# Patient Record
Sex: Female | Born: 1940
Health system: Southern US, Community
[De-identification: ages and names within clinical notes are randomized; demographics above are authoritative.]

## PROBLEM LIST (undated history)

## (undated) DIAGNOSIS — I82401 Acute embolism and thrombosis of unspecified deep veins of right lower extremity: Secondary | ICD-10-CM

## (undated) DIAGNOSIS — I2699 Other pulmonary embolism without acute cor pulmonale: Secondary | ICD-10-CM

## (undated) DIAGNOSIS — D6852 Prothrombin gene mutation: Secondary | ICD-10-CM

## (undated) DIAGNOSIS — Z8719 Personal history of other diseases of the digestive system: Secondary | ICD-10-CM

## (undated) DIAGNOSIS — I10 Essential (primary) hypertension: Secondary | ICD-10-CM

## (undated) DIAGNOSIS — D649 Anemia, unspecified: Secondary | ICD-10-CM

## (undated) DIAGNOSIS — I639 Cerebral infarction, unspecified: Secondary | ICD-10-CM

## (undated) DIAGNOSIS — R0600 Dyspnea, unspecified: Secondary | ICD-10-CM

## (undated) DIAGNOSIS — I739 Peripheral vascular disease, unspecified: Secondary | ICD-10-CM

## (undated) DIAGNOSIS — I509 Heart failure, unspecified: Secondary | ICD-10-CM

## (undated) DIAGNOSIS — K219 Gastro-esophageal reflux disease without esophagitis: Secondary | ICD-10-CM

## (undated) DIAGNOSIS — N189 Chronic kidney disease, unspecified: Secondary | ICD-10-CM

## (undated) DIAGNOSIS — M1990 Unspecified osteoarthritis, unspecified site: Secondary | ICD-10-CM

## (undated) DIAGNOSIS — J189 Pneumonia, unspecified organism: Secondary | ICD-10-CM

## (undated) DIAGNOSIS — D500 Iron deficiency anemia secondary to blood loss (chronic): Secondary | ICD-10-CM

## (undated) DIAGNOSIS — G510 Bell's palsy: Secondary | ICD-10-CM

## (undated) DIAGNOSIS — G2581 Restless legs syndrome: Secondary | ICD-10-CM

## (undated) HISTORY — DX: Iron deficiency anemia secondary to blood loss (chronic): D50.0

## (undated) HISTORY — DX: Prothrombin gene mutation: D68.52

## (undated) HISTORY — PX: COLONOSCOPY: SHX174

## (undated) HISTORY — PX: BACK SURGERY: SHX140

## (undated) HISTORY — PX: SHOULDER SURGERY: SHX246

## (undated) HISTORY — DX: Acute embolism and thrombosis of unspecified deep veins of right lower extremity: I82.401

---
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
## 1999-08-24 ENCOUNTER — Encounter: Payer: Self-pay | Admitting: Family Medicine

## 1999-08-24 ENCOUNTER — Encounter: Admission: RE | Admit: 1999-08-24 | Discharge: 1999-08-24 | Payer: Self-pay | Admitting: Family Medicine

## 1999-10-18 ENCOUNTER — Ambulatory Visit (HOSPITAL_COMMUNITY): Admission: RE | Admit: 1999-10-18 | Discharge: 1999-10-18 | Payer: Self-pay | Admitting: *Deleted

## 1999-10-18 ENCOUNTER — Encounter (INDEPENDENT_AMBULATORY_CARE_PROVIDER_SITE_OTHER): Payer: Self-pay | Admitting: Specialist

## 1999-11-19 ENCOUNTER — Encounter (HOSPITAL_COMMUNITY): Admission: RE | Admit: 1999-11-19 | Discharge: 2000-02-17 | Payer: Self-pay | Admitting: *Deleted

## 1999-12-12 ENCOUNTER — Encounter: Admission: RE | Admit: 1999-12-12 | Discharge: 1999-12-12 | Payer: Self-pay | Admitting: Obstetrics and Gynecology

## 1999-12-12 ENCOUNTER — Encounter: Payer: Self-pay | Admitting: Obstetrics and Gynecology

## 2000-01-08 ENCOUNTER — Encounter: Admission: RE | Admit: 2000-01-08 | Discharge: 2000-04-07 | Payer: Self-pay | Admitting: *Deleted

## 2000-02-25 ENCOUNTER — Encounter (HOSPITAL_COMMUNITY): Admission: RE | Admit: 2000-02-25 | Discharge: 2000-05-25 | Payer: Self-pay | Admitting: *Deleted

## 2001-04-06 ENCOUNTER — Encounter: Admission: RE | Admit: 2001-04-06 | Discharge: 2001-04-06 | Payer: Self-pay | Admitting: Obstetrics and Gynecology

## 2001-04-06 ENCOUNTER — Encounter: Payer: Self-pay | Admitting: Obstetrics and Gynecology

## 2001-07-13 ENCOUNTER — Ambulatory Visit (HOSPITAL_COMMUNITY): Admission: RE | Admit: 2001-07-13 | Discharge: 2001-07-13 | Payer: Self-pay | Admitting: Family Medicine

## 2001-07-13 ENCOUNTER — Encounter: Payer: Self-pay | Admitting: Family Medicine

## 2002-08-08 ENCOUNTER — Encounter: Admission: RE | Admit: 2002-08-08 | Discharge: 2002-08-08 | Payer: Self-pay | Admitting: Orthopedic Surgery

## 2002-08-08 ENCOUNTER — Encounter: Payer: Self-pay | Admitting: Orthopedic Surgery

## 2003-04-29 HISTORY — PX: CARDIAC CATHETERIZATION: SHX172

## 2003-12-20 ENCOUNTER — Ambulatory Visit (HOSPITAL_COMMUNITY): Admission: RE | Admit: 2003-12-20 | Discharge: 2003-12-20 | Payer: Self-pay | Admitting: Hematology & Oncology

## 2004-01-12 ENCOUNTER — Encounter: Admission: RE | Admit: 2004-01-12 | Discharge: 2004-01-12 | Payer: Self-pay | Admitting: Obstetrics and Gynecology

## 2004-01-17 ENCOUNTER — Encounter: Admission: RE | Admit: 2004-01-17 | Discharge: 2004-01-17 | Payer: Self-pay | Admitting: Obstetrics and Gynecology

## 2004-02-02 ENCOUNTER — Ambulatory Visit (HOSPITAL_COMMUNITY): Admission: RE | Admit: 2004-02-02 | Discharge: 2004-02-02 | Payer: Self-pay | Admitting: Neurosurgery

## 2004-02-08 ENCOUNTER — Ambulatory Visit (HOSPITAL_COMMUNITY): Admission: RE | Admit: 2004-02-08 | Discharge: 2004-02-08 | Payer: Self-pay | Admitting: Neurosurgery

## 2004-06-12 ENCOUNTER — Ambulatory Visit: Payer: Self-pay | Admitting: Hematology & Oncology

## 2004-06-17 ENCOUNTER — Inpatient Hospital Stay (HOSPITAL_COMMUNITY): Admission: RE | Admit: 2004-06-17 | Discharge: 2004-06-19 | Payer: Self-pay | Admitting: Neurosurgery

## 2004-12-11 ENCOUNTER — Ambulatory Visit: Payer: Self-pay | Admitting: Hematology & Oncology

## 2005-01-20 ENCOUNTER — Encounter: Admission: RE | Admit: 2005-01-20 | Discharge: 2005-01-20 | Payer: Self-pay | Admitting: Obstetrics and Gynecology

## 2005-01-29 ENCOUNTER — Ambulatory Visit: Payer: Self-pay | Admitting: Hematology & Oncology

## 2005-04-28 DIAGNOSIS — I2699 Other pulmonary embolism without acute cor pulmonale: Secondary | ICD-10-CM

## 2005-04-28 HISTORY — DX: Other pulmonary embolism without acute cor pulmonale: I26.99

## 2005-06-11 ENCOUNTER — Ambulatory Visit: Payer: Self-pay | Admitting: Hematology & Oncology

## 2005-12-10 ENCOUNTER — Ambulatory Visit: Payer: Self-pay | Admitting: Hematology & Oncology

## 2005-12-15 LAB — CBC WITH DIFFERENTIAL/PLATELET
Eosinophils Absolute: 0.2 10*3/uL (ref 0.0–0.5)
HGB: 13.5 g/dL (ref 11.6–15.9)
MCV: 91.5 fL (ref 81.0–101.0)
MONO%: 10.3 % (ref 0.0–13.0)
NEUT#: 2.3 10*3/uL (ref 1.5–6.5)
RBC: 4.28 10*6/uL (ref 3.70–5.32)
RDW: 13 % (ref 11.3–14.5)
WBC: 4.3 10*3/uL (ref 3.9–10.0)
lymph#: 1.3 10*3/uL (ref 0.9–3.3)

## 2005-12-15 LAB — FERRITIN: Ferritin: 56 ng/mL (ref 10–291)

## 2005-12-24 LAB — CBC WITH DIFFERENTIAL/PLATELET
BASO%: 1.4 % (ref 0.0–2.0)
Eosinophils Absolute: 0.3 10*3/uL (ref 0.0–0.5)
HCT: 39.7 % (ref 34.8–46.6)
MCHC: 34.7 g/dL (ref 32.0–36.0)
MONO#: 0.5 10*3/uL (ref 0.1–0.9)
NEUT#: 2.8 10*3/uL (ref 1.5–6.5)
NEUT%: 50.4 % (ref 39.6–76.8)
RBC: 4.31 10*6/uL (ref 3.70–5.32)
WBC: 5.5 10*3/uL (ref 3.9–10.0)
lymph#: 1.8 10*3/uL (ref 0.9–3.3)

## 2005-12-31 LAB — CBC WITH DIFFERENTIAL/PLATELET
Basophils Absolute: 0 10*3/uL (ref 0.0–0.1)
HCT: 32.9 % — ABNORMAL LOW (ref 34.8–46.6)
HGB: 11.5 g/dL — ABNORMAL LOW (ref 11.6–15.9)
MONO#: 0.4 10*3/uL (ref 0.1–0.9)
NEUT%: 55.3 % (ref 39.6–76.8)
WBC: 5.1 10*3/uL (ref 3.9–10.0)
lymph#: 1.6 10*3/uL (ref 0.9–3.3)

## 2006-01-22 ENCOUNTER — Encounter: Admission: RE | Admit: 2006-01-22 | Discharge: 2006-01-22 | Payer: Self-pay | Admitting: Obstetrics and Gynecology

## 2006-06-10 ENCOUNTER — Ambulatory Visit: Payer: Self-pay | Admitting: Hematology & Oncology

## 2006-06-15 LAB — CBC WITH DIFFERENTIAL/PLATELET
Basophils Absolute: 0.1 10*3/uL (ref 0.0–0.1)
Eosinophils Absolute: 0.6 10*3/uL — ABNORMAL HIGH (ref 0.0–0.5)
HCT: 38.8 % (ref 34.8–46.6)
HGB: 13.6 g/dL (ref 11.6–15.9)
LYMPH%: 15.9 % (ref 14.0–48.0)
MCHC: 35 g/dL (ref 32.0–36.0)
MONO#: 0.7 10*3/uL (ref 0.1–0.9)
NEUT#: 4.6 10*3/uL (ref 1.5–6.5)
NEUT%: 64.3 % (ref 39.6–76.8)
Platelets: 151 10*3/uL (ref 145–400)
WBC: 7.1 10*3/uL (ref 3.9–10.0)

## 2006-12-10 ENCOUNTER — Ambulatory Visit: Payer: Self-pay | Admitting: Hematology & Oncology

## 2006-12-14 LAB — CBC WITH DIFFERENTIAL/PLATELET
Basophils Absolute: 0 10*3/uL (ref 0.0–0.1)
EOS%: 3.4 % (ref 0.0–7.0)
Eosinophils Absolute: 0.2 10*3/uL (ref 0.0–0.5)
HCT: 32.1 % — ABNORMAL LOW (ref 34.8–46.6)
HGB: 11.1 g/dL — ABNORMAL LOW (ref 11.6–15.9)
MCH: 31.3 pg (ref 26.0–34.0)
MCV: 90.5 fL (ref 81.0–101.0)
MONO%: 12.7 % (ref 0.0–13.0)
NEUT%: 52.1 % (ref 39.6–76.8)
Platelets: 170 10*3/uL (ref 145–400)

## 2007-02-01 ENCOUNTER — Encounter: Admission: RE | Admit: 2007-02-01 | Discharge: 2007-02-01 | Payer: Self-pay | Admitting: Obstetrics and Gynecology

## 2007-02-08 ENCOUNTER — Encounter: Admission: RE | Admit: 2007-02-08 | Discharge: 2007-02-08 | Payer: Self-pay | Admitting: Obstetrics and Gynecology

## 2007-06-10 ENCOUNTER — Ambulatory Visit: Payer: Self-pay | Admitting: Hematology & Oncology

## 2007-06-14 LAB — CBC WITH DIFFERENTIAL/PLATELET
BASO%: 1 % (ref 0.0–2.0)
EOS%: 6.5 % (ref 0.0–7.0)
HCT: 37.6 % (ref 34.8–46.6)
LYMPH%: 32.4 % (ref 14.0–48.0)
MCH: 29.2 pg (ref 26.0–34.0)
MCHC: 32.5 g/dL (ref 32.0–36.0)
MONO%: 11.2 % (ref 0.0–13.0)
NEUT%: 48.9 % (ref 39.6–76.8)
Platelets: 171 10*3/uL (ref 145–400)

## 2007-06-14 LAB — HEPATIC FUNCTION PANEL
ALT: 12 U/L (ref 0–35)
Indirect Bilirubin: 0.3 mg/dL (ref 0.0–0.9)
Total Protein: 6.6 g/dL (ref 6.0–8.3)

## 2007-06-14 LAB — FERRITIN: Ferritin: 33 ng/mL (ref 10–291)

## 2007-06-14 LAB — AFP TUMOR MARKER: AFP-Tumor Marker: 1.7 ng/mL (ref 0.0–8.0)

## 2007-09-09 ENCOUNTER — Ambulatory Visit: Payer: Self-pay | Admitting: Hematology & Oncology

## 2007-09-16 ENCOUNTER — Encounter: Admission: RE | Admit: 2007-09-16 | Discharge: 2007-09-16 | Payer: Self-pay | Admitting: Orthopedic Surgery

## 2007-12-08 ENCOUNTER — Ambulatory Visit (HOSPITAL_BASED_OUTPATIENT_CLINIC_OR_DEPARTMENT_OTHER): Admission: RE | Admit: 2007-12-08 | Discharge: 2007-12-08 | Payer: Self-pay | Admitting: Emergency Medicine

## 2007-12-09 ENCOUNTER — Ambulatory Visit: Payer: Self-pay | Admitting: Hematology & Oncology

## 2007-12-13 LAB — CBC WITH DIFFERENTIAL (CANCER CENTER ONLY)
Eosinophils Absolute: 0.2 10*3/uL (ref 0.0–0.5)
HCT: 34.8 % (ref 34.8–46.6)
HGB: 11.8 g/dL (ref 11.6–15.9)
LYMPH%: 29.6 % (ref 14.0–48.0)
MCV: 89 fL (ref 81–101)
MONO#: 0.4 10*3/uL (ref 0.1–0.9)
NEUT%: 55.7 % (ref 39.6–80.0)
RBC: 3.92 10*6/uL (ref 3.70–5.32)
WBC: 4.1 10*3/uL (ref 3.9–10.0)

## 2007-12-13 LAB — IRON AND TIBC
Iron: 76 ug/dL (ref 42–145)
UIBC: 175 ug/dL

## 2007-12-13 LAB — FERRITIN: Ferritin: 77 ng/mL (ref 10–291)

## 2007-12-15 LAB — LUPUS ANTICOAGULANT PANEL
DRVVT 1:1 Mix: 40.7 secs (ref 36.1–47.0)
DRVVT: 66.2 secs — ABNORMAL HIGH (ref 36.1–47.0)
PTT Lupus Anticoagulant: 65.3 secs — ABNORMAL HIGH (ref 36.3–48.8)
PTTLA 4:1 Mix: 45.3 secs (ref 36.3–48.8)

## 2007-12-15 LAB — CARDIOLIPIN ANTIBODIES, IGG, IGM, IGA
Anticardiolipin IgA: 9 [APL'U] (ref <13)
Anticardiolipin IgG: <7 [GPL'U] (ref <11)
Anticardiolipin IgM: <7 [MPL'U] (ref <10)

## 2008-03-03 ENCOUNTER — Ambulatory Visit: Payer: Self-pay | Admitting: Hematology & Oncology

## 2008-03-06 LAB — FERRITIN: Ferritin: 45 ng/mL (ref 10–291)

## 2008-05-17 ENCOUNTER — Ambulatory Visit (HOSPITAL_COMMUNITY): Admission: RE | Admit: 2008-05-17 | Discharge: 2008-05-17 | Payer: Self-pay | Admitting: Family Medicine

## 2008-05-24 ENCOUNTER — Encounter: Admission: RE | Admit: 2008-05-24 | Discharge: 2008-05-24 | Payer: Self-pay | Admitting: Family Medicine

## 2008-06-14 ENCOUNTER — Ambulatory Visit: Payer: Self-pay | Admitting: Hematology & Oncology

## 2008-06-15 LAB — PROTIME-INR (CHCC SATELLITE)
INR: 2 (ref 2.0–3.5)
Protime: 24 Seconds — ABNORMAL HIGH (ref 10.6–13.4)

## 2008-06-15 LAB — CBC WITH DIFFERENTIAL (CANCER CENTER ONLY)
Eosinophils Absolute: 0.2 10*3/uL (ref 0.0–0.5)
LYMPH#: 1.5 10*3/uL (ref 0.9–3.3)
LYMPH%: 42.9 % (ref 14.0–48.0)
MCV: 86 fL (ref 81–101)
MONO#: 0.3 10*3/uL (ref 0.1–0.9)
NEUT#: 1.5 10*3/uL (ref 1.5–6.5)
Platelets: 171 10*3/uL (ref 145–400)
RBC: 4.27 10*6/uL (ref 3.70–5.32)
WBC: 3.4 10*3/uL — ABNORMAL LOW (ref 3.9–10.0)

## 2008-06-15 LAB — IRON AND TIBC
%SAT: 56 % — ABNORMAL HIGH (ref 20–55)
Iron: 157 ug/dL — ABNORMAL HIGH (ref 42–145)
UIBC: 121 ug/dL

## 2008-09-05 ENCOUNTER — Encounter: Admission: RE | Admit: 2008-09-05 | Discharge: 2008-09-19 | Payer: Self-pay | Admitting: Orthopedic Surgery

## 2008-11-07 ENCOUNTER — Encounter: Admission: RE | Admit: 2008-11-07 | Discharge: 2008-11-07 | Payer: Self-pay | Admitting: Family Medicine

## 2008-12-06 ENCOUNTER — Ambulatory Visit: Payer: Self-pay | Admitting: Hematology & Oncology

## 2008-12-07 LAB — CBC WITH DIFFERENTIAL (CANCER CENTER ONLY)
Eosinophils Absolute: 0.2 10*3/uL (ref 0.0–0.5)
MCH: 29.8 pg (ref 26.0–34.0)
MONO%: 6.4 % (ref 0.0–13.0)
NEUT#: 2.5 10*3/uL (ref 1.5–6.5)
Platelets: 146 10*3/uL (ref 145–400)
RBC: 4.14 10*6/uL (ref 3.70–5.32)
WBC: 4.4 10*3/uL (ref 3.9–10.0)

## 2008-12-07 LAB — PROTIME-INR (CHCC SATELLITE)
INR: 2.7 (ref 2.0–3.5)
Protime: 32.4 Seconds — ABNORMAL HIGH (ref 10.6–13.4)

## 2008-12-19 ENCOUNTER — Encounter: Admission: RE | Admit: 2008-12-19 | Discharge: 2008-12-19 | Payer: Self-pay | Admitting: Orthopedic Surgery

## 2009-01-12 ENCOUNTER — Ambulatory Visit: Payer: Self-pay | Admitting: Hematology & Oncology

## 2009-01-18 LAB — CBC WITH DIFFERENTIAL (CANCER CENTER ONLY)
Eosinophils Absolute: 0.4 10*3/uL (ref 0.0–0.5)
HCT: 32.1 % — ABNORMAL LOW (ref 34.8–46.6)
LYMPH%: 32.9 % (ref 14.0–48.0)
MCH: 29.3 pg (ref 26.0–34.0)
MCV: 87 fL (ref 81–101)
MONO%: 10.2 % (ref 0.0–13.0)
NEUT%: 49.1 % (ref 39.6–80.0)
Platelets: 247 10*3/uL (ref 145–400)
RBC: 3.67 10*6/uL — ABNORMAL LOW (ref 3.70–5.32)
RDW: 12 % (ref 10.5–14.6)

## 2009-01-18 LAB — PROTIME-INR (CHCC SATELLITE)
INR: 1.4 — ABNORMAL LOW (ref 2.0–3.5)
Protime: 16.8 Seconds — ABNORMAL HIGH (ref 10.6–13.4)

## 2009-05-23 ENCOUNTER — Encounter: Admission: RE | Admit: 2009-05-23 | Discharge: 2009-05-23 | Payer: Self-pay | Admitting: Family Medicine

## 2009-06-06 ENCOUNTER — Ambulatory Visit: Payer: Self-pay | Admitting: Hematology & Oncology

## 2009-06-07 LAB — CBC WITH DIFFERENTIAL (CANCER CENTER ONLY)
BASO#: 0 10*3/uL (ref 0.0–0.2)
BASO%: 0.4 % (ref 0.0–2.0)
Eosinophils Absolute: 0.2 10*3/uL (ref 0.0–0.5)
LYMPH#: 1.3 10*3/uL (ref 0.9–3.3)
MCHC: 33.6 g/dL (ref 32.0–36.0)
MCV: 85 fL (ref 81–101)
MONO%: 7.8 % (ref 0.0–13.0)
WBC: 3.5 10*3/uL — ABNORMAL LOW (ref 3.9–10.0)

## 2009-06-08 LAB — COMPREHENSIVE METABOLIC PANEL
ALT: 14 U/L (ref 0–35)
AST: 23 U/L (ref 0–37)
Alkaline Phosphatase: 108 U/L (ref 39–117)
CO2: 25 mEq/L (ref 19–32)
Glucose, Bld: 79 mg/dL (ref 70–99)
Sodium: 137 mEq/L (ref 135–145)
Total Bilirubin: 0.5 mg/dL (ref 0.3–1.2)

## 2009-06-08 LAB — FERRITIN: Ferritin: 23 ng/mL (ref 10–291)

## 2009-06-08 LAB — VITAMIN D 25 HYDROXY (VIT D DEFICIENCY, FRACTURES): Vit D, 25-Hydroxy: 29 ng/mL — ABNORMAL LOW (ref 30–89)

## 2009-09-18 ENCOUNTER — Ambulatory Visit (HOSPITAL_COMMUNITY): Admission: RE | Admit: 2009-09-18 | Discharge: 2009-09-18 | Payer: Self-pay | Admitting: Neurosurgery

## 2010-01-01 ENCOUNTER — Ambulatory Visit: Payer: Self-pay | Admitting: Hematology & Oncology

## 2010-01-03 LAB — CBC WITH DIFFERENTIAL (CANCER CENTER ONLY)
BASO%: 0.4 % (ref 0.0–2.0)
Eosinophils Absolute: 0.2 10*3/uL (ref 0.0–0.5)
LYMPH#: 1.6 10*3/uL (ref 0.9–3.3)
LYMPH%: 44.4 % (ref 14.0–48.0)
MCHC: 33.6 g/dL (ref 32.0–36.0)
MCV: 89 fL (ref 81–101)
MONO#: 0.4 10*3/uL (ref 0.1–0.9)
MONO%: 10.3 % (ref 0.0–13.0)
NEUT#: 1.4 10*3/uL — ABNORMAL LOW (ref 1.5–6.5)
NEUT%: 39.5 % — ABNORMAL LOW (ref 39.6–80.0)
RBC: 3.89 10*6/uL (ref 3.70–5.32)
WBC: 3.6 10*3/uL — ABNORMAL LOW (ref 3.9–10.0)

## 2010-01-03 LAB — IRON AND TIBC
Iron: 83 ug/dL (ref 42–145)
UIBC: 199 ug/dL

## 2010-01-03 LAB — AFP TUMOR MARKER: AFP-Tumor Marker: <1.3 ng/mL (ref 0.0–8.0)

## 2010-01-03 LAB — FERRITIN: Ferritin: 33 ng/mL (ref 10–291)

## 2010-01-10 ENCOUNTER — Encounter: Admission: RE | Admit: 2010-01-10 | Discharge: 2010-01-10 | Payer: Self-pay | Admitting: Family Medicine

## 2010-05-18 ENCOUNTER — Other Ambulatory Visit: Payer: Self-pay | Admitting: Family Medicine

## 2010-05-18 DIAGNOSIS — Z1239 Encounter for other screening for malignant neoplasm of breast: Secondary | ICD-10-CM

## 2010-05-18 DIAGNOSIS — Z1231 Encounter for screening mammogram for malignant neoplasm of breast: Secondary | ICD-10-CM

## 2010-05-19 ENCOUNTER — Encounter: Payer: Self-pay | Admitting: Obstetrics and Gynecology

## 2010-06-06 ENCOUNTER — Ambulatory Visit
Admission: RE | Admit: 2010-06-06 | Discharge: 2010-06-06 | Disposition: A | Discharge status: Home / Self Care | Payer: MEDICARE | Source: Ambulatory Visit | Attending: Family Medicine | Admitting: Family Medicine

## 2010-06-06 DIAGNOSIS — Z1231 Encounter for screening mammogram for malignant neoplasm of breast: Secondary | ICD-10-CM

## 2010-08-07 ENCOUNTER — Other Ambulatory Visit: Payer: Self-pay | Admitting: Family

## 2010-08-07 ENCOUNTER — Encounter: Payer: MEDICARE | Admitting: Hematology & Oncology

## 2010-08-07 DIAGNOSIS — Z7901 Long term (current) use of anticoagulants: Secondary | ICD-10-CM

## 2010-08-07 DIAGNOSIS — D6859 Other primary thrombophilia: Secondary | ICD-10-CM

## 2010-08-07 LAB — CBC WITH DIFFERENTIAL (CANCER CENTER ONLY)
BASO#: 0 10*3/uL (ref 0.0–0.2)
BASO%: 0.7 % (ref 0.0–2.0)
EOS%: 5.4 % (ref 0.0–7.0)
HCT: 36.9 % (ref 34.8–46.6)
HGB: 12.5 g/dL (ref 11.6–15.9)
LYMPH%: 30.1 % (ref 14.0–48.0)
MCHC: 33.9 g/dL (ref 32.0–36.0)
MCV: 85 fL (ref 81–101)
MONO%: 11.6 % (ref 0.0–13.0)
NEUT%: 52.2 % (ref 39.6–80.0)
RBC: 4.35 10*6/uL (ref 3.70–5.32)
WBC: 4.5 10*3/uL (ref 3.9–10.0)

## 2010-08-07 LAB — FERRITIN: Ferritin: 41 ng/mL (ref 10–291)

## 2010-08-07 LAB — AFP TUMOR MARKER: AFP-Tumor Marker: <1.3 ng/mL (ref 0.0–8.0)

## 2010-08-07 LAB — IRON AND TIBC
%SAT: 49 % (ref 20–55)
Iron: 135 ug/dL (ref 42–145)

## 2010-09-13 NOTE — Op Note (Signed)
[name], [name]               ACCOUNT NO.:  [id number]   MEDICAL RECORD NO.:  [id number]          PATIENT TYPE:  INP   LOCATION:  2899                         FACILITY:  [facility]   PHYSICIAN:  Cristi Loron, M.D.DATE OF BIRTH:  [birth year]   DATE OF PROCEDURE:  06/17/2004  DATE OF DISCHARGE:                                 OPERATIVE REPORT   INDICATIONS FOR PROCEDURE:  The patient is a 71 year old white female who  has suffered from back and right hip pain.  She has failed medical  management and was worked up with a lumbar MRI, as well as a lumbar  myelogram CT which demonstrated that the patient had spinal stenosis at L2-3  and L4-5.  I discussed the various treatment options with the patient,  including surgery.  The patient has weighed the risks, benefits and  alternatives to surgery and would like to proceed with an L3 and L4  laminectomy.   PREOPERATIVE DIAGNOSES:  1.  L3-4 and L4-5 multi-factorial spinal stenosis.  2.  Lumbago.  3.  Lumbar radiculopathy.   POSTOPERATIVE DIAGNOSES:  1.  L3-4 and L4-5 multi-factorial spinal stenosis.  2.  Lumbago.  3.  Lumbar radiculopathy.   PROCEDURE:  L3 and L4 laminectomy with bilateral foraminotomies using micro-  dissection.   SURGEON:  Cristi Loron, M.D.   ASSISTANT:  None.   ANESTHESIA:  General endotracheal.   ESTIMATED BLOOD LOSS:  Was 100 mL.   SPECIMENS:  None.   DRAINS:  None.   COMPLICATIONS:  None.   DESCRIPTION OF PROCEDURE:  The patient was brought to the operating room by  the anesthesia team.  General endotracheal anesthesia was induced.  The  patient was turned to the prone position on a Wilson frame.  The lumbosacral  region was then prepared with Betadine scrub and Betadine solution, and  sterile drapes were applied.  Then I injected the area to be incised with  Marcaine with epinephrine solution.  I used a scalpel to make a linear  midline incision over the L3-4 and L4-5 interspace.  I used  electrocautery  to perform a subperiosteal dissection, exposing the spinous process and  lamina of L3, L4 and L5.  I obtained an intraoperative radiograph to confirm  our location.   We inserted the cerebellar retractors for exposure.  Then used a scalpel to  incise the L2-3, L3-4 and L4-5 interspinous ligaments.  We then used a  Leksell rongeur to remove the interspinous ligament and the spinous process  of L3 and L4.  We then used a high-speed drill to perform a bilateral L3 and  L4 laminotomy.  We brought the operative microscope into the field, and  under its magnification and illumination completed the micro-dissection.  Using micro-dissection to free up the thecal sac and the nerve roots from  the epidural tissue, and then used a Kerrison punch to remove the excess  ligament of flavum from the lateral recesses at L3-4 and L4-5.  I did this  bilaterally and performed a foraminotomy about the bilateral L4 and L5 nerve  roots.  I then palpated  along the ventral surface of the thecal sac and  inspected the inter-vertebral disk at L3-4 and L4-5 bilaterally.  I noted  that the disk was bulging, but there was no herniation, and the thecal sac  and the bilateral L4 and L5 nerve roots were well-decompressed.  We obtained  stringent hemostasis using bipolar electrocautery and copiously irrigated  the wound out with Bacitracin solution, removed the solution, and then  removed the cerebellar retractor.  We reapproximated the patient's  thoracolumbar fascia with interrupted #1 Vicryl suture, the subcutaneous  tissues with interrupted #2-0 Vicryl suture, and the skin with Steri-Strips  and Benzoin.  The wound was then coated with Bacitracin ointment.  A sterile  dressing was applied.  The drapes were removed.   The patient was subsequently returned to a supine position where she was  extubated by the anesthesia team and transported to the post-anesthesia care  unit in stable condition.  All  sponge, instrument and needle counts were  correct at the end of this case.      JDJ/MEDQ  D:  06/17/2004  T:  06/17/2004  Job:  [id number]

## 2010-11-24 ENCOUNTER — Emergency Department (HOSPITAL_COMMUNITY): Payer: Medicare Other

## 2010-11-24 ENCOUNTER — Inpatient Hospital Stay (HOSPITAL_COMMUNITY)
Admission: EM | Admit: 2010-11-24 | Discharge: 2010-11-27 | DRG: 683 | Disposition: A | Discharge status: Home / Self Care | Payer: Medicare Other | Attending: Internal Medicine | Admitting: Internal Medicine

## 2010-11-24 DIAGNOSIS — F3289 Other specified depressive episodes: Secondary | ICD-10-CM | POA: Diagnosis present

## 2010-11-24 DIAGNOSIS — G2581 Restless legs syndrome: Secondary | ICD-10-CM | POA: Diagnosis present

## 2010-11-24 DIAGNOSIS — Z7901 Long term (current) use of anticoagulants: Secondary | ICD-10-CM

## 2010-11-24 DIAGNOSIS — E870 Hyperosmolality and hypernatremia: Secondary | ICD-10-CM | POA: Diagnosis present

## 2010-11-24 DIAGNOSIS — R197 Diarrhea, unspecified: Secondary | ICD-10-CM | POA: Diagnosis present

## 2010-11-24 DIAGNOSIS — N179 Acute kidney failure, unspecified: Principal | ICD-10-CM | POA: Diagnosis present

## 2010-11-24 DIAGNOSIS — E869 Volume depletion, unspecified: Secondary | ICD-10-CM | POA: Diagnosis present

## 2010-11-24 DIAGNOSIS — I959 Hypotension, unspecified: Secondary | ICD-10-CM | POA: Diagnosis present

## 2010-11-24 DIAGNOSIS — I82891 Chronic embolism and thrombosis of other specified veins: Secondary | ICD-10-CM | POA: Diagnosis present

## 2010-11-24 DIAGNOSIS — N200 Calculus of kidney: Secondary | ICD-10-CM | POA: Diagnosis present

## 2010-11-24 DIAGNOSIS — F329 Major depressive disorder, single episode, unspecified: Secondary | ICD-10-CM | POA: Diagnosis present

## 2010-11-24 DIAGNOSIS — D649 Anemia, unspecified: Secondary | ICD-10-CM | POA: Diagnosis present

## 2010-11-24 LAB — CBC
HCT: 33.9 % — ABNORMAL LOW (ref 36.0–46.0)
Hemoglobin: 10.7 g/dL — ABNORMAL LOW (ref 12.0–15.0)
MCH: 30.1 pg (ref 26.0–34.0)
MCHC: 33.9 g/dL (ref 30.0–36.0)
MCHC: 33.5 g/dL (ref 30.0–36.0)
MCV: 88.7 fL (ref 78.0–100.0)
Platelets: 143 10*3/uL — ABNORMAL LOW (ref 150–400)
Platelets: 128 10*3/uL — ABNORMAL LOW (ref 150–400)
RDW: 14.3 % (ref 11.5–15.5)

## 2010-11-24 LAB — URINALYSIS, ROUTINE W REFLEX MICROSCOPIC
Hgb urine dipstick: NEGATIVE
Ketones, ur: 15 mg/dL — AB
Nitrite: NEGATIVE
Urobilinogen, UA: 0.2 mg/dL (ref 0.0–1.0)

## 2010-11-24 LAB — BASIC METABOLIC PANEL
BUN: 78 mg/dL — ABNORMAL HIGH (ref 6–23)
Calcium: 8.8 mg/dL (ref 8.4–10.5)
Calcium: 8.1 mg/dL — ABNORMAL LOW (ref 8.4–10.5)
GFR calc Af Amer: 7 mL/min — ABNORMAL LOW (ref >60)
GFR calc Af Amer: 10 mL/min — ABNORMAL LOW (ref >60)
GFR calc non Af Amer: 6 mL/min — ABNORMAL LOW (ref >60)
GFR calc non Af Amer: 9 mL/min — ABNORMAL LOW (ref >60)
Glucose, Bld: 98 mg/dL (ref 70–99)
Potassium: 5.2 mEq/L — ABNORMAL HIGH (ref 3.5–5.1)
Potassium: 4.7 mEq/L (ref 3.5–5.1)
Sodium: 136 mEq/L (ref 135–145)
Sodium: 136 mEq/L (ref 135–145)

## 2010-11-24 LAB — MRSA PCR SCREENING: MRSA by PCR: NEGATIVE

## 2010-11-24 LAB — DIFFERENTIAL
Basophils Absolute: 0 10*3/uL (ref 0.0–0.1)
Basophils Relative: 0 % (ref 0–1)
Eosinophils Absolute: 0.3 10*3/uL (ref 0.0–0.7)
Eosinophils Absolute: 0.3 10*3/uL (ref 0.0–0.7)
Eosinophils Relative: 5 % (ref 0–5)
Lymphocytes Relative: 29 % (ref 12–46)
Lymphs Abs: 1.5 10*3/uL (ref 0.7–4.0)
Monocytes Absolute: 0.4 10*3/uL (ref 0.1–1.0)
Monocytes Absolute: 0.5 10*3/uL (ref 0.1–1.0)
Monocytes Relative: 8 % (ref 3–12)
Neutro Abs: 2.3 10*3/uL (ref 1.7–7.7)
Neutrophils Relative %: 46 % (ref 43–77)

## 2010-11-24 LAB — PROTEIN / CREATININE RATIO, URINE: Creatinine, Urine: 311.99 mg/dL

## 2010-11-24 LAB — MAGNESIUM: Magnesium: 2.6 mg/dL — ABNORMAL HIGH (ref 1.5–2.5)

## 2010-11-24 LAB — PHOSPHORUS: Phosphorus: 6.7 mg/dL — ABNORMAL HIGH (ref 2.3–4.6)

## 2010-11-25 LAB — BASIC METABOLIC PANEL
CO2: 23 mEq/L (ref 19–32)
Calcium: 7.5 mg/dL — ABNORMAL LOW (ref 8.4–10.5)
Chloride: 108 mEq/L (ref 96–112)
Creatinine, Ser: 3.69 mg/dL — ABNORMAL HIGH (ref 0.50–1.10)
Glucose, Bld: 96 mg/dL (ref 70–99)

## 2010-11-25 LAB — PHOSPHORUS: Phosphorus: 4.9 mg/dL — ABNORMAL HIGH (ref 2.3–4.6)

## 2010-11-25 LAB — CBC
Hemoglobin: 9.9 g/dL — ABNORMAL LOW (ref 12.0–15.0)
MCH: 30 pg (ref 26.0–34.0)
MCV: 89.4 fL (ref 78.0–100.0)
Platelets: 114 10*3/uL — ABNORMAL LOW (ref 150–400)
RBC: 3.3 MIL/uL — ABNORMAL LOW (ref 3.87–5.11)
WBC: 4.2 10*3/uL (ref 4.0–10.5)

## 2010-11-25 LAB — DIFFERENTIAL
Lymphocytes Relative: 41 % (ref 12–46)
Lymphs Abs: 1.7 10*3/uL (ref 0.7–4.0)
Monocytes Relative: 9 % (ref 3–12)
Neutro Abs: 1.9 10*3/uL (ref 1.7–7.7)
Neutrophils Relative %: 45 % (ref 43–77)

## 2010-11-25 LAB — URINE CULTURE
Colony Count: NO GROWTH
Culture  Setup Time: 201207291758

## 2010-11-25 LAB — MAGNESIUM: Magnesium: 2.3 mg/dL (ref 1.5–2.5)

## 2010-11-25 LAB — HEMOGLOBIN A1C: Mean Plasma Glucose: 120 mg/dL — ABNORMAL HIGH (ref <117)

## 2010-11-25 NOTE — H&P (Signed)
[name], [name]               ACCOUNT NO.:  [id number]  MEDICAL RECORD NO.:  [id number]  LOCATION:  [facility]                         FACILITY:  [facility]  PHYSICIAN:  Pleas Koch, MD        DATE OF BIRTH:  September 30, 1940  DATE OF ADMISSION:  11/24/2010 DATE OF DISCHARGE:                             HISTORY & PHYSICAL   PRIMARY CARE PHYSICIAN:  Dr. Holley Bouche.  CHIEF COMPLAINT: 1. Poor passage of urine. 2. Blurred vision and vague dizzy complaints. 3. Recent history of diarrhea in the past 3-4 days.  HISTORY OF PRESENT ILLNESS:  This is a very pleasant 70-year Caucasian female who presents with difficulty passing urine for the past 1-2 days. She states that this is not a new complaint and usually she has to sit toilet for short period of time before she passes urine, was placed what she thinks is hydrochlorothiazide for this by her primary care physician, but then describes over the past 2 days she has passed less than urine despite copious oral intake of fluids in an attempt to do the same.  She does not the patient passed urine at all yesterday.  She has no abdominal pain.  She states that does not any real sensation of fullness, but became concerned enough with this symptom as well as others to come to the hospital.  She also states that she is felt semi-headed on Thursday when she was working in her yard, probably because of heat she felt dizzy and she states that she has had some blurred vision and double vision where she sees a lines of her book and cannot really focus on the lines of the book.  She has had some chills she thinks a couple of weeks ago but no definitive fever.  She has no shortness of breath.  No chest pain.  She does not think she had been contacted.  She just feels woozy not really dizzy.  She admits to having diarrhea for the past 3 days ranges from formed, from semi soft and formed copious to liquidy diarrhea and no specific really foul smell.  She has  not been exposed antibiotics or but she is unclear she has been exposed to any sick people.  She ate at a restaurant last night and then had watery diarrhea.  She had fistula.  PAST MEDICAL HISTORY:  Significant for hemochromatosis, restless legs syndrome, she had a DVT about 3 years ago subsequent to a foot surgery and was on Coumadin for year.  Her hemochromatosis as well as DVT issues followed by Dr. Myna Hidalgo and she was on Coumadin for about a year.  PAST SURGICAL HISTORY:  She had back surgery maybe about 6 years ago.  FAMILY HISTORY:  Her mother had CHF and died age of 50, 3 years ago dad had pancreatic carcinoma likely related to hemochromatosis in that age of 61.  Her sister has significant diabetes mellitus and underwent a BKA.  Her grandfather had a heart attack.  She also carries a history of hypertension as well as mitral valve prolapse.  ALLERGIES:  She is allergic to PENICILLIN, SULFA, and MORPHINE. Penicillin and sulfa makes her sore throat.  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  Her husband name is, Elmer, cell phone number [phone].  She does not have any advanced directives and when questioned about the same she is not clear.  Right now (she is a full code).  PHYSICAL EXAMINATION:  VITAL SIGNS:  Initially temperature 97.5 in the ED, blood pressure was initially 83/41, then went to 98/51, pulse is 54, respirations 20, and O2 sats 98% on room air.  She is not on any pain. GENERAL:  She is a very pleasant Caucasian female, looking younger than stated age. HEENT:  Pupils are dilated.  I am not able to appreciate fundus.  She has no pallor.  No icterus.  I would do appreciate mild venous distention bilaterally. CHEST:  Clinically clear.  No tactile vocal resonance or fremitus.  S1 and S2.  No murmurs, rubs, possible murmur at the left sternal edge.  No PMI displaced. ABDOMEN:  Slightly obese, nontender, nondistended.  Bowel sounds  are heard. EXTREMITIES:  Soft, nonswollen, nontender. NEUROLOGIC:  She is grossly intact.  Her gait was not assessed.  WORKUP IN THE ED: 1. Urinalysis/CT of the head for the dizziness which showed no acute     intracranial abnormality.  A tiny of lacunar infarct in the left     thalamus.  CBC on November 24, 2010 which showed, her CBG showed a     white count 5.3, hemoglobin 11.5, hematocrit 33.9, platelet count     143.  Her BEMT showed, a sodium 136, potassium 5.2, chloride 97,     CO2 of 22, glucose 105, BUN 78, creatinine 6.62, and calcium 8.8.  ASSESSMENT AND PLAN: 1. This is a 70 year old female with possible new onset acute kidney     injury likely multifactorial secondary to possible use of blood     pressure medications in the setting of diarrhea which may be     secondary to volume depletion, although she does not seem that way     clinically.  The other possibility of course is that she has     urinary obstruction as evidenced by the fact that she put out about     300 mL of urine in the ED after being catheterized after 24-hour     period of time.  She definitely oligo and anuric and I do believe     further imaging is warranted.  I will get a Renal ultrasound of the     kidneys to assess for possible obstructive disease and if need be I     will also consult Urology for this.  We will give her copious     volume at 250 mL/ hr.  The patient does not have any history of CHF     and hopefully her urine will pick up.  We will repeat labs in the     morning.  I have done limited workup including a spot urine for     protein creatinine, urine sodium as well as urine osmolality to     determine the urine and determine what type of renal insufficiency     she has and we will review her in the morning. 2. Hypotension, this is likely secondary to volume depletion but this     could be also secondary to possible septic picture, although the     patient is afebrile as such as her vitals  are not hemodynamically     completely stable.  I will  keep her on the Step-Down Unit today and     review her in the morning.  I have had an EKG done today which     shows a heart rate above 54, PR interval of about 0.12, axis of     about 45 good R-wave progression across precordial leads and no     peaked T-waves.  I got this EKG because she had mild hyperkalemia. 3  Hyperkalemia, likely secondary to acute renal insufficiency, we will get Kayexalate onboard 15 g which I thought.emergency department physician's orders stat.  She will continue this.  I will repeat another BMET in about 6 hours and review her kidney function at that time we will make a determination of need for urology to been involved.  1. Anemia, unclear what the etiology of this is, if her hemoglobin     dropped down further although it is only mildly decreased.  We will     get an anemia panel, in addition that she also has a Myoview low     platelet count for which we will keep on SCDs. 2. Questionable diabetes mellitus.  She has a fasting glucose 105 and     we will get HbA1c in the morning.  I have discussed completely the course care with the patient and we will determine on day-to-day basis.  She does I will update family as needed.          ______________________________ Pleas Koch, MD     JS/MEDQ  D:  11/24/2010  T:  11/24/2010  Job:  [id number]  cc:   Holley Bouche, M.D.  Electronically Signed by Pleas Koch MD on 11/25/2010 10:19:56 PM

## 2010-11-26 LAB — BASIC METABOLIC PANEL
BUN: 32 mg/dL — ABNORMAL HIGH (ref 6–23)
CO2: 26 mEq/L (ref 19–32)
Chloride: 109 mEq/L (ref 96–112)
Glucose, Bld: 86 mg/dL (ref 70–99)
Potassium: 4.9 mEq/L (ref 3.5–5.1)
Sodium: 141 mEq/L (ref 135–145)

## 2010-11-26 LAB — COMPREHENSIVE METABOLIC PANEL
AST: 17 U/L (ref 0–37)
Albumin: 2.8 g/dL — ABNORMAL LOW (ref 3.5–5.2)
BUN: 38 mg/dL — ABNORMAL HIGH (ref 6–23)
Creatinine, Ser: 1.44 mg/dL — ABNORMAL HIGH (ref 0.50–1.10)
Total Protein: 5.8 g/dL — ABNORMAL LOW (ref 6.0–8.3)

## 2010-11-26 LAB — CBC
HCT: 30.2 % — ABNORMAL LOW (ref 36.0–46.0)
MCHC: 33.4 g/dL (ref 30.0–36.0)
MCV: 89.6 fL (ref 78.0–100.0)
Platelets: 107 10*3/uL — ABNORMAL LOW (ref 150–400)
RDW: 14.2 % (ref 11.5–15.5)
WBC: 3.3 10*3/uL — ABNORMAL LOW (ref 4.0–10.5)

## 2010-11-26 NOTE — Discharge Summary (Signed)
[name], [name]               ACCOUNT NO.:  [id number]  MEDICAL RECORD NO.:  [id number]  LOCATION:  6705                         FACILITY:  [facility]  PHYSICIAN:  Pleas Koch, MD        DATE OF BIRTH:  [birth year]  DATE OF ADMISSION:  11/24/2010 DATE OF DISCHARGE:                              DISCHARGE SUMMARY   DISCHARGE DIAGNOSES: 1. Likely prerenal azotemia secondary to low blood pressure versus     likely prerenal azotemia secondary to increased sweating. 2. Vague episodes of blurred vision, now resolved. 3. Hypotension, likely contributory to the acute kidney injury and     secondary to volume depletion, antihypertensives currently on hold. 4. Anemia, history of hemochromatosis follows up with Dr. Myna Hidalgo. 5. History of prior deep vein thrombosis, now on Coumadin. 6. A1c of 5.8. 7. Hyperkalemia, cause undefined and likely secondary to prerenal     azotemia.  DISCHARGE MEDICATIONS:  These will be reconciled by discharging physician may include, 1. Amlodipine 5 daily. 2. Alprazolam 0.25 daily. 3. Calcium carbonate 1 tab daily. 4. Citalopram 20 at bedtime. 5. Pantoprazole 40 q.12 h. 6. Gabapentin 75 daily. 7. Ropinirole 4 mg daily. 8. Temazepam 30 at bedtime. 9. Vitamin E 100 b.i.d.  Please note, I have held her metoprolol and her lisinopril pending primary care physician reassessment.  IMAGING DATA:  Renal ultrasound showing small stone lower pole of the left kidney, otherwise normal exam.  CT head showing no acute intracranial abnormality with tiny old lacunar infarct left thalamus.  CONSULTATIONS:  Mark C. Vernie Ammons, M.D. with Urology.  Please see my full dictation [phone] on admission.  HISTORY OF PRESENT ILLNESS:  This is briefly a 70 year old female complaining of difficulty passing urine who describes oliguria, anuria, and passing less urine despite copious intake of fluids.  She also felt lightheaded working in the garden, felt slightly dizzy and  blurred vision, double vision despite being seen recently by her optometrist who gave her the all clear.  No shortness breath or chest pain.  No other issues.  Having diarrhea for couple of days, which has now resolved.  HOSPITAL COURSE: 1. AKI, likely secondary to volume depletion.  Acute kidney injury was     likely multifactorial.  The FENA pointed to this being prerenal.     She is now passing copious amounts of fluids and over the past 24     hours, has put out about 1200 mL.  I will discontinue IV fluids if     not done ready.  Continue good p.o. intake and the patient can be     reviewed by rounding physician, Dr. Jomarie Longs tomorrow.  If the     patient's potassium is normalized and her creatinine goes down to     her baseline which is down to baseline below 1, I feel it would be     reasonable to send her home.  Certainly, her creatinine has gone     from 6.6 on admission to currently 1.44. 2. Hyperkalemia, cause undefined.  Her hyperkalemia is mild and     despite and we have given her 1 one dose of Kayexalate.  On  telemonitor, she is currently off monitors.  However, I will place     her on monitors to make sure there are no other issues.  I will get     an EKG just to make sure there is no hyperkalemia and we will     review her in the morning.  I feel it is reasonable to repeat the     potassium and if her potassium is normalized, we can likely     discharge her home in the morning.  Certainly, this could be     because of transcellular shift, so decrease GFR likely secondary to     the oliguric or anuric ATI that she experienced. 3. Hypotension.  We will hold completely her metoprolol and     lisinopril.  Her blood pressure is acceptable at this time,     currently on Norvasc which will be continued until outpatient     physician sees her. 4. Restless legs syndrome.  She is requesting her usual dose of     ropinirole 4 and is currently on 3.  We will accept that and      reconcile those orders. 5. Depression.  She will continue on her citalopram. 6. Anemia and history of chromatosis.  The patient can follow with Dr.     Myna Hidalgo for the same.  Her hemoglobin has trended slightly down     from 11.5 to 9.9, however, remained stable today at 10.1.  No     further workup is needed at this point.  Her platelets are in the     range of 100-115, but that is normal for her.  The patient was seen today, was doing well, had complete resolution of blurred vision.  No nausea, no vomiting, has been getting up and going to the bathroom without issue.  PHYSICAL EXAMINATION:  CHEST:  Clinically clear. ABDOMEN:  Soft.  S1, S2.  No murmurs. EXTREMITIES:  No lower extremity edema.  Please see discharge dictation.  It was a pleasure taking care of your patient.          ______________________________ Pleas Koch, MD     JS/MEDQ  D:  11/26/2010  T:  11/26/2010  Job:  [id number]  cc:   Holley Bouche, M.D.  Electronically Signed by Pleas Koch MD on 11/26/2010 10:22:46 PM

## 2010-11-27 LAB — RENAL FUNCTION PANEL
Albumin: 2.9 g/dL — ABNORMAL LOW (ref 3.5–5.2)
BUN: 26 mg/dL — ABNORMAL HIGH (ref 6–23)
CO2: 26 mEq/L (ref 19–32)
Chloride: 109 mEq/L (ref 96–112)
Glucose, Bld: 82 mg/dL (ref 70–99)
Potassium: 5.1 mEq/L (ref 3.5–5.1)

## 2010-12-01 NOTE — Consult Note (Signed)
[name], [name]               ACCOUNT NO.:  [id number]  MEDICAL RECORD NO.:  [id number]  LOCATION:                                 FACILITY:  PHYSICIAN:  Monicka Cyran C. Vernie Ammons, M.D.  DATE OF BIRTH:  28-Sep-1940  DATE OF CONSULTATION:  11/25/2010 DATE OF DISCHARGE:                                CONSULTATION   [name] is a 70 year old female who I was asked to see regarding possible urinary retention.  The patient states that a little over 48 hours ago, on Friday, she was out picking potatoes.  It was extremely hot and she said she is a heavy sweater and said she sweated so much that all of her clothing was soaked.  She recalls urinating that evening, but the following morning, on Saturday, she noted that she did not urinate like she normally does in the morning and did not urinate throughout the day.  The following day, on Sunday, she was presented to the emergency room because she felt she was not urinating and presented for further evaluation.  She was not complaining of any suprapubic discomfort or flank pain at that time.  She does report that she had some diarrhea with some abdominal cramping as well.  She then became lightheaded.  She is on a beta-blocker and has no significant past urologic history, specifically she has no history of urinary retention or any form of voiding dysfunction.  She has had UTIs in the past, but those were rare and the last one was over a year ago.  She also has a history of a renal calculus disease but has not had difficulty with that for a number of years.  She presented to the emergency room where a Foley catheter was placed and it was recorded that 200-300 mL of dark amber urine returned.  She was found to have an elevated creatinine of 6.63 and admitted to the hospital.  I was contacted regarding possible urinary retention.  PAST MEDICAL HISTORY:  Positive for: 1. Hypertension. 2. Past history of pulmonary embolism. 3. DVT. 4. Spinal  stenosis. 5. Hemochromatosis. 6. Calculus disease.  PAST SURGICAL HISTORY:  She has had back surgery.  MEDICATIONS:  On admission, propranolol, Prilosec, amlodipine, Lyrica, ropinirole, citalopram, omeprazole, temazepam, alprazolam, calcium, vitamin E, and multivitamins.  ALLERGIES:  PENICILLIN and SULFA.  SOCIAL HISTORY:  She does not smoke or drink.  FAMILY HISTORY:  Negative for GU malignancy or renal disease.  REVIEW OF SYSTEMS:  As noted above, otherwise, her 13-point review of systems is negative.  PHYSICAL EXAMINATION:  VITAL SIGNS:  Her blood pressure is 103/46, pulse 76, respirations 18.  She is afebrile. HEENT:  Atraumatic, normocephalic.  Oropharynx clear. NECK:  Supple with midline trachea. CHEST:  Regular rate and rhythm. ABDOMEN:  Soft and nontender without mass or HSM.  She has no CVAT. SKIN:  Warm and dry. EXTREMITIES:  Without clubbing, cyanosis, or edema. NEUROLOGIC:  She has no gross focal neurologic deficits and specifically no lower extremity weakness or numbness noted.  She is alert, oriented with appropriate mood and affect.  LABORATORY RESULTS ON ADMISSION:  Her urinalysis had a specific gravity of 1.024.  White count  was 5.3, hemoglobin 11.5, hematocrit 33.9. Sodium 136, potassium 5.2, BUN was 78, creatinine 6.63.  Renal ultrasound images were independently reviewed.  The kidneys are free of any mass and specifically, no hydronephrosis noted.  There is a hyperechoic area in the lower pole of the left kidney consistent with a stone with a posterior shadowing.  I reviewed his CT scan done in May 2011, which revealed a punctate right renal stone in the lower pole with no evidence of obstruction and a stone in the lower pole of the left kidney that measured approximately 3 mm in size.  Reviewing her previous CT scans, I note there is no change in the 3-mm stone in the lower pole of her left kidney compared to a CT scan done in 2006.  IMPRESSION: 1.  No evidence whatsoever of urinary retention.  The 200-300 mL in her     bladder is normal and physiologic in a patient with significant     dehydration.  She appears to have a combination of both some renal     but more likely prerenal azotemia.  She is responding to fluid     resuscitation.  Her urine output is picking up.  Her creatinine is     improving and is now down to 3.69.  I do not feel further Foley     catheterization is necessary at this time. 2. Bilateral nephrolithiasis.  These have been stable over the years     and no specific therapy is necessary.  RECOMMENDATIONS: 1. Discontinue Foley catheter at this time. 2. Continue treatment of her azotemia with fluids and monitoring     electrolytes. 3. Please contact me if further urologic assistance is needed,     otherwise, I will sign off.     Yanel Dombrosky C. Vernie Ammons, M.D.     MCO/MEDQ  D:  11/25/2010  T:  11/25/2010  Job:  [id number]  Electronically Signed by Ihor Gully M.D. on 12/01/2010 05:13:05 PM

## 2011-01-08 ENCOUNTER — Encounter (HOSPITAL_BASED_OUTPATIENT_CLINIC_OR_DEPARTMENT_OTHER): Payer: Medicare Other | Admitting: Hematology & Oncology

## 2011-01-08 ENCOUNTER — Other Ambulatory Visit: Payer: Self-pay | Admitting: Hematology & Oncology

## 2011-01-08 LAB — COMPREHENSIVE METABOLIC PANEL
ALT: 14 U/L (ref 0–35)
AST: 24 U/L (ref 0–37)
Alkaline Phosphatase: 97 U/L (ref 39–117)
Calcium: 9.7 mg/dL (ref 8.4–10.5)
Chloride: 100 mEq/L (ref 96–112)
Creatinine, Ser: 1.19 mg/dL — ABNORMAL HIGH (ref 0.50–1.10)
Total Bilirubin: 0.4 mg/dL (ref 0.3–1.2)

## 2011-01-08 LAB — CBC WITH DIFFERENTIAL (CANCER CENTER ONLY)
BASO#: 0 10*3/uL (ref 0.0–0.2)
BASO%: 0.6 % (ref 0.0–2.0)
EOS%: 4.6 % (ref 0.0–7.0)
HCT: 35 % (ref 34.8–46.6)
HGB: 12.3 g/dL (ref 11.6–15.9)
LYMPH%: 31.7 % (ref 14.0–48.0)
MCHC: 35.1 g/dL (ref 32.0–36.0)
MCV: 87 fL (ref 81–101)
MONO#: 0.5 10*3/uL (ref 0.1–0.9)
NEUT%: 52 % (ref 39.6–80.0)
RDW: 13.1 % (ref 11.1–15.7)

## 2011-01-08 LAB — IRON AND TIBC
%SAT: 26 % (ref 20–55)
TIBC: 266 ug/dL (ref 250–470)
UIBC: 197 ug/dL (ref 125–400)

## 2011-01-24 LAB — PROTIME-INR
INR: 3 — ABNORMAL HIGH
Prothrombin Time: 33.3 — ABNORMAL HIGH

## 2011-07-02 ENCOUNTER — Telehealth: Payer: Self-pay | Admitting: Hematology & Oncology

## 2011-07-02 NOTE — Telephone Encounter (Signed)
Pt moved 3-20 to 4-15 she could only come on monday or thursday.

## 2011-07-16 ENCOUNTER — Other Ambulatory Visit: Payer: Medicare Other | Admitting: Lab

## 2011-07-16 ENCOUNTER — Ambulatory Visit: Payer: Medicare Other | Admitting: Hematology & Oncology

## 2011-08-11 ENCOUNTER — Other Ambulatory Visit (HOSPITAL_BASED_OUTPATIENT_CLINIC_OR_DEPARTMENT_OTHER): Payer: Medicare Other | Admitting: Lab

## 2011-08-11 ENCOUNTER — Ambulatory Visit (HOSPITAL_BASED_OUTPATIENT_CLINIC_OR_DEPARTMENT_OTHER): Payer: Medicare Other | Admitting: Hematology & Oncology

## 2011-08-11 DIAGNOSIS — I82409 Acute embolism and thrombosis of unspecified deep veins of unspecified lower extremity: Secondary | ICD-10-CM

## 2011-08-11 DIAGNOSIS — R52 Pain, unspecified: Secondary | ICD-10-CM

## 2011-08-11 DIAGNOSIS — M7989 Other specified soft tissue disorders: Secondary | ICD-10-CM

## 2011-08-11 DIAGNOSIS — Z86718 Personal history of other venous thrombosis and embolism: Secondary | ICD-10-CM

## 2011-08-11 LAB — CBC WITH DIFFERENTIAL (CANCER CENTER ONLY)
BASO#: 0 10*3/uL (ref 0.0–0.2)
Eosinophils Absolute: 0.2 10*3/uL (ref 0.0–0.5)
HGB: 12.8 g/dL (ref 11.6–15.9)
MONO#: 0.4 10*3/uL (ref 0.1–0.9)
NEUT#: 1.5 10*3/uL (ref 1.5–6.5)
Platelets: 168 10*3/uL (ref 145–400)
RBC: 4.36 10*6/uL (ref 3.70–5.32)
WBC: 3.4 10*3/uL — ABNORMAL LOW (ref 3.9–10.0)

## 2011-08-11 NOTE — Progress Notes (Signed)
This office note has been dictated.

## 2011-08-12 NOTE — Progress Notes (Signed)
CC:   Holley Bouche, M.D.  DIAGNOSES: 1. 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 2. History of DVT, prothrombin II gene mutation positive.  CURRENT THERAPY:  Phlebotomy to maintain ferritin less than 100.  INTERVAL HISTORY:  [name] comes in for followup.  We see her every 6 months.  Since we last saw her, she has been doing okay.  She has had issues with her kidneys.  She was admitted, I think, back last July actually with renal failure.  This was from dehydration.  She does have pain issues.  She has had back surgery.  She has had some occasional leg swelling.  She has had no obvious bleeding.  She has had no fever.  When we last saw her in September, her ferritin was only 30 with an iron saturation of 26%.  She has had no obvious change in her medications.  PHYSICAL EXAM:  This is a well-developed, well-nourished white female in no obvious distress.  Vital signs:  96.8, pulse 67, respiratory rate 20, blood pressure 117/71.  Weight is 162.  Head and neck exam shows a normocephalic, atraumatic skull.  There are no ocular or oral lesions. There are no palpable cervical or supraclavicular lymph nodes.  Lungs: Clear to percussion and auscultation bilaterally.  Cardiac: Regular rate and rhythm with a normal S1 and S2.  There are no murmurs, rubs or bruits.  Abdomen:  Soft with good bowel sounds.  There is no palpable abdominal mass.  There is no palpable hepatosplenomegaly. Back:  No tenderness over the spine, ribs, or hips.  She does have a lumbar laminectomy scar that is well healed.  Extremities:  Some trace nonpitting edema in her legs.  Neurologic:  No focal neurological deficits.  LABORATORY STUDIES:  White cell count is 3.4, hemoglobin 12.8, hematocrit 37.6, platelet count 168.  MCV is 86.  IMPRESSION:  [name] is a 71 year old white female with history of hemochromatosis.  Again, she is homozygous for the major C282T mutation. We have not had to  phlebotomize since I have been seeing her.  I have been seeing her now for probably about 8 years.  We will continue to monitor her. We see her every 6 months.  I do not see need for any kind of x-ray tests right now.  Her last mammogram was done back in February 2012.  She says she is going to have another 1 done soon.  I will see her back in 6 months.  We last checked her alpha-fetoprotein back in April 2012.  It was less than 1.3.    ______________________________ Josph Macho, M.D. PRE/MEDQ  D:  08/11/2011  T:  08/12/2011  Job:  [id number]

## 2011-08-15 LAB — COMPREHENSIVE METABOLIC PANEL
Albumin: 4 g/dL (ref 3.5–5.2)
CO2: 31 mEq/L (ref 19–32)
Calcium: 9.8 mg/dL (ref 8.4–10.5)
Chloride: 100 mEq/L (ref 96–112)
Glucose, Bld: 91 mg/dL (ref 70–99)
Potassium: 3.4 mEq/L — ABNORMAL LOW (ref 3.5–5.3)
Sodium: 141 mEq/L (ref 135–145)
Total Protein: 6.9 g/dL (ref 6.0–8.3)

## 2011-08-15 LAB — HEMOCHROMATOSIS DNA-PCR(C282Y,H63D)

## 2011-08-15 LAB — IRON AND TIBC
Iron: 61 ug/dL (ref 42–145)
UIBC: 198 ug/dL (ref 125–400)

## 2011-08-20 ENCOUNTER — Telehealth: Payer: Self-pay | Admitting: *Deleted

## 2011-08-20 NOTE — Telephone Encounter (Signed)
Message copied by Anselm Jungling on Wed Aug 20, 2011  2:39 PM ------      Message from: Arlan Organ R      Created: Wed Aug 13, 2011  2:05 PM       Call - iron is still good!!!  No phlebotomy

## 2011-08-20 NOTE — Telephone Encounter (Signed)
Called patient to let her know that her iron levels were good per dr. Ennever. Left message on personal answering machine. 

## 2011-08-22 ENCOUNTER — Telehealth: Payer: Self-pay | Admitting: *Deleted

## 2011-08-22 NOTE — Telephone Encounter (Addendum)
Message copied by Mirian Capuchin on Fri Aug 22, 2011 12:02 PM ------      Message from: Arlan Organ R      Created: Wed Aug 13, 2011  2:05 PM       Call - iron is still good!!!  No phlebotomy  Called patient and let her know that labs are good. ST

## 2012-01-29 ENCOUNTER — Other Ambulatory Visit (HOSPITAL_BASED_OUTPATIENT_CLINIC_OR_DEPARTMENT_OTHER): Payer: Medicare Other | Admitting: Lab

## 2012-01-29 ENCOUNTER — Ambulatory Visit (HOSPITAL_BASED_OUTPATIENT_CLINIC_OR_DEPARTMENT_OTHER): Payer: Medicare Other | Admitting: Hematology & Oncology

## 2012-01-29 DIAGNOSIS — I82409 Acute embolism and thrombosis of unspecified deep veins of unspecified lower extremity: Secondary | ICD-10-CM

## 2012-01-29 DIAGNOSIS — D6852 Prothrombin gene mutation: Secondary | ICD-10-CM

## 2012-01-29 LAB — CBC WITH DIFFERENTIAL (CANCER CENTER ONLY)
BASO#: 0 10*3/uL (ref 0.0–0.2)
Eosinophils Absolute: 0.1 10*3/uL (ref 0.0–0.5)
HCT: 36.7 % (ref 34.8–46.6)
HGB: 12.5 g/dL (ref 11.6–15.9)
MCH: 29.7 pg (ref 26.0–34.0)
MONO%: 10.5 % (ref 0.0–13.0)
NEUT#: 3.3 10*3/uL (ref 1.5–6.5)
RBC: 4.21 10*6/uL (ref 3.70–5.32)

## 2012-01-29 LAB — IRON AND TIBC
%SAT: 17 % — ABNORMAL LOW (ref 20–55)
Iron: 40 ug/dL — ABNORMAL LOW (ref 42–145)
UIBC: 192 ug/dL (ref 125–400)

## 2012-01-29 NOTE — Progress Notes (Signed)
This office note has been dictated.

## 2012-01-30 NOTE — Progress Notes (Signed)
CC:   [name], M.D.  DIAGNOSES: 1. Hemochromatosis (homozygous for capital C282Y mutation). 2. History of a deep vein thrombosis, prothrombin 2 gene mutation.  CURRENT THERAPY:  Phlebotomy to maintain ferritin less than 100.  INTERIM HISTORY:  [name] comes in for followup.  When we saw her 6 months ago.  She has lost 24 pounds since we last saw her.  She has been trying to lose a little weight.  When we last saw her, her ferritin was only 39.  We have not had to phlebotomize her for quite awhile.  She has been having some problem with cramps in her legs.  She is on hydrochlorothiazide.  She was given potassium but is not taking it.  She still has some back discomfort.  This more so over in the I think left sacroiliac region.  She has not noted any problems with leg swelling.  She has had no nausea or vomiting.  There has been no cough or shortness of breath.  PHYSICAL EXAMINATION:  This is a well-developed, well-nourished white female in no obvious distress.  Vital signs:  Temperature of 97.7, pulse 54, respiratory rate 18, blood pressure 152/65.  Weight is 138.  Head and neck:  Normocephalic, atraumatic skull.  There are no ocular or oral lesions.  There are no palpable cervical or supraclavicular lymph nodes. Lungs:  Clear bilaterally.  Cardiac:  Regular rate and rhythm with a normal S1 and S2.  There are no murmurs, rubs or bruits.  Abdomen:  Soft with good bowel sounds.  There is no palpable abdominal mass.  There is no fluid wave.  No palpable hepatosplenomegaly.  Back:  No tenderness over the spine, ribs, or hips.  Extremities:  No clubbing, cyanosis or edema.  LABORATORY STUDIES:  White cell count is 5.3, hemoglobin 12.5, hematocrit 36.7, platelet count 174.  IMPRESSION:  [name] is a 71 year old female with hemochromatosis. Again, she is homozygous for the C282Y mutation.  I do not think that iron overload will ever be a problem for her.  We will  continue to get her back every 6 months.  We will call her with the results of her iron.   ______________________________ Josph Macho, M.D. PRE/MEDQ  D:  01/29/2012  T:  01/30/2012  Job:  [id number]

## 2012-02-16 ENCOUNTER — Telehealth: Payer: Self-pay | Admitting: Hematology & Oncology

## 2012-02-16 NOTE — Telephone Encounter (Addendum)
Message copied by Cathi Roan on Mon Feb 16, 2012  9:09 AM ------      Message from: [place], [place] N      Created: Wed Feb 04, 2012 12:03 PM                   ----- Message -----         From: Josph Macho, MD         Sent: 02/02/2012   7:22 AM           To: Baldomero Lamy, RN            Amy:            Call her and let her know that the iron is good.  Her ferritin is 54 so she does not need a phlebotomy.            Thanks!!!            Cindee Lame  02-16-12, 9:15   Called and spoke to  [place] on cell phone, informed her of above MD message. Information acknowledged by patient.  Lupita Raider LPN

## 2012-03-23 ENCOUNTER — Other Ambulatory Visit: Payer: Self-pay | Admitting: Family Medicine

## 2012-03-23 DIAGNOSIS — Z1231 Encounter for screening mammogram for malignant neoplasm of breast: Secondary | ICD-10-CM

## 2012-04-06 ENCOUNTER — Other Ambulatory Visit: Payer: Self-pay | Admitting: Family Medicine

## 2012-04-06 DIAGNOSIS — M545 Low back pain: Secondary | ICD-10-CM

## 2012-04-10 ENCOUNTER — Ambulatory Visit
Admission: RE | Admit: 2012-04-10 | Discharge: 2012-04-10 | Disposition: A | Discharge status: Home / Self Care | Payer: Medicare Other | Source: Ambulatory Visit | Attending: Family Medicine | Admitting: Family Medicine

## 2012-04-10 DIAGNOSIS — M545 Low back pain: Secondary | ICD-10-CM

## 2012-04-10 MED ORDER — GADOBENATE DIMEGLUMINE 529 MG/ML IV SOLN
13.0000 mL | Freq: Once | INTRAVENOUS | Status: AC | PRN
  Administered 2012-04-10: 13 mL via INTRAVENOUS

## 2012-04-12 ENCOUNTER — Ambulatory Visit: Payer: Medicare Other

## 2012-04-15 ENCOUNTER — Ambulatory Visit (HOSPITAL_COMMUNITY): Payer: Medicare Other

## 2012-05-04 ENCOUNTER — Ambulatory Visit (HOSPITAL_COMMUNITY): Payer: Medicare Other

## 2012-05-10 ENCOUNTER — Ambulatory Visit (HOSPITAL_COMMUNITY): Payer: Medicare Other

## 2012-05-18 ENCOUNTER — Ambulatory Visit (HOSPITAL_COMMUNITY)
Admission: RE | Admit: 2012-05-18 | Discharge: 2012-05-18 | Disposition: A | Discharge status: Home / Self Care | Payer: Medicare Other | Source: Ambulatory Visit | Attending: Family Medicine | Admitting: Family Medicine

## 2012-05-18 DIAGNOSIS — Z1231 Encounter for screening mammogram for malignant neoplasm of breast: Secondary | ICD-10-CM | POA: Insufficient documentation

## 2012-06-21 ENCOUNTER — Other Ambulatory Visit (HOSPITAL_COMMUNITY)
Admission: RE | Admit: 2012-06-21 | Discharge: 2012-06-21 | Disposition: A | Discharge status: Home / Self Care | Payer: Medicare Other | Source: Ambulatory Visit | Attending: Obstetrics and Gynecology | Admitting: Obstetrics and Gynecology

## 2012-06-21 ENCOUNTER — Other Ambulatory Visit: Payer: Self-pay | Admitting: Obstetrics and Gynecology

## 2012-06-21 DIAGNOSIS — Z124 Encounter for screening for malignant neoplasm of cervix: Secondary | ICD-10-CM | POA: Insufficient documentation

## 2012-06-21 DIAGNOSIS — Z1151 Encounter for screening for human papillomavirus (HPV): Secondary | ICD-10-CM | POA: Insufficient documentation

## 2012-07-29 ENCOUNTER — Ambulatory Visit (HOSPITAL_BASED_OUTPATIENT_CLINIC_OR_DEPARTMENT_OTHER): Payer: Medicare Other | Admitting: Medical

## 2012-07-29 ENCOUNTER — Other Ambulatory Visit (HOSPITAL_BASED_OUTPATIENT_CLINIC_OR_DEPARTMENT_OTHER): Payer: Medicare Other | Admitting: Lab

## 2012-07-29 DIAGNOSIS — Z86718 Personal history of other venous thrombosis and embolism: Secondary | ICD-10-CM

## 2012-07-29 DIAGNOSIS — I82409 Acute embolism and thrombosis of unspecified deep veins of unspecified lower extremity: Secondary | ICD-10-CM

## 2012-07-29 DIAGNOSIS — D6852 Prothrombin gene mutation: Secondary | ICD-10-CM

## 2012-07-29 LAB — IRON AND TIBC
%SAT: 24 % (ref 20–55)
Iron: 70 ug/dL (ref 42–145)
TIBC: 289 ug/dL (ref 250–470)

## 2012-07-29 LAB — CBC WITH DIFFERENTIAL (CANCER CENTER ONLY)
BASO%: 0.9 % (ref 0.0–2.0)
HCT: 37.1 % (ref 34.8–46.6)
LYMPH#: 1.9 10*3/uL (ref 0.9–3.3)
MONO#: 0.4 10*3/uL (ref 0.1–0.9)
Platelets: 189 10*3/uL (ref 145–400)
RDW: 15.2 % (ref 11.1–15.7)
WBC: 4.3 10*3/uL (ref 3.9–10.0)

## 2012-07-29 NOTE — Progress Notes (Signed)
DIAGNOSES: 1. Hemochromatosis (homozygous for capital C282Y mutation). 2. History of a deep vein thrombosis, prothrombin 2 gene mutation.  CURRENT THERAPY:  Phlebotomy to maintain ferritin less than 100.  INTERIM HISTORY: [name] presents today for an office followup visit.  Overall, she, reports, that she's doing relatively well.  She has joined [employer] again.  She, states she's gained some weight.  Back.  When we last saw her 6 months ago, her ferritin was only 54.  We'll like to keep her ferritin below 100.  She still continues to have some chronic issues with her back.  She, reports, that she saw all an orthopedic surgeon.  Last week, who informed her that she has 2 bulging discs, as well as scoliosis and disc degeneration.  She still able to get around quite well.  She, reports, a good appetite.  She denies any nausea, vomiting, diarrhea, constipation, chest pain, shortness breath, or cough she denies any fevers, chills, or night sweats, any lower leg swelling.  She denies any type of abdominal pain.  She denies any obvious, or normal, bleeding.  She denies any headaches, visual changes, or rashes.   Review of Systems: Constitutional:Negative for malaise/fatigue, fever, chills, weight loss, diaphoresis, activity change, appetite change, and unexpected weight change.  HEENT: Negative for double vision, blurred vision, visual loss, ear pain, tinnitus, congestion, rhinorrhea, epistaxis sore throat or sinus disease, oral pain/lesion, tongue soreness Respiratory: Negative for cough, chest tightness, shortness of breath, wheezing and stridor.  Cardiovascular: Negative for chest pain, palpitations, leg swelling, orthopnea, PND, DOE or claudication Gastrointestinal: Negative for nausea, vomiting, abdominal pain, diarrhea, constipation, blood in stool, melena, hematochezia, abdominal distention, anal bleeding, rectal pain, anorexia and hematemesis.  Genitourinary: Negative for dysuria,  frequency, hematuria,  Musculoskeletal: Negative for myalgias, back pain, joint swelling, arthralgias and gait problem.  Skin: Negative for rash, color change, pallor and wound.  Neurological:. Negative for dizziness/light-headedness, tremors, seizures, syncope, facial asymmetry, speech difficulty, weakness, numbness, headaches and paresthesias.  Hematological: Negative for adenopathy. Does not bruise/bleed easily.  Psychiatric/Behavioral:  Negative for depression, no loss of interest in normal activity or change in sleep pattern.   Physical Exam: This is a pleasant, 72 year old, well-developed, well-nourished, white female, in no obvious distress Vitals:`, Temperature 97.8 degrees, pulse 53, respirations 16, blood pressure 140/63, weight 153 pounds HEENT reveals a normocephalic, atraumatic skull, no scleral icterus, no oral lesions  Neck is supple without any cervical or supraclavicular adenopathy.  Lungs are clear to auscultation bilaterally. There are no wheezes, rales or rhonci Cardiac is regular rate and rhythm with a normal S1 and S2. There are no murmurs, rubs, or bruits.  Abdomen is soft with good bowel sounds, there is no palpable mass. There is no palpable hepatosplenomegaly. There is no palpable fluid wave.  Musculoskeletal no tenderness of the spine, ribs, or hips.  Extremities there are no clubbing, cyanosis, or edema.  Skin no petechia, purpura or ecchymosis Neurologic is nonfocal.  Laboratory Data: White count 0.3, hemoglobin 12.4, hematocrit 37.1, platelets 189,000  Current Outpatient Prescriptions on File Prior to Visit  Medication Sig Dispense Refill  . ALPRAZolam (XANAX) 0.25 MG tablet at bedtime as needed.       Marland Kitchen amLODipine (NORVASC) 5 MG tablet every morning.      . Calcium Polycarbophil (FIBERCON PO) Take by mouth 2 (two) times daily.      . calcium-vitamin D (OSCAL WITH D) 250-125 MG-UNIT per tablet Take 1 tablet by mouth 2 (two) times daily.      Marland Kitchen  cholecalciferol  (VITAMIN D) 1000 UNITS tablet Take 1,000 Units by mouth 2 (two) times daily before a meal.      . citalopram (CELEXA) 20 MG tablet daily after supper.       . cyclobenzaprine (FLEXERIL) 10 MG tablet As needed      . fish oil-omega-3 fatty acids 1000 MG capsule Take 2 g by mouth daily.      . hydrochlorothiazide (MICROZIDE) 12.5 MG capsule every morning.      Marland Kitchen omeprazole (PRILOSEC) 20 MG capsule every morning.      . propranolol (INDERAL) 10 MG tablet 2 (two) times daily.      . temazepam (RESTORIL) 30 MG capsule 30 mg at bedtime as needed.       . vitamin E 400 UNIT capsule Take 400 Units by mouth daily.       No current facility-administered medications on file prior to visit.  `  Assessment/Plan: This is a pleasant, 73 year old, white female, with the following issues:  #1.  Hemochromatosis.  Again, she is homozygous for the C282Y mutation.  She does not need a phlebotomy today.  Her ferritin level has remained below 100.  We have not had to worry about any type of iron overload.  We will continue to monitor her ferritin level.  #2.  Followup.  We will follow back up with Ms. Perrow in 6 months, but before then should there be questions or concerns.

## 2012-11-09 ENCOUNTER — Encounter (HOSPITAL_COMMUNITY): Payer: Self-pay | Admitting: Emergency Medicine

## 2012-11-09 ENCOUNTER — Emergency Department (HOSPITAL_COMMUNITY)
Admission: EM | Admit: 2012-11-09 | Discharge: 2012-11-09 | Disposition: A | Discharge status: Home / Self Care | Payer: Medicare Other | Attending: Emergency Medicine | Admitting: Emergency Medicine

## 2012-11-09 ENCOUNTER — Emergency Department (HOSPITAL_COMMUNITY): Payer: Medicare Other

## 2012-11-09 DIAGNOSIS — Z79899 Other long term (current) drug therapy: Secondary | ICD-10-CM | POA: Insufficient documentation

## 2012-11-09 DIAGNOSIS — W19XXXA Unspecified fall, initial encounter: Secondary | ICD-10-CM | POA: Insufficient documentation

## 2012-11-09 DIAGNOSIS — Y939 Activity, unspecified: Secondary | ICD-10-CM | POA: Insufficient documentation

## 2012-11-09 DIAGNOSIS — S99929A Unspecified injury of unspecified foot, initial encounter: Secondary | ICD-10-CM | POA: Insufficient documentation

## 2012-11-09 DIAGNOSIS — I10 Essential (primary) hypertension: Secondary | ICD-10-CM | POA: Insufficient documentation

## 2012-11-09 DIAGNOSIS — Z86718 Personal history of other venous thrombosis and embolism: Secondary | ICD-10-CM | POA: Insufficient documentation

## 2012-11-09 DIAGNOSIS — IMO0002 Reserved for concepts with insufficient information to code with codable children: Secondary | ICD-10-CM | POA: Insufficient documentation

## 2012-11-09 DIAGNOSIS — M67912 Unspecified disorder of synovium and tendon, left shoulder: Secondary | ICD-10-CM

## 2012-11-09 DIAGNOSIS — M719 Bursopathy, unspecified: Secondary | ICD-10-CM | POA: Insufficient documentation

## 2012-11-09 DIAGNOSIS — Y929 Unspecified place or not applicable: Secondary | ICD-10-CM | POA: Insufficient documentation

## 2012-11-09 DIAGNOSIS — Z862 Personal history of diseases of the blood and blood-forming organs and certain disorders involving the immune mechanism: Secondary | ICD-10-CM | POA: Insufficient documentation

## 2012-11-09 DIAGNOSIS — M67919 Unspecified disorder of synovium and tendon, unspecified shoulder: Secondary | ICD-10-CM | POA: Insufficient documentation

## 2012-11-09 DIAGNOSIS — S8990XA Unspecified injury of unspecified lower leg, initial encounter: Secondary | ICD-10-CM | POA: Insufficient documentation

## 2012-11-09 DIAGNOSIS — Z880 Allergy status to penicillin: Secondary | ICD-10-CM | POA: Insufficient documentation

## 2012-11-09 HISTORY — DX: Other pulmonary embolism without acute cor pulmonale: I26.99

## 2012-11-09 HISTORY — DX: Essential (primary) hypertension: I10

## 2012-11-09 HISTORY — DX: Hemochromatosis, unspecified: E83.119

## 2012-11-09 MED ORDER — HYDROCODONE-ACETAMINOPHEN 10-325 MG PO TABS: 1.0000 | ORAL_TABLET | Freq: Four times a day (QID) | ORAL | Status: DC | PRN

## 2012-11-09 MED ORDER — MELOXICAM 7.5 MG PO TABS: 7.5000 mg | ORAL_TABLET | Freq: Every day | ORAL | Status: DC

## 2012-11-09 MED ORDER — METHOCARBAMOL 500 MG PO TABS: 500.0000 mg | ORAL_TABLET | Freq: Four times a day (QID) | ORAL | Status: DC | PRN

## 2012-11-09 NOTE — ED Notes (Signed)
PT. REPORTS LEFT UPPER  BACK AND LEFT SHOULDER PAIN ONSET LAST Wednesday , FELL LAST SATURDAY AND INJURED HER LEFT SHIN , PT. STATED PAIN AT LEFT UPPER BACK AND LEFT SHOULDER WORSE WITH MOVEMENT /PALPATION . RESPIRATIONS UNLABORED ,AMBULATORY . HISTORY OF ARTHRITIS /PE.

## 2012-11-09 NOTE — ED Provider Notes (Signed)
History    CSN: [id number] Arrival date & time 11/09/12  0415  First MD Initiated Contact with Patient 11/09/12 0541     Chief Complaint  Patient presents with  . Back Pain  . Shoulder Pain   (Consider location/radiation/quality/duration/timing/severity/associated sxs/prior Treatment) Patient is a 72 y.o. female presenting with back pain and shoulder pain. The history is provided by the patient.  Back Pain Shoulder Pain  She has been having pain in her left scapular area for the last week. It is worse with movement of the left shoulder and she cannot raise her arm over her shoulder. Pain does radiate somewhat toward the neck. There is no associated weakness or numbness or tingling. She did not have any trauma prior to this but she did fall 3 days ago and injured her left shin. Of note, she has had bilateral rotator cuff surgery. She states that her pain is moderate to severe and she rated at 8/10. She has taken Percocet and Flexeril which have not relieved the pain but they did make her sleepy. In the past, she has taken Vicodin all without getting overly sleepy and it seemed to do a better job of pain control for her. Past Medical History  Diagnosis Date  . Hypertension   . PE (pulmonary embolism)   . Hemochromatosis    Past Surgical History  Procedure Laterality Date  . Back surgery    . Shoulder surgery    . Foot surgery     No family history on file. History  Substance Use Topics  . Smoking status: Never Smoker   . Smokeless tobacco: Not on file  . Alcohol Use: No   OB History   Grav Para Term Preterm Abortions TAB SAB Ect Mult Living                 Review of Systems  Musculoskeletal: Positive for back pain.  All other systems reviewed and are negative.    Allergies  Penicillins and Morphine and related  Home Medications   Current Outpatient Rx  Name  Route  Sig  Dispense  Refill  . amLODipine (NORVASC) 5 MG tablet   Oral   Take 5 mg by mouth every  morning.          . Biotin 5000 MCG TABS   Oral   Take 5,000 mcg by mouth every morning.          . Black Cohosh (REMIFEMIN) 20 MG TABS   Oral   Take 20 mg by mouth 2 (two) times daily.          . Calcium Polycarbophil (FIBERCON PO)   Oral   Take 1 tablet by mouth 2 (two) times daily.          . calcium-vitamin D (OSCAL WITH D) 250-125 MG-UNIT per tablet   Oral   Take 1 tablet by mouth 2 (two) times daily.         . cholecalciferol (VITAMIN D) 1000 UNITS tablet   Oral   Take 1,000 Units by mouth 2 (two) times daily before a meal.         . citalopram (CELEXA) 20 MG tablet   Oral   Take 20 mg by mouth daily after supper.          . cyclobenzaprine (FLEXERIL) 10 MG tablet   Oral   Take 10 mg by mouth 3 (three) times daily as needed. For muscle spasms         .  fish oil-omega-3 fatty acids 1000 MG capsule   Oral   Take 1 g by mouth daily.          . hydrochlorothiazide (MICROZIDE) 12.5 MG capsule      25 mg every morning.          . Multiple Vitamins-Minerals (CENTRUM SILVER PO)   Oral   Take 1 tablet by mouth every morning.          Marland Kitchen omeprazole (PRILOSEC) 20 MG capsule   Oral   Take 20 mg by mouth every morning.          Marland Kitchen OVER THE COUNTER MEDICATION   Oral   Take 1 tablet by mouth daily. Patient states otc medication like Requip         . oxyCODONE-acetaminophen (PERCOCET) 10-325 MG per tablet      1 tablet every 8 (eight) hours as needed.          . pramipexole (MIRAPEX) 0.5 MG tablet   Oral   Take 0.5 mg by mouth every evening.         . propranolol (INDERAL) 10 MG tablet   Oral   Take 10 mg by mouth 2 (two) times daily.          . temazepam (RESTORIL) 30 MG capsule   Oral   Take 30 mg by mouth at bedtime as needed for sleep.          . Turmeric Curcumin 500 MG CAPS   Oral   Take 500 mg by mouth at bedtime as needed.          . vitamin E 400 UNIT capsule   Oral   Take 400 Units by mouth daily.           BP 141/64  Pulse 61  Temp(Src) 99 F (37.2 C) (Oral)  Resp 14  SpO2 97% Physical Exam  Nursing note and vitals reviewed.  72 year old female, resting comfortably and in no acute distress. Vital signs are significant for borderline hypertension with blood pressure 141/64. Oxygen saturation is 97%, which is normal. Head is normocephalic and atraumatic. PERRLA, EOMI. Oropharynx is clear. Neck is nontender and supple without adenopathy or JVD. Back is nontender and there is no CVA tenderness. There is no tenderness over the left scapula and suprascapular area. Lungs are clear without rales, wheezes, or rhonchi. Chest is nontender. Heart has regular rate and rhythm without murmur. Abdomen is soft, flat, nontender without masses or hepatosplenomegaly and peristalsis is normoactive. Extremities have no cyanosis or edema, full range of motion is present. There is marked tenderness in the left anterior deltoid groove. Rotator cuff impingement signs are present. Distal neurovascular exam is intact with strong pulses, prompt capillary refill, normal sensation. Skin is warm and dry without rash. Neurologic: Mental status is normal, cranial nerves are intact, there are no motor or sensory deficits.  ED Course  Procedures (including critical care time) Labs Reviewed - No data to display Dg Shoulder Left  11/09/2012   *RADIOLOGY REPORT*  Clinical Data: Superior left shoulder pain.  LEFT SHOULDER - 2+ VIEW  Comparison: None.  Findings: There is no evidence of fracture or dislocation.  The left humeral head is seated within the glenoid fossa.  The acromioclavicular joint is unremarkable in appearance.  No significant soft tissue abnormalities are seen.  The visualized portions of the left lung are clear.  A small osseous body is noted inferior to the glenoid fossa.  IMPRESSION: No  evidence of fracture or dislocation.   Original Report Authenticated By: Tonia Ghent, M.D.   1. Rotator cuff disorder,  left     MDM  Left rotator cuff syndrome. She is not currently on any NSAIDs so prescription is given for meloxicam. She did not seem to be getting relief with cyclobenzaprine, so she will be given a prescription for methocarbamol to see if it gives her better relief. She was also switched from acetaminophen-oxycodone to hydrocodone-acetaminophen. She is referred to on-call orthopedics for followup.  Dione Booze, MD 11/09/12 [phone]

## 2013-01-24 ENCOUNTER — Telehealth: Payer: Self-pay | Admitting: Hematology & Oncology

## 2013-01-24 NOTE — Telephone Encounter (Signed)
Pt aware moved 10-2 to 11-4 °

## 2013-01-27 ENCOUNTER — Ambulatory Visit: Payer: Medicare Other | Admitting: Hematology & Oncology

## 2013-01-27 ENCOUNTER — Other Ambulatory Visit: Payer: Medicare Other | Admitting: Lab

## 2013-03-01 ENCOUNTER — Other Ambulatory Visit (HOSPITAL_BASED_OUTPATIENT_CLINIC_OR_DEPARTMENT_OTHER): Payer: Medicare Other | Admitting: Lab

## 2013-03-01 ENCOUNTER — Ambulatory Visit (HOSPITAL_BASED_OUTPATIENT_CLINIC_OR_DEPARTMENT_OTHER): Payer: Medicare Other | Admitting: Hematology & Oncology

## 2013-03-01 VITALS — BP 141/67 | HR 55 | Temp 98.2°F | Resp 14 | Ht 65.0 in | Wt 172.0 lb

## 2013-03-01 DIAGNOSIS — D72829 Elevated white blood cell count, unspecified: Secondary | ICD-10-CM

## 2013-03-01 DIAGNOSIS — D72819 Decreased white blood cell count, unspecified: Secondary | ICD-10-CM

## 2013-03-01 LAB — CBC WITH DIFFERENTIAL (CANCER CENTER ONLY)
BASO%: 0.5 % (ref 0.0–2.0)
EOS%: 4.6 % (ref 0.0–7.0)
LYMPH%: 32.7 % (ref 14.0–48.0)
MCHC: 32.5 g/dL (ref 32.0–36.0)
MCV: 86 fL (ref 81–101)
MONO#: 0.4 10*3/uL (ref 0.1–0.9)
MONO%: 11.3 % (ref 0.0–13.0)
Platelets: 174 10*3/uL (ref 145–400)
RDW: 14.4 % (ref 11.1–15.7)
WBC: 3.7 10*3/uL — ABNORMAL LOW (ref 3.9–10.0)

## 2013-03-01 LAB — IRON AND TIBC CHCC: UIBC: 224 ug/dL (ref 120–384)

## 2013-03-01 LAB — FERRITIN CHCC: Ferritin: 22 ng/ml (ref 9–269)

## 2013-03-01 NOTE — Progress Notes (Signed)
This office note has been dictated.

## 2013-03-02 NOTE — Progress Notes (Signed)
CC:   Holley Bouche, M.D. Cristi Loron, M.D.  DIAGNOSES: 1. Hemochromatosis (C282Y homozygous mutation). 2. Prothrombin II gene mutation with past deep venous thrombosis.  __________ phlebotomy to maintain ferritin less than 100.  INTERIM HISTORY:  Ms. Guilliams comes in for a followup.  Unfortunately, her big problem has been her spine.  She has real bad arthritis.  She is having problems with her right leg right now.  She sees Dr. Lovell Sheehan. She had an MRI I think done back a year ago in December.  The patient is having problems with her left leg.  This seemed to improve.  Otherwise, she seems to be doing okay.  Because of the back issues, she has not been able to exercise.  The patient continues on quite a few medications.  Her last ferritin was 35 back in April.  We did not phlebotomize her I think probably for good 3 or 4 years, if not longer.  PHYSICAL EXAMINATION:  General:  This is a well-developed, well- nourished white female, in no obvious distress.  Vital Signs: Temperature of 98.2, pulse 55, respiratory rate 14, blood pressure 141/67.  Weight is 172 pounds.  Head and Neck:  Normocephalic, atraumatic skull.  There are no ocular or oral lesions.  There are no palpable cervical or supraclavicular lymph nodes.  Lungs:  Clear bilaterally.  Cardiac:  Regular rate and rhythm with a normal S1 and S2. There are no murmurs, rubs, or bruits.  Abdomen:  Soft.  She has good bowel sounds.  There is no fluid wave.  There is no palpable abdominal mass.  No palpable hepatosplenomegaly.  Back:  No tenderness over the spine, ribs, or hips.  She has a well-healed laminectomy scar in the lumbar spine.  Extremities:  No clubbing, cyanosis, or edema.  She has good strength in her legs.  She has decent range of motion of her joints.  There are some osteoarthritic changes noted.  Skin:  No rashes, ecchymosis, or petechiae.  LABORATORY STUDIES:  White cell count is 3.7, hemoglobin  12.1, hematocrit 37.2, platelet count 174.  MCV is 86.  IMPRESSION:  Ms. Guinta is a very charming 72 year old white female with hemochromatosis.  From my point of view, this really has not been a problem.  Again, we have not phlebotomized her prior for about 3 or 4 years, if not longer.  I would not think that her ferritin would be too elevated at this point in time.  Hopefully, her back issues can be straightened out.  If she does have surgery, we have to keep in mind that she did have this DVT in the past.  I think this was probably 5 or 6 years ago.  She does have a prothrombin II gene mutation.  We will go ahead and plan to get her back to see [place] in another few months or so.  I do want to monitor the white cell count.  She has had some leukopenia in the past.  One would have to think that this is probably from medications that she is taking.    ______________________________ Josph Macho, M.D. PRE/MEDQ  D:  03/01/2013  T:  03/02/2013  Job:  [id number]

## 2013-03-03 ENCOUNTER — Telehealth: Payer: Self-pay | Admitting: *Deleted

## 2013-03-03 NOTE — Telephone Encounter (Signed)
Message copied by Anselm Jungling on Thu Mar 03, 2013 12:10 PM ------      Message from: Arlan Organ R      Created: Wed Mar 02, 2013  6:34 PM       Call - iron is still nice and low!!  No phlebotomy!!!  Cindee Lame ------

## 2013-03-03 NOTE — Telephone Encounter (Signed)
Called patient to let her know that her iron levels are still nice and low no need for phlebotomy.  Left message on patient personal cell phne

## 2013-05-30 ENCOUNTER — Ambulatory Visit (HOSPITAL_BASED_OUTPATIENT_CLINIC_OR_DEPARTMENT_OTHER): Payer: Medicare Other | Admitting: Hematology & Oncology

## 2013-05-30 ENCOUNTER — Other Ambulatory Visit (HOSPITAL_BASED_OUTPATIENT_CLINIC_OR_DEPARTMENT_OTHER): Payer: Medicare Other | Admitting: Lab

## 2013-05-30 ENCOUNTER — Encounter: Payer: Self-pay | Admitting: Hematology & Oncology

## 2013-05-30 DIAGNOSIS — I82401 Acute embolism and thrombosis of unspecified deep veins of right lower extremity: Secondary | ICD-10-CM

## 2013-05-30 DIAGNOSIS — D6852 Prothrombin gene mutation: Secondary | ICD-10-CM | POA: Insufficient documentation

## 2013-05-30 DIAGNOSIS — I82409 Acute embolism and thrombosis of unspecified deep veins of unspecified lower extremity: Secondary | ICD-10-CM

## 2013-05-30 DIAGNOSIS — D72819 Decreased white blood cell count, unspecified: Secondary | ICD-10-CM

## 2013-05-30 DIAGNOSIS — D6859 Other primary thrombophilia: Secondary | ICD-10-CM

## 2013-05-30 HISTORY — DX: Acute embolism and thrombosis of unspecified deep veins of right lower extremity: I82.401

## 2013-05-30 HISTORY — DX: Prothrombin gene mutation: D68.52

## 2013-05-30 LAB — CBC WITH DIFFERENTIAL (CANCER CENTER ONLY)
BASO#: 0 10*3/uL (ref 0.0–0.2)
BASO%: 0.4 % (ref 0.0–2.0)
EOS%: 6.3 % (ref 0.0–7.0)
Eosinophils Absolute: 0.3 10*3/uL (ref 0.0–0.5)
HCT: 36.9 % (ref 34.8–46.6)
HGB: 12 g/dL (ref 11.6–15.9)
LYMPH#: 1.5 10*3/uL (ref 0.9–3.3)
LYMPH%: 28.9 % (ref 14.0–48.0)
MCH: 27.5 pg (ref 26.0–34.0)
MCHC: 32.5 g/dL (ref 32.0–36.0)
MCV: 84 fL (ref 81–101)
MONO#: 0.7 10*3/uL (ref 0.1–0.9)
MONO%: 13.4 % — ABNORMAL HIGH (ref 0.0–13.0)
NEUT%: 51 % (ref 39.6–80.0)
NEUTROS ABS: 2.7 10*3/uL (ref 1.5–6.5)
PLATELETS: 173 10*3/uL (ref 145–400)
RBC: 4.37 10*6/uL (ref 3.70–5.32)
RDW: 14.5 % (ref 11.1–15.7)
WBC: 5.2 10*3/uL (ref 3.9–10.0)

## 2013-05-30 LAB — CHCC SATELLITE - SMEAR

## 2013-05-30 NOTE — Progress Notes (Signed)
This office note has been dictated.

## 2013-05-31 NOTE — Progress Notes (Signed)
CC:   [name], M.D.  DIAGNOSES: 1. Hemochromatosis (C282Y mutation-homozygous). 2. Prothrombin II gene mutation. 3. Past history of right lower extremity deep vein     thrombosis/pulmonary embolism.  CURRENT THERAPY: 1. Phlebotomy to maintain ferritin less than 100. 2. Aspirin 162 mg p.o. daily.  INTERIM HISTORY:  [name] comes in for followup.  We last saw her back in November.  She was put on 11-pound since we last saw her.  Her back has not been bothering her as much.  She has had no problems with cough or shortness of breath.  She has had some intermittent leg swelling.  Her brother passed away 3 weeks ago. She says she has been doing a lot of standing.  She has had no problems with bowels or bladder.  She has had no fever. There has been no cough.  There has been no bleeding.  When we last saw her, her ferritin was 22 and iron saturation of 19%.  PHYSICAL EXAMINATION:  On her physical exam, this is a well-developed, well-nourished white female in no obvious distress.  Vital signs show temperature of 98.1, pulse 55, respiratory rate 14, blood pressure 152/65, weight is 183 pounds.  Head and neck exam shows a normocephalic, atraumatic skull.  There are no ocular or oral lesions.  There are no palpable cervical or supraclavicular lymph nodes.  Lungs are clear bilaterally.  Cardiac exam, regular rate and rhythm with a normal S1, S2.  There are no murmurs, rubs, or bruits.  Abdomen is soft.  She has good bowel sounds.  There is no fluid wave.  There is no palpable abdominal mass.  There is no palpable hepatosplenomegaly.  Back exam; no tenderness over the spine, ribs, or hips.  Extremities show no clubbing, cyanosis, or edema.  No palpable venous cord is noted in the legs.  She may have some slight pitting edema in the lower legs.  There is some tenderness bilaterally in the pretibial regions.  Skin exam; no rashes, ecchymosis, or petechia.  Neurological:  No focal  neurological deficits.  LABORATORY STUDIES:  White cell count is 5.2, hemoglobin 12, hematocrit 37, platelet count 173.  MCV is 84.  IMPRESSION:  [name] is a very charming 73 year old white female. She has both hemochromatosis and prothrombin II gene mutation.  I feel moving better with respect to her white cell count being back to normal.  I did look at her blood smear.  I do not see anything unusual with the white cells.  There were no nucleated red cells.  There were no immature myeloid cells.  There were no atypical lymphocytes.  I think we probably get her back in 6 months now.  I do not see that we need to do any x-rays or lab work in between visits.  I need to make sure that she does get on aspirin.  I think aspirin would be very helpful for her.    ______________________________ Volanda Napoleon, M.D. PRE/MEDQ  D:  05/30/2013  T:  05/31/2013  Job:  [id number]

## 2013-11-07 ENCOUNTER — Encounter (HOSPITAL_COMMUNITY): Payer: Self-pay | Admitting: Emergency Medicine

## 2013-11-07 ENCOUNTER — Emergency Department (HOSPITAL_COMMUNITY)
Admission: EM | Admit: 2013-11-07 | Discharge: 2013-11-07 | Disposition: A | Discharge status: Home / Self Care | Payer: Medicare Other | Attending: Emergency Medicine | Admitting: Emergency Medicine

## 2013-11-07 DIAGNOSIS — Z885 Allergy status to narcotic agent status: Secondary | ICD-10-CM | POA: Insufficient documentation

## 2013-11-07 DIAGNOSIS — I82401 Acute embolism and thrombosis of unspecified deep veins of right lower extremity: Secondary | ICD-10-CM

## 2013-11-07 DIAGNOSIS — I10 Essential (primary) hypertension: Secondary | ICD-10-CM | POA: Insufficient documentation

## 2013-11-07 DIAGNOSIS — Z86718 Personal history of other venous thrombosis and embolism: Secondary | ICD-10-CM | POA: Insufficient documentation

## 2013-11-07 DIAGNOSIS — Z882 Allergy status to sulfonamides status: Secondary | ICD-10-CM | POA: Insufficient documentation

## 2013-11-07 DIAGNOSIS — M7989 Other specified soft tissue disorders: Secondary | ICD-10-CM | POA: Insufficient documentation

## 2013-11-07 DIAGNOSIS — D6852 Prothrombin gene mutation: Secondary | ICD-10-CM

## 2013-11-07 DIAGNOSIS — Z86711 Personal history of pulmonary embolism: Secondary | ICD-10-CM | POA: Insufficient documentation

## 2013-11-07 DIAGNOSIS — I824Y9 Acute embolism and thrombosis of unspecified deep veins of unspecified proximal lower extremity: Secondary | ICD-10-CM | POA: Insufficient documentation

## 2013-11-07 DIAGNOSIS — M79609 Pain in unspecified limb: Secondary | ICD-10-CM

## 2013-11-07 DIAGNOSIS — Z880 Allergy status to penicillin: Secondary | ICD-10-CM | POA: Insufficient documentation

## 2013-11-07 DIAGNOSIS — Z7901 Long term (current) use of anticoagulants: Secondary | ICD-10-CM | POA: Insufficient documentation

## 2013-11-07 DIAGNOSIS — D6859 Other primary thrombophilia: Secondary | ICD-10-CM | POA: Insufficient documentation

## 2013-11-07 DIAGNOSIS — Z79899 Other long term (current) drug therapy: Secondary | ICD-10-CM | POA: Insufficient documentation

## 2013-11-07 LAB — I-STAT CHEM 8, ED
BUN: 18 mg/dL (ref 6–23)
CALCIUM ION: 1.13 mmol/L (ref 1.13–1.30)
CHLORIDE: 101 meq/L (ref 96–112)
Creatinine, Ser: 1 mg/dL (ref 0.50–1.10)
Glucose, Bld: 87 mg/dL (ref 70–99)
HEMATOCRIT: 37 % (ref 36.0–46.0)
Hemoglobin: 12.6 g/dL (ref 12.0–15.0)
POTASSIUM: 3.5 meq/L — AB (ref 3.7–5.3)
SODIUM: 138 meq/L (ref 137–147)
TCO2: 26 mmol/L (ref 0–100)

## 2013-11-07 MED ORDER — RIVAROXABAN 15 MG PO TABS
15.0000 mg | ORAL_TABLET | Freq: Once | ORAL | Status: AC
  Administered 2013-11-07: 15 mg via ORAL
  Filled 2013-11-07: qty 1

## 2013-11-07 MED ORDER — XARELTO VTE STARTER PACK 15 & 20 MG PO TBPK: 15.0000 mg | ORAL_TABLET | ORAL | Status: DC

## 2013-11-07 NOTE — ED Provider Notes (Signed)
CSN: [id number]     Arrival date & time 11/07/13  1812 History   First MD Initiated Contact with Patient 11/07/13 2109     Chief Complaint  Patient presents with  . DVT     (Consider location/radiation/quality/duration/timing/severity/associated sxs/prior Treatment) HPI 73 year old female with a history of PE and DVT presents from her doctor's office for concern for a possible right lower extremity DVT.  According to the patient, she had a fall from a ladder approximately 5 weeks ago. Shortly afterwards she began developing some right lower extremity swelling which he felt was just secondary to her fall. She's been able to ambulate since that time, though continues to have some mild swelling and pain of the right lower extremity. She presented to her 33 office today, where d-dimer was checked and was found to be positive. She was sent over here for evaluation of DVT. She denies any chest pain, abdominal pain, back pain, nausea, vomiting, shortness of breath.   Past Medical History  Diagnosis Date  . Hypertension   . PE (pulmonary embolism)   . Hemochromatosis   . Prothrombin gene mutation 05/30/2013  . Right leg DVT 05/30/2013   Past Surgical History  Procedure Laterality Date  . Back surgery    . Shoulder surgery    . Foot surgery     History reviewed. No pertinent family history. History  Substance Use Topics  . Smoking status: Never Smoker   . Smokeless tobacco: Never Used     Comment: never used product  . Alcohol Use: No   OB History   Grav Para Term Preterm Abortions TAB SAB Ect Mult Living                 Review of Systems  Constitutional: Negative for fever and chills.  HENT: Negative for sore throat.   Eyes: Negative for pain.  Respiratory: Negative for cough and shortness of breath.   Cardiovascular: Positive for leg swelling. Negative for chest pain.  Gastrointestinal: Negative for nausea, vomiting, abdominal pain and diarrhea.  Genitourinary: Negative  for dysuria.  Musculoskeletal: Negative for back pain.  Skin: Negative for rash.  Neurological: Negative for numbness and headaches.      Allergies  Penicillins; Morphine and related; and Sulfa antibiotics  Home Medications   Prior to Admission medications   Medication Sig Start Date End Date Taking? Authorizing Provider  amLODipine (NORVASC) 5 MG tablet Take 5 mg by mouth every morning.  07/23/11  Yes Historical Provider, MD  Biotin 5000 MCG TABS Take 5,000 mcg by mouth every morning.    Yes Historical Provider, MD  Black Cohosh (REMIFEMIN) 20 MG TABS Take 20 mg by mouth 2 (two) times daily.    Yes Historical Provider, MD  calcium-vitamin D (OSCAL WITH D) 250-125 MG-UNIT per tablet Take 1 tablet by mouth 2 (two) times daily.   Yes Historical Provider, MD  Cholecalciferol (VITAMIN D) 2000 UNITS tablet Take 2,000 Units by mouth 2 (two) times daily.   Yes Historical Provider, MD  citalopram (CELEXA) 20 MG tablet Take 20 mg by mouth daily after supper.  08/05/11  Yes Historical Provider, MD  cyclobenzaprine (FLEXERIL) 10 MG tablet Take 10 mg by mouth 3 (three) times daily as needed for muscle spasms.  12/16/11  Yes Historical Provider, MD  fish oil-omega-3 fatty acids 1000 MG capsule Take 1 g by mouth daily.    Yes Historical Provider, MD  hydrochlorothiazide (HYDRODIURIL) 25 MG tablet Take 25 mg by mouth daily.  Yes Historical Provider, MD  HYDROcodone-acetaminophen (NORCO/VICODIN) 5-325 MG per tablet Take 1 tablet by mouth every 6 (six) hours as needed (pain).  04/08/13  Yes Historical Provider, MD  Multiple Vitamins-Minerals (CENTRUM SILVER PO) Take 1 tablet by mouth every morning.    Yes Historical Provider, MD  omeprazole (PRILOSEC) 20 MG capsule Take 20 mg by mouth every morning.  08/05/11  Yes Historical Provider, MD  polycarbophil (FIBERCON) 625 MG tablet Take 625 mg by mouth 2 (two) times daily.   Yes Historical Provider, MD  pramipexole (MIRAPEX) 0.5 MG tablet Take 0.5 mg by mouth every  evening.   Yes Historical Provider, MD  propranolol (INDERAL) 10 MG tablet Take 10 mg by mouth 2 (two) times daily.  07/23/11  Yes Historical Provider, MD  temazepam (RESTORIL) 30 MG capsule Take 30 mg by mouth at bedtime.  07/24/11  Yes Historical Provider, MD  trimethoprim-polymyxin b (POLYTRIM) ophthalmic solution Place 1 drop into both eyes every 8 (eight) hours.  10/08/13  Yes Historical Provider, MD  Turmeric Curcumin 500 MG CAPS Take 500 mg by mouth at bedtime.    Yes Historical Provider, MD  vitamin E 400 UNIT capsule Take 400 Units by mouth daily.   Yes Historical Provider, MD  XARELTO STARTER PACK 15 & 20 MG TBPK Take 15-20 mg by mouth as directed. Take as directed on package: Start with one 15mg  tablet by mouth twice a day with food. On Day 22, switch to one 20mg  tablet once a day with food. 11/07/13   Freddi Che, MD   BP 173/74  Pulse 75  Temp(Src) 98.2 F (36.8 C) (Oral)  Resp 16  Wt 175 lb 3 oz (79.465 kg)  SpO2 98% Physical Exam  Constitutional: She is oriented to person, place, and time. She appears well-developed and well-nourished. No distress.  HENT:  Head: Normocephalic and atraumatic.  Eyes: Pupils are equal, round, and reactive to light. Right eye exhibits no discharge. Left eye exhibits no discharge.  Neck: Normal range of motion.  Cardiovascular: Normal rate, regular rhythm and normal heart sounds.   Pulmonary/Chest: Effort normal and breath sounds normal.  Abdominal: Soft. She exhibits no distension. There is no tenderness.  Musculoskeletal: Normal range of motion.       Right upper leg: She exhibits swelling. She exhibits no edema.       Right lower leg: She exhibits swelling. She exhibits no edema.  Neurological: She is alert and oriented to person, place, and time.  Skin: Skin is warm. She is not diaphoretic.    ED Course  Procedures (including critical care time) Labs Review Labs Reviewed  I-STAT CHEM 8, ED - Abnormal; Notable for the following:     Potassium 3.5 (*)    All other components within normal limits    Imaging Review No results found.   EKG Interpretation None      MDM   Final diagnoses:  DVT (deep venous thrombosis), right   73 year old female with a history of prior DVTs and PEs presents today with right lower extremity swelling concerning for any DVT. D-dimer was performed at her physician's office and was elevated. Patient was able to obtain a DVT study prior to my evaluation in the vascular lab. Results are as follows:  "Findings consistent with indeterminate age deep vein thrombosis involving the right femoral and popliteal veins."  The patient has no history of any acute bleeding. She has no history of any ischemic or hemorrhagic strokes. The patient's creatinine is as  above. Her GFR is above 30. Degrees findings, the patient is a good candidate for Xarelto treatment for her anticoagulation. We'll give 15 mg here and discharge her with a Xarelto starter pack, with which she will take 15 mg Xarelto twice a day for the first 21 days and then begin taking 20 mg once a day. Patient voices understanding. Strict return precautions were given. Patient discharged to home in stable condition. Patient seen and evaluated by myself and by the attending Dr. Reather Converse.    Freddi Che, MD 11/08/13 [phone]

## 2013-11-07 NOTE — Progress Notes (Signed)
Bilateral lower extremity venous duplex completed.  Right:  DVT noted in the femoral and popliteal veins.  No evidence of superficial thrombosis.  No Baker's cyst.  Left:  No evidence of DVT, superficial thrombosis, or Baker's cyst.

## 2013-11-07 NOTE — ED Notes (Signed)
Present with right leg swelling and pain began in [place] after injuring leg. She went to her Doctor today and was sent here due to elevated D dimer. HX of PE and DVT. CMS intact on right leg-edema noted. Pt denies chest pain and SOB. C/o right leg soreness.

## 2013-11-07 NOTE — Discharge Instructions (Signed)
Anticoagulation, Generic Anticoagulants are medicines used to prevent clots from developing in your veins. These medicine are also known as blood thinners. If blood clots are untreated, they could travel to your lungs. This is called a pulmonary embolus. A blood clot in your lungs can be fatal.  Health care providers often use anticoagulants to prevent clots following surgery. Anticoagulants are also used along with aspirin when the heart is not getting enough blood. Another anticoagulant called warfarin is started 2 to 3 days after a rapid-acting injectable anticoagulant is started. The rapid-acting anticoagulants are usually continued until warfarin has begun to work. Your health care provider will judge this length of time by blood tests known as the prothrombin time (PT) and International Normalization Ratio (INR). This means that your blood is at the necessary and best level to prevent clots. RISKS AND COMPLICATIONS  If you have received recent epidural anesthesia, spinal anesthesia, or a spinal tap while receiving anticoagulants, you are at risk for developing a blood clot in or around the spine. This condition could result in long-term or permanent paralysis.  Because anticoagulants thin your blood, severe bleeding may occur from any tissue or organ. Symptoms of the blood being too thin may include:  Bleeding from the nose or gums that does not stop quickly.  Blood in bowel movements which may appear as bright red, dark, or black tarry stools.  Blood in the urine which may appear as pink, red, or brown urine.  Unusual bruising or bruising easily.  A cut that does not stop bleeding within 10 minutes.  Vomiting blood or continuous nausea for more than 1 day.  Coughing up blood.  Broken blood vessels in your eye (subconjunctival hemorrhage).  Abdominal or back pain with or without flank bruising.  Sudden, severe headache.  Sudden weakness or numbness of the face, arm, or leg,  especially on one side of the body.  Sudden confusion.  Trouble speaking (aphasia) or understanding.  Sudden trouble seeing in one or both eyes.  Sudden trouble walking.  Dizziness.  Loss of balance or coordination.  Vaginal bleeding.  Swelling or pain at an injection site.  Superficial fat tissue death (necrosis) which may cause skin scarring. This is more common in women and may first present as pain in the waist, thighs, or buttocks.  Fever.  Too little anticoagulation continues to allow the risk for blood clots. HOME CARE INSTRUCTIONS   Due to the complications of anticoagulants, it is very important that you take your anticoagulant as directed by your health care provider. Anticoagulants need to be taken exactly as instructed. Be sure you understand all your anticoagulant instructions.  Keep all follow-up appointments with your health care provider as directed. It is very important to keep your appointments. Not keeping appointments could result in a chronic or permanent injury, pain, or disability.  Warfarin. Your health care provider will advise you on the length of treatment (usually 3-6 months, sometimes lifelong).  Take warfarin exactly as directed by your health care provider. It is recommended that you take your warfarin dose at the same time of the day. It is preferred that you take warfarin in the late afternoon. If you have been told to stop taking warfarin, do not resume taking warfarin until directed to do so by your health care provider. Follow your health care provider's instructions if you accidentally take an extra dose or miss a dose of warfarin. It is very important to take warfarin as directed since bleeding or blood  clots could result in chronic or permanent injury, pain, or disability.  Too much and too little warfarin are both dangerous. Too much warfarin increases the risk of bleeding. Too little warfarin continues to allow the risk for blood clots. While  taking warfarin, you will need to have regular blood tests to measure your blood clotting time. These blood tests usually include both the prothrombin time (PT) and International Normalized Ratio (INR) tests. The PT and INR results allow your health care provider to adjust your dose of warfarin. The dose can change for many reasons. It is critically important that you have your PT and INR levels drawn exactly as directed. Your warfarin dose may stay the same or change depending on what the PT and INR results are. Be sure to follow up with your health care provider regarding your PT and INR test results and what your warfarin dosage should be.  Many medicines can interfere with warfarin and affect the PT and INR results. You must tell your health care provider about any and all medicines you take, this includes all vitamins and supplements. Ask your health care provider before taking these. Prescription and over-the-counter medicine consistency is critical to warfarin management. It is important that potential interactions are checked before you start a new medicine. Be especially cautious with aspirin and anti-inflammatory medicines. Ask your health care provider before taking these. Medicines such as antibiotics and acid-reducing medicine can interact with warfarin and can cause an increased warfarin effect. Warfarin can also interfere with the effectiveness of medicines you are taking. Do not take or discontinue any prescribed or over-the-counter medicine except on the advice of your health care provider or pharmacist.  Some vitamins, supplements, and herbal products interfere with the effectiveness of warfarin. Vitamin E may increase the anticoagulant effects of warfarin. Vitamin K may can cause warfarin to be less effective. Do not take or discontinue any vitamin, supplement, or herbal product except on the advice of your health care provider or pharmacist.  Eat what you normally eat and keep the vitamin K  content of your diet consistent. Avoid major changes in your diet, or notify your health care provider before changing your diet. Suddenly getting a lot more vitamin K could cause your blood to clot too quickly. A sudden decrease in vitamin K intake could cause your blood to clot too slowly. These changes in vitamin K intake could lead to dangerous blood clotsor to bleeding. To keep your vitamin K intake consistent, you must be aware of which foods contain moderate or high amounts of vitamin K. Some foods high in vitamin K include spinach, kale, broccoli, cabbage, greens, Brussels sprouts, asparagus, [place], coleslaw, parsley, and green tea. Arrange a visit with a dietitian to answer your questions.  If you have a loss of appetite or get the stomach flu (viral gastroenteritis), talk to your health care provider as soon as possible. A decrease in your normal vitamin K intake can make you more sensitive to your usual dose of warfarin.  Some medical conditions may increase your risk for bleeding while you are taking warfarin. A fever, diarrhea lasting more than a day, worsening heart failure, or worsening liver function are some medical conditions that could affect warfarin. Contact your health care provider if you have any of these medical conditions.  Alcohol can change the body's ability to handle warfarin. It is best to avoid alcoholic drinks or consume only very small amounts while taking warfarin. Notify your health care provider if  you change your alcohol intake. A sudden increase in alcohol use can increase your risk of bleeding. Chronic alcohol use can cause warfarin to be less effective.  Be careful not to cut yourself when using sharp objects or while shaving.  Inform all your health care providers and your dentist that you take an anticoagulant.  Limit physical activities or sports that could result in a fall or cause injury. Avoid contact sports.  Wear medical alert jewelry or carry a  medical alert card. SEEK IMMEDIATE MEDICAL CARE IF:  You cough up blood.  You have dark or black stools or there is bright red blood coming from your rectum.  You vomit blood or have nausea for more than 1 day.  You have blood in the urine or pink colored urine.  You have unusual bruising or have increased bruising.  You have bleeding from the nose or gums that does not stop quickly.  You have a cut that does not stop bleeding within a 2-3 minutes.  You have sudden weakness or numbness of the face, arm, or leg, especially on one side of the body.  You have sudden confusion.  You have trouble speaking (aphasia) or understanding.  You have sudden trouble seeing in one or both eyes.  You have sudden trouble walking.  You have dizziness.  You have a loss of balance or coordination.  You have a sudden, severe headache.  You have a serious fall or head injury, even if you are not bleeding.  You have swelling or pain at an injection site.  You have unexplained tenderness or pain in the abdomen, back, waist, thighs or buttocks.  You have a fever. Any of these symptoms may represent a serious problem that is an emergency. Do not wait to see if the symptoms will go away. Get medical help right away. Call your local emergency services (911 in [place] Do not drive yourself to the hospital. Document Released: 04/14/2005 Document Revised: 04/19/2013 Document Reviewed: 11/17/2007 [facility] Patient Information 2015 [place], [place]. This information is not intended to replace advice given to you by your health care provider. Make sure you discuss any questions you have with your health care provider.  Deep Vein Thrombosis A deep vein thrombosis (DVT) is a blood clot that develops in the deep, larger veins of the leg, arm, or pelvis. These are more dangerous than clots that might form in veins near the surface of the body. A DVT can lead to complications if the clot breaks off and travels in  the bloodstream to the lungs.  A DVT can damage the valves in your leg veins, so that instead of flowing upward, the blood pools in the lower leg. This is called post-thrombotic syndrome, and it can result in pain, swelling, discoloration, and sores on the leg. CAUSES Usually, several things contribute to blood clots forming. Contributing factors include:  The flow of blood slows down.  The inside of the vein is damaged in some way.  You have a condition that makes blood clot more easily. RISK FACTORS Some people are more likely than others to develop blood clots. Risk factors include:   Older age, especially over 73 years of age.  Having a family history of blood clots or if you have already had a blot clot.  Having major or lengthy surgery. This is especially true for surgery on the hip, knee, or belly (abdomen). Hip surgery is particularly high risk.  Breaking a hip or leg.  Sitting or lying  still for a long time. This includes long-distance travel, paralysis, or recovery from an illness or surgery.  Having cancer or cancer treatment.  Having a long, thin tube (catheter) placed inside a vein during a medical procedure.  Being overweight (obese).  Pregnancy and childbirth.  Hormone changes make the blood clot more easily during pregnancy.  The fetus puts pressure on the veins of the pelvis.  There is a risk of injury to veins during delivery or a caesarean. The risk is highest just after childbirth.  Medicines with the female hormone estrogen. This includes birth control pills and hormone replacement therapy.  Smoking.  Other circulation or heart problems.  SIGNS AND SYMPTOMS When a clot forms, it can either partially or totally block the blood flow in that vein. Symptoms of a DVT can include:  Swelling of the leg or arm, especially if one side is much worse.  Warmth and redness of the leg or arm, especially if one side is much worse.  Pain in an arm or leg. If the  clot is in the leg, symptoms may be more noticeable or worse when standing or walking. The symptoms of a DVT that has traveled to the lungs (pulmonary embolism, PE) usually start suddenly and include:  Shortness of breath.  Coughing.  Coughing up blood or blood-tinged phlegm.  Chest pain. The chest pain is often worse with deep breaths.  Rapid heartbeat. Anyone with these symptoms should get emergency medical treatment right away. Call your local emergency services (911 in the [place] if you have these symptoms. DIAGNOSIS If a DVT is suspected, your health care provider will take a full medical history and perform a physical exam. Tests that also may be required include:  Blood tests, including studies of the clotting properties of the blood.  Ultrasonography to see if you have clots in your legs or lungs.  X-rays to show the flow of blood when dye is injected into the veins (venography).  Studies of your lungs if you have any chest symptoms. PREVENTION  Exercise the legs regularly. Take a brisk 30-minute walk every day.  Maintain a weight that is appropriate for your height.  Avoid sitting or lying in bed for long periods of time without moving your legs.  Women, particularly those over the age of 27 years, should consider the risks and benefits of taking estrogen medicines, including birth control pills.  Do not smoke, especially if you take estrogen medicines.  Long-distance travel can increase your risk of DVT. You should exercise your legs by walking or pumping the muscles every hour.  In-hospital prevention:  Many of the risk factors above relate to situations that exist with hospitalization, either for illness, injury, or elective surgery.  Your health care provider will assess you for the need for venous thromboembolism prophylaxis when you are admitted to the hospital. If you are having surgery, your surgeon will assess you the day of or day after  surgery.  Prevention may include medical and nonmedical measures. TREATMENT Once identified, a DVT can be treated. It can also be prevented in some circumstances. Once you have had a DVT, you may be at increased risk for a DVT in the future. The most common treatment for DVT is blood thinning (anticoagulant) medicine, which reduces the blood's tendency to clot. Anticoagulants can stop new blood clots from forming and stop old ones from growing. They cannot dissolve existing clots. Your body does this by itself over time. Anticoagulants can be given by mouth,  by IV access, or by injection. Your health care provider will determine the best program for you. Other medicines or treatments that may be used are:  Heparin or related medicines (low molecular weight heparin) are usually the first treatment for a blood clot. They act quickly. However, they cannot be taken orally.  Heparin can cause a fall in a component of blood that stops bleeding and forms blood clots (platelets). You will be monitored with blood tests to be sure this does not occur.  Warfarin is an anticoagulant that can be swallowed. It takes a few days to start working, so usually heparin or related medicines are used in combination. Once warfarin is working, heparin is usually stopped.  Less commonly, clot dissolving drugs (thrombolytics) are used to dissolve a DVT. They carry a high risk of bleeding, so they are used mainly in severe cases, where your life or a limb is threatened.  Very rarely, a blood clot in the leg needs to be removed surgically.  If you are unable to take anticoagulants, your health care provider may arrange for you to have a filter placed in a main vein in your abdomen. This filter prevents clots from traveling to your lungs. HOME CARE INSTRUCTIONS  Take all medicines prescribed by your health care provider. Only take over-the-counter or prescription medicines for pain, fever, or discomfort as directed by your  health care provider.  Warfarin. Most people will continue taking warfarin after hospital discharge. Your health care provider will advise you on the length of treatment (usually 3-6 months, sometimes lifelong).  Too much and too little warfarin are both dangerous. Too much warfarin increases the risk of bleeding. Too little warfarin continues to allow the risk for blood clots. While taking warfarin, you will need to have regular blood tests to measure your blood clotting time. These blood tests usually include both the prothrombin time (PT) and international normalized ratio (INR) tests. The PT and INR results allow your health care provider to adjust your dose of warfarin. The dose can change for many reasons. It is critically important that you take warfarin exactly as prescribed, and that you have your PT and INR levels drawn exactly as directed.  Many foods, especially foods high in vitamin K, can interfere with warfarin and affect the PT and INR results. Foods high in vitamin K include spinach, kale, broccoli, cabbage, collard and turnip greens, brussel sprouts, peas, cauliflower, seaweed, and parsley as well as beef and pork liver, green tea, and soybean oil. You should eat a consistent amount of foods high in vitamin K. Avoid major changes in your diet, or notify your health care provider before changing your diet. Arrange a visit with a dietitian to answer your questions.  Many medicines can interfere with warfarin and affect the PT and INR results. You must tell your health care provider about any and all medicines you take. This includes all vitamins and supplements. Be especially cautious with aspirin and anti-inflammatory medicines. Ask your health care provider before taking these. Do not take or discontinue any prescribed or over-the-counter medicine except on the advice of your health care provider or pharmacist.  Warfarin can have side effects, primarily excessive bruising or bleeding. You  will need to hold pressure over cuts for longer than usual. Your health care provider or pharmacist will discuss other potential side effects.  Alcohol can change the body's ability to handle warfarin. It is best to avoid alcoholic drinks or consume only very small amounts while taking  warfarin. Notify your health care provider if you change your alcohol intake.  Notify your dentist or other health care providers before procedures.  Activity. Ask your health care provider how soon you can go back to normal activities. It is important to stay active to prevent blood clots. If you are on anticoagulant medicine, avoid contact sports.  Exercise. It is very important to exercise. This is especially important while traveling, sitting, or standing for long periods of time. Exercise your legs by walking or by pumping the muscles frequently. Take frequent walks.  Compression stockings. These are tight elastic stockings that apply pressure to the lower legs. This pressure can help keep the blood in the legs from clotting. You may need to wear compression stockings at home to help prevent a DVT.  Do not smoke. If you smoke, quit. Ask your health care provider for help with quitting smoking.  Learn as much as you can about DVT. Knowing more about the condition should help you keep it from coming back.  Wear a medical alert bracelet or carry a medical alert card. SEEK MEDICAL CARE IF:  You notice a rapid heartbeat.  You feel weaker or more tired than usual.  You feel faint.  You notice increased bruising.  You feel your symptoms are not getting better in the time expected.  You believe you are having side effects of medicine. SEEK IMMEDIATE MEDICAL CARE IF:  You have chest pain.  You have trouble breathing.  You have new or increased swelling or pain in one leg.  You cough up blood.  You notice blood in vomit, in a bowel movement, or in urine. MAKE SURE YOU:  Understand these  instructions.  Will watch your condition.  Will get help right away if you are not doing well or get worse. Document Released: 04/14/2005 Document Revised: 02/02/2013 Document Reviewed: 12/20/2012 [facility] Patient Information 2015 [place], [place]. This information is not intended to replace advice given to you by your health care provider. Make sure you discuss any questions you have with your health care provider.

## 2013-11-07 NOTE — ED Notes (Signed)
Dr. Walton at bedside 

## 2013-11-07 NOTE — Progress Notes (Signed)
Completed xarelto education with patient and her husband. Patient was given the xarelto discharge kit. All questions answered.  Nena Jordan, PharmD, BCPS 11/07/13, 10:28 PM

## 2013-11-09 NOTE — ED Provider Notes (Signed)
Medical screening examination/treatment/procedure(s) were conducted as a shared visit with non-physician practitioner(s) or resident and myself. I personally evaluated the patient during the encounter and agree with the findings and plan unless otherwise indicated.  I have personally reviewed any xrays and/ or EKG's with the provider and I agree with interpretation.  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 Patient elevated dimer and an ultrasound confirmed DVT. Patient currently is not on anticoagulants. Patient denies chest pain shortness of breath. A on exam patient well-appearing, no respiratory difficulty, mild swelling tenderness right lower extremity with normal skin color. Plan to start oral anticoagulation, followup and reasons to return discussed.  DVT right lower extremity   Mariea Clonts, MD 11/09/13 [phone]

## 2013-11-28 ENCOUNTER — Ambulatory Visit (HOSPITAL_BASED_OUTPATIENT_CLINIC_OR_DEPARTMENT_OTHER): Payer: Medicare Other | Admitting: Family

## 2013-11-28 ENCOUNTER — Encounter: Payer: Self-pay | Admitting: Family

## 2013-11-28 ENCOUNTER — Other Ambulatory Visit (HOSPITAL_BASED_OUTPATIENT_CLINIC_OR_DEPARTMENT_OTHER): Payer: Medicare Other | Admitting: Lab

## 2013-11-28 VITALS — BP 148/80 | HR 50 | Temp 98.1°F | Resp 20 | Wt 178.0 lb

## 2013-11-28 DIAGNOSIS — I82409 Acute embolism and thrombosis of unspecified deep veins of unspecified lower extremity: Secondary | ICD-10-CM

## 2013-11-28 DIAGNOSIS — Z7901 Long term (current) use of anticoagulants: Secondary | ICD-10-CM

## 2013-11-28 DIAGNOSIS — I82401 Acute embolism and thrombosis of unspecified deep veins of right lower extremity: Secondary | ICD-10-CM

## 2013-11-28 LAB — CBC WITH DIFFERENTIAL (CANCER CENTER ONLY)
BASO#: 0 10*3/uL (ref 0.0–0.2)
BASO%: 0.9 % (ref 0.0–2.0)
EOS%: 4.9 % (ref 0.0–7.0)
Eosinophils Absolute: 0.2 10*3/uL (ref 0.0–0.5)
HEMATOCRIT: 37.1 % (ref 34.8–46.6)
HEMOGLOBIN: 12.1 g/dL (ref 11.6–15.9)
LYMPH#: 1.6 10*3/uL (ref 0.9–3.3)
LYMPH%: 36.4 % (ref 14.0–48.0)
MCH: 28.5 pg (ref 26.0–34.0)
MCHC: 32.6 g/dL (ref 32.0–36.0)
MCV: 87 fL (ref 81–101)
MONO#: 0.5 10*3/uL (ref 0.1–0.9)
MONO%: 10.4 % (ref 0.0–13.0)
NEUT#: 2 10*3/uL (ref 1.5–6.5)
NEUT%: 47.4 % (ref 39.6–80.0)
Platelets: 204 10*3/uL (ref 145–400)
RBC: 4.25 10*6/uL (ref 3.70–5.32)
RDW: 14.6 % (ref 11.1–15.7)
WBC: 4.3 10*3/uL (ref 3.9–10.0)

## 2013-11-28 LAB — IRON AND TIBC CHCC
%SAT: 17 % — AB (ref 21–57)
IRON: 44 ug/dL (ref 41–142)
TIBC: 260 ug/dL (ref 236–444)
UIBC: 216 ug/dL (ref 120–384)

## 2013-11-28 LAB — CHCC SATELLITE - SMEAR

## 2013-11-28 LAB — FERRITIN CHCC: FERRITIN: 40 ng/mL (ref 9–269)

## 2013-11-28 MED ORDER — RIVAROXABAN 20 MG PO TABS: 20.0000 mg | ORAL_TABLET | Freq: Every day | ORAL | Status: DC

## 2013-11-28 NOTE — Progress Notes (Addendum)
[place]  Telephone:([phone] [phone]  ID: [name] OB: 13-Apr-1941 MR#: [id number]#:[id number] Patient Care Team: Shirline Frees, MD as PCP - General (Family Medicine)  DIAGNOSIS: 1. Hemochromatosis (C282Y mutation-homozygous).  2. Prothrombin II gene mutation.  3. Past history of right lower extremity deep vein  thrombosis/pulmonary embolism.  INTERVAL HISTORY: [name] comes in today for follow-up. We last saw her in February. She has not had to have phlebotomy in several years. She has fallen twice in the last 2 months and when she saw here PCP several weeks ago he sent her to the Ed. She was found to have a DVT in her right femoral and popliteal vein. She has been on Xarelto starter pack for 3 weeks. She has one week left. She She denies fever, chills, rash, cough, headaches, dizziness, SOB, chest pain, palpitations, abdominal pain, constipation, diarrhea, blood in urine or stool. He right leg is mildly swollen but it is not painful to touch. She denies having any swelling, tenderness, numbness or tingling in her other extremities. Her iron saturation today is 17 Ferritin 40. Her appetite is good. She just got back from the beach and has been very active.   CURRENT TREATMENT: 1. Phlebotomy to maintain ferritin less than 100.  2. Aspirin 81 mg p.o. daily.  REVIEW OF SYSTEMS: All other 10 point review of systems is negative except for those issues mentioned above.   PAST MEDICAL HISTORY: Past Medical History  Diagnosis Date  . Hypertension   . PE (pulmonary embolism)   . Hemochromatosis   . Prothrombin gene mutation 05/30/2013  . Right leg DVT 05/30/2013   PAST SURGICAL HISTORY: Past Surgical History  Procedure Laterality Date  . Back surgery    . Shoulder surgery    . Foot surgery      FAMILY HISTORY History reviewed. No pertinent family history.  GYNECOLOGIC HISTORY:  No LMP recorded. Patient is postmenopausal.   SOCIAL HISTORY:   History   Social History  . Marital Status: Married    Spouse Name: N/A    Number of Children: N/A  . Years of Education: N/A   Occupational History  . Not on file.   Social History Main Topics  . Smoking status: Never Smoker   . Smokeless tobacco: Never Used     Comment: never used product  . Alcohol Use: No  . Drug Use: No  . Sexual Activity: Not on file   Other Topics Concern  . Not on file   Social History Narrative  . No narrative on file   ADVANCED DIRECTIVES: <no information>  HEALTH MAINTENANCE: History  Substance Use Topics  . Smoking status: Never Smoker   . Smokeless tobacco: Never Used     Comment: never used product  . Alcohol Use: No   Colonoscopy: PAP: Bone density: Lipid panel:  Allergies  Allergen Reactions  . Penicillins Anaphylaxis  . Morphine And Related Other (See Comments)    twitching  . Sulfa Antibiotics Other (See Comments)    unknown   Current Outpatient Prescriptions  Medication Sig Dispense Refill  . amLODipine (NORVASC) 5 MG tablet Take 5 mg by mouth every morning.       . Biotin 5000 MCG TABS Take 5,000 mcg by mouth every morning.       . Black Cohosh (REMIFEMIN) 20 MG TABS Take 20 mg by mouth 2 (two) times daily.       . calcium-vitamin D (OSCAL WITH D) 250-125 MG-UNIT  per tablet Take 1 tablet by mouth 2 (two) times daily.      . Cholecalciferol (VITAMIN D) 2000 UNITS tablet Take 2,000 Units by mouth 2 (two) times daily.      . citalopram (CELEXA) 20 MG tablet Take 20 mg by mouth daily after supper.       . cyclobenzaprine (FLEXERIL) 10 MG tablet Take 10 mg by mouth 3 (three) times daily as needed for muscle spasms.       . fish oil-omega-3 fatty acids 1000 MG capsule Take 1 g by mouth daily.       . hydrochlorothiazide (HYDRODIURIL) 25 MG tablet Take 25 mg by mouth daily.      Marland Kitchen HYDROcodone-acetaminophen (NORCO/VICODIN) 5-325 MG per tablet Take 1 tablet by mouth every 6 (six) hours as needed (pain).       . Multiple  Vitamins-Minerals (CENTRUM SILVER PO) Take 1 tablet by mouth every morning.       Marland Kitchen omeprazole (PRILOSEC) 20 MG capsule Take 20 mg by mouth every morning.       . polycarbophil (FIBERCON) 625 MG tablet Take 625 mg by mouth 2 (two) times daily.      . pramipexole (MIRAPEX) 0.5 MG tablet Take 0.5 mg by mouth every evening.      . propranolol (INDERAL) 10 MG tablet Take 10 mg by mouth 2 (two) times daily.       . temazepam (RESTORIL) 30 MG capsule Take 30 mg by mouth at bedtime.       Marland Kitchen trimethoprim-polymyxin b (POLYTRIM) ophthalmic solution Place 1 drop into both eyes every 8 (eight) hours.       . Turmeric Curcumin 500 MG CAPS Take 500 mg by mouth at bedtime.       . vitamin E 400 UNIT capsule Take 400 Units by mouth daily.      . rivaroxaban (XARELTO) 20 MG TABS tablet Take 1 tablet (20 mg total) by mouth daily with supper.  30 tablet  12   No current facility-administered medications for this visit.   OBJECTIVE: Filed Vitals:   11/28/13 1251  BP: 148/80  Pulse: 50  Temp: 98.1 F (36.7 C)  Resp: 20   Body mass index is 31.54 kg/(m^2). ECOG FS:0 - Asymptomatic Ocular: Sclerae unicteric, pupils equal, round and reactive to light Ear-nose-throat: Oropharynx clear, dentition fair Lymphatic: No cervical or supraclavicular adenopathy Lungs no rales or rhonchi, good excursion bilaterally Heart regular rate and rhythm, no murmur appreciated Abd soft, nontender, positive bowel sounds MSK no focal spinal tenderness, no joint edema Neuro: non-focal, well-oriented, appropriate affect Breasts: Deferred  LAB RESULTS: CMP     Component Value Date/Time   NA 138 11/07/2013 2143   K 3.5* 11/07/2013 2143   CL 101 11/07/2013 2143   CO2 31 08/11/2011 1524   GLUCOSE 87 11/07/2013 2143   BUN 18 11/07/2013 2143   CREATININE 1.00 11/07/2013 2143   CALCIUM 9.8 08/11/2011 1524   PROT 6.9 08/11/2011 1524   ALBUMIN 4.0 08/11/2011 1524   AST 28 08/11/2011 1524   ALT 15 08/11/2011 1524   ALKPHOS 82 08/11/2011  1524   BILITOT 0.6 08/11/2011 1524   GFRNONAA 48* 11/27/2010 0455   GFRAA 58* 11/27/2010 0455   No results found for this basename: SPEP, UPEP,  kappa and lambda light chains   Lab Results  Component Value Date   WBC 4.3 11/28/2013   NEUTROABS 2.0 11/28/2013   HGB 12.1 11/28/2013   HCT 37.1 11/28/2013  MCV 87 11/28/2013   PLT 204 11/28/2013   No results found for this basename: LABCA2   No components found with this basename: [id number]   No results found for this basename: INR,  in the last 168 hours  STUDIES: No results found.  ASSESSMENT/PLAN: [name] is a very charming 73 year old white female. She has both hemochromatosis and prothrombin II gene mutation. For the DVT in her right leg we will keep her on Xarelto 20mg  daily. We will probably continue this for at least a year.  She will stop the Asprin.  We will get a repeat doppler of her leg in 2 months to see if it is resolving.  We will see her back in 1 month for follow-up and labs.  All questions were answered and she is in agreement with the plan. She knows to call here with any questions or concerns. We can certainly see her sooner if need be.   Eliezer Bottom, NP 11/28/2013 2:57 PM  ADDENDUM:  I saw and examined [name] with Judson Roch. I have known her for many years. She has a history of hemochromatosis which really has not been a problem. There she does have a past history of thromboembolic disease. She had a blood clot in the left leg. I that this was probably 6 or 7 years ago. She does have a prothrombin II mutation.  This recent problems the right leg appeared to have after she fell off a ladder. She now on to the beach. I suspect she developed that during this time. This was about 3 weeks ago.  She was on aspirin 81 mg a day.  She now is on Xarelto. I talked to her about Xarelto and that it was safe. I told her that it could be reversed if she had bleeding. I told her that it would not interfere with her medications.  I  believe at one year of anticoagulation would be appropriate for her. Her last thrombus was about 6 or 7 years ago. This thrombus seemed to be initiated with her fall.  I would repeat a Doppler in about 2 months.  We spent about 40 minutes or so with [name].  Lum Keas

## 2013-11-29 ENCOUNTER — Telehealth: Payer: Self-pay | Admitting: *Deleted

## 2013-11-29 LAB — D-DIMER, QUANTITATIVE: D-Dimer, Quant: 3.74 ug/mL-FEU — ABNORMAL HIGH (ref 0.00–0.48)

## 2013-11-29 NOTE — Telephone Encounter (Addendum)
Message copied by Lenn Sink on Tue Nov 29, 2013  8:57 AM ------      Message from: Burney Gauze R      Created: Mon Nov 28, 2013 11:04 PM       Call - iron is still ok.  No need for phlebotomy.  pete ------Left voicemail informing pt iron is still ok. No need for phlebotomy

## 2014-01-03 ENCOUNTER — Other Ambulatory Visit (HOSPITAL_BASED_OUTPATIENT_CLINIC_OR_DEPARTMENT_OTHER): Payer: Medicare Other | Admitting: Lab

## 2014-01-03 ENCOUNTER — Encounter: Payer: Self-pay | Admitting: Hematology & Oncology

## 2014-01-03 ENCOUNTER — Ambulatory Visit (HOSPITAL_BASED_OUTPATIENT_CLINIC_OR_DEPARTMENT_OTHER): Payer: Medicare Other | Admitting: Hematology & Oncology

## 2014-01-03 VITALS — BP 147/76 | HR 55 | Temp 97.7°F | Resp 14 | Ht 63.0 in | Wt 171.0 lb

## 2014-01-03 DIAGNOSIS — I82401 Acute embolism and thrombosis of unspecified deep veins of right lower extremity: Secondary | ICD-10-CM

## 2014-01-03 DIAGNOSIS — I82409 Acute embolism and thrombosis of unspecified deep veins of unspecified lower extremity: Secondary | ICD-10-CM

## 2014-01-03 LAB — CMP (CANCER CENTER ONLY)
ALBUMIN: 3.3 g/dL (ref 3.3–5.5)
ALT(SGPT): 17 U/L (ref 10–47)
AST: 23 U/L (ref 11–38)
Alkaline Phosphatase: 94 U/L — ABNORMAL HIGH (ref 26–84)
BUN, Bld: 15 mg/dL (ref 7–22)
CO2: 29 meq/L (ref 18–33)
Calcium: 8.9 mg/dL (ref 8.0–10.3)
Chloride: 99 mEq/L (ref 98–108)
Creat: 0.8 mg/dl (ref 0.6–1.2)
GLUCOSE: 94 mg/dL (ref 73–118)
POTASSIUM: 3.1 meq/L — AB (ref 3.3–4.7)
SODIUM: 139 meq/L (ref 128–145)
TOTAL PROTEIN: 7.1 g/dL (ref 6.4–8.1)
Total Bilirubin: 0.9 mg/dl (ref 0.20–1.60)

## 2014-01-03 LAB — CBC WITH DIFFERENTIAL (CANCER CENTER ONLY)
BASO#: 0 10*3/uL (ref 0.0–0.2)
BASO%: 0.5 % (ref 0.0–2.0)
EOS%: 5.2 % (ref 0.0–7.0)
Eosinophils Absolute: 0.2 10*3/uL (ref 0.0–0.5)
HEMATOCRIT: 37.4 % (ref 34.8–46.6)
HGB: 12.4 g/dL (ref 11.6–15.9)
LYMPH#: 1.6 10*3/uL (ref 0.9–3.3)
LYMPH%: 40.5 % (ref 14.0–48.0)
MCH: 28.3 pg (ref 26.0–34.0)
MCHC: 33.2 g/dL (ref 32.0–36.0)
MCV: 85 fL (ref 81–101)
MONO#: 0.5 10*3/uL (ref 0.1–0.9)
MONO%: 11.7 % (ref 0.0–13.0)
NEUT#: 1.6 10*3/uL (ref 1.5–6.5)
NEUT%: 42.1 % (ref 39.6–80.0)
Platelets: 188 10*3/uL (ref 145–400)
RBC: 4.38 10*6/uL (ref 3.70–5.32)
RDW: 14.2 % (ref 11.1–15.7)
WBC: 3.9 10*3/uL (ref 3.9–10.0)

## 2014-01-03 LAB — IRON AND TIBC CHCC
%SAT: 15 % — ABNORMAL LOW (ref 21–57)
Iron: 36 ug/dL — ABNORMAL LOW (ref 41–142)
TIBC: 246 ug/dL (ref 236–444)
UIBC: 210 ug/dL (ref 120–384)

## 2014-01-03 LAB — FERRITIN CHCC: Ferritin: 25 ng/ml (ref 9–269)

## 2014-01-03 MED ORDER — RIVAROXABAN 20 MG PO TABS: 20.0000 mg | ORAL_TABLET | Freq: Every day | ORAL | Status: DC

## 2014-01-04 ENCOUNTER — Telehealth: Payer: Self-pay | Admitting: *Deleted

## 2014-01-04 LAB — D-DIMER, QUANTITATIVE: D-Dimer, Quant: 3.53 ug/mL-FEU — ABNORMAL HIGH (ref 0.00–0.48)

## 2014-01-04 NOTE — Telephone Encounter (Addendum)
Message copied by Lenn Sink on Wed Jan 04, 2014 11:30 AM ------      Message from: Volanda Napoleon      Created: Tue Jan 03, 2014  9:04 PM       Call - iron is a litle low. Need Feraheme1020mg  x 1 dose.  Set up for 1-2 weeks. pete ------Spoke with pt and informed her that iron is a litle low. Need Feraheme1020mg  x 1 dose. Set up for 1-2 weeks. Set up appt with scheduler. Pt informed nurse that her left foot was hurting and the "blood veins" are "sticking out." Pt stated that this is new. Informed pt to go see PCP due to the fact we are closed this week. Pt stated understanding.

## 2014-01-10 NOTE — Progress Notes (Signed)
Hematology and Oncology Follow Up Visit  [name] [id number] 1940/11/23 73 y.o. 01/10/2014   Principle Diagnosis:   DVT of the right leg  Prothrombin II gene mutation  Hemachromatosis (homozygous for C282Y  Mutation)  Past history of right lower extremity DVT/PE  Current Therapy:   Xarelto 20 milligrams by mouth daily    Interim History:  Ms.  [name] is back for followup. She was seen back in early August. She is on Xarelto. She is worried about how much this was costing her . We will have  to try work on this. She's had no problems with the Xarelto. There is no bleeding. Her right leg feels a little better. There's no swelling in the right leg.  She's had no change in bowel or bladder habits. She's had no cough there's been no shortness of breath. She's had no headache. There's been no nausea or vomiting.  As far as the hemachromatosis goes, her ferritin today was 25. She's not had to be phlebotomized for several years.  Medications: Current outpatient prescriptions:amLODipine (NORVASC) 5 MG tablet, Take 5 mg by mouth every morning. , Disp: , Rfl: ;  Biotin 5000 MCG TABS, Take 5,000 mcg by mouth every morning. , Disp: , Rfl: ;  Black Cohosh (REMIFEMIN) 20 MG TABS, Take 20 mg by mouth 2 (two) times daily. , Disp: , Rfl: ;  calcium-vitamin D (OSCAL WITH D) 250-125 MG-UNIT per tablet, Take 1 tablet by mouth 2 (two) times daily., Disp: , Rfl:  Cholecalciferol (VITAMIN D) 2000 UNITS tablet, Take 2,000 Units by mouth 2 (two) times daily., Disp: , Rfl: ;  citalopram (CELEXA) 20 MG tablet, Take 20 mg by mouth daily after supper. , Disp: , Rfl: ;  cyclobenzaprine (FLEXERIL) 10 MG tablet, Take 10 mg by mouth 3 (three) times daily as needed for muscle spasms. , Disp: , Rfl: ;  fish oil-omega-3 fatty acids 1000 MG capsule, Take 1 g by mouth daily. , Disp: , Rfl:  hydrochlorothiazide (HYDRODIURIL) 25 MG tablet, Take 25 mg by mouth daily., Disp: , Rfl: ;  HYDROcodone-acetaminophen  (NORCO/VICODIN) 5-325 MG per tablet, Take 1 tablet by mouth every 6 (six) hours as needed (pain). , Disp: , Rfl: ;  Multiple Vitamins-Minerals (CENTRUM SILVER PO), Take 1 tablet by mouth every morning. , Disp: , Rfl: ;  omeprazole (PRILOSEC) 20 MG capsule, Take 20 mg by mouth every morning. , Disp: , Rfl:  polycarbophil (FIBERCON) 625 MG tablet, Take 625 mg by mouth 2 (two) times daily., Disp: , Rfl: ;  pramipexole (MIRAPEX) 0.5 MG tablet, Take 0.5 mg by mouth every evening., Disp: , Rfl: ;  propranolol (INDERAL) 10 MG tablet, Take 10 mg by mouth 2 (two) times daily. , Disp: , Rfl: ;  rivaroxaban (XARELTO) 20 MG TABS tablet, Take 1 tablet (20 mg total) by mouth daily with supper., Disp: 90 tablet, Rfl: 3 temazepam (RESTORIL) 30 MG capsule, Take 30 mg by mouth at bedtime. , Disp: , Rfl: ;  trimethoprim-polymyxin b (POLYTRIM) ophthalmic solution, Place 1 drop into both eyes every 8 (eight) hours. , Disp: , Rfl: ;  Turmeric Curcumin 500 MG CAPS, Take 500 mg by mouth at bedtime. , Disp: , Rfl: ;  vitamin E 400 UNIT capsule, Take 400 Units by mouth daily., Disp: , Rfl:   Allergies:  Allergies  Allergen Reactions  . Penicillins Anaphylaxis  . Morphine And Related Other (See Comments)    twitching  . Sulfa Antibiotics Other (See Comments)  unknown    Past Medical History, Surgical history, Social history, and Family History were reviewed and updated.  Review of Systems: As above  Physical Exam:  height is 5\' 3"  (1.6 m) and weight is 171 lb (77.565 kg). Her oral temperature is 97.7 F (36.5 C). Her blood pressure is 147/76 and her pulse is 55. Her respiration is 14.   Well-developed and well-nourished white female. Head and neck exam shows no ocular or oral lesions. Are no palpable cervical or supraclavicular lymph nodes. Lungs are clear. Cardiac exam regular in rhythm. Abdomen soft. Has good bowel sounds. There is no fluid wave. There is no palpable liver or spleen tip. Extremities shows no  clubbing, cyanosis or edema. No venous cord is noted in the right leg. She has no swelling in the right leg. She has good range of motion of her joints. Skin exam shows no rashes, ecchymosis or petechia. Neurological exam shows no focal neurological deficits.  Lab Results  Component Value Date   WBC 3.9 01/03/2014   HGB 12.4 01/03/2014   HCT 37.4 01/03/2014   MCV 85 01/03/2014   PLT 188 01/03/2014     Chemistry      Component Value Date/Time   NA 139 01/03/2014 1015   NA 138 11/07/2013 2143   K 3.1* 01/03/2014 1015   K 3.5* 11/07/2013 2143   CL 99 01/03/2014 1015   CL 101 11/07/2013 2143   CO2 29 01/03/2014 1015   CO2 31 08/11/2011 1524   BUN 15 01/03/2014 1015   BUN 18 11/07/2013 2143   CREATININE 0.8 01/03/2014 1015   CREATININE 1.00 11/07/2013 2143      Component Value Date/Time   CALCIUM 8.9 01/03/2014 1015   CALCIUM 9.8 08/11/2011 1524   ALKPHOS 94* 01/03/2014 1015   ALKPHOS 82 08/11/2011 1524   AST 23 01/03/2014 1015   AST 28 08/11/2011 1524   ALT 17 01/03/2014 1015   ALT 15 08/11/2011 1524   BILITOT 0.90 01/03/2014 1015   BILITOT 0.6 08/11/2011 1524         Impression and Plan: [name] is 73 year old white female with a DVT in the right leg. This happened after she fell. I am not sure what her previous DVT was. I don't think we have to admit her to lifelong anticoagulation.  We probably need to repeat a Doppler of her leg in 6 weeks.  Hopefully, one year of anticoagulation will be enough.  The hemachromatosis is no problem for [place]. She does not need to be phlebotomized.   Volanda Napoleon, MD 9/15/20157:03 AM

## 2014-01-12 ENCOUNTER — Ambulatory Visit: Payer: Medicare Other

## 2014-01-12 MED ORDER — SODIUM CHLORIDE 0.9 % IV SOLN
1020.0000 mg | Freq: Once | INTRAVENOUS | Status: DC
  Filled 2014-01-12: qty 34

## 2014-01-12 NOTE — Progress Notes (Signed)
Reviewed labs with Dr. Marin Olp, will not give Feraheme today.  Explained to pt , verbalized understanding.

## 2014-02-09 ENCOUNTER — Other Ambulatory Visit (HOSPITAL_BASED_OUTPATIENT_CLINIC_OR_DEPARTMENT_OTHER): Payer: Medicare Other | Admitting: Lab

## 2014-02-09 ENCOUNTER — Ambulatory Visit (HOSPITAL_BASED_OUTPATIENT_CLINIC_OR_DEPARTMENT_OTHER): Payer: Medicare Other | Admitting: Family

## 2014-02-09 ENCOUNTER — Ambulatory Visit (HOSPITAL_BASED_OUTPATIENT_CLINIC_OR_DEPARTMENT_OTHER)
Admission: RE | Admit: 2014-02-09 | Discharge: 2014-02-09 | Disposition: A | Discharge status: Home / Self Care | Payer: Medicare Other | Source: Ambulatory Visit | Attending: Hematology & Oncology | Admitting: Hematology & Oncology

## 2014-02-09 ENCOUNTER — Encounter: Payer: Self-pay | Admitting: Family

## 2014-02-09 DIAGNOSIS — I82401 Acute embolism and thrombosis of unspecified deep veins of right lower extremity: Secondary | ICD-10-CM

## 2014-02-09 LAB — CBC WITH DIFFERENTIAL (CANCER CENTER ONLY)
BASO#: 0 10*3/uL (ref 0.0–0.2)
BASO%: 0.6 % (ref 0.0–2.0)
EOS%: 4.7 % (ref 0.0–7.0)
Eosinophils Absolute: 0.2 10*3/uL (ref 0.0–0.5)
HEMATOCRIT: 36 % (ref 34.8–46.6)
HGB: 11.8 g/dL (ref 11.6–15.9)
LYMPH#: 1.6 10*3/uL (ref 0.9–3.3)
LYMPH%: 32 % (ref 14.0–48.0)
MCH: 27.8 pg (ref 26.0–34.0)
MCHC: 32.8 g/dL (ref 32.0–36.0)
MCV: 85 fL (ref 81–101)
MONO#: 0.6 10*3/uL (ref 0.1–0.9)
MONO%: 12.2 % (ref 0.0–13.0)
NEUT#: 2.5 10*3/uL (ref 1.5–6.5)
NEUT%: 50.5 % (ref 39.6–80.0)
PLATELETS: 161 10*3/uL (ref 145–400)
RBC: 4.25 10*6/uL (ref 3.70–5.32)
RDW: 14.5 % (ref 11.1–15.7)
WBC: 4.9 10*3/uL (ref 3.9–10.0)

## 2014-02-09 LAB — RETICULOCYTES (CHCC)
ABS Retic: 43.1 10*3/uL (ref 19.0–186.0)
RBC.: 4.31 MIL/uL (ref 3.87–5.11)
RETIC CT PCT: 1 % (ref 0.4–2.3)

## 2014-02-09 LAB — CMP (CANCER CENTER ONLY)
ALT(SGPT): 23 U/L (ref 10–47)
AST: 27 U/L (ref 11–38)
Albumin: 3.5 g/dL (ref 3.3–5.5)
Alkaline Phosphatase: 82 U/L (ref 26–84)
BUN: 17 mg/dL (ref 7–22)
CALCIUM: 9.4 mg/dL (ref 8.0–10.3)
CHLORIDE: 98 meq/L (ref 98–108)
CO2: 29 mEq/L (ref 18–33)
Creat: 0.9 mg/dl (ref 0.6–1.2)
Glucose, Bld: 91 mg/dL (ref 73–118)
Potassium: 3.6 mEq/L (ref 3.3–4.7)
Sodium: 139 mEq/L (ref 128–145)
Total Bilirubin: 0.7 mg/dl (ref 0.20–1.60)
Total Protein: 7.1 g/dL (ref 6.4–8.1)

## 2014-02-09 LAB — IRON AND TIBC CHCC
%SAT: 27 % (ref 21–57)
IRON: 71 ug/dL (ref 41–142)
TIBC: 258 ug/dL (ref 236–444)
UIBC: 187 ug/dL (ref 120–384)

## 2014-02-09 LAB — FERRITIN CHCC: Ferritin: 25 ng/ml (ref 9–269)

## 2014-02-09 LAB — CHCC SATELLITE - SMEAR

## 2014-02-09 NOTE — Progress Notes (Signed)
[facility]  [phone] Fax:(336) [phone]  ID: [name] OB: 1940/08/28 MR#: [id number]#:[id number] Patient Care Team: Shirline Frees, MD as PCP - General (Family Medicine)  DIAGNOSIS: DVT of the right leg  Prothrombin II gene mutation  Hemachromatosis (homozygous for C282Y Mutation)  Past history of right lower extremity DVT/PE  INTERVAL HISTORY: Ms. Schmelzer is here today for a follow-up. She is doing well with her xarelto. She has not had any bleeding. Her doppler study of the right leg today looks improved. Her right leg does hurt sometimes but there has been no swelling. She denies fever, chills, n/v, rash, headaches, dizziness, SOB, chest pain, palpitations, abdominal pain, constipation, diarrhea, blood in urine or stool. She has had no swelling, tendernes, numbness or tingling  Her hemachromatosis has not been a problem. Her last phlebotomy was several years ago. She has an upper respiratory infection with a nonproductive cough so we will hold off on the flu shot for now until she is feeling better.   CURRENT TREATMENT: Xarelto 20 milligrams by mouth daily   REVIEW OF SYSTEMS: All other 10 point review of systems is negative.   PAST MEDICAL HISTORY: Past Medical History  Diagnosis Date  . Hypertension   . PE (pulmonary embolism)   . Hemochromatosis   . Prothrombin gene mutation 05/30/2013  . Right leg DVT 05/30/2013    PAST SURGICAL HISTORY: Past Surgical History  Procedure Laterality Date  . Back surgery    . Shoulder surgery    . Foot surgery      FAMILY HISTORY History reviewed. No pertinent family history.  GYNECOLOGIC HISTORY:  No LMP recorded. Patient is postmenopausal.   SOCIAL HISTORY: History   Social History  . Marital Status: Married    Spouse Name: N/A    Number of Children: N/A  . Years of Education: N/A   Occupational History  . Not on file.   Social History Main Topics  . Smoking status: Never Smoker   .  Smokeless tobacco: Never Used     Comment: never used tobacco  . Alcohol Use: No  . Drug Use: No  . Sexual Activity: Not on file   Other Topics Concern  . Not on file   Social History Narrative  . No narrative on file    ADVANCED DIRECTIVES:  <no information>  HEALTH MAINTENANCE: History  Substance Use Topics  . Smoking status: Never Smoker   . Smokeless tobacco: Never Used     Comment: never used tobacco  . Alcohol Use: No   Colonoscopy: PAP: Bone density: Lipid panel:  Allergies  Allergen Reactions  . Penicillins Anaphylaxis  . Morphine And Related Other (See Comments)    twitching  . Sulfa Antibiotics Other (See Comments)    unknown    Current Outpatient Prescriptions  Medication Sig Dispense Refill  . amLODipine (NORVASC) 5 MG tablet Take 5 mg by mouth every morning.       . Biotin 5000 MCG TABS Take 5,000 mcg by mouth every morning.       . Black Cohosh (REMIFEMIN) 20 MG TABS Take 20 mg by mouth 2 (two) times daily.       . calcium-vitamin D (OSCAL WITH D) 250-125 MG-UNIT per tablet Take 1 tablet by mouth 2 (two) times daily.      . Cholecalciferol (VITAMIN D) 2000 UNITS tablet Take 2,000 Units by mouth 2 (two) times daily.      . citalopram (CELEXA) 20 MG tablet Take  20 mg by mouth daily after supper.       . cyclobenzaprine (FLEXERIL) 10 MG tablet Take 10 mg by mouth 3 (three) times daily as needed for muscle spasms.       . fish oil-omega-3 fatty acids 1000 MG capsule Take 1 g by mouth daily.       . hydrochlorothiazide (HYDRODIURIL) 25 MG tablet Take 25 mg by mouth daily.      Marland Kitchen HYDROcodone-acetaminophen (NORCO/VICODIN) 5-325 MG per tablet Take 1 tablet by mouth every 6 (six) hours as needed (pain).       . Multiple Vitamins-Minerals (CENTRUM SILVER PO) Take 1 tablet by mouth every morning.       Marland Kitchen omeprazole (PRILOSEC) 20 MG capsule Take 20 mg by mouth every morning.       . polycarbophil (FIBERCON) 625 MG tablet Take 625 mg by mouth 2 (two) times daily.       . pramipexole (MIRAPEX) 0.5 MG tablet Take 0.5 mg by mouth every evening.      . propranolol (INDERAL) 10 MG tablet Take 10 mg by mouth 2 (two) times daily.       . rivaroxaban (XARELTO) 20 MG TABS tablet Take 1 tablet (20 mg total) by mouth daily with supper.  90 tablet  3  . temazepam (RESTORIL) 30 MG capsule Take 30 mg by mouth at bedtime.       Marland Kitchen trimethoprim-polymyxin b (POLYTRIM) ophthalmic solution Place 1 drop into both eyes every 8 (eight) hours.       . Turmeric Curcumin 500 MG CAPS Take 500 mg by mouth at bedtime.       . vitamin E 400 UNIT capsule Take 400 Units by mouth daily.       No current facility-administered medications for this visit.    OBJECTIVE: Filed Vitals:   02/09/14 1039  BP: 152/78  Pulse: 56  Temp: 97.8 F (36.6 C)  Resp: 18    Filed Weights   02/09/14 1039  Weight: 176 lb (79.833 kg)   ECOG FS:0 - Asymptomatic Ocular: Sclerae unicteric, pupils equal, round and reactive to light Ear-nose-throat: Oropharynx clear, dentition fair Lymphatic: No cervical or supraclavicular adenopathy Lungs no rales or rhonchi, good excursion bilaterally Heart regular rate and rhythm, no murmur appreciated Abd soft, nontender, positive bowel sounds MSK no focal spinal tenderness, no joint edema Neuro: non-focal, well-oriented, appropriate affect Breasts: Deferred  LAB RESULTS: CMP     Component Value Date/Time   NA 139 02/09/2014 1016   NA 138 11/07/2013 2143   K 3.6 02/09/2014 1016   K 3.5* 11/07/2013 2143   CL 98 02/09/2014 1016   CL 101 11/07/2013 2143   CO2 29 02/09/2014 1016   CO2 31 08/11/2011 1524   GLUCOSE 91 02/09/2014 1016   GLUCOSE 87 11/07/2013 2143   BUN 17 02/09/2014 1016   BUN 18 11/07/2013 2143   CREATININE 0.9 02/09/2014 1016   CREATININE 1.00 11/07/2013 2143   CALCIUM 9.4 02/09/2014 1016   CALCIUM 9.8 08/11/2011 1524   PROT 7.1 02/09/2014 1016   PROT 6.9 08/11/2011 1524   ALBUMIN 4.0 08/11/2011 1524   AST 27 02/09/2014 1016   AST 28  08/11/2011 1524   ALT 23 02/09/2014 1016   ALT 15 08/11/2011 1524   ALKPHOS 82 02/09/2014 1016   ALKPHOS 82 08/11/2011 1524   BILITOT 0.70 02/09/2014 1016   BILITOT 0.6 08/11/2011 1524   GFRNONAA 48* 11/27/2010 0455   GFRAA 58* 11/27/2010 0455  No results found for this basename: SPEP, UPEP,  kappa and lambda light chains   Lab Results  Component Value Date   WBC 4.9 02/09/2014   NEUTROABS 2.5 02/09/2014   HGB 11.8 02/09/2014   HCT 36.0 02/09/2014   MCV 85 02/09/2014   PLT 161 02/09/2014   No results found for this basename: LABCA2   No components found with this basename: [id number]   No results found for this basename: INR,  in the last 168 hours  STUDIES: US Venous Img Lower Unilateral Right  02/09/2014   CLINICAL DATA:  Follow-up right lower extremity DVT.  EXAM: RIGHT LOWER EXTREMITY VENOUS DOPPLER ULTRASOUND  TECHNIQUE: Gray-scale sonography with graded compression, as well as color Doppler and duplex ultrasound, were performed to evaluate the deep venous system from the level of the common femoral vein through the popliteal and proximal calf veins. Spectral Doppler was utilized to evaluate flow at rest and with distal augmentation maneuvers.  COMPARISON:  Right lower extremity venous duplex on 11/07/2013 at [facility] vascular lab. Report was reviewed.  FINDINGS: Right common femoral vein and right saphenofemoral junction are patent with color Doppler flow. The proximal and mid right femoral vein are non compressible without significant flow. The distal right femoral vein demonstrates partial compressibility with a small amount of flow. The right popliteal vein is now compressible with color Doppler flow. The visualized right deep calf veins are patent. Right great saphenous vein is patent.  IMPRESSION: Persistent deep vein thrombus in the right femoral vein. This deep vein thrombosis appears to be chronic. There appears to be improved flow in the right popliteal vein compared to the previous  examination.   Electronically Signed   By: Markus Daft M.D.   On: 02/09/2014 10:17   ASSESSMENT/PLAN: Ms. Batte is 73 year old white female with a DVT in the right leg. This happened after she fell. She is doing much better. Her swelling is gone. There is some tenderness at times.  She will continue taking the Xarelto. Hopefully she will only need one full year of this. The hemachromatosis is no problem for [place]. She does not need to be phlebotomized. Her doppler study today showed improvement.   Her CBC today looked good.  We will see her back in 3 months for labs and follow-up. She knows to call here with any questions or concerns and to go to the ED in the event of an emergency. We can certainly see her sooner if need be.   Eliezer Bottom, NP 02/09/2014 1:04 PM

## 2014-03-24 ENCOUNTER — Encounter (HOSPITAL_COMMUNITY): Payer: Self-pay | Admitting: *Deleted

## 2014-03-24 ENCOUNTER — Emergency Department (HOSPITAL_COMMUNITY): Payer: Medicare Other

## 2014-03-24 ENCOUNTER — Emergency Department (HOSPITAL_COMMUNITY)
Admission: EM | Admit: 2014-03-24 | Discharge: 2014-03-24 | Disposition: A | Discharge status: Home / Self Care | Payer: Medicare Other | Attending: Emergency Medicine | Admitting: Emergency Medicine

## 2014-03-24 DIAGNOSIS — Z862 Personal history of diseases of the blood and blood-forming organs and certain disorders involving the immune mechanism: Secondary | ICD-10-CM | POA: Diagnosis not present

## 2014-03-24 DIAGNOSIS — W010XXA Fall on same level from slipping, tripping and stumbling without subsequent striking against object, initial encounter: Secondary | ICD-10-CM | POA: Insufficient documentation

## 2014-03-24 DIAGNOSIS — Y9389 Activity, other specified: Secondary | ICD-10-CM | POA: Diagnosis not present

## 2014-03-24 DIAGNOSIS — Z043 Encounter for examination and observation following other accident: Secondary | ICD-10-CM | POA: Insufficient documentation

## 2014-03-24 DIAGNOSIS — Z86711 Personal history of pulmonary embolism: Secondary | ICD-10-CM | POA: Diagnosis not present

## 2014-03-24 DIAGNOSIS — Z86718 Personal history of other venous thrombosis and embolism: Secondary | ICD-10-CM | POA: Diagnosis not present

## 2014-03-24 DIAGNOSIS — Z8639 Personal history of other endocrine, nutritional and metabolic disease: Secondary | ICD-10-CM | POA: Diagnosis not present

## 2014-03-24 DIAGNOSIS — W19XXXA Unspecified fall, initial encounter: Secondary | ICD-10-CM

## 2014-03-24 DIAGNOSIS — Z880 Allergy status to penicillin: Secondary | ICD-10-CM | POA: Insufficient documentation

## 2014-03-24 DIAGNOSIS — Z79899 Other long term (current) drug therapy: Secondary | ICD-10-CM | POA: Diagnosis not present

## 2014-03-24 DIAGNOSIS — Y998 Other external cause status: Secondary | ICD-10-CM | POA: Diagnosis not present

## 2014-03-24 DIAGNOSIS — I10 Essential (primary) hypertension: Secondary | ICD-10-CM | POA: Insufficient documentation

## 2014-03-24 DIAGNOSIS — Y9289 Other specified places as the place of occurrence of the external cause: Secondary | ICD-10-CM | POA: Insufficient documentation

## 2014-03-24 NOTE — ED Provider Notes (Signed)
CSN: [id number]     Arrival date & time 03/24/14  1352 History   First MD Initiated Contact with Patient 03/24/14 1546     Chief Complaint  Patient presents with  . Fall     (Consider location/radiation/quality/duration/timing/severity/associated sxs/prior Treatment) HPI Comments: Patient presents to the emergency department with chief complaint of mechanical fall. She states that she tripped over her Christmas tree, fell to the ground from standing. She states that she struck her right cheek on the ground. She denies any LOC. Denies any vision changes or weakness. She denies any headache or other injury. She states that she does want to be "checked out" because she is taking Xeralto.  She states that she feels well now. She is able ambulate without difficulty. There are no aggravating or relieving factors.  The history is provided by the patient. No language interpreter was used.    Past Medical History  Diagnosis Date  . Hypertension   . PE (pulmonary embolism)   . Hemochromatosis   . Prothrombin gene mutation 05/30/2013  . Right leg DVT 05/30/2013   Past Surgical History  Procedure Laterality Date  . Back surgery    . Shoulder surgery    . Foot surgery     History reviewed. No pertinent family history. History  Substance Use Topics  . Smoking status: Never Smoker   . Smokeless tobacco: Never Used     Comment: never used tobacco  . Alcohol Use: No   OB History    No data available     Review of Systems  Constitutional: Negative for fever and chills.  Respiratory: Negative for shortness of breath.   Cardiovascular: Negative for chest pain.  Gastrointestinal: Negative for nausea, vomiting, diarrhea and constipation.  Genitourinary: Negative for dysuria.  All other systems reviewed and are negative.     Allergies  Penicillins; Morphine and related; and Sulfa antibiotics  Home Medications   Prior to Admission medications   Medication Sig Start Date End Date  Taking? Authorizing Provider  amLODipine (NORVASC) 5 MG tablet Take 5 mg by mouth every morning.  07/23/11   Historical Provider, MD  Biotin 5000 MCG TABS Take 5,000 mcg by mouth every morning.     Historical Provider, MD  Black Cohosh (REMIFEMIN) 20 MG TABS Take 20 mg by mouth 2 (two) times daily.     Historical Provider, MD  calcium-vitamin D (OSCAL WITH D) 250-125 MG-UNIT per tablet Take 1 tablet by mouth 2 (two) times daily.    Historical Provider, MD  Cholecalciferol (VITAMIN D) 2000 UNITS tablet Take 2,000 Units by mouth 2 (two) times daily.    Historical Provider, MD  citalopram (CELEXA) 20 MG tablet Take 20 mg by mouth daily after supper.  08/05/11   Historical Provider, MD  cyclobenzaprine (FLEXERIL) 10 MG tablet Take 10 mg by mouth 3 (three) times daily as needed for muscle spasms.  12/16/11   Historical Provider, MD  fish oil-omega-3 fatty acids 1000 MG capsule Take 1 g by mouth daily.     Historical Provider, MD  hydrochlorothiazide (HYDRODIURIL) 25 MG tablet Take 25 mg by mouth daily.    Historical Provider, MD  HYDROcodone-acetaminophen (NORCO/VICODIN) 5-325 MG per tablet Take 1 tablet by mouth every 6 (six) hours as needed (pain).  04/08/13   Historical Provider, MD  Multiple Vitamins-Minerals (CENTRUM SILVER PO) Take 1 tablet by mouth every morning.     Historical Provider, MD  omeprazole (PRILOSEC) 20 MG capsule Take 20 mg by mouth every  morning.  08/05/11   Historical Provider, MD  polycarbophil (FIBERCON) 625 MG tablet Take 625 mg by mouth 2 (two) times daily.    Historical Provider, MD  pramipexole (MIRAPEX) 0.5 MG tablet Take 0.5 mg by mouth every evening.    Historical Provider, MD  propranolol (INDERAL) 10 MG tablet Take 10 mg by mouth 2 (two) times daily.  07/23/11   Historical Provider, MD  rivaroxaban (XARELTO) 20 MG TABS tablet Take 1 tablet (20 mg total) by mouth daily with supper. 01/03/14   Volanda Napoleon, MD  temazepam (RESTORIL) 30 MG capsule Take 30 mg by mouth at bedtime.   07/24/11   Historical Provider, MD  trimethoprim-polymyxin b (POLYTRIM) ophthalmic solution Place 1 drop into both eyes every 8 (eight) hours.  10/08/13   Historical Provider, MD  Turmeric Curcumin 500 MG CAPS Take 500 mg by mouth at bedtime.     Historical Provider, MD  vitamin E 400 UNIT capsule Take 400 Units by mouth daily.    Historical Provider, MD   BP 142/74 mmHg  Pulse 60  Temp(Src) 98 F (36.7 C) (Oral)  Resp 18  Ht 5\' 3"  (1.6 m)  Wt 170 lb (77.111 kg)  BMI 30.12 kg/m2  SpO2 95% Physical Exam  Constitutional: She is oriented to person, place, and time. She appears well-developed and well-nourished.  HENT:  Head: Normocephalic and atraumatic.  Right Ear: External ear normal.  Left Ear: External ear normal.  Nose: Nose normal.  Mouth/Throat: Oropharynx is clear and moist. No oropharyngeal exudate.  No hematoma, no evidence of scalp or skull injury  Eyes: Conjunctivae and EOM are normal. Pupils are equal, round, and reactive to light.  Neck: Normal range of motion. Neck supple.  Cardiovascular: Normal rate and regular rhythm.  Exam reveals no gallop and no friction rub.   No murmur heard. Pulmonary/Chest: Effort normal and breath sounds normal. No respiratory distress. She has no wheezes. She has no rales. She exhibits no tenderness.  Abdominal: Soft. Bowel sounds are normal. She exhibits no distension and no mass. There is no tenderness. There is no rebound and no guarding.  Musculoskeletal: Normal range of motion. She exhibits no edema or tenderness.  No CTLS spine tenderness, range of motion and strength all extremities 5/5  Neurological: She is alert and oriented to person, place, and time.  Skin: Skin is warm and dry.  Psychiatric: She has a normal mood and affect. Her behavior is normal. Judgment and thought content normal.  Nursing note and vitals reviewed.   ED Course  Procedures (including critical care time) Labs Review Labs Reviewed - No data to  display  Imaging Review Ct Head Wo Contrast  03/24/2014   CLINICAL DATA:  Frequent falls, dizziness, RIGHT frontal and cheek contusions, history stroke, hypertension  EXAM: CT HEAD WITHOUT CONTRAST  TECHNIQUE: Contiguous axial images were obtained from the base of the skull through the vertex without intravenous contrast.  COMPARISON:  11/24/2010  FINDINGS: Normal ventricular morphology.  No midline shift or mass effect.  Minimal small vessel chronic ischemic changes of deep cerebral white matter.  Old LEFT fall thalamic lacunar infarct.  In addition, probable small BILATERAL old deep cerebellar lacunar infarcts.  No intracranial hemorrhage, mass lesion, or evidence acute infarction.  No extra-axial fluid collections.  Incomplete posterior arch C1, normal variant.  Chronic sphenoid sinusitis.  No acute osseous findings.  IMPRESSION: Small vessel chronic ischemic changes of deep cerebral white matter.  Old LEFT thalamic and BILATERAL cerebellar lacunar  infarcts.  No acute intracranial abnormalities.   Electronically Signed   By: Lavonia Dana M.D.   On: 03/24/2014 14:45     EKG Interpretation None      MDM   Final diagnoses:  Fall, initial encounter    Patient with mechanical fall. No LOC. She is currently taking Xeralto.  She states that she feels well now.  Seen by and discussed with Dr. Venora Maples, who agrees with the plan.  She is stable and ready for discharge.    Montine Circle, PA-C 03/24/14 [place], MD 03/25/14 [phone]

## 2014-03-24 NOTE — ED Notes (Signed)
Pt in stating she fell at home today, hit her right cheek on the ground, denies LOC, pt takes xarelto, denies headache

## 2014-03-24 NOTE — Discharge Instructions (Signed)
Fall Prevention and Home Safety Falls cause injuries and can affect all age groups. It is possible to use preventive measures to significantly decrease the likelihood of falls. There are many simple measures which can make your home safer and prevent falls. OUTDOORS  Repair cracks and edges of walkways and driveways.  Remove high doorway thresholds.  Trim shrubbery on the main path into your home.  Have good outside lighting.  Clear walkways of tools, rocks, debris, and clutter.  Check that handrails are not broken and are securely fastened. Both sides of steps should have handrails.  Have leaves, snow, and ice cleared regularly.  Use sand or salt on walkways during winter months.  In the garage, clean up grease or oil spills. BATHROOM  Install night lights.  Install grab bars by the toilet and in the tub and shower.  Use non-skid mats or decals in the tub or shower.  Place a plastic non-slip stool in the shower to sit on, if needed.  Keep floors dry and clean up all water on the floor immediately.  Remove soap buildup in the tub or shower on a regular basis.  Secure bath mats with non-slip, double-sided rug tape.  Remove throw rugs and tripping hazards from the floors. BEDROOMS  Install night lights.  Make sure a bedside light is easy to reach.  Do not use oversized bedding.  Keep a telephone by your bedside.  Have a firm chair with side arms to use for getting dressed.  Remove throw rugs and tripping hazards from the floor. KITCHEN  Keep handles on pots and pans turned toward the center of the stove. Use back burners when possible.  Clean up spills quickly and allow time for drying.  Avoid walking on wet floors.  Avoid hot utensils and knives.  Position shelves so they are not too high or low.  Place commonly used objects within easy reach.  If necessary, use a sturdy step stool with a grab bar when reaching.  Keep electrical cables out of the  way.  Do not use floor polish or wax that makes floors slippery. If you must use wax, use non-skid floor wax.  Remove throw rugs and tripping hazards from the floor. STAIRWAYS  Never leave objects on stairs.  Place handrails on both sides of stairways and use them. Fix any loose handrails. Make sure handrails on both sides of the stairways are as long as the stairs.  Check carpeting to make sure it is firmly attached along stairs. Make repairs to worn or loose carpet promptly.  Avoid placing throw rugs at the top or bottom of stairways, or properly secure the rug with carpet tape to prevent slippage. Get rid of throw rugs, if possible.  Have an electrician put in a light switch at the top and bottom of the stairs. OTHER FALL PREVENTION TIPS  Wear low-heel or rubber-soled shoes that are supportive and fit well. Wear closed toe shoes.  When using a stepladder, make sure it is fully opened and both spreaders are firmly locked. Do not climb a closed stepladder.  Add color or contrast paint or tape to grab bars and handrails in your home. Place contrasting color strips on first and last steps.  Learn and use mobility aids as needed. Install an electrical emergency response system.  Turn on lights to avoid dark areas. Replace light bulbs that burn out immediately. Get light switches that glow.  Arrange furniture to create clear pathways. Keep furniture in the same place.  Firmly attach carpet with non-skid or double-sided tape. °· Eliminate uneven floor surfaces. °· Select a carpet pattern that does not visually hide the edge of steps. °· Be aware of all pets. °OTHER HOME SAFETY TIPS °· Set the water temperature for 120° F (48.8° C). °· Keep emergency numbers on or near the telephone. °· Keep smoke detectors on every level of the home and near sleeping areas. °Document Released: 04/04/2002 Document Revised: 10/14/2011 Document Reviewed: 07/04/2011 °[employer]® Patient Information ©2015  [employer]. This information is not intended to replace advice given to you by your health care provider. Make sure you discuss any questions you have with your health care provider. ° ° °Head Injury °You have received a head injury. It does not appear serious at this time. Headaches and vomiting are common following head injury. It should be easy to awaken from sleeping. Sometimes it is necessary for you to stay in the emergency department for a while for observation. Sometimes admission to the hospital may be needed. After injuries such as yours, most problems occur within the first 24 hours, but side effects may occur up to 7-10 days after the injury. It is important for you to carefully monitor your condition and contact your health care provider or seek immediate medical care if there is a change in your condition. °WHAT ARE THE TYPES OF HEAD INJURIES? °Head injuries can be as minor as a bump. Some head injuries can be more severe. More severe head injuries include: °· A jarring injury to the brain (concussion). °· A bruise of the brain (contusion). This mean there is bleeding in the brain that can cause swelling. °· A cracked skull (skull fracture). °· Bleeding in the brain that collects, clots, and forms a bump (hematoma). °WHAT CAUSES A HEAD INJURY? °A serious head injury is most likely to happen to someone who is in a car wreck and is not wearing a seat belt. Other causes of major head injuries include bicycle or motorcycle accidents, sports injuries, and falls. °HOW ARE HEAD INJURIES DIAGNOSED? °A complete history of the event leading to the injury and your current symptoms will be helpful in diagnosing head injuries. Many times, pictures of the brain, such as CT or MRI are needed to see the extent of the injury. Often, an overnight hospital stay is necessary for observation.  °WHEN SHOULD I SEEK IMMEDIATE MEDICAL CARE?  °You should get help right away if: °· You have confusion or drowsiness. °· You  feel sick to your stomach (nauseous) or have continued, forceful vomiting. °· You have dizziness or unsteadiness that is getting worse. °· You have severe, continued headaches not relieved by medicine. Only take over-the-counter or prescription medicines for pain, fever, or discomfort as directed by your health care provider. °· You do not have normal function of the arms or legs or are unable to walk. °· You notice changes in the black spots in the center of the colored part of your eye (pupil). °· You have a clear or bloody fluid coming from your nose or ears. °· You have a loss of vision. °During the next 24 hours after the injury, you must stay with someone who can watch you for the warning signs. This person should contact local emergency services (911 in the [place] if you have seizures, you become unconscious, or you are unable to wake up. °HOW CAN I PREVENT A HEAD INJURY IN THE FUTURE? °The most important factor for preventing major head injuries is avoiding motor   vehicle accidents.  To minimize the potential for damage to your head, it is crucial to wear seat belts while riding in motor vehicles. Wearing helmets while bike riding and playing collision sports (like football) is also helpful. Also, avoiding dangerous activities around the house will further help reduce your risk of head injury.  °WHEN CAN I RETURN TO NORMAL ACTIVITIES AND ATHLETICS? °You should be reevaluated by your health care provider before returning to these activities. If you have any of the following symptoms, you should not return to activities or contact sports until 1 week after the symptoms have stopped: °· Persistent headache. °· Dizziness or vertigo. °· Poor attention and concentration. °· Confusion. °· Memory problems. °· Nausea or vomiting. °· Fatigue or tire easily. °· Irritability. °· Intolerant of bright lights or loud noises. °· Anxiety or depression. °· Disturbed sleep. °MAKE SURE YOU:  °· Understand these  instructions. °· Will watch your condition. °· Will get help right away if you are not doing well or get worse. °Document Released: 04/14/2005 Document Revised: 04/19/2013 Document Reviewed: 12/20/2012 °Exit[employer]® Patient Information ©2015 [employer]. This information is not intended to replace advice given to you by your health care provider. Make sure you discuss any questions you have with your health care provider. ° °

## 2014-04-20 ENCOUNTER — Encounter (HOSPITAL_COMMUNITY): Payer: Self-pay

## 2014-04-20 ENCOUNTER — Emergency Department (HOSPITAL_COMMUNITY)
Admission: EM | Admit: 2014-04-20 | Discharge: 2014-04-20 | Disposition: A | Discharge status: Home / Self Care | Payer: Medicare Other | Attending: Emergency Medicine | Admitting: Emergency Medicine

## 2014-04-20 ENCOUNTER — Emergency Department (HOSPITAL_COMMUNITY): Payer: Medicare Other

## 2014-04-20 DIAGNOSIS — Z86711 Personal history of pulmonary embolism: Secondary | ICD-10-CM | POA: Diagnosis not present

## 2014-04-20 DIAGNOSIS — Z8639 Personal history of other endocrine, nutritional and metabolic disease: Secondary | ICD-10-CM | POA: Diagnosis not present

## 2014-04-20 DIAGNOSIS — Y92000 Kitchen of unspecified non-institutional (private) residence as  the place of occurrence of the external cause: Secondary | ICD-10-CM | POA: Diagnosis not present

## 2014-04-20 DIAGNOSIS — Z880 Allergy status to penicillin: Secondary | ICD-10-CM | POA: Diagnosis not present

## 2014-04-20 DIAGNOSIS — Y998 Other external cause status: Secondary | ICD-10-CM | POA: Insufficient documentation

## 2014-04-20 DIAGNOSIS — Z7901 Long term (current) use of anticoagulants: Secondary | ICD-10-CM | POA: Diagnosis not present

## 2014-04-20 DIAGNOSIS — Z862 Personal history of diseases of the blood and blood-forming organs and certain disorders involving the immune mechanism: Secondary | ICD-10-CM | POA: Diagnosis not present

## 2014-04-20 DIAGNOSIS — Z86718 Personal history of other venous thrombosis and embolism: Secondary | ICD-10-CM | POA: Diagnosis not present

## 2014-04-20 DIAGNOSIS — W01198A Fall on same level from slipping, tripping and stumbling with subsequent striking against other object, initial encounter: Secondary | ICD-10-CM | POA: Diagnosis not present

## 2014-04-20 DIAGNOSIS — I10 Essential (primary) hypertension: Secondary | ICD-10-CM | POA: Insufficient documentation

## 2014-04-20 DIAGNOSIS — S0990XA Unspecified injury of head, initial encounter: Secondary | ICD-10-CM | POA: Diagnosis not present

## 2014-04-20 DIAGNOSIS — Y9389 Activity, other specified: Secondary | ICD-10-CM | POA: Insufficient documentation

## 2014-04-20 DIAGNOSIS — Z79899 Other long term (current) drug therapy: Secondary | ICD-10-CM | POA: Diagnosis not present

## 2014-04-20 DIAGNOSIS — W19XXXA Unspecified fall, initial encounter: Secondary | ICD-10-CM

## 2014-04-20 MED ORDER — ACETAMINOPHEN 500 MG PO TABS
1000.0000 mg | ORAL_TABLET | Freq: Once | ORAL | Status: AC
  Administered 2014-04-20: 1000 mg via ORAL
  Filled 2014-04-20: qty 2

## 2014-04-20 NOTE — ED Notes (Signed)
Pt placed in room changing into gown.  Herbie [place] EMT aware.

## 2014-04-20 NOTE — ED Notes (Signed)
Pt was in kitchen tonight heating up something when she turned she fell hitting onto her on her back, pt hit head, hematoma to back of head, no LOC no other injuries

## 2014-04-20 NOTE — Discharge Instructions (Signed)
Fall Prevention and Home Safety Ms. Kammerer, you were seen today after a fall. Your CT scan is negative for a bleed in your brain. Take Tylenol as needed for pain and follow-up with your primary care physician within one week for continued management. Because you fall so often, you need to discuss stopping Xarelto with your primary care physician. For any worsening come back to the emergency department and medially for repeat evaluation. Thank you. Falls cause injuries and can affect all age groups. It is possible to prevent falls.  HOW TO PREVENT FALLS  Wear shoes with rubber soles that do not have an opening for your toes.  Keep the inside and outside of your house well lit.  Use night lights throughout your home.  Remove clutter from floors.  Clean up floor spills.  Remove throw rugs or fasten them to the floor with carpet tape.  Do not place electrical cords across pathways.  Put grab bars by your tub, shower, and toilet. Do not use towel bars as grab bars.  Put handrails on both sides of the stairway. Fix loose handrails.  Do not climb on stools or stepladders, if possible.  Do not wax your floors.  Repair uneven or unsafe sidewalks, walkways, or stairs.  Keep items you use a lot within reach.  Be aware of pets.  Keep emergency numbers next to the telephone.  Put smoke detectors in your home and near bedrooms. Ask your doctor what other things you can do to prevent falls. Document Released: 02/08/2009 Document Revised: 10/14/2011 Document Reviewed: 07/15/2011 [facility] Patient Information 2015 [place], [place]. This information is not intended to replace advice given to you by your health care provider. Make sure you discuss any questions you have with your health care provider.

## 2014-04-20 NOTE — ED Provider Notes (Signed)
CSN: [id number]     Arrival date & time 04/20/14  0401 History   First MD Initiated Contact with Patient 04/20/14 0414     No chief complaint on file.    (Consider location/radiation/quality/duration/timing/severity/associated sxs/prior Treatment) HPI Ms. Garramone is a 73 year old female with a past medical history of DVT and pulmonary embolism, currently on Xarelto, here after falling. This occurred immediately prior to arrival as she was going downstairs to get a wrap for her shoulder. She tripped over her bar stools and fell backwards hitting the back of her head. She denies any LOC. She has swelling to the back of her head as well as tenderness. She did not take anything for pain prior to arrival. Patient denies injury anywhere else. She denies any bleeding at the time. Patient has no further complaints.   10 Systems reviewed and are negative for acute change except as noted in the HPI.    Past Medical History  Diagnosis Date  . Hypertension   . PE (pulmonary embolism)   . Hemochromatosis   . Prothrombin gene mutation 05/30/2013  . Right leg DVT 05/30/2013   Past Surgical History  Procedure Laterality Date  . Back surgery    . Shoulder surgery    . Foot surgery     No family history on file. History  Substance Use Topics  . Smoking status: Never Smoker   . Smokeless tobacco: Never Used     Comment: never used tobacco  . Alcohol Use: No   OB History    No data available     Review of Systems    Allergies  Penicillins; Morphine and related; and Sulfa antibiotics  Home Medications   Prior to Admission medications   Medication Sig Start Date End Date Taking? Authorizing Provider  amLODipine (NORVASC) 5 MG tablet Take 5 mg by mouth every morning.  07/23/11   Historical Provider, MD  Biotin 5000 MCG TABS Take 5,000 mcg by mouth every morning.     Historical Provider, MD  Black Cohosh (REMIFEMIN) 20 MG TABS Take 20 mg by mouth 2 (two) times daily.     Historical Provider,  MD  calcium-vitamin D (OSCAL WITH D) 250-125 MG-UNIT per tablet Take 1 tablet by mouth 2 (two) times daily.    Historical Provider, MD  Cholecalciferol (VITAMIN D) 2000 UNITS tablet Take 2,000 Units by mouth 2 (two) times daily.    Historical Provider, MD  citalopram (CELEXA) 20 MG tablet Take 20 mg by mouth daily after supper.  08/05/11   Historical Provider, MD  cyclobenzaprine (FLEXERIL) 10 MG tablet Take 10 mg by mouth 3 (three) times daily as needed for muscle spasms.  12/16/11   Historical Provider, MD  fish oil-omega-3 fatty acids 1000 MG capsule Take 1 g by mouth daily.     Historical Provider, MD  hydrochlorothiazide (HYDRODIURIL) 25 MG tablet Take 25 mg by mouth daily.    Historical Provider, MD  HYDROcodone-acetaminophen (NORCO/VICODIN) 5-325 MG per tablet Take 1 tablet by mouth every 6 (six) hours as needed (pain).  04/08/13   Historical Provider, MD  Multiple Vitamins-Minerals (CENTRUM SILVER PO) Take 1 tablet by mouth every morning.     Historical Provider, MD  omeprazole (PRILOSEC) 20 MG capsule Take 20 mg by mouth every morning.  08/05/11   Historical Provider, MD  polycarbophil (FIBERCON) 625 MG tablet Take 625 mg by mouth 2 (two) times daily.    Historical Provider, MD  pramipexole (MIRAPEX) 0.5 MG tablet Take 0.5 mg by  mouth every evening.    Historical Provider, MD  propranolol (INDERAL) 10 MG tablet Take 10 mg by mouth 2 (two) times daily.  07/23/11   Historical Provider, MD  rivaroxaban (XARELTO) 20 MG TABS tablet Take 1 tablet (20 mg total) by mouth daily with supper. 01/03/14   Volanda Napoleon, MD  temazepam (RESTORIL) 30 MG capsule Take 30 mg by mouth at bedtime.  07/24/11   Historical Provider, MD  trimethoprim-polymyxin b (POLYTRIM) ophthalmic solution Place 1 drop into both eyes every 8 (eight) hours.  10/08/13   Historical Provider, MD  Turmeric Curcumin 500 MG CAPS Take 500 mg by mouth at bedtime.     Historical Provider, MD  vitamin E 400 UNIT capsule Take 400 Units by mouth  daily.    Historical Provider, MD   There were no vitals taken for this visit. Physical Exam  Constitutional: She is oriented to person, place, and time. She appears well-developed and well-nourished. No distress.  HENT:  Nose: Nose normal.  Mouth/Throat: Oropharynx is clear and moist. No oropharyngeal exudate.  3 cm circumferential swelling to posterior scalp on the left side. No open laceration, no bleeding.  Eyes: Conjunctivae and EOM are normal. Pupils are equal, round, and reactive to light. No scleral icterus.  Neck: Normal range of motion. Neck supple. No JVD present. No tracheal deviation present. No thyromegaly present.  Cardiovascular: Normal rate, regular rhythm and normal heart sounds.  Exam reveals no gallop and no friction rub.   No murmur heard. Pulmonary/Chest: Effort normal and breath sounds normal. No respiratory distress. She has no wheezes. She exhibits no tenderness.  Abdominal: Soft. Bowel sounds are normal. She exhibits no distension and no mass. There is no tenderness. There is no rebound and no guarding.  Musculoskeletal: Normal range of motion. She exhibits no edema or tenderness.  Lymphadenopathy:    She has no cervical adenopathy.  Neurological: She is alert and oriented to person, place, and time. No cranial nerve deficit. She exhibits normal muscle tone.  Normal strength and sensation 4 extremities, normal cerebellar testing.  Skin: Skin is warm and dry. No rash noted. She is not diaphoretic. No erythema. No pallor.  Nursing note and vitals reviewed.   ED Course  Procedures (including critical care time) Labs Review Labs Reviewed - No data to display  Imaging Review Ct Head Wo Contrast  04/20/2014   CLINICAL DATA:  Status post fall in kitchen. Hit head, with posterior scalp hematoma. No loss of consciousness. Initial encounter.  EXAM: CT HEAD WITHOUT CONTRAST  TECHNIQUE: Contiguous axial images were obtained from the base of the skull through the vertex  without intravenous contrast.  COMPARISON:  CT of the head performed 03/24/2014  FINDINGS: There is no evidence of acute infarction, mass lesion, or intra- or extra-axial hemorrhage on CT.  Scattered periventricular and subcortical white matter change likely reflects small vessel ischemic microangiopathy. Mild cerebellar atrophy is noted. Chronic lacunar infarcts are seen in the cerebellar hemispheres bilaterally.  The brainstem and fourth ventricle are within normal limits. The basal ganglia are unremarkable in appearance. The cerebral hemispheres demonstrate grossly normal gray-white differentiation. No mass effect or midline shift is seen.  There is no evidence of fracture; there appears to be chronic expansion and central erosion of the clivus, with a soft tissue lesion measuring approximately 2.3 cm. This has increased slightly in size since 2012, with suggestion of slight cortical breakthrough. The orbits are within normal limits. The paranasal sinuses and mastoid air cells  are well-aerated. Soft tissue swelling is noted posteriorly at the left vertex.  IMPRESSION: 1. No evidence of traumatic intracranial injury or fracture. 2. Soft tissue swelling noted posteriorly at the left vertex. 3. Scattered small vessel ischemic microangiopathy and mild cerebellar atrophy. Chronic lacunar infarcts within the cerebellar hemispheres bilaterally. 4. Apparent chronic expansion and central erosion of the clivus, with a soft tissue mass measuring 2.3 cm. This has increased slightly in size since 2012, with suggestion of slight cortical breakthrough. MRI would be helpful for further evaluation, when and as deemed clinically appropriate.   Electronically Signed   By: Garald Balding M.D.   On: 04/20/2014 05:47     EKG Interpretation None      MDM   Final diagnoses:  Fall    Patient presents emergency department out of concern for a fall. She is on Xarelto an increased risk for head bleed. Will obtain CT scan of  the head and give Tylenol for pain control.  CT scan of her head is neg for significant injury.  Patient was strongly advised to follow-up with her primary care physician regarding continuing her Xarelto medication. She has had multiple falls over the past couple of weeks, which is dangerous in the setting of this medicine. Patient was educated that she needs to have all of her medications reviewed as soon as possible. Her vital signs remain within her normal limits and she is safe for discharge.    Everlene Balls, MD 04/20/14 [phone]

## 2014-05-11 ENCOUNTER — Encounter: Payer: Self-pay | Admitting: Hematology & Oncology

## 2014-05-11 ENCOUNTER — Ambulatory Visit (HOSPITAL_BASED_OUTPATIENT_CLINIC_OR_DEPARTMENT_OTHER): Payer: PPO | Admitting: Hematology & Oncology

## 2014-05-11 ENCOUNTER — Other Ambulatory Visit (HOSPITAL_BASED_OUTPATIENT_CLINIC_OR_DEPARTMENT_OTHER): Payer: PPO | Admitting: Lab

## 2014-05-11 VITALS — BP 137/74 | HR 56 | Temp 98.2°F | Resp 14 | Ht 63.0 in | Wt 181.0 lb

## 2014-05-11 DIAGNOSIS — I82401 Acute embolism and thrombosis of unspecified deep veins of right lower extremity: Secondary | ICD-10-CM

## 2014-05-11 LAB — COMPREHENSIVE METABOLIC PANEL
ALT: 17 U/L (ref 0–35)
AST: 25 U/L (ref 0–37)
Albumin: 3.7 g/dL (ref 3.5–5.2)
Alkaline Phosphatase: 92 U/L (ref 39–117)
BUN: 24 mg/dL — ABNORMAL HIGH (ref 6–23)
CALCIUM: 9.6 mg/dL (ref 8.4–10.5)
CO2: 31 mEq/L (ref 19–32)
Chloride: 98 mEq/L (ref 96–112)
Creatinine, Ser: 0.96 mg/dL (ref 0.50–1.10)
GLUCOSE: 91 mg/dL (ref 70–99)
Potassium: 3.7 mEq/L (ref 3.5–5.3)
Sodium: 137 mEq/L (ref 135–145)
TOTAL PROTEIN: 6.9 g/dL (ref 6.0–8.3)
Total Bilirubin: 0.5 mg/dL (ref 0.2–1.2)

## 2014-05-11 LAB — CBC WITH DIFFERENTIAL (CANCER CENTER ONLY)
BASO#: 0 10*3/uL (ref 0.0–0.2)
BASO%: 0.6 % (ref 0.0–2.0)
EOS ABS: 0.2 10*3/uL (ref 0.0–0.5)
EOS%: 4.2 % (ref 0.0–7.0)
HEMATOCRIT: 37.4 % (ref 34.8–46.6)
HEMOGLOBIN: 12.2 g/dL (ref 11.6–15.9)
LYMPH#: 1.6 10*3/uL (ref 0.9–3.3)
LYMPH%: 31.3 % (ref 14.0–48.0)
MCH: 27.8 pg (ref 26.0–34.0)
MCHC: 32.6 g/dL (ref 32.0–36.0)
MCV: 85 fL (ref 81–101)
MONO#: 0.6 10*3/uL (ref 0.1–0.9)
MONO%: 11.3 % (ref 0.0–13.0)
NEUT%: 52.6 % (ref 39.6–80.0)
NEUTROS ABS: 2.7 10*3/uL (ref 1.5–6.5)
PLATELETS: 188 10*3/uL (ref 145–400)
RBC: 4.39 10*6/uL (ref 3.70–5.32)
RDW: 14.2 % (ref 11.1–15.7)
WBC: 5.1 10*3/uL (ref 3.9–10.0)

## 2014-05-11 LAB — FERRITIN CHCC: FERRITIN: 24 ng/mL (ref 9–269)

## 2014-05-11 LAB — IRON AND TIBC CHCC
%SAT: 23 % (ref 21–57)
IRON: 65 ug/dL (ref 41–142)
TIBC: 284 ug/dL (ref 236–444)
UIBC: 219 ug/dL (ref 120–384)

## 2014-05-11 LAB — RETICULOCYTES (CHCC)
ABS RETIC: 39.7 10*3/uL (ref 19.0–186.0)
RBC.: 4.41 MIL/uL (ref 3.87–5.11)
Retic Ct Pct: 0.9 % (ref 0.4–2.3)

## 2014-05-12 NOTE — Progress Notes (Signed)
Hematology and Oncology Follow Up Visit  [name] [id number] 06-07-1940 74 y.o. 05/12/2014   Principle Diagnosis:   DVT of the right leg  Prothrombin II gene mutation  Hemachromatosis (homozygous for C282Y  Mutation)  Past history of right lower extremity DVT/PE  Current Therapy:   Xarelto 20 milligrams by mouth daily -  to discontinue and start aspirin 162 mg by mouth daily   Interim History:  [name] is back for followup. She, unfortunately, has been having issues with passing out. She thinks is from medications. She thinks it might be from the Mirapex that she takes. She is on quite a few medications. I reassured her that it was not from the [place].  She's probably passed out a couple times already. She goes to the emergency room. She has scans done.  Given this issue with her passing out, I really think that it would be in her best interest to be off Xarelto. She's been on Xarelto for approximately 6 months. I wanted her on any coagulation for 1 year but I don't think we will be able to do this given this problem with passing out.  I talked her about this. I told her we will get her on aspirin at 162 mg by mouth daily. I told her to take with food.  I told her she really needs to see her family doctor to get this passing out evaluated. She is yet to do this.  The hemachromatosis is not been a problem.. When we last saw her in October, her ferritin was 25. Her last [place] done back in October showed a persistent thrombus in the right femoral vein. This appeared to be improved.    Medications:  Current outpatient prescriptions:  .  amLODipine (NORVASC) 5 MG tablet, Take 5 mg by mouth every morning. , Disp: , Rfl:  .  Biotin 5000 MCG TABS, Take 5,000 mcg by mouth every morning. , Disp: , Rfl:  .  calcium-vitamin D (OSCAL WITH D) 250-125 MG-UNIT per tablet, Take 1 tablet by mouth 2 (two) times daily., Disp: , Rfl:  .  Cholecalciferol (VITAMIN D) 2000 UNITS tablet,  Take 2,000 Units by mouth 2 (two) times daily., Disp: , Rfl:  .  citalopram (CELEXA) 20 MG tablet, Take 20 mg by mouth daily after supper. , Disp: , Rfl:  .  fish oil-omega-3 fatty acids 1000 MG capsule, Take 1 g by mouth daily. , Disp: , Rfl:  .  hydrochlorothiazide (HYDRODIURIL) 25 MG tablet, Take 25 mg by mouth daily., Disp: , Rfl:  .  Multiple Vitamins-Minerals (CENTRUM SILVER PO), Take 1 tablet by mouth every morning. , Disp: , Rfl:  .  omeprazole (PRILOSEC) 20 MG capsule, Take 20 mg by mouth every morning. , Disp: , Rfl:  .  polycarbophil (FIBERCON) 625 MG tablet, Take 625 mg by mouth 2 (two) times daily., Disp: , Rfl:  .  pramipexole (MIRAPEX) 0.5 MG tablet, Take 0.5 mg by mouth every evening., Disp: , Rfl:  .  propranolol (INDERAL) 10 MG tablet, Take 10 mg by mouth 2 (two) times daily. , Disp: , Rfl:  .  temazepam (RESTORIL) 30 MG capsule, Take 30 mg by mouth at bedtime. , Disp: , Rfl:  .  trimethoprim-polymyxin b (POLYTRIM) ophthalmic solution, Place 1 drop into both eyes every 8 (eight) hours. , Disp: , Rfl:  .  Turmeric Curcumin 500 MG CAPS, Take 500 mg by mouth at bedtime. , Disp: , Rfl:  .  vitamin  E 400 UNIT capsule, Take 400 Units by mouth daily., Disp: , Rfl:  .  cyclobenzaprine (FLEXERIL) 10 MG tablet, Take 10 mg by mouth 3 (three) times daily as needed for muscle spasms. , Disp: , Rfl:  .  HYDROcodone-acetaminophen (NORCO/VICODIN) 5-325 MG per tablet, Take 1 tablet by mouth every 6 (six) hours as needed (pain). , Disp: , Rfl:   Allergies:  Allergies  Allergen Reactions  . Penicillins Anaphylaxis  . Morphine And Related Other (See Comments)    twitching  . Sulfa Antibiotics Other (See Comments)    unknown    Past Medical History, Surgical history, Social history, and Family History were reviewed and updated.  Review of Systems: As above  Physical Exam:  height is 5\' 3"  (1.6 m) and weight is 181 lb (82.101 kg). Her oral temperature is 98.2 F (36.8 C). Her blood  pressure is 137/74 and her pulse is 56. Her respiration is 14.   Well-developed and well-nourished white female. Head and neck exam shows no ocular or oral lesions. There are no palpable cervical or supraclavicular lymph nodes. Lungs are clear. Cardiac exam regular rate and rhythm with no murmurs, rubs or bruits.. Abdomen is soft. She has good bowel sounds. There is no fluid wave. There is no palpable liver or spleen tip. Extremities shows no clubbing, cyanosis or edema. No venous cord is noted in the right leg. She has no swelling in the right leg. She has good range of motion of her joints. Skin exam shows no rashes, ecchymosis or petechia. Neurological exam shows no focal neurological deficits.  Lab Results  Component Value Date   WBC 5.1 05/11/2014   HGB 12.2 05/11/2014   HCT 37.4 05/11/2014   MCV 85 05/11/2014   PLT 188 05/11/2014     Chemistry      Component Value Date/Time   NA 137 05/11/2014 1006   NA 139 02/09/2014 1016   K 3.7 05/11/2014 1006   K 3.6 02/09/2014 1016   CL 98 05/11/2014 1006   CL 98 02/09/2014 1016   CO2 31 05/11/2014 1006   CO2 29 02/09/2014 1016   BUN 24* 05/11/2014 1006   BUN 17 02/09/2014 1016   CREATININE 0.96 05/11/2014 1006   CREATININE 0.9 02/09/2014 1016      Component Value Date/Time   CALCIUM 9.6 05/11/2014 1006   CALCIUM 9.4 02/09/2014 1016   ALKPHOS 92 05/11/2014 1006   ALKPHOS 82 02/09/2014 1016   AST 25 05/11/2014 1006   AST 27 02/09/2014 1016   ALT 17 05/11/2014 1006   ALT 23 02/09/2014 1016   BILITOT 0.5 05/11/2014 1006   BILITOT 0.70 02/09/2014 1016         Impression and Plan: [name] is 74 year old white female with a DVT in the right leg. This happened after she fell  Again, we will get her off the Xarelto for now. She has been on Xarelto for 6 months.  I want to repeat a Doppler on her when we see her back.  This was very complicated. I didn't expect all the issues with her passing out. I spent over 30 minutes with  her trying to figure out how we can help with this. I'm not coined to change her other medications. I will let this be done at the discretion of her family doctor.   Her iron studies today showed a ferritin of 24. As such, she does not need to be phlebotomized with the hemachromatosis.  I will see  her back probably in about 6 weeks.   Volanda Napoleon, MD 1/15/20167:45 AM

## 2014-06-22 ENCOUNTER — Other Ambulatory Visit (HOSPITAL_BASED_OUTPATIENT_CLINIC_OR_DEPARTMENT_OTHER): Payer: PPO | Admitting: Lab

## 2014-06-22 ENCOUNTER — Ambulatory Visit (HOSPITAL_BASED_OUTPATIENT_CLINIC_OR_DEPARTMENT_OTHER): Payer: PPO | Admitting: Hematology & Oncology

## 2014-06-22 ENCOUNTER — Ambulatory Visit (HOSPITAL_BASED_OUTPATIENT_CLINIC_OR_DEPARTMENT_OTHER)
Admission: RE | Admit: 2014-06-22 | Discharge: 2014-06-22 | Disposition: A | Discharge status: Home / Self Care | Payer: PPO | Source: Ambulatory Visit | Attending: Hematology & Oncology | Admitting: Hematology & Oncology

## 2014-06-22 DIAGNOSIS — I82401 Acute embolism and thrombosis of unspecified deep veins of right lower extremity: Secondary | ICD-10-CM

## 2014-06-22 DIAGNOSIS — D6852 Prothrombin gene mutation: Secondary | ICD-10-CM

## 2014-06-22 DIAGNOSIS — G4701 Insomnia due to medical condition: Secondary | ICD-10-CM

## 2014-06-22 DIAGNOSIS — I82411 Acute embolism and thrombosis of right femoral vein: Secondary | ICD-10-CM | POA: Insufficient documentation

## 2014-06-22 DIAGNOSIS — Z86718 Personal history of other venous thrombosis and embolism: Secondary | ICD-10-CM | POA: Diagnosis present

## 2014-06-22 LAB — CMP (CANCER CENTER ONLY)
ALBUMIN: 3.9 g/dL (ref 3.3–5.5)
ALK PHOS: 89 U/L — AB (ref 26–84)
ALT(SGPT): 21 U/L (ref 10–47)
AST: 28 U/L (ref 11–38)
BILIRUBIN TOTAL: 0.8 mg/dL (ref 0.20–1.60)
BUN, Bld: 23 mg/dL — ABNORMAL HIGH (ref 7–22)
CO2: 31 mEq/L (ref 18–33)
Calcium: 9.9 mg/dL (ref 8.0–10.3)
Chloride: 96 mEq/L — ABNORMAL LOW (ref 98–108)
Creat: 1 mg/dl (ref 0.6–1.2)
GLUCOSE: 83 mg/dL (ref 73–118)
POTASSIUM: 3.3 meq/L (ref 3.3–4.7)
Sodium: 142 mEq/L (ref 128–145)
Total Protein: 7.6 g/dL (ref 6.4–8.1)

## 2014-06-22 LAB — CBC WITH DIFFERENTIAL (CANCER CENTER ONLY)
BASO#: 0 10*3/uL (ref 0.0–0.2)
BASO%: 0.4 % (ref 0.0–2.0)
EOS%: 5 % (ref 0.0–7.0)
Eosinophils Absolute: 0.2 10*3/uL (ref 0.0–0.5)
HCT: 38.7 % (ref 34.8–46.6)
HGB: 12.6 g/dL (ref 11.6–15.9)
LYMPH#: 1.7 10*3/uL (ref 0.9–3.3)
LYMPH%: 37.7 % (ref 14.0–48.0)
MCH: 27.6 pg (ref 26.0–34.0)
MCHC: 32.6 g/dL (ref 32.0–36.0)
MCV: 85 fL (ref 81–101)
MONO#: 0.5 10*3/uL (ref 0.1–0.9)
MONO%: 10.8 % (ref 0.0–13.0)
NEUT%: 46.1 % (ref 39.6–80.0)
NEUTROS ABS: 2.1 10*3/uL (ref 1.5–6.5)
Platelets: 194 10*3/uL (ref 145–400)
RBC: 4.57 10*6/uL (ref 3.70–5.32)
RDW: 14.6 % (ref 11.1–15.7)
WBC: 4.6 10*3/uL (ref 3.9–10.0)

## 2014-06-22 MED ORDER — RIVAROXABAN 20 MG PO TABS: 20.0000 mg | ORAL_TABLET | Freq: Every day | ORAL | Status: DC

## 2014-06-22 MED ORDER — TEMAZEPAM 15 MG PO CAPS: 30.0000 mg | ORAL_CAPSULE | Freq: Every day | ORAL | Status: DC

## 2014-06-22 NOTE — Progress Notes (Signed)
Hematology and Oncology Follow Up Visit  [name] [id number] [birth year] 74 y.o. 06/22/2014   Principle Diagnosis:   DVT of the right leg  Prothrombin II gene mutation  Hemachromatosis (homozygous for C282Y  Mutation)  Past history of right lower extremity DVT/PE  Current Therapy:   Aspirin 162 mg by mouth daily-to stop and to start Xarelto 20 mg by mouth daily Interim History:  [name] is back for followup. She, is doing better area and she had some dosage adjustment with her medication so she has not fallen asleep as much as. She feels more energized.  We did go ahead and repeat a Doppler of her right leg. She has a chronic thrombus in the femoral vein.  She is on aspirin. I think that with her doing better and not falling and passing out, we should get her back onto Xarelto. I told her to stop the aspirin.  Her hemochromatosis has not been a problem for her. We have not had a phlebotomize her for a couple years at least.  Her appetite has been doing pretty good. She's had no nausea vomiting.  There's been no bleeding. There's been no change in bowel or bladder habits.      Medications:  Current outpatient prescriptions:  .  amLODipine (NORVASC) 5 MG tablet, Take 5 mg by mouth every morning. , Disp: , Rfl:  .  Biotin 5000 MCG TABS, Take 5,000 mcg by mouth every morning. , Disp: , Rfl:  .  calcium-vitamin D (OSCAL WITH D) 250-125 MG-UNIT per tablet, Take 1 tablet by mouth 2 (two) times daily., Disp: , Rfl:  .  Cholecalciferol (VITAMIN D) 2000 UNITS tablet, Take 2,000 Units by mouth 2 (two) times daily., Disp: , Rfl:  .  citalopram (CELEXA) 20 MG tablet, Take 20 mg by mouth daily after supper. , Disp: , Rfl:  .  fish oil-omega-3 fatty acids 1000 MG capsule, Take 1 g by mouth daily. , Disp: , Rfl:  .  hydrochlorothiazide (HYDRODIURIL) 25 MG tablet, Take 25 mg by mouth daily., Disp: , Rfl:  .  Multiple Vitamins-Minerals (CENTRUM SILVER PO), Take 1 tablet by mouth  every morning. , Disp: , Rfl:  .  omeprazole (PRILOSEC) 20 MG capsule, Take 20 mg by mouth every morning. , Disp: , Rfl:  .  polycarbophil (FIBERCON) 625 MG tablet, Take 625 mg by mouth 2 (two) times daily., Disp: , Rfl:  .  pramipexole (MIRAPEX) 0.5 MG tablet, Take 0.5 mg by mouth every evening., Disp: , Rfl:  .  propranolol (INDERAL) 10 MG tablet, Take 10 mg by mouth 2 (two) times daily. , Disp: , Rfl:  .  rivaroxaban (XARELTO) 20 MG TABS tablet, Take 1 tablet (20 mg total) by mouth daily with supper., Disp: 30 tablet, Rfl: 6 .  temazepam (RESTORIL) 15 MG capsule, Take 2 capsules (30 mg total) by mouth at bedtime., Disp: 30 capsule, Rfl: 3 .  trimethoprim-polymyxin b (POLYTRIM) ophthalmic solution, Place 1 drop into both eyes every 8 (eight) hours. , Disp: , Rfl:  .  Turmeric Curcumin 500 MG CAPS, Take 500 mg by mouth at bedtime. , Disp: , Rfl:  .  vitamin E 400 UNIT capsule, Take 400 Units by mouth daily., Disp: , Rfl:   Allergies:  Allergies  Allergen Reactions  . Penicillins Anaphylaxis  . Morphine And Related Other (See Comments)    twitching  . Sulfa Antibiotics Other (See Comments)    unknown    Past Medical History,  Surgical history, Social history, and Family History were reviewed and updated.  Review of Systems: As above  Physical Exam:  vitals were not taken for this visit.  Well-developed and well-nourished white female. Head and neck exam shows no ocular or oral lesions. There are no palpable cervical or supraclavicular lymph nodes. Lungs are clear. Cardiac exam regular rate and rhythm with no murmurs, rubs or bruits.. Abdomen is soft. She has good bowel sounds. There is no fluid wave. There is no palpable liver or spleen tip. Extremities shows no clubbing, cyanosis or edema. No venous cord is noted in the right leg. She has no swelling in the right leg. She has good range of motion of her joints. Skin exam shows no rashes, ecchymosis or petechia. Neurological exam shows no  focal neurological deficits.  Lab Results  Component Value Date   WBC 4.6 06/22/2014   HGB 12.6 06/22/2014   HCT 38.7 06/22/2014   MCV 85 06/22/2014   PLT 194 06/22/2014     Chemistry      Component Value Date/Time   NA 142 06/22/2014 1436   NA 137 05/11/2014 1006   K 3.3 06/22/2014 1436   K 3.7 05/11/2014 1006   CL 96* 06/22/2014 1436   CL 98 05/11/2014 1006   CO2 31 06/22/2014 1436   CO2 31 05/11/2014 1006   BUN 23* 06/22/2014 1436   BUN 24* 05/11/2014 1006   CREATININE 1.0 06/22/2014 1436   CREATININE 0.96 05/11/2014 1006      Component Value Date/Time   CALCIUM 9.9 06/22/2014 1436   CALCIUM 9.6 05/11/2014 1006   ALKPHOS 89* 06/22/2014 1436   ALKPHOS 92 05/11/2014 1006   AST 28 06/22/2014 1436   AST 25 05/11/2014 1006   ALT 21 06/22/2014 1436   ALT 17 05/11/2014 1006   BILITOT 0.80 06/22/2014 1436   BILITOT 0.5 05/11/2014 1006         Impression and Plan: [name] is 74 year old white female with a DVT in the right leg. This happened after she fell  She has the persistent thrombus in the right leg. Since she is doing better with respect to fall asleep, I think we have to get her back onto Xarelto. I think with the thrombus in her thigh, this has a significant risk of propagation to a pulmonary embolism. I will call in the Xarelto for her. She will stop the aspirin.  I'm glad that she is feeling better. I'm glad that there was some adjustment with her medications so she would not fall asleep a lot.  Her iron studies today showed a ferritin of 24. As such, she does not need to be phlebotomized with the hemachromatosis.  I will see her back probably in about 3 weeks.   Volanda Napoleon, MD 2/25/20165:44 PM

## 2014-06-23 ENCOUNTER — Telehealth: Payer: Self-pay | Admitting: *Deleted

## 2014-06-23 LAB — IRON AND TIBC CHCC
%SAT: 28 % (ref 21–57)
IRON: 82 ug/dL (ref 41–142)
TIBC: 293 ug/dL (ref 236–444)
UIBC: 211 ug/dL (ref 120–384)

## 2014-06-23 LAB — FERRITIN CHCC: FERRITIN: 21 ng/mL (ref 9–269)

## 2014-06-23 LAB — D-DIMER, QUANTITATIVE (NOT AT ARMC): D-Dimer, Quant: 2.88 ug/mL-FEU — ABNORMAL HIGH (ref 0.00–0.48)

## 2014-06-23 NOTE — Telephone Encounter (Signed)
No phlebotomy needed per dr. Marin Olp.  Ferritin is ok!

## 2014-06-23 NOTE — Telephone Encounter (Signed)
Dr Marin Olp wanted to call to ensure that patient stopped taking her aspirin. Patient confirmed that she is not taking aspirin now that she is on xarelto.

## 2014-08-16 ENCOUNTER — Ambulatory Visit: Payer: Medicare Other | Admitting: Hematology & Oncology

## 2014-08-16 ENCOUNTER — Other Ambulatory Visit: Payer: Medicare Other | Admitting: Lab

## 2014-08-17 ENCOUNTER — Ambulatory Visit (HOSPITAL_BASED_OUTPATIENT_CLINIC_OR_DEPARTMENT_OTHER): Payer: PPO | Admitting: Hematology & Oncology

## 2014-08-17 ENCOUNTER — Other Ambulatory Visit (HOSPITAL_BASED_OUTPATIENT_CLINIC_OR_DEPARTMENT_OTHER): Payer: PPO

## 2014-08-17 ENCOUNTER — Encounter: Payer: Self-pay | Admitting: Hematology & Oncology

## 2014-08-17 VITALS — BP 154/66 | HR 50 | Temp 97.8°F | Resp 14 | Ht 63.0 in | Wt 172.0 lb

## 2014-08-17 DIAGNOSIS — D6852 Prothrombin gene mutation: Secondary | ICD-10-CM

## 2014-08-17 DIAGNOSIS — G4701 Insomnia due to medical condition: Secondary | ICD-10-CM

## 2014-08-17 DIAGNOSIS — I82401 Acute embolism and thrombosis of unspecified deep veins of right lower extremity: Secondary | ICD-10-CM

## 2014-08-17 LAB — CBC WITH DIFFERENTIAL (CANCER CENTER ONLY)
BASO#: 0 10*3/uL (ref 0.0–0.2)
BASO%: 0.5 % (ref 0.0–2.0)
EOS ABS: 0.2 10*3/uL (ref 0.0–0.5)
EOS%: 3.3 % (ref 0.0–7.0)
HCT: 37.3 % (ref 34.8–46.6)
HEMOGLOBIN: 12.4 g/dL (ref 11.6–15.9)
LYMPH#: 1.5 10*3/uL (ref 0.9–3.3)
LYMPH%: 22.7 % (ref 14.0–48.0)
MCH: 28.1 pg (ref 26.0–34.0)
MCHC: 33.2 g/dL (ref 32.0–36.0)
MCV: 84 fL (ref 81–101)
MONO#: 0.6 10*3/uL (ref 0.1–0.9)
MONO%: 9.4 % (ref 0.0–13.0)
NEUT#: 4.2 10*3/uL (ref 1.5–6.5)
NEUT%: 64.1 % (ref 39.6–80.0)
Platelets: 179 10*3/uL (ref 145–400)
RBC: 4.42 10*6/uL (ref 3.70–5.32)
RDW: 14.5 % (ref 11.1–15.7)
WBC: 6.6 10*3/uL (ref 3.9–10.0)

## 2014-08-17 LAB — COMPREHENSIVE METABOLIC PANEL
ALT: 18 U/L (ref 0–35)
AST: 26 U/L (ref 0–37)
Albumin: 3.8 g/dL (ref 3.5–5.2)
Alkaline Phosphatase: 79 U/L (ref 39–117)
BUN: 24 mg/dL — ABNORMAL HIGH (ref 6–23)
CALCIUM: 9.4 mg/dL (ref 8.4–10.5)
CO2: 29 meq/L (ref 19–32)
Chloride: 96 mEq/L (ref 96–112)
Creatinine, Ser: 0.97 mg/dL (ref 0.50–1.10)
Glucose, Bld: 85 mg/dL (ref 70–99)
Potassium: 3.3 mEq/L — ABNORMAL LOW (ref 3.5–5.3)
SODIUM: 137 meq/L (ref 135–145)
TOTAL PROTEIN: 6.9 g/dL (ref 6.0–8.3)
Total Bilirubin: 0.6 mg/dL (ref 0.2–1.2)

## 2014-08-17 LAB — D-DIMER, QUANTITATIVE: D-Dimer, Quant: 2.9 ug/mL-FEU — ABNORMAL HIGH (ref 0.00–0.48)

## 2014-08-17 NOTE — Progress Notes (Signed)
Hematology and Oncology Follow Up Visit  [name] [id number] [birth year] 74 y.o. 08/17/2014   Principle Diagnosis:   DVT of the right leg  Prothrombin II gene mutation  Hemachromatosis (homozygous for C282Y  Mutation)  Past history of right lower extremity DVT/PE  Current Therapy:   Xarelto 20 mg by mouth daily Interim History:  [name] is back for followup. She, is doing better area and she had some dosage adjustment with her medication so she has not fallen asleep as much as. She feels more energized.  She is signs of problems with her right leg. She has occasional pain. There's been occasional swelling. She's had no redness. There's been no issues with her feet.  She's had no bleeding. She's had no cough. She's had no issues with nausea or vomiting. Her appetite has been good.  Her last iron studies back in February showed her ferritin 21 with an iron saturation 28%.    Medications:  Current outpatient prescriptions:  .  amLODipine (NORVASC) 5 MG tablet, Take 5 mg by mouth every morning. , Disp: , Rfl:  .  Biotin 5000 MCG TABS, Take 5,000 mcg by mouth every morning. , Disp: , Rfl:  .  calcium-vitamin D (OSCAL WITH D) 250-125 MG-UNIT per tablet, Take 1 tablet by mouth 2 (two) times daily., Disp: , Rfl:  .  Cholecalciferol (VITAMIN D) 2000 UNITS tablet, Take 2,000 Units by mouth 2 (two) times daily., Disp: , Rfl:  .  citalopram (CELEXA) 20 MG tablet, Take 20 mg by mouth daily after supper. , Disp: , Rfl:  .  fish oil-omega-3 fatty acids 1000 MG capsule, Take 1 g by mouth daily. , Disp: , Rfl:  .  hydrochlorothiazide (HYDRODIURIL) 25 MG tablet, Take 25 mg by mouth daily., Disp: , Rfl:  .  Multiple Vitamins-Minerals (CENTRUM SILVER PO), Take 1 tablet by mouth every morning. , Disp: , Rfl:  .  omeprazole (PRILOSEC) 20 MG capsule, Take 20 mg by mouth every morning. , Disp: , Rfl:  .  polycarbophil (FIBERCON) 625 MG tablet, Take 625 mg by mouth 2 (two) times daily.,  Disp: , Rfl:  .  pramipexole (MIRAPEX) 0.5 MG tablet, Take 0.5 mg by mouth every evening., Disp: , Rfl:  .  propranolol (INDERAL) 10 MG tablet, Take 10 mg by mouth 2 (two) times daily. , Disp: , Rfl:  .  rivaroxaban (XARELTO) 20 MG TABS tablet, Take 1 tablet (20 mg total) by mouth daily with supper., Disp: 30 tablet, Rfl: 6 .  temazepam (RESTORIL) 15 MG capsule, Take 2 capsules (30 mg total) by mouth at bedtime., Disp: 30 capsule, Rfl: 3 .  trimethoprim-polymyxin b (POLYTRIM) ophthalmic solution, Place 1 drop into both eyes every 8 (eight) hours. , Disp: , Rfl:  .  Turmeric Curcumin 500 MG CAPS, Take 500 mg by mouth at bedtime. , Disp: , Rfl:  .  vitamin E 400 UNIT capsule, Take 400 Units by mouth daily., Disp: , Rfl:   Allergies:  Allergies  Allergen Reactions  . Penicillins Anaphylaxis  . Morphine And Related Other (See Comments)    twitching  . Sulfa Antibiotics Other (See Comments)    unknown    Past Medical History, Surgical history, Social history, and Family History were reviewed and updated.  Review of Systems: As above  Physical Exam:  height is 5\' 3"  (1.6 m) and weight is 172 lb (78.019 kg). Her oral temperature is 97.8 F (36.6 C). Her blood pressure is 154/66 and  her pulse is 50. Her respiration is 14.   Well-developed and well-nourished white female. Head and neck exam shows no ocular or oral lesions. There are no palpable cervical or supraclavicular lymph nodes. Lungs are clear. Cardiac exam regular rate and rhythm with no murmurs, rubs or bruits.. Abdomen is soft. She has good bowel sounds. There is no fluid wave. There is no palpable liver or spleen tip. Extremities shows no clubbing, cyanosis or edema. No venous cord is noted in the right leg. She has no swelling in the right leg. She has good range of motion of her joints. Skin exam shows no rashes, ecchymosis or petechia. Neurological exam shows no focal neurological deficits.  Lab Results  Component Value Date    WBC 6.6 08/17/2014   HGB 12.4 08/17/2014   HCT 37.3 08/17/2014   MCV 84 08/17/2014   PLT 179 08/17/2014     Chemistry      Component Value Date/Time   NA 142 06/22/2014 1436   NA 137 05/11/2014 1006   K 3.3 06/22/2014 1436   K 3.7 05/11/2014 1006   CL 96* 06/22/2014 1436   CL 98 05/11/2014 1006   CO2 31 06/22/2014 1436   CO2 31 05/11/2014 1006   BUN 23* 06/22/2014 1436   BUN 24* 05/11/2014 1006   CREATININE 1.0 06/22/2014 1436   CREATININE 0.96 05/11/2014 1006      Component Value Date/Time   CALCIUM 9.9 06/22/2014 1436   CALCIUM 9.6 05/11/2014 1006   ALKPHOS 89* 06/22/2014 1436   ALKPHOS 92 05/11/2014 1006   AST 28 06/22/2014 1436   AST 25 05/11/2014 1006   ALT 21 06/22/2014 1436   ALT 17 05/11/2014 1006   BILITOT 0.80 06/22/2014 1436   BILITOT 0.5 05/11/2014 1006         Impression and Plan: [name] is 75 year old white female with a DVT in the right leg. This happened after she fell  She has the persistent thrombus in the right leg. I will keep her on Xarelto for right now. I want to repeat a Doppler of her right leg moist see her back in 2 months.  Her hemachromatosis really has not been a problem.   I will see her back probably in about 2 months Volanda Napoleon, MD 4/21/201611:06 AM

## 2014-08-29 ENCOUNTER — Other Ambulatory Visit: Payer: Self-pay | Admitting: Family Medicine

## 2014-08-29 ENCOUNTER — Ambulatory Visit
Admission: RE | Admit: 2014-08-29 | Discharge: 2014-08-29 | Disposition: A | Discharge status: Home / Self Care | Payer: PPO | Source: Ambulatory Visit | Attending: Family Medicine | Admitting: Family Medicine

## 2014-08-29 DIAGNOSIS — M25572 Pain in left ankle and joints of left foot: Secondary | ICD-10-CM

## 2014-10-23 ENCOUNTER — Ambulatory Visit (HOSPITAL_BASED_OUTPATIENT_CLINIC_OR_DEPARTMENT_OTHER)
Admission: RE | Admit: 2014-10-23 | Discharge: 2014-10-23 | Disposition: A | Discharge status: Home / Self Care | Payer: PPO | Source: Ambulatory Visit | Attending: Hematology & Oncology | Admitting: Hematology & Oncology

## 2014-10-23 ENCOUNTER — Ambulatory Visit (HOSPITAL_BASED_OUTPATIENT_CLINIC_OR_DEPARTMENT_OTHER): Payer: PPO | Admitting: Family

## 2014-10-23 ENCOUNTER — Other Ambulatory Visit (HOSPITAL_BASED_OUTPATIENT_CLINIC_OR_DEPARTMENT_OTHER): Payer: PPO

## 2014-10-23 VITALS — BP 164/74 | HR 55 | Temp 97.6°F | Resp 20 | Wt 179.0 lb

## 2014-10-23 DIAGNOSIS — I82511 Chronic embolism and thrombosis of right femoral vein: Secondary | ICD-10-CM | POA: Insufficient documentation

## 2014-10-23 DIAGNOSIS — I82401 Acute embolism and thrombosis of unspecified deep veins of right lower extremity: Secondary | ICD-10-CM

## 2014-10-23 DIAGNOSIS — D6852 Prothrombin gene mutation: Secondary | ICD-10-CM

## 2014-10-23 LAB — CMP (CANCER CENTER ONLY)
ALK PHOS: 103 U/L — AB (ref 26–84)
ALT: 24 U/L (ref 10–47)
AST: 34 U/L (ref 11–38)
Albumin: 3.3 g/dL (ref 3.3–5.5)
BILIRUBIN TOTAL: 0.7 mg/dL (ref 0.20–1.60)
BUN, Bld: 16 mg/dL (ref 7–22)
CO2: 31 mEq/L (ref 18–33)
CREATININE: 0.7 mg/dL (ref 0.6–1.2)
Calcium: 9 mg/dL (ref 8.0–10.3)
Chloride: 96 mEq/L — ABNORMAL LOW (ref 98–108)
Glucose, Bld: 88 mg/dL (ref 73–118)
Potassium: 3.8 mEq/L (ref 3.3–4.7)
SODIUM: 138 meq/L (ref 128–145)
TOTAL PROTEIN: 6.8 g/dL (ref 6.4–8.1)

## 2014-10-23 LAB — CBC WITH DIFFERENTIAL (CANCER CENTER ONLY)
BASO#: 0 10*3/uL (ref 0.0–0.2)
BASO%: 0.7 % (ref 0.0–2.0)
EOS%: 5 % (ref 0.0–7.0)
Eosinophils Absolute: 0.2 10*3/uL (ref 0.0–0.5)
HCT: 35.2 % (ref 34.8–46.6)
HGB: 11.7 g/dL (ref 11.6–15.9)
LYMPH#: 1.5 10*3/uL (ref 0.9–3.3)
LYMPH%: 34.8 % (ref 14.0–48.0)
MCH: 28.8 pg (ref 26.0–34.0)
MCHC: 33.2 g/dL (ref 32.0–36.0)
MCV: 87 fL (ref 81–101)
MONO#: 0.5 10*3/uL (ref 0.1–0.9)
MONO%: 11.6 % (ref 0.0–13.0)
NEUT#: 2 10*3/uL (ref 1.5–6.5)
NEUT%: 47.9 % (ref 39.6–80.0)
Platelets: 163 10*3/uL (ref 145–400)
RBC: 4.06 10*6/uL (ref 3.70–5.32)
RDW: 15 % (ref 11.1–15.7)
WBC: 4.2 10*3/uL (ref 3.9–10.0)

## 2014-10-23 LAB — IRON AND TIBC CHCC
%SAT: 18 % — ABNORMAL LOW (ref 21–57)
Iron: 45 ug/dL (ref 41–142)
TIBC: 251 ug/dL (ref 236–444)
UIBC: 207 ug/dL (ref 120–384)

## 2014-10-23 LAB — FERRITIN CHCC: Ferritin: 20 ng/ml (ref 9–269)

## 2014-10-23 NOTE — Progress Notes (Signed)
Hematology and Oncology Follow Up Visit  [name] [id number] 1940-12-20 74 y.o. 10/23/2014   Principle Diagnosis:  DVT of the right leg Prothrombin II gene mutation Hemachromatosis (homozygous for C282Y Mutation) Past history of right lower extremity DVT/PE  Current Therapy:   Xarelto 20 mg by mouth daily    Interim History:  [name] is here today for a follow-up. She is doing well on Xarelto and has had no episodes of bleeding or bruising. She had an [place] today which showed a persistent chronic DVT in the femoral vein throughout the thigh. She will finish 1 full year of anticoagulation with xarelto in July.  She would like to come off Xarelto in July due to the cost. We will let her finish what she has left and then start 2 baby aspirin daily. We will do another doppler in October.  She has some SOB with exertion at time with the heat outside. No fever, chills, n/v, cough, rash, dizziness, SOB, chest pain, palpitations, abdominal pain, palpitations, abdominal pain, constipation, diarrhea, blood in urine or stool.  She has some occassionally pain and swelling in her right knee. She denies swelling or pain in her right leg. She has not been wearing compression stocking. No numbness or tingling in her extremities. No new aches or pains.  She is eating well and staying hydrated. Her weight is stable.  She just returned from spending a week with her family at the beach. They had a great time.   Medications:    Medication List       This list is accurate as of: 10/23/14  8:37 PM.  Always use your most recent med list.               amLODipine 5 MG tablet  Commonly known as:  NORVASC  Take 5 mg by mouth every morning.     Biotin 5000 MCG Tabs  Take 5,000 mcg by mouth every morning.     calcium-vitamin D 250-125 MG-UNIT per tablet  Commonly known as:  OSCAL WITH D  Take 1 tablet by mouth 2 (two) times daily.     CENTRUM SILVER PO  Take 1 tablet by mouth every morning.       citalopram 20 MG tablet  Commonly known as:  CELEXA  Take 20 mg by mouth daily after supper.     fish oil-omega-3 fatty acids 1000 MG capsule  Take 1 g by mouth daily.     hydrochlorothiazide 25 MG tablet  Commonly known as:  HYDRODIURIL  Take 25 mg by mouth daily.     omeprazole 20 MG capsule  Commonly known as:  PRILOSEC  Take 20 mg by mouth every morning.     polycarbophil 625 MG tablet  Commonly known as:  FIBERCON  Take 625 mg by mouth 2 (two) times daily.     pramipexole 0.5 MG tablet  Commonly known as:  MIRAPEX  Take 0.5 mg by mouth every evening.     propranolol 10 MG tablet  Commonly known as:  INDERAL  Take 10 mg by mouth 2 (two) times daily.     rivaroxaban 20 MG Tabs tablet  Commonly known as:  XARELTO  Take 1 tablet (20 mg total) by mouth daily with supper.     temazepam 15 MG capsule  Commonly known as:  RESTORIL  Take 2 capsules (30 mg total) by mouth at bedtime.     trimethoprim-polymyxin b ophthalmic solution  Commonly known as:  POLYTRIM  Place 1 drop into both eyes every 8 (eight) hours.     Turmeric Curcumin 500 MG Caps  Take 500 mg by mouth at bedtime.     Vitamin D 2000 UNITS tablet  Take 2,000 Units by mouth 2 (two) times daily.     vitamin E 400 UNIT capsule  Take 400 Units by mouth daily.        Allergies:  Allergies  Allergen Reactions  . Penicillins Anaphylaxis  . Morphine And Related Other (See Comments)    twitching  . Sulfa Antibiotics Other (See Comments)    unknown    Past Medical History, Surgical history, Social history, and Family History were reviewed and updated.  Review of Systems: All other 10 point review of systems is negative.   Physical Exam:  weight is 179 lb (81.194 kg). Her oral temperature is 97.6 F (36.4 C). Her blood pressure is 164/74 and her pulse is 55. Her respiration is 20.   Wt Readings from Last 3 Encounters:  10/23/14 179 lb (81.194 kg)  08/17/14 172 lb (78.019 kg)  05/11/14 181  lb (82.101 kg)    Ocular: Sclerae unicteric, pupils equal, round and reactive to light Ear-nose-throat: Oropharynx clear, dentition fair Lymphatic: No cervical or supraclavicular adenopathy Lungs no rales or rhonchi, good excursion bilaterally Heart regular rate and rhythm, no murmur appreciated Abd soft, nontender, positive bowel sounds MSK no focal spinal tenderness, no joint edema Neuro: non-focal, well-oriented, appropriate affect Breasts: Deferred  Lab Results  Component Value Date   WBC 4.2 10/23/2014   HGB 11.7 10/23/2014   HCT 35.2 10/23/2014   MCV 87 10/23/2014   PLT 163 10/23/2014   Lab Results  Component Value Date   FERRITIN 20 10/23/2014   IRON 45 10/23/2014   TIBC 251 10/23/2014   UIBC 207 10/23/2014   IRONPCTSAT 18* 10/23/2014   Lab Results  Component Value Date   RETICCTPCT 0.9 05/11/2014   RBC 4.06 10/23/2014   RETICCTABS 39.7 05/11/2014   No results found for: KPAFRELGTCHN, LAMBDASER, KAPLAMBRATIO No results found for: IGGSERUM, IGA, IGMSERUM No results found for: Odetta Pink, SPEI   Chemistry      Component Value Date/Time   NA 138 10/23/2014 1014   NA 137 08/17/2014 1007   K 3.8 10/23/2014 1014   K 3.3* 08/17/2014 1007   CL 96* 10/23/2014 1014   CL 96 08/17/2014 1007   CO2 31 10/23/2014 1014   CO2 29 08/17/2014 1007   BUN 16 10/23/2014 1014   BUN 24* 08/17/2014 1007   CREATININE 0.7 10/23/2014 1014   CREATININE 0.97 08/17/2014 1007      Component Value Date/Time   CALCIUM 9.0 10/23/2014 1014   CALCIUM 9.4 08/17/2014 1007   ALKPHOS 103* 10/23/2014 1014   ALKPHOS 79 08/17/2014 1007   AST 34 10/23/2014 1014   AST 26 08/17/2014 1007   ALT 24 10/23/2014 1014   ALT 18 08/17/2014 1007   BILITOT 0.70 10/23/2014 1014   BILITOT 0.6 08/17/2014 1007     Impression and Plan: [name] is 74 year old white female with a DVT in the right leg.  An [place] today showed a persistent chronic  thrombus.  She will finish Xarelto the second week of July and then start taking 2 baby aspirin daily. If she does develop another thrombus then we will have to look at putting her on life long anticoagulation. Her hemochromatosis has not been an issue. She has not been phlebotomized  in close to 10 years.  We will plan to see her back in 4 months for labs and follow-up.  She knows to contact us with any questions or concerns. We can certainly see her sooner if need be.  Eliezer Bottom, NP 6/27/20168:37 PM

## 2014-11-23 ENCOUNTER — Other Ambulatory Visit (HOSPITAL_COMMUNITY): Payer: Self-pay | Admitting: Respiratory Therapy

## 2014-11-23 DIAGNOSIS — R0602 Shortness of breath: Secondary | ICD-10-CM

## 2014-11-30 ENCOUNTER — Ambulatory Visit (HOSPITAL_COMMUNITY)
Admission: RE | Admit: 2014-11-30 | Discharge: 2014-11-30 | Disposition: A | Discharge status: Home / Self Care | Payer: PPO | Source: Ambulatory Visit | Attending: Family Medicine | Admitting: Family Medicine

## 2014-11-30 DIAGNOSIS — R0602 Shortness of breath: Secondary | ICD-10-CM | POA: Diagnosis present

## 2014-11-30 LAB — PULMONARY FUNCTION TEST
DL/VA % pred: 105 %
DL/VA: 4.79 ml/min/mmHg/L
DLCO UNC: 18.42 ml/min/mmHg
DLCO unc % pred: 85 %
FEF 25-75 POST: 1.3 L/s
FEF 25-75 Pre: 1.16 L/sec
FEF2575-%CHANGE-POST: 11 %
FEF2575-%Pred-Post: 83 %
FEF2575-%Pred-Pre: 74 %
FEV1-%Change-Post: 1 %
FEV1-%Pred-Post: 75 %
FEV1-%Pred-Pre: 74 %
FEV1-Post: 1.47 L
FEV1-Pre: 1.45 L
FEV1FVC-%Change-Post: 0 %
FEV1FVC-%PRED-PRE: 100 %
FEV6-%Change-Post: 2 %
FEV6-%PRED-PRE: 78 %
FEV6-%Pred-Post: 79 %
FEV6-PRE: 1.92 L
FEV6-Post: 1.96 L
FEV6FVC-%Pred-Post: 105 %
FEV6FVC-%Pred-Pre: 105 %
FVC-%CHANGE-POST: 2 %
FVC-%PRED-POST: 75 %
FVC-%PRED-PRE: 74 %
FVC-Post: 1.96 L
FVC-Pre: 1.92 L
POST FEV1/FVC RATIO: 75 %
POST FEV6/FVC RATIO: 100 %
PRE FEV6/FVC RATIO: 100 %
Pre FEV1/FVC ratio: 76 %
RV % PRED: 115 %
RV: 2.52 L
TLC % PRED: 94 %
TLC: 4.48 L

## 2014-11-30 MED ORDER — ALBUTEROL SULFATE (2.5 MG/3ML) 0.083% IN NEBU
2.5000 mg | INHALATION_SOLUTION | Freq: Once | RESPIRATORY_TRACT | Status: AC
  Administered 2014-11-30: 2.5 mg via RESPIRATORY_TRACT

## 2015-02-22 ENCOUNTER — Other Ambulatory Visit: Payer: PPO

## 2015-02-22 ENCOUNTER — Ambulatory Visit (HOSPITAL_BASED_OUTPATIENT_CLINIC_OR_DEPARTMENT_OTHER): Payer: PPO

## 2015-02-22 ENCOUNTER — Ambulatory Visit: Payer: PPO | Admitting: Hematology & Oncology

## 2015-03-12 ENCOUNTER — Ambulatory Visit (HOSPITAL_BASED_OUTPATIENT_CLINIC_OR_DEPARTMENT_OTHER): Payer: PPO | Admitting: Hematology & Oncology

## 2015-03-12 ENCOUNTER — Ambulatory Visit (HOSPITAL_BASED_OUTPATIENT_CLINIC_OR_DEPARTMENT_OTHER)
Admission: RE | Admit: 2015-03-12 | Discharge: 2015-03-12 | Disposition: A | Discharge status: Home / Self Care | Payer: PPO | Source: Ambulatory Visit | Attending: Family | Admitting: Family

## 2015-03-12 ENCOUNTER — Other Ambulatory Visit (HOSPITAL_BASED_OUTPATIENT_CLINIC_OR_DEPARTMENT_OTHER): Payer: PPO

## 2015-03-12 ENCOUNTER — Encounter: Payer: Self-pay | Admitting: Hematology & Oncology

## 2015-03-12 VITALS — BP 168/85 | HR 59 | Temp 97.9°F | Resp 18 | Ht 63.0 in | Wt 178.0 lb

## 2015-03-12 DIAGNOSIS — I82441 Acute embolism and thrombosis of right tibial vein: Secondary | ICD-10-CM

## 2015-03-12 DIAGNOSIS — D6852 Prothrombin gene mutation: Secondary | ICD-10-CM

## 2015-03-12 DIAGNOSIS — I82401 Acute embolism and thrombosis of unspecified deep veins of right lower extremity: Secondary | ICD-10-CM

## 2015-03-12 DIAGNOSIS — I82511 Chronic embolism and thrombosis of right femoral vein: Secondary | ICD-10-CM | POA: Diagnosis not present

## 2015-03-12 DIAGNOSIS — J200 Acute bronchitis due to Mycoplasma pneumoniae: Secondary | ICD-10-CM

## 2015-03-12 DIAGNOSIS — I82501 Chronic embolism and thrombosis of unspecified deep veins of right lower extremity: Secondary | ICD-10-CM

## 2015-03-12 LAB — CBC WITH DIFFERENTIAL (CANCER CENTER ONLY)
BASO#: 0 10*3/uL (ref 0.0–0.2)
BASO%: 0.3 % (ref 0.0–2.0)
EOS%: 4.4 % (ref 0.0–7.0)
Eosinophils Absolute: 0.3 10*3/uL (ref 0.0–0.5)
HEMATOCRIT: 38.7 % (ref 34.8–46.6)
HEMOGLOBIN: 12.5 g/dL (ref 11.6–15.9)
LYMPH#: 1.4 10*3/uL (ref 0.9–3.3)
LYMPH%: 21.1 % (ref 14.0–48.0)
MCH: 27.7 pg (ref 26.0–34.0)
MCHC: 32.3 g/dL (ref 32.0–36.0)
MCV: 86 fL (ref 81–101)
MONO#: 0.6 10*3/uL (ref 0.1–0.9)
MONO%: 8.5 % (ref 0.0–13.0)
NEUT%: 65.7 % (ref 39.6–80.0)
NEUTROS ABS: 4.5 10*3/uL (ref 1.5–6.5)
Platelets: 195 10*3/uL (ref 145–400)
RBC: 4.51 10*6/uL (ref 3.70–5.32)
RDW: 14 % (ref 11.1–15.7)
WBC: 6.8 10*3/uL (ref 3.9–10.0)

## 2015-03-12 LAB — COMPREHENSIVE METABOLIC PANEL (CC13)
ALT: 17 U/L (ref 0–55)
AST: 26 U/L (ref 5–34)
Albumin: 3.4 g/dL — ABNORMAL LOW (ref 3.5–5.0)
Alkaline Phosphatase: 103 U/L (ref 40–150)
Anion Gap: 9 mEq/L (ref 3–11)
BILIRUBIN TOTAL: 0.51 mg/dL (ref 0.20–1.20)
BUN: 15.3 mg/dL (ref 7.0–26.0)
CHLORIDE: 98 meq/L (ref 98–109)
CO2: 32 meq/L — AB (ref 22–29)
CREATININE: 0.9 mg/dL (ref 0.6–1.1)
Calcium: 9.5 mg/dL (ref 8.4–10.4)
EGFR: 60 mL/min/{1.73_m2} — ABNORMAL LOW (ref >90)
GLUCOSE: 88 mg/dL (ref 70–140)
Potassium: 3.5 mEq/L (ref 3.5–5.1)
Sodium: 139 mEq/L (ref 136–145)
TOTAL PROTEIN: 7 g/dL (ref 6.4–8.3)

## 2015-03-12 LAB — IRON AND TIBC CHCC
%SAT: 22 % (ref 21–57)
Iron: 56 ug/dL (ref 41–142)
TIBC: 258 ug/dL (ref 236–444)
UIBC: 201 ug/dL (ref 120–384)

## 2015-03-12 LAB — FERRITIN CHCC: Ferritin: 34 ng/ml (ref 9–269)

## 2015-03-12 MED ORDER — AZITHROMYCIN 250 MG PO TABS: ORAL_TABLET | ORAL | Status: DC

## 2015-03-12 NOTE — Progress Notes (Signed)
Hematology and Oncology Follow Up Visit  MELODY CAMERON [id number] February 10, 1941 74 y.o. 03/12/2015   Principle Diagnosis:   DVT of the right leg  Prothrombin II gene mutation  Hemachromatosis (homozygous for C282Y  Mutation)  Past history of right lower extremity DVT/PE  Current Therapy:   ASA 162 mg po q day  Interim History:  Ms.  Felder is back for followup. She is doing okay. She now is off Xarelto. We got her off Xarelto in July. She's doing well with the baby aspirin.  We did go ahead and do a Doppler of her right leg. This showed a stable chronic thrombus in the right femoral vein. No significant recanalization was noted.  She's not noted any problems with swelling of the right leg. She's had no pain in the right leg.  She's had no chest wall pain. She's not had any episodes of falling. She does have some issues with vertigo. I think she sees the ENT doctor this week.  Does not been a change in bowel or bladder habits. She's had no cough. There's been no fever. She's had no headache. She's had no visual problems.  Overall, her performance status is ECOG 1.   Medications:  Current outpatient prescriptions:  .  amLODipine (NORVASC) 5 MG tablet, Take 5 mg by mouth every morning. , Disp: , Rfl:  .  Biotin 5000 MCG TABS, Take 5,000 mcg by mouth every morning. , Disp: , Rfl:  .  calcium-vitamin D (OSCAL WITH D) 250-125 MG-UNIT per tablet, Take 1 tablet by mouth 2 (two) times daily., Disp: , Rfl:  .  Cholecalciferol (VITAMIN D) 2000 UNITS tablet, Take 2,000 Units by mouth 2 (two) times daily., Disp: , Rfl:  .  citalopram (CELEXA) 20 MG tablet, Take 20 mg by mouth daily after supper. , Disp: , Rfl:  .  fish oil-omega-3 fatty acids 1000 MG capsule, Take 1 g by mouth daily. , Disp: , Rfl:  .  hydrochlorothiazide (HYDRODIURIL) 25 MG tablet, Take 25 mg by mouth daily., Disp: , Rfl:  .  Multiple Vitamins-Minerals (CENTRUM SILVER PO), Take 1 tablet by mouth every morning. , Disp: ,  Rfl:  .  omeprazole (PRILOSEC) 20 MG capsule, Take 20 mg by mouth every morning. , Disp: , Rfl:  .  polycarbophil (FIBERCON) 625 MG tablet, Take 625 mg by mouth 2 (two) times daily., Disp: , Rfl:  .  pramipexole (MIRAPEX) 0.5 MG tablet, Take 0.5 mg by mouth every evening., Disp: , Rfl:  .  propranolol (INDERAL) 10 MG tablet, Take 10 mg by mouth 2 (two) times daily. , Disp: , Rfl:  .  temazepam (RESTORIL) 15 MG capsule, Take 2 capsules (30 mg total) by mouth at bedtime., Disp: 30 capsule, Rfl: 3 .  trimethoprim-polymyxin b (POLYTRIM) ophthalmic solution, Place 1 drop into both eyes every 8 (eight) hours. , Disp: , Rfl:  .  Turmeric Curcumin 500 MG CAPS, Take 500 mg by mouth at bedtime. , Disp: , Rfl:  .  vitamin E 400 UNIT capsule, Take 400 Units by mouth daily., Disp: , Rfl:  .  azithromycin (ZITHROMAX) 250 MG tablet, Take as directed.  Take 2 pills today then 1 pill a day for 4 days, Disp: 6 each, Rfl: 0  Allergies:  Allergies  Allergen Reactions  . Penicillins Anaphylaxis  . Morphine And Related Other (See Comments)    twitching  . Sulfa Antibiotics Other (See Comments)    unknown    Past Medical History, Surgical  history, Social history, and Family History were reviewed and updated.  Review of Systems: As above  Physical Exam:  height is 5\' 3"  (1.6 m) and weight is 178 lb (80.74 kg). Her temperature is 97.9 F (36.6 C). Her blood pressure is 168/85 and her pulse is 59. Her respiration is 18.   Well-developed and well-nourished white female. Head and neck exam shows no ocular or oral lesions. There are no palpable cervical or supraclavicular lymph nodes. Lungs are clear. Cardiac exam regular rate and rhythm with no murmurs, rubs or bruits.. Abdomen is soft. She has good bowel sounds. There is no fluid wave. There is no palpable liver or spleen tip. Extremities shows no clubbing, cyanosis or edema. No venous cord is noted in the right leg. She has no swelling in the right leg. She has  good range of motion of her joints. Skin exam shows no rashes, ecchymosis or petechia. Neurological exam shows no focal neurological deficits.  Lab Results  Component Value Date   WBC 6.8 03/12/2015   HGB 12.5 03/12/2015   HCT 38.7 03/12/2015   MCV 86 03/12/2015   PLT 195 03/12/2015     Chemistry      Component Value Date/Time   NA 139 03/12/2015 0942   NA 138 10/23/2014 1014   NA 137 08/17/2014 1007   K 3.5 03/12/2015 0942   K 3.8 10/23/2014 1014   K 3.3* 08/17/2014 1007   CL 96* 10/23/2014 1014   CL 96 08/17/2014 1007   CO2 32* 03/12/2015 0942   CO2 31 10/23/2014 1014   CO2 29 08/17/2014 1007   BUN 15.3 03/12/2015 0942   BUN 16 10/23/2014 1014   BUN 24* 08/17/2014 1007   CREATININE 0.9 03/12/2015 0942   CREATININE 0.7 10/23/2014 1014   CREATININE 0.97 08/17/2014 1007      Component Value Date/Time   CALCIUM 9.5 03/12/2015 0942   CALCIUM 9.0 10/23/2014 1014   CALCIUM 9.4 08/17/2014 1007   ALKPHOS 103 03/12/2015 0942   ALKPHOS 103* 10/23/2014 1014   ALKPHOS 79 08/17/2014 1007   AST 26 03/12/2015 0942   AST 34 10/23/2014 1014   AST 26 08/17/2014 1007   ALT 17 03/12/2015 0942   ALT 24 10/23/2014 1014   ALT 18 08/17/2014 1007   BILITOT 0.51 03/12/2015 0942   BILITOT 0.70 10/23/2014 1014   BILITOT 0.6 08/17/2014 1007         Impression and Plan: Ms. Ziegler is 74 year old white female with a DVT in the right leg. This happened after she fell  She has the persistent thrombus in the right leg. I don't think she will ever resolve this chronic thrombus.  As long as there is no changes with the thrombus and she develops an acute thrombus or worse, and pulmonary embolus, IV we can just keep her on aspirin.  For now, I think that we can probably get her back to see [place] in 6 months. I told her she was Lovina Reach sooner if she has any problems.  Volanda Napoleon, MD 11/14/20165:31 PM

## 2015-04-24 ENCOUNTER — Other Ambulatory Visit: Payer: Self-pay

## 2015-05-01 DIAGNOSIS — M79671 Pain in right foot: Secondary | ICD-10-CM | POA: Diagnosis not present

## 2015-05-01 DIAGNOSIS — R42 Dizziness and giddiness: Secondary | ICD-10-CM | POA: Diagnosis not present

## 2015-05-01 DIAGNOSIS — Z1239 Encounter for other screening for malignant neoplasm of breast: Secondary | ICD-10-CM | POA: Diagnosis not present

## 2015-05-02 ENCOUNTER — Other Ambulatory Visit: Payer: Self-pay | Admitting: Family Medicine

## 2015-05-02 DIAGNOSIS — Z1231 Encounter for screening mammogram for malignant neoplasm of breast: Secondary | ICD-10-CM

## 2015-05-02 DIAGNOSIS — R42 Dizziness and giddiness: Secondary | ICD-10-CM

## 2015-05-11 ENCOUNTER — Ambulatory Visit: Payer: PPO

## 2015-05-15 ENCOUNTER — Ambulatory Visit
Admission: RE | Admit: 2015-05-15 | Discharge: 2015-05-15 | Disposition: A | Discharge status: Home / Self Care | Payer: PPO | Source: Ambulatory Visit | Attending: Family Medicine | Admitting: Family Medicine

## 2015-05-15 DIAGNOSIS — Z1231 Encounter for screening mammogram for malignant neoplasm of breast: Secondary | ICD-10-CM | POA: Diagnosis not present

## 2015-05-15 DIAGNOSIS — R42 Dizziness and giddiness: Secondary | ICD-10-CM | POA: Diagnosis not present

## 2015-05-15 DIAGNOSIS — R55 Syncope and collapse: Secondary | ICD-10-CM | POA: Diagnosis not present

## 2015-05-15 MED ORDER — GADOBENATE DIMEGLUMINE 529 MG/ML IV SOLN
17.0000 mL | Freq: Once | INTRAVENOUS | Status: AC | PRN
  Administered 2015-05-15: 17 mL via INTRAVENOUS

## 2015-05-17 DIAGNOSIS — M79671 Pain in right foot: Secondary | ICD-10-CM | POA: Diagnosis not present

## 2015-05-17 DIAGNOSIS — M19071 Primary osteoarthritis, right ankle and foot: Secondary | ICD-10-CM | POA: Diagnosis not present

## 2015-06-06 DIAGNOSIS — M19012 Primary osteoarthritis, left shoulder: Secondary | ICD-10-CM | POA: Diagnosis not present

## 2015-07-09 DIAGNOSIS — M7542 Impingement syndrome of left shoulder: Secondary | ICD-10-CM | POA: Diagnosis not present

## 2015-07-09 DIAGNOSIS — M19012 Primary osteoarthritis, left shoulder: Secondary | ICD-10-CM | POA: Diagnosis not present

## 2015-07-11 DIAGNOSIS — J069 Acute upper respiratory infection, unspecified: Secondary | ICD-10-CM | POA: Diagnosis not present

## 2015-07-11 DIAGNOSIS — K219 Gastro-esophageal reflux disease without esophagitis: Secondary | ICD-10-CM | POA: Diagnosis not present

## 2015-07-11 DIAGNOSIS — I10 Essential (primary) hypertension: Secondary | ICD-10-CM | POA: Diagnosis not present

## 2015-07-18 ENCOUNTER — Emergency Department (HOSPITAL_COMMUNITY): Payer: PPO

## 2015-07-18 ENCOUNTER — Emergency Department (HOSPITAL_COMMUNITY)
Admission: EM | Admit: 2015-07-18 | Discharge: 2015-07-18 | Disposition: A | Discharge status: Home / Self Care | Payer: PPO | Attending: Emergency Medicine | Admitting: Emergency Medicine

## 2015-07-18 ENCOUNTER — Encounter (HOSPITAL_COMMUNITY): Payer: Self-pay | Admitting: Family Medicine

## 2015-07-18 DIAGNOSIS — Z86711 Personal history of pulmonary embolism: Secondary | ICD-10-CM | POA: Insufficient documentation

## 2015-07-18 DIAGNOSIS — Z79899 Other long term (current) drug therapy: Secondary | ICD-10-CM | POA: Diagnosis not present

## 2015-07-18 DIAGNOSIS — I10 Essential (primary) hypertension: Secondary | ICD-10-CM | POA: Insufficient documentation

## 2015-07-18 DIAGNOSIS — M546 Pain in thoracic spine: Secondary | ICD-10-CM | POA: Insufficient documentation

## 2015-07-18 DIAGNOSIS — Z880 Allergy status to penicillin: Secondary | ICD-10-CM | POA: Insufficient documentation

## 2015-07-18 DIAGNOSIS — Z862 Personal history of diseases of the blood and blood-forming organs and certain disorders involving the immune mechanism: Secondary | ICD-10-CM | POA: Diagnosis not present

## 2015-07-18 DIAGNOSIS — R9431 Abnormal electrocardiogram [ECG] [EKG]: Secondary | ICD-10-CM | POA: Diagnosis not present

## 2015-07-18 DIAGNOSIS — Z8639 Personal history of other endocrine, nutritional and metabolic disease: Secondary | ICD-10-CM | POA: Insufficient documentation

## 2015-07-18 DIAGNOSIS — Z86718 Personal history of other venous thrombosis and embolism: Secondary | ICD-10-CM | POA: Insufficient documentation

## 2015-07-18 DIAGNOSIS — R067 Sneezing: Secondary | ICD-10-CM | POA: Diagnosis not present

## 2015-07-18 DIAGNOSIS — M25512 Pain in left shoulder: Secondary | ICD-10-CM | POA: Diagnosis not present

## 2015-07-18 LAB — I-STAT TROPONIN, ED: Troponin i, poc: 0.01 ng/mL (ref 0.00–0.08)

## 2015-07-18 LAB — BASIC METABOLIC PANEL
Anion gap: 15 (ref 5–15)
BUN: 18 mg/dL (ref 6–20)
CHLORIDE: 96 mmol/L — AB (ref 101–111)
CO2: 27 mmol/L (ref 22–32)
CREATININE: 1 mg/dL (ref 0.44–1.00)
Calcium: 9.3 mg/dL (ref 8.9–10.3)
GFR, EST NON AFRICAN AMERICAN: 54 mL/min — AB (ref >60)
Glucose, Bld: 106 mg/dL — ABNORMAL HIGH (ref 65–99)
POTASSIUM: 2.7 mmol/L — AB (ref 3.5–5.1)
SODIUM: 138 mmol/L (ref 135–145)

## 2015-07-18 LAB — CBC
HEMATOCRIT: 35.9 % — AB (ref 36.0–46.0)
Hemoglobin: 11.9 g/dL — ABNORMAL LOW (ref 12.0–15.0)
MCH: 27.9 pg (ref 26.0–34.0)
MCHC: 33.1 g/dL (ref 30.0–36.0)
MCV: 84.3 fL (ref 78.0–100.0)
PLATELETS: 188 10*3/uL (ref 150–400)
RBC: 4.26 MIL/uL (ref 3.87–5.11)
RDW: 14.9 % (ref 11.5–15.5)
WBC: 7.5 10*3/uL (ref 4.0–10.5)

## 2015-07-18 MED ORDER — POTASSIUM CHLORIDE CRYS ER 20 MEQ PO TBCR
40.0000 meq | EXTENDED_RELEASE_TABLET | Freq: Once | ORAL | Status: AC
  Administered 2015-07-18: 40 meq via ORAL
  Filled 2015-07-18: qty 2

## 2015-07-18 MED ORDER — HYDROCODONE-ACETAMINOPHEN 5-325 MG PO TABS: 1.0000 | ORAL_TABLET | Freq: Four times a day (QID) | ORAL | Status: DC | PRN

## 2015-07-18 MED ORDER — POTASSIUM CHLORIDE 10 MEQ/100ML IV SOLN
10.0000 meq | Freq: Once | INTRAVENOUS | Status: AC
  Administered 2015-07-18: 10 meq via INTRAVENOUS
  Filled 2015-07-18: qty 100

## 2015-07-18 MED ORDER — POTASSIUM CHLORIDE ER 10 MEQ PO TBCR: 20.0000 meq | EXTENDED_RELEASE_TABLET | Freq: Every day | ORAL | Status: DC

## 2015-07-18 MED ORDER — CYCLOBENZAPRINE HCL 10 MG PO TABS: 10.0000 mg | ORAL_TABLET | Freq: Two times a day (BID) | ORAL | Status: DC | PRN

## 2015-07-18 NOTE — ED Provider Notes (Signed)
CSN: [id number]     Arrival date & time 07/18/15  0741 History   First MD Initiated Contact with Patient 07/18/15 [phone]     Chief Complaint  Patient presents with  . Shoulder Pain  . Back Pain     (Consider location/radiation/quality/duration/timing/severity/associated sxs/prior Treatment) HPI Comments: Patient presents to the emergency department with chief complaint of left upper back pain. She states pain started last night. The pain is worsened with palpation and movement. She states the pain is also worsened when she takes deep breath or when she coughs or sneezes. She denies any recent fevers, chills, body aches, chest pain, or shortness of breath. Denies any abdominal pain. Denies nausea, vomiting, or diarrhea. Of note, patient has history of PE and DVT, she was taking Xarelto until last July, now she takes 2 aspirin daily for chronic right lower extremity DVT. She is monitored by Dr. Marin Olp from heme/onc.  The history is provided by the patient. No language interpreter was used.    Past Medical History  Diagnosis Date  . Hypertension   . PE (pulmonary embolism)   . Hemochromatosis   . Prothrombin gene mutation ([place]) 05/30/2013  . Right leg DVT ([place]) 05/30/2013   Past Surgical History  Procedure Laterality Date  . Back surgery    . Shoulder surgery    . Foot surgery     History reviewed. No pertinent family history. Social History  Substance Use Topics  . Smoking status: Never Smoker   . Smokeless tobacco: Never Used     Comment: never used tobacco  . Alcohol Use: No   OB History    No data available     Review of Systems  Constitutional: Negative for fever and chills.  Respiratory: Negative for shortness of breath.   Cardiovascular: Negative for chest pain.  Gastrointestinal: Negative for nausea, vomiting, diarrhea and constipation.  Genitourinary: Negative for dysuria.  Musculoskeletal: Positive for myalgias and back pain.  All other systems reviewed and are  negative.     Allergies  Penicillins; Morphine and related; and Sulfa antibiotics  Home Medications   Prior to Admission medications   Medication Sig Start Date End Date Taking? Authorizing Provider  amLODipine (NORVASC) 5 MG tablet Take 5 mg by mouth every morning.  07/23/11   Historical Provider, MD  azithromycin (ZITHROMAX) 250 MG tablet Take as directed.  Take 2 pills today then 1 pill a day for 4 days 03/12/15   Volanda Napoleon, MD  Biotin 5000 MCG TABS Take 5,000 mcg by mouth every morning.     Historical Provider, MD  calcium-vitamin D (OSCAL WITH D) 250-125 MG-UNIT per tablet Take 1 tablet by mouth 2 (two) times daily.    Historical Provider, MD  Cholecalciferol (VITAMIN D) 2000 UNITS tablet Take 2,000 Units by mouth 2 (two) times daily.    Historical Provider, MD  citalopram (CELEXA) 20 MG tablet Take 20 mg by mouth daily after supper.  08/05/11   Historical Provider, MD  fish oil-omega-3 fatty acids 1000 MG capsule Take 1 g by mouth daily.     Historical Provider, MD  hydrochlorothiazide (HYDRODIURIL) 25 MG tablet Take 25 mg by mouth daily.    Historical Provider, MD  Multiple Vitamins-Minerals (CENTRUM SILVER PO) Take 1 tablet by mouth every morning.     Historical Provider, MD  omeprazole (PRILOSEC) 20 MG capsule Take 20 mg by mouth every morning.  08/05/11   Historical Provider, MD  polycarbophil (FIBERCON) 625 MG tablet Take 625 mg  by mouth 2 (two) times daily.    Historical Provider, MD  pramipexole (MIRAPEX) 0.5 MG tablet Take 0.5 mg by mouth every evening.    Historical Provider, MD  propranolol (INDERAL) 10 MG tablet Take 10 mg by mouth 2 (two) times daily.  07/23/11   Historical Provider, MD  temazepam (RESTORIL) 15 MG capsule Take 2 capsules (30 mg total) by mouth at bedtime. 06/22/14   Volanda Napoleon, MD  trimethoprim-polymyxin b (POLYTRIM) ophthalmic solution Place 1 drop into both eyes every 8 (eight) hours.  10/08/13   Historical Provider, MD  Turmeric Curcumin 500 MG CAPS  Take 500 mg by mouth at bedtime.     Historical Provider, MD  vitamin E 400 UNIT capsule Take 400 Units by mouth daily.    Historical Provider, MD   BP 174/91 mmHg  Pulse 80  Temp(Src) 98.3 F (36.8 C) (Oral)  Resp 18  SpO2 96% Physical Exam  Constitutional: She is oriented to person, place, and time. She appears well-developed and well-nourished.  HENT:  Head: Normocephalic and atraumatic.  Eyes: Conjunctivae and EOM are normal. Pupils are equal, round, and reactive to light.  Neck: Normal range of motion. Neck supple.  Cardiovascular: Normal rate and regular rhythm.  Exam reveals no gallop and no friction rub.   No murmur heard. Pulmonary/Chest: Effort normal and breath sounds normal. No respiratory distress. She has no wheezes. She has no rales. She exhibits no tenderness.  CTAB  Abdominal: Soft. Bowel sounds are normal. She exhibits no distension and no mass. There is no tenderness. There is no rebound and no guarding.  No focal abdominal tenderness, no RLQ tenderness or pain at McBurney's point, no RUQ tenderness or Murphy's sign, no left-sided abdominal tenderness, no fluid wave, or signs of peritonitis   Musculoskeletal: Normal range of motion. She exhibits no edema or tenderness.  Tenderness to palpation of left rhomboids and trapezius ROM and strength 5/5 No lower extremity edema or tenderness  Neurological: She is alert and oriented to person, place, and time.  Skin: Skin is warm and dry.  No evidence of rash or cellulitis  Psychiatric: She has a normal mood and affect. Her behavior is normal. Judgment and thought content normal.  Nursing note and vitals reviewed.   ED Course  Procedures (including critical care time) Labs Review Labs Reviewed  BASIC METABOLIC PANEL - Abnormal; Notable for the following:    Potassium 2.7 (*)    Chloride 96 (*)    Glucose, Bld 106 (*)    GFR calc non Af Amer 54 (*)    All other components within normal limits  CBC - Abnormal;  Notable for the following:    Hemoglobin 11.9 (*)    HCT 35.9 (*)    All other components within normal limits  I-STAT TROPOININ, ED    Imaging Review Dg Chest 2 View  07/18/2015  CLINICAL DATA:  Left shoulder pain, upper back pain starting last night, sneezing EXAM: CHEST  2 VIEW COMPARISON:  06/13/2004 and 01/10/2010 FINDINGS: Cardiomediastinal silhouette stable. Moderate size hiatal hernia. No infiltrate or pulmonary edema. Osteopenia is noted thoracic spine. Degenerative changes mid and lower thoracic spine. Atherosclerotic calcifications of thoracic aorta. IMPRESSION: No active disease.  Moderate size hiatal hernia. Electronically Signed   By: Lahoma Crocker M.D.   On: 07/18/2015 08:24   I have personally reviewed and evaluated these images and lab results as part of my medical decision-making.   EKG Interpretation   Date/Time:  Wednesday  July 18 2015 07:56:58 EDT Ventricular Rate:  76 PR Interval:  162 QRS Duration: 86 QT Interval:  400 QTC Calculation: 450 R Axis:   -21 Text Interpretation:  Normal sinus rhythm Possible Anterior infarct , age  undetermined Abnormal ECG Inferior Q waves noted Confirmed by Alvino Chapel   MD, NATHAN [phone]) on 07/18/2015 8:42:42 AM      MDM   Final diagnoses:  Left shoulder pain   Patient with left upper back pain. Patient is tender to palpation over her rhomboids. No new injuries. Suspect myalgia. No evidence of rash or shingles or cellulitis. Patient has a history of PE and DVT, but denies any chest pain or shortness breath. Vital signs are stable. She is not tachycardic nor hypoxic. I doubt recurrence of PE.  EKG negative for ischemia, chest x-ray negative for pneumonia, no leukocytosis, mild anemia at 11.9, potassium is low at 2.7, will replace this with oral and IV. Patient denies any diarrhea. She will need to have this rechecked by her primary care doctor.  Patient seen by and discussed with Dr. Alvino Chapel, who recommends giving some  potassium for home for the next couple of days.  Agrees that pain is musculoskeletal.  No further workup today.  Recommend PCP follow-up.  Return here for CP, SOB, fever, or worsening symptoms.    Montine Circle, PA-C 07/18/15 [place], MD 07/18/15 [phone]

## 2015-07-18 NOTE — Discharge Instructions (Signed)
Muscle Pain, Adult  Muscle pain (myalgia) may be caused by many things, including:  · Overuse or muscle strain, especially if you are not in shape. This is the most common cause of muscle pain.  · Injury.  · Bruises.  · Viruses, such as the flu.  · Infectious diseases.  · Fibromyalgia, which is a chronic condition that causes muscle tenderness, fatigue, and headache.  · Autoimmune diseases, including lupus.  · Certain drugs, including ACE inhibitors and statins.  Muscle pain may be mild or severe. In most cases, the pain lasts only a short time and goes away without treatment. To diagnose the cause of your muscle pain, your health care provider will take your medical history. This means he or she will ask you when your muscle pain began and what has been happening. If you have not had muscle pain for very long, your health care provider may want to wait before doing much testing. If your muscle pain has lasted a long time, your health care provider may want to run tests right away. If your health care provider thinks your muscle pain may be caused by illness, you may need to have additional tests to rule out certain conditions.   Treatment for muscle pain depends on the cause. Home care is often enough to relieve muscle pain. Your health care provider may also prescribe anti-inflammatory medicine.  HOME CARE INSTRUCTIONS  Watch your condition for any changes. The following actions may help to lessen any discomfort you are feeling:  · Only take over-the-counter or prescription medicines as directed by your health care provider.  · Apply ice to the sore muscle:    Put ice in a plastic bag.    Place a towel between your skin and the bag.    Leave the ice on for 15-20 minutes, 3-4 times a day.  · You may alternate applying hot and cold packs to the muscle as directed by your health care provider.  · If overuse is causing your muscle pain, slow down your activities until the pain goes away.    Remember that it is normal  to feel some muscle pain after starting a workout program. Muscles that have not been used often will be sore at first.    Do regular, gentle exercises if you are not usually active.    Warm up before exercising to lower your risk of muscle pain.  · Do not continue working out if the pain is very bad. Bad pain could mean you have injured a muscle.  SEEK MEDICAL CARE IF:  · Your muscle pain gets worse, and medicines do not help.  · You have muscle pain that lasts longer than 3 days.  · You have a rash or fever along with muscle pain.  · You have muscle pain after a tick bite.  · You have muscle pain while working out, even though you are in good physical condition.  · You have redness, soreness, or swelling along with muscle pain.  · You have muscle pain after starting a new medicine or changing the dose of a medicine.  SEEK IMMEDIATE MEDICAL CARE IF:  · You have trouble breathing.  · You have trouble swallowing.  · You have muscle pain along with a stiff neck, fever, and vomiting.  · You have severe muscle weakness or cannot move part of your body.  MAKE SURE YOU:   · Understand these instructions.  · Will watch your condition.  · Will get   help right away if you are not doing well or get worse.     This information is not intended to replace advice given to you by your health care provider. Make sure you discuss any questions you have with your health care provider.     Document Released: 03/06/2006 Document Revised: 05/05/2014 Document Reviewed: 02/08/2013  Elsevier Interactive Patient Education ©2016 [employer]

## 2015-07-18 NOTE — ED Notes (Signed)
Pt here for severe left shoulder pain, upper back pain. sts hurts when she takes a deep breath and sneezing. sts she can barely move. Hx of PE. Denies SOB. sts cough.

## 2015-07-18 NOTE — ED Notes (Signed)
Provider at the bedside.  

## 2015-07-20 ENCOUNTER — Emergency Department (HOSPITAL_COMMUNITY): Payer: PPO

## 2015-07-20 ENCOUNTER — Encounter (HOSPITAL_COMMUNITY): Payer: Self-pay | Admitting: Emergency Medicine

## 2015-07-20 ENCOUNTER — Emergency Department (HOSPITAL_COMMUNITY)
Admission: EM | Admit: 2015-07-20 | Discharge: 2015-07-20 | Disposition: A | Discharge status: Home / Self Care | Payer: PPO | Attending: Emergency Medicine | Admitting: Emergency Medicine

## 2015-07-20 DIAGNOSIS — I10 Essential (primary) hypertension: Secondary | ICD-10-CM | POA: Insufficient documentation

## 2015-07-20 DIAGNOSIS — Z86718 Personal history of other venous thrombosis and embolism: Secondary | ICD-10-CM | POA: Diagnosis not present

## 2015-07-20 DIAGNOSIS — Z880 Allergy status to penicillin: Secondary | ICD-10-CM | POA: Diagnosis not present

## 2015-07-20 DIAGNOSIS — Z79899 Other long term (current) drug therapy: Secondary | ICD-10-CM | POA: Insufficient documentation

## 2015-07-20 DIAGNOSIS — R079 Chest pain, unspecified: Secondary | ICD-10-CM | POA: Diagnosis not present

## 2015-07-20 DIAGNOSIS — Z86711 Personal history of pulmonary embolism: Secondary | ICD-10-CM | POA: Diagnosis not present

## 2015-07-20 DIAGNOSIS — Z862 Personal history of diseases of the blood and blood-forming organs and certain disorders involving the immune mechanism: Secondary | ICD-10-CM | POA: Insufficient documentation

## 2015-07-20 DIAGNOSIS — M546 Pain in thoracic spine: Secondary | ICD-10-CM | POA: Diagnosis not present

## 2015-07-20 LAB — I-STAT CHEM 8, ED
BUN: 15 mg/dL (ref 6–20)
CALCIUM ION: 1.05 mmol/L — AB (ref 1.13–1.30)
CHLORIDE: 94 mmol/L — AB (ref 101–111)
Creatinine, Ser: 0.9 mg/dL (ref 0.44–1.00)
Glucose, Bld: 88 mg/dL (ref 65–99)
HCT: 37 % (ref 36.0–46.0)
HEMOGLOBIN: 12.6 g/dL (ref 12.0–15.0)
POTASSIUM: 2.9 mmol/L — AB (ref 3.5–5.1)
SODIUM: 138 mmol/L (ref 135–145)
TCO2: 30 mmol/L (ref 0–100)

## 2015-07-20 LAB — D-DIMER, QUANTITATIVE (NOT AT ARMC): D DIMER QUANT: 9.7 ug{FEU}/mL — AB (ref 0.00–0.50)

## 2015-07-20 MED ORDER — METHOCARBAMOL 500 MG PO TABS: 500.0000 mg | ORAL_TABLET | Freq: Two times a day (BID) | ORAL | Status: DC

## 2015-07-20 MED ORDER — DIAZEPAM 5 MG/ML IJ SOLN
2.5000 mg | Freq: Once | INTRAMUSCULAR | Status: AC
  Administered 2015-07-20: 2.5 mg via INTRAVENOUS
  Filled 2015-07-20: qty 2

## 2015-07-20 MED ORDER — POTASSIUM CHLORIDE CRYS ER 20 MEQ PO TBCR
40.0000 meq | EXTENDED_RELEASE_TABLET | Freq: Once | ORAL | Status: AC
  Administered 2015-07-20: 40 meq via ORAL
  Filled 2015-07-20: qty 2

## 2015-07-20 MED ORDER — IOPAMIDOL (ISOVUE-370) INJECTION 76%
100.0000 mL | Freq: Once | INTRAVENOUS | Status: AC | PRN
  Administered 2015-07-20: 100 mL via INTRAVENOUS

## 2015-07-20 MED ORDER — IOPAMIDOL (ISOVUE-370) INJECTION 76%
INTRAVENOUS | Status: AC
  Filled 2015-07-20: qty 100

## 2015-07-20 MED ORDER — KETOROLAC TROMETHAMINE 30 MG/ML IJ SOLN
15.0000 mg | Freq: Once | INTRAMUSCULAR | Status: AC
  Administered 2015-07-20: 15 mg via INTRAVENOUS
  Filled 2015-07-20: qty 1

## 2015-07-20 NOTE — ED Provider Notes (Signed)
CSN: [id number]     Arrival date & time 07/20/15  [id number] History   First MD Initiated Contact with Patient 07/20/15 [phone]     Chief Complaint  Patient presents with  . Back Pain     (Consider location/radiation/quality/duration/timing/severity/associated sxs/prior Treatment) HPI   Patient is a 75 year old female with past medical history of hypertension, PE, DVT and hemochromatosis who presents to the ED with complaint of left upper back pain, onset 5 days. Patient reports having intermittent sharp/stabbing pain near her left shoulder blade. She notes the pain is worse with movement, breathing, coughing or moving her upper arms. Denies any recent fall, trauma, injury. Denies fever, chills, body 8, headache, lightheadedness, dizziness, chest pain, shortness of breath, abdominal pain, nausea, vomiting, numbness, tingling, weakness. Patient notes she was seen in the ED on 3/22 for similar symptoms, negative chest x-ray and discharged home with Vicodin and Flexeril. Patient reports having mild intermittent relief with medications at home but notes her pain significantly worse and this morning. Pt reports she was taking Xarelto until last July but now takes 2 ASA daily for chronic right lower extremity DVT. She sees Dr. Kenton Kingfisher from heme/onc.  Past Medical History  Diagnosis Date  . Hypertension   . PE (pulmonary embolism)   . Hemochromatosis   . Prothrombin gene mutation ([place]) 05/30/2013  . Right leg DVT ([place]) 05/30/2013   Past Surgical History  Procedure Laterality Date  . Back surgery    . Shoulder surgery    . Foot surgery     No family history on file. Social History  Substance Use Topics  . Smoking status: Never Smoker   . Smokeless tobacco: Never Used     Comment: never used tobacco  . Alcohol Use: No   OB History    No data available     Review of Systems  Musculoskeletal: Positive for myalgias and back pain.  All other systems reviewed and are negative.     Allergies   Penicillins; Morphine and related; and Sulfa antibiotics  Home Medications   Prior to Admission medications   Medication Sig Start Date End Date Taking? Authorizing Provider  amLODipine (NORVASC) 5 MG tablet Take 5 mg by mouth every morning.  07/23/11  Yes Historical Provider, MD  Biotin 5000 MCG TABS Take 5,000 mcg by mouth every morning.    Yes Historical Provider, MD  Black Cohosh (REMIFEMIN) 20 MG TABS Take 1 tablet by mouth 2 (two) times daily.   Yes Historical Provider, MD  calcium-vitamin D (OSCAL WITH D) 250-125 MG-UNIT per tablet Take 1 tablet by mouth 2 (two) times daily.   Yes Historical Provider, MD  Cholecalciferol (VITAMIN D) 2000 UNITS tablet Take 2,000 Units by mouth 2 (two) times daily.   Yes Historical Provider, MD  citalopram (CELEXA) 20 MG tablet Take 20 mg by mouth daily after supper.  08/05/11  Yes Historical Provider, MD  colchicine 0.6 MG tablet Take 1 tablet by mouth as needed. Gout flare up. 06/24/15  Yes Historical Provider, MD  cyclobenzaprine (FLEXERIL) 10 MG tablet Take 1 tablet (10 mg total) by mouth 2 (two) times daily as needed for muscle spasms. 07/18/15  Yes Montine Circle, PA-C  fish oil-omega-3 fatty acids 1000 MG capsule Take 1 g by mouth daily.    Yes Historical Provider, MD  hydrochlorothiazide (HYDRODIURIL) 25 MG tablet Take 25 mg by mouth daily.   Yes Historical Provider, MD  HYDROcodone-acetaminophen (NORCO/VICODIN) 5-325 MG tablet Take 1 tablet by mouth every 6 (six)  hours as needed. 07/18/15  Yes Montine Circle, PA-C  Multiple Vitamin (MULTIVITAMIN WITH MINERALS) TABS tablet Take 1 tablet by mouth daily.   Yes Historical Provider, MD  omeprazole (PRILOSEC) 20 MG capsule Take 20 mg by mouth every morning.  08/05/11  Yes Historical Provider, MD  polycarbophil (FIBERCON) 625 MG tablet Take 625 mg by mouth 2 (two) times daily.   Yes Historical Provider, MD  potassium chloride (K-DUR) 10 MEQ tablet Take 2 tablets (20 mEq total) by mouth daily. 07/18/15  Yes  Montine Circle, PA-C  pramipexole (MIRAPEX) 0.5 MG tablet Take 0.5 mg by mouth every evening.   Yes Historical Provider, MD  propranolol (INDERAL) 10 MG tablet Take 10 mg by mouth 2 (two) times daily.  07/23/11  Yes Historical Provider, MD  ranitidine (ZANTAC) 150 MG capsule Take 1 capsule by mouth daily. 07/11/15  Yes Historical Provider, MD  temazepam (RESTORIL) 15 MG capsule Take 2 capsules (30 mg total) by mouth at bedtime. Patient taking differently: Take 15 mg by mouth at bedtime.  06/22/14  Yes Volanda Napoleon, MD  trimethoprim-polymyxin b (POLYTRIM) ophthalmic solution Place 1 drop into both eyes every 8 (eight) hours.  10/08/13  Yes Historical Provider, MD  Turmeric Curcumin 500 MG CAPS Take 500 mg by mouth at bedtime.    Yes Historical Provider, MD  vitamin E 400 UNIT capsule Take 400 Units by mouth daily.   Yes Historical Provider, MD  azithromycin (ZITHROMAX) 250 MG tablet Take as directed.  Take 2 pills today then 1 pill a day for 4 days Patient not taking: Reported on 07/20/2015 03/12/15   Volanda Napoleon, MD  methocarbamol (ROBAXIN) 500 MG tablet Take 1 tablet (500 mg total) by mouth 2 (two) times daily. 07/20/15   Chesley Noon Cory Kitt, PA-C   BP 137/62 mmHg  Pulse 75  Temp(Src) 98.9 F (37.2 C) (Oral)  Resp 21  Ht 5\' 2"  (1.575 m)  Wt 82.555 kg  BMI 33.28 kg/m2  SpO2 97% Physical Exam  Constitutional: She is oriented to person, place, and time. She appears well-developed and well-nourished.  HENT:  Head: Normocephalic and atraumatic.  Eyes: Conjunctivae and EOM are normal. Right eye exhibits no discharge. Left eye exhibits no discharge. No scleral icterus.  Neck: Normal range of motion. Neck supple.  Cardiovascular: Normal rate, regular rhythm, normal heart sounds and intact distal pulses.   Pulmonary/Chest: Effort normal and breath sounds normal. No respiratory distress. She has no wheezes. She has no rales. She exhibits no tenderness.  Abdominal: Soft. Bowel sounds are  normal. She exhibits no distension and no mass. There is no tenderness. There is no rebound and no guarding.  Musculoskeletal: Normal range of motion. She exhibits tenderness. She exhibits no edema.  No C/T/L midline tenderness. FROM of back and neck. FROM of BUE extremities, dec strength due to pain. Equal grip strength bilaterally. Mild TTP over left rhomboids. Sensation intact. 2+ radial pulses.   Neurological: She is alert and oriented to person, place, and time.  Skin: Skin is warm and dry. No rash noted.  Nursing note and vitals reviewed.   ED Course  Procedures (including critical care time) Labs Review Labs Reviewed  D-DIMER, QUANTITATIVE (NOT AT [facility]) - Abnormal; Notable for the following:    D-Dimer, Quant 9.70 (*)    All other components within normal limits  I-STAT CHEM 8, ED - Abnormal; Notable for the following:    Potassium 2.9 (*)    Chloride 94 (*)  Calcium, Ion 1.05 (*)    All other components within normal limits    Imaging Review Ct Angio Chest Pe W/cm &/or Wo Cm  07/20/2015  CLINICAL DATA:  History of deep venous thrombosis. Left-sided chest pain with inspiration today. Initial encounter. EXAM: CT ANGIOGRAPHY CHEST WITH CONTRAST TECHNIQUE: Multidetector CT imaging of the chest was performed using the standard protocol during bolus administration of intravenous contrast. Multiplanar CT image reconstructions and MIPs were obtained to evaluate the vascular anatomy. CONTRAST:  100 cc Isovue 370. COMPARISON:  PA and lateral chest earlier today. CT chest 01/10/2010. FINDINGS: No pulmonary embolus is identified. Very small left pleural effusion is seen. No right pleural effusion or pericardial effusion. There is cardiomegaly. Moderate hiatal hernia is identified. There is some calcific aortic and coronary atherosclerosis. No axillary, hilar or mediastinal lymphadenopathy. The lungs demonstrate only mild dependent atelectasis. Incidentally imaged upper abdomen is unremarkable.  Lower thoracic and upper lumbar spondylosis is noted. Imaged bones are otherwise unremarkable. Review of the MIP images confirms the above findings. IMPRESSION: Negative for pulmonary embolus. Very small left pleural effusion. Cardiomegaly without edema. Scattered aortic and coronary atherosclerosis. Hiatal hernia. Electronically Signed   By: Inge Rise M.D.   On: 07/20/2015 11:42   I have personally reviewed and evaluated these images and lab results as part of my medical decision-making.   EKG Interpretation None      MDM   Final diagnoses:  Left-sided thoracic back pain    Patient presents with left upper back pain, pain worse with movement. Denies fever, shortness of breath, chest pain. History of DVT/PE, patient reports she is currently on aspirin and stopped taking her Xarelto last July. Patient was seen in the ED on 07/18/15 for same pain, negative chest x-ray, EKG negative for ischemia, low potassium at 2.7, patient was discharged home with hydrocodone and Flexeril. VSS. Exam revealed mild tenderness over left rhomboids, bilateral upper extremities neurovascular intact, no midline spine tenderness. D-dimer 9.7. Patient appears to have elevated d-dimer at baseline (~2) however her d-dimer today significantly higher. Will plan to order CT chest MG oh to evaluate for PE.  On reevaluation pt reports her pain is controlled as long as she is laying still in bed. Discussed lab results and plan for CT chest to evaluate for PE. CT chest negative for PE. I suspect patient's pain is likely musculoskeletal in etiology. Plan to discharge patient home with Robaxin, advised patient to discontinue using her Flexeril and to use her prescription of hydrocodone as prescribed as needed for pain. Advised patient to follow up with her PCP and discussed symptomatic treatment.  Evaluation does not show pathology requring ongoing emergent intervention or admission. Pt is hemodynamically stable and mentating  appropriately. Discussed findings/results and plan with patient/guardian, who agrees with plan. All questions answered. Return precautions discussed and outpatient follow up given.    Chesley Noon [place], [place] 07/20/15 1622  Merrily Pew, MD 07/23/15 [phone]

## 2015-07-20 NOTE — ED Notes (Signed)
Pt transported to CT ?

## 2015-07-20 NOTE — Discharge Instructions (Signed)
Take your medications as prescribed. I recommend discontinuing taking Flexeril while you are taking Robaxin. He may continue taking Norco as prescribed as needed for pain relief. I also recommend applying heat to affected area for 15-20 minutes 3-4 times daily. Refrain from doing any heavy lifting, repetitive movements or straining your upper arm/back for the next few days. Follow-up with your primary care provider at your scheduled appointment in 3 days. Have your potassium rechecked by her primary care provider at your appointment. Return to the emergency department if symptoms worsen or new onset of fever, difficulty breathing, chest pain, numbness, tingling, weakness.

## 2015-07-20 NOTE — ED Notes (Signed)
Elmyra Ricks, PA-C, at the bedside.

## 2015-07-20 NOTE — ED Notes (Signed)
Pt. reports persistent left upper back pain onset Monday this week , seen here 2 days ago prescribed with Vicodin , Flexeril and Kcl for muscle pain / spasms with temporary relief , pain worse this morning and increases with movement and changing positions . Respirations unlabored , denies cough or fever .

## 2015-07-24 DIAGNOSIS — G250 Essential tremor: Secondary | ICD-10-CM | POA: Diagnosis not present

## 2015-07-24 DIAGNOSIS — M546 Pain in thoracic spine: Secondary | ICD-10-CM | POA: Diagnosis not present

## 2015-09-11 ENCOUNTER — Other Ambulatory Visit: Payer: PPO

## 2015-09-11 ENCOUNTER — Ambulatory Visit: Payer: PPO | Admitting: Family

## 2015-09-14 DIAGNOSIS — H04123 Dry eye syndrome of bilateral lacrimal glands: Secondary | ICD-10-CM | POA: Diagnosis not present

## 2015-10-29 DIAGNOSIS — R42 Dizziness and giddiness: Secondary | ICD-10-CM | POA: Diagnosis not present

## 2015-10-29 DIAGNOSIS — E7800 Pure hypercholesterolemia, unspecified: Secondary | ICD-10-CM | POA: Diagnosis not present

## 2015-10-29 DIAGNOSIS — N951 Menopausal and female climacteric states: Secondary | ICD-10-CM | POA: Diagnosis not present

## 2015-10-29 DIAGNOSIS — G250 Essential tremor: Secondary | ICD-10-CM | POA: Diagnosis not present

## 2015-10-29 DIAGNOSIS — G259 Extrapyramidal and movement disorder, unspecified: Secondary | ICD-10-CM | POA: Diagnosis not present

## 2015-10-29 DIAGNOSIS — M1A0720 Idiopathic chronic gout, left ankle and foot, without tophus (tophi): Secondary | ICD-10-CM | POA: Diagnosis not present

## 2015-10-29 DIAGNOSIS — I10 Essential (primary) hypertension: Secondary | ICD-10-CM | POA: Diagnosis not present

## 2015-11-26 DIAGNOSIS — E7800 Pure hypercholesterolemia, unspecified: Secondary | ICD-10-CM | POA: Diagnosis not present

## 2015-12-18 ENCOUNTER — Ambulatory Visit
Admission: RE | Admit: 2015-12-18 | Discharge: 2015-12-18 | Disposition: A | Discharge status: Home / Self Care | Payer: PPO | Source: Ambulatory Visit | Attending: Family Medicine | Admitting: Family Medicine

## 2015-12-18 ENCOUNTER — Other Ambulatory Visit: Payer: Self-pay | Admitting: Family Medicine

## 2015-12-18 DIAGNOSIS — M1611 Unilateral primary osteoarthritis, right hip: Secondary | ICD-10-CM | POA: Diagnosis not present

## 2015-12-18 DIAGNOSIS — M25551 Pain in right hip: Secondary | ICD-10-CM

## 2015-12-18 DIAGNOSIS — R399 Unspecified symptoms and signs involving the genitourinary system: Secondary | ICD-10-CM | POA: Diagnosis not present

## 2015-12-18 DIAGNOSIS — M545 Low back pain: Secondary | ICD-10-CM | POA: Diagnosis not present

## 2015-12-21 DIAGNOSIS — R21 Rash and other nonspecific skin eruption: Secondary | ICD-10-CM | POA: Diagnosis not present

## 2016-01-22 ENCOUNTER — Other Ambulatory Visit (HOSPITAL_BASED_OUTPATIENT_CLINIC_OR_DEPARTMENT_OTHER): Payer: PPO

## 2016-01-22 ENCOUNTER — Ambulatory Visit (HOSPITAL_BASED_OUTPATIENT_CLINIC_OR_DEPARTMENT_OTHER): Payer: PPO | Admitting: Family

## 2016-01-22 ENCOUNTER — Encounter: Payer: Self-pay | Admitting: Family

## 2016-01-22 ENCOUNTER — Ambulatory Visit (HOSPITAL_BASED_OUTPATIENT_CLINIC_OR_DEPARTMENT_OTHER)
Admission: RE | Admit: 2016-01-22 | Discharge: 2016-01-22 | Disposition: A | Discharge status: Home / Self Care | Payer: PPO | Source: Ambulatory Visit | Attending: Family | Admitting: Family

## 2016-01-22 VITALS — BP 155/66 | HR 54 | Temp 97.9°F | Resp 16 | Wt 180.0 lb

## 2016-01-22 DIAGNOSIS — I82441 Acute embolism and thrombosis of right tibial vein: Secondary | ICD-10-CM | POA: Diagnosis not present

## 2016-01-22 DIAGNOSIS — D6852 Prothrombin gene mutation: Secondary | ICD-10-CM

## 2016-01-22 DIAGNOSIS — I82511 Chronic embolism and thrombosis of right femoral vein: Secondary | ICD-10-CM

## 2016-01-22 DIAGNOSIS — H04129 Dry eye syndrome of unspecified lacrimal gland: Secondary | ICD-10-CM | POA: Diagnosis not present

## 2016-01-22 DIAGNOSIS — J200 Acute bronchitis due to Mycoplasma pneumoniae: Secondary | ICD-10-CM

## 2016-01-22 DIAGNOSIS — R2241 Localized swelling, mass and lump, right lower limb: Secondary | ICD-10-CM | POA: Diagnosis not present

## 2016-01-22 LAB — COMPREHENSIVE METABOLIC PANEL
ALBUMIN: 3.4 g/dL — AB (ref 3.5–5.0)
ALK PHOS: 113 U/L (ref 40–150)
ALT: 15 U/L (ref 0–55)
ANION GAP: 12 meq/L — AB (ref 3–11)
AST: 24 U/L (ref 5–34)
BUN: 19.6 mg/dL (ref 7.0–26.0)
CALCIUM: 9.7 mg/dL (ref 8.4–10.4)
CHLORIDE: 99 meq/L (ref 98–109)
CO2: 30 mEq/L — ABNORMAL HIGH (ref 22–29)
Creatinine: 1 mg/dL (ref 0.6–1.1)
EGFR: 52 mL/min/{1.73_m2} — AB (ref >90)
Glucose: 91 mg/dl (ref 70–140)
POTASSIUM: 3.5 meq/L (ref 3.5–5.1)
Sodium: 141 mEq/L (ref 136–145)
Total Bilirubin: 0.39 mg/dL (ref 0.20–1.20)
Total Protein: 7.2 g/dL (ref 6.4–8.3)

## 2016-01-22 LAB — IRON AND TIBC
%SAT: 13 % — ABNORMAL LOW (ref 21–57)
Iron: 35 ug/dL — ABNORMAL LOW (ref 41–142)
TIBC: 256 ug/dL (ref 236–444)
UIBC: 222 ug/dL (ref 120–384)

## 2016-01-22 LAB — CBC WITH DIFFERENTIAL (CANCER CENTER ONLY)
BASO#: 0 10*3/uL (ref 0.0–0.2)
BASO%: 0.6 % (ref 0.0–2.0)
EOS ABS: 0.3 10*3/uL (ref 0.0–0.5)
EOS%: 6.5 % (ref 0.0–7.0)
HEMATOCRIT: 35.9 % (ref 34.8–46.6)
HEMOGLOBIN: 11.8 g/dL (ref 11.6–15.9)
LYMPH#: 1.6 10*3/uL (ref 0.9–3.3)
LYMPH%: 32.5 % (ref 14.0–48.0)
MCH: 27.9 pg (ref 26.0–34.0)
MCHC: 32.9 g/dL (ref 32.0–36.0)
MCV: 85 fL (ref 81–101)
MONO#: 0.5 10*3/uL (ref 0.1–0.9)
MONO%: 10.5 % (ref 0.0–13.0)
NEUT%: 49.9 % (ref 39.6–80.0)
NEUTROS ABS: 2.5 10*3/uL (ref 1.5–6.5)
Platelets: 205 10*3/uL (ref 145–400)
RBC: 4.23 10*6/uL (ref 3.70–5.32)
RDW: 15.4 % (ref 11.1–15.7)
WBC: 5 10*3/uL (ref 3.9–10.0)

## 2016-01-22 LAB — FERRITIN: Ferritin: 20 ng/ml (ref 9–269)

## 2016-01-22 NOTE — Progress Notes (Signed)
Hematology and Oncology Follow Up Visit  SAE EUTSLER [id number] 1941/02/25 75 y.o. 01/22/2016   Principle Diagnosis:  DVT of the right leg Prothrombin II gene mutation Hemachromatosis (homozygous for C282Y Mutation) Past history of right lower extremity DVT/PE  Current Therapy:   Aspirin 162 mg PO daily    Interim History:  Ms. Wareing is here today for a follow-up. She is doing well but has noticed a "lump" on the inner portion of her right leg right above the ankle. [place] today showed No acute DVT but chronic DVT of the right femoral vein with partial recanalization. She is taking her 2 baby aspirin daily as prescribed.  Her ferritin has been stable and is 20 with iron saturation of 13% at this time.  No fever, chills, n/v, cough, rash, dizziness, chest pain, palpitations, abdominal pain, palpitations, abdominal pain or changes with bowel or bladder habits.  She has occasional SOB with exertion.  No swelling, tenderness, numbness or tingling in her extremities. No new aches or pains.  She has maintained a good appetite and is staying well hydrated. Her weight is stable.   Medications:    Medication List       Accurate as of 01/22/16  1:13 PM. Always use your most recent med list.          amLODipine 5 MG tablet Commonly known as:  NORVASC Take 5 mg by mouth every morning.   Biotin 5000 MCG Tabs Take 5,000 mcg by mouth every morning.   calcium-vitamin D 250-125 MG-UNIT tablet Commonly known as:  OSCAL WITH D Take 1 tablet by mouth 2 (two) times daily.   citalopram 20 MG tablet Commonly known as:  CELEXA Take 20 mg by mouth daily after supper.   colchicine 0.6 MG tablet Take 1 tablet by mouth as needed. Gout flare up.   cyclobenzaprine 10 MG tablet Commonly known as:  FLEXERIL Take 1 tablet (10 mg total) by mouth 2 (two) times daily as needed for muscle spasms.   fish oil-omega-3 fatty acids 1000 MG capsule Take 1 g by mouth daily.   hydrochlorothiazide 25 MG  tablet Commonly known as:  HYDRODIURIL Take 25 mg by mouth daily.   HYDROcodone-acetaminophen 5-325 MG tablet Commonly known as:  NORCO/VICODIN Take 1 tablet by mouth every 6 (six) hours as needed.   methocarbamol 500 MG tablet Commonly known as:  ROBAXIN Take 1 tablet (500 mg total) by mouth 2 (two) times daily.   multivitamin with minerals Tabs tablet Take 1 tablet by mouth daily.   omeprazole 20 MG capsule Commonly known as:  PRILOSEC Take 20 mg by mouth every morning.   pramipexole 0.5 MG tablet Commonly known as:  MIRAPEX Take 0.5 mg by mouth every evening.   propranolol 10 MG tablet Commonly known as:  INDERAL Take 10 mg by mouth 2 (two) times daily.   temazepam 15 MG capsule Commonly known as:  RESTORIL Take 2 capsules (30 mg total) by mouth at bedtime.   trimethoprim-polymyxin b ophthalmic solution Commonly known as:  POLYTRIM Place 1 drop into both eyes every 8 (eight) hours.   Turmeric Curcumin 500 MG Caps Take 500 mg by mouth at bedtime.   Vitamin D 2000 units tablet Take 2,000 Units by mouth 2 (two) times daily.   vitamin E 400 UNIT capsule Take 400 Units by mouth daily.       Allergies:  Allergies  Allergen Reactions  . Penicillins Anaphylaxis  . Morphine And Related Other (See Comments)  twitching  . Sulfa Antibiotics Other (See Comments)    unknown    Past Medical History, Surgical history, Social history, and Family History were reviewed and updated.  Review of Systems: All other 10 point review of systems is negative.   Physical Exam:  weight is 180 lb (81.6 kg). Her oral temperature is 97.9 F (36.6 C). Her blood pressure is 155/66 (abnormal) and her pulse is 54 (abnormal). Her respiration is 16.   Wt Readings from Last 3 Encounters:  01/22/16 180 lb (81.6 kg)  07/20/15 182 lb (82.6 kg)  03/12/15 178 lb (80.7 kg)    Ocular: Sclerae unicteric, pupils equal, round and reactive to light Ear-nose-throat: Oropharynx clear,  dentition fair Lymphatic: No cervical supraclavicular or axillary adenopathy Lungs no rales or rhonchi, good excursion bilaterally Heart regular rate and rhythm, no murmur appreciated Abd soft, nontender, positive bowel sounds, no liver or spleen tip palpated on exam MSK no focal spinal tenderness, no joint edema Neuro: non-focal, well-oriented, appropriate affect Breasts: Deferred  Lab Results  Component Value Date   WBC 5.0 01/22/2016   HGB 11.8 01/22/2016   HCT 35.9 01/22/2016   MCV 85 01/22/2016   PLT 205 01/22/2016   Lab Results  Component Value Date   FERRITIN 34 03/12/2015   IRON 56 03/12/2015   TIBC 258 03/12/2015   UIBC 201 03/12/2015   IRONPCTSAT 22 03/12/2015   Lab Results  Component Value Date   RETICCTPCT 0.9 05/11/2014   RBC 4.23 01/22/2016   RETICCTABS 39.7 05/11/2014   No results found for: KPAFRELGTCHN, LAMBDASER, KAPLAMBRATIO No results found for: IGGSERUM, IGA, IGMSERUM No results found for: Odetta Pink, SPEI   Chemistry      Component Value Date/Time   NA 138 07/20/2015 0754   NA 139 03/12/2015 0942   K 2.9 (L) 07/20/2015 0754   K 3.5 03/12/2015 0942   CL 94 (L) 07/20/2015 0754   CL 96 (L) 10/23/2014 1014   CO2 27 07/18/2015 0754   CO2 32 (H) 03/12/2015 0942   BUN 15 07/20/2015 0754   BUN 15.3 03/12/2015 0942   CREATININE 0.90 07/20/2015 0754   CREATININE 0.9 03/12/2015 0942      Component Value Date/Time   CALCIUM 9.3 07/18/2015 0754   CALCIUM 9.5 03/12/2015 0942   ALKPHOS 103 03/12/2015 0942   AST 26 03/12/2015 0942   ALT 17 03/12/2015 0942   BILITOT 0.51 03/12/2015 0942     Impression and Plan: Ms. Copeman is 75 yo white female with a chronic DVT of the right leg. She has c/o a "lump" on the inner portion of her right leg above the ankle. [place] today showed some soft tissue swelling around the ankle. There is no redness or edema visualized. Tthe area is not painful. No acute DVT but  chronic DVT of the right femoral vein with partial recanalization.  She will continue on 2 baby aspirin daily. Her hemochromatosis has not been an issue. She has not been phlebotomized in almost 10 years. Her ferritin is stable at 20 with an iron saturation of 13%.  We will plan to see her back in 6 months for labs and follow-up.  She knows to contact our office with any questions or concerns. We can certainly see her sooner if need be.  Eliezer Bottom, NP 9/26/20171:13 PM

## 2016-01-25 ENCOUNTER — Other Ambulatory Visit: Payer: PPO

## 2016-01-25 ENCOUNTER — Ambulatory Visit: Payer: PPO | Admitting: Family

## 2016-04-10 DIAGNOSIS — R229 Localized swelling, mass and lump, unspecified: Secondary | ICD-10-CM | POA: Diagnosis not present

## 2016-05-07 DIAGNOSIS — M545 Low back pain: Secondary | ICD-10-CM | POA: Diagnosis not present

## 2016-05-19 ENCOUNTER — Ambulatory Visit
Admission: RE | Admit: 2016-05-19 | Discharge: 2016-05-19 | Disposition: A | Discharge status: Home / Self Care | Payer: PPO | Source: Ambulatory Visit | Attending: Family Medicine | Admitting: Family Medicine

## 2016-05-19 ENCOUNTER — Other Ambulatory Visit: Payer: Self-pay | Admitting: Family Medicine

## 2016-05-19 DIAGNOSIS — R600 Localized edema: Secondary | ICD-10-CM | POA: Diagnosis not present

## 2016-05-19 DIAGNOSIS — G259 Extrapyramidal and movement disorder, unspecified: Secondary | ICD-10-CM | POA: Diagnosis not present

## 2016-05-19 DIAGNOSIS — N183 Chronic kidney disease, stage 3 (moderate): Secondary | ICD-10-CM | POA: Diagnosis not present

## 2016-05-19 DIAGNOSIS — R2241 Localized swelling, mass and lump, right lower limb: Secondary | ICD-10-CM

## 2016-05-19 DIAGNOSIS — J200 Acute bronchitis due to Mycoplasma pneumoniae: Secondary | ICD-10-CM | POA: Diagnosis not present

## 2016-05-19 DIAGNOSIS — J40 Bronchitis, not specified as acute or chronic: Secondary | ICD-10-CM

## 2016-05-19 DIAGNOSIS — G250 Essential tremor: Secondary | ICD-10-CM | POA: Diagnosis not present

## 2016-05-19 DIAGNOSIS — R05 Cough: Secondary | ICD-10-CM | POA: Diagnosis not present

## 2016-05-19 DIAGNOSIS — N951 Menopausal and female climacteric states: Secondary | ICD-10-CM | POA: Diagnosis not present

## 2016-05-19 DIAGNOSIS — K219 Gastro-esophageal reflux disease without esophagitis: Secondary | ICD-10-CM | POA: Diagnosis not present

## 2016-05-19 DIAGNOSIS — M8580 Other specified disorders of bone density and structure, unspecified site: Secondary | ICD-10-CM | POA: Diagnosis not present

## 2016-05-19 DIAGNOSIS — I10 Essential (primary) hypertension: Secondary | ICD-10-CM | POA: Diagnosis not present

## 2016-05-19 DIAGNOSIS — E7800 Pure hypercholesterolemia, unspecified: Secondary | ICD-10-CM | POA: Diagnosis not present

## 2016-05-19 DIAGNOSIS — M5431 Sciatica, right side: Secondary | ICD-10-CM | POA: Diagnosis not present

## 2016-06-16 DIAGNOSIS — M79604 Pain in right leg: Secondary | ICD-10-CM | POA: Diagnosis not present

## 2016-06-16 DIAGNOSIS — R05 Cough: Secondary | ICD-10-CM | POA: Diagnosis not present

## 2016-07-01 DIAGNOSIS — I10 Essential (primary) hypertension: Secondary | ICD-10-CM | POA: Diagnosis not present

## 2016-07-01 DIAGNOSIS — M5416 Radiculopathy, lumbar region: Secondary | ICD-10-CM | POA: Diagnosis not present

## 2016-07-01 DIAGNOSIS — M545 Low back pain: Secondary | ICD-10-CM | POA: Diagnosis not present

## 2016-07-01 DIAGNOSIS — Z6833 Body mass index (BMI) 33.0-33.9, adult: Secondary | ICD-10-CM | POA: Diagnosis not present

## 2016-07-14 DIAGNOSIS — M4316 Spondylolisthesis, lumbar region: Secondary | ICD-10-CM | POA: Diagnosis not present

## 2016-07-17 DIAGNOSIS — M5416 Radiculopathy, lumbar region: Secondary | ICD-10-CM | POA: Diagnosis not present

## 2016-07-17 DIAGNOSIS — M545 Low back pain: Secondary | ICD-10-CM | POA: Diagnosis not present

## 2016-07-18 DIAGNOSIS — M5431 Sciatica, right side: Secondary | ICD-10-CM | POA: Diagnosis not present

## 2016-07-18 DIAGNOSIS — K625 Hemorrhage of anus and rectum: Secondary | ICD-10-CM | POA: Diagnosis not present

## 2016-07-18 DIAGNOSIS — K648 Other hemorrhoids: Secondary | ICD-10-CM | POA: Diagnosis not present

## 2016-07-24 ENCOUNTER — Ambulatory Visit: Payer: PPO | Admitting: Family

## 2016-07-24 ENCOUNTER — Other Ambulatory Visit: Payer: PPO

## 2016-07-28 ENCOUNTER — Ambulatory Visit (HOSPITAL_BASED_OUTPATIENT_CLINIC_OR_DEPARTMENT_OTHER): Payer: PPO | Admitting: Hematology & Oncology

## 2016-07-28 ENCOUNTER — Other Ambulatory Visit (HOSPITAL_BASED_OUTPATIENT_CLINIC_OR_DEPARTMENT_OTHER): Payer: PPO

## 2016-07-28 VITALS — BP 155/69 | HR 57 | Temp 98.0°F | Resp 20 | Wt 185.4 lb

## 2016-07-28 DIAGNOSIS — I82501 Chronic embolism and thrombosis of unspecified deep veins of right lower extremity: Secondary | ICD-10-CM

## 2016-07-28 DIAGNOSIS — D6852 Prothrombin gene mutation: Secondary | ICD-10-CM

## 2016-07-28 DIAGNOSIS — Z7982 Long term (current) use of aspirin: Secondary | ICD-10-CM | POA: Diagnosis not present

## 2016-07-28 DIAGNOSIS — I82441 Acute embolism and thrombosis of right tibial vein: Secondary | ICD-10-CM

## 2016-07-28 LAB — CBC WITH DIFFERENTIAL (CANCER CENTER ONLY)
BASO#: 0 10*3/uL (ref 0.0–0.2)
BASO%: 0.6 % (ref 0.0–2.0)
EOS ABS: 0.4 10*3/uL (ref 0.0–0.5)
EOS%: 8 % — ABNORMAL HIGH (ref 0.0–7.0)
HEMATOCRIT: 34.4 % — AB (ref 34.8–46.6)
HEMOGLOBIN: 10.9 g/dL — AB (ref 11.6–15.9)
LYMPH#: 1.6 10*3/uL (ref 0.9–3.3)
LYMPH%: 33.1 % (ref 14.0–48.0)
MCH: 27 pg (ref 26.0–34.0)
MCHC: 31.7 g/dL — ABNORMAL LOW (ref 32.0–36.0)
MCV: 85 fL (ref 81–101)
MONO#: 0.6 10*3/uL (ref 0.1–0.9)
MONO%: 12.7 % (ref 0.0–13.0)
NEUT%: 45.6 % (ref 39.6–80.0)
NEUTROS ABS: 2.2 10*3/uL (ref 1.5–6.5)
Platelets: 177 10*3/uL (ref 145–400)
RBC: 4.03 10*6/uL (ref 3.70–5.32)
RDW: 15.1 % (ref 11.1–15.7)
WBC: 4.9 10*3/uL (ref 3.9–10.0)

## 2016-07-28 LAB — COMPREHENSIVE METABOLIC PANEL
ALBUMIN: 3.6 g/dL (ref 3.5–5.0)
ALK PHOS: 84 U/L (ref 40–150)
ALT: 17 U/L (ref 0–55)
ANION GAP: 8 meq/L (ref 3–11)
AST: 24 U/L (ref 5–34)
BILIRUBIN TOTAL: 0.41 mg/dL (ref 0.20–1.20)
BUN: 22.6 mg/dL (ref 7.0–26.0)
CO2: 33 mEq/L — ABNORMAL HIGH (ref 22–29)
Calcium: 9.5 mg/dL (ref 8.4–10.4)
Chloride: 101 mEq/L (ref 98–109)
Creatinine: 1 mg/dL (ref 0.6–1.1)
EGFR: 57 mL/min/{1.73_m2} — ABNORMAL LOW (ref >90)
GLUCOSE: 91 mg/dL (ref 70–140)
Potassium: 3.6 mEq/L (ref 3.5–5.1)
Sodium: 142 mEq/L (ref 136–145)
TOTAL PROTEIN: 7.1 g/dL (ref 6.4–8.3)

## 2016-07-28 LAB — IRON AND TIBC
%SAT: 11 % — ABNORMAL LOW (ref 21–57)
IRON: 30 ug/dL — AB (ref 41–142)
TIBC: 274 ug/dL (ref 236–444)
UIBC: 244 ug/dL (ref 120–384)

## 2016-07-28 LAB — FERRITIN: Ferritin: 19 ng/ml (ref 9–269)

## 2016-07-28 NOTE — Progress Notes (Signed)
Hematology and Oncology Follow Up Visit  [name] [id number] [birth year] 76 y.o. 07/28/2016   Principle Diagnosis:  DVT of the right leg Prothrombin II gene mutation Hemachromatosis (homozygous for C282Y Mutation) Past history of right lower extremity DVT/PE  Current Therapy:   Aspirin 162 mg PO daily    Interim History:  [name] is here today for a follow-up. She is not feeling as well as. She is having a lot of back pain with pain radiating down her right leg. She has had back surgery about 15 years ago. She apparently an MRI done a couple weeks ago. She does not know the results.  She has had no problems with bowels or bladder.  she has had no issues with iron overload. We last saw her, her ferritin was 20 with iron saturation of only 13%.  Her last Doppler of the right leg back in September showed a chronic thrombus in the femoral vein.  She has had some issues with reflux. She has had a lot of wheezing. She's had a lot of heartburn. She does not see a gastroenterologist. She by needs to see a gastroenterologist. I will see about making her a referral.   She has had no fever. She was able to enjoy Easter. She really did not eat all that much because of the reflux and dyspepsia. She is taking Prilosec.   Overall, her performance status is ECOG 1.   Medications:  Allergies as of 07/28/2016      Reactions   Penicillins Anaphylaxis   Morphine And Related Other (See Comments)   twitching   Sulfa Antibiotics Other (See Comments)   unknown      Medication List       Accurate as of 07/28/16 12:20 PM. Always use your most recent med list.          amLODipine 5 MG tablet Commonly known as:  NORVASC Take 5 mg by mouth every morning.   Biotin 5000 MCG Tabs Take 5,000 mcg by mouth every morning.   calcium-vitamin D 250-125 MG-UNIT tablet Commonly known as:  OSCAL WITH D Take 1 tablet by mouth 2 (two) times daily.   citalopram 20 MG tablet Commonly known as:   CELEXA Take 20 mg by mouth daily after supper.   colchicine 0.6 MG tablet Take 1 tablet by mouth as needed. Gout flare up.   cyclobenzaprine 10 MG tablet Commonly known as:  FLEXERIL Take 1 tablet (10 mg total) by mouth 2 (two) times daily as needed for muscle spasms.   fish oil-omega-3 fatty acids 1000 MG capsule Take 1 g by mouth daily.   hydrochlorothiazide 25 MG tablet Commonly known as:  HYDRODIURIL Take 25 mg by mouth daily.   HYDROcodone-acetaminophen 5-325 MG tablet Commonly known as:  NORCO/VICODIN Take 1 tablet by mouth every 6 (six) hours as needed.   methocarbamol 500 MG tablet Commonly known as:  ROBAXIN Take 1 tablet (500 mg total) by mouth 2 (two) times daily.   multivitamin with minerals Tabs tablet Take 1 tablet by mouth daily.   omeprazole 20 MG capsule Commonly known as:  PRILOSEC Take 20 mg by mouth every morning.   oxyCODONE-acetaminophen 5-325 MG tablet Commonly known as:  PERCOCET/ROXICET   pramipexole 0.5 MG tablet Commonly known as:  MIRAPEX Take 0.5 mg by mouth every evening.   propranolol 10 MG tablet Commonly known as:  INDERAL Take 10 mg by mouth 2 (two) times daily.   temazepam 15 MG capsule Commonly known  as:  RESTORIL Take 2 capsules (30 mg total) by mouth at bedtime.   trimethoprim-polymyxin b ophthalmic solution Commonly known as:  POLYTRIM Place 1 drop into both eyes every 8 (eight) hours.   Turmeric Curcumin 500 MG Caps Take 500 mg by mouth at bedtime.   Vitamin D 2000 units tablet Take 2,000 Units by mouth 2 (two) times daily.   vitamin E 400 UNIT capsule Take 400 Units by mouth daily.       Allergies:  Allergies  Allergen Reactions  . Penicillins Anaphylaxis  . Morphine And Related Other (See Comments)    twitching  . Sulfa Antibiotics Other (See Comments)    unknown    Past Medical History, Surgical history, Social history, and Family History were reviewed and updated.  Review of Systems: All other 10  point review of systems is negative.   Physical Exam:  weight is 185 lb 6.4 oz (84.1 kg). Her oral temperature is 98 F (36.7 C). Her blood pressure is 155/69 (abnormal) and her pulse is 57 (abnormal). Her respiration is 20 and oxygen saturation is 100%.   Wt Readings from Last 3 Encounters:  07/28/16 185 lb 6.4 oz (84.1 kg)  01/22/16 180 lb (81.6 kg)  07/20/15 182 lb (82.6 kg)    Well-developed and well-nourished white female. Head and neck exam shows no ocular or oral lesions. There are no palpable cervical or supraclavicular lymph nodes. Lungs are clear. Cardiac exam regular rate and rhythm with no murmurs, rubs or bruits.. Abdomen is soft. She has good bowel sounds. There is no fluid wave. There is no palpable liver or spleen tip. Extremities shows no clubbing, cyanosis or edema. No venous cord is noted in the right leg. She has no swelling in the right leg. She has good range of motion of her joints. Skin exam shows no rashes, ecchymosis or petechia. Neurological exam shows no focal neurological deficits.: Deferred  Lab Results  Component Value Date   WBC 4.9 07/28/2016   HGB 10.9 (L) 07/28/2016   HCT 34.4 (L) 07/28/2016   MCV 85 07/28/2016   PLT 177 07/28/2016   Lab Results  Component Value Date   FERRITIN 20 01/22/2016   IRON 35 (L) 01/22/2016   TIBC 256 01/22/2016   UIBC 222 01/22/2016   IRONPCTSAT 13 (L) 01/22/2016   Lab Results  Component Value Date   RETICCTPCT 0.9 05/11/2014   RBC 4.03 07/28/2016   RETICCTABS 39.7 05/11/2014   No results found for: KPAFRELGTCHN, LAMBDASER, KAPLAMBRATIO No results found for: IGGSERUM, IGA, IGMSERUM No results found for: Odetta Pink, SPEI   Chemistry      Component Value Date/Time   NA 141 01/22/2016 1034   K 3.5 01/22/2016 1034   CL 94 (L) 07/20/2015 0754   CL 96 (L) 10/23/2014 1014   CO2 30 (H) 01/22/2016 1034   BUN 19.6 01/22/2016 1034   CREATININE 1.0 01/22/2016 1034        Component Value Date/Time   CALCIUM 9.7 01/22/2016 1034   ALKPHOS 113 01/22/2016 1034   AST 24 01/22/2016 1034   ALT 15 01/22/2016 1034   BILITOT 0.39 01/22/2016 1034     Impression and Plan: [name] is 76 yo white female with a chronic DVT of the right leg. She is on aspirin. Both the aspirin might be causing some of the dyspepsia. However, I think needs to be on aspirin. If not, then we'll have to get her on low-dose  Xarelto or ELIQUIS.   It sounds like she does have a herniated disc causing the pain in the right leg. She sees Dr. Earle Gell of neurosurgery.  I will plan to see her back in another couple months. I think we have to follow her closely with all these issues going on right now.  Volanda Napoleon, MD 4/2/201812:20 PM

## 2016-07-29 ENCOUNTER — Telehealth: Payer: Self-pay

## 2016-07-29 NOTE — Telephone Encounter (Addendum)
-----   Message from Eliezer Bottom, NP sent at 07/29/2016  2:03 PM EDT ----- Regarding: Iron/Hemochromatosis Iron studies stable. No phlebotomy needed. Thank you!  Sarah  Above message left on personalized VM with instructions to contact office for questions. dph

## 2016-08-18 DIAGNOSIS — M5416 Radiculopathy, lumbar region: Secondary | ICD-10-CM | POA: Diagnosis not present

## 2016-08-27 ENCOUNTER — Other Ambulatory Visit: Payer: Self-pay | Admitting: Neurosurgery

## 2016-08-27 DIAGNOSIS — M5416 Radiculopathy, lumbar region: Secondary | ICD-10-CM

## 2016-09-04 ENCOUNTER — Ambulatory Visit
Admission: RE | Admit: 2016-09-04 | Discharge: 2016-09-04 | Disposition: A | Discharge status: Home / Self Care | Payer: PPO | Source: Ambulatory Visit | Attending: Neurosurgery | Admitting: Neurosurgery

## 2016-09-04 DIAGNOSIS — M48061 Spinal stenosis, lumbar region without neurogenic claudication: Secondary | ICD-10-CM | POA: Diagnosis not present

## 2016-09-04 DIAGNOSIS — M5416 Radiculopathy, lumbar region: Secondary | ICD-10-CM

## 2016-09-04 MED ORDER — IOPAMIDOL (ISOVUE-M 200) INJECTION 41%
15.0000 mL | Freq: Once | INTRAMUSCULAR | Status: AC
  Administered 2016-09-04: 15 mL via INTRATHECAL

## 2016-09-04 MED ORDER — DIAZEPAM 5 MG PO TABS
5.0000 mg | ORAL_TABLET | Freq: Once | ORAL | Status: AC
  Administered 2016-09-04: 5 mg via ORAL

## 2016-09-04 NOTE — Discharge Instructions (Signed)
Myelogram Discharge Instructions  1. Go home and rest quietly for the next 24 hours.  It is important to lie flat for the next 24 hours.  Get up only to go to the restroom.  You may lie in the bed or on a couch on your back, your stomach, your left side or your right side.  You may have one pillow under your head.  You may have pillows between your knees while you are on your side or under your knees while you are on your back.  2. DO NOT drive today.  Recline the seat as far back as it will go, while still wearing your seat belt, on the way home.  3. You may get up to go to the bathroom as needed.  You may sit up for 10 minutes to eat.  You may resume your normal diet and medications unless otherwise indicated.  Drink lots of extra fluids today and tomorrow.  4. The incidence of headache, nausea, or vomiting is about 5% (one in 20 patients).  If you develop a headache, lie flat and drink plenty of fluids until the headache goes away.  Caffeinated beverages may be helpful.  If you develop severe nausea and vomiting or a headache that does not go away with flat bed rest, call [phone].  5. You may resume normal activities after your 24 hours of bed rest is over; however, do not exert yourself strongly or do any heavy lifting tomorrow. If when you get up you have a headache when standing, go back to bed and force fluids for another 24 hours.  6. Call your physician for a follow-up appointment.  The results of your myelogram will be sent directly to your physician by the following day.  7. If you have any questions or if complications develop after you arrive home, please call [phone].  Discharge instructions have been explained to the patient.  The patient, or the person responsible for the patient, fully understands these instructions.       May resume Celexa on Sep 05, 2016, after 9:30 am.

## 2016-09-15 DIAGNOSIS — H1859 Other hereditary corneal dystrophies: Secondary | ICD-10-CM | POA: Diagnosis not present

## 2016-09-15 DIAGNOSIS — Z961 Presence of intraocular lens: Secondary | ICD-10-CM | POA: Diagnosis not present

## 2016-09-15 DIAGNOSIS — Z135 Encounter for screening for eye and ear disorders: Secondary | ICD-10-CM | POA: Diagnosis not present

## 2016-09-15 DIAGNOSIS — H04123 Dry eye syndrome of bilateral lacrimal glands: Secondary | ICD-10-CM | POA: Diagnosis not present

## 2016-09-15 DIAGNOSIS — H5203 Hypermetropia, bilateral: Secondary | ICD-10-CM | POA: Diagnosis not present

## 2016-09-19 DIAGNOSIS — I10 Essential (primary) hypertension: Secondary | ICD-10-CM | POA: Diagnosis not present

## 2016-09-19 DIAGNOSIS — Z6832 Body mass index (BMI) 32.0-32.9, adult: Secondary | ICD-10-CM | POA: Diagnosis not present

## 2016-09-23 ENCOUNTER — Other Ambulatory Visit: Payer: Self-pay | Admitting: Family Medicine

## 2016-09-23 DIAGNOSIS — Z1231 Encounter for screening mammogram for malignant neoplasm of breast: Secondary | ICD-10-CM

## 2016-09-26 ENCOUNTER — Other Ambulatory Visit: Payer: Self-pay | Admitting: Neurosurgery

## 2016-10-06 ENCOUNTER — Ambulatory Visit
Admission: RE | Admit: 2016-10-06 | Discharge: 2016-10-06 | Disposition: A | Discharge status: Home / Self Care | Payer: PPO | Source: Ambulatory Visit | Attending: Family Medicine | Admitting: Family Medicine

## 2016-10-06 DIAGNOSIS — Z1231 Encounter for screening mammogram for malignant neoplasm of breast: Secondary | ICD-10-CM

## 2016-10-27 ENCOUNTER — Ambulatory Visit: Payer: PPO | Admitting: Hematology & Oncology

## 2016-10-27 ENCOUNTER — Other Ambulatory Visit: Payer: PPO

## 2016-10-27 DIAGNOSIS — M19041 Primary osteoarthritis, right hand: Secondary | ICD-10-CM | POA: Diagnosis not present

## 2016-10-27 DIAGNOSIS — M48062 Spinal stenosis, lumbar region with neurogenic claudication: Secondary | ICD-10-CM | POA: Diagnosis not present

## 2016-10-27 DIAGNOSIS — K210 Gastro-esophageal reflux disease with esophagitis: Secondary | ICD-10-CM | POA: Diagnosis not present

## 2016-10-31 ENCOUNTER — Other Ambulatory Visit (HOSPITAL_COMMUNITY): Payer: Self-pay | Admitting: *Deleted

## 2016-10-31 NOTE — Pre-Procedure Instructions (Signed)
[name]  10/31/2016    Your procedure is scheduled on Wednesday, November 12, 2016 at 12:30 PM.   Report to [facility] Entrance "A" Admitting Office at 10:30 AM.   Call this number if you have problems the morning of surgery: [phone]   Questions prior to day of surgery, please call [phone] between 8 & 4 PM.   Remember:  Do not eat food or drink liquids after midnight Tuesday, 11/11/16.  Take these medicines the morning of surgery with A SIP OF WATER: Amlodipine (Norvasc), Propanolol (Inderal), Oxycodone - if needed, eye drops.  Stop all Vitamins, Aspirin, Herbal Medications and Fish Oil 7 days prior to surgery. Do not use NSAIDS (Ibuprofen, Aleve, etc.) 7 days prior to surgery.   Do not wear jewelry, make-up or nail polish.  Do not wear lotions, powders, perfumes or deodorant.  Do not shave 48 hours prior to surgery.    Do not bring valuables to the hospital.  [facility] is not responsible for any belongings or valuables.  Contacts, dentures or bridgework may not be worn into surgery.  Leave your suitcase in the car.  After surgery it may be brought to your room.  For patients admitted to the hospital, discharge time will be determined by your treatment team.    [facility] - Preparing for Surgery  Before surgery, you can play an important role.  Because skin is not sterile, your skin needs to be as free of germs as possible.  You can reduce the number of germs on you skin by washing with CHG (chlorahexidine gluconate) soap before surgery.  CHG is an antiseptic cleaner which kills germs and bonds with the skin to continue killing germs even after washing.  Please DO NOT use if you have an allergy to CHG or antibacterial soaps.  If your skin becomes reddened/irritated stop using the CHG and inform your nurse when you arrive at Short Stay.  Do not shave (including legs and underarms) for at least 48 hours prior to the first CHG shower.  You may shave your  face.  Please follow these instructions carefully:   1.  Shower with CHG Soap the night before surgery and the                    morning of Surgery.  2.  If you choose to wash your hair, wash your hair first as usual with your       normal shampoo.  3.  After you shampoo, rinse your hair and body thoroughly to remove the shampoo.  4.  Use CHG as you would any other liquid soap.  You can apply chg directly       to the skin and wash gently with scrungie or a clean washcloth.  5.  Apply the CHG Soap to your body ONLY FROM THE NECK DOWN.        Do not use on open wounds or open sores.  Avoid contact with your eyes, ears, mouth and genitals (private parts).  Wash genitals (private parts) with your normal soap.  6.  Wash thoroughly, paying special attention to the area where your surgery        will be performed.  7.  Thoroughly rinse your body with warm water from the neck down.  8.  DO NOT shower/wash with your normal soap after using and rinsing off       the CHG Soap.  9.  Pat yourself  dry with a clean towel.            10.  Wear clean pajamas.            11.  Place clean sheets on your bed the night of your first shower and do not        sleep with pets.  Day of Surgery  Do not apply any lotions/deodorants the morning of surgery.  Please wear clean clothes to the hospital.    Please read over the fact sheets that you were given.

## 2016-11-03 ENCOUNTER — Encounter (HOSPITAL_COMMUNITY)
Admission: RE | Admit: 2016-11-03 | Discharge: 2016-11-03 | Disposition: A | Discharge status: Home / Self Care | Payer: PPO | Source: Ambulatory Visit | Attending: Neurosurgery | Admitting: Neurosurgery

## 2016-11-03 ENCOUNTER — Encounter (HOSPITAL_COMMUNITY): Payer: Self-pay

## 2016-11-03 DIAGNOSIS — Z01812 Encounter for preprocedural laboratory examination: Secondary | ICD-10-CM | POA: Diagnosis not present

## 2016-11-03 DIAGNOSIS — Z79899 Other long term (current) drug therapy: Secondary | ICD-10-CM | POA: Insufficient documentation

## 2016-11-03 DIAGNOSIS — K219 Gastro-esophageal reflux disease without esophagitis: Secondary | ICD-10-CM | POA: Insufficient documentation

## 2016-11-03 DIAGNOSIS — Z86711 Personal history of pulmonary embolism: Secondary | ICD-10-CM | POA: Insufficient documentation

## 2016-11-03 DIAGNOSIS — Z7982 Long term (current) use of aspirin: Secondary | ICD-10-CM | POA: Diagnosis not present

## 2016-11-03 DIAGNOSIS — Z01810 Encounter for preprocedural cardiovascular examination: Secondary | ICD-10-CM | POA: Diagnosis not present

## 2016-11-03 DIAGNOSIS — Z8673 Personal history of transient ischemic attack (TIA), and cerebral infarction without residual deficits: Secondary | ICD-10-CM | POA: Insufficient documentation

## 2016-11-03 DIAGNOSIS — I10 Essential (primary) hypertension: Secondary | ICD-10-CM | POA: Diagnosis not present

## 2016-11-03 DIAGNOSIS — Z862 Personal history of diseases of the blood and blood-forming organs and certain disorders involving the immune mechanism: Secondary | ICD-10-CM | POA: Insufficient documentation

## 2016-11-03 DIAGNOSIS — M4316 Spondylolisthesis, lumbar region: Secondary | ICD-10-CM | POA: Diagnosis not present

## 2016-11-03 DIAGNOSIS — D649 Anemia, unspecified: Secondary | ICD-10-CM | POA: Insufficient documentation

## 2016-11-03 HISTORY — DX: Personal history of other diseases of the digestive system: Z87.19

## 2016-11-03 HISTORY — DX: Unspecified osteoarthritis, unspecified site: M19.90

## 2016-11-03 HISTORY — DX: Cerebral infarction, unspecified: I63.9

## 2016-11-03 HISTORY — DX: Restless legs syndrome: G25.81

## 2016-11-03 HISTORY — DX: Pneumonia, unspecified organism: J18.9

## 2016-11-03 HISTORY — DX: Gastro-esophageal reflux disease without esophagitis: K21.9

## 2016-11-03 HISTORY — DX: Bell's palsy: G51.0

## 2016-11-03 HISTORY — DX: Anemia, unspecified: D64.9

## 2016-11-03 LAB — BASIC METABOLIC PANEL
ANION GAP: 9 (ref 5–15)
BUN: 20 mg/dL (ref 6–20)
CHLORIDE: 99 mmol/L — AB (ref 101–111)
CO2: 31 mmol/L (ref 22–32)
Calcium: 10.4 mg/dL — ABNORMAL HIGH (ref 8.9–10.3)
Creatinine, Ser: 1 mg/dL (ref 0.44–1.00)
GFR calc non Af Amer: 53 mL/min — ABNORMAL LOW (ref >60)
GLUCOSE: 100 mg/dL — AB (ref 65–99)
Potassium: 3.8 mmol/L (ref 3.5–5.1)
Sodium: 139 mmol/L (ref 135–145)

## 2016-11-03 LAB — CBC
HCT: 35.9 % — ABNORMAL LOW (ref 36.0–46.0)
Hemoglobin: 11.2 g/dL — ABNORMAL LOW (ref 12.0–15.0)
MCH: 26 pg (ref 26.0–34.0)
MCHC: 31.2 g/dL (ref 30.0–36.0)
MCV: 83.3 fL (ref 78.0–100.0)
PLATELETS: 212 10*3/uL (ref 150–400)
RBC: 4.31 MIL/uL (ref 3.87–5.11)
RDW: 16 % — AB (ref 11.5–15.5)
WBC: 4.4 10*3/uL (ref 4.0–10.5)

## 2016-11-03 LAB — TYPE AND SCREEN
ABO/RH(D): O POS
Antibody Screen: NEGATIVE

## 2016-11-03 LAB — SURGICAL PCR SCREEN
MRSA, PCR: NEGATIVE
Staphylococcus aureus: NEGATIVE

## 2016-11-03 LAB — ABO/RH: ABO/RH(D): O POS

## 2016-11-03 NOTE — Progress Notes (Signed)
Pt denies cardiac history, chest pain or sob. Hx of DVT and PE "years ago" per pt. Pt has hx of haemochromatosis, but has not had to have any blood drawn off for a few years. Pt was instructed to stop Aspirin on 11/05/16. Pt states her blood pressure is usually in the 130's/70's range. Today on arrival pt's blood pressure was 176/78. Had it rechecked after visit and 184/105. Had pt sit in lobby and relax, blood pressure was checked about 20 min later and it was 177/78. Instructed pt to check blood pressure at home and if it continues to be up, to see her PCP. She voiced understanding.

## 2016-11-04 NOTE — Progress Notes (Signed)
Anesthesia Chart Review:  Pt is a 76 year old female scheduled for L4-5, L5-S1 PLIF, interbody prosthesis, posterior lateral arthrodesis, posterior segmental instrumentation on 11/12/2016 with Newman Pies, M.D.  - PCP is Shirline Frees, MD - Hematologist is Burney Gauze, MD who is aware of upcoming surgery  PMH includes: HTN, hemochromatosis, chronic DVT of RLE, PE (has prothrombin II gene mutation), stroke (finding on MRI), anemia, GERD. Never smoker. BMI 36.  Medications include: Amlodipine, ASA 81, HCTZ, Protonix, propranolol  BP (!) 177/78   Pulse (!) 56   Temp 36.5 C (Oral)   Resp 18   Ht 5' 2.25" (1.581 m)   Wt 182 lb 12.2 oz (82.9 kg)   SpO2 100%   BMI 33.16 kg/m    BP initially 176/78. On recheck, it was 184/105 and 177/78. Pt reports taking her medication as prescribed.  Pt reports usual BP ~130's/70's. Pt instructed by PAT RN to monitor BP at home and if it remains high, to see PCP prior to surgery. I left voicemail for [facility] in Dr. Arnoldo Morale' office about elevated BP.   Preoperative labs reviewed.   CXR 05/19/16: No acute abnormality.  Moderate-sized hiatal hernia.  EKG 11/03/16: Sinus bradycardia (55 bpm). Septal infarct, age undetermined  If BP acceptable DOS, I anticipate pt can proceed as scheduled.   Willeen Cass, FNP-BC [facility]/Anesthesiology Phone: [phone] 11/04/2016 3:38 PM

## 2016-11-12 ENCOUNTER — Inpatient Hospital Stay (HOSPITAL_COMMUNITY)
Admission: RE | Admit: 2016-11-12 | Discharge: 2016-11-18 | DRG: 460 | Disposition: A | Discharge status: Rehabilitation Facility | Payer: PPO | Source: Ambulatory Visit | Attending: Neurosurgery | Admitting: Neurosurgery

## 2016-11-12 ENCOUNTER — Inpatient Hospital Stay (HOSPITAL_COMMUNITY): Payer: PPO

## 2016-11-12 ENCOUNTER — Inpatient Hospital Stay (HOSPITAL_COMMUNITY): Payer: PPO | Admitting: Emergency Medicine

## 2016-11-12 ENCOUNTER — Encounter (HOSPITAL_COMMUNITY): Payer: Self-pay | Admitting: Certified Registered"

## 2016-11-12 ENCOUNTER — Encounter (HOSPITAL_COMMUNITY)
Admission: RE | Disposition: A | Discharge status: Rehabilitation Facility | Payer: Self-pay | Source: Ambulatory Visit | Attending: Neurosurgery

## 2016-11-12 DIAGNOSIS — M549 Dorsalgia, unspecified: Secondary | ICD-10-CM | POA: Diagnosis present

## 2016-11-12 DIAGNOSIS — M792 Neuralgia and neuritis, unspecified: Secondary | ICD-10-CM | POA: Diagnosis not present

## 2016-11-12 DIAGNOSIS — E876 Hypokalemia: Secondary | ICD-10-CM | POA: Diagnosis not present

## 2016-11-12 DIAGNOSIS — Z882 Allergy status to sulfonamides status: Secondary | ICD-10-CM

## 2016-11-12 DIAGNOSIS — M4726 Other spondylosis with radiculopathy, lumbar region: Secondary | ICD-10-CM | POA: Diagnosis not present

## 2016-11-12 DIAGNOSIS — G2581 Restless legs syndrome: Secondary | ICD-10-CM | POA: Diagnosis present

## 2016-11-12 DIAGNOSIS — Z86711 Personal history of pulmonary embolism: Secondary | ICD-10-CM | POA: Diagnosis not present

## 2016-11-12 DIAGNOSIS — M21379 Foot drop, unspecified foot: Secondary | ICD-10-CM | POA: Diagnosis not present

## 2016-11-12 DIAGNOSIS — G250 Essential tremor: Secondary | ICD-10-CM | POA: Diagnosis not present

## 2016-11-12 DIAGNOSIS — N390 Urinary tract infection, site not specified: Secondary | ICD-10-CM | POA: Diagnosis not present

## 2016-11-12 DIAGNOSIS — Z7982 Long term (current) use of aspirin: Secondary | ICD-10-CM | POA: Diagnosis not present

## 2016-11-12 DIAGNOSIS — I10 Essential (primary) hypertension: Secondary | ICD-10-CM | POA: Diagnosis present

## 2016-11-12 DIAGNOSIS — M4185 Other forms of scoliosis, thoracolumbar region: Secondary | ICD-10-CM | POA: Diagnosis not present

## 2016-11-12 DIAGNOSIS — M4807 Spinal stenosis, lumbosacral region: Principal | ICD-10-CM | POA: Diagnosis present

## 2016-11-12 DIAGNOSIS — M47816 Spondylosis without myelopathy or radiculopathy, lumbar region: Secondary | ICD-10-CM | POA: Diagnosis not present

## 2016-11-12 DIAGNOSIS — M4326 Fusion of spine, lumbar region: Secondary | ICD-10-CM | POA: Diagnosis not present

## 2016-11-12 DIAGNOSIS — Z885 Allergy status to narcotic agent status: Secondary | ICD-10-CM | POA: Diagnosis not present

## 2016-11-12 DIAGNOSIS — M21372 Foot drop, left foot: Secondary | ICD-10-CM | POA: Diagnosis not present

## 2016-11-12 DIAGNOSIS — E46 Unspecified protein-calorie malnutrition: Secondary | ICD-10-CM | POA: Diagnosis not present

## 2016-11-12 DIAGNOSIS — M4316 Spondylolisthesis, lumbar region: Secondary | ICD-10-CM | POA: Diagnosis not present

## 2016-11-12 DIAGNOSIS — R339 Retention of urine, unspecified: Secondary | ICD-10-CM | POA: Diagnosis not present

## 2016-11-12 DIAGNOSIS — B9620 Unspecified Escherichia coli [E. coli] as the cause of diseases classified elsewhere: Secondary | ICD-10-CM | POA: Diagnosis not present

## 2016-11-12 DIAGNOSIS — R200 Anesthesia of skin: Secondary | ICD-10-CM | POA: Diagnosis not present

## 2016-11-12 DIAGNOSIS — G629 Polyneuropathy, unspecified: Secondary | ICD-10-CM | POA: Diagnosis not present

## 2016-11-12 DIAGNOSIS — K5900 Constipation, unspecified: Secondary | ICD-10-CM | POA: Diagnosis present

## 2016-11-12 DIAGNOSIS — T84296A Other mechanical complication of internal fixation device of vertebrae, initial encounter: Secondary | ICD-10-CM | POA: Diagnosis not present

## 2016-11-12 DIAGNOSIS — T8131XA Disruption of external operation (surgical) wound, not elsewhere classified, initial encounter: Secondary | ICD-10-CM | POA: Diagnosis not present

## 2016-11-12 DIAGNOSIS — M48062 Spinal stenosis, lumbar region with neurogenic claudication: Secondary | ICD-10-CM | POA: Diagnosis not present

## 2016-11-12 DIAGNOSIS — I82401 Acute embolism and thrombosis of unspecified deep veins of right lower extremity: Secondary | ICD-10-CM | POA: Diagnosis not present

## 2016-11-12 DIAGNOSIS — Z880 Allergy status to penicillin: Secondary | ICD-10-CM

## 2016-11-12 DIAGNOSIS — Z6833 Body mass index (BMI) 33.0-33.9, adult: Secondary | ICD-10-CM | POA: Diagnosis not present

## 2016-11-12 DIAGNOSIS — K219 Gastro-esophageal reflux disease without esophagitis: Secondary | ICD-10-CM | POA: Diagnosis present

## 2016-11-12 DIAGNOSIS — Z419 Encounter for procedure for purposes other than remedying health state, unspecified: Secondary | ICD-10-CM

## 2016-11-12 DIAGNOSIS — Z8673 Personal history of transient ischemic attack (TIA), and cerebral infarction without residual deficits: Secondary | ICD-10-CM

## 2016-11-12 DIAGNOSIS — Z86718 Personal history of other venous thrombosis and embolism: Secondary | ICD-10-CM | POA: Diagnosis not present

## 2016-11-12 DIAGNOSIS — M5416 Radiculopathy, lumbar region: Secondary | ICD-10-CM

## 2016-11-12 DIAGNOSIS — D6852 Prothrombin gene mutation: Secondary | ICD-10-CM | POA: Diagnosis not present

## 2016-11-12 DIAGNOSIS — T887XXA Unspecified adverse effect of drug or medicament, initial encounter: Secondary | ICD-10-CM | POA: Diagnosis not present

## 2016-11-12 DIAGNOSIS — Z79899 Other long term (current) drug therapy: Secondary | ICD-10-CM | POA: Diagnosis not present

## 2016-11-12 DIAGNOSIS — R338 Other retention of urine: Secondary | ICD-10-CM | POA: Diagnosis not present

## 2016-11-12 DIAGNOSIS — R8290 Unspecified abnormal findings in urine: Secondary | ICD-10-CM | POA: Diagnosis not present

## 2016-11-12 DIAGNOSIS — R8299 Other abnormal findings in urine: Secondary | ICD-10-CM | POA: Diagnosis not present

## 2016-11-12 DIAGNOSIS — I82511 Chronic embolism and thrombosis of right femoral vein: Secondary | ICD-10-CM | POA: Diagnosis not present

## 2016-11-12 DIAGNOSIS — R609 Edema, unspecified: Secondary | ICD-10-CM | POA: Diagnosis not present

## 2016-11-12 DIAGNOSIS — N9989 Other postprocedural complications and disorders of genitourinary system: Secondary | ICD-10-CM | POA: Diagnosis not present

## 2016-11-12 DIAGNOSIS — D62 Acute posthemorrhagic anemia: Secondary | ICD-10-CM | POA: Diagnosis not present

## 2016-11-12 DIAGNOSIS — Z4789 Encounter for other orthopedic aftercare: Secondary | ICD-10-CM | POA: Diagnosis not present

## 2016-11-12 SURGERY — POSTERIOR LUMBAR FUSION 2 LEVEL
Anesthesia: General | Site: Back

## 2016-11-12 MED ORDER — POLYVINYL ALCOHOL 1.4 % OP SOLN
1.0000 [drp] | OPHTHALMIC | Status: DC | PRN
  Filled 2016-11-12: qty 15

## 2016-11-12 MED ORDER — HEMOSTATIC AGENTS (NO CHARGE) OPTIME
TOPICAL | Status: DC | PRN
  Administered 2016-11-12: 1 via TOPICAL

## 2016-11-12 MED ORDER — MORPHINE SULFATE (PF) 4 MG/ML IV SOLN
4.0000 mg | INTRAVENOUS | Status: DC | PRN
  Administered 2016-11-12: 2 mg via INTRAVENOUS
  Administered 2016-11-12 – 2016-11-15 (×2): 4 mg via INTRAVENOUS
  Filled 2016-11-12 (×2): qty 1

## 2016-11-12 MED ORDER — PROPOFOL 10 MG/ML IV BOLUS
INTRAVENOUS | Status: DC | PRN
  Administered 2016-11-12: 120 mg via INTRAVENOUS
  Administered 2016-11-12: 50 mg via INTRAVENOUS

## 2016-11-12 MED ORDER — PROPRANOLOL HCL 10 MG PO TABS
10.0000 mg | ORAL_TABLET | Freq: Two times a day (BID) | ORAL | Status: DC
  Administered 2016-11-12 – 2016-11-18 (×12): 10 mg via ORAL
  Filled 2016-11-12 (×13): qty 1

## 2016-11-12 MED ORDER — AMLODIPINE BESYLATE 5 MG PO TABS
5.0000 mg | ORAL_TABLET | Freq: Every day | ORAL | Status: DC
  Administered 2016-11-14 – 2016-11-18 (×5): 5 mg via ORAL
  Filled 2016-11-12 (×6): qty 1

## 2016-11-12 MED ORDER — OXYCODONE HCL 5 MG PO TABS
5.0000 mg | ORAL_TABLET | ORAL | Status: DC | PRN
  Administered 2016-11-12 – 2016-11-13 (×2): 10 mg via ORAL
  Administered 2016-11-13 – 2016-11-14 (×6): 5 mg via ORAL
  Administered 2016-11-14 (×2): 10 mg via ORAL
  Administered 2016-11-14: 5 mg via ORAL
  Administered 2016-11-15: 10 mg via ORAL
  Administered 2016-11-15 (×2): 5 mg via ORAL
  Administered 2016-11-15 – 2016-11-17 (×10): 10 mg via ORAL
  Filled 2016-11-12 (×5): qty 2
  Filled 2016-11-12 (×3): qty 1
  Filled 2016-11-12 (×3): qty 2
  Filled 2016-11-12: qty 1
  Filled 2016-11-12: qty 2
  Filled 2016-11-12 (×4): qty 1
  Filled 2016-11-12: qty 2
  Filled 2016-11-12: qty 1
  Filled 2016-11-12 (×4): qty 2

## 2016-11-12 MED ORDER — MENTHOL 3 MG MT LOZG: 1.0000 | LOZENGE | OROMUCOSAL | Status: DC | PRN

## 2016-11-12 MED ORDER — SUGAMMADEX SODIUM 200 MG/2ML IV SOLN
INTRAVENOUS | Status: AC
  Filled 2016-11-12: qty 2

## 2016-11-12 MED ORDER — 0.9 % SODIUM CHLORIDE (POUR BTL) OPTIME
TOPICAL | Status: DC | PRN
  Administered 2016-11-12: 1000 mL

## 2016-11-12 MED ORDER — PHENYLEPHRINE HCL 10 MG/ML IJ SOLN
INTRAVENOUS | Status: DC | PRN
  Administered 2016-11-12: 10 ug/min via INTRAVENOUS

## 2016-11-12 MED ORDER — LIDOCAINE HCL (CARDIAC) 20 MG/ML IV SOLN
INTRAVENOUS | Status: AC
  Filled 2016-11-12: qty 5

## 2016-11-12 MED ORDER — ROCURONIUM BROMIDE 50 MG/5ML IV SOLN
INTRAVENOUS | Status: AC
  Filled 2016-11-12: qty 1

## 2016-11-12 MED ORDER — OXYCODONE-ACETAMINOPHEN 5-325 MG PO TABS
ORAL_TABLET | ORAL | Status: AC
  Filled 2016-11-12: qty 2

## 2016-11-12 MED ORDER — FENTANYL CITRATE (PF) 250 MCG/5ML IJ SOLN
INTRAMUSCULAR | Status: AC
  Filled 2016-11-12: qty 5

## 2016-11-12 MED ORDER — THROMBIN 20000 UNITS EX SOLR
CUTANEOUS | Status: AC
  Filled 2016-11-12: qty 20000

## 2016-11-12 MED ORDER — VANCOMYCIN HCL 1000 MG IV SOLR
INTRAVENOUS | Status: DC | PRN
  Administered 2016-11-12: 1000 mg via TOPICAL

## 2016-11-12 MED ORDER — PROPOFOL 10 MG/ML IV BOLUS
INTRAVENOUS | Status: AC
  Filled 2016-11-12: qty 20

## 2016-11-12 MED ORDER — ACETAMINOPHEN 325 MG PO TABS
650.0000 mg | ORAL_TABLET | ORAL | Status: DC | PRN
  Administered 2016-11-18: 650 mg via ORAL
  Filled 2016-11-12: qty 2

## 2016-11-12 MED ORDER — THROMBIN 5000 UNITS EX SOLR
OROMUCOSAL | Status: DC | PRN
  Administered 2016-11-12: 5 mL via TOPICAL

## 2016-11-12 MED ORDER — BACITRACIN ZINC 500 UNIT/GM EX OINT
TOPICAL_OINTMENT | CUTANEOUS | Status: AC
  Filled 2016-11-12: qty 28.35

## 2016-11-12 MED ORDER — CHLORHEXIDINE GLUCONATE CLOTH 2 % EX PADS: 6.0000 | MEDICATED_PAD | Freq: Once | CUTANEOUS | Status: DC

## 2016-11-12 MED ORDER — ACETAMINOPHEN 650 MG RE SUPP: 650.0000 mg | RECTAL | Status: DC | PRN

## 2016-11-12 MED ORDER — TEMAZEPAM 15 MG PO CAPS: 15.0000 mg | ORAL_CAPSULE | Freq: Every evening | ORAL | Status: DC | PRN

## 2016-11-12 MED ORDER — PRAMIPEXOLE DIHYDROCHLORIDE 0.25 MG PO TABS
0.5000 mg | ORAL_TABLET | Freq: Every evening | ORAL | Status: DC
  Administered 2016-11-12 – 2016-11-17 (×6): 0.5 mg via ORAL
  Filled 2016-11-12 (×6): qty 2

## 2016-11-12 MED ORDER — MORPHINE SULFATE (PF) 4 MG/ML IV SOLN
INTRAVENOUS | Status: AC
  Administered 2016-11-12: 2 mg via INTRAVENOUS
  Filled 2016-11-12: qty 1

## 2016-11-12 MED ORDER — HYDROMORPHONE HCL 1 MG/ML IJ SOLN
INTRAMUSCULAR | Status: AC
  Filled 2016-11-12: qty 0.5

## 2016-11-12 MED ORDER — THROMBIN 20000 UNITS EX SOLR
CUTANEOUS | Status: DC | PRN
  Administered 2016-11-12: 20 mL via TOPICAL

## 2016-11-12 MED ORDER — HYDROMORPHONE HCL 1 MG/ML IJ SOLN
INTRAMUSCULAR | Status: AC
  Filled 2016-11-12: qty 1

## 2016-11-12 MED ORDER — DOCUSATE SODIUM 100 MG PO CAPS
100.0000 mg | ORAL_CAPSULE | Freq: Two times a day (BID) | ORAL | Status: DC
  Administered 2016-11-12 – 2016-11-18 (×12): 100 mg via ORAL
  Filled 2016-11-12 (×12): qty 1

## 2016-11-12 MED ORDER — ADULT MULTIVITAMIN W/MINERALS CH
1.0000 | ORAL_TABLET | Freq: Every day | ORAL | Status: DC
  Administered 2016-11-13 – 2016-11-18 (×6): 1 via ORAL
  Filled 2016-11-12 (×6): qty 1

## 2016-11-12 MED ORDER — SODIUM CHLORIDE 0.9% FLUSH: 3.0000 mL | INTRAVENOUS | Status: DC | PRN

## 2016-11-12 MED ORDER — CITALOPRAM HYDROBROMIDE 10 MG PO TABS
20.0000 mg | ORAL_TABLET | Freq: Every day | ORAL | Status: DC
  Administered 2016-11-12 – 2016-11-17 (×6): 20 mg via ORAL
  Filled 2016-11-12 (×6): qty 2

## 2016-11-12 MED ORDER — ONDANSETRON HCL 4 MG/2ML IJ SOLN
INTRAMUSCULAR | Status: DC | PRN
  Administered 2016-11-12: 4 mg via INTRAVENOUS

## 2016-11-12 MED ORDER — ONDANSETRON HCL 4 MG/2ML IJ SOLN
INTRAMUSCULAR | Status: AC
  Filled 2016-11-12: qty 2

## 2016-11-12 MED ORDER — CHLORHEXIDINE GLUCONATE CLOTH 2 % EX PADS
6.0000 | MEDICATED_PAD | Freq: Once | CUTANEOUS | Status: DC
Start: 2016-11-12 — End: 2016-11-12

## 2016-11-12 MED ORDER — LIDOCAINE 2% (20 MG/ML) 5 ML SYRINGE
INTRAMUSCULAR | Status: DC | PRN
  Administered 2016-11-12: 40 mg via INTRAVENOUS

## 2016-11-12 MED ORDER — CYCLOBENZAPRINE HCL 10 MG PO TABS
10.0000 mg | ORAL_TABLET | Freq: Three times a day (TID) | ORAL | Status: DC | PRN
  Administered 2016-11-12 – 2016-11-17 (×9): 10 mg via ORAL
  Filled 2016-11-12 (×10): qty 1

## 2016-11-12 MED ORDER — DEXAMETHASONE SODIUM PHOSPHATE 10 MG/ML IJ SOLN
INTRAMUSCULAR | Status: AC
  Filled 2016-11-12: qty 1

## 2016-11-12 MED ORDER — PROMETHAZINE HCL 25 MG/ML IJ SOLN
6.2500 mg | INTRAMUSCULAR | Status: DC | PRN
Start: 2016-11-12 — End: 2016-11-12

## 2016-11-12 MED ORDER — BACITRACIN ZINC 500 UNIT/GM EX OINT
TOPICAL_OINTMENT | CUTANEOUS | Status: DC | PRN
  Administered 2016-11-12: 1 via TOPICAL

## 2016-11-12 MED ORDER — DEXAMETHASONE SODIUM PHOSPHATE 10 MG/ML IJ SOLN
INTRAMUSCULAR | Status: DC | PRN
  Administered 2016-11-12: 10 mg via INTRAVENOUS

## 2016-11-12 MED ORDER — SODIUM CHLORIDE 0.9 % IV SOLN
250.0000 mL | INTRAVENOUS | Status: DC
  Administered 2016-11-13: 14:00:00 via INTRAVENOUS

## 2016-11-12 MED ORDER — SODIUM CHLORIDE 0.9% FLUSH
3.0000 mL | Freq: Two times a day (BID) | INTRAVENOUS | Status: DC
  Administered 2016-11-12 – 2016-11-17 (×6): 3 mL via INTRAVENOUS

## 2016-11-12 MED ORDER — SUGAMMADEX SODIUM 200 MG/2ML IV SOLN
INTRAVENOUS | Status: DC | PRN
  Administered 2016-11-12: 160 mg via INTRAVENOUS

## 2016-11-12 MED ORDER — LACTATED RINGERS IV SOLN
INTRAVENOUS | Status: DC
  Administered 2016-11-12 (×3): via INTRAVENOUS

## 2016-11-12 MED ORDER — ONDANSETRON HCL 4 MG PO TABS: 4.0000 mg | ORAL_TABLET | Freq: Four times a day (QID) | ORAL | Status: DC | PRN

## 2016-11-12 MED ORDER — THROMBIN 5000 UNITS EX SOLR
CUTANEOUS | Status: AC
  Filled 2016-11-12: qty 5000

## 2016-11-12 MED ORDER — PANTOPRAZOLE SODIUM 40 MG PO TBEC
40.0000 mg | DELAYED_RELEASE_TABLET | Freq: Every day | ORAL | Status: DC
  Administered 2016-11-14 – 2016-11-17 (×4): 40 mg via ORAL
  Filled 2016-11-12 (×4): qty 1

## 2016-11-12 MED ORDER — BACITRACIN 50000 UNITS IM SOLR
INTRAMUSCULAR | Status: DC | PRN
  Administered 2016-11-12: 500 mL

## 2016-11-12 MED ORDER — GLYCOPYRROLATE 0.2 MG/ML IV SOSY
PREFILLED_SYRINGE | INTRAVENOUS | Status: DC | PRN
  Administered 2016-11-12 (×2): .1 mg via INTRAVENOUS

## 2016-11-12 MED ORDER — ONDANSETRON HCL 4 MG/2ML IJ SOLN: 4.0000 mg | Freq: Four times a day (QID) | INTRAMUSCULAR | Status: DC | PRN

## 2016-11-12 MED ORDER — FENTANYL CITRATE (PF) 100 MCG/2ML IJ SOLN
INTRAMUSCULAR | Status: DC | PRN
Start: 2016-11-12 — End: 2016-11-12
  Administered 2016-11-12 (×2): 25 ug via INTRAVENOUS
  Administered 2016-11-12: 50 ug via INTRAVENOUS
  Administered 2016-11-12: 25 ug via INTRAVENOUS
  Administered 2016-11-12: 50 ug via INTRAVENOUS
  Administered 2016-11-12 (×3): 25 ug via INTRAVENOUS

## 2016-11-12 MED ORDER — BUPIVACAINE-EPINEPHRINE (PF) 0.5% -1:200000 IJ SOLN
INTRAMUSCULAR | Status: AC
  Filled 2016-11-12: qty 30

## 2016-11-12 MED ORDER — PHENOL 1.4 % MT LIQD: 1.0000 | OROMUCOSAL | Status: DC | PRN

## 2016-11-12 MED ORDER — VANCOMYCIN HCL 1000 MG IV SOLR
INTRAVENOUS | Status: AC
  Filled 2016-11-12: qty 1000

## 2016-11-12 MED ORDER — OXYCODONE HCL 5 MG PO TABS
ORAL_TABLET | ORAL | Status: AC
  Filled 2016-11-12: qty 2

## 2016-11-12 MED ORDER — BISACODYL 10 MG RE SUPP: 10.0000 mg | Freq: Every day | RECTAL | Status: DC | PRN

## 2016-11-12 MED ORDER — COLCHICINE 0.6 MG PO TABS
0.6000 mg | ORAL_TABLET | Freq: Every day | ORAL | Status: DC
  Administered 2016-11-13 – 2016-11-18 (×6): 0.6 mg via ORAL
  Filled 2016-11-12 (×6): qty 1

## 2016-11-12 MED ORDER — HYDROMORPHONE HCL 1 MG/ML IJ SOLN
0.2500 mg | INTRAMUSCULAR | Status: DC | PRN
  Administered 2016-11-12 (×4): 0.5 mg via INTRAVENOUS

## 2016-11-12 MED ORDER — HYDROCHLOROTHIAZIDE 25 MG PO TABS
25.0000 mg | ORAL_TABLET | Freq: Every day | ORAL | Status: DC
  Administered 2016-11-14 – 2016-11-18 (×5): 25 mg via ORAL
  Filled 2016-11-12 (×6): qty 1

## 2016-11-12 MED ORDER — VANCOMYCIN HCL IN DEXTROSE 1-5 GM/200ML-% IV SOLN
1000.0000 mg | INTRAVENOUS | Status: AC
  Administered 2016-11-12: 1000 mg via INTRAVENOUS
  Filled 2016-11-12: qty 200

## 2016-11-12 MED ORDER — FENTANYL CITRATE (PF) 100 MCG/2ML IJ SOLN: 25.0000 ug | INTRAMUSCULAR | Status: DC | PRN

## 2016-11-12 MED ORDER — ROCURONIUM BROMIDE 10 MG/ML (PF) SYRINGE
PREFILLED_SYRINGE | INTRAVENOUS | Status: DC | PRN
  Administered 2016-11-12 (×3): 10 mg via INTRAVENOUS
  Administered 2016-11-12: 40 mg via INTRAVENOUS
  Administered 2016-11-12 (×2): 10 mg via INTRAVENOUS

## 2016-11-12 MED ORDER — VANCOMYCIN HCL IN DEXTROSE 1-5 GM/200ML-% IV SOLN
1000.0000 mg | Freq: Once | INTRAVENOUS | Status: AC
  Administered 2016-11-13: 1000 mg via INTRAVENOUS
  Filled 2016-11-12: qty 200

## 2016-11-12 MED ORDER — ONDANSETRON HCL 4 MG/2ML IJ SOLN: 4.0000 mg | Freq: Once | INTRAMUSCULAR | Status: DC | PRN

## 2016-11-12 MED ORDER — MIDAZOLAM HCL 2 MG/2ML IJ SOLN
INTRAMUSCULAR | Status: AC
  Filled 2016-11-12: qty 2

## 2016-11-12 MED ORDER — CYCLOSPORINE 0.05 % OP EMUL
1.0000 [drp] | Freq: Two times a day (BID) | OPHTHALMIC | Status: DC
  Administered 2016-11-12 – 2016-11-18 (×12): 1 [drp] via OPHTHALMIC
  Filled 2016-11-12 (×12): qty 1

## 2016-11-12 SURGICAL SUPPLY — 65 items
BAG DECANTER FOR FLEXI CONT (MISCELLANEOUS) ×2 IMPLANT
BENZOIN TINCTURE PRP APPL 2/3 (GAUZE/BANDAGES/DRESSINGS) ×2 IMPLANT
BLADE CLIPPER SURG (BLADE) IMPLANT
BUR MATCHSTICK NEURO 3.0 LAGG (BURR) ×2 IMPLANT
BUR PRECISION FLUTE 6.0 (BURR) ×2 IMPLANT
CANISTER SUCT 3000ML PPV (MISCELLANEOUS) ×2 IMPLANT
CAP REVERE LOCKING (Cap) ×12 IMPLANT
CARTRIDGE OIL MAESTRO DRILL (MISCELLANEOUS) ×1 IMPLANT
CONT SPEC 4OZ CLIKSEAL STRL BL (MISCELLANEOUS) ×2 IMPLANT
COVER BACK TABLE 60X90IN (DRAPES) ×4 IMPLANT
DIFFUSER DRILL AIR PNEUMATIC (MISCELLANEOUS) ×2 IMPLANT
DRAPE C-ARM 42X72 X-RAY (DRAPES) ×4 IMPLANT
DRAPE HALF SHEET 40X57 (DRAPES) ×2 IMPLANT
DRAPE LAPAROTOMY 100X72X124 (DRAPES) ×2 IMPLANT
DRAPE POUCH INSTRU U-SHP 10X18 (DRAPES) ×2 IMPLANT
DRAPE SURG 17X23 STRL (DRAPES) ×8 IMPLANT
ELECT BLADE 4.0 EZ CLEAN MEGAD (MISCELLANEOUS) ×4
ELECT REM PT RETURN 9FT ADLT (ELECTROSURGICAL) ×2
ELECTRODE BLDE 4.0 EZ CLN MEGD (MISCELLANEOUS) ×2 IMPLANT
ELECTRODE REM PT RTRN 9FT ADLT (ELECTROSURGICAL) ×1 IMPLANT
GAUZE SPONGE 4X4 12PLY STRL (GAUZE/BANDAGES/DRESSINGS) ×2 IMPLANT
GAUZE SPONGE 4X4 16PLY XRAY LF (GAUZE/BANDAGES/DRESSINGS) ×2 IMPLANT
GLOVE BIO SURGEON STRL SZ8 (GLOVE) ×6 IMPLANT
GLOVE BIO SURGEON STRL SZ8.5 (GLOVE) ×6 IMPLANT
GLOVE EXAM NITRILE LRG STRL (GLOVE) IMPLANT
GLOVE EXAM NITRILE XL STR (GLOVE) IMPLANT
GLOVE EXAM NITRILE XS STR PU (GLOVE) IMPLANT
GOWN STRL REUS W/ TWL LRG LVL3 (GOWN DISPOSABLE) ×1 IMPLANT
GOWN STRL REUS W/ TWL XL LVL3 (GOWN DISPOSABLE) ×3 IMPLANT
GOWN STRL REUS W/TWL 2XL LVL3 (GOWN DISPOSABLE) IMPLANT
GOWN STRL REUS W/TWL LRG LVL3 (GOWN DISPOSABLE) ×1
GOWN STRL REUS W/TWL XL LVL3 (GOWN DISPOSABLE) ×3
KIT BASIN OR (CUSTOM PROCEDURE TRAY) ×2 IMPLANT
KIT ROOM TURNOVER OR (KITS) ×2 IMPLANT
NEEDLE HYPO 21X1.5 SAFETY (NEEDLE) IMPLANT
NEEDLE HYPO 22GX1.5 SAFETY (NEEDLE) ×2 IMPLANT
NS IRRIG 1000ML POUR BTL (IV SOLUTION) ×2 IMPLANT
OIL CARTRIDGE MAESTRO DRILL (MISCELLANEOUS) ×2
PACK LAMINECTOMY NEURO (CUSTOM PROCEDURE TRAY) ×2 IMPLANT
PAD ARMBOARD 7.5X6 YLW CONV (MISCELLANEOUS) ×6 IMPLANT
PATTIES SURGICAL .25X.25 (GAUZE/BANDAGES/DRESSINGS) ×2 IMPLANT
PATTIES SURGICAL .5 X.5 (GAUZE/BANDAGES/DRESSINGS) ×2 IMPLANT
PATTIES SURGICAL .5 X1 (DISPOSABLE) IMPLANT
PATTIES SURGICAL .5X1.5 (GAUZE/BANDAGES/DRESSINGS) ×2 IMPLANT
PENCIL BUTTON HOLSTER BLD 10FT (ELECTRODE) ×2 IMPLANT
ROD REVERE 6.35 CURVED 55MM (Rod) ×4 IMPLANT
SCREW REVERE 6.35 6.5MMX45 (Screw) ×6 IMPLANT
SCREW REVERE 6.35 6.5X40MM (Screw) ×2 IMPLANT
SCREW REVERE 6.5X50MM (Screw) ×4 IMPLANT
SEALANT ADHERUS EXTEND TIP (MISCELLANEOUS) ×2 IMPLANT
SPONGE LAP 4X18 X RAY DECT (DISPOSABLE) IMPLANT
SPONGE NEURO XRAY DETECT 1X3 (DISPOSABLE) IMPLANT
SPONGE SURGIFOAM ABS GEL 100 (HEMOSTASIS) ×2 IMPLANT
STRIP BIOACTIVE 20CC 25X100X8 (Miscellaneous) ×2 IMPLANT
STRIP CLOSURE SKIN 1/2X4 (GAUZE/BANDAGES/DRESSINGS) ×2 IMPLANT
SUT ETHILON 2 0 PSLX (SUTURE) ×4 IMPLANT
SUT PROLENE 6 0 BV (SUTURE) ×12 IMPLANT
SUT VIC AB 1 CT1 18XBRD ANBCTR (SUTURE) ×3 IMPLANT
SUT VIC AB 1 CT1 8-18 (SUTURE) ×3
SUT VIC AB 2-0 CP2 18 (SUTURE) ×4 IMPLANT
TAPE CLOTH SURG 4X10 WHT LF (GAUZE/BANDAGES/DRESSINGS) ×2 IMPLANT
TOWEL GREEN STERILE (TOWEL DISPOSABLE) ×2 IMPLANT
TOWEL GREEN STERILE FF (TOWEL DISPOSABLE) ×2 IMPLANT
TRAY FOLEY W/METER SILVER 16FR (SET/KITS/TRAYS/PACK) ×2 IMPLANT
WATER STERILE IRR 1000ML POUR (IV SOLUTION) ×2 IMPLANT

## 2016-11-12 NOTE — H&P (Signed)
Subjective: The patient is a 76 year old white female who has complained of back and leg pain consistent with neurogenic claudication. She has failed medical management and was worked up with a lumbar myelo CT. This demonstrated the patient had a spondylolisthesis and spinal stenosis most prominent at L4-5 and L5-S1. I discussed the various treatment options with the patient. She has decided to proceed with surgery.   Past Medical History:  Diagnosis Date  . Anemia   . Arthritis   . Bell's palsy   . GERD (gastroesophageal reflux disease)   . Hemochromatosis   . History of hiatal hernia   . Hypertension   . PE (pulmonary embolism)   . Pneumonia    "walking" pneumonia  . Prothrombin gene mutation ([place]) 05/30/2013  . Restless legs   . Right leg DVT ([place]) 05/30/2013  . Stroke [facility])    found on a MRI, she's not aware otherwise    Past Surgical History:  Procedure Laterality Date  . BACK SURGERY    . COLONOSCOPY    . SHOULDER SURGERY Bilateral     Allergies  Allergen Reactions  . Penicillins Anaphylaxis    Pt was 38 or 76 years old  Has patient had a PCN reaction causing immediate rash, facial/tongue/throat swelling, SOB or lightheadedness with hypotension: Yes Has patient had a PCN reaction causing severe rash involving mucus membranes or skin necrosis: Unknown Has patient had a PCN reaction that required hospitalization: No Has patient had a PCN reaction occurring within the last 10 years: No If all of the above answers are "NO", then may proceed with Cephalosporin use.   . Sulfa Antibiotics Other (See Comments)    unknown  . Morphine And Related Other (See Comments)    Twitching in large doses     Social History  Substance Use Topics  . Smoking status: Never Smoker  . Smokeless tobacco: Never Used     Comment: never used tobacco  . Alcohol use No    Family History  Problem Relation Age of Onset  . Congestive Heart Failure Mother   . Pancreatic cancer Father    Prior to  Admission medications   Medication Sig Start Date End Date Taking? Authorizing Provider  amLODipine (NORVASC) 5 MG tablet Take 5 mg by mouth daily.  07/23/11  Yes [provider]  aspirin EC 81 MG tablet Take 162 mg by mouth daily.   Yes [provider]  Biotin 5000 MCG TABS Take 5,000 mcg by mouth daily.    Yes [provider]  Black Cohosh (REMIFEMIN PO) Take 1 tablet by mouth 2 (two) times daily.   Yes [provider]  Calcium Carbonate-Vitamin D (CALCIUM 600+D PO) Take 1 tablet by mouth 2 (two) times daily.   Yes [provider]  Cholecalciferol (VITAMIN D) 2000 UNITS tablet Take 2,000 Units by mouth 2 (two) times daily.   Yes [provider]  citalopram (CELEXA) 20 MG tablet Take 20 mg by mouth at bedtime.  08/05/11  Yes [provider]  colchicine 0.6 MG tablet Take 1 tablet by mouth daily as needed. Gout flare up. 06/24/15  Yes [provider]  cyclobenzaprine (FLEXERIL) 10 MG tablet Take 1 tablet (10 mg total) by mouth 2 (two) times daily as needed for muscle spasms. 07/18/15  Yes Montine Circle, PA-C  fish oil-omega-3 fatty acids 1000 MG capsule Take 1 g by mouth daily.    Yes [provider]  hydrochlorothiazide (HYDRODIURIL) 25 MG tablet Take 25 mg  by mouth daily.   Yes [provider]  Hypromellose (ARTIFICIAL TEARS OP) Place 1 drop into both eyes 3 (three) times daily as needed (dry eyes).   Yes [provider]  Multiple Vitamin (MULTIVITAMIN WITH MINERALS) TABS tablet Take 1 tablet by mouth daily.   Yes [provider]  oxyCODONE-acetaminophen (PERCOCET/ROXICET) 5-325 MG tablet Take 1 tablet by mouth 3 (three) times daily as needed for moderate pain.  07/04/16  Yes [provider]  pantoprazole (PROTONIX) 40 MG tablet Take 40 mg by mouth daily before supper.   Yes [provider]  pramipexole (MIRAPEX) 0.5 MG tablet Take 0.5 mg by mouth every evening.   Yes [provider]  propranolol (INDERAL) 10 MG tablet Take 10 mg by mouth 2 (two) times daily.  07/23/11  Yes [provider]  RESTASIS 0.05 % ophthalmic emulsion Place 1 drop into both eyes 2 (two) times daily. 09/16/16  Yes [provider]  temazepam (RESTORIL) 15 MG capsule Take 2 capsules (30 mg total) by mouth at bedtime. Patient taking differently: Take 15 mg by mouth at bedtime.  06/22/14  Yes Volanda Napoleon, MD  Turmeric Curcumin 500 MG CAPS Take 500 mg by mouth at bedtime.    Yes [provider]  vitamin E 400 UNIT capsule Take 400 Units by mouth daily.   Yes [provider]  HYDROcodone-acetaminophen (NORCO/VICODIN) 5-325 MG tablet Take 1 tablet by mouth every 6 (six) hours as needed. Patient not taking: Reported on 08/27/2016 07/18/15   Montine Circle, PA-C  methocarbamol (ROBAXIN) 500 MG tablet Take 1 tablet (500 mg total) by mouth 2 (two) times daily. Patient not taking: Reported on 10/30/2016 07/20/15   Nona Dell, PA-C     Review of Systems  Positive ROS: As above  All other systems have been reviewed and were otherwise negative with the exception of those mentioned in the HPI and as above.  Objective: Vital signs in last 24 hours: Temp:  [98 F (36.7 C)] 98 F (36.7 C) (07/18 1059) Pulse Rate:  [55] 55 (07/18 1059) Resp:  [18] 18 (07/18 1059) BP: (165)/(62) 165/62 (07/18 1059) SpO2:  [98 %] 98 % (07/18 1059) Weight:  [82.6 kg (182 lb)] 82.6 kg (182 lb) (07/18 1059)  General Appearance: Alert Head: Normocephalic, without obvious abnormality, atraumatic Eyes: PERRL, conjunctiva/corneas clear, EOM's intact,    Ears: Normal  Throat: Normal  Neck: Supple, Back: unremarkable Lungs: Clear to auscultation bilaterally, respirations unlabored Heart: Regular rate and rhythm, no murmur, rub or gallop Abdomen: Soft, non-tender Extremities: Extremities normal, atraumatic, no cyanosis or edema Skin: unremarkable  NEUROLOGIC:    Mental status: alert and oriented,Motor Exam - grossly normal Sensory Exam - grossly normal Reflexes:  Coordination - grossly normal Gait - grossly normal Balance - grossly normal Cranial Nerves: I: smell Not tested  II: visual acuity  OS: Normal  OD: Normal   II: visual fields Full to confrontation  II: pupils Equal, round, reactive to light  III,VII: ptosis None  III,IV,VI: extraocular muscles  Full ROM  V: mastication Normal  V: facial light touch sensation  Normal  V,VII: corneal reflex  Present  VII: facial muscle function - upper  Normal  VII: facial muscle function - lower Normal  VIII: hearing Not tested  IX: soft palate elevation  Normal  IX,X: gag reflex Present  XI: trapezius strength  5/5  XI: sternocleidomastoid strength 5/5  XI: neck flexion strength  5/5  XII: tongue strength  Normal    Data Review Lab Results  Component Value Date   WBC 4.4 11/03/2016   HGB 11.2 (L) 11/03/2016   HCT 35.9 (L) 11/03/2016   MCV 83.3 11/03/2016   PLT 212 11/03/2016   Lab Results  Component Value Date   NA 139 11/03/2016   K 3.8 11/03/2016   CL 99 (L) 11/03/2016   CO2 31 11/03/2016   BUN 20 11/03/2016   CREATININE 1.00 11/03/2016   GLUCOSE 100 (H) 11/03/2016   Lab Results  Component Value Date   INR 1.4 (L) 01/18/2009   PROTIME 16.8 (H) 01/18/2009    Assessment/Plan: L4-5 and L5-S1 spondylolisthesis, spinal stenosis, lumbago, lumbar radiculopathy, neurogenic claudication: I have discussed the situation with the patient and reviewed her imaging studies with her. We have discussed the various treatment options including surgery. I have described the surgical treatment option of an L4-5 and L5-S1 decompression, instrumentation, and fusion. I have shown her surgical models. We have discussed the risks, benefits, alternatives, expected postoperative course, and likelihood of achieving her goals for surgery. I have answered all the patient's questions. She has decided to  proceed with surgery.   Maleta Pacha D 11/12/2016 12:08 PM

## 2016-11-12 NOTE — Anesthesia Procedure Notes (Signed)
Procedure Name: Intubation Date/Time: 11/12/2016 12:44 PM Performed by: Sampson Si E Pre-anesthesia Checklist: Patient identified, Emergency Drugs available, Suction available and Patient being monitored Patient Re-evaluated:Patient Re-evaluated prior to induction Oxygen Delivery Method: Circle System Utilized Preoxygenation: Pre-oxygenation with 100% oxygen Induction Type: IV induction Ventilation: Mask ventilation without difficulty Laryngoscope Size: Mac and 3 Grade View: Grade I Tube type: Oral Number of attempts: 1 Airway Equipment and Method: Stylet and Oral airway Placement Confirmation: ETT inserted through vocal cords under direct vision,  positive ETCO2 and breath sounds checked- equal and bilateral Secured at: 20 cm Tube secured with: Tape Dental Injury: Teeth and Oropharynx as per pre-operative assessment

## 2016-11-12 NOTE — Progress Notes (Signed)
Pharmacy Antibiotic Note  [name] is a 76 y.o. female admitted on 11/12/2016 with back and leg pain.  Pharmacy has been consulted for vancomycin dosing for post surgical prophylaxis.  No surgical drain present, will order one time dose post-op.  Plan: Vancomycin 1g IV x1 dose tonight Pharmacy to sign off  Weight: 182 lb (82.6 kg)  Temp (24hrs), Avg:97.9 F (36.6 C), Min:97.5 F (36.4 C), Max:98.1 F (36.7 C)  No results for input(s): WBC, CREATININE, LATICACIDVEN, VANCOTROUGH, VANCOPEAK, VANCORANDOM, GENTTROUGH, GENTPEAK, GENTRANDOM, TOBRATROUGH, TOBRAPEAK, TOBRARND, AMIKACINPEAK, AMIKACINTROU, AMIKACIN in the last 168 hours.  Estimated Creatinine Clearance: 48 mL/min (by C-G formula based on SCr of 1 mg/dL).    Allergies  Allergen Reactions  . Penicillins Anaphylaxis    Pt was 58 or 76 years old  Has patient had a PCN reaction causing immediate rash, facial/tongue/throat swelling, SOB or lightheadedness with hypotension: Yes Has patient had a PCN reaction causing severe rash involving mucus membranes or skin necrosis: Unknown Has patient had a PCN reaction that required hospitalization: No Has patient had a PCN reaction occurring within the last 10 years: No If all of the above answers are "NO", then may proceed with Cephalosporin use.   . Sulfa Antibiotics Other (See Comments)    unknown  . Morphine And Related Other (See Comments)    Twitching in large doses     Thank you for allowing pharmacy to be a part of this patient's care.  Erin Hearing PharmD., BCPS Clinical Pharmacist Pager [phone] 11/12/2016 9:54 PM

## 2016-11-12 NOTE — Anesthesia Postprocedure Evaluation (Signed)
Anesthesia Post Note  Patient: [name]  Procedure(s) Performed: Procedure(s) (LRB): POSTERIOR LUMBAR INTERBODY FUSION, INTERBODY PROSTHESIS, POSTERIOR LATERAL ARTHRODESIS,POSTERIOR SEGMENTAL INSTRUMENTATION LUMBAR FOUR  LUMBAR FIVE , LUMBAR FIVE - SACRAL ONE (N/A)     Patient location during evaluation: PACU Anesthesia Type: General Level of consciousness: awake Pain management: pain level controlled Vital Signs Assessment: post-procedure vital signs reviewed and stable Respiratory status: spontaneous breathing Cardiovascular status: stable Anesthetic complications: no    Last Vitals:  Vitals:   11/12/16 1915 11/12/16 1930  BP: 140/74 (!) 141/71  Pulse: 76 81  Resp: 11 15  Temp:      Last Pain:  Vitals:   11/12/16 1930  TempSrc:   PainSc: 4                  Haru Anspaugh

## 2016-11-12 NOTE — Progress Notes (Signed)
Subjective:  The patient is alert and pleasant. She denies headaches. Her back is appropriately sore.  Objective: Vital signs in last 24 hours: Temp:  [98 F (36.7 C)] 98 F (36.7 C) (07/18 1059) Pulse Rate:  [55] 55 (07/18 1059) Resp:  [18] 18 (07/18 1059) BP: (165)/(62) 165/62 (07/18 1059) SpO2:  [98 %] 98 % (07/18 1059) Weight:  [82.6 kg (182 lb)] 82.6 kg (182 lb) (07/18 1059)  Intake/Output from previous day: No intake/output data recorded. Intake/Output this shift: Total I/O In: 2000 [I.V.:2000] Out: 850 [Urine:775; Blood:75]  Physical exam the patient is alert and oriented. Her strength is grossly normal on a bowel gastrocnemius and dorsiflexors.  Lab Results: No results for input(s): WBC, HGB, HCT, PLT in the last 72 hours. BMET No results for input(s): NA, K, CL, CO2, GLUCOSE, BUN, CREATININE, CALCIUM in the last 72 hours.  Studies/Results: Dg Lumbar Spine 2-3 Views  Result Date: 11/12/2016 CLINICAL DATA:  L4-S1 PLIF EXAM: LUMBAR SPINE - 2-3 VIEW; DG C-ARM 61-120 MIN COMPARISON:  Radiography from earlier today FINDINGS: AP and lateral intraprocedural fluoroscopic images show placement of L4, L5, and S1 screws. There has been laminectomy at the operative levels. Chronic L4-5 anterolisthesis. No unexpected finding. IMPRESSION: Fluoroscopy for L4-S1 posterior fusion. Electronically Signed   By: Monte Fantasia M.D.   On: 11/12/2016 16:36   Dg Lumbar Spine 1 View  Result Date: 11/12/2016 CLINICAL DATA:  Elective surgery.  L4-5 and L5-S1 PLIF. EXAM: LUMBAR SPINE - 1 VIEW COMPARISON:  09/04/2016 lumbar myelogram FINDINGS: Spinal numbering as on preoperative myelogram. Retractors present at the level of the L4 and L5 spinous processes. A probe is present at the L5-S1 interspinous space. Degenerative changes and listhesis as described previously. IMPRESSION: Retractors present at the L4 and L5 spinous processes. Probe projects closest to the L5-S1 interspinous space. Electronically  Signed   By: Monte Fantasia M.D.   On: 11/12/2016 14:44   Dg C-arm 1-60 Min  Result Date: 11/12/2016 CLINICAL DATA:  L4-S1 PLIF EXAM: LUMBAR SPINE - 2-3 VIEW; DG C-ARM 61-120 MIN COMPARISON:  Radiography from earlier today FINDINGS: AP and lateral intraprocedural fluoroscopic images show placement of L4, L5, and S1 screws. There has been laminectomy at the operative levels. Chronic L4-5 anterolisthesis. No unexpected finding. IMPRESSION: Fluoroscopy for L4-S1 posterior fusion. Electronically Signed   By: Monte Fantasia M.D.   On: 11/12/2016 16:36    Assessment/Plan: The patient is doing well. We will keep the head of bed less than 20 and begin to mobilize her tomorrow.  LOS: 0 days     Kiely Cousar D 11/12/2016, 6:15 PM

## 2016-11-12 NOTE — Anesthesia Postprocedure Evaluation (Signed)
Anesthesia Post Note  Patient: [name]  Procedure(s) Performed: Procedure(s) (LRB): POSTERIOR LUMBAR INTERBODY FUSION, INTERBODY PROSTHESIS, POSTERIOR LATERAL ARTHRODESIS,POSTERIOR SEGMENTAL INSTRUMENTATION LUMBAR FOUR  LUMBAR FIVE , LUMBAR FIVE - SACRAL ONE (N/A)     Patient location during evaluation: PACU Anesthesia Type: General Level of consciousness: awake, awake and alert and oriented Pain management: pain level controlled Vital Signs Assessment: post-procedure vital signs reviewed and stable Respiratory status: spontaneous breathing, nonlabored ventilation and respiratory function stable Cardiovascular status: blood pressure returned to baseline Anesthetic complications: no    Last Vitals:  Vitals:   11/12/16 1915 11/12/16 1930  BP: 140/74 (!) 141/71  Pulse: 76 81  Resp: 11 15  Temp:      Last Pain:  Vitals:   11/12/16 1930  TempSrc:   PainSc: 4                  Trampas Stettner COKER

## 2016-11-12 NOTE — Anesthesia Preprocedure Evaluation (Signed)
Anesthesia Evaluation  Patient identified by MRN, date of birth, ID band Patient awake    Reviewed: Allergy & Precautions, NPO status , Patient's Chart, lab work & pertinent test results  Airway Mallampati: II  TM Distance: >3 FB Neck ROM: Full    Dental no notable dental hx.    Pulmonary PE   Pulmonary exam normal breath sounds clear to auscultation       Cardiovascular hypertension, + DVT  Normal cardiovascular exam Rhythm:Regular Rate:Normal     Neuro/Psych negative neurological ROS  negative psych ROS   GI/Hepatic Neg liver ROS, GERD  ,  Endo/Other  negative endocrine ROS  Renal/GU negative Renal ROS  negative genitourinary   Musculoskeletal negative musculoskeletal ROS (+)   Abdominal   Peds negative pediatric ROS (+)  Hematology  (+) anemia ,   Anesthesia Other Findings   Reproductive/Obstetrics negative OB ROS                             Anesthesia Physical Anesthesia Plan  ASA: III  Anesthesia Plan: General   Post-op Pain Management:    Induction: Intravenous  PONV Risk Score and Plan: 1 and Ondansetron and Dexamethasone  Airway Management Planned: Oral ETT  Additional Equipment:   Intra-op Plan:   Post-operative Plan: Extubation in OR  Informed Consent: I have reviewed the patients History and Physical, chart, labs and discussed the procedure including the risks, benefits and alternatives for the proposed anesthesia with the patient or authorized representative who has indicated his/her understanding and acceptance.   Dental advisory given  Plan Discussed with: CRNA and Surgeon  Anesthesia Plan Comments:         Anesthesia Quick Evaluation

## 2016-11-12 NOTE — Transfer of Care (Signed)
Immediate Anesthesia Transfer of Care Note  Patient: [name] Section  Procedure(s) Performed: Procedure(s): POSTERIOR LUMBAR INTERBODY FUSION, INTERBODY PROSTHESIS, POSTERIOR LATERAL ARTHRODESIS,POSTERIOR SEGMENTAL INSTRUMENTATION LUMBAR FOUR  LUMBAR FIVE , LUMBAR FIVE - SACRAL ONE (N/A)  Patient Location: PACU  Anesthesia Type:General  Level of Consciousness: drowsy and patient cooperative  Airway & Oxygen Therapy: Patient Spontanous Breathing and Patient connected to nasal cannula oxygen  Post-op Assessment: Report given to RN and Patient moving all extremities X 4  Post vital signs: Reviewed and stable  Last Vitals:  Vitals:   11/12/16 1059  BP: (!) 165/62  Pulse: (!) 55  Resp: 18  Temp: 36.7 C    Last Pain:  Vitals:   11/12/16 1059  TempSrc: Oral         Complications: No apparent anesthesia complications

## 2016-11-12 NOTE — Op Note (Signed)
Brief history: The patient is a 76 year old white female who's had prior back surgeries. She has developed recurrent intractable right leg pain. She failed medical management and was worked up with a lumbar myelo CT. This demonstrated the patient had a thoracic lumbar scoliosis, lumbar and lumbosacral spondylolisthesis and spinal stenosis. I discussed the situation with the patient and her husband. We discussed the various treatment options. She has weighed the risks, benefits, and alternatives to surgery and decided to proceed with an L4-5 and L5-S1 decompression, instrumentation, and fusion.  Preoperative diagnosis: L4-5 and L5-S1 spondylolisthesis, facet arthropathy, recurrent spinal stenosis compressing the bilateral L4, L5 and S1 nerve roots; lumbago; lumbar radiculopathy; neurogenic claudication; thoracolumbar scoliosis  Postoperative diagnosis: The same  Procedure: Redo L4-5 and L5-S1 laminectomy/foraminotomies to decompress the bilateral L4, L5 and S1 nerve roots(the work required to do this was in addition to the work required to do the posterior lumbarfusion because of the patient's recurrent spinal stenosis, facet arthropathy. Etc. requiring a wide decompression of the nerve roots.); posterior segmental instrumentation from L4 to S1 with globus titanium pedicle screws and rods; posterior lateral arthrodesis at L4-5 and L5-S1 with local morselized autograft bone and Kinnex bone graft extender.  Surgeon: Dr. Earle Gell  Asst.: Dr. Kary Kos  Anesthesia: Gen. endotracheal  Estimated blood loss: 500 mL  Drains: None  Complications: Durotomy  Description of procedure: The patient was brought to the operating room by the anesthesia team. General endotracheal anesthesia was induced. The patient was turned to the prone position on the Wilson frame. The patient's lumbosacral region was then prepared with Betadine scrub and Betadine solution. Sterile drapes were applied.  I then injected the  area to be incised with Marcaine with epinephrine solution. I then used the scalpel to make a linear midline incision over the L4-5 and L5-S1 interspace, incising through the old surgical incision. I then used electrocautery to perform a bilateral subperiosteal dissection exposing the remainder of the lamina at L4, L5 and the upper sacrum. We then obtained intraoperative radiograph to confirm our location. We then inserted the Verstrac retractor to provide exposure.  I began the decompression by using the high speed drill to extend the patient's prior laminectomy laterally until we encountered some relatively non-scarred dura. We then used the Kerrison punches to widen the laminotomy and removed the epidural scar tissue and residual ligamentum flavum at L4-5 and L5-S1. We used the Kerrison punches to remove the medial facets at L4-5 and L5-S1 facets were quite arthritic. We performed wide foraminotomies about the bilateral L4, L5 and S1 nerve roots completing the decompression. We decreased a durotomy at near the takeoff of the right L5 nerve root. We closed this with interrupted 6-0 Prolene sutures. We had a second durotomy at the right posterior thecal sac at L4. The dura was extremely thin and we could not get the sutures to reapproximate this durotomy very well.  We now turned attention to the instrumentation. Under fluoroscopic guidance we cannulated the bilateral L4, L5 and S1 pedicles with the bone probe. We then removed the bone probe. We then tapped the pedicle with a 5.5 millimeter tap. We then removed the tap. We probed inside the tapped pedicle with a ball probe to rule out cortical breaches. We then inserted a 6.5 x 40, 45 and 50 millimeter pedicle screw into the L4, L5 and S1 pedicles bilaterally under fluoroscopic guidance. We then palpated along the medial aspect of the pedicles to rule out cortical breaches. There were none. The  nerve roots were not injured. We then connected the unilateral  pedicle screws with a lordotic rod. We then tightened the caps appropriately. This completed the instrumentation from L4-S1 bilaterally.  We now turned our attention to the posterior lateral arthrodesis at L4-5 and L5-S1 bilaterally. We used the high-speed drill to decorticate the remainder of the facets, pars, transverse process at L4-5 and L5-S1 bilaterally. We then applied a combination of local morselized autograft bone and Kinnex bone graft extender over these decorticated posterior lateral structures. This completed the posterior lateral arthrodesis.  We then obtained hemostasis using bipolar electrocautery. We irrigated the wound out with bacitracin solution. We inspected the thecal sac and nerve roots and noted they were well decompressed. We placed Adheris over the durotomy sites. We then removed the retractor. We placed vancomycin powder in the wound. We reapproximated patient's thoracolumbar fascia with interrupted #1 Vicryl suture. We reapproximated patient's subcutaneous tissue with interrupted 2-0 Vicryl suture. The reapproximated patient's skin with a running 2-0 nylon suture. The wound was then coated with bacitracin ointment. A sterile dressing was applied. The drapes were removed. The patient was subsequently returned to the supine position where they were extubated by the anesthesia team. He was then transported to the post anesthesia care unit in stable condition. All sponge instrument and needle counts were reportedly correct at the end of this case.

## 2016-11-13 ENCOUNTER — Inpatient Hospital Stay (HOSPITAL_COMMUNITY): Payer: PPO

## 2016-11-13 ENCOUNTER — Encounter (HOSPITAL_COMMUNITY): Payer: Self-pay | Admitting: Certified Registered"

## 2016-11-13 ENCOUNTER — Inpatient Hospital Stay (HOSPITAL_COMMUNITY): Payer: PPO | Admitting: Certified Registered"

## 2016-11-13 ENCOUNTER — Encounter (HOSPITAL_COMMUNITY)
Admission: RE | Disposition: A | Discharge status: Rehabilitation Facility | Payer: Self-pay | Source: Ambulatory Visit | Attending: Neurosurgery

## 2016-11-13 LAB — CBC
HEMATOCRIT: 28.2 % — AB (ref 36.0–46.0)
HEMOGLOBIN: 8.9 g/dL — AB (ref 12.0–15.0)
MCH: 26.1 pg (ref 26.0–34.0)
MCHC: 31.6 g/dL (ref 30.0–36.0)
MCV: 82.7 fL (ref 78.0–100.0)
Platelets: 163 10*3/uL (ref 150–400)
RBC: 3.41 MIL/uL — ABNORMAL LOW (ref 3.87–5.11)
RDW: 15.9 % — ABNORMAL HIGH (ref 11.5–15.5)
WBC: 8 10*3/uL (ref 4.0–10.5)

## 2016-11-13 LAB — BASIC METABOLIC PANEL
ANION GAP: 6 (ref 5–15)
BUN: 16 mg/dL (ref 6–20)
CHLORIDE: 96 mmol/L — AB (ref 101–111)
CO2: 33 mmol/L — AB (ref 22–32)
Calcium: 8.5 mg/dL — ABNORMAL LOW (ref 8.9–10.3)
Creatinine, Ser: 0.92 mg/dL (ref 0.44–1.00)
GFR calc non Af Amer: 59 mL/min — ABNORMAL LOW (ref >60)
GLUCOSE: 127 mg/dL — AB (ref 65–99)
Potassium: 3.4 mmol/L — ABNORMAL LOW (ref 3.5–5.1)
Sodium: 135 mmol/L (ref 135–145)

## 2016-11-13 SURGERY — POSTERIOR LUMBAR FUSION 1 LEVEL
Anesthesia: General | Site: Back

## 2016-11-13 MED ORDER — LIDOCAINE HCL (CARDIAC) 20 MG/ML IV SOLN
INTRAVENOUS | Status: AC
  Filled 2016-11-13: qty 5

## 2016-11-13 MED ORDER — DEXAMETHASONE SODIUM PHOSPHATE 10 MG/ML IJ SOLN
INTRAMUSCULAR | Status: DC | PRN
  Administered 2016-11-13: 5 mg via INTRAVENOUS

## 2016-11-13 MED ORDER — VANCOMYCIN HCL IN DEXTROSE 1-5 GM/200ML-% IV SOLN
INTRAVENOUS | Status: AC
  Filled 2016-11-13: qty 200

## 2016-11-13 MED ORDER — DEXAMETHASONE SODIUM PHOSPHATE 4 MG/ML IJ SOLN
4.0000 mg | Freq: Four times a day (QID) | INTRAMUSCULAR | Status: AC
  Administered 2016-11-13 – 2016-11-14 (×6): 4 mg via INTRAVENOUS
  Filled 2016-11-13 (×6): qty 1

## 2016-11-13 MED ORDER — THROMBIN 20000 UNITS EX SOLR
CUTANEOUS | Status: AC
  Filled 2016-11-13: qty 20000

## 2016-11-13 MED ORDER — SODIUM CHLORIDE 0.9 % IJ SOLN
INTRAMUSCULAR | Status: AC
  Filled 2016-11-13: qty 10

## 2016-11-13 MED ORDER — HYDROMORPHONE HCL 1 MG/ML IJ SOLN: 0.2500 mg | INTRAMUSCULAR | Status: DC | PRN

## 2016-11-13 MED ORDER — VANCOMYCIN HCL 1000 MG IV SOLR
INTRAVENOUS | Status: DC | PRN
  Administered 2016-11-13: 1000 mg via INTRAVENOUS

## 2016-11-13 MED ORDER — DEXAMETHASONE SODIUM PHOSPHATE 10 MG/ML IJ SOLN
INTRAMUSCULAR | Status: AC
  Filled 2016-11-13: qty 1

## 2016-11-13 MED ORDER — SUFENTANIL CITRATE 50 MCG/ML IV SOLN
INTRAVENOUS | Status: AC
  Filled 2016-11-13: qty 1

## 2016-11-13 MED ORDER — BUPIVACAINE HCL (PF) 0.5 % IJ SOLN
INTRAMUSCULAR | Status: AC
  Filled 2016-11-13: qty 30

## 2016-11-13 MED ORDER — MIDAZOLAM HCL 2 MG/2ML IJ SOLN
INTRAMUSCULAR | Status: AC
  Filled 2016-11-13: qty 2

## 2016-11-13 MED ORDER — PROPOFOL 10 MG/ML IV BOLUS
INTRAVENOUS | Status: DC | PRN
  Administered 2016-11-13: 125 mg via INTRAVENOUS

## 2016-11-13 MED ORDER — VANCOMYCIN HCL 1000 MG IV SOLR
INTRAVENOUS | Status: AC
  Filled 2016-11-13: qty 1000

## 2016-11-13 MED ORDER — ROCURONIUM BROMIDE 50 MG/5ML IV SOLN
INTRAVENOUS | Status: AC
  Filled 2016-11-13: qty 1

## 2016-11-13 MED ORDER — MIDAZOLAM HCL 2 MG/2ML IJ SOLN: 0.5000 mg | Freq: Once | INTRAMUSCULAR | Status: DC | PRN

## 2016-11-13 MED ORDER — ONDANSETRON HCL 4 MG/2ML IJ SOLN
INTRAMUSCULAR | Status: AC
  Filled 2016-11-13: qty 2

## 2016-11-13 MED ORDER — PROPOFOL 10 MG/ML IV BOLUS
INTRAVENOUS | Status: AC
  Filled 2016-11-13: qty 20

## 2016-11-13 MED ORDER — BACITRACIN ZINC 500 UNIT/GM EX OINT
TOPICAL_OINTMENT | CUTANEOUS | Status: DC | PRN
  Administered 2016-11-13: 1 via TOPICAL

## 2016-11-13 MED ORDER — SUGAMMADEX SODIUM 200 MG/2ML IV SOLN
INTRAVENOUS | Status: DC | PRN
  Administered 2016-11-13: 50 mg via INTRAVENOUS

## 2016-11-13 MED ORDER — THROMBIN 20000 UNITS EX SOLR
CUTANEOUS | Status: DC | PRN
  Administered 2016-11-13: 20 mL via TOPICAL

## 2016-11-13 MED ORDER — MEPERIDINE HCL 25 MG/ML IJ SOLN: 6.2500 mg | INTRAMUSCULAR | Status: DC | PRN

## 2016-11-13 MED ORDER — SUFENTANIL CITRATE 50 MCG/ML IV SOLN
INTRAVENOUS | Status: DC | PRN
  Administered 2016-11-13: 10 ug via INTRAVENOUS
  Administered 2016-11-13: 5 ug via INTRAVENOUS

## 2016-11-13 MED ORDER — VANCOMYCIN HCL 1000 MG IV SOLR
INTRAVENOUS | Status: DC | PRN
  Administered 2016-11-13: 1000 mg via TOPICAL

## 2016-11-13 MED ORDER — THROMBIN 5000 UNITS EX SOLR
OROMUCOSAL | Status: DC | PRN
  Administered 2016-11-13: 5 mL via TOPICAL

## 2016-11-13 MED ORDER — LIDOCAINE 2% (20 MG/ML) 5 ML SYRINGE
INTRAMUSCULAR | Status: DC | PRN
  Administered 2016-11-13: 30 mg via INTRAVENOUS

## 2016-11-13 MED ORDER — PROMETHAZINE HCL 25 MG/ML IJ SOLN: 6.2500 mg | INTRAMUSCULAR | Status: DC | PRN

## 2016-11-13 MED ORDER — ROCURONIUM BROMIDE 10 MG/ML (PF) SYRINGE
PREFILLED_SYRINGE | INTRAVENOUS | Status: DC | PRN
  Administered 2016-11-13: 50 mg via INTRAVENOUS

## 2016-11-13 MED ORDER — MIDAZOLAM HCL 5 MG/5ML IJ SOLN
INTRAMUSCULAR | Status: DC | PRN
  Administered 2016-11-13: 1 mg via INTRAVENOUS

## 2016-11-13 MED ORDER — BACITRACIN ZINC 500 UNIT/GM EX OINT
TOPICAL_OINTMENT | CUTANEOUS | Status: AC
  Filled 2016-11-13: qty 28.35

## 2016-11-13 MED ORDER — ONDANSETRON HCL 4 MG/2ML IJ SOLN
INTRAMUSCULAR | Status: DC | PRN
  Administered 2016-11-13: 4 mg via INTRAVENOUS

## 2016-11-13 MED ORDER — THROMBIN 5000 UNITS EX SOLR
CUTANEOUS | Status: AC
  Filled 2016-11-13: qty 5000

## 2016-11-13 MED ORDER — BACITRACIN 50000 UNITS IM SOLR
INTRAMUSCULAR | Status: DC | PRN
  Administered 2016-11-13: 500 mL

## 2016-11-13 MED FILL — Sodium Chloride IV Soln 0.9%: INTRAVENOUS | Qty: 1000 | Status: AC

## 2016-11-13 MED FILL — Heparin Sodium (Porcine) Inj 1000 Unit/ML: INTRAMUSCULAR | Qty: 30 | Status: AC

## 2016-11-13 SURGICAL SUPPLY — 58 items
BAG DECANTER FOR FLEXI CONT (MISCELLANEOUS) ×2 IMPLANT
BENZOIN TINCTURE PRP APPL 2/3 (GAUZE/BANDAGES/DRESSINGS) ×2 IMPLANT
BLADE CLIPPER SURG (BLADE) IMPLANT
BUR MATCHSTICK NEURO 3.0 LAGG (BURR) ×4 IMPLANT
BUR PRECISION FLUTE 6.0 (BURR) ×2 IMPLANT
CANISTER SUCT 3000ML PPV (MISCELLANEOUS) ×2 IMPLANT
CAP REVERE LOCKING (Cap) ×6 IMPLANT
CARTRIDGE OIL MAESTRO DRILL (MISCELLANEOUS) ×1 IMPLANT
CONT SPEC 4OZ CLIKSEAL STRL BL (MISCELLANEOUS) ×2 IMPLANT
COVER BACK TABLE 60X90IN (DRAPES) ×4 IMPLANT
DIFFUSER DRILL AIR PNEUMATIC (MISCELLANEOUS) ×2 IMPLANT
DRAPE C-ARM 42X72 X-RAY (DRAPES) ×4 IMPLANT
DRAPE HALF SHEET 40X57 (DRAPES) ×2 IMPLANT
DRAPE LAPAROTOMY 100X72X124 (DRAPES) ×2 IMPLANT
DRAPE POUCH INSTRU U-SHP 10X18 (DRAPES) ×2 IMPLANT
DRAPE SURG 17X23 STRL (DRAPES) ×8 IMPLANT
ELECT BLADE 4.0 EZ CLEAN MEGAD (MISCELLANEOUS) ×2
ELECT REM PT RETURN 9FT ADLT (ELECTROSURGICAL) ×2
ELECTRODE BLDE 4.0 EZ CLN MEGD (MISCELLANEOUS) ×1 IMPLANT
ELECTRODE REM PT RTRN 9FT ADLT (ELECTROSURGICAL) ×1 IMPLANT
EVACUATOR 1/8 PVC DRAIN (DRAIN) IMPLANT
GAUZE SPONGE 4X4 12PLY STRL (GAUZE/BANDAGES/DRESSINGS) ×2 IMPLANT
GAUZE SPONGE 4X4 16PLY XRAY LF (GAUZE/BANDAGES/DRESSINGS) ×2 IMPLANT
GLOVE BIO SURGEON STRL SZ 6.5 (GLOVE) ×2 IMPLANT
GLOVE BIO SURGEON STRL SZ8 (GLOVE) ×4 IMPLANT
GLOVE BIO SURGEON STRL SZ8.5 (GLOVE) ×4 IMPLANT
GLOVE BIOGEL PI IND STRL 6.5 (GLOVE) ×1 IMPLANT
GLOVE BIOGEL PI INDICATOR 6.5 (GLOVE) ×1
GLOVE EXAM NITRILE LRG STRL (GLOVE) IMPLANT
GLOVE EXAM NITRILE XL STR (GLOVE) IMPLANT
GLOVE EXAM NITRILE XS STR PU (GLOVE) IMPLANT
GOWN STRL REUS W/ TWL LRG LVL3 (GOWN DISPOSABLE) IMPLANT
GOWN STRL REUS W/ TWL XL LVL3 (GOWN DISPOSABLE) ×2 IMPLANT
GOWN STRL REUS W/TWL 2XL LVL3 (GOWN DISPOSABLE) IMPLANT
GOWN STRL REUS W/TWL LRG LVL3 (GOWN DISPOSABLE)
GOWN STRL REUS W/TWL XL LVL3 (GOWN DISPOSABLE) ×2
KIT BASIN OR (CUSTOM PROCEDURE TRAY) ×2 IMPLANT
KIT ROOM TURNOVER OR (KITS) ×2 IMPLANT
NEEDLE HYPO 21X1.5 SAFETY (NEEDLE) IMPLANT
NEEDLE HYPO 22GX1.5 SAFETY (NEEDLE) ×2 IMPLANT
NS IRRIG 1000ML POUR BTL (IV SOLUTION) ×2 IMPLANT
OIL CARTRIDGE MAESTRO DRILL (MISCELLANEOUS) ×2
PACK LAMINECTOMY NEURO (CUSTOM PROCEDURE TRAY) ×2 IMPLANT
PAD ARMBOARD 7.5X6 YLW CONV (MISCELLANEOUS) ×6 IMPLANT
PATTIES SURGICAL .5 X1 (DISPOSABLE) IMPLANT
SPONGE LAP 4X18 X RAY DECT (DISPOSABLE) IMPLANT
SPONGE NEURO XRAY DETECT 1X3 (DISPOSABLE) IMPLANT
SPONGE SURGIFOAM ABS GEL 100 (HEMOSTASIS) ×2 IMPLANT
STRIP CLOSURE SKIN 1/2X4 (GAUZE/BANDAGES/DRESSINGS) ×2 IMPLANT
SUT ETHILON 2 0 PSLX (SUTURE) ×4 IMPLANT
SUT VIC AB 1 CT1 18XBRD ANBCTR (SUTURE) ×4 IMPLANT
SUT VIC AB 1 CT1 8-18 (SUTURE) ×4
SUT VIC AB 2-0 CP2 18 (SUTURE) ×4 IMPLANT
TAPE CLOTH SURG 4X10 WHT LF (GAUZE/BANDAGES/DRESSINGS) ×2 IMPLANT
TOWEL GREEN STERILE (TOWEL DISPOSABLE) ×2 IMPLANT
TOWEL GREEN STERILE FF (TOWEL DISPOSABLE) ×2 IMPLANT
TRAY FOLEY W/METER SILVER 16FR (SET/KITS/TRAYS/PACK) ×2 IMPLANT
WATER STERILE IRR 1000ML POUR (IV SOLUTION) ×2 IMPLANT

## 2016-11-13 NOTE — Anesthesia Procedure Notes (Signed)
Procedure Name: Intubation Date/Time: 11/13/2016 2:36 PM Performed by: Melina Copa, Kiwana Deblasi R Pre-anesthesia Checklist: Patient identified, Emergency Drugs available, Suction available and Patient being monitored Patient Re-evaluated:Patient Re-evaluated prior to induction Oxygen Delivery Method: Circle System Utilized Preoxygenation: Pre-oxygenation with 100% oxygen Induction Type: IV induction Ventilation: Mask ventilation without difficulty Laryngoscope Size: Mac and 3 Grade View: Grade I Tube type: Oral Tube size: 7.5 mm Number of attempts: 1 Airway Equipment and Method: Stylet and Oral airway Placement Confirmation: ETT inserted through vocal cords under direct vision,  positive ETCO2 and breath sounds checked- equal and bilateral Secured at: 20 cm Tube secured with: Tape Dental Injury: Teeth and Oropharynx as per pre-operative assessment

## 2016-11-13 NOTE — Progress Notes (Signed)
Patient returned back from surgery at this time. VSS. Safety precautions and orders reviewed. Patient voided. No other complaints voice at this time.    Ave Filter, RN

## 2016-11-13 NOTE — Anesthesia Preprocedure Evaluation (Addendum)
Anesthesia Evaluation  Patient identified by MRN, date of birth, ID band Patient awake    Reviewed: Allergy & Precautions, NPO status , Patient's Chart, lab work & pertinent test results  History of Anesthesia Complications Negative for: history of anesthetic complications  Airway Mallampati: II  TM Distance: >3 FB Neck ROM: Full    Dental  (+) Dental Advisory Given   Pulmonary PE (3 years ago)   breath sounds clear to auscultation       Cardiovascular hypertension, Pt. on medications and Pt. on home beta blockers (-) angina+ DVT   Rhythm:Regular Rate:Normal     Neuro/Psych  Neuromuscular disease (Bell's palsy) negative psych ROS   GI/Hepatic Neg liver ROS, GERD  Medicated and Controlled,  Endo/Other  Morbid obesity  Renal/GU negative Renal ROS     Musculoskeletal  (+) Arthritis , Osteoarthritis,    Abdominal (+) + obese,   Peds  Hematology  (+) Blood dyscrasia (Hb 8.9), anemia , Hemochromatosis Prothrombin mutation   Anesthesia Other Findings   Reproductive/Obstetrics                            Anesthesia Physical Anesthesia Plan  ASA: III  Anesthesia Plan: General   Post-op Pain Management:    Induction: Intravenous  PONV Risk Score and Plan: 3 and Ondansetron, Dexamethasone and Midazolam  Airway Management Planned: Oral ETT  Additional Equipment:   Intra-op Plan:   Post-operative Plan: Extubation in OR  Informed Consent: I have reviewed the patients History and Physical, chart, labs and discussed the procedure including the risks, benefits and alternatives for the proposed anesthesia with the patient or authorized representative who has indicated his/her understanding and acceptance.   Dental advisory given  Plan Discussed with: CRNA and Surgeon  Anesthesia Plan Comments: (Plan routine monitors, GETA)        Anesthesia Quick Evaluation

## 2016-11-13 NOTE — Progress Notes (Addendum)
Patient continue to c/o "leg going sleep and numbness" since last night. Pt denied this issue prior to surgery except for pain. Pt able to feel RN touch her legs but "feel like its going numb and tingling with movement.". No redness or pain voice at thsi time on lower leg. Pt repositioned at this time.   Ave Filter, RN

## 2016-11-13 NOTE — Transfer of Care (Signed)
Immediate Anesthesia Transfer of Care Note  Patient: [name]  Procedure(s) Performed: Procedure(s): Revision of Lumbar Instruments (N/A)  Patient Location: PACU  Anesthesia Type:General  Level of Consciousness: awake, oriented and patient cooperative  Airway & Oxygen Therapy: Patient Spontanous Breathing and Patient connected to nasal cannula oxygen  Post-op Assessment: Report given to RN, Post -op Vital signs reviewed and stable and Patient moving all extremities  Post vital signs: Reviewed and stable  Last Vitals:  Vitals:   11/13/16 0540 11/13/16 0905  BP: (!) 128/56 (!) 106/49  Pulse: 75 84  Resp: 18   Temp: 36.9 C 37.2 C    Last Pain:  Vitals:   11/13/16 1012  TempSrc:   PainSc: Asleep      Patients Stated Pain Goal: 4 (16/60/60 0459)  Complications: No apparent anesthesia complications

## 2016-11-13 NOTE — Progress Notes (Signed)
Foley D/C at 0630 clear yellow output. C/O of throbbing headache 5/10. Left leg pain and left foot numbness.  Oxy Administered.

## 2016-11-13 NOTE — Progress Notes (Signed)
Pt returned from PACU at this time.  She is alert and oriented.  Dressing is CDI. HOB 20 degrees.  Bed alarm set and call bell within reach.  Family at bedside.

## 2016-11-13 NOTE — Progress Notes (Signed)
PT Cancellation Note  Patient Details Name: [name] MRN: [id number] DOB: 1941-02-16   Cancelled Treatment:    Reason Eval/Treat Not Completed: Patient not medically ready;Patient at procedure or test/unavailable.  Pt has returned to OR for redo lumbar surgery for ?loose screw fixation.  Will see later as able or if appropriate wait until 7/20. 11/13/2016  Donnella Sham, [place] [phone]  (pager)   [name] 11/13/2016, 1:10 PM

## 2016-11-13 NOTE — Progress Notes (Signed)
Spoke to [name] in [place] r/t pt's stat CT.   Ave Filter, RN

## 2016-11-13 NOTE — Progress Notes (Signed)
Patient ID: [name], female   DOB: [birth year], 76 y.o.   MRN: [id number] Subjective:  The patient is alert and pleasant. She complains of numbness in her left leg and foot. She has a mild headache. Her husband is at the bedside.  Objective: Vital signs in last 24 hours: Temp:  [97.5 F (36.4 C)-99 F (37.2 C)] 99 F (37.2 C) (07/19 0905) Pulse Rate:  [55-84] 84 (07/19 0905) Resp:  [11-21] 18 (07/19 0540) BP: (106-169)/(49-96) 106/49 (07/19 0905) SpO2:  [91 %-100 %] 91 % (07/19 0905) Weight:  [82.6 kg (182 lb)-90.7 kg (200 lb)] 90.7 kg (200 lb) (07/19 0540)  Intake/Output from previous day: 07/18 0701 - 07/19 0700 In: 2310 [P.O.:60; I.V.:2250] Out: 1400 [Urine:1325; Blood:75] Intake/Output this shift: No intake/output data recorded.  Physical exam the patient is alert and pleasant. Her dressing is clean and dry. Her strength is normal except her left EHL is weak at approximately 2/5.  Lab Results:  Recent Labs  11/13/16 0525  WBC 8.0  HGB 8.9*  HCT 28.2*  PLT 163   BMET  Recent Labs  11/13/16 0525  NA 135  K 3.4*  CL 96*  CO2 33*  GLUCOSE 127*  BUN 16  CREATININE 0.92  CALCIUM 8.5*    Studies/Results: Dg Lumbar Spine 2-3 Views  Result Date: 11/12/2016 CLINICAL DATA:  L4-S1 PLIF EXAM: LUMBAR SPINE - 2-3 VIEW; DG C-ARM 61-120 MIN COMPARISON:  Radiography from earlier today FINDINGS: AP and lateral intraprocedural fluoroscopic images show placement of L4, L5, and S1 screws. There has been laminectomy at the operative levels. Chronic L4-5 anterolisthesis. No unexpected finding. IMPRESSION: Fluoroscopy for L4-S1 posterior fusion. Electronically Signed   By: Monte Fantasia M.D.   On: 11/12/2016 16:36   Dg Lumbar Spine 1 View  Result Date: 11/12/2016 CLINICAL DATA:  Elective surgery.  L4-5 and L5-S1 PLIF. EXAM: LUMBAR SPINE - 1 VIEW COMPARISON:  09/04/2016 lumbar myelogram FINDINGS: Spinal numbering as on preoperative myelogram. Retractors present at the level  of the L4 and L5 spinous processes. A probe is present at the L5-S1 interspinous space. Degenerative changes and listhesis as described previously. IMPRESSION: Retractors present at the L4 and L5 spinous processes. Probe projects closest to the L5-S1 interspinous space. Electronically Signed   By: Monte Fantasia M.D.   On: 11/12/2016 14:44   Dg C-arm 1-60 Min  Result Date: 11/12/2016 CLINICAL DATA:  L4-S1 PLIF EXAM: LUMBAR SPINE - 2-3 VIEW; DG C-ARM 61-120 MIN COMPARISON:  Radiography from earlier today FINDINGS: AP and lateral intraprocedural fluoroscopic images show placement of L4, L5, and S1 screws. There has been laminectomy at the operative levels. Chronic L4-5 anterolisthesis. No unexpected finding. IMPRESSION: Fluoroscopy for L4-S1 posterior fusion. Electronically Signed   By: Monte Fantasia M.D.   On: 11/12/2016 16:36    Assessment/Plan: Postop day #1: The patient appears to have a left L5 radiculopathy. Upon further review of her fluoroscopic images I wonder if the left L5 pedicle screw is at a caudally placed and affecting the L5 nerve root. I'll send her for a CT to better evaluate this. I'll start Decadron.  LOS: 1 day     Larene Ascencio D 11/13/2016, 10:00 AM

## 2016-11-13 NOTE — Op Note (Signed)
Brief history: The patient is a 76 year old white female on whom I performed an L4-5 and L5-S1 redo decompression, instrumentation, and fusion yesterday. She has had left leg numbness and left eccentric hallicus lumbar weakness. I worked her up further with a lumbar CT which demonstrated the L5 pedicle screw appeared caudally displaced. I discussed the situation with the patient and her husband and recommend a revision of the instrumentation. The patient weighed the risks, benefits, and alternatives to surgery and decided to proceed with the operation.  Preoperative diagnosis: Malplaced left L5 pedicle screw  Postop diagnosis: The same  Procedure: Revision of lumbar instrumentation  Surgeon: Dr. Earle Gell  Assistant: None  Anesthesia: Gen. endotracheal  Estimated blood loss: Minimal  Specimens: None  Drains: None  Complications: None  Description of procedure: The patient was brought to the operating room by the anesthesia team. General endotracheal anesthesia was induced. The patient was turned to the prone position on the Wilson frame. Her lumbosacral region was then prepared with Betadine scrub and Betadine solution. Sterile drapes were applied. I then removed the patient's nylon skin sutures. I cut the subcutaneous and fascial sutures with the Mayo scissors. We inserted the Versatrac retractor for exposure. I remove the caps from the left pedicle screws and then remove the rods. I then removed the left L5 and S1 pedicle screw. I palpated down the screw hole and could not feel any clear cortical breaches. I then used a bone probes to relate the L5 and S1 pedicles under fluoroscopic guidance. I removed the bone probe and probed inside the tract and did not feel any cortical breaches. I then used a 5.5 mm tap to tap the left L5 and S1 pedicle. I room with the tap and again probed inside with a ball probe to rule out cortical breaches. I did not feel any. I then reinserted the patient's left  L5 and S1 pedicle screws now the new screw hole. We got good bony purchase. I then connected the left L4, L5 and S1 pedicle screw with a lordotic rod. We secured this in place with the caps. We then irrigated the wound out with bacitracin solution. We then removed the retractors. We then reapproximated patient's lumbosacral fascia with interrupted #1 Vicryl suture. We reapproximated the subcutaneous tissue with interrupted 2-0 Vicryl suture. We reapproximated the skin with a running 2-0 nylon suture. The wound was then coated with bacitracin ointment. A sterile dressing was applied. The drapes were removed. The patient was then returned to the supine position. By report all sponge, enhancement, and needle counts were correct at the end this case.

## 2016-11-13 NOTE — Progress Notes (Signed)
Subjective:  The patient is alert and pleasant. Her husband is at the bedside.  Objective: Vital signs in last 24 hours: Temp:  [97.5 F (36.4 C)-99 F (37.2 C)] 99 F (37.2 C) (07/19 0905) Pulse Rate:  [73-84] 84 (07/19 0905) Resp:  [11-21] 18 (07/19 0540) BP: (106-169)/(49-96) 106/49 (07/19 0905) SpO2:  [91 %-100 %] 91 % (07/19 0905) Weight:  [90.7 kg (200 lb)] 90.7 kg (200 lb) (07/19 0540)  Intake/Output from previous day: 07/18 0701 - 07/19 0700 In: 2310 [P.O.:60; I.V.:2250] Out: 1400 [Urine:1325; Blood:75] Intake/Output this shift: Total I/O In: 240 [P.O.:240] Out: -   Physical exam the patient is alert and pleasant. She has left foot drop.  Lab Results:  Recent Labs  11/13/16 0525  WBC 8.0  HGB 8.9*  HCT 28.2*  PLT 163   BMET  Recent Labs  11/13/16 0525  NA 135  K 3.4*  CL 96*  CO2 33*  GLUCOSE 127*  BUN 16  CREATININE 0.92  CALCIUM 8.5*    Studies/Results: Dg Lumbar Spine 2-3 Views  Result Date: 11/12/2016 CLINICAL DATA:  L4-S1 PLIF EXAM: LUMBAR SPINE - 2-3 VIEW; DG C-ARM 61-120 MIN COMPARISON:  Radiography from earlier today FINDINGS: AP and lateral intraprocedural fluoroscopic images show placement of L4, L5, and S1 screws. There has been laminectomy at the operative levels. Chronic L4-5 anterolisthesis. No unexpected finding. IMPRESSION: Fluoroscopy for L4-S1 posterior fusion. Electronically Signed   By: Monte Fantasia M.D.   On: 11/12/2016 16:36   Ct Lumbar Spine Wo Contrast  Result Date: 11/13/2016 CLINICAL DATA:  Lumbar radiculopathy.  Lumbar fusion 11/12/2016 EXAM: CT LUMBAR SPINE WITHOUT CONTRAST TECHNIQUE: Multidetector CT imaging of the lumbar spine was performed without intravenous contrast administration. Multiplanar CT image reconstructions were also generated. COMPARISON:  CT myelogram lumbar spine 09/04/2016 FINDINGS: Segmentation: Normal Alignment: Moderate levoscoliosis of the lumbar spine. Interval pedicle screw and posterior rod  fusion L4-5 and L5-S1. Mild retrolisthesis T12-L1, L1-2, L2-3, L3-4. 7 mm anterolisthesis L4-5 unchanged. Normal alignment at L5-S1. , Vertebrae: Negative for fracture or mass. Paraspinal and other soft tissues: Nonobstructing 7 mm left renal calculus. Negative for mass or fluid collection. Atherosclerotic disease in the aorta. Disc levels: T12-L1: Advanced disc degeneration. Negative for stenosis. Mild facet degeneration L1-2: Mild retrolisthesis. Disc and facet degeneration. Marked right foraminal encroachment due to bony overgrowth. No change from the prior myelogram. Central canal adequately patent L2-3: Disc degeneration and spurring. Severe facet hypertrophy on the right. Mild right foraminal narrowing without interval change L3-4: Mild retrolisthesis with disc degeneration. Interval laminectomy. Resection of the facet joint on the right. Fluid collection with gas bubble in this region of the right facet measures 20 x 30 mm . Gas also extends lateral to the facet joint on the right. Mild foraminal narrowing bilaterally. L4-5: Interval pedicle screw fusion. Wide laminectomy. Gas in the right epidural space laterally. No significant stenosis. Hardware in satisfactory position L5-S1: Interval pedicle screw fusion. Hardware in satisfactory position. Moderate facet degeneration without significant stenosis. IMPRESSION: Severe right foraminal encroachment L1-2 due to bony overgrowth, unchanged from the prior study. Mild right foraminal narrowing L2-3 Interval laminectomy L3-4. Fluid collection with gas in the region of the right facet joint which is been resected. This most likely is postop fluid or hematoma given recent surgery yesterday. Mild foraminal narrowing bilaterally Interval pedicle screw fusion at L4-5 and L5-S1 with laminectomy. No significant residual canal stenosis at these levels. Electronically Signed   By: Franchot Gallo M.D.   On:  11/13/2016 12:04   Dg Lumbar Spine 1 View  Result Date:  11/12/2016 CLINICAL DATA:  Elective surgery.  L4-5 and L5-S1 PLIF. EXAM: LUMBAR SPINE - 1 VIEW COMPARISON:  09/04/2016 lumbar myelogram FINDINGS: Spinal numbering as on preoperative myelogram. Retractors present at the level of the L4 and L5 spinous processes. A probe is present at the L5-S1 interspinous space. Degenerative changes and listhesis as described previously. IMPRESSION: Retractors present at the L4 and L5 spinous processes. Probe projects closest to the L5-S1 interspinous space. Electronically Signed   By: Monte Fantasia M.D.   On: 11/12/2016 14:44   Dg C-arm 1-60 Min  Result Date: 11/12/2016 CLINICAL DATA:  L4-S1 PLIF EXAM: LUMBAR SPINE - 2-3 VIEW; DG C-ARM 61-120 MIN COMPARISON:  Radiography from earlier today FINDINGS: AP and lateral intraprocedural fluoroscopic images show placement of L4, L5, and S1 screws. There has been laminectomy at the operative levels. Chronic L4-5 anterolisthesis. No unexpected finding. IMPRESSION: Fluoroscopy for L4-S1 posterior fusion. Electronically Signed   By: Monte Fantasia M.D.   On: 11/12/2016 16:36    Assessment/Plan: Malplaced pedicle screw, left L5 radiculopathy: I have discussed the situation with the patient and her husband. We have discussed the various treatment options. I recommend that we revise her lumbar instrumentation, i.e. redirect the left L5 pedicle screw. I have described the surgery to them. We have discussed the risks, benefits, alternatives, expected postoperative course, and likelihood of achieving our goals with surgery. I have answered all their questions. She wants to proceed with surgery.   LOS: 1 day     Deshana Rominger D 11/13/2016, 1:54 PM

## 2016-11-13 NOTE — Anesthesia Postprocedure Evaluation (Signed)
Anesthesia Post Note  Patient: [name]  Procedure(s) Performed: Procedure(s) (LRB): Revision of Lumbar Instruments (N/A)     Patient location during evaluation: PACU Anesthesia Type: General Level of consciousness: sedated, patient cooperative and oriented Pain management: pain level controlled Vital Signs Assessment: post-procedure vital signs reviewed and stable Respiratory status: spontaneous breathing, nonlabored ventilation, respiratory function stable and patient connected to nasal cannula oxygen Cardiovascular status: blood pressure returned to baseline and stable Postop Assessment: no signs of nausea or vomiting Anesthetic complications: no    Last Vitals:  Vitals:   11/13/16 1715 11/13/16 1755  BP:  (!) 157/71  Pulse:  72  Resp:  18  Temp: 37.1 C 36.9 C    Last Pain:  Vitals:   11/13/16 1755  TempSrc: Oral  PainSc:                  Demani Mcbrien,E. Lailyn Appelbaum

## 2016-11-14 NOTE — Anesthesia Postprocedure Evaluation (Signed)
Anesthesia Post Note  Patient: [name]  Procedure(s) Performed: Procedure(s) (LRB): POSTERIOR LUMBAR INTERBODY FUSION, INTERBODY PROSTHESIS, POSTERIOR LATERAL ARTHRODESIS,POSTERIOR SEGMENTAL INSTRUMENTATION LUMBAR FOUR  LUMBAR FIVE , LUMBAR FIVE - SACRAL ONE (N/A)     Patient location during evaluation: PACU Anesthesia Type: General Level of consciousness: awake, awake and alert and oriented Pain management: pain level controlled Vital Signs Assessment: post-procedure vital signs reviewed and stable Respiratory status: spontaneous breathing, nonlabored ventilation and respiratory function stable Cardiovascular status: blood pressure returned to baseline Anesthetic complications: no    Last Vitals:  Vitals:   11/14/16 0030 11/14/16 0400  BP: (!) 141/56 (!) 124/54  Pulse: 68 66  Resp: 18 18  Temp: 36.7 C 36.6 C    Last Pain:  Vitals:   11/14/16 0400  TempSrc: Oral  PainSc:                  [name]

## 2016-11-14 NOTE — Progress Notes (Signed)
Orthopedic Tech Progress Note Patient Details:  [name] 08-Sep-1940 [id number]  Patient ID: [name], female   DOB: 1940/08/24, 76 y.o.   MRN: [id number]   [name] 11/14/2016, 9:05 AMCalled [employer] for Lumbar brace and left foot orthosis.

## 2016-11-14 NOTE — Progress Notes (Signed)
Patient ID: [name], female   DOB: 1940/12/13, 76 y.o.   MRN: [id number] Subjective:  The patient is alert and pleasant. She denies headaches. She looks well. She still has left leg numbness. Her husband is at the bedside.  Objective: Vital signs in last 24 hours: Temp:  [97.9 F (36.6 C)-99 F (37.2 C)] 97.9 F (36.6 C) (07/20 0400) Pulse Rate:  [66-89] 66 (07/20 0400) Resp:  [16-20] 18 (07/20 0400) BP: (106-157)/(49-104) 124/54 (07/20 0400) SpO2:  [91 %-100 %] 98 % (07/20 0400)  Intake/Output from previous day: 07/19 0701 - 07/20 0700 In: 1160 [P.O.:360; I.V.:800] Out: 75 [Blood:75] Intake/Output this shift: No intake/output data recorded.  Physical exam the patient is alert and pleasant. Her strength is normal except in her left EHL/dorsiflexors which is 0/5.  Lab Results:  Recent Labs  11/13/16 0525  WBC 8.0  HGB 8.9*  HCT 28.2*  PLT 163   BMET  Recent Labs  11/13/16 0525  NA 135  K 3.4*  CL 96*  CO2 33*  GLUCOSE 127*  BUN 16  CREATININE 0.92  CALCIUM 8.5*    Studies/Results: Dg Lumbar Spine 2-3 Views  Result Date: 11/13/2016 CLINICAL DATA:  Revision of L5  screw EXAM: LUMBAR SPINE - 2-3 VIEW; DG C-ARM 61-120 MIN COMPARISON:  CT lumbar 11/13/2016 FINDINGS: AP and lateral C-arm images demonstrate pedicle screws at L4, L5 and S1. IMPRESSION: Bilateral Pedicle screw fusion L4, L5 and S1. Electronically Signed   By: Franchot Gallo M.D.   On: 11/13/2016 16:14   Dg Lumbar Spine 2-3 Views  Result Date: 11/12/2016 CLINICAL DATA:  L4-S1 PLIF EXAM: LUMBAR SPINE - 2-3 VIEW; DG C-ARM 61-120 MIN COMPARISON:  Radiography from earlier today FINDINGS: AP and lateral intraprocedural fluoroscopic images show placement of L4, L5, and S1 screws. There has been laminectomy at the operative levels. Chronic L4-5 anterolisthesis. No unexpected finding. IMPRESSION: Fluoroscopy for L4-S1 posterior fusion. Electronically Signed   By: Monte Fantasia M.D.   On: 11/12/2016 16:36    Ct Lumbar Spine Wo Contrast  Result Date: 11/13/2016 CLINICAL DATA:  Lumbar radiculopathy.  Lumbar fusion 11/12/2016 EXAM: CT LUMBAR SPINE WITHOUT CONTRAST TECHNIQUE: Multidetector CT imaging of the lumbar spine was performed without intravenous contrast administration. Multiplanar CT image reconstructions were also generated. COMPARISON:  CT myelogram lumbar spine 09/04/2016 FINDINGS: Segmentation: Normal Alignment: Moderate levoscoliosis of the lumbar spine. Interval pedicle screw and posterior rod fusion L4-5 and L5-S1. Mild retrolisthesis T12-L1, L1-2, L2-3, L3-4. 7 mm anterolisthesis L4-5 unchanged. Normal alignment at L5-S1. , Vertebrae: Negative for fracture or mass. Paraspinal and other soft tissues: Nonobstructing 7 mm left renal calculus. Negative for mass or fluid collection. Atherosclerotic disease in the aorta. Disc levels: T12-L1: Advanced disc degeneration. Negative for stenosis. Mild facet degeneration L1-2: Mild retrolisthesis. Disc and facet degeneration. Marked right foraminal encroachment due to bony overgrowth. No change from the prior myelogram. Central canal adequately patent L2-3: Disc degeneration and spurring. Severe facet hypertrophy on the right. Mild right foraminal narrowing without interval change L3-4: Mild retrolisthesis with disc degeneration. Interval laminectomy. Resection of the facet joint on the right. Fluid collection with gas bubble in this region of the right facet measures 20 x 30 mm . Gas also extends lateral to the facet joint on the right. Mild foraminal narrowing bilaterally. L4-5: Interval pedicle screw fusion. Wide laminectomy. Gas in the right epidural space laterally. No significant stenosis. Hardware in satisfactory position L5-S1: Interval pedicle screw fusion. Hardware in satisfactory position. Moderate facet  degeneration without significant stenosis. IMPRESSION: Severe right foraminal encroachment L1-2 due to bony overgrowth, unchanged from the prior  study. Mild right foraminal narrowing L2-3 Interval laminectomy L3-4. Fluid collection with gas in the region of the right facet joint which is been resected. This most likely is postop fluid or hematoma given recent surgery yesterday. Mild foraminal narrowing bilaterally Interval pedicle screw fusion at L4-5 and L5-S1 with laminectomy. No significant residual canal stenosis at these levels. Electronically Signed   By: Franchot Gallo M.D.   On: 11/13/2016 12:04   Dg Lumbar Spine 1 View  Result Date: 11/12/2016 CLINICAL DATA:  Elective surgery.  L4-5 and L5-S1 PLIF. EXAM: LUMBAR SPINE - 1 VIEW COMPARISON:  09/04/2016 lumbar myelogram FINDINGS: Spinal numbering as on preoperative myelogram. Retractors present at the level of the L4 and L5 spinous processes. A probe is present at the L5-S1 interspinous space. Degenerative changes and listhesis as described previously. IMPRESSION: Retractors present at the L4 and L5 spinous processes. Probe projects closest to the L5-S1 interspinous space. Electronically Signed   By: Monte Fantasia M.D.   On: 11/12/2016 14:44   Dg C-arm 1-60 Min  Result Date: 11/13/2016 CLINICAL DATA:  Revision of L5  screw EXAM: LUMBAR SPINE - 2-3 VIEW; DG C-ARM 61-120 MIN COMPARISON:  CT lumbar 11/13/2016 FINDINGS: AP and lateral C-arm images demonstrate pedicle screws at L4, L5 and S1. IMPRESSION: Bilateral Pedicle screw fusion L4, L5 and S1. Electronically Signed   By: Franchot Gallo M.D.   On: 11/13/2016 16:14   Dg C-arm 1-60 Min  Result Date: 11/12/2016 CLINICAL DATA:  L4-S1 PLIF EXAM: LUMBAR SPINE - 2-3 VIEW; DG C-ARM 61-120 MIN COMPARISON:  Radiography from earlier today FINDINGS: AP and lateral intraprocedural fluoroscopic images show placement of L4, L5, and S1 screws. There has been laminectomy at the operative levels. Chronic L4-5 anterolisthesis. No unexpected finding. IMPRESSION: Fluoroscopy for L4-S1 posterior fusion. Electronically Signed   By: Monte Fantasia M.D.   On:  11/12/2016 16:36    Assessment/Plan: Postop day #2: I'll ask physical therapy to mobilize the patient. We discussed rehabilitation but the patient was not interested.  LOS: 2 days     Deryck Hippler D 11/14/2016, 7:38 AM

## 2016-11-14 NOTE — Evaluation (Signed)
Physical Therapy Evaluation Patient Details Name: [name] MRN: [id number] DOB: 12-26-1940 Today's Date: 11/14/2016   History of Present Illness  Pt is a 76 y/o female s/p PLIF L4-S1 on 7/18 with revision of lumbar instrumentation on 7/19. PMH including but not limited to HTN and Bell's palsy.  Clinical Impression  Pt presented supine in bed with HOB elevated, awake and willing to participate in therapy session. Prior to admission, pt reported that she was independent with all functional mobility. Pt's spouse present throughout session. Pt currently requires mod A for bed mobility and mod A x2 for transfers. Pt with focal and functional L LE weakness and was unable to advance foot forwards or move L LE at all during transfer. Pt also with some impaired cognition (see below). Pt would greatly benefit from further intensive therapy services in CIR prior to returning home with excellent family support. PT will continue to follow acutely for mobility progression. Will plan to use L AFO next session.    Follow Up Recommendations CIR    Equipment Recommendations  None recommended by PT    Recommendations for Other Services Rehab consult     Precautions / Restrictions Precautions Precautions: Back;Fall Precaution Booklet Issued: No Precaution Comments: PT reviewed 3/3 back precautions with pt and pt's spouse. Required Braces or Orthoses: Spinal Brace;Other Brace/Splint Spinal Brace: Lumbar corset;Applied in sitting position Other Brace/Splint: L AFO present in room Restrictions Weight Bearing Restrictions: No      Mobility  Bed Mobility Overal bed mobility: Needs Assistance Bed Mobility: Rolling;Sidelying to Sit Rolling: Min assist Sidelying to sit: Mod assist       General bed mobility comments: increased time, use of bed rails, cueing for log roll technique, assist at bilateral LEs and trunk elevation to achieve sitting EOB  Transfers Overall transfer level: Needs  assistance Equipment used: Rolling walker (2 wheeled) Transfers: Sit to/from [facility] Sit to Stand: Mod assist Stand pivot transfers: Mod assist;+2 physical assistance;+2 safety/equipment       General transfer comment: increased time, cueing for technique and hand placement, mod A to power into standing and for stability, mod A x2 for safety with pivotal movements. Pt performed sit<>stand x1 from bed and x1 from [facility]. pt unable to advance L LE or move L LE at all with pivot.  Ambulation/Gait                Stairs            Wheelchair Mobility    Modified Rankin (Stroke Patients Only)       Balance Overall balance assessment: Needs assistance Sitting-balance support: Feet supported Sitting balance-Leahy Scale: Fair Sitting balance - Comments: pt able to sit EOB with supervision for safety   Standing balance support: During functional activity;Bilateral upper extremity supported Standing balance-Leahy Scale: Poor Standing balance comment: pt reliant on bilateral UEs on RW and mod A                             Pertinent Vitals/Pain Pain Assessment: 0-10 Pain Score: 3  Pain Location: back, L LE Pain Descriptors / Indicators: Sore;Shooting;Guarding Pain Intervention(s): Monitored during session;Repositioned    Home Living Family/patient expects to be discharged to:: Private residence Living Arrangements: Spouse/significant other Available Help at Discharge: Family Type of Home: House Home Access: Stairs to enter   Technical brewer of Steps: 2 Home Layout: One level Home Equipment: Environmental consultant - 2 wheels;Cane - single  point;Shower seat      Prior Function Level of Independence: Independent               Hand Dominance        Extremity/Trunk Assessment   Upper Extremity Assessment Upper Extremity Assessment: Overall WFL for tasks assessed    Lower Extremity Assessment Lower Extremity Assessment: LLE  deficits/detail LLE Deficits / Details: pt with decreased strength throughout; MMT revealed 2/5 for hip flexion, knee extension, knee flexion and ankle DF. Sensation is diminished to light touch throughout    Cervical / Trunk Assessment Cervical / Trunk Assessment: Other exceptions Cervical / Trunk Exceptions: pt is s/p lumbar sx  Communication   Communication: No difficulties  Cognition Arousal/Alertness: Awake/alert Behavior During Therapy: WFL for tasks assessed/performed Overall Cognitive Status: Impaired/Different from baseline Area of Impairment: Memory;Following commands;Safety/judgement;Problem solving                     Memory: Decreased recall of precautions;Decreased short-term memory Following Commands: Follows one step commands consistently Safety/Judgement: Decreased awareness of safety;Decreased awareness of deficits   Problem Solving: Difficulty sequencing;Requires verbal cues        General Comments      Exercises     Assessment/Plan    PT Assessment Patient needs continued PT services  PT Problem List Decreased strength;Decreased activity tolerance;Decreased balance;Decreased mobility;Decreased coordination;Decreased cognition;Decreased knowledge of use of DME;Decreased safety awareness;Decreased knowledge of precautions;Pain       PT Treatment Interventions DME instruction;Gait training;Stair training;Therapeutic activities;Functional mobility training;Therapeutic exercise;Balance training;Neuromuscular re-education;Cognitive remediation;Patient/family education    PT Goals (Current goals can be found in the Care Plan section)  Acute Rehab PT Goals Patient Stated Goal: return home PT Goal Formulation: With patient/family Time For Goal Achievement: 11/28/16 Potential to Achieve Goals: Good    Frequency Min 5X/week   Barriers to discharge        Co-evaluation               AM-PAC PT "6 Clicks" Daily Activity  Outcome Measure  Difficulty turning over in bed (including adjusting bedclothes, sheets and blankets)?: Total Difficulty moving from lying on back to sitting on the side of the bed? : Total Difficulty sitting down on and standing up from a chair with arms (e.g., wheelchair, bedside commode, etc,.)?: Total Help needed moving to and from a bed to chair (including a wheelchair)?: A Lot Help needed walking in hospital room?: A Lot Help needed climbing 3-5 steps with a railing? : A Lot 6 Click Score: 9    End of Session Equipment Utilized During Treatment: Gait belt;Back brace Activity Tolerance: Patient tolerated treatment well Patient left: in chair;with call bell/phone within reach;with chair alarm set;with family/visitor present Nurse Communication: Mobility status PT Visit Diagnosis: Unsteadiness on feet (R26.81);Other abnormalities of gait and mobility (R26.89);Pain Pain - Right/Left: Left Pain - part of body: Leg (back)    Time: 8676-1950 PT Time Calculation (min) (ACUTE ONLY): 37 min   Charges:   PT Evaluation $PT Eval Moderate Complexity: 1 Procedure PT Treatments $Therapeutic Activity: 8-22 mins   PT G Codes:        [place], PT, DPT [place] 11/14/2016, 5:39 PM

## 2016-11-14 NOTE — Progress Notes (Signed)
PT Cancellation Note  Patient Details Name: [name] MRN: [id number] DOB: [birth year]   Cancelled Treatment:    Reason Eval/Treat Not Completed: Patient not medically ready. Pt currently with strict bedrest orders in place. PT will await updated activity orders and f/u for PT evaluation as available.   [place] 11/14/2016, 8:31 AM

## 2016-11-14 NOTE — Care Management Note (Signed)
Case Management Note  Patient Details  Name: [name] MRN: [id number] Date of Birth: 04-Jun-1940  Subjective/Objective:      CM following for progression and d/c planning.               Action/Plan: 11/14/2016 Noted pt to OR on 11/12/16 and return to OR on 11/13/2016. Await PT/OT eval and recommendations. Will assist with DME and HH as needed and ordered.   Expected Discharge Date:                  Expected Discharge Plan:  [place]  In-House Referral:  NA  Discharge planning Services  CM Consult  Post Acute Care Choice:  Durable Medical Equipment, Home Health Choice offered to:     DME Arranged:    DME Agency:     HH Arranged:    HH Agency:     Status of Service:  In process, will continue to follow  If discussed at Long Length of Stay Meetings, dates discussed:    Additional Comments:  Adron Bene, RN 11/14/2016, 1:14 PM

## 2016-11-15 NOTE — Progress Notes (Signed)
Physical Therapy Treatment Patient Details Name: [name] MRN: [id number] DOB: 1940/10/05 Today's Date: 11/15/2016    History of Present Illness Pt is a 76 y/o female s/p PLIF L4-S1 on 7/18 with revision of lumbar instrumentation on 7/19. PMH including but not limited to HTN and Bell's palsy.    PT Comments    Most of initial part of session was spent with donning AFO to pt's left LE. Increased time and difficulty for this PTA to don due to brace being too wide for pt's narrow foot. Once donned pt performed partial stand x1 with her ankle rolling into lateral strut of brace. This is unsafe due to risk of breakdown of skin and pt with decreased sensation so she would not be aware of breakdown occurring. Removed brace for session for these reasons. RN in room and has paged the ortho tech to make them aware of these issues as it appears the delivered the brace to pt's room. Remainder of session addressed transfers with pt needing overall less assistance today than yesterday. Acute PT to continue during pt's hospital stay.    Follow Up Recommendations  CIR     Equipment Recommendations  None recommended by PT    Recommendations for Other Services Rehab consult     Precautions / Restrictions Precautions Precautions: Back;Fall Precaution Comments: Pt able to recall 3/3 back precautions without cues.  Required Braces or Orthoses: Spinal Brace;Other Brace/Splint Spinal Brace: Lumbar corset;Applied in sitting position Other Brace/Splint: L AFO present in room. attempted to use, does not fit pt and she supinates in stance into lateral strut of brace. Restrictions Weight Bearing Restrictions: No    Mobility  Bed Mobility Overal bed mobility: Needs Assistance   Rolling: Min assist Sidelying to sit: Mod assist       General bed mobility comments: bed flat with no rails used: increased time needed with cues on sequencing and technique. most assistance needed with trunk transiton  into seated position at edge of bed.   Transfers   Equipment used: Conservation officer, nature (2 wheeled) Transfers: Sit to/from [employer] to Stand: Min assist;From elevated surface Stand pivot transfers: Min assist       General transfer comment: bed elevated for 1st stand to RW with cues on hand placement. min assist with RW for stand pivot transfer bed to bsc with cues on sequencing/technique. min assist to stand x2 from bsc for pericare (supervision for static standing with RW support). min assist for 180 degree turn from bsc to sit in chair with RW. cues on sequencing with RW.                                    Cognition Arousal/Alertness: Awake/alert Behavior During Therapy: WFL for tasks assessed/performed Overall Cognitive Status: Within Functional Limits for tasks assessed             Pertinent Vitals/Pain Pain Assessment: 0-10 Pain Score: 6  Pain Location: back, L LE Pain Descriptors / Indicators: Aching;Discomfort;Sore;Shooting Pain Intervention(s): Limited activity within patient's tolerance;Monitored during session;Premedicated before session;Repositioned     PT Goals (current goals can now be found in the care plan section) Acute Rehab PT Goals Patient Stated Goal: return home PT Goal Formulation: With patient/family Time For Goal Achievement: 11/28/16 Potential to Achieve Goals: Good Progress towards PT goals: Progressing toward goals    Frequency    Min 5X/week  PT Plan Current plan remains appropriate    AM-PAC PT "6 Clicks" Daily Activity  Outcome Measure  Difficulty turning over in bed (including adjusting bedclothes, sheets and blankets)?: A Little Difficulty moving from lying on back to sitting on the side of the bed? : A Lot Difficulty sitting down on and standing up from a chair with arms (e.g., wheelchair, bedside commode, etc,.)?: A Little Help needed moving to and from a bed to chair (including a wheelchair)?: A  Little Help needed walking in hospital room?: A Lot Help needed climbing 3-5 steps with a railing? : A Lot 6 Click Score: 15    End of Session Equipment Utilized During Treatment: Gait belt;Back brace Activity Tolerance: Patient tolerated treatment well Patient left: in chair;with call bell/phone within reach;with chair alarm set Nurse Communication: Mobility status;Other (comment) (improper fit of AFO delivered to pt's room) PT Visit Diagnosis: Unsteadiness on feet (R26.81);Other abnormalities of gait and mobility (R26.89);Repeated falls (R29.6);Muscle weakness (generalized) (M62.81)     Time: 6283-1517 PT Time Calculation (min) (ACUTE ONLY): 31 min  Charges:  $Therapeutic Activity: 23-37 mins           Willow Ora, PTA, [facility] [address] [place], [place] [address] [phone] 11/15/16, 12:12 PM  Willow Ora 11/15/2016, 12:08 PM

## 2016-11-15 NOTE — Progress Notes (Signed)
Paged [employer] for afo brace. Per PT, afo brace is not fitted well

## 2016-11-15 NOTE — Progress Notes (Signed)
Patient had been voiding frequently throughout shift, output <200 each time. Bladder scan showed >1077mL. I&O drained 1315mL yellow urine. Patient educated. Patient has been up to chair x3 today for meals. Bathed with moderate assist. Unable to ambulate more than 5 feet d/t left foot drop. [employer] came by to see patient's AFO. Will continue to monitor.

## 2016-11-15 NOTE — Progress Notes (Signed)
Pt seen and examined.  No issues overnight. No HA. Pain manageable Still has left leg numbness  EXAM: Temp:  [98.1 F (36.7 C)-98.4 F (36.9 C)] 98.2 F (36.8 C) (07/21 0424) Pulse Rate:  [65-81] 65 (07/21 0424) Resp:  [18] 18 (07/21 0424) BP: (117-142)/(51-90) 136/55 (07/21 0424) SpO2:  [90 %-97 %] 90 % (07/21 0424) Intake/Output      07/20 0701 - 07/21 0700 07/21 0701 - 07/22 0700   P.O. 480    Total Intake(mL/kg) 480 (5.3)    Urine (mL/kg/hr) 1100 (0.5) 450 (2.1)   Total Output 1100 450   Net -620 -450         Awake and alert Follows commands throughout Full strength with exception of EHL/DF 0/5  Plan Stable Discussed CIR at rec of PT. Pt more open today to CIR Will consult CIR again

## 2016-11-16 DIAGNOSIS — M21372 Foot drop, left foot: Secondary | ICD-10-CM

## 2016-11-16 DIAGNOSIS — M4316 Spondylolisthesis, lumbar region: Secondary | ICD-10-CM

## 2016-11-16 DIAGNOSIS — N9989 Other postprocedural complications and disorders of genitourinary system: Secondary | ICD-10-CM

## 2016-11-16 DIAGNOSIS — R338 Other retention of urine: Secondary | ICD-10-CM

## 2016-11-16 MED ORDER — SENNA 8.6 MG PO TABS
2.0000 | ORAL_TABLET | Freq: Every day | ORAL | Status: DC
  Administered 2016-11-17 – 2016-11-18 (×2): 17.2 mg via ORAL
  Filled 2016-11-16 (×2): qty 2

## 2016-11-16 MED ORDER — WHITE PETROLATUM GEL
Status: AC
  Administered 2016-11-16: 21:00:00
  Filled 2016-11-16: qty 1

## 2016-11-16 NOTE — Consult Note (Signed)
Physical Medicine and Rehabilitation Consult Reason for Consult:Left leg numbness Referring Phsyician: Taralyn Ferraiolo is an 76 y.o. female.   HPI: Patient's main complaints of back and leg pain, she initially underwent medical management but had persistent pain. Was evaluated by neurosurgery as an outpatient and had a lumbar CT myelogram, on 09/04/2016 demonstrating lumbar retrolisthesis, 2 mm at L2-3, L3-4, 9 mm of anterolisthesis L4 on 5. Bilateral lateral recess narrowing. L1 to lateral recess stenosis L4-5 and L5-S1. Compared to 2011, progressive right-sided L1-L2 foraminal encroachment, possible L4-5 foraminal stenosis causing potential neural compression. Admitted for decompression and fusion on 11/12/2016. Underwent L4-S1 PLIF on 11/12/2016, required revision, L5, pedicle screw on 11/13/2016. Physical therapy evaluation on 11/14/2016, left lower extremity weakness, difficulty advancing left foot forward, mod assist bed mobility and mod assist times 2 for transfers. CIR recommended Postop urinary retention with  1350 mL cath Review of Systems - Negative except Left foot numbness, left foot and ankle weakness, urine retention Denies chest pain, shortness breath, nausea, vomiting, diarrhea, constipation, skin rash, visual changes, sweats and chills, generalized weakness, swallowing problems Past Medical History:  Diagnosis Date  . Anemia   . Arthritis   . Bell's palsy   . GERD (gastroesophageal reflux disease)   . Hemochromatosis   . History of hiatal hernia   . Hypertension   . PE (pulmonary embolism)   . Pneumonia    "walking" pneumonia  . Prothrombin gene mutation ([place]) 05/30/2013  . Restless legs   . Right leg DVT ([place]) 05/30/2013  . Stroke [facility]    found on a MRI, she's not aware otherwise   Past Surgical History:  Procedure Laterality Date  . BACK SURGERY    . COLONOSCOPY    . SHOULDER SURGERY Bilateral    Family History  Problem Relation Age of Onset  . Congestive  Heart Failure Mother   . Pancreatic cancer Father    Social History:  reports that she has never smoked. She has never used smokeless tobacco. She reports that she does not drink alcohol or use drugs. Allergies:  Allergies  Allergen Reactions  . Penicillins Anaphylaxis    Pt was 40 or 76 years old  Has patient had a PCN reaction causing immediate rash, facial/tongue/throat swelling, SOB or lightheadedness with hypotension: Yes Has patient had a PCN reaction causing severe rash involving mucus membranes or skin necrosis: Unknown Has patient had a PCN reaction that required hospitalization: No Has patient had a PCN reaction occurring within the last 10 years: No If all of the above answers are "NO", then may proceed with Cephalosporin use.   . Sulfa Antibiotics Other (See Comments)    unknown  . Morphine And Related Other (See Comments)    Twitching in large doses    Medications Prior to Admission  Medication Sig Dispense Refill  . amLODipine (NORVASC) 5 MG tablet Take 5 mg by mouth daily.     Marland Kitchen aspirin EC 81 MG tablet Take 162 mg by mouth daily.    . Biotin 5000 MCG TABS Take 5,000 mcg by mouth daily.     Renard Hamper Cohosh (REMIFEMIN PO) Take 1 tablet by mouth 2 (two) times daily.    . Calcium Carbonate-Vitamin D (CALCIUM 600+D PO) Take 1 tablet by mouth 2 (two) times daily.    . Cholecalciferol (VITAMIN D) 2000 UNITS tablet Take 2,000 Units by mouth 2 (two) times daily.    . citalopram (CELEXA) 20 MG tablet Take 20 mg by mouth at  bedtime.     . fish oil-omega-3 fatty acids 1000 MG capsule Take 1 g by mouth daily.     . hydrochlorothiazide (HYDRODIURIL) 25 MG tablet Take 25 mg by mouth daily.    . Hypromellose (ARTIFICIAL TEARS OP) Place 1 drop into both eyes 3 (three) times daily as needed (dry eyes).    . Multiple Vitamin (MULTIVITAMIN WITH MINERALS) TABS tablet Take 1 tablet by mouth daily.    Marland Kitchen oxyCODONE-acetaminophen (PERCOCET/ROXICET) 5-325 MG tablet Take 1 tablet by mouth 3 (three)  times daily as needed for moderate pain.     . pantoprazole (PROTONIX) 40 MG tablet Take 40 mg by mouth daily before supper.    . pramipexole (MIRAPEX) 0.5 MG tablet Take 0.5 mg by mouth every evening.    . propranolol (INDERAL) 10 MG tablet Take 10 mg by mouth 2 (two) times daily.     . RESTASIS 0.05 % ophthalmic emulsion Place 1 drop into both eyes 2 (two) times daily.    . temazepam (RESTORIL) 15 MG capsule Take 2 capsules (30 mg total) by mouth at bedtime. (Patient taking differently: Take 15 mg by mouth at bedtime. ) 30 capsule 3  . Turmeric Curcumin 500 MG CAPS Take 500 mg by mouth at bedtime.     . vitamin E 400 UNIT capsule Take 400 Units by mouth daily.    . colchicine 0.6 MG tablet Take 1 tablet by mouth daily as needed. Gout flare up.    . cyclobenzaprine (FLEXERIL) 10 MG tablet Take 1 tablet (10 mg total) by mouth 2 (two) times daily as needed for muscle spasms. 12 tablet 0  . HYDROcodone-acetaminophen (NORCO/VICODIN) 5-325 MG tablet Take 1 tablet by mouth every 6 (six) hours as needed. (Patient not taking: Reported on 08/27/2016) 10 tablet 0  . methocarbamol (ROBAXIN) 500 MG tablet Take 1 tablet (500 mg total) by mouth 2 (two) times daily. (Patient not taking: Reported on 10/30/2016) 20 tablet 0    Home: [place] expects to be discharged to:: Private residence Living Arrangements: Spouse/significant other Available Help at Discharge: Family Type of Home: House Home Access: Stairs to enter Technical brewer of Steps: 2 Home Layout: One level Bathroom Shower/Tub: Public librarian, Walk-in shower Home Equipment: Environmental consultant - 2 wheels, [employer] - single point, Careers adviser History:   Functional Status:  Mobility:          ADL:    Cognition: Cognition Overall Cognitive Status: Within Functional Limits for tasks assessed Orientation Level: Oriented X4 Cognition Arousal/Alertness: Awake/alert Behavior During Therapy: WFL for tasks  assessed/performed Overall Cognitive Status: Within Functional Limits for tasks assessed Area of Impairment: Memory, Following commands, Safety/judgement, Problem solving Memory: Decreased recall of precautions, Decreased short-term memory Following Commands: Follows one step commands consistently Safety/Judgement: Decreased awareness of safety, Decreased awareness of deficits Problem Solving: Difficulty sequencing, Requires verbal cues  Blood pressure (!) 130/57, pulse 81, temperature 98.3 F (36.8 C), temperature source Oral, resp. rate 18, height 5' 2.5" (1.588 m), weight 90.7 kg (200 lb), SpO2 98 %. Physical Exam   General: No acute distress Mood and affect are appropriate Heart: Regular rate and rhythm no rubs murmurs or extra sounds Lungs: Clear to auscultation, breathing unlabored, no rales or wheezes Abdomen: Positive bowel sounds, soft nontender to palpation, nondistended Extremities: No clubbing, cyanosis, or edema Skin: No evidence of breakdown, no evidence of rash Neurologic: Cranial nerves II through XII intact, motor strength is 5/5 in bilateral deltoid, bicep, tricep, grip, R hip  flexor, knee extensors, ankle dorsiflexor and plantar flexor 4/5 left hip flexor, knee extensor, 0 ankle dorsiflexes and plantar flexion, toe flexion, extension Sensory exam normal sensation to light touch and proprioception in bilateral upper and right lower extremities Absent light touch sensation. Left L5 and left S1, reduced proprioception left great toe Cerebellar exam normal finger to nose to finger as well as heel to shin in bilateral upper and lower extremities Musculoskeletal: Full range of motion in all 4 extremities. No joint swelling No results found for this or any previous visit (from the past 24 hour(s)). No results found.  Assessment/Plan: Diagnosis: Lumbar spondylolisthesis status post fusion with left L5 radiculopathy causing foot drop, postoperative urinary retention 1. Does  the need for close, 24 hr/day medical supervision in concert with the patient's rehab needs make it unreasonable for this patient to be served in a less intensive setting? Yes 2. Co-Morbidities requiring supervision/potential complications: Hypertension, urinary retention requiring catheterization, wound care 3. Due to bladder management, bowel management, safety, skin/wound care, disease management, medication administration, pain management and patient education, does the patient require 24 hr/day rehab nursing? Yes 4. Does the patient require coordinated care of a physician, rehab nurse, PT (1-2 hrs/day, 5 days/week) and OT (1-2 hrs/day, 5 days/week) to address physical and functional deficits in the context of the above medical diagnosis(es)? Yes Addressing deficits in the following areas: balance, endurance, locomotion, strength, transferring, bowel/bladder control, bathing, dressing, feeding, grooming, toileting and psychosocial support 5. Can the patient actively participate in an intensive therapy program of at least 3 hrs of therapy per day at least 5 days per week? Yes 6. The potential for patient to make measurable gains while on inpatient rehab is excellent 7. Anticipated functional outcomes upon discharge from inpatients are Mod I PT, Mod I OT, NASLP 8. Estimated rehab length of stay to reach the above functional goals is: 10-14d 9. Does the patient have adequate social supports to accommodate these discharge functional goals? Yes 10. Anticipated D/C setting: Home 11. Anticipated post D/C treatments: [place] therapy 12. Overall Rehab/Functional Prognosis: excellent  RECOMMENDATIONS: This patient's condition is appropriate for continued rehabilitative care in the following setting: CIR Patient has agreed to participate in recommended program. Yes Note that insurance prior authorization may be required for reimbursement for recommended care.  Comment:   Charlett Blake 11/16/2016

## 2016-11-16 NOTE — Progress Notes (Addendum)
Pt seen and examined.  I&O required yesterday due to abnormal bladder scan. No abd pain. [employer] adjusting AFO. Unable to ambulate without d/t left foot drop No concerns with exception of wanting to get up but unable to due to foot drop  EXAM: Temp:  [98.2 F (36.8 C)-98.7 F (37.1 C)] 98.2 F (36.8 C) (07/22 0505) Pulse Rate:  [69-84] 69 (07/22 0505) Resp:  [16-20] 18 (07/22 0505) BP: (106-146)/(43-83) 125/43 (07/22 0505) SpO2:  [93 %-98 %] 96 % (07/22 0505) Intake/Output      07/21 0701 - 07/22 0700 07/22 0701 - 07/23 0700   Urine (mL/kg/hr) 2525 (1.2)    Total Output 2525     Net -2525          Urine Occurrence 2 x     Awake and alert Follows commands throughout Full strength with exception of EHL/DF 0/5  Plan Stable Continue current care Waiting on new AFO from [employer] CIR consult tomorrow hopefully

## 2016-11-16 NOTE — Progress Notes (Signed)
Physical Therapy Treatment Patient Details Name: [name] MRN: [id number] DOB: 1941-02-09 Today's Date: 11/16/2016    History of Present Illness Pt is a 76 y/o female s/p PLIF L4-S1 on 7/18 with revision of lumbar instrumentation on 7/19. PMH including but not limited to HTN and Bell's palsy.    PT Comments    Pt presents with improved cognition and pain, still with LLE motor weakness and awaiting new/better fitting AFO with ankle stability from [employer].  Continues to require min-mod assist and has not yet attempted ambulation due to ankle instability. Hopeful for CIR admission asap, motivated to improve.  PT continues to follow acutely.    Follow Up Recommendations  CIR     Equipment Recommendations  None recommended by PT    Recommendations for Other Services Rehab consult     Precautions / Restrictions Precautions Precautions: Back;Fall Precaution Booklet Issued: No Precaution Comments: Pt able to recall 3/3 back precautions without cues.  Required Braces or Orthoses: Spinal Brace;Other Brace/Splint Spinal Brace: Lumbar corset;Applied in sitting position Other Brace/Splint: left AFO, ill-fitting, awaiting new brace from [employer]; foot drop and ankle rolls into eversion    Mobility  Bed Mobility Overal bed mobility: Needs Assistance Bed Mobility: Rolling;Sidelying to Sit Rolling: Min assist Sidelying to sit: Mod assist       General bed mobility comments: assist needed for rolling using bed pad, and to initiate trunk elevation  Transfers Overall transfer level: Needs assistance Equipment used: Rolling walker (2 wheeled) Transfers: Sit to/from [facility] Sit to Stand: Min assist Stand pivot transfers: Min assist       General transfer comment: standing improved from previous encounter; only attempted pivot to chair due to foot drop/ankle instability; uses RW well with occasional need to block left knee  Ambulation/Gait              General Gait Details: awaiting left AFO for foot drop/lateral ankle stability   Stairs            Wheelchair Mobility    Modified Rankin (Stroke Patients Only)       Balance Overall balance assessment: Needs assistance Sitting-balance support: Feet supported;No upper extremity supported Sitting balance-Leahy Scale: Good     Standing balance support: During functional activity;Bilateral upper extremity supported Standing balance-Leahy Scale: Poor Standing balance comment: depends on device due to LE weakness                            Cognition Arousal/Alertness: Awake/alert Behavior During Therapy: WFL for tasks assessed/performed Overall Cognitive Status: Within Functional Limits for tasks assessed                                 General Comments: no issues with cogntion evident this session      Exercises      General Comments General comments (skin integrity, edema, etc.): donned brace; taught assisted dorsiflexion to promote motor recovery, using sheet 5-10 time hourly to every other hour as able      Pertinent Vitals/Pain Pain Assessment: 0-10 Pain Score: 3  Pain Location: back, L LE Pain Descriptors / Indicators: Aching;Discomfort;Sore;Shooting Pain Intervention(s): Limited activity within patient's tolerance;Premedicated before session;Repositioned    Home Living                      Prior Function  PT Goals (current goals can now be found in the care plan section) Acute Rehab PT Goals Patient Stated Goal: return home PT Goal Formulation: With patient/family Progress towards PT goals: Progressing toward goals (limited due to AFO issue)    Frequency    Min 5X/week      PT Plan Current plan remains appropriate    Co-evaluation              AM-PAC PT "6 Clicks" Daily Activity  Outcome Measure  Difficulty turning over in bed (including adjusting bedclothes, sheets and blankets)?: A  Little Difficulty moving from lying on back to sitting on the side of the bed? : A Little Difficulty sitting down on and standing up from a chair with arms (e.g., wheelchair, bedside commode, etc,.)?: A Little Help needed moving to and from a bed to chair (including a wheelchair)?: A Little Help needed walking in hospital room?: A Lot Help needed climbing 3-5 steps with a railing? : Total 6 Click Score: 15    End of Session Equipment Utilized During Treatment: Gait belt;Back brace Activity Tolerance: Patient tolerated treatment well Patient left: in bed;with call bell/phone within reach;with family/visitor present Nurse Communication: Mobility status PT Visit Diagnosis: Other symptoms and signs involving the nervous system (R29.898);Other abnormalities of gait and mobility (R26.89) Pain - Right/Left: Left Pain - part of body: Leg     Time: 6568-1275 PT Time Calculation (min) (ACUTE ONLY): 29 min  Charges:  $Therapeutic Exercise: 8-22 mins $Therapeutic Activity: 8-22 mins                    G Codes:       Kearney Hard, PT, DPT, MS Board Certified Geriatric Specialist   Kearney Hard [facility] 11/16/2016, 4:16 PM

## 2016-11-17 MED ORDER — GABAPENTIN 600 MG PO TABS
300.0000 mg | ORAL_TABLET | Freq: Three times a day (TID) | ORAL | Status: DC
  Administered 2016-11-17 – 2016-11-18 (×4): 300 mg via ORAL
  Filled 2016-11-17 (×4): qty 1

## 2016-11-17 NOTE — Progress Notes (Signed)
Physical Therapy Treatment Patient Details Name: [name] MRN: [id number] DOB: 02/08/1941 Today's Date: 11/17/2016    History of Present Illness Pt is a 76 y/o female s/p PLIF L4-S1 on 7/18 with revision of lumbar instrumentation on 7/19. PMH including but not limited to HTN and Bell's palsy.    PT Comments    Met with patient for mobility progression and assessment of AFO with orthotist. Patient tolerated OOB sit <> stand transfer and side steps with assist. Remains unable to perform forward progression of ambulation due to significant instability affects distal LLE. Worked in conjunction with orthotist during session for fitting of orthotic device. Will attempt different brace tomorrow. Current POC remains appropriate.    Follow Up Recommendations  CIR     Equipment Recommendations  None recommended by PT    Recommendations for Other Services Rehab consult     Precautions / Restrictions Precautions Precautions: Back;Fall Precaution Booklet Issued: No Precaution Comments: Pt able to recall 3/3 back precautions without cues.  Required Braces or Orthoses: Spinal Brace;Other Brace/Splint Spinal Brace: Lumbar corset;Applied in sitting position Other Brace/Splint: left AFO, ill-fitting, awaiting new brace from [employer]; foot drop and ankle rolls into eversion Restrictions Weight Bearing Restrictions: No    Mobility  Bed Mobility Overal bed mobility: Needs Assistance Bed Mobility: Rolling;Sidelying to Sit;Sit to Sidelying Rolling: Min assist Sidelying to sit: Mod assist     Sit to sidelying: Mod assist General bed mobility comments: assist to elevate trunk to upright and for positioning of LEs in bed  Transfers Overall transfer level: Needs assistance Equipment used: Rolling walker (2 wheeled) Transfers: Sit to/from [facility] Sit to Stand: Min assist Stand pivot transfers: Min assist       General transfer comment: Vcs for hand placement, min  assist to standing, reports discomfort and pain in LLE (foot)  Ambulation/Gait Ambulation/Gait assistance: Mod assist Ambulation Distance (Feet): 4 Feet Assistive device: Rolling walker (2 wheeled) Gait Pattern/deviations: Step-to pattern     General Gait Details: awaiting left AFO;  for foot drop/lateral ankle stability. Attempted side steps to [facility] tolerate but limited significantly with LLE   Stairs            Wheelchair Mobility    Modified Rankin (Stroke Patients Only)       Balance Overall balance assessment: Needs assistance Sitting-balance support: Feet supported;No upper extremity supported Sitting balance-Leahy Scale: Good     Standing balance support: During functional activity;Bilateral upper extremity supported Standing balance-Leahy Scale: Poor                              Cognition Arousal/Alertness: Awake/alert Behavior During Therapy: WFL for tasks assessed/performed Overall Cognitive Status: Within Functional Limits for tasks assessed                                 General Comments: no issues with cogntion evident this session      Exercises      General Comments        Pertinent Vitals/Pain Pain Assessment: 0-10 Pain Score: 6  Pain Location: back, L LE (foot) Pain Descriptors / Indicators: Aching;Discomfort;Sore;Shooting Pain Intervention(s): Monitored during session    Home Living                      Prior Function  PT Goals (current goals can now be found in the care plan section) Acute Rehab PT Goals Patient Stated Goal: return home PT Goal Formulation: With patient/family Time For Goal Achievement: 11/28/16 Potential to Achieve Goals: Good Progress towards PT goals: Progressing toward goals    Frequency    Min 5X/week      PT Plan Current plan remains appropriate    Co-evaluation              AM-PAC PT "6 Clicks" Daily Activity  Outcome Measure  Difficulty  turning over in bed (including adjusting bedclothes, sheets and blankets)?: A Little Difficulty moving from lying on back to sitting on the side of the bed? : A Little Difficulty sitting down on and standing up from a chair with arms (e.g., wheelchair, bedside commode, etc,.)?: A Little Help needed moving to and from a bed to chair (including a wheelchair)?: A Little Help needed walking in hospital room?: A Lot Help needed climbing 3-5 steps with a railing? : Total 6 Click Score: 15    End of Session Equipment Utilized During Treatment: Gait belt;Back brace Activity Tolerance: Patient tolerated treatment well Patient left: in bed;with call bell/phone within reach;with family/visitor present Nurse Communication: Mobility status PT Visit Diagnosis: Other symptoms and signs involving the nervous system (R29.898);Other abnormalities of gait and mobility (R26.89) Pain - Right/Left: Left Pain - part of body: Leg     Time: 6147-0929 PT Time Calculation (min) (ACUTE ONLY): 25 min  Charges:  $Therapeutic Activity: 23-37 mins                    G Codes:       Alben Deeds, PT DPT  Board Certified Neurologic Specialist [phone]    Duncan Dull 11/17/2016, 4:43 PM

## 2016-11-17 NOTE — NC FL2 (Signed)
[place] LEVEL OF CARE SCREENING TOOL     IDENTIFICATION  Patient Name: [name] Birthdate: 04-19-1941 Sex: female Admission Date (Current Location): 11/12/2016  [facility] and [place] Number:  [employer] and Address:  [facility], [place] [address] [place], [place] [address]      Provider Number: [id number]  Attending Physician Name and Address:  Newman Pies, MD  Relative Name and Phone Number:       Current Level of Care: Hospital Recommended Level of Care: [place] Prior Approval Number:    Date Approved/Denied:   PASRR Number: [id number]  Discharge Plan: SNF    Current Diagnoses: Patient Active Problem List   Diagnosis Date Noted  . Spondylolisthesis of lumbar region 11/12/2016  . Hereditary hemochromatosis ([place]) 07/28/2016  . Prothrombin gene mutation ([place]) 05/30/2013  . Right leg DVT ([place]) 05/30/2013  . Hemochromatosis 07/29/2012    Orientation RESPIRATION BLADDER Height & Weight     Self, Time, Situation, Place  Normal External catheter Weight: 200 lb (90.7 kg) Height:  5' 2.5" (158.8 cm)  BEHAVIORAL SYMPTOMS/MOOD NEUROLOGICAL BOWEL NUTRITION STATUS      Continent    AMBULATORY STATUS COMMUNICATION OF NEEDS Skin   Limited Assist Verbally Surgical wounds                       Personal Care Assistance Level of Assistance  Bathing, Dressing Bathing Assistance: Limited assistance   Dressing Assistance: Limited assistance     Functional Limitations Info             SPECIAL CARE FACTORS FREQUENCY  PT (By licensed PT), OT (By licensed OT)     PT Frequency: 5x/wk OT Frequency: 5x/wk            Contractures      Additional Factors Info  Code Status, Allergies, Psychotropic Code Status Info: full Allergies Info: Penicillins, Sulfa Antibiotics, Morphine And Related Psychotropic Info: Celexa 20mg          Current Medications (11/17/2016):  This is the current  hospital active medication list Current Facility-Administered Medications  Medication Dose Route Frequency Provider Last Rate Last Dose  . 0.9 %  sodium chloride infusion  250 mL Intravenous Continuous Newman Pies, MD      . acetaminophen (TYLENOL) tablet 650 mg  650 mg Oral Q4H PRN Newman Pies, MD       Or  . acetaminophen (TYLENOL) suppository 650 mg  650 mg Rectal Q4H PRN Newman Pies, MD      . amLODipine (NORVASC) tablet 5 mg  5 mg Oral Daily Newman Pies, MD   5 mg at 11/17/16 0916  . bisacodyl (DULCOLAX) suppository 10 mg  10 mg Rectal Daily PRN Newman Pies, MD      . citalopram (CELEXA) tablet 20 mg  20 mg Oral QHS Newman Pies, MD   20 mg at 11/16/16 2112  . colchicine tablet 0.6 mg  0.6 mg Oral Daily Newman Pies, MD   0.6 mg at 11/17/16 0915  . cyclobenzaprine (FLEXERIL) tablet 10 mg  10 mg Oral TID PRN Newman Pies, MD   10 mg at 11/17/16 0916  . cycloSPORINE (RESTASIS) 0.05 % ophthalmic emulsion 1 drop  1 drop Both Eyes BID Newman Pies, MD   1 drop at 11/17/16 0917  . docusate sodium (COLACE) capsule 100 mg  100 mg Oral BID Newman Pies, MD   100 mg at 11/17/16 0915  .  gabapentin (NEURONTIN) tablet 300 mg  300 mg Oral TID Newman Pies, MD   300 mg at 11/17/16 1538  . hydrochlorothiazide (HYDRODIURIL) tablet 25 mg  25 mg Oral Daily Newman Pies, MD   25 mg at 11/17/16 0916  . lactated ringers infusion   Intravenous Continuous Roberts Gaudy, MD 50 mL/hr at 11/12/16 1231    . menthol-cetylpyridinium (CEPACOL) lozenge 3 mg  1 lozenge Oral PRN Newman Pies, MD       Or  . phenol [facility]) mouth spray 1 spray  1 spray Mouth/Throat PRN Newman Pies, MD      . morphine 4 MG/ML injection 4 mg  4 mg Intravenous Q2H PRN Newman Pies, MD   4 mg at 11/15/16 0313  . multivitamin with minerals tablet 1 tablet  1 tablet Oral Daily Newman Pies, MD   1 tablet at 11/17/16 [phone]  . ondansetron (ZOFRAN) tablet 4 mg  4 mg Oral Q6H  PRN Newman Pies, MD       Or  . ondansetron [facility]) injection 4 mg  4 mg Intravenous Q6H PRN Newman Pies, MD      . oxyCODONE (Oxy IR/ROXICODONE) immediate release tablet 5-10 mg  5-10 mg Oral Q3H PRN Newman Pies, MD   10 mg at 11/17/16 1538  . pantoprazole (PROTONIX) EC tablet 40 mg  40 mg Oral QAC supper Newman Pies, MD   40 mg at 11/16/16 1634  . polyvinyl alcohol (LIQUIFILM TEARS) 1.4 % ophthalmic solution 1 drop  1 drop Both Eyes PRN Newman Pies, MD      . pramipexole (MIRAPEX) tablet 0.5 mg  0.5 mg Oral QPM Newman Pies, MD   0.5 mg at 11/16/16 1733  . propranolol (INDERAL) tablet 10 mg  10 mg Oral BID Newman Pies, MD   10 mg at 11/17/16 0915  . senna (SENOKOT) tablet 17.2 mg  2 tablet Oral Daily Newman Pies, MD   17.2 mg at 11/17/16 0916  . sodium chloride flush (NS) 0.9 % injection 3 mL  3 mL Intravenous Q12H Newman Pies, MD   3 mL at 11/17/16 0917  . sodium chloride flush (NS) 0.9 % injection 3 mL  3 mL Intravenous PRN Newman Pies, MD      . temazepam (RESTORIL) capsule 15 mg  15 mg Oral QHS PRN Newman Pies, MD         Discharge Medications: Please see discharge summary for a list of discharge medications.  Relevant Imaging Results:  Relevant Lab Results:   Additional Information SS#: [id number]  Geralynn Ochs, LCSW

## 2016-11-17 NOTE — Progress Notes (Signed)
CM provided patient and her daughter a list for SNF as backup. The patient and her daughter requested to be faxed out in the [facility] area. CSW updated. CM following.

## 2016-11-17 NOTE — Care Management Important Message (Signed)
Important Message  Patient Details  Name: [name] MRN: [id number] Date of Birth: 11-05-1940   Medicare Important Message Given:  Yes    Lebert Lovern 11/17/2016, 1:08 PM

## 2016-11-17 NOTE — Progress Notes (Signed)
Rehab admissions - I met with patient and her daughter at the bedside.  They would like inpatient rehab admission.  I have called and faxed information to [facility] requesting acute inpatient rehab admission.  I will follow up in am.  Call me for questions.  #[phone]

## 2016-11-17 NOTE — Significant Event (Signed)
@   01:35 Pt void with an output of 110 ml. Bladder scan done and showed 815 ml; in and out cath done and emptied 900 cc.  @05 :30 Bladder scan done again and showed 631 ml; pt said she has decrease sensation in her bladder to void; Per order of Dr. Vertell Limber, MD on call  to place a foley cath. Foley catheter inserted with 2 nurses at bedside and  sterile technique maintained. 700 cc immediately  emptied to foley bag. Pt tolerated well.

## 2016-11-17 NOTE — Progress Notes (Signed)
Orthopedic Tech Progress Note Patient Details:  [name] 03-25-1941 [id number]  Patient ID: [name], female   DOB: 1941/02/27, 76 y.o.   MRN: [id number]   [name] 11/17/2016, 12:33 PMCalled [employer] for left PRAFO brace.

## 2016-11-17 NOTE — Discharge Summary (Signed)
Physician Discharge Summary  Patient ID: [name] MRN: [id number] DOB/AGE: 76-04-1940 76 y.o.  Admit date: 11/12/2016 Discharge date: 11/17/2016  Admission Diagnoses:L4-5 and L5-S1 disc degeneration, spondylolisthesis, lumbago, lumbar radiculopathy, neurogenic claudication  Discharge Diagnoses: The same Active Problems:   Spondylolisthesis of lumbar region   Discharged Condition: fair  Hospital Course: I performed an L4-5 and L5-S1 decompression, instrumentation, and fusion on the patient on 11/12/2016. Postoperatively the patient complained of numbness and weakness in the left foot consistent with a left L5 radiculopathy. She was worked up with a lumbar CT which demonstrated that the left L5 pedicle screw appeared to be compressing the L5 nerve root. I discussed this with the patient and recommended we revise resuscitation. The patient agreed. I perform the surgery on 11/13/2016.  Postoperatively the patient continues to have weakness in her left foot, i.e. a left foot drop. We had PT, OT and rehabilitation see the patient. Arrangements were made for her to go into rehabilitation. We started her on Neurontin for her left leg numbness. The patient also developed some urinary retention which was treated with a Foley catheter.  Consults: PT, OT, rehabilitation Significant Diagnostic Studies: Lumbar CT Treatments: L4-5 and L5-S1 decompression, instrumentation, fusion; revision of lumbar hardware Discharge Exam: Blood pressure 132/67, pulse 81, temperature 98.6 F (37 C), temperature source Oral, resp. rate 20, height 5' 2.5" (1.588 m), weight 90.7 kg (200 lb), SpO2 96 %. The patient is alert and pleasant. She is in no apparent distress. She continues to have a left foot drop. Her lumbar incision is healing well without any drainage.  Disposition: Rehabilitation when a bed is available.  Discharge Instructions    Call MD for:  difficulty breathing, headache or visual disturbances     Complete by:  As directed    Call MD for:  extreme fatigue    Complete by:  As directed    Call MD for:  hives    Complete by:  As directed    Call MD for:  persistant dizziness or light-headedness    Complete by:  As directed    Call MD for:  persistant nausea and vomiting    Complete by:  As directed    Call MD for:  redness, tenderness, or signs of infection (pain, swelling, redness, odor or green/yellow discharge around incision site)    Complete by:  As directed    Call MD for:  severe uncontrolled pain    Complete by:  As directed    Call MD for:  temperature >100.4    Complete by:  As directed    Diet - low sodium heart healthy    Complete by:  As directed    Discharge instructions    Complete by:  As directed    Call [phone] for a followup appointment. Take a stool softener while you are using pain medications.   Driving Restrictions    Complete by:  As directed    Do not drive for 2 weeks.   Increase activity slowly    Complete by:  As directed    Lifting restrictions    Complete by:  As directed    Do not lift more than 5 pounds. No excessive bending or twisting.   May shower / Bathe    Complete by:  As directed    He may shower after the pain she is removed 3 days after surgery. Leave the incision alone.   Remove dressing in 48 hours    Complete by:  As directed    Your stitches are under the scan and will dissolve by themselves. The Steri-Strips will fall off after you take a few showers. Do not rub back or pick at the wound, Leave the wound alone.        SignedOphelia Charter 11/17/2016, 1:15 PM

## 2016-11-18 ENCOUNTER — Inpatient Hospital Stay (HOSPITAL_COMMUNITY)
Admission: RE | Admit: 2016-11-18 | Discharge: 2016-12-02 | DRG: 560 | Disposition: A | Discharge status: Home Health | Payer: PPO | Source: Intra-hospital | Attending: Physical Medicine & Rehabilitation | Admitting: Physical Medicine & Rehabilitation

## 2016-11-18 DIAGNOSIS — G2581 Restless legs syndrome: Secondary | ICD-10-CM

## 2016-11-18 DIAGNOSIS — K219 Gastro-esophageal reflux disease without esophagitis: Secondary | ICD-10-CM | POA: Diagnosis present

## 2016-11-18 DIAGNOSIS — Z880 Allergy status to penicillin: Secondary | ICD-10-CM | POA: Diagnosis not present

## 2016-11-18 DIAGNOSIS — D6852 Prothrombin gene mutation: Secondary | ICD-10-CM | POA: Diagnosis present

## 2016-11-18 DIAGNOSIS — K5900 Constipation, unspecified: Secondary | ICD-10-CM | POA: Diagnosis not present

## 2016-11-18 DIAGNOSIS — R339 Retention of urine, unspecified: Secondary | ICD-10-CM | POA: Diagnosis not present

## 2016-11-18 DIAGNOSIS — T8131XA Disruption of external operation (surgical) wound, not elsewhere classified, initial encounter: Secondary | ICD-10-CM

## 2016-11-18 DIAGNOSIS — Z8673 Personal history of transient ischemic attack (TIA), and cerebral infarction without residual deficits: Secondary | ICD-10-CM | POA: Diagnosis not present

## 2016-11-18 DIAGNOSIS — T887XXA Unspecified adverse effect of drug or medicament, initial encounter: Secondary | ICD-10-CM | POA: Diagnosis not present

## 2016-11-18 DIAGNOSIS — Z6833 Body mass index (BMI) 33.0-33.9, adult: Secondary | ICD-10-CM

## 2016-11-18 DIAGNOSIS — E876 Hypokalemia: Secondary | ICD-10-CM

## 2016-11-18 DIAGNOSIS — Z882 Allergy status to sulfonamides status: Secondary | ICD-10-CM

## 2016-11-18 DIAGNOSIS — G629 Polyneuropathy, unspecified: Secondary | ICD-10-CM | POA: Diagnosis not present

## 2016-11-18 DIAGNOSIS — Z4789 Encounter for other orthopedic aftercare: Secondary | ICD-10-CM | POA: Diagnosis not present

## 2016-11-18 DIAGNOSIS — Z86718 Personal history of other venous thrombosis and embolism: Secondary | ICD-10-CM | POA: Diagnosis not present

## 2016-11-18 DIAGNOSIS — M21372 Foot drop, left foot: Secondary | ICD-10-CM

## 2016-11-18 DIAGNOSIS — Z86711 Personal history of pulmonary embolism: Secondary | ICD-10-CM | POA: Diagnosis not present

## 2016-11-18 DIAGNOSIS — N390 Urinary tract infection, site not specified: Secondary | ICD-10-CM | POA: Insufficient documentation

## 2016-11-18 DIAGNOSIS — M21379 Foot drop, unspecified foot: Secondary | ICD-10-CM | POA: Diagnosis present

## 2016-11-18 DIAGNOSIS — D62 Acute posthemorrhagic anemia: Secondary | ICD-10-CM | POA: Diagnosis not present

## 2016-11-18 DIAGNOSIS — G4701 Insomnia due to medical condition: Secondary | ICD-10-CM

## 2016-11-18 DIAGNOSIS — I10 Essential (primary) hypertension: Secondary | ICD-10-CM

## 2016-11-18 DIAGNOSIS — M792 Neuralgia and neuritis, unspecified: Secondary | ICD-10-CM | POA: Diagnosis not present

## 2016-11-18 DIAGNOSIS — Z885 Allergy status to narcotic agent status: Secondary | ICD-10-CM

## 2016-11-18 DIAGNOSIS — E46 Unspecified protein-calorie malnutrition: Secondary | ICD-10-CM | POA: Diagnosis not present

## 2016-11-18 DIAGNOSIS — I82511 Chronic embolism and thrombosis of right femoral vein: Secondary | ICD-10-CM | POA: Diagnosis not present

## 2016-11-18 DIAGNOSIS — M48062 Spinal stenosis, lumbar region with neurogenic claudication: Secondary | ICD-10-CM | POA: Diagnosis present

## 2016-11-18 DIAGNOSIS — G250 Essential tremor: Secondary | ICD-10-CM | POA: Diagnosis present

## 2016-11-18 DIAGNOSIS — E8809 Other disorders of plasma-protein metabolism, not elsewhere classified: Secondary | ICD-10-CM

## 2016-11-18 DIAGNOSIS — I82401 Acute embolism and thrombosis of unspecified deep veins of right lower extremity: Secondary | ICD-10-CM

## 2016-11-18 DIAGNOSIS — R8299 Other abnormal findings in urine: Secondary | ICD-10-CM | POA: Diagnosis not present

## 2016-11-18 DIAGNOSIS — B9620 Unspecified Escherichia coli [E. coli] as the cause of diseases classified elsewhere: Secondary | ICD-10-CM | POA: Diagnosis not present

## 2016-11-18 DIAGNOSIS — R8290 Unspecified abnormal findings in urine: Secondary | ICD-10-CM | POA: Diagnosis not present

## 2016-11-18 DIAGNOSIS — R609 Edema, unspecified: Secondary | ICD-10-CM | POA: Diagnosis not present

## 2016-11-18 MED ORDER — ALUM & MAG HYDROXIDE-SIMETH 200-200-20 MG/5ML PO SUSP: 30.0000 mL | ORAL | Status: DC | PRN

## 2016-11-18 MED ORDER — POLYETHYLENE GLYCOL 3350 17 G PO PACK
17.0000 g | PACK | Freq: Every day | ORAL | Status: DC | PRN
  Filled 2016-11-18: qty 1

## 2016-11-18 MED ORDER — ADULT MULTIVITAMIN W/MINERALS CH
1.0000 | ORAL_TABLET | Freq: Every day | ORAL | Status: DC
  Administered 2016-11-19 – 2016-12-02 (×14): 1 via ORAL
  Filled 2016-11-18 (×14): qty 1

## 2016-11-18 MED ORDER — OXYCODONE HCL 5 MG PO TABS
5.0000 mg | ORAL_TABLET | ORAL | Status: DC | PRN
  Administered 2016-11-19: 10 mg via ORAL
  Administered 2016-11-20: 5 mg via ORAL
  Administered 2016-11-20 – 2016-11-22 (×3): 10 mg via ORAL
  Administered 2016-11-23: 5 mg via ORAL
  Administered 2016-11-23 – 2016-11-24 (×3): 10 mg via ORAL
  Filled 2016-11-18 (×2): qty 2
  Filled 2016-11-18: qty 1
  Filled 2016-11-18: qty 2
  Filled 2016-11-18: qty 1
  Filled 2016-11-18 (×5): qty 2
  Filled 2016-11-18: qty 1

## 2016-11-18 MED ORDER — MAGIC MOUTHWASH W/LIDOCAINE
10.0000 mL | Freq: Four times a day (QID) | ORAL | Status: DC
  Administered 2016-11-18 – 2016-11-28 (×32): 10 mL via ORAL
  Filled 2016-11-18 (×53): qty 10

## 2016-11-18 MED ORDER — HYDROCHLOROTHIAZIDE 25 MG PO TABS
25.0000 mg | ORAL_TABLET | Freq: Every day | ORAL | Status: DC
  Administered 2016-11-19 – 2016-12-02 (×13): 25 mg via ORAL
  Filled 2016-11-18 (×13): qty 1

## 2016-11-18 MED ORDER — PROPRANOLOL HCL 10 MG PO TABS
10.0000 mg | ORAL_TABLET | Freq: Two times a day (BID) | ORAL | Status: DC
  Administered 2016-11-18 – 2016-11-28 (×21): 10 mg via ORAL
  Filled 2016-11-18 (×22): qty 1

## 2016-11-18 MED ORDER — GABAPENTIN 600 MG PO TABS
300.0000 mg | ORAL_TABLET | Freq: Three times a day (TID) | ORAL | Status: DC
Start: 2016-11-18 — End: 2016-11-20
  Administered 2016-11-18 – 2016-11-19 (×3): 300 mg via ORAL
  Filled 2016-11-18 (×4): qty 1

## 2016-11-18 MED ORDER — TRAZODONE HCL 50 MG PO TABS
25.0000 mg | ORAL_TABLET | Freq: Every evening | ORAL | Status: DC | PRN
  Administered 2016-11-18 (×2): 50 mg via ORAL
  Filled 2016-11-18: qty 1

## 2016-11-18 MED ORDER — GUAIFENESIN-DM 100-10 MG/5ML PO SYRP: 5.0000 mL | ORAL_SOLUTION | Freq: Four times a day (QID) | ORAL | Status: DC | PRN

## 2016-11-18 MED ORDER — AMLODIPINE BESYLATE 5 MG PO TABS
5.0000 mg | ORAL_TABLET | Freq: Every day | ORAL | Status: DC
  Administered 2016-11-19 – 2016-12-02 (×14): 5 mg via ORAL
  Filled 2016-11-18 (×14): qty 1

## 2016-11-18 MED ORDER — ACETAMINOPHEN 325 MG PO TABS
325.0000 mg | ORAL_TABLET | ORAL | Status: DC | PRN
  Administered 2016-11-18 – 2016-11-20 (×5): 650 mg via ORAL
  Administered 2016-11-20: 325 mg via ORAL
  Filled 2016-11-18 (×6): qty 2

## 2016-11-18 MED ORDER — CITALOPRAM HYDROBROMIDE 20 MG PO TABS
20.0000 mg | ORAL_TABLET | Freq: Every day | ORAL | Status: DC
  Administered 2016-11-18 – 2016-12-01 (×14): 20 mg via ORAL
  Filled 2016-11-18 (×14): qty 1

## 2016-11-18 MED ORDER — OXYCODONE HCL 5 MG PO TABS: 5.0000 mg | ORAL_TABLET | Freq: Four times a day (QID) | ORAL | Status: DC | PRN

## 2016-11-18 MED ORDER — BISACODYL 10 MG RE SUPP: 10.0000 mg | Freq: Every day | RECTAL | Status: DC | PRN

## 2016-11-18 MED ORDER — PRAMIPEXOLE DIHYDROCHLORIDE 0.25 MG PO TABS
0.5000 mg | ORAL_TABLET | Freq: Every evening | ORAL | Status: DC
  Administered 2016-11-18 – 2016-12-01 (×14): 0.5 mg via ORAL
  Filled 2016-11-18 (×15): qty 2

## 2016-11-18 MED ORDER — POLYVINYL ALCOHOL 1.4 % OP SOLN
1.0000 [drp] | OPHTHALMIC | Status: DC | PRN
  Filled 2016-11-18: qty 15

## 2016-11-18 MED ORDER — PROCHLORPERAZINE MALEATE 5 MG PO TABS: 5.0000 mg | ORAL_TABLET | Freq: Four times a day (QID) | ORAL | Status: DC | PRN

## 2016-11-18 MED ORDER — DIPHENHYDRAMINE HCL 12.5 MG/5ML PO ELIX: 12.5000 mg | ORAL_SOLUTION | Freq: Four times a day (QID) | ORAL | Status: DC | PRN

## 2016-11-18 MED ORDER — COLCHICINE 0.6 MG PO TABS
0.6000 mg | ORAL_TABLET | Freq: Every day | ORAL | Status: DC
  Administered 2016-11-19 – 2016-12-02 (×13): 0.6 mg via ORAL
  Filled 2016-11-18 (×14): qty 1

## 2016-11-18 MED ORDER — TRAMADOL HCL 50 MG PO TABS
50.0000 mg | ORAL_TABLET | Freq: Four times a day (QID) | ORAL | Status: DC | PRN
  Administered 2016-11-19 – 2016-12-01 (×4): 50 mg via ORAL
  Filled 2016-11-18 (×4): qty 1

## 2016-11-18 MED ORDER — PANTOPRAZOLE SODIUM 40 MG PO TBEC
40.0000 mg | DELAYED_RELEASE_TABLET | Freq: Every day | ORAL | Status: DC
  Administered 2016-11-18 – 2016-12-01 (×14): 40 mg via ORAL
  Filled 2016-11-18 (×14): qty 1

## 2016-11-18 MED ORDER — FLEET ENEMA 7-19 GM/118ML RE ENEM: 1.0000 | ENEMA | Freq: Once | RECTAL | Status: DC | PRN

## 2016-11-18 MED ORDER — PROCHLORPERAZINE EDISYLATE 5 MG/ML IJ SOLN: 5.0000 mg | Freq: Four times a day (QID) | INTRAMUSCULAR | Status: DC | PRN

## 2016-11-18 MED ORDER — PROCHLORPERAZINE 25 MG RE SUPP
12.5000 mg | Freq: Four times a day (QID) | RECTAL | Status: DC | PRN
  Filled 2016-11-18: qty 1

## 2016-11-18 MED ORDER — TEMAZEPAM 15 MG PO CAPS: 15.0000 mg | ORAL_CAPSULE | Freq: Every evening | ORAL | Status: DC | PRN

## 2016-11-18 MED ORDER — SENNOSIDES-DOCUSATE SODIUM 8.6-50 MG PO TABS
2.0000 | ORAL_TABLET | Freq: Two times a day (BID) | ORAL | Status: DC
  Administered 2016-11-18 – 2016-12-02 (×16): 2 via ORAL
  Filled 2016-11-18 (×29): qty 2

## 2016-11-18 MED ORDER — CYCLOSPORINE 0.05 % OP EMUL
1.0000 [drp] | Freq: Two times a day (BID) | OPHTHALMIC | Status: DC
  Administered 2016-11-18 – 2016-12-02 (×28): 1 [drp] via OPHTHALMIC
  Filled 2016-11-18 (×28): qty 1

## 2016-11-18 NOTE — Progress Notes (Signed)
Rehab admissions - I have approval from [facility] to admit to acute inpatient rehab today.  Bed available and will admit to inpatient rehab today.  Call me for questions.  #[phone]

## 2016-11-18 NOTE — Care Management Note (Signed)
Case Management Note  Patient Details  Name: KELIE GAINEY MRN: [id number] Date of Birth: 20-Mar-1941  Subjective/Objective:                    Action/Plan: Pt discharging to CIR today. No further needs per CM.   Expected Discharge Date:  11/17/16               Expected Discharge Plan:  [place]  In-House Referral:  NA  Discharge planning Services  CM Consult  Post Acute Care Choice:  Durable Medical Equipment, Home Health Choice offered to:     DME Arranged:    DME Agency:     HH Arranged:    [place] Agency:     Status of Service:  Completed, signed off  If discussed at [employer], dates discussed:    Additional Comments:  Pollie Friar, RN 11/18/2016, 1:21 PM

## 2016-11-18 NOTE — Progress Notes (Signed)
Patient ID: [name], female   DOB: [birth year], 76 y.o.   MRN: [id number] Admit to unit from 5 m, oriented to unit, rehab schedule and routine. Reviewed orders, medications and therapy schedule. Reviewed back precautions, brace wearing and bladder management plan. States an understanding of information reviewed. Margarito Liner

## 2016-11-18 NOTE — H&P (Signed)
Physical Medicine and Rehabilitation Admission H&P    CC: Lumbar spondylolisthesis s/p decompression with hardware failure/foot drop and urinary retention.    HPI:  [name] is a 76 y.o. female with history of HTN, PE/DVT, multiple back surgeries who developed recurrent pain radiating to RLE due to L4/5 and L5-S1 spondylolisthesis with recurrent stenosis L4, L5 and S1 nerve roots causing neurogenic claudication. She elected to undergo redo L4/5 and L5-S1 decompressive laminectomy with nerve root decompression on 11/12/16 by Dr. Arnoldo Morale. Post op with LLE numbness with EHL weakness due to displacement of L5 pedicle screw. She was taken back to OR on 7/19 for revision of lumbar instrumentation and AFO ordered for Left foot drop.  Neuropathy improved with addition of neurontin. She has had issues with urinary retention with volumes from 600 cc- 1000 cc and foley replaced yesterday. Therapy ongoing and patient with significant deficits in mobility and CIR recommended by rehab team.    Review of Systems  HENT: Positive for hearing loss. Negative for tinnitus.   Eyes: Negative for blurred vision and double vision.  Respiratory: Negative for cough and shortness of breath.   Cardiovascular: Negative for chest pain and palpitations.  Gastrointestinal: Positive for constipation, heartburn and nausea.  Genitourinary: Negative for dysuria and urgency.  Musculoskeletal: Negative for myalgias.  Skin: Negative for rash.  Neurological: Positive for dizziness (on and off since last year), sensory change (LLE heaviness), focal weakness and weakness. Negative for headaches.  Psychiatric/Behavioral: Positive for depression. The patient has insomnia.       Past Medical History:  Diagnosis Date  . Anemia   . Arthritis   . Bell's palsy   . GERD (gastroesophageal reflux disease)   . Hemochromatosis   . History of hiatal hernia   . Hypertension   . PE (pulmonary embolism)   . Pneumonia    "walking" pneumonia  . Prothrombin gene mutation ([place]) 05/30/2013  . Restless legs   . Right leg DVT ([place]) 05/30/2013  . Stroke [facility])    found on a MRI, she's not aware otherwise    Past Surgical History:  Procedure Laterality Date  . BACK SURGERY    . COLONOSCOPY    . SHOULDER SURGERY Bilateral     Family History  Problem Relation Age of Onset  . Congestive Heart Failure Mother   . Pancreatic cancer Father     Social History:    Allergies  Allergen Reactions  . Penicillins Anaphylaxis    Pt was 62 or 76 years old  Has patient had a PCN reaction causing immediate rash, facial/tongue/throat swelling, SOB or lightheadedness with hypotension: Yes Has patient had a PCN reaction causing severe rash involving mucus membranes or skin necrosis: Unknown Has patient had a PCN reaction that required hospitalization: No Has patient had a PCN reaction occurring within the last 10 years: No If all of the above answers are "NO", then may proceed with Cephalosporin use.   . Sulfa Antibiotics Other (See Comments)    unknown  . Morphine And Related Other (See Comments)    Twitching in large doses     Medications Prior to Admission  Medication Sig Dispense Refill  . amLODipine (NORVASC) 5 MG tablet Take 5 mg by mouth daily.     Marland Kitchen aspirin EC 81 MG tablet Take 162 mg by mouth daily.    . Biotin 5000 MCG TABS Take 5,000 mcg by mouth daily.     Marland Kitchen Black Cohosh (REMIFEMIN PO) Take 1 tablet  by mouth 2 (two) times daily.    . Calcium Carbonate-Vitamin D (CALCIUM 600+D PO) Take 1 tablet by mouth 2 (two) times daily.    . Cholecalciferol (VITAMIN D) 2000 UNITS tablet Take 2,000 Units by mouth 2 (two) times daily.    . citalopram (CELEXA) 20 MG tablet Take 20 mg by mouth at bedtime.     . fish oil-omega-3 fatty acids 1000 MG capsule Take 1 g by mouth daily.     . hydrochlorothiazide (HYDRODIURIL) 25 MG tablet Take 25 mg by mouth daily.    . Hypromellose (ARTIFICIAL TEARS OP) Place 1 drop into both  eyes 3 (three) times daily as needed (dry eyes).    . Multiple Vitamin (MULTIVITAMIN WITH MINERALS) TABS tablet Take 1 tablet by mouth daily.    Marland Kitchen oxyCODONE-acetaminophen (PERCOCET/ROXICET) 5-325 MG tablet Take 1 tablet by mouth 3 (three) times daily as needed for moderate pain.     . pantoprazole (PROTONIX) 40 MG tablet Take 40 mg by mouth daily before supper.    . pramipexole (MIRAPEX) 0.5 MG tablet Take 0.5 mg by mouth every evening.    . propranolol (INDERAL) 10 MG tablet Take 10 mg by mouth 2 (two) times daily.     . RESTASIS 0.05 % ophthalmic emulsion Place 1 drop into both eyes 2 (two) times daily.    . temazepam (RESTORIL) 15 MG capsule Take 2 capsules (30 mg total) by mouth at bedtime. (Patient taking differently: Take 15 mg by mouth at bedtime. ) 30 capsule 3  . Turmeric Curcumin 500 MG CAPS Take 500 mg by mouth at bedtime.     . vitamin E 400 UNIT capsule Take 400 Units by mouth daily.    . colchicine 0.6 MG tablet Take 1 tablet by mouth daily as needed. Gout flare up.    . cyclobenzaprine (FLEXERIL) 10 MG tablet Take 1 tablet (10 mg total) by mouth 2 (two) times daily as needed for muscle spasms. 12 tablet 0  . HYDROcodone-acetaminophen (NORCO/VICODIN) 5-325 MG tablet Take 1 tablet by mouth every 6 (six) hours as needed. (Patient not taking: Reported on 08/27/2016) 10 tablet 0  . methocarbamol (ROBAXIN) 500 MG tablet Take 1 tablet (500 mg total) by mouth 2 (two) times daily. (Patient not taking: Reported on 10/30/2016) 20 tablet 0    Home: [place] expects to be discharged to:: Private residence Living Arrangements: Spouse/significant other Available Help at Discharge: Family Type of Home: House Home Access: Stairs to enter Technical brewer of Steps: 2 Home Layout: One level Bathroom Shower/Tub: Tub/shower unit, Walk-in shower Home Equipment: Environmental consultant - 2 wheels, [employer] - single point, Industrial/product designer History: Prior Function Level of Independence:  Independent  Functional Status:  Mobility: Bed Mobility Overal bed mobility: Needs Assistance Bed Mobility: Rolling, Sidelying to Sit, Sit to Sidelying Rolling: Min assist Sidelying to sit: Mod assist Sit to sidelying: Mod assist General bed mobility comments: assist to elevate trunk to upright and for positioning of LEs in bed Transfers Overall transfer level: Needs assistance Equipment used: Rolling walker (2 wheeled) Transfers: Sit to/from Stand, Stand Pivot Transfers Sit to Stand: Min assist Stand pivot transfers: Min assist General transfer comment: Vcs for hand placement, min assist to standing, reports discomfort and pain in LLE (foot) Ambulation/Gait Ambulation/Gait assistance: Mod assist Ambulation Distance (Feet): 4 Feet Assistive device: Rolling walker (2 wheeled) Gait Pattern/deviations: Step-to pattern General Gait Details: awaiting left AFO;  for foot drop/lateral ankle stability. Attempted side steps to [facility]  tolerate but limited significantly with LLE    ADL:    Cognition: Cognition Overall Cognitive Status: Within Functional Limits for tasks assessed Orientation Level: Oriented X4 Cognition Arousal/Alertness: Awake/alert Behavior During Therapy: WFL for tasks assessed/performed Overall Cognitive Status: Within Functional Limits for tasks assessed Area of Impairment: Memory, Following commands, Safety/judgement, Problem solving Memory: Decreased recall of precautions, Decreased short-term memory Following Commands: Follows one step commands consistently Safety/Judgement: Decreased awareness of safety, Decreased awareness of deficits Problem Solving: Difficulty sequencing, Requires verbal cues General Comments: no issues with cogntion evident this session   Blood pressure 132/82, pulse 92, temperature 99.1 F (37.3 C), temperature source Oral, resp. rate 20, height 5' 2.5" (1.588 m), weight 90.7 kg (200 lb), SpO2 98 %. Physical Exam  Nursing note and  vitals reviewed. Constitutional: She is oriented to person, place, and time. She appears well-developed and well-nourished.  HENT:  Head: Normocephalic and atraumatic.  Eyes: Pupils are equal, round, and reactive to light. Conjunctivae are normal.  Neck: Normal range of motion. Neck supple.  Cardiovascular: Normal rate and regular rhythm.   Respiratory: Effort normal and breath sounds normal. No stridor. No respiratory distress. She has no wheezes.  GI: Soft. Bowel sounds are normal. She exhibits no distension. There is no tenderness.  Musculoskeletal: She exhibits no edema or tenderness.  Neurological: She is alert and oriented to person, place, and time. No cranial nerve deficit.  Speech clear. LLE weakness with foot drop. UE motor 5/5. RLE: 4- to 4/5 prox to distal. LLE: HE/HAD 1-2/5, KE 3/5, ADF tr, APF 2/5. Decreased LT along left leg from thigh to foot, more pronounces along lateral leg.   Skin: Skin is warm and dry.  Dry dressing on back incision  Psychiatric: She has a normal mood and affect. Her behavior is normal. Judgment and thought content normal.    No results found for this or any previous visit (from the past 48 hour(s)). No results found.     Medical Problem List and Plan: 1.  Left lower extremity weakness and functional deficits secondary to lumbar stenosis/L4-S1 radiculopathy s/p lumbar decompression and fusion (with re-do)  -admit to inpatient rehab  -left carbon AFO delivered by [employer]  -PRAFO to support LLE while in bed 2.  H/o PE/DVT Prophylaxis/Anticoagulation: Mechanical: Sequential compression devices, below knee Bilateral lower extremities Check dopplers in am.  3. Pain Management:  Continue oxycodone with flexeril prn.   4. Mood: LCSW to follow for evaluation and support.  5. Neuropsych: This patient is capable of making decisions on  her own behalf. 6. Skin/Wound Care: Monitor wound for healing.  7. Fluids/Electrolytes/Nutrition: Monitor I/O. Check lytes  in am.  8. HTN: Monitor BP bid. On Norvasc, Inderal and HCTZ 9.  Hemochromatosis:  Stable. Has not had to be phlebotomized in 6 years.  10. Restless leg syndrome: On Mirapex 11. Urinary retention: Remove foley in am and start bladder training. Check UA/UCS.  12. Constipation: Increase senna to bid.   Post Admission Physician Evaluation: 1. Functional deficits secondary  to lumbar stenosis, left lumbar-sacral radiculopsthy. 2. Patient is admitted to receive collaborative, interdisciplinary care between the physiatrist, rehab nursing staff, and therapy team. 3. Patient's level of medical complexity and substantial therapy needs in context of that medical necessity cannot be provided at a lesser intensity of care such as a SNF. 4. Patient has experienced substantial functional loss from his/her baseline which was documented above under the "Functional History" and "Functional Status" headings.  Judging by the patient's diagnosis,  physical exam, and functional history, the patient has potential for functional progress which will result in measurable gains while on inpatient rehab.  These gains will be of substantial and practical use upon discharge  in facilitating mobility and self-care at the household level. 5. Physiatrist will provide 24 hour management of medical needs as well as oversight of the therapy plan/treatment and provide guidance as appropriate regarding the interaction of the two. 6. The Preadmission Screening has been reviewed and patient status is unchanged unless otherwise stated above. 7. 24 hour rehab nursing will assist with bladder management, bowel management, safety, skin/wound care, disease management, medication administration, pain management and patient education  and help integrate therapy concepts, techniques,education, etc. 8. PT will assess and treat for/with: Lower extremity strength, range of motion, stamina, balance, functional mobility, safety, adaptive techniques and  equipment, NMR, orthotic use, family education, pain mgt.   Goals are: mod I. 9. OT will assess and treat for/with: ADL's, functional mobility, safety, upper extremity strength, adaptive techniques and equipment, NMR, pain mgt, family ed.   Goals are: mod I. Therapy may proceed with showering this patient. 10. SLP will assess and treat for/with: n/a.  Goals are: n/a. 11. Case Management and Social Worker will assess and treat for psychological issues and discharge planning. 12. Team conference will be held weekly to assess progress toward goals and to determine barriers to discharge. 13. Patient will receive at least 3 hours of therapy per day at least 5 days per week. 14. ELOS: 11-14 days       15. Prognosis:  excellent     Meredith Staggers, MD, [place] [facility] 11/18/2016  Bary Leriche, Hershal Coria 11/18/2016

## 2016-11-18 NOTE — Progress Notes (Signed)
Patient ID: [name], female   DOB: [birth year], 76 y.o.   MRN: [id number] Subjective:  The patient is alert and pleasant. She looks and feels better. She says her left leg and foot numbness is better since starting the Neurontin. Her daughter is at the bedside.  Objective: Vital signs in last 24 hours: Temp:  [97.9 F (36.6 C)-99.1 F (37.3 C)] 99.1 F (37.3 C) (07/24 0949) Pulse Rate:  [69-92] 92 (07/24 0949) Resp:  [20] 20 (07/24 0949) BP: (119-158)/(54-82) 132/82 (07/24 0949) SpO2:  [93 %-98 %] 98 % (07/24 0949)  Intake/Output from previous day: 07/23 0701 - 07/24 0700 In: 1320 [P.O.:1320] Out: 2275 [Urine:2275] Intake/Output this shift: Total I/O In: 360 [P.O.:360] Out: -   Physical exam the patient is alert and pleasant. Her dressing is clean and dry. The patient continues to have left foot drop without change.  Lab Results: No results for input(s): WBC, HGB, HCT, PLT in the last 72 hours. BMET No results for input(s): NA, K, CL, CO2, GLUCOSE, BUN, CREATININE, CALCIUM in the last 72 hours.  Studies/Results: No results found.  Assessment/Plan: Postop day #6: We are awaiting rehabilitation placement. I have answered all their questions.  LOS: 6 days     [name] D 11/18/2016, 9:51 AM

## 2016-11-18 NOTE — IPOC Note (Signed)
Overall Plan of Care [facility]) Patient Details Name: [name] MRN: [id number] DOB: 13-Jun-1940  Admitting Diagnosis: lumbar fusion  Hospital Problems: Active Problems:   Lumbar stenosis with neurogenic claudication   History of DVT (deep vein thrombosis)   Benign essential HTN   RLS (restless legs syndrome)   Urinary retention   Hypokalemia   Hypoalbuminemia due to protein-calorie malnutrition (HCC)   Acute blood loss anemia     Functional Problem List: Nursing Bladder, Bowel, Pain, Endurance, Medication Management, Motor, Edema, Safety  PT Balance, Pain, Endurance, Motor, Sensory  OT Balance, Motor, Pain, Safety, Endurance  SLP    TR         Basic ADL's: OT Grooming, Bathing, Dressing, Toileting     Advanced  ADL's: OT Simple Meal Preparation     Transfers: PT Bed Mobility, Bed to Chair, Car, Furniture, Floor  OT Toilet, Metallurgist: PT Ambulation, Emergency planning/management officer, Stairs     Additional Impairments: OT    SLP        TR      Anticipated Outcomes Item Anticipated Outcome  Self Feeding independent  Swallowing      Basic self-care  supervision  Toileting  supervision   Bathroom Transfers supervision  Bowel/Bladder  manage bladder with min assist and bowel with mod I assist  Transfers  Supervision  Locomotion  contact/touching assist   Communication     Cognition     Pain  Pain at or below level 4  Safety/Judgment  maintain safety with cues/reminders of precautions   Therapy Plan: PT Intensity: Minimum of 1-2 x/day ,45 to 90 minutes PT Frequency: 5 out of 7 days PT Duration Estimated Length of Stay: 10-14 OT Intensity: Minimum of 1-2 x/day, 45 to 90 minutes OT Frequency: 5 out of 7 days OT Duration/Estimated Length of Stay: 12-14 days         Team Interventions: Nursing Interventions Patient/Family Education, Disease Management/Prevention, Skin Care/Wound Management, Discharge Planning, Bladder Management, Bowel  Management, Pain Management, Medication Management  PT interventions Ambulation/gait training, Community reintegration, Training and development officer, Discharge planning, Disease management/prevention, DME/adaptive equipment instruction, Functional electrical stimulation, Functional mobility training, Neuromuscular re-education, Pain management, Patient/family education, Psychosocial support, Skin care/wound management, Splinting/orthotics, Therapeutic Activities, Therapeutic Exercise, UE/LE Strength taining/ROM, UE/LE Coordination activities, Wheelchair propulsion/positioning  OT Interventions Training and development officer, Academic librarian, Discharge planning, DME/adaptive equipment instruction, Neuromuscular re-education, Functional mobility training, Pain management, Patient/family education, Splinting/orthotics, Self Care/advanced ADL retraining, Therapeutic Activities, UE/LE Strength taining/ROM, UE/LE Coordination activities, Therapeutic Exercise, Wheelchair propulsion/positioning  SLP Interventions    TR Interventions    SW/CM Interventions Discharge Planning, Psychosocial Support, Patient/Family Education    Team Discharge Planning: Destination: PT-Home ,OT- Home , SLP-  Projected Follow-up: PT-Outpatient PT, OT-  Home health OT, 24 hour supervision/assistance, SLP-  Projected Equipment Needs: PT-To be determined, OT- To be determined, SLP-  Equipment Details: PT- , OT-  Patient/family involved in discharge planning: PT- Patient,  OT-Patient, SLP-   MD ELOS: 10-14 days. Medical Rehab Prognosis:  Good Assessment:  [name] is a 76 y.o. female with history of HTN, PE/DVT, multiple back surgeries who developed recurrent pain radiating to RLE due to L4/5 and L5-S1 spondylolisthesis with recurrent stenosis L4, L5 and S1 nerve roots causing neurogenic claudication. She elected to undergo redo L4/5 and L5-S1 decompressive laminectomy with nerve root decompression on 11/12/16 by Dr.  Arnoldo Morale. Post op with LLE numbness with EHL weakness due to displacement of L5 pedicle screw. She was taken  back to OR on 7/19 for revision of lumbar instrumentation and AFO ordered for Left foot drop. Neuropathy improved with addition of neurontin. She has had issues with urinary retention with volumes from 600 cc- 1000 cc and foley replaced. Pt with resulting functional deficits with mobility, endurance. Will set goals supervision with PT/OT.   See Team Conference Notes for weekly updates to the plan of care

## 2016-11-18 NOTE — Progress Notes (Signed)
Retta Diones, RN Rehab Admission Coordinator Signed Physical Medicine and Rehabilitation  PMR Pre-admission Date of Service: 11/18/2016 12:47 PM  Related encounter: Admission (Current) from 11/12/2016 in [place]       '[]' Hide copied text PMR Admission Coordinator Pre-Admission Assessment  Patient: [name] is an 76 y.o., female MRN: [id number] DOB: 13-Jun-1940 Height: 5' 2.5" (158.8 cm) Weight: 90.7 kg (200 lb)                                                                                                                                                  Insurance Information HMO:     PPO:       PCP:       IPA:       80/20:       OTHER:  Group # [id number] PRIMARY: [employer]      Policy#: [id number]      Subscriber:  [name] CM Name: Janey Genta      Phone#: [phone]     Fax#:  Has EPIC access Pre-Cert#: [id number]      Employer:  Retired Benefits:  Phone #: [phone]     Name:  Randol Kern. Date: 04/28/14     Deduct:  $0      Out of Pocket Max: $3400 (met $550.99)      Life Max: N/A CIR: $250 days 1-6      SNF:  $40 days 1-20; $160 days 21-100 Outpatient:  Medical necessity     Co-Pay: $40/visit Home Health: With authorization      Co-Pay: $25/visit DME: 80%     Co-Pay: 20% Providers: in network  Emergency [place]    Name Relation Home Work [place] Spouse [phone]  [phone]   Davis,Sandy Daughter  [phone]      Current Medical History  Patient Admitting Diagnosis:  Lumbar spondylolisthesis status post fusion with left L5 radiculopathy causing foot drop, postoperative urinary retention  History of Present Illness: A 76 y.o.femalewith history of HTN, PE/DVT, multiple back surgeries who developed recurrent pain radiating to RLE due to L4/5 and L5-S1 spondylolisthesis with recurrent stenosis L4, L5 and S1 nerve roots causing neurogenic  claudication. She elected to undergo redo L4/5 and L5-S1 decompressive laminectomy with nerve root decompression on 11/12/16 by Dr. Arnoldo Morale. Post op with LLE numbness with EHL weakness due to displacement of L5 pedicle screw. She was taken back to OR on 7/19 for revision of lumbar instrumentation and AFO ordered for Left foot drop. Therapy ongoing and patient with significant deficits in mobility and CIR recommended by rehab team.    Past Medical History      Past Medical History:  Diagnosis Date  . Anemia   . Arthritis   . Bell's palsy   .  GERD (gastroesophageal reflux disease)   . Hemochromatosis   . History of hiatal hernia   . Hypertension   . PE (pulmonary embolism)   . Pneumonia    "walking" pneumonia  . Prothrombin gene mutation ([place]) 05/30/2013  . Restless legs   . Right leg DVT ([place]) 05/30/2013  . Stroke [facility])    found on a MRI, she's not aware otherwise    Family History  family history includes Congestive Heart Failure in her mother; Pancreatic cancer in her father.  Prior Rehab/Hospitalizations: No previous rehab.  Has the patient had major surgery during 100 days prior to admission? No  Current Medications   Current Facility-Administered Medications:  .  0.9 %  sodium chloride infusion, 250 mL, Intravenous, Continuous, Newman Pies, MD .  acetaminophen (TYLENOL) tablet 650 mg, 650 mg, Oral, Q4H PRN, 650 mg at 11/18/16 0910 **OR** acetaminophen (TYLENOL) suppository 650 mg, 650 mg, Rectal, Q4H PRN, Newman Pies, MD .  amLODipine (NORVASC) tablet 5 mg, 5 mg, Oral, Daily, Newman Pies, MD, 5 mg at 11/18/16 0910 .  bisacodyl (DULCOLAX) suppository 10 mg, 10 mg, Rectal, Daily PRN, Newman Pies, MD .  citalopram (CELEXA) tablet 20 mg, 20 mg, Oral, QHS, Newman Pies, MD, 20 mg at 11/17/16 2128 .  colchicine tablet 0.6 mg, 0.6 mg, Oral, Daily, Newman Pies, MD, 0.6 mg at 11/18/16 0910 .  cyclobenzaprine (FLEXERIL) tablet 10 mg, 10 mg,  Oral, TID PRN, Newman Pies, MD, 10 mg at 11/17/16 0916 .  cycloSPORINE (RESTASIS) 0.05 % ophthalmic emulsion 1 drop, 1 drop, Both Eyes, BID, Newman Pies, MD, 1 drop at 11/18/16 0910 .  docusate sodium (COLACE) capsule 100 mg, 100 mg, Oral, BID, Newman Pies, MD, 100 mg at 11/18/16 0910 .  gabapentin (NEURONTIN) tablet 300 mg, 300 mg, Oral, TID, Newman Pies, MD, 300 mg at 11/18/16 0910 .  hydrochlorothiazide (HYDRODIURIL) tablet 25 mg, 25 mg, Oral, Daily, Newman Pies, MD, 25 mg at 11/18/16 0910 .  lactated ringers infusion, , Intravenous, Continuous, Roberts Gaudy, MD, Last Rate: 50 mL/hr at 11/12/16 1231 .  menthol-cetylpyridinium (CEPACOL) lozenge 3 mg, 1 lozenge, Oral, PRN **OR** phenol (CHLORASEPTIC) mouth spray 1 spray, 1 spray, Mouth/Throat, PRN, Newman Pies, MD .  morphine 4 MG/ML injection 4 mg, 4 mg, Intravenous, Q2H PRN, Newman Pies, MD, 4 mg at 11/15/16 0313 .  multivitamin with minerals tablet 1 tablet, 1 tablet, Oral, Daily, Newman Pies, MD, 1 tablet at 11/18/16 0910 .  ondansetron (ZOFRAN) tablet 4 mg, 4 mg, Oral, Q6H PRN **OR** ondansetron (ZOFRAN) injection 4 mg, 4 mg, Intravenous, Q6H PRN, Newman Pies, MD .  oxyCODONE (Oxy IR/ROXICODONE) immediate release tablet 5-10 mg, 5-10 mg, Oral, Q3H PRN, Newman Pies, MD, 10 mg at 11/17/16 2001 .  pantoprazole (PROTONIX) EC tablet 40 mg, 40 mg, Oral, QAC supper, Newman Pies, MD, 40 mg at 11/17/16 1755 .  polyvinyl alcohol (LIQUIFILM TEARS) 1.4 % ophthalmic solution 1 drop, 1 drop, Both Eyes, PRN, Newman Pies, MD .  pramipexole (MIRAPEX) tablet 0.5 mg, 0.5 mg, Oral, QPM, Newman Pies, MD, 0.5 mg at 11/17/16 1755 .  propranolol (INDERAL) tablet 10 mg, 10 mg, Oral, BID, Newman Pies, MD, 10 mg at 11/18/16 0910 .  senna (SENOKOT) tablet 17.2 mg, 2 tablet, Oral, Daily, Newman Pies, MD, 17.2 mg at 11/18/16 0910 .  sodium chloride flush (NS) 0.9 % injection 3 mL, 3 mL, Intravenous,  Q12H, Newman Pies, MD, 3 mL at 11/17/16 0917 .  sodium chloride flush (NS) 0.9 % injection  3 mL, 3 mL, Intravenous, PRN, Newman Pies, MD .  temazepam (RESTORIL) capsule 15 mg, 15 mg, Oral, QHS PRN, Newman Pies, MD  Patients Current Diet: Diet regular Room service appropriate? Yes; Fluid consistency: Thin Diet - low sodium heart healthy  Precautions / Restrictions Precautions Precautions: Back, Fall Precaution Booklet Issued: No Precaution Comments: Pt able to recall 3/3 back precautions without cues.  Spinal Brace: Lumbar corset, Applied in sitting position Other Brace/Splint: left AFO, ill-fitting, awaiting new brace from [employer]; foot drop and ankle rolls into eversion Restrictions Weight Bearing Restrictions: No   Has the patient had 2 or more falls or a fall with injury in the past year?No  Prior Activity Level Limited Community (1-2x/wk): Went out 3 X a week, was driving.  Home Assistive Devices / Equipment Home Assistive Devices/Equipment: Eyeglasses, Radio producer (specify quad or straight), Walker (specify type) Home Equipment: Walker - 2 wheels, Cane - single point, Shower seat  Prior Device Use: Indicate devices/aids used by the patient prior to current illness, exacerbation or injury? Walker and [employer]  Prior Functional Level Prior Function Level of Independence: Independent  Self Care: Did the patient need help bathing, dressing, using the toilet or eating?  Independent  Indoor Mobility: Did the patient need assistance with walking from room to room (with or without device)? Independent  Stairs: Did the patient need assistance with internal or external stairs (with or without device)? Independent  Functional Cognition: Did the patient need help planning regular tasks such as shopping or remembering to take medications? Independent  Current Functional Level Cognition  Overall Cognitive Status: Within Functional Limits for tasks  assessed Orientation Level: Oriented X4 Following Commands: Follows one step commands consistently Safety/Judgement: Decreased awareness of safety, Decreased awareness of deficits General Comments: no issues with cogntion evident this session    Extremity Assessment (includes Sensation/Coordination)  Upper Extremity Assessment: Overall WFL for tasks assessed  Lower Extremity Assessment: LLE deficits/detail LLE Deficits / Details: pt with decreased strength throughout; MMT revealed 2/5 for hip flexion, knee extension, knee flexion and ankle DF. Sensation is diminished to light touch throughout    ADLs       Mobility  Overal bed mobility: Needs Assistance Bed Mobility: Rolling, Sidelying to Sit, Sit to Sidelying Rolling: Min assist Sidelying to sit: Mod assist Sit to sidelying: Mod assist General bed mobility comments: assist to elevate trunk to upright and for positioning of LEs in bed    Transfers  Overall transfer level: Needs assistance Equipment used: Rolling walker (2 wheeled) Transfers: Sit to/from Stand, Stand Pivot Transfers Sit to Stand: Min assist Stand pivot transfers: Min assist General transfer comment: Vcs for hand placement, min assist to standing, reports discomfort and pain in LLE (foot)    Ambulation / Gait / Stairs / Wheelchair Mobility  Ambulation/Gait Ambulation/Gait assistance: Mod assist Ambulation Distance (Feet): 4 Feet Assistive device: Rolling walker (2 wheeled) Gait Pattern/deviations: Step-to pattern General Gait Details: awaiting left AFO;  for foot drop/lateral ankle stability. Attempted side steps to [facility] tolerate but limited significantly with LLE    Posture / Balance Dynamic Sitting Balance Sitting balance - Comments: pt able to sit EOB with supervision for safety Balance Overall balance assessment: Needs assistance Sitting-balance support: Feet supported, No upper extremity supported Sitting balance-Leahy Scale: Good Sitting  balance - Comments: pt able to sit EOB with supervision for safety Standing balance support: During functional activity, Bilateral upper extremity supported Standing balance-Leahy Scale: Poor Standing balance comment: depends on device due to LE weakness  Special needs/care consideration BiPAP/CPAP No CPM No Continuous Drip IV No Dialysis No        Life Vest No Oxygen No Special Bed no Trach Size no Wound Vac (area) No     Skin Post op lumbar back incision. Lumbar corset in place.                             Bowel mgmt: Last BM 11/18/16 Bladder mgmt: Urinary catheter Diabetic mgmt No    Previous Home Environment Living Arrangements: Spouse/significant other Available Help at Discharge: Family Type of Home: House Home Layout: One level Home Access: Stairs to enter Technical brewer of Steps: 2 Bathroom Shower/Tub: Public librarian, Gaffer [place]: No  Discharge Living Setting Plans for Discharge Living Setting: Patient's home, House, Lives with (comment) (Lives with husband.) Type of Home at Discharge: House Discharge Home Layout: One level Discharge Home Access: Stairs to enter Entrance Stairs-Number of Steps: 3 steps at the back and 2 steps at the front entry. Does the patient have any problems obtaining your medications?: No  Social/Family/Support Systems Patient Roles: Spouse, Parent, Other (Comment) (Has a husband, 2 daughters, 2 sisters.) Contact Information: Antara Brecheisen - husband - [phone] Anticipated Caregiver: Husband, daughters, sisters Anticipated Caregiver's Contact Information: [name] - daughter close by - [phone] Ability/Limitations of Caregiver: Husband works as an Conservation officer, nature, but can take time off to assist.  One Dtr close by and another 5 miles away. Caregiver Availability: 24/7 Discharge Plan Discussed with Primary Caregiver: Yes Is Caregiver In Agreement with Plan?: Yes Does Caregiver/Family have Issues  with Lodging/Transportation while Pt is in Rehab?: No  Goals/Additional Needs Patient/Family Goal for Rehab: PT/OT mod I goals Expected length of stay: 10-14 days Cultural Considerations: None Dietary Needs: Regular diet, thin liquids Equipment Needs: TBD Pt/Family Agrees to Admission and willing to participate: Yes Program Orientation Provided & Reviewed with Pt/Caregiver Including Roles  & Responsibilities: Yes  Decrease burden of Care through IP rehab admission: N/A  Possible need for SNF placement upon discharge: Not anticipated  Patient Condition: This patient's medical and functional status has changed since the consult dated: 11/16/16 in which the Rehabilitation Physician determined and documented that the patient's condition is appropriate for intensive rehabilitative care in an inpatient rehabilitation facility. See "History of Present Illness" (above) for medical update. Functional changes are:  Currently requiring min assist for transfers and mod assist to ambulate 4 feet RW. Patient's medical and functional status update has been discussed with the Rehabilitation physician and patient remains appropriate for inpatient rehabilitation. Will admit to inpatient rehab today.  Preadmission Screen Completed By:  Retta Diones, 11/18/2016 12:58 PM ______________________________________________________________________   Discussed status with Dr. Naaman Plummer on 11/18/16 at 1258 and received telephone approval for admission today.  Admission Coordinator:  Retta Diones, time 1258/Date 11/18/16       Cosigned by: Meredith Staggers, MD at 11/18/2016 1:29 PM  Revision History

## 2016-11-18 NOTE — Progress Notes (Signed)
Physical Therapy Treatment Patient Details Name: [name] MRN: [id number] DOB: 02-11-1941 Today's Date: 11/18/2016    History of Present Illness Pt is a 76 y/o female s/p PLIF L4-S1 on 7/18 with revision of lumbar instrumentation on 7/19. PMH including but not limited to HTN and Bell's palsy.    PT Comments    Met with patient and vendor for fitting of AFO. Patient fitted with medial shaft AFO for drop foot. Brace appears to show good alignment for patient and not evidence of skin contact from shaft. Patient abe to take series of step/reverse steps with brace with improvements noted in LLE performance. Only issue remaining is proper footwear for patient given current edema bilaterally. Patient's daughter present at bedside and states that she will be purchasing new shoes for patient this afternoon. At this time, feel patient will be appropriate for d/c to rehab for progression of rehabilitation. Will continue to see to facilitate discharge plan.   Follow Up Recommendations  CIR     Equipment Recommendations  None recommended by PT    Recommendations for Other Services Rehab consult     Precautions / Restrictions Precautions Precautions: Back;Fall Precaution Booklet Issued: No Precaution Comments: Pt able to recall 3/3 back precautions without cues.  Required Braces or Orthoses: Spinal Brace;Other Brace/Splint Spinal Brace: Lumbar corset;Applied in sitting position Other Brace/Splint: left AFO Restrictions Weight Bearing Restrictions: No    Mobility  Bed Mobility               General bed mobility comments: received in chair  Transfers Overall transfer level: Needs assistance Equipment used: Rolling walker (2 wheeled) Transfers: Sit to/from Stand Sit to Stand: Min assist;Min guard         General transfer comment: VCs for hand placement  Ambulation/Gait Ambulation/Gait assistance: Min assist Ambulation Distance (Feet): 3 Feet Assistive device: Rolling  walker (2 wheeled) Gait Pattern/deviations: Step-to pattern     General Gait Details: performed series of fwd and reverse steps with RW and assist to establish fit of AFO   Stairs            Wheelchair Mobility    Modified Rankin (Stroke Patients Only)       Balance Overall balance assessment: Needs assistance Sitting-balance support: Feet supported;No upper extremity supported Sitting balance-Leahy Scale: Good     Standing balance support: During functional activity;Bilateral upper extremity supported Standing balance-Leahy Scale: Poor                              Cognition Arousal/Alertness: Awake/alert Behavior During Therapy: WFL for tasks assessed/performed Overall Cognitive Status: Within Functional Limits for tasks assessed                                 General Comments: no issues with cogntion evident this session      Exercises      General Comments General comments (skin integrity, edema, etc.): educated on elevation of LEs for edema control      Pertinent Vitals/Pain Pain Assessment: 0-10 Pain Score: 3  Pain Location: back, L LE (foot) Pain Descriptors / Indicators: Aching;Discomfort;Sore;Shooting Pain Intervention(s): Monitored during session    Home Living                      Prior Function            PT  Goals (current goals can now be found in the care plan section) Acute Rehab PT Goals Patient Stated Goal: return home PT Goal Formulation: With patient/family Time For Goal Achievement: 11/28/16 Potential to Achieve Goals: Good Progress towards PT goals: Progressing toward goals    Frequency    Min 5X/week      PT Plan Current plan remains appropriate    Co-evaluation              AM-PAC PT "6 Clicks" Daily Activity  Outcome Measure  Difficulty turning over in bed (including adjusting bedclothes, sheets and blankets)?: A Little Difficulty moving from lying on back to sitting on  the side of the bed? : A Little Difficulty sitting down on and standing up from a chair with arms (e.g., wheelchair, bedside commode, etc,.)?: A Little Help needed moving to and from a bed to chair (including a wheelchair)?: A Little Help needed walking in hospital room?: A Lot Help needed climbing 3-5 steps with a railing? : Total 6 Click Score: 15    End of Session Equipment Utilized During Treatment: Gait belt;Back brace Activity Tolerance: Patient tolerated treatment well Patient left: in chair;with call bell/phone within reach;with chair alarm set;with family/visitor present Nurse Communication: Mobility status PT Visit Diagnosis: Other symptoms and signs involving the nervous system (R29.898);Other abnormalities of gait and mobility (R26.89) Pain - Right/Left: Left Pain - part of body: Leg     Time: 2230-0979 PT Time Calculation (min) (ACUTE ONLY): 27 min  Charges:  $Therapeutic Activity: 23-37 mins                    G Codes:       Alben Deeds, PT DPT  Board Certified Neurologic Specialist [phone]    Duncan Dull 11/18/2016, 3:17 PM

## 2016-11-18 NOTE — Progress Notes (Signed)
Kirsteins, Luanna Salk, MD Physician Signed Physical Medicine and Rehabilitation  Consult Note Date of Service: 11/16/2016 11:13 AM  Related encounter: Admission (Current) from 11/12/2016 in [place] All Collapse All   [] Hide copied text [] Hover for attribution information  Physical Medicine and Rehabilitation Consult Reason for Consult:Left leg numbness Referring Phsyician: [name] is an 76 y.o. female.   HPI: Patient's main complaints of back and leg pain, she initially underwent medical management but had persistent pain. Was evaluated by neurosurgery as an outpatient and had a lumbar CT myelogram, on 09/04/2016 demonstrating lumbar retrolisthesis, 2 mm at L2-3, L3-4, 9 mm of anterolisthesis L4 on 5. Bilateral lateral recess narrowing. L1 to lateral recess stenosis L4-5 and L5-S1. Compared to 2011, progressive right-sided L1-L2 foraminal encroachment, possible L4-5 foraminal stenosis causing potential neural compression. Admitted for decompression and fusion on 11/12/2016. Underwent L4-S1 PLIF on 11/12/2016, required revision, L5, pedicle screw on 11/13/2016. Physical therapy evaluation on 11/14/2016, left lower extremity weakness, difficulty advancing left foot forward, mod assist bed mobility and mod assist times 2 for transfers. CIR recommended Postop urinary retention with  1350 mL cath Review of Systems - Negative except Left foot numbness, left foot and ankle weakness, urine retention Denies chest pain, shortness breath, nausea, vomiting, diarrhea, constipation, skin rash, visual changes, sweats and chills, generalized weakness, swallowing problems     Past Medical History:  Diagnosis Date  . Anemia   . Arthritis   . Bell's palsy   . GERD (gastroesophageal reflux disease)   . Hemochromatosis   . History of hiatal hernia   . Hypertension   . PE (pulmonary embolism)   . Pneumonia    "walking" pneumonia  .  Prothrombin gene mutation ([place]) 05/30/2013  . Restless legs   . Right leg DVT ([place]) 05/30/2013  . Stroke [facility])    found on a MRI, she's not aware otherwise        Past Surgical History:  Procedure Laterality Date  . BACK SURGERY    . COLONOSCOPY    . SHOULDER SURGERY Bilateral         Family History  Problem Relation Age of Onset  . Congestive Heart Failure Mother   . Pancreatic cancer Father    Social History:  reports that she has never smoked. She has never used smokeless tobacco. She reports that she does not drink alcohol or use drugs. Allergies:       Allergies  Allergen Reactions  . Penicillins Anaphylaxis    Pt was 60 or 76 years old  Has patient had a PCN reaction causing immediate rash, facial/tongue/throat swelling, SOB or lightheadedness with hypotension: Yes Has patient had a PCN reaction causing severe rash involving mucus membranes or skin necrosis: Unknown Has patient had a PCN reaction that required hospitalization: No Has patient had a PCN reaction occurring within the last 10 years: No If all of the above answers are "NO", then may proceed with Cephalosporin use.   . Sulfa Antibiotics Other (See Comments)    unknown  . Morphine And Related Other (See Comments)    Twitching in large doses          Medications Prior to Admission  Medication Sig Dispense Refill  . amLODipine (NORVASC) 5 MG tablet Take 5 mg by mouth daily.     Marland Kitchen aspirin EC 81 MG tablet Take 162 mg by mouth daily.    . Biotin 5000 MCG TABS  Take 5,000 mcg by mouth daily.     Renard Hamper Cohosh (REMIFEMIN PO) Take 1 tablet by mouth 2 (two) times daily.    . Calcium Carbonate-Vitamin D (CALCIUM 600+D PO) Take 1 tablet by mouth 2 (two) times daily.    . Cholecalciferol (VITAMIN D) 2000 UNITS tablet Take 2,000 Units by mouth 2 (two) times daily.    . citalopram (CELEXA) 20 MG tablet Take 20 mg by mouth at bedtime.     . fish oil-omega-3 fatty acids 1000 MG capsule Take  1 g by mouth daily.     . hydrochlorothiazide (HYDRODIURIL) 25 MG tablet Take 25 mg by mouth daily.    . Hypromellose (ARTIFICIAL TEARS OP) Place 1 drop into both eyes 3 (three) times daily as needed (dry eyes).    . Multiple Vitamin (MULTIVITAMIN WITH MINERALS) TABS tablet Take 1 tablet by mouth daily.    Marland Kitchen oxyCODONE-acetaminophen (PERCOCET/ROXICET) 5-325 MG tablet Take 1 tablet by mouth 3 (three) times daily as needed for moderate pain.     . pantoprazole (PROTONIX) 40 MG tablet Take 40 mg by mouth daily before supper.    . pramipexole (MIRAPEX) 0.5 MG tablet Take 0.5 mg by mouth every evening.    . propranolol (INDERAL) 10 MG tablet Take 10 mg by mouth 2 (two) times daily.     . RESTASIS 0.05 % ophthalmic emulsion Place 1 drop into both eyes 2 (two) times daily.    . temazepam (RESTORIL) 15 MG capsule Take 2 capsules (30 mg total) by mouth at bedtime. (Patient taking differently: Take 15 mg by mouth at bedtime. ) 30 capsule 3  . Turmeric Curcumin 500 MG CAPS Take 500 mg by mouth at bedtime.     . vitamin E 400 UNIT capsule Take 400 Units by mouth daily.    . colchicine 0.6 MG tablet Take 1 tablet by mouth daily as needed. Gout flare up.    . cyclobenzaprine (FLEXERIL) 10 MG tablet Take 1 tablet (10 mg total) by mouth 2 (two) times daily as needed for muscle spasms. 12 tablet 0  . HYDROcodone-acetaminophen (NORCO/VICODIN) 5-325 MG tablet Take 1 tablet by mouth every 6 (six) hours as needed. (Patient not taking: Reported on 08/27/2016) 10 tablet 0  . methocarbamol (ROBAXIN) 500 MG tablet Take 1 tablet (500 mg total) by mouth 2 (two) times daily. (Patient not taking: Reported on 10/30/2016) 20 tablet 0    Home: [place] expects to be discharged to:: Private residence Living Arrangements: Spouse/significant other Available Help at Discharge: Family Type of Home: House Home Access: Stairs to enter Technical brewer of Steps: 2 Home Layout: One  level Bathroom Shower/Tub: Public librarian, Walk-in shower Home Equipment: Environmental consultant - 2 wheels, [employer] - single point, Careers adviser History: Functional Status:  Mobility:  ADL:  Cognition: Cognition Overall Cognitive Status: Within Functional Limits for tasks assessed Orientation Level: Oriented X4 Cognition Arousal/Alertness: Awake/alert Behavior During Therapy: WFL for tasks assessed/performed Overall Cognitive Status: Within Functional Limits for tasks assessed Area of Impairment: Memory, Following commands, Safety/judgement, Problem solving Memory: Decreased recall of precautions, Decreased short-term memory Following Commands: Follows one step commands consistently Safety/Judgement: Decreased awareness of safety, Decreased awareness of deficits Problem Solving: Difficulty sequencing, Requires verbal cues  Blood pressure (!) 130/57, pulse 81, temperature 98.3 F (36.8 C), temperature source Oral, resp. rate 18, height 5' 2.5" (1.588 m), weight 90.7 kg (200 lb), SpO2 98 %. Physical Exam   General: No acute distress Mood and affect are  appropriate Heart: Regular rate and rhythm no rubs murmurs or extra sounds Lungs: Clear to auscultation, breathing unlabored, no rales or wheezes Abdomen: Positive bowel sounds, soft nontender to palpation, nondistended Extremities: No clubbing, cyanosis, or edema Skin: No evidence of breakdown, no evidence of rash Neurologic: Cranial nerves II through XII intact, motor strength is 5/5 in bilateral deltoid, bicep, tricep, grip, R hip flexor, knee extensors, ankle dorsiflexor and plantar flexor 4/5 left hip flexor, knee extensor, 0 ankle dorsiflexes and plantar flexion, toe flexion, extension Sensory exam normal sensation to light touch and proprioception in bilateral upper and right lower extremities Absent light touch sensation. Left L5 and left S1, reduced proprioception left great toe Cerebellar exam normal finger to nose to finger  as well as heel to shin in bilateral upper and lower extremities Musculoskeletal: Full range of motion in all 4 extremities. No joint swelling Lab Results Last 24 Hours  No results found for this or any previous visit (from the past 24 hour(s)).   Imaging Results (Last 48 hours)  No results found.    Assessment/Plan: Diagnosis: Lumbar spondylolisthesis status post fusion with left L5 radiculopathy causing foot drop, postoperative urinary retention 1. Does the need for close, 24 hr/day medical supervision in concert with the patient's rehab needs make it unreasonable for this patient to be served in a less intensive setting? Yes 2. Co-Morbidities requiring supervision/potential complications: Hypertension, urinary retention requiring catheterization, wound care 3. Due to bladder management, bowel management, safety, skin/wound care, disease management, medication administration, pain management and patient education, does the patient require 24 hr/day rehab nursing? Yes 4. Does the patient require coordinated care of a physician, rehab nurse, PT (1-2 hrs/day, 5 days/week) and OT (1-2 hrs/day, 5 days/week) to address physical and functional deficits in the context of the above medical diagnosis(es)? Yes Addressing deficits in the following areas: balance, endurance, locomotion, strength, transferring, bowel/bladder control, bathing, dressing, feeding, grooming, toileting and psychosocial support 5. Can the patient actively participate in an intensive therapy program of at least 3 hrs of therapy per day at least 5 days per week? Yes 6. The potential for patient to make measurable gains while on inpatient rehab is excellent 7. Anticipated functional outcomes upon discharge from inpatients are Mod I PT, Mod I OT, NASLP 8. Estimated rehab length of stay to reach the above functional goals is: 10-14d 9. Does the patient have adequate social supports to accommodate these discharge functional goals?  Yes 10. Anticipated D/C setting: Home 11. Anticipated post D/C treatments: [place] therapy 12. Overall Rehab/Functional Prognosis: excellent  RECOMMENDATIONS: This patient's condition is appropriate for continued rehabilitative care in the following setting: CIR Patient has agreed to participate in recommended program. Yes Note that insurance prior authorization may be required for reimbursement for recommended care.  Comment:   Charlett Blake 11/16/2016     Routing History

## 2016-11-18 NOTE — Consult Note (Signed)
[facility] CM Primary Care Navigator  11/18/2016  WM FRUCHTER Apr 05, 1941 [id number]  Met with patient and daughter at the bedside to identify possible discharge needs. Patient reports having worsening pain to back radiating down to right lower extremity that led to this admission/ surgery.  Patient endorses Dr. Shirline Frees with [facility] as the primary care provider.    Patient shared using [facility] in [place] as well as in [place] to obtain medications without any problem.   Patient manages her own medications at home using "pill box" system filled weekly.   Patient was driving prior to admission/ surgery, however, husband Alease Medina), daughter or other family members can provide transportation to her doctors' appointments after discharge.  Husband will be her primary caregiver at home. Her daughter and her sisters will be able to provide assistance with care if needed. Per patient, she has "good family support" at home.   Anticipated discharge plan is to rehab facility (in process). Per daughter, decision will still  be made whether CIR [facility]) or skilled nursing facility.  Patient and daughter voiced understanding to call primary care provider's office for a post discharge follow-up appointment within a week or sooner if needs arise. Patient letter (with PCP's contact number) was provided as a reminder.  Both patient and daughter denies any health management needs or concerns at this time. [facility] care management information provided for future needs that she may have.   For additional questions please contact:  Edwena Felty A. Maxemiliano Riel, BSN, RN-BC [facility] PRIMARY CARE Navigator Cell: [phone]

## 2016-11-18 NOTE — PMR Pre-admission (Signed)
PMR Admission Coordinator Pre-Admission Assessment  Patient: [name] is an 76 y.o., female MRN: [id number] DOB: 08-03-1940 Height: 5' 2.5" (158.8 cm) Weight: 90.7 kg (200 lb)              Insurance Information HMO:     PPO:       PCP:       IPA:       80/20:       OTHER:  Group # [id number] PRIMARY: [employer]      Policy#: [id number]      Subscriber:  [name] CM Name: Janey Genta      Phone#: [phone]     Fax#:  Has EPIC access Pre-Cert#: [id number]      Employer:  Retired Benefits:  Phone #: [phone]     Name:  Randol Kern. Date: 04/28/14     Deduct:  $0      Out of Pocket Max: $3400 (met $550.99)      Life Max: N/A CIR: $250 days 1-6      SNF:  $40 days 1-20; $160 days 21-100 Outpatient:  Medical necessity     Co-Pay: $40/visit Home Health: With authorization      Co-Pay: $25/visit DME: 80%     Co-Pay: 20% Providers: in network  Emergency Emily    Name Relation Home [place] Spouse [phone]  [phone]   Davis,Sandy Daughter  [phone]      Current Medical History  Patient Admitting Diagnosis:   Lumbar spondylolisthesis status post fusion with left L5 radiculopathy causing foot drop, postoperative urinary retention  History of Present Illness:  A 76 y.o. female with history of HTN, PE/DVT, multiple back surgeries who developed recurrent pain radiating to RLE due to L4/5 and L5-S1 spondylolisthesis with recurrent stenosis L4, L5 and S1 nerve roots causing neurogenic claudication. She elected to undergo redo L4/5 and L5-S1 decompressive laminectomy with nerve root decompression on 11/12/16 by Dr. Arnoldo Morale. Post op with LLE numbness with EHL weakness due to displacement of L5 pedicle screw. She was taken back to OR on 7/19 for revision of lumbar instrumentation and AFO ordered for Left foot drop. Therapy ongoing and patient with significant deficits in mobility and CIR recommended by rehab team.    Past  Medical History  Past Medical History:  Diagnosis Date  . Anemia   . Arthritis   . Bell's palsy   . GERD (gastroesophageal reflux disease)   . Hemochromatosis   . History of hiatal hernia   . Hypertension   . PE (pulmonary embolism)   . Pneumonia    "walking" pneumonia  . Prothrombin gene mutation ([place]) 05/30/2013  . Restless legs   . Right leg DVT ([place]) 05/30/2013  . Stroke [facility])    found on a MRI, she's not aware otherwise    Family History  family history includes Congestive Heart Failure in her mother; Pancreatic cancer in her father.  Prior Rehab/Hospitalizations: No previous rehab.  Has the patient had major surgery during 100 days prior to admission? No  Current Medications   Current Facility-Administered Medications:  .  0.9 %  sodium chloride infusion, 250 mL, Intravenous, Continuous, Newman Pies, MD .  acetaminophen (TYLENOL) tablet 650 mg, 650 mg, Oral, Q4H PRN, 650 mg at 11/18/16 0910 **OR** acetaminophen (TYLENOL) suppository 650 mg, 650 mg, Rectal, Q4H PRN, Newman Pies, MD .  amLODipine (NORVASC) tablet 5 mg, 5 mg, Oral, Daily, Newman Pies, MD, 5 mg at 11/18/16  6256 .  bisacodyl (DULCOLAX) suppository 10 mg, 10 mg, Rectal, Daily PRN, Newman Pies, MD .  citalopram (CELEXA) tablet 20 mg, 20 mg, Oral, QHS, Newman Pies, MD, 20 mg at 11/17/16 2128 .  colchicine tablet 0.6 mg, 0.6 mg, Oral, Daily, Newman Pies, MD, 0.6 mg at 11/18/16 0910 .  cyclobenzaprine (FLEXERIL) tablet 10 mg, 10 mg, Oral, TID PRN, Newman Pies, MD, 10 mg at 11/17/16 0916 .  cycloSPORINE (RESTASIS) 0.05 % ophthalmic emulsion 1 drop, 1 drop, Both Eyes, BID, Newman Pies, MD, 1 drop at 11/18/16 0910 .  docusate sodium (COLACE) capsule 100 mg, 100 mg, Oral, BID, Newman Pies, MD, 100 mg at 11/18/16 0910 .  gabapentin (NEURONTIN) tablet 300 mg, 300 mg, Oral, TID, Newman Pies, MD, 300 mg at 11/18/16 0910 .  hydrochlorothiazide (HYDRODIURIL) tablet 25 mg, 25 mg,  Oral, Daily, Newman Pies, MD, 25 mg at 11/18/16 0910 .  lactated ringers infusion, , Intravenous, Continuous, Roberts Gaudy, MD, Last Rate: 50 mL/hr at 11/12/16 1231 .  menthol-cetylpyridinium (CEPACOL) lozenge 3 mg, 1 lozenge, Oral, PRN **OR** phenol (CHLORASEPTIC) mouth spray 1 spray, 1 spray, Mouth/Throat, PRN, Newman Pies, MD .  morphine 4 MG/ML injection 4 mg, 4 mg, Intravenous, Q2H PRN, Newman Pies, MD, 4 mg at 11/15/16 0313 .  multivitamin with minerals tablet 1 tablet, 1 tablet, Oral, Daily, Newman Pies, MD, 1 tablet at 11/18/16 0910 .  ondansetron (ZOFRAN) tablet 4 mg, 4 mg, Oral, Q6H PRN **OR** ondansetron (ZOFRAN) injection 4 mg, 4 mg, Intravenous, Q6H PRN, Newman Pies, MD .  oxyCODONE (Oxy IR/ROXICODONE) immediate release tablet 5-10 mg, 5-10 mg, Oral, Q3H PRN, Newman Pies, MD, 10 mg at 11/17/16 2001 .  pantoprazole (PROTONIX) EC tablet 40 mg, 40 mg, Oral, QAC supper, Newman Pies, MD, 40 mg at 11/17/16 1755 .  polyvinyl alcohol (LIQUIFILM TEARS) 1.4 % ophthalmic solution 1 drop, 1 drop, Both Eyes, PRN, Newman Pies, MD .  pramipexole (MIRAPEX) tablet 0.5 mg, 0.5 mg, Oral, QPM, Newman Pies, MD, 0.5 mg at 11/17/16 1755 .  propranolol (INDERAL) tablet 10 mg, 10 mg, Oral, BID, Newman Pies, MD, 10 mg at 11/18/16 0910 .  senna (SENOKOT) tablet 17.2 mg, 2 tablet, Oral, Daily, Newman Pies, MD, 17.2 mg at 11/18/16 0910 .  sodium chloride flush (NS) 0.9 % injection 3 mL, 3 mL, Intravenous, Q12H, Newman Pies, MD, 3 mL at 11/17/16 0917 .  sodium chloride flush (NS) 0.9 % injection 3 mL, 3 mL, Intravenous, PRN, Newman Pies, MD .  temazepam (RESTORIL) capsule 15 mg, 15 mg, Oral, QHS PRN, Newman Pies, MD  Patients Current Diet: Diet regular Room service appropriate? Yes; Fluid consistency: Thin Diet - low sodium heart healthy  Precautions / Restrictions Precautions Precautions: Back, Fall Precaution Booklet Issued: No Precaution  Comments: Pt able to recall 3/3 back precautions without cues.  Spinal Brace: Lumbar corset, Applied in sitting position Other Brace/Splint: left AFO, ill-fitting, awaiting new brace from [employer]; foot drop and ankle rolls into eversion Restrictions Weight Bearing Restrictions: No   Has the patient had 2 or more falls or a fall with injury in the past year?No  Prior Activity Level Limited Community (1-2x/wk): Went out 3 X a week, was driving.  Home Assistive Devices / Equipment Home Assistive Devices/Equipment: Eyeglasses, Radio producer (specify quad or straight), Walker (specify type) Home Equipment: Walker - 2 wheels, Cane - single point, Shower seat  Prior Device Use: Indicate devices/aids used by the patient prior to current illness, exacerbation or injury? Walker and [employer]  Prior Functional Level Prior Function Level of Independence: Independent  Self Care: Did the patient need help bathing, dressing, using the toilet or eating?  Independent  Indoor Mobility: Did the patient need assistance with walking from room to room (with or without device)? Independent  Stairs: Did the patient need assistance with internal or external stairs (with or without device)? Independent  Functional Cognition: Did the patient need help planning regular tasks such as shopping or remembering to take medications? Independent  Current Functional Level Cognition  Overall Cognitive Status: Within Functional Limits for tasks assessed Orientation Level: Oriented X4 Following Commands: Follows one step commands consistently Safety/Judgement: Decreased awareness of safety, Decreased awareness of deficits General Comments: no issues with cogntion evident this session    Extremity Assessment (includes Sensation/Coordination)  Upper Extremity Assessment: Overall WFL for tasks assessed  Lower Extremity Assessment: LLE deficits/detail LLE Deficits / Details: pt with decreased strength throughout; MMT revealed 2/5  for hip flexion, knee extension, knee flexion and ankle DF. Sensation is diminished to light touch throughout    ADLs       Mobility  Overal bed mobility: Needs Assistance Bed Mobility: Rolling, Sidelying to Sit, Sit to Sidelying Rolling: Min assist Sidelying to sit: Mod assist Sit to sidelying: Mod assist General bed mobility comments: assist to elevate trunk to upright and for positioning of LEs in bed    Transfers  Overall transfer level: Needs assistance Equipment used: Rolling walker (2 wheeled) Transfers: Sit to/from Stand, Stand Pivot Transfers Sit to Stand: Min assist Stand pivot transfers: Min assist General transfer comment: Vcs for hand placement, min assist to standing, reports discomfort and pain in LLE (foot)    Ambulation / Gait / Stairs / Wheelchair Mobility  Ambulation/Gait Ambulation/Gait assistance: Mod assist Ambulation Distance (Feet): 4 Feet Assistive device: Rolling walker (2 wheeled) Gait Pattern/deviations: Step-to pattern General Gait Details: awaiting left AFO;  for foot drop/lateral ankle stability. Attempted side steps to [facility] tolerate but limited significantly with LLE    Posture / Balance Dynamic Sitting Balance Sitting balance - Comments: pt able to sit EOB with supervision for safety Balance Overall balance assessment: Needs assistance Sitting-balance support: Feet supported, No upper extremity supported Sitting balance-Leahy Scale: Good Sitting balance - Comments: pt able to sit EOB with supervision for safety Standing balance support: During functional activity, Bilateral upper extremity supported Standing balance-Leahy Scale: Poor Standing balance comment: depends on device due to LE weakness    Special needs/care consideration BiPAP/CPAP No CPM No Continuous Drip IV No Dialysis No        Life Vest No Oxygen No Special Bed no Trach Size no Wound Vac (area) No     Skin Post op lumbar back incision. Lumbar corset in place.                              Bowel mgmt: Last BM 11/18/16 Bladder mgmt: Urinary catheter Diabetic mgmt No    Previous Home Environment Living Arrangements: Spouse/significant other Available Help at Discharge: Family Type of Home: House Home Layout: One level Home Access: Stairs to enter Technical brewer of Steps: 2 Bathroom Shower/Tub: Public librarian, Gaffer [place]: No  Discharge Living Setting Plans for Discharge Living Setting: Patient's home, House, Lives with (comment) (Lives with husband.) Type of Home at Discharge: House Discharge Home Layout: One level Discharge Home Access: Stairs to enter Entrance Stairs-Number of Steps: 3 steps at the back and 2 steps  at the front entry. Does the patient have any problems obtaining your medications?: No  Social/Family/Support Systems Patient Roles: Spouse, Parent, Other (Comment) (Has a husband, 2 daughters, 2 sisters.) Contact Information: Anaiz Qazi - husband - [phone] Anticipated Caregiver: Husband, daughters, sisters Anticipated Caregiver's Contact Information: [name] - daughter close by - [phone] Ability/Limitations of Caregiver: Husband works as an Conservation officer, nature, but can take time off to assist.  One Dtr close by and another 5 miles away. Caregiver Availability: 24/7 Discharge Plan Discussed with Primary Caregiver: Yes Is Caregiver In Agreement with Plan?: Yes Does Caregiver/Family have Issues with Lodging/Transportation while Pt is in Rehab?: No  Goals/Additional Needs Patient/Family Goal for Rehab: PT/OT mod I goals Expected length of stay: 10-14 days Cultural Considerations: None Dietary Needs: Regular diet, thin liquids Equipment Needs: TBD Pt/Family Agrees to Admission and willing to participate: Yes Program Orientation Provided & Reviewed with Pt/Caregiver Including Roles  & Responsibilities: Yes  Decrease burden of Care through IP rehab admission: N/A  Possible need for SNF placement  upon discharge: Not anticipated  Patient Condition: This patient's medical and functional status has changed since the consult dated: 11/16/16 in which the Rehabilitation Physician determined and documented that the patient's condition is appropriate for intensive rehabilitative care in an inpatient rehabilitation facility. See "History of Present Illness" (above) for medical update. Functional changes are:  Currently requiring min assist for transfers and mod assist to ambulate 4 feet RW. Patient's medical and functional status update has been discussed with the Rehabilitation physician and patient remains appropriate for inpatient rehabilitation. Will admit to inpatient rehab today.  Preadmission Screen Completed By:  Retta Diones, 11/18/2016 12:58 PM ______________________________________________________________________   Discussed status with Dr. Naaman Plummer on 11/18/16 at 1258 and received telephone approval for admission today.  Admission Coordinator:  Retta Diones, time 1258/Date 11/18/16

## 2016-11-18 NOTE — H&P (Signed)
Physical Medicine and Rehabilitation Admission H&P    CC: Lumbar spondylolisthesis s/p decompression with hardware failure/foot drop and urinary retention.    HPI:  [name] is a 76 y.o. female with history of HTN, PE/DVT, multiple back surgeries who developed recurrent pain radiating to RLE due to L4/5 and L5-S1 spondylolisthesis with recurrent stenosis L4, L5 and S1 nerve roots causing neurogenic claudication. She elected to undergo redo L4/5 and L5-S1 decompressive laminectomy with nerve root decompression on 11/12/16 by Dr. Arnoldo Morale. Post op with LLE numbness with EHL weakness due to displacement of L5 pedicle screw. She was taken back to OR on 7/19 for revision of lumbar instrumentation and AFO ordered for Left foot drop.  Neuropathy improved with addition of neurontin. She has had issues with urinary retention with volumes from 600 cc- 1000 cc and foley replaced yesterday. Therapy ongoing and patient with significant deficits in mobility and CIR recommended by rehab team.    Review of Systems  HENT: Positive for hearing loss. Negative for tinnitus.   Eyes: Negative for blurred vision and double vision.  Respiratory: Negative for cough and shortness of breath.   Cardiovascular: Negative for chest pain and palpitations.  Gastrointestinal: Positive for constipation, heartburn and nausea.  Genitourinary: Negative for dysuria and urgency.  Musculoskeletal: Negative for myalgias.  Skin: Negative for rash.  Neurological: Positive for dizziness (on and off since last year), sensory change (LLE heaviness), focal weakness and weakness. Negative for headaches.  Psychiatric/Behavioral: Positive for depression. The patient has insomnia.           Past Medical History:  Diagnosis Date  . Anemia   . Arthritis   . Bell's palsy   . GERD (gastroesophageal reflux disease)   . Hemochromatosis   . History of hiatal hernia   . Hypertension   . PE (pulmonary embolism)   .  Pneumonia    "walking" pneumonia  . Prothrombin gene mutation ([place]) 05/30/2013  . Restless legs   . Right leg DVT ([place]) 05/30/2013  . Stroke [facility])    found on a MRI, she's not aware otherwise         Past Surgical History:  Procedure Laterality Date  . BACK SURGERY    . COLONOSCOPY    . SHOULDER SURGERY Bilateral          Family History  Problem Relation Age of Onset  . Congestive Heart Failure Mother   . Pancreatic cancer Father     Social History:         Allergies  Allergen Reactions  . Penicillins Anaphylaxis    Pt was 33 or 76 years old  Has patient had a PCN reaction causing immediate rash, facial/tongue/throat swelling, SOB or lightheadedness with hypotension: Yes Has patient had a PCN reaction causing severe rash involving mucus membranes or skin necrosis: Unknown Has patient had a PCN reaction that required hospitalization: No Has patient had a PCN reaction occurring within the last 10 years: No If all of the above answers are "NO", then may proceed with Cephalosporin use.   . Sulfa Antibiotics Other (See Comments)    unknown  . Morphine And Related Other (See Comments)    Twitching in large doses           Medications Prior to Admission  Medication Sig Dispense Refill  . amLODipine (NORVASC) 5 MG tablet Take 5 mg by mouth daily.     Marland Kitchen aspirin EC 81 MG tablet Take 162 mg by mouth daily.    Marland Kitchen  Biotin 5000 MCG TABS Take 5,000 mcg by mouth daily.     Renard Hamper Cohosh (REMIFEMIN PO) Take 1 tablet by mouth 2 (two) times daily.    . Calcium Carbonate-Vitamin D (CALCIUM 600+D PO) Take 1 tablet by mouth 2 (two) times daily.    . Cholecalciferol (VITAMIN D) 2000 UNITS tablet Take 2,000 Units by mouth 2 (two) times daily.    . citalopram (CELEXA) 20 MG tablet Take 20 mg by mouth at bedtime.     . fish oil-omega-3 fatty acids 1000 MG capsule Take 1 g by mouth daily.     . hydrochlorothiazide (HYDRODIURIL) 25 MG tablet Take 25 mg  by mouth daily.    . Hypromellose (ARTIFICIAL TEARS OP) Place 1 drop into both eyes 3 (three) times daily as needed (dry eyes).    . Multiple Vitamin (MULTIVITAMIN WITH MINERALS) TABS tablet Take 1 tablet by mouth daily.    Marland Kitchen oxyCODONE-acetaminophen (PERCOCET/ROXICET) 5-325 MG tablet Take 1 tablet by mouth 3 (three) times daily as needed for moderate pain.     . pantoprazole (PROTONIX) 40 MG tablet Take 40 mg by mouth daily before supper.    . pramipexole (MIRAPEX) 0.5 MG tablet Take 0.5 mg by mouth every evening.    . propranolol (INDERAL) 10 MG tablet Take 10 mg by mouth 2 (two) times daily.     . RESTASIS 0.05 % ophthalmic emulsion Place 1 drop into both eyes 2 (two) times daily.    . temazepam (RESTORIL) 15 MG capsule Take 2 capsules (30 mg total) by mouth at bedtime. (Patient taking differently: Take 15 mg by mouth at bedtime. ) 30 capsule 3  . Turmeric Curcumin 500 MG CAPS Take 500 mg by mouth at bedtime.     . vitamin E 400 UNIT capsule Take 400 Units by mouth daily.    . colchicine 0.6 MG tablet Take 1 tablet by mouth daily as needed. Gout flare up.    . cyclobenzaprine (FLEXERIL) 10 MG tablet Take 1 tablet (10 mg total) by mouth 2 (two) times daily as needed for muscle spasms. 12 tablet 0  . HYDROcodone-acetaminophen (NORCO/VICODIN) 5-325 MG tablet Take 1 tablet by mouth every 6 (six) hours as needed. (Patient not taking: Reported on 08/27/2016) 10 tablet 0  . methocarbamol (ROBAXIN) 500 MG tablet Take 1 tablet (500 mg total) by mouth 2 (two) times daily. (Patient not taking: Reported on 10/30/2016) 20 tablet 0    Home: [place] expects to be discharged to:: Private residence Living Arrangements: Spouse/significant other Available Help at Discharge: Family Type of Home: House Home Access: Stairs to enter Technical brewer of Steps: 2 Home Layout: One level Bathroom Shower/Tub: Tub/shower unit, Walk-in shower Home Equipment: Environmental consultant - 2  wheels, [employer] - single point, Industrial/product designer History: Prior Function Level of Independence: Independent  Functional Status:  Mobility: Bed Mobility Overal bed mobility: Needs Assistance Bed Mobility: Rolling, Sidelying to Sit, Sit to Sidelying Rolling: Min assist Sidelying to sit: Mod assist Sit to sidelying: Mod assist General bed mobility comments: assist to elevate trunk to upright and for positioning of LEs in bed Transfers Overall transfer level: Needs assistance Equipment used: Rolling walker (2 wheeled) Transfers: Sit to/from Stand, Stand Pivot Transfers Sit to Stand: Min assist Stand pivot transfers: Min assist General transfer comment: Vcs for hand placement, min assist to standing, reports discomfort and pain in LLE (foot) Ambulation/Gait Ambulation/Gait assistance: Mod assist Ambulation Distance (Feet): 4 Feet Assistive device: Rolling walker (2  wheeled) Gait Pattern/deviations: Step-to pattern General Gait Details: awaiting left AFO;  for foot drop/lateral ankle stability. Attempted side steps to [facility] tolerate but limited significantly with LLE  ADL:  Cognition: Cognition Overall Cognitive Status: Within Functional Limits for tasks assessed Orientation Level: Oriented X4 Cognition Arousal/Alertness: Awake/alert Behavior During Therapy: WFL for tasks assessed/performed Overall Cognitive Status: Within Functional Limits for tasks assessed Area of Impairment: Memory, Following commands, Safety/judgement, Problem solving Memory: Decreased recall of precautions, Decreased short-term memory Following Commands: Follows one step commands consistently Safety/Judgement: Decreased awareness of safety, Decreased awareness of deficits Problem Solving: Difficulty sequencing, Requires verbal cues General Comments: no issues with cogntion evident this session   Blood pressure 132/82, pulse 92, temperature 99.1 F (37.3 C), temperature source Oral, resp. rate  20, height 5' 2.5" (1.588 m), weight 90.7 kg (200 lb), SpO2 98 %. Physical Exam  Nursing note and vitals reviewed. Constitutional: She is oriented to person, place, and time. She appears well-developed and well-nourished.  HENT:  Head: Normocephalic and atraumatic.  Eyes: Pupils are equal, round, and reactive to light. Conjunctivae are normal.  Neck: Normal range of motion. Neck supple.  Cardiovascular: Normal rate and regular rhythm.   Respiratory: Effort normal and breath sounds normal. No stridor. No respiratory distress. She has no wheezes.  GI: Soft. Bowel sounds are normal. She exhibits no distension. There is no tenderness.  Musculoskeletal: She exhibits no edema or tenderness.  Neurological: She is alert and oriented to person, place, and time. No cranial nerve deficit.  Speech clear. LLE weakness with foot drop. UE motor 5/5. RLE: 4- to 4/5 prox to distal. LLE: HE/HAD 1-2/5, KE 3/5, ADF tr, APF 2/5. Decreased LT along left leg from thigh to foot, more pronounces along lateral leg.   Skin: Skin is warm and dry.  Dry dressing on back incision  Psychiatric: She has a normal mood and affect. Her behavior is normal. Judgment and thought content normal.    Lab Results Last 48 Hours  No results found for this or any previous visit (from the past 48 hour(s)).   Imaging Results (Last 48 hours)  No results found.       Medical Problem List and Plan: 1.  Left lower extremity weakness and functional deficits secondary to lumbar stenosis/L4-S1 radiculopathy s/p lumbar decompression and fusion (with re-do)             -admit to inpatient rehab             -left carbon AFO delivered by [employer]             -PRAFO to support LLE while in bed 2.  H/o PE/DVT Prophylaxis/Anticoagulation: Mechanical: Sequential compression devices, below knee Bilateral lower extremities Check dopplers in am.  3. Pain Management:  Continue oxycodone with flexeril prn.   4. Mood: LCSW to follow for  evaluation and support.  5. Neuropsych: This patient is capable of making decisions on  her own behalf. 6. Skin/Wound Care: Monitor wound for healing.  7. Fluids/Electrolytes/Nutrition: Monitor I/O. Check lytes in am.  8. HTN: Monitor BP bid. On Norvasc, Inderal and HCTZ 9.  Hemochromatosis:  Stable. Has not had to be phlebotomized in 6 years.  10. Restless leg syndrome: On Mirapex 11. Urinary retention: Remove foley in am and start bladder training. Check UA/UCS.  12. Constipation: Increase senna to bid.   Post Admission Physician Evaluation: 1. Functional deficits secondary  to lumbar stenosis, left lumbar-sacral radiculopsthy. 2. Patient is admitted to receive collaborative, interdisciplinary  care between the physiatrist, rehab nursing staff, and therapy team. 3. Patient's level of medical complexity and substantial therapy needs in context of that medical necessity cannot be provided at a lesser intensity of care such as a SNF. 4. Patient has experienced substantial functional loss from his/her baseline which was documented above under the "Functional History" and "Functional Status" headings.  Judging by the patient's diagnosis, physical exam, and functional history, the patient has potential for functional progress which will result in measurable gains while on inpatient rehab.  These gains will be of substantial and practical use upon discharge  in facilitating mobility and self-care at the household level. 5. Physiatrist will provide 24 hour management of medical needs as well as oversight of the therapy plan/treatment and provide guidance as appropriate regarding the interaction of the two. 6. The Preadmission Screening has been reviewed and patient status is unchanged unless otherwise stated above. 7. 24 hour rehab nursing will assist with bladder management, bowel management, safety, skin/wound care, disease management, medication administration, pain management and patient education   and help integrate therapy concepts, techniques,education, etc. 8. PT will assess and treat for/with: Lower extremity strength, range of motion, stamina, balance, functional mobility, safety, adaptive techniques and equipment, NMR, orthotic use, family education, pain mgt.   Goals are: mod I. 9. OT will assess and treat for/with: ADL's, functional mobility, safety, upper extremity strength, adaptive techniques and equipment, NMR, pain mgt, family ed.   Goals are: mod I. Therapy may proceed with showering this patient. 10. SLP will assess and treat for/with: n/a.  Goals are: n/a. 11. Case Management and Social Worker will assess and treat for psychological issues and discharge planning. 12. Team conference will be held weekly to assess progress toward goals and to determine barriers to discharge. 13. Patient will receive at least 3 hours of therapy per day at least 5 days per week. 14. ELOS: 11-14 days       15. Prognosis:  excellent     Meredith Staggers, MD, [place] [facility] 11/18/2016  Bary Leriche, Hershal Coria 11/18/2016

## 2016-11-18 NOTE — Progress Notes (Signed)
CSW alerted by Rehab Admission Coordinator that patient will be admitting to CIR today. CSW no longer needed to facilitate discharge planning.  CSW signing off.  Laveda Abbe, [place] Clinical Social Worker [phone]

## 2016-11-19 ENCOUNTER — Inpatient Hospital Stay (HOSPITAL_COMMUNITY): Payer: PPO

## 2016-11-19 ENCOUNTER — Inpatient Hospital Stay (HOSPITAL_COMMUNITY): Payer: PPO | Admitting: Occupational Therapy

## 2016-11-19 ENCOUNTER — Inpatient Hospital Stay (HOSPITAL_COMMUNITY): Payer: PPO | Admitting: Physical Therapy

## 2016-11-19 DIAGNOSIS — E8809 Other disorders of plasma-protein metabolism, not elsewhere classified: Secondary | ICD-10-CM

## 2016-11-19 DIAGNOSIS — E876 Hypokalemia: Secondary | ICD-10-CM

## 2016-11-19 DIAGNOSIS — D62 Acute posthemorrhagic anemia: Secondary | ICD-10-CM

## 2016-11-19 DIAGNOSIS — Z86718 Personal history of other venous thrombosis and embolism: Secondary | ICD-10-CM

## 2016-11-19 DIAGNOSIS — I10 Essential (primary) hypertension: Secondary | ICD-10-CM

## 2016-11-19 DIAGNOSIS — G2581 Restless legs syndrome: Secondary | ICD-10-CM

## 2016-11-19 DIAGNOSIS — R609 Edema, unspecified: Secondary | ICD-10-CM

## 2016-11-19 DIAGNOSIS — R339 Retention of urine, unspecified: Secondary | ICD-10-CM

## 2016-11-19 DIAGNOSIS — E46 Unspecified protein-calorie malnutrition: Secondary | ICD-10-CM

## 2016-11-19 LAB — CBC WITH DIFFERENTIAL/PLATELET
BASOS PCT: 0 %
Basophils Absolute: 0 10*3/uL (ref 0.0–0.1)
Eosinophils Absolute: 0.4 10*3/uL (ref 0.0–0.7)
Eosinophils Relative: 8 %
HEMATOCRIT: 28.7 % — AB (ref 36.0–46.0)
Hemoglobin: 9.1 g/dL — ABNORMAL LOW (ref 12.0–15.0)
Lymphocytes Relative: 28 %
Lymphs Abs: 1.3 10*3/uL (ref 0.7–4.0)
MCH: 26.1 pg (ref 26.0–34.0)
MCHC: 31.7 g/dL (ref 30.0–36.0)
MCV: 82.2 fL (ref 78.0–100.0)
MONO ABS: 0.4 10*3/uL (ref 0.1–1.0)
MONOS PCT: 8 %
NEUTROS ABS: 2.6 10*3/uL (ref 1.7–7.7)
Neutrophils Relative %: 56 %
Platelets: 152 10*3/uL (ref 150–400)
RBC: 3.49 MIL/uL — ABNORMAL LOW (ref 3.87–5.11)
RDW: 15.9 % — AB (ref 11.5–15.5)
WBC: 4.6 10*3/uL (ref 4.0–10.5)

## 2016-11-19 LAB — COMPREHENSIVE METABOLIC PANEL
ALBUMIN: 2.4 g/dL — AB (ref 3.5–5.0)
ALT: 24 U/L (ref 14–54)
ANION GAP: 7 (ref 5–15)
AST: 23 U/L (ref 15–41)
Alkaline Phosphatase: 71 U/L (ref 38–126)
BILIRUBIN TOTAL: 0.4 mg/dL (ref 0.3–1.2)
BUN: 14 mg/dL (ref 6–20)
CO2: 34 mmol/L — ABNORMAL HIGH (ref 22–32)
Calcium: 8.8 mg/dL — ABNORMAL LOW (ref 8.9–10.3)
Chloride: 97 mmol/L — ABNORMAL LOW (ref 101–111)
Creatinine, Ser: 0.85 mg/dL (ref 0.44–1.00)
GLUCOSE: 98 mg/dL (ref 65–99)
POTASSIUM: 3.2 mmol/L — AB (ref 3.5–5.1)
Sodium: 138 mmol/L (ref 135–145)
TOTAL PROTEIN: 5.4 g/dL — AB (ref 6.5–8.1)

## 2016-11-19 MED ORDER — POTASSIUM CHLORIDE CRYS ER 20 MEQ PO TBCR
30.0000 meq | EXTENDED_RELEASE_TABLET | Freq: Two times a day (BID) | ORAL | Status: AC
  Administered 2016-11-19 – 2016-11-20 (×3): 30 meq via ORAL
  Filled 2016-11-19 (×3): qty 1

## 2016-11-19 MED ORDER — POTASSIUM CHLORIDE CRYS ER 20 MEQ PO TBCR: 20.0000 meq | EXTENDED_RELEASE_TABLET | Freq: Two times a day (BID) | ORAL | Status: DC

## 2016-11-19 MED ORDER — ENOXAPARIN SODIUM 40 MG/0.4ML ~~LOC~~ SOLN
40.0000 mg | SUBCUTANEOUS | Status: DC
  Administered 2016-11-19 – 2016-12-01 (×13): 40 mg via SUBCUTANEOUS
  Filled 2016-11-19 (×15): qty 0.4

## 2016-11-19 MED ORDER — POTASSIUM CHLORIDE CRYS ER 20 MEQ PO TBCR
20.0000 meq | EXTENDED_RELEASE_TABLET | Freq: Every day | ORAL | Status: DC
  Administered 2016-11-21 – 2016-12-02 (×12): 20 meq via ORAL
  Filled 2016-11-19 (×12): qty 1

## 2016-11-19 MED ORDER — PRO-STAT SUGAR FREE PO LIQD
30.0000 mL | Freq: Two times a day (BID) | ORAL | Status: DC
  Administered 2016-11-19 – 2016-11-28 (×10): 30 mL via ORAL
  Filled 2016-11-19 (×26): qty 30

## 2016-11-19 NOTE — Evaluation (Signed)
Occupational Therapy Assessment and Plan  Patient Details  Name: [name] MRN: [id number] Date of Birth: 1940/05/13  OT Diagnosis: acute pain and muscle weakness (generalized) Rehab Potential: Rehab Potential (ACUTE ONLY): Good ELOS: 12-14 days   Today's Date: 11/19/2016 OT Individual Time: 0800-0902 OT Individual Time Calculation (min): 62 min     Problem List:  Patient Active Problem List   Diagnosis Date Noted  . History of DVT (deep vein thrombosis)   . Benign essential HTN   . RLS (restless legs syndrome)   . Urinary retention   . Hypokalemia   . Hypoalbuminemia due to protein-calorie malnutrition ([place])   . Acute blood loss anemia   . Lumbar stenosis with neurogenic claudication 11/18/2016  . Spondylolisthesis of lumbar region 11/12/2016  . Hereditary hemochromatosis ([place]) 07/28/2016  . Prothrombin gene mutation ([place]) 05/30/2013  . Right leg DVT ([place]) 05/30/2013  . Hemochromatosis 07/29/2012    Past Medical History:  Past Medical History:  Diagnosis Date  . Anemia   . Arthritis   . Bell's palsy   . GERD (gastroesophageal reflux disease)   . Hemochromatosis   . History of hiatal hernia   . Hypertension   . PE (pulmonary embolism)   . Pneumonia    "walking" pneumonia  . Prothrombin gene mutation ([place]) 05/30/2013  . Restless legs   . Right leg DVT ([place]) 05/30/2013  . Stroke [facility])    found on a MRI, she's not aware otherwise   Past Surgical History:  Past Surgical History:  Procedure Laterality Date  . BACK SURGERY    . COLONOSCOPY    . SHOULDER SURGERY Bilateral     Assessment & Plan Clinical Impression: Patient is a 76 y.o. year old female with recent admission to the hospital.   She elected to undergo redo L4/5 and L5-S1 decompressive laminectomy with nerve root decompression on 11/12/16 by Dr. Arnoldo Morale. Post op with LLE numbness with EHL weakness due to displacement of L5 pedicle screw.  Patient transferred to CIR on 11/18/2016 .    Patient currently  requires max with basic self-care skills secondary to muscle weakness, impaired timing and sequencing, unbalanced muscle activation and decreased coordination and decreased standing balance and decreased balance strategies.  Prior to hospitalization, patient could complete ADLs with modified independent .  Patient will benefit from skilled intervention to decrease level of assist with basic self-care skills and increase independence with basic self-care skills prior to discharge home with care partner.  Anticipate patient will require 24 hour supervision and follow up home health.  OT - End of Session Activity Tolerance: Tolerates 30+ min activity with multiple rests Endurance Deficit: Yes OT Assessment Rehab Potential (ACUTE ONLY): Good OT Patient demonstrates impairments in the following area(s): Balance;Motor;Pain;Safety;Endurance OT Basic ADL's Functional Problem(s): Grooming;Bathing;Dressing;Toileting OT Advanced ADL's Functional Problem(s): Simple Meal Preparation OT Transfers Functional Problem(s): Toilet;Tub/Shower OT Plan OT Intensity: Minimum of 1-2 x/day, 45 to 90 minutes OT Frequency: 5 out of 7 days OT Duration/Estimated Length of Stay: 12-14 days OT Treatment/Interventions: Medical illustrator training;Community reintegration;Discharge planning;DME/adaptive equipment instruction;Neuromuscular re-education;Functional mobility training;Pain management;Patient/family education;Splinting/orthotics;Self Care/advanced ADL retraining;Therapeutic Activities;UE/LE Strength taining/ROM;UE/LE Coordination activities;Therapeutic Exercise;Wheelchair propulsion/positioning OT Self Feeding Anticipated Outcome(s): independent OT Basic Self-Care Anticipated Outcome(s): supervision OT Toileting Anticipated Outcome(s): supervision OT Bathroom Transfers Anticipated Outcome(s): supervision OT Recommendation Patient destination: Home Follow Up Recommendations: Home health OT;24 hour  supervision/assistance Equipment Recommended: To be determined   Skilled Therapeutic Intervention Pt began working on self care retraining sit to stand at the EOB, adhering  to her back precautions.  Pt unable to state any of her back precautions this session.  Supervision for UB bathing with mod assist for donning TLSO in sitting.  She needs mod assist for sit to stand from the EOB.  Max assist for threading underpants, pants, and gripper socks.  Decreased ability to advance the LLE during transfers secondary to foot drop.  Pt left in bedside recliner at end of session with call button and phone in reach.    OT Evaluation Precautions/Restrictions  Precautions Precautions: Back;Fall Precaution Comments: No twisting/bending, no lifting over 5 lbs, no driving for 2 weeks  Required Braces or Orthoses: Spinal Brace Spinal Brace: Lumbar corset;Applied in sitting position Other Brace/Splint: Left AFO Restrictions Weight Bearing Restrictions: No  Pain Pain Assessment Pain Assessment: 0-10 Pain Score: 0-No pain Pain Type: Acute pain Pain Location: Leg Pain Orientation: Left Pain Descriptors / Indicators: Aching Pain Frequency: Constant Pain Onset: On-going Patients Stated Pain Goal: 0 Pain Intervention(s): Medication (See eMAR) Multiple Pain Sites: No Home Living/Prior Functioning Home Living Available Help at Discharge: Family, Available 24 hours/day Type of Home: House Home Access: Stairs to enter [employer] of Steps: 2 in front, 3 in back  Home Layout: One level Bathroom Shower/Tub: Multimedia programmer: Handicapped height Bathroom Accessibility: Yes Additional Comments: pt has toilet riser and rollator at home  Lives With: Spouse IADL History Homemaking Responsibilities: Yes Meal Prep Responsibility: Primary Current License: Yes Mode of Transportation: Musician Occupation: Retired Prior Function Level of Independence: Independent with basic ADLs  Able to  Take Stairs?: Yes Driving: Yes Vocation: Retired Biomedical scientist: Stays w/ 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 2 days/week (64 and 69 years old, makes meals for them)  ADL  See Function section of chart for details Vision Baseline Vision/History: Wears glasses Wears Glasses: At all times Patient Visual Report: No change from baseline Vision Assessment?: No apparent visual deficits Perception  Perception: Within Functional Limits Praxis Praxis: Intact Cognition Overall Cognitive Status: Within Functional Limits for tasks assessed Arousal/Alertness: Awake/alert Orientation Level: Person;Place;Situation Person: Oriented Place: Oriented Situation: Oriented Year: 2018 Month: July Day of Week: Correct Memory: Appears intact Immediate Memory Recall: Sock;Blue;Bed Memory Recall: Bed;Blue;Sock Memory Recall Sock: Without Cue Memory Recall Blue: Without Cue Memory Recall Bed: Without Cue Attention: Focused;Sustained Focused Attention: Appears intact Sustained Attention: Appears intact Awareness: Appears intact Problem Solving: Appears intact Safety/Judgment: Appears intact Comments: Aware of post-op precautions and her need to have someone with her to get up at this point in time Sensation Sensation Light Touch: Appears Intact Stereognosis: Appears Intact Hot/Cold: Appears Intact Proprioception: Appears Intact Additional Comments: sensation intact in BUEs Coordination Gross Motor Movements are Fluid and Coordinated: Yes Fine Motor Movements are Fluid and Coordinated: Yes Motor  Motor Motor - Skilled Clinical Observations: left foot drop, decreased strength in LEs as well.  Mobility  Transfers Transfers: Sit to Stand;Stand to Sit Sit to Stand: 3: Mod assist;From bed;With upper extremity assist Stand to Sit: 3: Mod assist;With armrests;To chair/3-in-1;With upper extremity assist  Trunk/Postural Assessment  Cervical Assessment Cervical Assessment: Within Functional Limits Thoracic  Assessment Thoracic Assessment: Within Functional Limits Lumbar Assessment Lumbar Assessment: Exceptions to [facility] (lumbar corsett secondary to sx) Postural Control Postural Control: Within Functional Limits  Balance Balance Balance Assessed: Yes Dynamic Sitting Balance Dynamic Sitting - Balance Support: No upper extremity supported Dynamic Sitting - Level of Assistance: 5: Stand by assistance Static Standing Balance Static Standing - Balance Support: Bilateral upper extremity supported;During functional activity Static Standing - Level of Assistance:  4: Min assist Dynamic Standing Balance Dynamic Standing - Balance Support: Bilateral upper extremity supported;During functional activity Dynamic Standing - Level of Assistance: 3: Mod assist Extremity/Trunk Assessment RUE Assessment RUE Assessment: Exceptions to [facility] (Pt with history of rotator cuff tear.  AROM shoulder flexion 0-100 degrees, all other joints AROM WFLs with grip strength 4/5) LUE Assessment LUE Assessment: Within Functional Limits   See Function Navigator for Current Functional Status.   Refer to Care Plan for Long Term Goals  Recommendations for other services: None    Discharge Criteria: Patient will be discharged from OT if patient refuses treatment 3 consecutive times without medical reason, if treatment goals not met, if there is a change in medical status, if patient makes no progress towards goals or if patient is discharged from hospital.  The above assessment, treatment plan, treatment alternatives and goals were discussed and mutually agreed upon: by patient  Ebb Carelock OTR/L 11/19/2016, 4:40 PM

## 2016-11-19 NOTE — Progress Notes (Signed)
Patient information reviewed and entered into eRehab system by Javier Gell, RN, CRRN, PPS Coordinator.  Information including medical coding and functional independence measure will be reviewed and updated through discharge.     Per nursing patient was given "Data Collection Information Summary for Patients in Inpatient Rehabilitation Facilities with attached "Privacy Act Statement-Health Care Records" upon admission.  

## 2016-11-19 NOTE — Evaluation (Signed)
Physical Therapy Assessment and Plan  Patient Details  Name: [name] MRN: [id number] Date of Birth: 1941/03/03  PT Diagnosis: Abnormality of gait, Difficulty walking, Muscle weakness and Pain in joint Rehab Potential: Good ELOS: 10-14   Today's Date: 11/19/2016 PT Individual Time: 6004-5997 PT Individual Time Calculation (min): 75 min    Problem List:  Patient Active Problem List   Diagnosis Date Noted  . History of DVT (deep vein thrombosis)   . Benign essential HTN   . RLS (restless legs syndrome)   . Urinary retention   . Hypokalemia   . Hypoalbuminemia due to protein-calorie malnutrition ([place])   . Acute blood loss anemia   . Lumbar stenosis with neurogenic claudication 11/18/2016  . Spondylolisthesis of lumbar region 11/12/2016  . Hereditary hemochromatosis ([place]) 07/28/2016  . Prothrombin gene mutation ([place]) 05/30/2013  . Right leg DVT ([place]) 05/30/2013  . Hemochromatosis 07/29/2012    Past Medical History:  Past Medical History:  Diagnosis Date  . Anemia   . Arthritis   . Bell's palsy   . GERD (gastroesophageal reflux disease)   . Hemochromatosis   . History of hiatal hernia   . Hypertension   . PE (pulmonary embolism)   . Pneumonia    "walking" pneumonia  . Prothrombin gene mutation ([place]) 05/30/2013  . Restless legs   . Right leg DVT ([place]) 05/30/2013  . Stroke [facility])    found on a MRI, she's not aware otherwise   Past Surgical History:  Past Surgical History:  Procedure Laterality Date  . BACK SURGERY    . COLONOSCOPY    . SHOULDER SURGERY Bilateral     Assessment & Plan Clinical Impression: Patient is a 76 y.o. female with history of HTN, PE/DVT, multiple back surgeries who developed recurrent pain radiating to RLE due to L4/5 and L5-S1 spondylolisthesis with recurrent stenosis L4, L5 and S1 nerve roots causing neurogenic claudication. She elected to undergo redo L4/5 and L5-S1 decompressive laminectomy with nerve root decompression on 11/12/16 by Dr.  Arnoldo Morale. Post op with LLE numbness with EHL weakness due to displacement of L5 pedicle screw. She was taken back to OR on 7/19 for revision of lumbar instrumentation and AFO ordered for Left foot drop. Neuropathy improved with addition of neurontin. She has had issues with urinary retention with volumes from 600 cc- 1000 cc and foley replaced yesterday. Therapy ongoing and patient with significant deficits in mobility and CIR recommended by rehab team.  Patient transferred to CIR on 11/18/2016 .   Patient currently requires max with mobility secondary to muscle weakness, decreased cardiorespiratoy endurance, LLE muscle paralysis in DF musculature and decreased sitting balance and decreased standing balance.  Prior to hospitalization, patient was independent  with mobility and lived with Spouse in a House home.  Home access is 2 in front, 3 in back Stairs to enter.  Patient will benefit from skilled PT intervention to maximize safe functional mobility, minimize fall risk and decrease caregiver burden for planned discharge home with intermittent assist.  Anticipate patient will benefit from follow up OP at discharge.  PT - End of Session Activity Tolerance: Tolerates 30+ min activity with multiple rests Endurance Deficit: Yes Endurance Deficit Description: Decreased tolerance to activity  PT Assessment Rehab Potential (ACUTE/IP ONLY): Good PT Barriers to Discharge: Inaccessible home environment;Home environment access/layout PT Barriers to Discharge Comments: stairs to enter  PT Patient demonstrates impairments in the following area(s): Balance;Pain;Endurance;Motor;Sensory PT Transfers Functional Problem(s): Bed Mobility;Bed to Chair;Car;Furniture;Floor PT Locomotion Functional Problem(s):  Ambulation;Wheelchair Mobility;Stairs PT Plan PT Intensity: Minimum of 1-2 x/day ,45 to 90 minutes PT Frequency: 5 out of 7 days PT Duration Estimated Length of Stay: 10-14 PT Treatment/Interventions:  Ambulation/gait training;Community reintegration;Balance/vestibular training;Discharge planning;Disease management/prevention;DME/adaptive equipment instruction;Functional electrical stimulation;Functional mobility training;Neuromuscular re-education;Pain management;Patient/family education;Psychosocial support;Skin care/wound management;Splinting/orthotics;Therapeutic Activities;Therapeutic Exercise;UE/LE Strength taining/ROM;UE/LE Coordination activities;Wheelchair propulsion/positioning PT Transfers Anticipated Outcome(s): Supervision PT Locomotion Anticipated Outcome(s): contact/touching assist  PT Recommendation Follow Up Recommendations: Outpatient PT Patient destination: Home Equipment Recommended: To be determined  Skilled Therapeutic Intervention  Pt in recliner upon arrival and agreeable to therapy, reports pain as detailed below. Pt participated in functional mobility as Min-Mod A for transfers and Max A for gait secondary to L foot drop, fatigue, and LLE balance deficits. Pt requires verbal cues for technique and sequencing of mobility and demonstrates decreased endurance (requires multiple rest breaks). Ended session in recliner, call bell within reach and all needs met.   PT instructed patient in PT Evaluation and initiated treatment intervention; see below for results. PT educated patient in [place], rehab potential, rehab goals, and discharge recommendations.   PT Evaluation Precautions/Restrictions Precautions Precautions: Back;Fall Precaution Comments: No twisting/bending, no lifting over 5 lbs, no driving for 2 weeks  Required Braces or Orthoses: Spinal Brace Spinal Brace: Lumbar corset;Applied in sitting position Other Brace/Splint: Left AFO Restrictions Weight Bearing Restrictions: No General   Vital SignsTherapy Vitals Pulse Rate: 75 BP: 116/66 Patient Position (if appropriate): Sitting Oxygen Therapy SpO2: 98 % O2 Device: Not Delivered Pain Pain Assessment Pain  Assessment: 0-10 Pain Score: 3  Pain Type: Surgical pain (Pain related to back surgery) Pain Location: Leg Pain Orientation: Left Pain Descriptors / Indicators: Aching Pain Frequency: Constant Pain Onset: On-going Patients Stated Pain Goal: 0 Pain Intervention(s): Medication (See eMAR) Multiple Pain Sites: No Home Living/Prior Functioning Home Living Available Help at Discharge: Family;Available 24 hours/day Type of Home: House Home Access: Stairs to enter [employer] of Steps: 2 in front, 3 in back  Home Layout: One level Bathroom Shower/Tub: Tub/shower unit;Walk-in shower Bathroom Toilet: Handicapped height Bathroom Accessibility: Yes  Lives With: Spouse Prior Function Level of Independence: Independent with homemaking with ambulation;Independent with basic ADLs  Able to Take Stairs?: Yes Driving: Yes Vocation: Retired Biomedical scientist: Stays w/ 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 2 days/week (71 and 53 years old, makes meals for them)  Vision/Perception  Perception Perception: Within Functional Limits Praxis Praxis: Intact  Cognition Overall Cognitive Status: Within Functional Limits for tasks assessed Arousal/Alertness: Awake/alert Orientation Level: Oriented X4 Attention: Focused Focused Attention: Appears intact Memory: Appears intact Awareness: Appears intact Problem Solving: Appears intact Safety/Judgment: Appears intact Comments: Aware of post-op precautions and her need to have someone with her to get up at this point in time Sensation Sensation Light Touch: Impaired by gross assessment (LLE - decreased sensation ankle and foot grossly) Proprioception: Appears Intact Motor  Motor Motor: Other (comment) (LLE foot drop )  Mobility Bed Mobility Bed Mobility: Rolling Right;Rolling Left;Sit to Supine;Supine to Sit Rolling Right: 4: Min guard Rolling Left: 4: Min guard Supine to Sit: 4: Min assist Supine to Sit Details: Verbal cues for technique;Verbal cues  for sequencing;Verbal cues for precautions/safety Sit to Supine: 4: Min guard Transfers Transfers: Yes Sit to Stand: 4: Min assist Sit to Stand Details: Verbal cues for technique;Verbal cues for sequencing Stand to Sit: 4: Min assist Stand to Sit Details (indicate cue type and reason): Tactile cues for placement Stand Pivot Transfers: 3: Mod assist Stand Pivot Transfer Details: Verbal cues for technique;Verbal cues for  precautions/safety;Verbal cues for sequencing Locomotion  Ambulation Ambulation: Yes Ambulation/Gait Assistance: 3: Mod assist Ambulation Distance (Feet): 5 Feet Assistive device: Rolling walker Ambulation/Gait Assistance Details: Visual cues for safe use of DME/AE;Visual cues/gestures for precautions/safety;Verbal cues for sequencing;Verbal cues for technique;Verbal cues for gait pattern Ambulation/Gait Assistance Details: Verbal cues for LLE foot placement and RW management  Gait Gait: Yes Gait Pattern: Impaired Gait Pattern: Step-to pattern;Decreased step length - left;Trunk flexed;Wide base of support;Poor foot clearance - left Gait velocity: Decreased  Stairs / Additional Locomotion Stairs: Yes Stairs Assistance: 2: Max Industrial/product designer Assistance Details: Verbal cues for technique Stairs Assistance Details (indicate cue type and reason): Max A to correct LOB and verbal cues for technique, LOB related to single leg stance on L  Stair Management Technique: Two rails Number of Stairs: 4 Height of Stairs: 6 Wheelchair Mobility Wheelchair Mobility: Yes Wheelchair Assistance: 4: Advertising account executive Details: Verbal cues for Marketing executive: Both upper extremities Wheelchair Parts Management: Needs assistance Distance: 50'  Trunk/Postural Assessment  Cervical Assessment Cervical Assessment: Within Functional Limits Thoracic Assessment Thoracic Assessment: Within Functional Limits Lumbar Assessment Lumbar Assessment: Exceptions to [facility]  (Lumbar post-op precautions limiting movement) Postural Control Postural Control: Within Functional Limits  Balance Balance Balance Assessed: Yes Static Sitting Balance Static Sitting - Balance Support: No upper extremity supported Static Sitting - Level of Assistance: 5: Stand by assistance Dynamic Sitting Balance Dynamic Sitting - Balance Support: No upper extremity supported Dynamic Sitting - Level of Assistance: 5: Stand by assistance Dynamic Sitting Balance - Compensations: WFL within post-op precautions for bending/twisting to reach w/ UE  Static Standing Balance Static Standing - Balance Support: Bilateral upper extremity supported Static Standing - Level of Assistance: 4: Min assist Static Standing - Comment/# of Minutes: Min A to maintain static standing balance on RW Dynamic Standing Balance Dynamic Standing - Balance Support: Bilateral upper extremity supported Dynamic Standing - Level of Assistance: 3: Mod assist Dynamic Standing - Comments: Mod A to maintain dynamic standing balance during gait Extremity Assessment  RUE Assessment RUE Assessment: Within Functional Limits LUE Assessment LUE Assessment: Within Functional Limits RLE Assessment RLE Assessment: Exceptions to [facility] (4/5 globally) LLE Assessment LLE Assessment: Exceptions to [facility] except 1/5 DF/EV/INV, 3/5 HS and quad strength)   See Function Navigator for Current Functional Status.   Refer to Care Plan for Long Term Goals  Recommendations for other services: None   Discharge Criteria: Patient will be discharged from PT if patient refuses treatment 3 consecutive times without medical reason, if treatment goals not met, if there is a change in medical status, if patient makes no progress towards goals or if patient is discharged from hospital.  The above assessment, treatment plan, treatment alternatives and goals were discussed and mutually agreed upon: by patient  Jozalyn Baglio K Arnette 11/19/2016, 4:40 PM

## 2016-11-19 NOTE — Progress Notes (Signed)
[place] PHYSICAL MEDICINE & REHABILITATION     PROGRESS NOTE  Subjective/Complaints:  Pt seen laying in bed this AM.  She slept well overnight.  She is ready to being her day of therapies.   ROS: Denies CP, SOB, N/V/D.  Objective: Vital Signs: Blood pressure (!) 140/50, pulse 62, temperature 98.9 F (37.2 C), temperature source Oral, resp. rate 16, height 5\' 2"  (1.575 m), weight 72.8 kg (160 lb 8.4 oz), SpO2 99 %. No results found.  Recent Labs  11/19/16 0540  WBC 4.6  HGB 9.1*  HCT 28.7*  PLT 152    Recent Labs  11/19/16 0540  NA 138  K 3.2*  CL 97*  GLUCOSE 98  BUN 14  CREATININE 0.85  CALCIUM 8.8*   CBG (last 3)  No results for input(s): GLUCAP in the last 72 hours.  Wt Readings from Last 3 Encounters:  11/19/16 72.8 kg (160 lb 8.4 oz)  11/13/16 90.7 kg (200 lb)  11/03/16 82.9 kg (182 lb 12.2 oz)    Physical Exam:  BP (!) 140/50 (BP Location: Left Arm)   Pulse 62   Temp 98.9 F (37.2 C) (Oral)   Resp 16   Ht 5\' 2"  (1.575 m)   Wt 72.8 kg (160 lb 8.4 oz)   SpO2 99%   BMI 29.36 kg/m  Constitutional: She appears well-developedand well-nourished.  HENT: Normocephalicand atraumatic.  Eyes: EOMI. No discharge. Cardiovascular: Normal rateand regular rhythm. No JVD. Respiratory: Effort normaland breath sounds normal. GI: Soft. Bowel sounds are normal.   Musculoskeletal: She exhibits no edemaor tenderness.  Neurological: She is alertand oriented  Speech clear.  Motor: B/l UE 5/5.  RLE: 4-/5 HF, 4/5 KE, 5/5 ADF/PF LLE: HF 4/5, KE 4+/5, ADF/PF 0/5.  Skin: Skin is warmand dry. Dry dressing on back incision Psychiatric: She has a normal mood and affect. Her behavior is normal. Judgmentand thought contentnormal.   Assessment/Plan: 1. Functional deficits secondary to lumbar stenosis/L4-S1 radiculopathy s/p lumbar decompression and fusion  which require 3+ hours per day of interdisciplinary therapy in a comprehensive inpatient rehab  setting. Physiatrist is providing close team supervision and 24 hour management of active medical problems listed below. Physiatrist and rehab team continue to assess barriers to discharge/monitor patient progress toward functional and medical goals.  Function:  Bathing Bathing position      Bathing parts      Bathing assist        Upper Body Dressing/Undressing Upper body dressing                    Upper body assist        Lower Body Dressing/Undressing Lower body dressing                                  Lower body assist        Toileting Toileting          Toileting assist     Transfers Chair/bed transfer             Locomotion Ambulation           Wheelchair          Cognition Comprehension Comprehension assist level: Follows complex conversation/direction with no assist  Expression Expression assist level: Expresses complex ideas: With no assist  Social Interaction Social Interaction assist level: Interacts appropriately with others - No medications needed.  Problem Solving Problem solving assist  level: Solves complex problems: Recognizes & self-corrects  Memory      Medical Problem List and Plan: 1. Left lower extremity weakness and functional deficitssecondary to lumbar stenosis/L4-S1 radiculopathy s/p lumbar decompression and fusion (with re-do)  Begin CIR  Left AFO/PRAFO  Notes reviewed, CT images reviewed 2. H/o PE/DVT Prophylaxis/Anticoagulation: Mechanical: Sequential compression devices, below knee Bilateral lower extremities  Vascular study pending  Will consider chemical prophylaxis as well 3. Pain Management: Continue oxycodone with flexeril prn.  4. Mood: LCSW to follow for evaluation and support.  5. Neuropsych: This patient iscapable of making decisions on herown behalf. 6. Skin/Wound Care: Monitor wound for healing.  7. Fluids/Electrolytes/Nutrition: Monitor I/O.  8. HTN: Monitor BP bid. On  Norvasc, Inderal and HCTZ  Monitor with increased mobility 9. Hemochromatosis: Stable. Has not had to be phlebotomized in 6 years.  10. Restless leg syndrome: On Mirapex 11. Urinary retention:   D/ced foley  Bladder training.   UA/UCS pending.  12. Constipation: Increased senna to bid. 13. Hypokalemia  K+ 3.2 on 7/25  Will supplement 14. Hypoalbuminemia  Supplement initiated 7/25 15. ABLA   Hb 9.1 on 7/25  Cont to monitor  LOS (Days) 1 A FACE TO FACE EVALUATION WAS PERFORMED  Halyn Flaugher Lorie Phenix 11/19/2016 8:18 AM

## 2016-11-19 NOTE — Progress Notes (Signed)
*  PRELIMINARY RESULTS* Vascular Ultrasound Bilateral lower extremity venous duplex has been completed.  Preliminary findings: Chronic deep vein thrombosis in the right femoral vein, no acute deep vein thrombosis bilaterally.  Negative for bakers cysts bilaterally.  Everrett Coombe 11/19/2016, 3:14 PM

## 2016-11-19 NOTE — Progress Notes (Signed)
Orthopedic Tech Progress Note Patient Details:  [name] Sep 14, 1940 [id number]  Patient ID: [name], female   DOB: 11/10/1940, 76 y.o.   MRN: [id number]   Hildred Priest 11/19/2016, 10:08 AM Called in bio-tech brace order; spoke with Bella Kennedy

## 2016-11-19 NOTE — Progress Notes (Signed)
Occupational Therapy Session Note  Patient Details  Name: [name] MRN: [id number] Date of Birth: 1940-12-28  Today's Date: 11/19/2016 OT Individual Time: 1300-1400 OT Individual Time Calculation (min): 60 min    Short Term Goals: Week 1:  OT Short Term Goal 1 (Week 1): Pt will peform LB bathing sit to stand with supervision. OT Short Term Goal 2 (Week 1): Pt will complete LB dressing sit to stand with min assist using AE PRN. OT Short Term Goal 3 (Week 1): Pt will complete toilet transfers with min assist and min instructional cueing for sequencing walker usage.   OT Short Term Goal 4 (Week 1): Pt will complete walk-in shower transfers with min assist using RW and shower seat.  Skilled Therapeutic Interventions/Progress Updates:  Pt educated on AE use in order to adhere to back precautions during session.  She was able to return demonstrate use of a reacher, and sock aide with mod demonstrational cueing and min assist for donning and doffing socks.  Also educated on use of a shoe funnel and a reacher for donning the right shoe.  She needed max assist for donning the left shoe and AFO.  Completed toilet transfers with mod assist and max demonstrational cueing to walk into the bathroom and transfer to the toilet.  Pt with difficulty sequencing stepping and walker during transfer.  Once finished pt was positioned in bedside recliner with call button and phone in reach.  Pt's daughter also present for session.     Therapy Documentation Precautions:  Precautions Precautions: Back, Fall Precaution Comments: No twisting/bending, no lifting over 5 lbs, no driving for 2 weeks  Required Braces or Orthoses: Spinal Brace Spinal Brace: Lumbar corset, Applied in sitting position Other Brace/Splint: Left AFO Restrictions Weight Bearing Restrictions: No  Pain: Pain Assessment Pain Assessment: 0-10 Pain Score: 7  Faces Pain Scale: Hurts a little bit Pain Type: Acute pain Pain Location:  Leg Pain Orientation: Left Pain Descriptors / Indicators: Aching Pain Frequency: Constant Pain Onset: On-going Patients Stated Pain Goal: 0 Pain Intervention(s): Medication (See eMAR) Multiple Pain Sites: No ADL: See Function Navigator for Current Functional Status.   Therapy/Group: Individual Therapy  Analuisa Tudor OTR/L 11/19/2016, 5:28 PM

## 2016-11-20 ENCOUNTER — Inpatient Hospital Stay (HOSPITAL_COMMUNITY): Payer: PPO | Admitting: Occupational Therapy

## 2016-11-20 ENCOUNTER — Inpatient Hospital Stay (HOSPITAL_COMMUNITY): Payer: PPO | Admitting: Physical Therapy

## 2016-11-20 DIAGNOSIS — M792 Neuralgia and neuritis, unspecified: Secondary | ICD-10-CM

## 2016-11-20 DIAGNOSIS — I82511 Chronic embolism and thrombosis of right femoral vein: Secondary | ICD-10-CM

## 2016-11-20 LAB — URINALYSIS, COMPLETE (UACMP) WITH MICROSCOPIC
BILIRUBIN URINE: NEGATIVE
GLUCOSE, UA: NEGATIVE mg/dL
Hgb urine dipstick: NEGATIVE
KETONES UR: NEGATIVE mg/dL
Nitrite: NEGATIVE
PH: 8 (ref 5.0–8.0)
PROTEIN: NEGATIVE mg/dL
SQUAMOUS EPITHELIAL / LPF: NONE SEEN
Specific Gravity, Urine: 1.008 (ref 1.005–1.030)

## 2016-11-20 MED ORDER — GABAPENTIN 100 MG PO CAPS
100.0000 mg | ORAL_CAPSULE | Freq: Three times a day (TID) | ORAL | Status: DC
  Administered 2016-11-20 – 2016-11-22 (×3): 100 mg via ORAL
  Filled 2016-11-20 (×6): qty 1

## 2016-11-20 MED ORDER — LIDOCAINE 5 % EX PTCH
1.0000 | MEDICATED_PATCH | CUTANEOUS | Status: DC
  Administered 2016-11-20 – 2016-12-01 (×4): 1 via TRANSDERMAL
  Filled 2016-11-20 (×11): qty 1

## 2016-11-20 MED ORDER — GABAPENTIN 100 MG PO CAPS: 100.0000 mg | ORAL_CAPSULE | Freq: Three times a day (TID) | ORAL | Status: DC

## 2016-11-20 NOTE — Progress Notes (Signed)
Reported pain in left leg is killing her, worse than ever before, even after pain medication. Repositioned for comfort, elevated extremity, etc without success. Pt given inderal as ordered for RLS and reports that when the pain starts like this she just has to move around. Algis Liming PAC notified of discomfort unrelieved with medications and non med interventions. Margarito Liner

## 2016-11-20 NOTE — Progress Notes (Signed)
[facility] PHYSICAL MEDICINE & REHABILITATION     PROGRESS NOTE  Subjective/Complaints:  Pt seen laying in bed this AM.  She states she slept well overnight, but is having increased tremors in her UE.  ROS: +Intention tremors. Denies CP, SOB, N/V/D.  Objective: Vital Signs: Blood pressure 128/60, pulse 70, temperature 98.3 F (36.8 C), temperature source Oral, resp. rate 16, height 5\' 2"  (1.575 m), weight 97 kg (213 lb 13.5 oz), SpO2 98 %. No results found.  Recent Labs  11/19/16 0540  WBC 4.6  HGB 9.1*  HCT 28.7*  PLT 152    Recent Labs  11/19/16 0540  NA 138  K 3.2*  CL 97*  GLUCOSE 98  BUN 14  CREATININE 0.85  CALCIUM 8.8*   CBG (last 3)  No results for input(s): GLUCAP in the last 72 hours.  Wt Readings from Last 3 Encounters:  11/19/16 97 kg (213 lb 13.5 oz)  11/13/16 90.7 kg (200 lb)  11/03/16 82.9 kg (182 lb 12.2 oz)    Physical Exam:  BP 128/60 (BP Location: Right Arm)   Pulse 70   Temp 98.3 F (36.8 C) (Oral)   Resp 16   Ht 5\' 2"  (1.575 m)   Wt 97 kg (213 lb 13.5 oz)   SpO2 98%   BMI 39.11 kg/m  Constitutional: She appears well-developedand well-nourished.  HENT: Normocephalicand atraumatic.  Eyes: EOMI. No discharge. Cardiovascular: RRR. No JVD. Respiratory: Effort normal and breath sounds normal. GI: Soft. Bowel sounds are normal.   Musculoskeletal: She exhibits no edemaor tenderness.  Neurological: She is alertand oriented  Speech clear.  Motor: B/l UE 5/5.  RLE: 4/5 HF, 4+/5 KE, 5/5 ADF/PF LLE: HF 4/5, KE 4+/5, ADF/PF 0/5.  Skin: Skin is warmand dry. Dry dressing on back incision, not examined today Psychiatric: She has a normal mood and affect. Her behavior is normal. Judgmentand thought contentnormal.   Assessment/Plan: 1. Functional deficits secondary to lumbar stenosis/L4-S1 radiculopathy s/p lumbar decompression and fusion  which require 3+ hours per day of interdisciplinary therapy in a comprehensive inpatient rehab  setting. Physiatrist is providing close team supervision and 24 hour management of active medical problems listed below. Physiatrist and rehab team continue to assess barriers to discharge/monitor patient progress toward functional and medical goals.  Function:  Bathing Bathing position   Position: Sitting EOB  Bathing parts Body parts bathed by patient: Right arm, Left arm, Chest, Abdomen, Right upper leg, Left upper leg Body parts bathed by helper: Right lower leg, Left lower leg, Front perineal area, Buttocks, Back  Bathing assist        Upper Body Dressing/Undressing Upper body dressing   What is the patient wearing?: Pull over shirt/dress, Orthosis     Pull over shirt/dress - Perfomed by patient: Thread/unthread right sleeve, Thread/unthread left sleeve, Put head through opening, Pull shirt over trunk       Orthosis activity level: Performed by helper  Upper body assist        Lower Body Dressing/Undressing Lower body dressing   What is the patient wearing?: Underwear, Pants, Non-skid slipper socks, Shoes Underwear - Performed by patient: Pull underwear up/down Underwear - Performed by helper: Thread/unthread right underwear leg, Thread/unthread left underwear leg Pants- Performed by patient: Pull pants up/down Pants- Performed by helper: Thread/unthread right pants leg, Thread/unthread left pants leg   Non-skid slipper socks- Performed by helper: Don/doff right sock, Don/doff left sock       Shoes - Performed by helper: Don/doff right  shoe, Don/doff left shoe, Fasten right, Fasten left          Lower body assist        Toileting Toileting          Toileting assist     Transfers Chair/bed transfer   Chair/bed transfer method: Stand pivot Chair/bed transfer assist level: Moderate assist (Pt 50 - 74%/lift or lower) Chair/bed transfer assistive device: Armrests, Medical sales representative     Max distance: 5 Assist level: Moderate assist (Pt 50  - 74%)   Wheelchair   Type: Manual Max wheelchair distance: 50 Assist Level: Touching or steadying assistance (Pt > 75%)  Cognition Comprehension Comprehension assist level: Follows basic conversation/direction with no assist  Expression Expression assist level: Expresses basic needs/ideas: With no assist  Social Interaction Social Interaction assist level: Interacts appropriately with others - No medications needed.  Problem Solving Problem solving assist level: Solves basic 90% of the time/requires cueing < 10% of the time  Memory Memory assist level: Complete Independence: No helper    Medical Problem List and Plan: 1. Left lower extremity weakness and functional deficitssecondary to lumbar stenosis/L4-S1 radiculopathy s/p lumbar decompression and fusion (with re-do)  Cont CIR  Left AFO/PRAFO 2. H/o PE/DVT Prophylaxis/Anticoagulation: Mechanical: Sequential compression devices, below knee Bilateral lower extremities  Vascular study with chronic DVT in femoral vein  Per Surgery, hold on Lovenox until tomorrow 3. Pain Management: Continue oxycodone with flexeril prn.   Gabapentin d/ced due to potential significant increase in tremors  Lidoderm patch ordered for left foot 4. Mood: LCSW to follow for evaluation and support.  5. Neuropsych: This patient iscapable of making decisions on herown behalf. 6. Skin/Wound Care: Monitor wound for healing.  7. Fluids/Electrolytes/Nutrition: Monitor I/O.  8. HTN: Monitor BP bid. On Norvasc, Inderal and HCTZ  Controlled 7/26  Monitor with increased mobility 9. Hemochromatosis: Stable. Has not had to be phlebotomized in 6 years.  10. Restless leg syndrome: On Mirapex 11. Urinary retention:   D/ced foley  Bladder training.   UA/UCS pending.  12. Constipation: Increased senna to bid. 13. Hypokalemia  K+ 3.2 on 7/25  Supplemented  Cont to monitor 14. Hypoalbuminemia  Supplement initiated 7/25 15. ABLA   Hb 9.1 on 7/25  Cont to  monitor  LOS (Days) 2 A FACE TO FACE EVALUATION WAS PERFORMED  Ankit Lorie Phenix 11/20/2016 8:52 AM

## 2016-11-20 NOTE — Progress Notes (Signed)
Patient reporting sharp/unbearable pain from shin to ankle. . Tremors are gone off gabapentin 300 mg tid. Recommended trying lower dose of 100 mg tid for neuropathy and monitoring for SE.

## 2016-11-20 NOTE — Progress Notes (Signed)
Occupational Therapy Session Note  Patient Details  Name: [name] MRN: [id number] Date of Birth: Aug 27, 1940  Today's Date: 11/20/2016 OT Individual Time: 4076-8088 OT Individual Time Calculation (min): 70 min    Short Term Goals: Week 1:  OT Short Term Goal 1 (Week 1): Pt will peform LB bathing sit to stand with supervision. OT Short Term Goal 2 (Week 1): Pt will complete LB dressing sit to stand with min assist using AE PRN. OT Short Term Goal 3 (Week 1): Pt will complete toilet transfers with min assist and min instructional cueing for sequencing walker usage.   OT Short Term Goal 4 (Week 1): Pt will complete walk-in shower transfers with min assist using RW and shower seat.  Skilled Therapeutic Interventions/Progress Updates:    Pt transferred from the bed to the wheelchair with min assist using the RW with left AFO and back brace in place.  She was able to roll herself to the ADL apartment in the wheelchair.  Practiced walk-in shower transfers with use of the RW and shower seat.  Min assist to complete posterior transfer into the shower stepping over the edge and then anterior to step forward out of the shower.  Discussed the need for 3:1 in the shower to allow for UE use to assist with sit to stand vs shower seat without handles.  Also practice bed transfer to regular bed as well with mod assist for transition from sitting to supine but only needed min assist for transition from supine to sitting.  Returned to room at end of session with toilet transfer and toileting completed with min assist.  Finished session with transition back to bed with min assist.  Pt with call button and phone in reach at end of session.   Therapy Documentation Precautions:  Precautions Precautions: Back, Fall Precaution Comments: No twisting/bending, no lifting over 5 lbs, no driving for 2 weeks  Required Braces or Orthoses: Spinal Brace Spinal Brace: Lumbar corset, Applied in sitting position Other  Brace/Splint: Left AFO Restrictions Weight Bearing Restrictions: No  Pain: Pain Assessment Pain Assessment: 0-10 Pain Score: 7  Pain Location: Foot Pain Orientation: Left Pain Descriptors / Indicators: Aching;Sore Patients Stated Pain Goal: 6 Pain Intervention(s): Medication (See eMAR) ADL: See Function Navigator for Current Functional Status.   Therapy/Group: Individual Therapy  Cathleen Yagi OTR/L 11/20/2016, 4:00 PM

## 2016-11-20 NOTE — Progress Notes (Signed)
Occupational Therapy Session Note  Patient Details  Name: [name] MRN: [id number] Date of Birth: 1940/04/29  Today's Date: 11/20/2016 OT Individual Time: 1006-1101 OT Individual Time Calculation (min): 55 min    Short Term Goals: Week 1:  OT Short Term Goal 1 (Week 1): Pt will peform LB bathing sit to stand with supervision. OT Short Term Goal 2 (Week 1): Pt will complete LB dressing sit to stand with min assist using AE PRN. OT Short Term Goal 3 (Week 1): Pt will complete toilet transfers with min assist and min instructional cueing for sequencing walker usage.   OT Short Term Goal 4 (Week 1): Pt will complete walk-in shower transfers with min assist using RW and shower seat.  Skilled Therapeutic Interventions/Progress Updates:    Pt completed bathing and dressing sit to stand at the sink this session, per her choice.  She did not want to try shower today secondary to increased pain in the left hip and left foot when sitting in the recliner.  Once she transferred to the wheelchair and began bathing and dressing at the sink she her pain was reduced.  Integrated AE for doffing socks, washing and drying feet, and donning new clothing.  Mod instructional cueing with min assist for use of the sock aide and reacher for threading underpants, pants, and donning gripper socks.  Min assist for sit to stand throughout session.  Pt with one LOB to the left when attempting to pull pants over hips, secondary to LLE buckling.  Mod assist to regain balance and finish task.  Pt left up in wheelchair at end of session with call button and phone in reach.    Therapy Documentation Precautions:  Precautions Precautions: Back, Fall Precaution Comments: No twisting/bending, no lifting over 5 lbs, no driving for 2 weeks  Required Braces or Orthoses: Spinal Brace Spinal Brace: Lumbar corset, Applied in sitting position Other Brace/Splint: Left AFO Restrictions Weight Bearing Restrictions: No  Pain: Pain  Assessment Pain Assessment: Faces Pain Score: 5  Faces Pain Scale: Hurts even more Pain Type: Surgical pain Pain Location: Leg Pain Orientation: Left Pain Descriptors / Indicators: Discomfort Pain Onset: On-going Pain Intervention(s): Repositioned;Emotional support ADL: See Function Navigator for Current Functional Status.   Therapy/Group: Individual Therapy  Mechille Varghese OTR/L 11/20/2016, 12:24 PM

## 2016-11-20 NOTE — Progress Notes (Signed)
Physical Therapy Session Note  Patient Details  Name: [name] MRN: [id number] Date of Birth: 03/09/1941  Today's Date: 11/20/2016 PT Individual Time: 3976-7341 PT Individual Time Calculation (min): 40 min   Short Term Goals: Week 1:  PT Short Term Goal 1 (Week 1): Pt will transfer bed<>chair via stand pivot transfer w/ Min A  PT Short Term Goal 2 (Week 1): Pt will ambulate 25 ft w/ Min A w/ RW PT Short Term Goal 3 (Week 1): Pt will maintain dynamic standing balance w/ supervision and UE support for 30 seconds PT Short Term Goal 4 (Week 1): Pt will propel w/c 150 ft w/ supervision using bilateral UEs PT Short Term Goal 5 (Week 1): Pt will negotiate 4 steps w/ Min A w/ bilateral UE support  Skilled Therapeutic Interventions/Progress Updates:  Pt received in w/c reporting significant pain in LLE & back but agreeable to tx. Therapist donned pt's shoes & L AFO total assist for time management and also provided assistance with correct placement of LSO. Pt propelled w/c room>gym with BUE & supervision with more than reasonable amount of time with task focusing on cardiopulmonary endurance training. Pt completes stand pivot w/c<>nu-step with RW & min assist; therapist provides cuing to reach back for stable surface before transferring stand>sit. Pt utilized nu-step on level 3 x 10 minutes with 2 rest breaks 2/2 fatigue & SOB. Educated pt on pursed lip breathing & SpO2 WNL. Pt reported 6-7/10 RPE & activity focused on endurance training & BLE strengthening. Pt returned to room & ambulated 10 ft into bathroom with RW & min assist. Pt demonstrates decreased weight shifting L<>R, impaired coordination LLE, and heavy reliance on BUE. Therapist and RN provided total assist for clothing management & pt left on toilet with RN present.  Therapy Documentation Precautions:  Precautions Precautions: Back, Fall Precaution Comments: No twisting/bending, no lifting over 5 lbs, no driving for 2 weeks  Required  Braces or Orthoses: Spinal Brace Spinal Brace: Lumbar corset, Applied in sitting position Other Brace/Splint: Left AFO Restrictions Weight Bearing Restrictions: No   Pain: "almost a 10" out of 10 in lower back & LLE - premedicated per RN.  See Function Navigator for Current Functional Status.   Therapy/Group: Individual Therapy  [name] 11/20/2016, 3:17 PM

## 2016-11-20 NOTE — Progress Notes (Signed)
Physical Therapy Session Note  Patient Details  Name: [name] MRN: [id number] Date of Birth: 06-Apr-1941  Today's Date: 11/20/2016 PT Individual Time: 0800-0900 PT Individual Time Calculation (min): 60 min   Short Term Goals: Week 1:  PT Short Term Goal 1 (Week 1): Pt will transfer bed<>chair via stand pivot transfer w/ Min A  PT Short Term Goal 2 (Week 1): Pt will ambulate 25 ft w/ Min A w/ RW PT Short Term Goal 3 (Week 1): Pt will maintain dynamic standing balance w/ supervision and UE support for 30 seconds PT Short Term Goal 4 (Week 1): Pt will propel w/c 150 ft w/ supervision using bilateral UEs PT Short Term Goal 5 (Week 1): Pt will negotiate 4 steps w/ Min A w/ bilateral UE support  Skilled Therapeutic Interventions/Progress Updates:   Pt supine in bed upon arrival and agreeable to therapy. 7/10 LLE pain this morning as detailed below, RN present to provide medication. Pt just received breakfast, transitioned to EOB w/ supervision and bedrail support. Pt maintain dynamic sitting balance at EOB while eating breakfast w/ supervision.   Ambulated to/from toilet w/ Min A to balance and RW management. BM as detailed in flowsheet. Pt maintained static sitting balance Mod I at toilet. Assisted pt in donning new shirt and donning/doffing L AFO and shoes.   Pt ambulated 35 ft w/ Min A and w/c follow, seated rest secondary to fatigue.   Ended session in recliner, call bell within reach and all needs met.   Therapy Documentation Precautions:  Precautions Precautions: Back, Fall Precaution Comments: No twisting/bending, no lifting over 5 lbs, no driving for 2 weeks  Required Braces or Orthoses: Spinal Brace Spinal Brace: Lumbar corset, Applied in sitting position Other Brace/Splint: Left AFO Restrictions Weight Bearing Restrictions: No Vital Signs: Therapy Vitals Temp: 98.3 F (36.8 C) Temp Source: Oral Pulse Rate: 70 Resp: 16 BP: 128/60 Patient Position (if appropriate):  Lying Oxygen Therapy SpO2: 98 % O2 Device: Not Delivered Pain: Pain Assessment Pain Assessment: 0-10 Pain Score: 7  Pain Location: Leg Pain Orientation: Left Pain Radiating Towards: buttocks Pain Descriptors / Indicators: Aching Pain Onset: On-going Pain Intervention(s): Medication (See eMAR)  See Function Navigator for Current Functional Status.   Therapy/Group: Individual Therapy  Alecxis Baltzell K Arnette 11/20/2016, 8:06 AM

## 2016-11-21 ENCOUNTER — Inpatient Hospital Stay (HOSPITAL_COMMUNITY): Payer: PPO

## 2016-11-21 ENCOUNTER — Inpatient Hospital Stay (HOSPITAL_COMMUNITY): Payer: PPO | Admitting: Occupational Therapy

## 2016-11-21 DIAGNOSIS — T887XXA Unspecified adverse effect of drug or medicament, initial encounter: Secondary | ICD-10-CM

## 2016-11-21 DIAGNOSIS — R8290 Unspecified abnormal findings in urine: Secondary | ICD-10-CM | POA: Insufficient documentation

## 2016-11-21 DIAGNOSIS — R8299 Other abnormal findings in urine: Secondary | ICD-10-CM

## 2016-11-21 MED ORDER — TEMAZEPAM 7.5 MG PO CAPS
7.5000 mg | ORAL_CAPSULE | Freq: Every evening | ORAL | Status: DC | PRN
  Administered 2016-11-23 – 2016-12-01 (×5): 7.5 mg via ORAL
  Filled 2016-11-21 (×7): qty 1

## 2016-11-21 MED ORDER — BETHANECHOL CHLORIDE 10 MG PO TABS
5.0000 mg | ORAL_TABLET | Freq: Three times a day (TID) | ORAL | Status: DC
  Administered 2016-11-21 – 2016-11-23 (×7): 5 mg via ORAL
  Filled 2016-11-21 (×7): qty 1

## 2016-11-21 MED ORDER — PREGABALIN 50 MG PO CAPS
50.0000 mg | ORAL_CAPSULE | Freq: Three times a day (TID) | ORAL | Status: DC
  Administered 2016-11-21 – 2016-11-23 (×7): 50 mg via ORAL
  Filled 2016-11-21 (×7): qty 1

## 2016-11-21 NOTE — Patient Care Conference (Signed)
Inpatient RehabilitationTeam Conference and Plan of Care Update Date: 11/19/2016   Time: 7:43 AM    Patient Name: [name]      Medical Record Number: [id number]  Date of Birth: 1940-11-28 Sex: Female         Room/Bed: 4M12C/4M12C-01 Payor Info: Payor: [employer] AD[employer]ANTAGE / Plan: [employer] Must / Product Type: *No Product type* /    Admitting Diagnosis: lumbar fusion  Admit Date/Time:  11/18/2016  3:35 PM Admission Comments: No comment available   Primary Diagnosis:  <principal problem not specified> Principal Problem: <principal problem not specified>  Patient Active Problem List   Diagnosis Date Noted  . Neuropathic pain   . History of DVT (deep vein thrombosis)   . Benign essential HTN   . RLS (restless legs syndrome)   . Urinary retention   . Hypokalemia   . Hypoalbuminemia due to protein-calorie malnutrition ([place])   . Acute blood loss anemia   . Lumbar stenosis with neurogenic claudication 11/18/2016  . Spondylolisthesis of lumbar region 11/12/2016  . Hereditary hemochromatosis ([place]) 07/28/2016  . Prothrombin gene mutation ([place]) 05/30/2013  . Right leg DVT ([place]) 05/30/2013  . Hemochromatosis 07/29/2012    Expected Discharge Date: Expected Discharge Date: 12/02/16  Team Members Present: Physician leading conference: Dr. Delice Lesch Social Worker Present: Lennart Pall, LCSW Nurse Present: Other (comment) Patsy Lager., RN) PT Present: Amy Arnette, Gwenith Spitz, PT OT Present: Clyda Greener, OT SLP Present: Charolett Bumpers, SLP PPS Coordinator present : Daiva Nakayama, RN, CRRN     Current Status/Progress Goal Weekly Team Focus  Medical    Left lower extremity weakness and functional deficits secondary to lumbar stenosis/L4-S1 radiculopathy s/p lumbar decompression and fusion (with re-do)  Improve mobility, BP, urinary retention, hypokalemia, ABLA  See above   Bowel/Bladder   patient has follow catheter will discontinue at 6am, continent of stool          Swallow/Nutrition/ Hydration             ADL's   supervision for UB selfcare, mod assist for LB bathing, max assist for LB dressing.  Mod assist for functional transfers with the RW.  supervision overall  selfcare retraining, balance, neuromuscular re-education, pt/family education, DME/AE education   Mobility   min/mod A transfers, max A gait 12ft   supervision  transfers, gait, mobility w/ post-op precautions    Communication             Safety/Cognition/ Behavioral Observations     No reported fall ori njuries while on Rehab department        Pain   Pain present rate pain on scale 0/10 as 7-8 to ack and left leg  pain score toleable 3-4       Skin   skin intact excep for surgical area back          Rehab Goals Patient on target to meet rehab goals: Yes Rehab Goals Revised: none - pt's first conference *See Care Plan and progress notes for long and short-term goals.     Barriers to Discharge  Current Status/Progress Possible Resolutions Date Resolved   Physician    Medical stability;Wound Care;Other (comments)  left foot drop  See above  Therapies, otpmize HTN meds, AFO/PRAFO, foley dced, follow labs      Nursing  Neurogenic Bowel & Bladder  left LE numbness and foot drop, unable to void on own after surgery = foley and now toileting schedule? I+O cath to empty bladder  PT  Inaccessible home environment;Home environment access/layout  stairs to enter               OT                  SLP                SW                Discharge Planning/Teaching Needs:  Pt to return to her home and husband and dtr and other family members to assist.  Family education to occur closer to pt's d/c.   Team Discussion:  Pt just being evaluated.  Foley catheter has been removed and MD is checking labs.  Pt with supervision level goals with OT and PT except for don/doff of TLSO (min to mod A).  Pt is currently max A with lower body. Pt needs education on AE and reinforcement of  back precautions.  Left leg is a barrier for pt.    Revisions to Treatment Plan:  none    Continued Need for Acute Rehabilitation Level of Care: The patient requires daily medical management by a physician with specialized training in physical medicine and rehabilitation for the following conditions: Daily direction of a multidisciplinary physical rehabilitation program to ensure safe treatment while eliciting the highest outcome that is of practical value to the patient.: Yes Daily medical management of patient stability for increased activity during participation in an intensive rehabilitation regime.: Yes Daily analysis of laboratory values and/or radiology reports with any subsequent need for medication adjustment of medical intervention for : Post surgical problems;Neurological problems;Blood pressure problems;Urological problems;Other  Sabino Denning, Silvestre Mesi 11/21/2016, 7:43 AM

## 2016-11-21 NOTE — Progress Notes (Signed)
Physical Therapy Session Note  Patient Details  Name: [name] MRN: [id number] Date of Birth: Sep 17, 1940  Today's Date: 11/21/2016 PT Individual Time: 0920-1030, 1415-1530 PT Individual Time Calculation (min): 70 min , 75 min  Short Term Goals: Week 1:  PT Short Term Goal 1 (Week 1): Pt will transfer bed<>chair via stand pivot transfer w/ Min A  PT Short Term Goal 2 (Week 1): Pt will ambulate 25 ft w/ Min A w/ RW PT Short Term Goal 3 (Week 1): Pt will maintain dynamic standing balance w/ supervision and UE support for 30 seconds PT Short Term Goal 4 (Week 1): Pt will propel w/c 150 ft w/ supervision using bilateral UEs PT Short Term Goal 5 (Week 1): Pt will negotiate 4 steps w/ Min A w/ bilateral UE support  Skilled Therapeutic Interventions/Progress Updates:   Session 1:  Pt supine in bed upon PT arrival, agreeable to therapy tx. Pt transferred from supine to sitting with min assist to maintain spinal precautions. Sitting EOB pt donned back brace with min assist to get straps. Pt performed sit to stand with min assist and ambulated x 15 ft in her room in order to get clothes out of closet to get dressed. Pt worked on dynamic standing balance with single UE support on RW to reach for clothes. Pt sitting EOB doffed dirty shirt and donned bra and clean shirt with supervision. Pt used reacher to loop pants around LEs with min assist. Pt performed sit to stand with min assist in order to pull up shorts over hips. Pt ambulated a 57 ft towards the gym with min assist and using RW, verbal cues for L LE foot placement to aim for front L wheel of RW in order to prevent scissoring. Pt standing with RW performed 1 x 10 hip abduction with R LE but unable to perform with L LE. Pt transferred from standing to sidelying on mat with min assist and verbal cues to maintain precautions. Pt performed 1 x 10 hip abduction with knee bent AAROM. Pt transferred sidelying to supine with verbal cues to maintain  precautions and perform log roll. In supine pt performed 1 x 10 hip abduction with L LE using maxi slide and AAROM, 2 x 10 with maxi slide and AROM to work on hip abductor strengthening as pt presents with trendelenberg gait during ambulation. Pt performed 2 x 10 hip abduction in sidelying with R LE. Pt performed 2 x 10 SAQ with bolster in supine. Pt transferred from supine>sidelying>sitting with min assist to maintain precautions. Pt ambulated x 30 ft using RW and min assist, verbal cues for L foot placement and to increase R LE step length. Pt left sitting in w/c in room with call bell in reach.  Pt reports radiating pain in R LE when sitting EOB, 3/10. Change of position relieved pain.   Session 2: Pt sitting in w/c upon PT arrival, agreeable to therapy tx, denies pain. Pt propelled w/c x 100 ft to the gym using B LEs for strengthening and endurance. Worked on dynamic standing balance, standing on foam to match cards with single UE support, performed again without UE support needing increased assist to maintain balance. Pt standing on foam eyes open x 10 sec and x7 sec without UE support. Standing on foam eyes closed x 4 sec without UE support, increased posterior lean. Pt reported needing to go to bathroom. Pt ambulated from w/c into bathroom x 10 ft using RW. Pt requiring min assist for  dynamic balance to doff and don shorts. Pt ambulated back to w/c with min assist using RW. Pt  Worked on LE strengthening and endurance sitting using kinetron x 3 min. Pt used kinetron in standing to work on LE strengthening, balance and equal weightbearing x 45 sec, x 50 sec and x35 sec. Pt ambulated x 30 ft using RW and min assist. Pt transferred to bed and from sitting to supine with verbal cues for log roll/precautions. Pt left supine in bed with call bell in reach.     Therapy Documentation Precautions:  Precautions Precautions: Back, Fall Precaution Comments: No twisting/bending, no lifting over 5 lbs, no  driving for 2 weeks  Required Braces or Orthoses: Spinal Brace Spinal Brace: Lumbar corset, Applied in sitting position Other Brace/Splint: Left AFO Restrictions Weight Bearing Restrictions: No   See Function Navigator for Current Functional Status.   Therapy/Group: Individual Therapy  [name], PT, DPT 11/21/2016, 7:55 AM

## 2016-11-21 NOTE — Progress Notes (Signed)
Social Work Patient ID: [name], female   DOB: May 28, 1940, 76 y.o.   MRN: [id number]   CSW met with pt 11-20-16 to update her on team conference discussion and complete assessment.  Pt is targeted for d/c on 12-02-16.  She felt that was a long time away, but understood that she may need that much time.  CSW explained that date can be changed based on her progress.  She expressed understanding.  No family present at the time of CSW visit.  Will reach out later.

## 2016-11-21 NOTE — Progress Notes (Signed)
Social Work Assessment and Plan  Patient Details  Name: [name] MRN: [id number] Date of Birth: 1940-05-02  Today's Date: 11/20/2016  Problem List:  Patient Active Problem List   Diagnosis Date Noted  . Neuropathic pain   . History of DVT (deep vein thrombosis)   . Benign essential HTN   . RLS (restless legs syndrome)   . Urinary retention   . Hypokalemia   . Hypoalbuminemia due to protein-calorie malnutrition ([place])   . Acute blood loss anemia   . Lumbar stenosis with neurogenic claudication 11/18/2016  . Spondylolisthesis of lumbar region 11/12/2016  . Hereditary hemochromatosis ([place]) 07/28/2016  . Prothrombin gene mutation ([place]) 05/30/2013  . Right leg DVT ([place]) 05/30/2013  . Hemochromatosis 07/29/2012   Past Medical History:  Past Medical History:  Diagnosis Date  . Anemia   . Arthritis   . Bell's palsy   . GERD (gastroesophageal reflux disease)   . Hemochromatosis   . History of hiatal hernia   . Hypertension   . PE (pulmonary embolism)   . Pneumonia    "walking" pneumonia  . Prothrombin gene mutation ([place]) 05/30/2013  . Restless legs   . Right leg DVT ([place]) 05/30/2013  . Stroke [facility])    found on a MRI, she's not aware otherwise   Past Surgical History:  Past Surgical History:  Procedure Laterality Date  . BACK SURGERY    . COLONOSCOPY    . SHOULDER SURGERY Bilateral    Social History:  reports that she has never smoked. She has never used smokeless tobacco. She reports that she does not drink alcohol or use drugs.  Family / Support Systems Marital Status: Married Patient Roles: Spouse, Parent, Other (Comment) (sister) Spouse/Significant Other: [name] - husband - [phone] Children: [name] - dtr - [phone] Anticipated Caregiver: Husband, daughters, sisters Ability/Limitations of Caregiver: Husband works as an Conservation officer, nature, but can take time off to assist.  One Dtr close by and another 5 miles away.  Sister nearby too. Caregiver  Availability: 24/7 Family Dynamics: supportive family  Social History Preferred language: English Religion: Baptist Read: Yes Write: Yes Employment Status: Retired Public relations account executive Issues: none reporteed Guardian/Conservator: N/A - MD has determined that pt is capable of making her own decisions.   Abuse/Neglect Physical Abuse: Denies Verbal Abuse: Denies Sexual Abuse: Denies Exploitation of patient/patient's resources: Denies Self-Neglect: Denies  Emotional Status Pt's affect, behavior and adjustment status: Pt was in good spirits during assessment visit.  She feels she had to have surgery due to the pain. Recent Psychosocial Issues: none reported Psychiatric History: none reported Substance Abuse History: none reported  Patient / Family Perceptions, Expectations & Goals Pt/Family understanding of illness & functional limitations: Pt reports a good understanding of her condition. Premorbid pt/family roles/activities: Pt enjoys spending time with her 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 and likes to read. Anticipated changes in roles/activities/participation: Pt hopes to resume activities as she is able. Pt/family expectations/goals: Pt wants to get back on her feet and have less pain.  Community Resources Express Scripts: None Premorbid Home Care/DME Agencies: None (Pt has a rolling walker, rollator, and elevated commode seat.) Transportation available at discharge: family Resource referrals recommended: Neuropsychology  Discharge Planning Living Arrangements: Spouse/significant other Support Systems: Spouse/significant other, Children, Other relatives, Social worker community, Friends/neighbors Type of Residence: Private residence Insurance Resources: Multimedia programmer (specify) ([employer] advantage) Museum/gallery curator Resources: Fish farm manager, Family Support Financial Screen Referred: No Money Management: Patient, Spouse Does the patient have any problems obtaining your  medications?:  No Home Management: Pt and husband share this. Patient/Family Preliminary Plans: Pt plans to return to her home with family to provide supervision. Social Work Anticipated Follow Up Needs: HH/OP Expected length of stay: 2 weeks  Clinical Impression CSW met with pt to introduce self and role of CSW, as well as to complete assessment.  Pt is pleased that she is on CIR, but was not expecting to need to stay 2 weeks.  She just hopes to have less pain and to regain her independence.  Pt's husband, dtrs, and sister can assist pt at home.  CSW looks forward to meeting pt's family.  CSW will continue to follow pt and assist as needed.  [name], Silvestre Mesi 11/21/2016, 8:17 AM

## 2016-11-21 NOTE — Progress Notes (Signed)
Occupational Therapy Session Note  Patient Details  Name: [name] MRN: [id number] Date of Birth: 24-Aug-1940  Today's Date: 11/21/2016 OT Individual Time: 1102-1205 OT Individual Time Calculation (min): 63 min    Short Term Goals: Week 1:  OT Short Term Goal 1 (Week 1): Pt will peform LB bathing sit to stand with supervision. OT Short Term Goal 2 (Week 1): Pt will complete LB dressing sit to stand with min assist using AE PRN. OT Short Term Goal 3 (Week 1): Pt will complete toilet transfers with min assist and min instructional cueing for sequencing walker usage.   OT Short Term Goal 4 (Week 1): Pt will complete walk-in shower transfers with min assist using RW and shower seat.  Skilled Therapeutic Interventions/Progress Updates:    Pt completed bathing and dressing during session.  Min assist for transfer into the bathroom to the shower bench with use of the RW.  Once sitting therapist assisted with removal of shoes, left AFO, and socks.  Pt was able to remove all UB clothing and soft back brace with supervision.  She was able to complete UB bathing with supervision and LB bathing sit to stand with min assist using the LH sponge to wash below her knees.  She was able to complete UB dressing sitting on the shower seat with supervision, including back corset.  Reacher utilized for donning underpants and pants.  Therapist assisted with donning socks, shoes, and AFO secondary to decreased time.  Completed session with transfer to the toilet with mod assist and back to the wheelchair.  Pt still with decreased strength in the LEs with left being more impaired and "numb" as pt states.  Pt left in wheelchair with call button and phone in reach.    Therapy Documentation Precautions:  Precautions Precautions: Back, Fall Precaution Booklet Issued: No Precaution Comments: No twisting/bending, no lifting over 5 lbs, no driving for 2 weeks  Required Braces or Orthoses: Spinal Brace Spinal Brace:  Lumbar corset, Applied in sitting position Other Brace/Splint: Left AFO Restrictions Weight Bearing Restrictions: No  Pain: Pain Assessment Pain Assessment: Faces Faces Pain Scale: Hurts little more Pain Type: Surgical pain Pain Location: Back Pain Orientation: Lower Pain Intervention(s): Repositioned ADL: See Function Navigator for Current Functional Status.   Therapy/Group: Individual Therapy  Kashus Karlen OTR/L 11/21/2016, 12:59 PM

## 2016-11-21 NOTE — Progress Notes (Signed)
[facility] Individual Statement of Services  Patient Name:  LIAH MORR  Date:  11/21/2016  Welcome to the [facility].  Our goal is to provide you with an individualized program based on your diagnosis and situation, designed to meet your specific needs.  With this comprehensive rehabilitation program, you will be expected to participate in at least 3 hours of rehabilitation therapies Monday-Friday, with modified therapy programming on the weekends.  Your rehabilitation program will include the following services:  Physical Therapy (PT), Occupational Therapy (OT), 24 hour per day rehabilitation nursing, Case Management (Social Worker), Rehabilitation Medicine, Nutrition Services and Pharmacy Services  Weekly team conferences will be held on Wednesdays to discuss your progress.  Your Social Worker will talk with you frequently to get your input and to update you on team discussions.  Team conferences with you and your family in attendance may also be held.  Expected length of stay: 2 weeks Overall anticipated outcome: Supervision  Depending on your progress and recovery, your program may change. Your Social Worker will coordinate services and will keep you informed of any changes. Your Social Worker's name and contact numbers are listed  below.  The following services may also be recommended but are not provided by the [facility] will be made to provide these services after discharge if needed.  Arrangements include referral to agencies that provide these services.  Your insurance has been verified to be:  [employer] Your primary doctor is:  Dr. Shirline Frees  Pertinent information will be shared with your doctor and your insurance company.  Social Worker:  Alfonse Alpers, LCSW  [phone] or [phone]  Information discussed with and copy given to patient by: Trey Sailors, 11/21/2016, 7:58 AM

## 2016-11-21 NOTE — Progress Notes (Addendum)
[facility] PHYSICAL MEDICINE & REHABILITATION     PROGRESS NOTE  Subjective/Complaints:  Pt seen laying in bed this AM.  She slept well overnight.  Her tremors stopped after d/cing gabapentin, but she had significant increase in pain.  Low dose gabapentin resumed, but she states her tremors returned.  She also requests temazepam.   ROS: +Intention tremors. Denies CP, SOB, N/V/D.  Objective: Vital Signs: Blood pressure (!) 142/60, pulse 68, temperature 98.4 F (36.9 C), temperature source Oral, resp. rate 18, height 5\' 2"  (1.575 m), weight 97 kg (213 lb 13.5 oz), SpO2 96 %. No results found.  Recent Labs  11/19/16 0540  WBC 4.6  HGB 9.1*  HCT 28.7*  PLT 152    Recent Labs  11/19/16 0540  NA 138  K 3.2*  CL 97*  GLUCOSE 98  BUN 14  CREATININE 0.85  CALCIUM 8.8*   CBG (last 3)  No results for input(s): GLUCAP in the last 72 hours.  Wt Readings from Last 3 Encounters:  11/19/16 97 kg (213 lb 13.5 oz)  11/13/16 90.7 kg (200 lb)  11/03/16 82.9 kg (182 lb 12.2 oz)    Physical Exam:  BP (!) 142/60   Pulse 68   Temp 98.4 F (36.9 C) (Oral)   Resp 18   Ht 5\' 2"  (1.575 m)   Wt 97 kg (213 lb 13.5 oz)   SpO2 96%   BMI 39.11 kg/m  Constitutional: She appears well-developedand well-nourished.  HENT: Normocephalicand atraumatic.  Eyes: EOMI. No discharge. Cardiovascular: RRR. No JVD. Respiratory: Effort normal and breath sounds normal. GI: Soft. Bowel sounds are normal.   Musculoskeletal: She exhibits no edemaor tenderness.  Neurological: She is alertand oriented  Speech clear.  Motor: B/l UE 5/5.  RLE: 4/5 HF, 4+/5 KE, 5/5 ADF/PF LLE: HF 4/5, KE 4+/5, ADF/PF 1+/5.  +Intention tremors. Skin: Skin is warmand dry. Dry dressing on back incision, not examined today Psychiatric: She has a normal mood and affect. Her behavior is normal. Judgmentand thought contentnormal.   Assessment/Plan: 1. Functional deficits secondary to lumbar stenosis/L4-S1 radiculopathy  s/p lumbar decompression and fusion  which require 3+ hours per day of interdisciplinary therapy in a comprehensive inpatient rehab setting. Physiatrist is providing close team supervision and 24 hour management of active medical problems listed below. Physiatrist and rehab team continue to assess barriers to discharge/monitor patient progress toward functional and medical goals.  Function:  Bathing Bathing position   Position: Wheelchair/chair at sink  Bathing parts Body parts bathed by patient: Right arm, Left arm, Chest, Abdomen, Right upper leg, Left upper leg Body parts bathed by helper: Right lower leg, Left lower leg, Front perineal area, Buttocks, Back  Bathing assist        Upper Body Dressing/Undressing Upper body dressing   What is the patient wearing?: Pull over shirt/dress, Orthosis     Pull over shirt/dress - Perfomed by patient: Thread/unthread right sleeve, Thread/unthread left sleeve, Put head through opening, Pull shirt over trunk       Orthosis activity level: Performed by helper  Upper body assist        Lower Body Dressing/Undressing Lower body dressing   What is the patient wearing?: Underwear, Pants, Non-skid slipper socks, Shoes Underwear - Performed by patient: Thread/unthread right underwear leg, Thread/unthread left underwear leg Underwear - Performed by helper: Pull underwear up/down Pants- Performed by patient: Thread/unthread right pants leg, Thread/unthread left pants leg Pants- Performed by helper: Pull pants up/down Non-skid slipper socks- Performed by  patient: Don/doff right sock, Don/doff left sock Non-skid slipper socks- Performed by helper: Don/doff right sock, Don/doff left sock       Shoes - Performed by helper: Don/doff right shoe, Don/doff left shoe, Fasten right, Fasten left          Lower body assist        Toileting Toileting     Toileting steps completed by helper: Adjust clothing prior to toileting, Performs perineal  hygiene, Adjust clothing after toileting Toileting Assistive Devices: Grab bar or rail  Toileting assist Assist level: Touching or steadying assistance (Pt.75%)   Transfers Chair/bed transfer   Chair/bed transfer method: Stand pivot Chair/bed transfer assist level: Touching or steadying assistance (Pt > 75%) Chair/bed transfer assistive device: Medical sales representative     Max distance: 10 ft  Assist level: Touching or steadying assistance (Pt > 75%)   Wheelchair   Type: Manual Max wheelchair distance: 100 ft Assist Level: Supervision or verbal cues  Cognition Comprehension Comprehension assist level: Follows basic conversation/direction with no assist  Expression Expression assist level: Expresses basic needs/ideas: With no assist  Social Interaction Social Interaction assist level: Interacts appropriately with others with medication or extra time (anti-anxiety, antidepressant).  Problem Solving Problem solving assist level: Solves basic 90% of the time/requires cueing < 10% of the time  Memory Memory assist level: More than reasonable amount of time    Medical Problem List and Plan: 1. Left lower extremity weakness and functional deficitssecondary to lumbar stenosis/L4-S1 radiculopathy s/p lumbar decompression and fusion (with re-do)  Cont CIR  Left AFO/PRAFO 2. H/o PE/DVT Prophylaxis/Anticoagulation: Mechanical: Sequential compression devices, below knee Bilateral lower extremities  Vascular study with chronic DVT in femoral vein  Lovenox  3. Pain Management: Continue oxycodone with flexeril prn.   Gabapentin d/ced due to potential significant increase in tremors, restarted low dose, pt still not able to tolerate  Lyrica 50 TID started 7/27  Lidoderm patch ordered for left foot 4. Mood: LCSW to follow for evaluation and support.  5. Neuropsych: This patient iscapable of making decisions on herown behalf. 6. Skin/Wound Care: Monitor wound for healing.  7.  Fluids/Electrolytes/Nutrition: Monitor I/O.  8. HTN: Monitor BP bid. On Norvasc, Inderal and HCTZ  Relatively controlled 7/27  Monitor with increased mobility 9. Hemochromatosis: Stable. Has not had to be phlebotomized in 6 years.  10. Restless leg syndrome: On Mirapex 11. Urinary retention:   D/ced foley  Bladder training.   Bethanechol started 7/27  UA equivocal, UCS remains pending.  12. Constipation: Increased senna to bid. 13. Hypokalemia  K+ 3.2 on 7/25  Labs ordered for Monday  Supplemented  Cont to monitor 14. Hypoalbuminemia  Supplement initiated 7/25 15. ABLA   Hb 9.1 on 7/25  Labs ordered for Monday  Cont to monitor 16. Sleep disturbance  Home Temazepam restarted 7/27  LOS (Days) 3 A FACE TO FACE EVALUATION WAS PERFORMED  Ankit Lorie Phenix 11/21/2016 8:27 AM

## 2016-11-22 ENCOUNTER — Inpatient Hospital Stay (HOSPITAL_COMMUNITY): Payer: PPO | Admitting: Occupational Therapy

## 2016-11-22 ENCOUNTER — Inpatient Hospital Stay (HOSPITAL_COMMUNITY): Payer: PPO | Admitting: *Deleted

## 2016-11-22 DIAGNOSIS — N390 Urinary tract infection, site not specified: Secondary | ICD-10-CM | POA: Insufficient documentation

## 2016-11-22 DIAGNOSIS — B9620 Unspecified Escherichia coli [E. coli] as the cause of diseases classified elsewhere: Secondary | ICD-10-CM | POA: Insufficient documentation

## 2016-11-22 LAB — URINE CULTURE

## 2016-11-22 MED ORDER — NITROFURANTOIN MONOHYD MACRO 100 MG PO CAPS
100.0000 mg | ORAL_CAPSULE | Freq: Two times a day (BID) | ORAL | Status: AC
  Administered 2016-11-22 – 2016-11-28 (×14): 100 mg via ORAL
  Filled 2016-11-22 (×15): qty 1

## 2016-11-22 MED ORDER — NORTRIPTYLINE HCL 25 MG PO CAPS
25.0000 mg | ORAL_CAPSULE | Freq: Every day | ORAL | Status: DC
  Administered 2016-11-22 – 2016-11-23 (×2): 25 mg via ORAL
  Filled 2016-11-22 (×2): qty 1

## 2016-11-22 NOTE — Progress Notes (Signed)
Occupational Therapy Session Note  Patient Details  Name: [name] MRN: [id number] Date of Birth: May 26, 1940  Today's Date: 11/22/2016 OT Individual Time: 1001-1101 OT Individual Time Calculation (min): 60 min    Short Term Goals: Week 1:  OT Short Term Goal 1 (Week 1): Pt will peform LB bathing sit to stand with supervision. OT Short Term Goal 2 (Week 1): Pt will complete LB dressing sit to stand with min assist using AE PRN. OT Short Term Goal 3 (Week 1): Pt will complete toilet transfers with min assist and min instructional cueing for sequencing walker usage.   OT Short Term Goal 4 (Week 1): Pt will complete walk-in shower transfers with min assist using RW and shower seat.  Skilled Therapeutic Interventions/Progress Updates:    Pt completed transfer from wheelchair to shower with min assist using the RW and AFO on the left foot.  She undressed sitting on the shower seat with supervision for UB and mod assist for LB secondary to left AFO, shoe, and socks.  Bathing sit to stand with min assist, integrating LH sponge for washing lower legs and feet, secondary to back precautions.  Completed dressing sit to stand from edge of tub bench as well.  Supervision for donning UB clothing and orthosis with min assist using reacher to donn underpants and pants.  Max assist for donning socks left AFO, and left shoe secondary to decreased time.  She was able to step into the right shoe with already being tied, while sitting.  Pt transferred back to the EOB and then transitioned to supine, both with min assist.  Call button and phone in reach.  Spouse also present in room as well.    Therapy Documentation Precautions:  Precautions Precautions: Back, Fall Precaution Booklet Issued: No Precaution Comments: No twisting/bending, no lifting over 5 lbs, no driving for 2 weeks  Required Braces or Orthoses: Spinal Brace Spinal Brace: Lumbar corset, Applied in sitting position Other Brace/Splint: Left  AFO Restrictions Weight Bearing Restrictions: No  Pain: Pain Assessment Pain Assessment: Faces Faces Pain Scale: Hurts a little bit Pain Type: Surgical pain Pain Location: Back Pain Orientation: Lower Pain Onset: With Activity Pain Intervention(s): Repositioned;Emotional support ADL: See Function Navigator for Current Functional Status.   Therapy/Group: Individual Therapy  Valentine Kuechle OTR/L 11/22/2016, 12:27 PM

## 2016-11-22 NOTE — Progress Notes (Signed)
Physical Therapy Session Note  Patient Details  Name: [name] MRN: [id number] Date of Birth: Sep 14, 1940  Today's Date: 11/22/2016 PT Individual Time: 0920-0950 and 1300-1415 PT Individual Time Calculation (min): 30 min and 29min    Short Term Goals: Week 1:  PT Short Term Goal 1 (Week 1): Pt will transfer bed<>chair via stand pivot transfer w/ Min A  PT Short Term Goal 2 (Week 1): Pt will ambulate 25 ft w/ Min A w/ RW PT Short Term Goal 3 (Week 1): Pt will maintain dynamic standing balance w/ supervision and UE support for 30 seconds PT Short Term Goal 4 (Week 1): Pt will propel w/c 150 ft w/ supervision using bilateral UEs PT Short Term Goal 5 (Week 1): Pt will negotiate 4 steps w/ Min A w/ bilateral UE support Week 2:     Skilled Therapeutic Interventions/Progress Updates:  First tx focused on functional mobiltiy training, safety, and gait with RW. Pt in bed, ready to go, but planning to get up before donning brace or shoes/AFO. Reviewed precautions and safety.   Bed mobility cues for technique and safety.  Brace donned EOB with Min A to find handles, discussed use.  Pt sat EOB with distant S x6 min for functional tasks.  Sti<>stands with Mod A for proper technique to avoid bending.  Gait in hall x80' with min A overall and multi-modal cues for LLE ext in stance and stance width. Pt with minimal awareness of knees slowly melting in to flexion.  Pt left up in [facility] with next therapy.  Second tx focused on therex for functional strengthening for fall risk reduction and carry-over in gait. PTtup in bed with spouse present. Pt educated on importance of OOB mobiltiy during the day, including posture and alignment, proper seating, etc. Beed mobility with S cues for safety and sequence.  Pt donned brace with S only EOB. Sit<>stands x10 throughout with cues for technique, back precautions, and stance width.  Gait 2x75' with RW and min A with tactile and verbal cues for L stance ext and  motor control.  Pt issued HEP with handout for [place] fall risk reduction with demo and cues for technique (did not perform heel/toe raises due to AFO, but 2x10 squats with manual facilitation for technique/form)  Stair training x8 with up to Mod A to steady and cues for sequence and LLE ext in stance.  Pt feeling good and left up in [facility] with spouse present. Extra basic back support added to WC.      Therapy Documentation Precautions:  Precautions Precautions: Back, Fall Precaution Booklet Issued: No Precaution Comments: No twisting/bending, no lifting over 5 lbs, no driving for 2 weeks  Required Braces or Orthoses: Spinal Brace Spinal Brace: Lumbar corset, Applied in sitting position Other Brace/Splint: Left AFO Restrictions Weight Bearing Restrictions: No   Pain: Pain Assessment Pain Score: 4    See Function Navigator for Current Functional Status.   Therapy/Group: Individual Therapy  Lakitha Gordy, Corinna Lines, PT, DPT  11/22/2016, 9:51 AM

## 2016-11-22 NOTE — Progress Notes (Signed)
[place] PHYSICAL MEDICINE & REHABILITATION     PROGRESS NOTE  Subjective/Complaints:  Pt seen laying in bed this AM.  She did not sleep well because of pain and jerking overnight.   ROS: +Spasms and LLE pain. Denies CP, SOB, N/V/D.  Objective: Vital Signs: Blood pressure (!) 119/51, pulse 64, temperature 98 F (36.7 C), temperature source Oral, resp. rate 18, height 5\' 2"  (1.575 m), weight 83.5 kg (184 lb), SpO2 98 %. No results found. No results for input(s): WBC, HGB, HCT, PLT in the last 72 hours. No results for input(s): NA, K, CL, GLUCOSE, BUN, CREATININE, CALCIUM in the last 72 hours.  Invalid input(s): CO CBG (last 3)  No results for input(s): GLUCAP in the last 72 hours.  Wt Readings from Last 3 Encounters:  11/21/16 83.5 kg (184 lb)  11/13/16 90.7 kg (200 lb)  11/03/16 82.9 kg (182 lb 12.2 oz)    Physical Exam:  BP (!) 119/51 (BP Location: Left Arm)   Pulse 64   Temp 98 F (36.7 C) (Oral)   Resp 18   Ht 5\' 2"  (1.575 m)   Wt 83.5 kg (184 lb)   SpO2 98%   BMI 33.65 kg/m  Constitutional: She appears well-developedand well-nourished.  HENT: Normocephalicand atraumatic.  Eyes: EOMI. No discharge. Cardiovascular: RRR. No JVD. Respiratory: Effort normal and breath sounds normal. GI: Soft. Bowel sounds are normal.   Musculoskeletal: She exhibits no edemaor tenderness.  Neurological: She is alertand oriented  Speech clear.  Motor: B/l UE 5/5.  RLE: 4/5 HF, 4+/5 KE, 5/5 ADF/PF LLE: HF 4/5, KE 4+/5, ADF/PF 1+/5 (stable).  +Intention tremors. Skin: Skin is warmand dry. Dry dressing on back incision, not examined today Psychiatric: She has a normal mood and affect. Her behavior is normal. Judgmentand thought contentnormal.   Assessment/Plan: 1. Functional deficits secondary to lumbar stenosis/L4-S1 radiculopathy s/p lumbar decompression and fusion  which require 3+ hours per day of interdisciplinary therapy in a comprehensive inpatient rehab  setting. Physiatrist is providing close team supervision and 24 hour management of active medical problems listed below. Physiatrist and rehab team continue to assess barriers to discharge/monitor patient progress toward functional and medical goals.  Function:  Bathing Bathing position   Position: Wheelchair/chair at sink  Bathing parts Body parts bathed by patient: Right arm, Left arm, Chest, Abdomen, Right upper leg, Left upper leg, Right lower leg, Left lower leg, Buttocks, Front perineal area Body parts bathed by helper: Back  Bathing assist Assist Level: Touching or steadying assistance(Pt > 75%)      Upper Body Dressing/Undressing Upper body dressing   What is the patient wearing?: Pull over shirt/dress, Orthosis     Pull over shirt/dress - Perfomed by patient: Thread/unthread right sleeve, Thread/unthread left sleeve, Put head through opening, Pull shirt over trunk       Orthosis activity level: Performed by patient  Upper body assist Assist Level: Touching or steadying assistance(Pt > 75%)      Lower Body Dressing/Undressing Lower body dressing   What is the patient wearing?: Underwear, Pants, Shoes, AFO, Socks Underwear - Performed by patient: Thread/unthread right underwear leg, Thread/unthread left underwear leg, Pull underwear up/down Underwear - Performed by helper: Pull underwear up/down Pants- Performed by patient: Thread/unthread right pants leg, Thread/unthread left pants leg, Pull pants up/down Pants- Performed by helper: Pull pants up/down Non-skid slipper socks- Performed by patient: Don/doff right sock, Don/doff left sock Non-skid slipper socks- Performed by helper: Don/doff right sock, Don/doff left sock  Socks - Performed by helper: Don/doff right sock, Don/doff left sock   Shoes - Performed by helper: Don/doff right shoe, Don/doff left shoe, Fasten right, Fasten left   AFO - Performed by helper: Don/doff right AFO, Don/doff left AFO      Lower  body assist Assist for lower body dressing: Touching or steadying assistance (Pt > 75%)      Toileting Toileting   Toileting steps completed by patient: Adjust clothing prior to toileting, Performs perineal hygiene, Adjust clothing after toileting Toileting steps completed by helper: Adjust clothing prior to toileting, Performs perineal hygiene, Adjust clothing after toileting Toileting Assistive Devices: Grab bar or rail  Toileting assist Assist level: Touching or steadying assistance (Pt.75%)   Transfers Chair/bed transfer   Chair/bed transfer method: Stand pivot Chair/bed transfer assist level: Touching or steadying assistance (Pt > 75%) Chair/bed transfer assistive device: Medical sales representative     Max distance: 55 ft Assist level: Touching or steadying assistance (Pt > 75%)   Wheelchair   Type: Manual Max wheelchair distance: 100 ft Assist Level: Supervision or verbal cues  Cognition Comprehension Comprehension assist level: Follows basic conversation/direction with no assist  Expression Expression assist level: Expresses basic needs/ideas: With no assist  Social Interaction Social Interaction assist level: Interacts appropriately with others with medication or extra time (anti-anxiety, antidepressant).  Problem Solving Problem solving assist level: Solves basic 90% of the time/requires cueing < 10% of the time  Memory Memory assist level: Recognizes or recalls 75 - 89% of the time/requires cueing 10 - 24% of the time    Medical Problem List and Plan: 1. Left lower extremity weakness and functional deficitssecondary to lumbar stenosis/L4-S1 radiculopathy s/p lumbar decompression and fusion (with re-do)  Cont CIR  Left AFO/PRAFO 2. H/o PE/DVT Prophylaxis/Anticoagulation: Mechanical: Sequential compression devices, below knee Bilateral lower extremities  Vascular study with chronic DVT in femoral vein  Lovenox  3. Pain Management: Continue oxycodone with  flexeril prn.   Gabapentin d/ced due to potential significant increase in tremors, restarted low dose, pt still not able to tolerate  Lyrica 50 TID started 7/27 with ?same side effects, will d/c  Lidodermpatch ordered for left foot  Will order Pamelor 4. Mood: LCSW to follow for evaluation and support.  5. Neuropsych: This patient iscapable of making decisions on herown behalf. 6. Skin/Wound Care: Monitor wound for healing.  7. Fluids/Electrolytes/Nutrition: Monitor I/O.  8. HTN: Monitor BP bid. On Norvasc, Inderal and HCTZ  Relatively controlled 7/28  Monitor with increased mobility 9. Hemochromatosis: Stable. Has not had to be phlebotomized in 6 years.  10. Restless leg syndrome: On Mirapex 11. Urinary retention:   D/ced foley  Bladder training.   Bethanechol started 7/27 12. Constipation: Increased senna to bid. 13. Hypokalemia  K+ 3.2 on 7/25  Labs ordered for Monday  Supplemented  Cont to monitor 14. Hypoalbuminemia  Supplement initiated 7/25 15. ABLA   Hb 9.1 on 7/25  Labs ordered for Monday  Cont to monitor 16. Sleep disturbance  Home Temazepam restarted 7/27 17. Acute lower UTI  Macrobid 7/28-7/8/2  LOS (Days) 4 A FACE TO FACE EVALUATION WAS PERFORMED  Ankit Lorie Phenix 11/22/2016 8:47 AM

## 2016-11-22 NOTE — Progress Notes (Signed)
Occupational Therapy Session Note  Patient Details  Name: [name] MRN: [id number] Date of Birth: 1940-06-25  Today's Date: 11/22/2016 OT Individual Time: 0626-9485 OT Individual Time Calculation (min): 31 min    Short Term Goals: Week 1:  OT Short Term Goal 1 (Week 1): Pt will peform LB bathing sit to stand with supervision. OT Short Term Goal 2 (Week 1): Pt will complete LB dressing sit to stand with min assist using AE PRN. OT Short Term Goal 3 (Week 1): Pt will complete toilet transfers with min assist and min instructional cueing for sequencing walker usage.   OT Short Term Goal 4 (Week 1): Pt will complete walk-in shower transfers with min assist using RW and shower seat.  Skilled Therapeutic Interventions/Progress Updates:    Pt completed simulated walk-in shower transfers with use of the RW and 3:1 during session.  Min assist needed for stepping over the simulated shower edge both forward and backwards.  Educated husband on technique and need for 3:1 in the shower and removal of all throw rugs throughout the bathroom and other rooms.  Pt transferred from the wheelchair to the EOB at end of session and transitioned to supine with min assist as well.  Spouse present in room with pt in bed, call button in reach.    Therapy Documentation Precautions:  Precautions Precautions: Back, Fall Precaution Booklet Issued: No Precaution Comments: No twisting/bending, no lifting over 5 lbs, no driving for 2 weeks  Required Braces or Orthoses: Spinal Brace Spinal Brace: Lumbar corset, Applied in sitting position Other Brace/Splint: Left AFO Restrictions Weight Bearing Restrictions: No  Pain: Pain Assessment Pain Assessment: Faces Faces Pain Scale: Hurts a little bit Pain Type: Surgical pain Pain Location: Back Pain Orientation: Lower Pain Descriptors / Indicators: Discomfort Pain Onset: With Activity Pain Intervention(s): Medication (See eMAR);Repositioned ADL: See Function  Navigator for Current Functional Status.   Therapy/Group: Individual Therapy  Braddock Servellon OTR/L 11/22/2016, 4:26 PM

## 2016-11-23 MED ORDER — BETHANECHOL CHLORIDE 10 MG PO TABS
10.0000 mg | ORAL_TABLET | Freq: Three times a day (TID) | ORAL | Status: DC
  Administered 2016-11-23 (×2): 10 mg via ORAL
  Filled 2016-11-23 (×2): qty 1

## 2016-11-23 NOTE — Plan of Care (Signed)
Problem: SCI BLADDER ELIMINATION Goal: RH STG MANAGE BLADDER WITH ASSISTANCE STG Manage Bladder With min  Assistance   Outcome: Not Progressing Inability to urinate, requiring straight caths for no void

## 2016-11-23 NOTE — Progress Notes (Signed)
[name] PHYSICAL MEDICINE & REHABILITATION     PROGRESS NOTE  Subjective/Complaints:  Pt states she did not sleep well because she thought something was wrong with her.  She appears to feel better this AM, but notes persistent jerking.   ROS: +Spasms and LLE pain. Denies CP, SOB, N/V/D.  Objective: Vital Signs: Blood pressure (!) 146/73, pulse 71, temperature 97.7 F (36.5 C), temperature source Oral, resp. rate 18, height 5\' 2"  (1.575 m), weight 84.2 kg (185 lb 11.2 oz), SpO2 98 %. No results found. No results for input(s): WBC, HGB, HCT, PLT in the last 72 hours. No results for input(s): NA, K, CL, GLUCOSE, BUN, CREATININE, CALCIUM in the last 72 hours.  Invalid input(s): CO CBG (last 3)  No results for input(s): GLUCAP in the last 72 hours.  Wt Readings from Last 3 Encounters:  11/22/16 84.2 kg (185 lb 11.2 oz)  11/13/16 90.7 kg (200 lb)  11/03/16 82.9 kg (182 lb 12.2 oz)    Physical Exam:  BP (!) 146/73 (BP Location: Right Arm)   Pulse 71   Temp 97.7 F (36.5 C) (Oral)   Resp 18   Ht 5\' 2"  (1.575 m)   Wt 84.2 kg (185 lb 11.2 oz)   SpO2 98%   BMI 33.96 kg/m  Constitutional: She appears well-developedand well-nourished.  HENT: Normocephalicand atraumatic.  Eyes: EOMI. No discharge. Cardiovascular: RRR. No JVD. Respiratory: Effort normal and breath sounds normal. GI: Soft. Bowel sounds are normal.   Musculoskeletal: She exhibits no edemaor tenderness.  Neurological: She is alertand oriented  Speech clear.  Motor: B/l UE 5/5.  RLE: 4/5 HF, 4+/5 KE, 5/5 ADF/PF LLE: HF 4/5, KE 4+/5, ADF/PF 1+/5 (stable).  Myoclonic jerks.  Skin: Skin is warmand dry. Dry dressing on back incision, not examined today Psychiatric: She has a normal mood and affect. Her behavior is normal. Judgmentand thought contentnormal.   Assessment/Plan: 1. Functional deficits secondary to lumbar stenosis/L4-S1 radiculopathy s/p lumbar decompression and fusion  which require 3+ hours per  day of interdisciplinary therapy in a comprehensive inpatient rehab setting. Physiatrist is providing close team supervision and 24 hour management of active medical problems listed below. Physiatrist and rehab team continue to assess barriers to discharge/monitor patient progress toward functional and medical goals.  Function:  Bathing Bathing position   Position: Shower  Bathing parts Body parts bathed by patient: Right arm, Left arm, Chest, Abdomen, Right upper leg, Left upper leg, Right lower leg, Left lower leg, Buttocks, Front perineal area Body parts bathed by helper: Back  Bathing assist Assist Level: Touching or steadying assistance(Pt > 75%)      Upper Body Dressing/Undressing Upper body dressing   What is the patient wearing?: Pull over shirt/dress, Orthosis, Bra Bra - Perfomed by patient: Thread/unthread right bra strap, Thread/unthread left bra strap, Hook/unhook bra (pull down sports bra)   Pull over shirt/dress - Perfomed by patient: Thread/unthread right sleeve, Thread/unthread left sleeve, Put head through opening, Pull shirt over trunk       Orthosis activity level: Performed by patient  Upper body assist Assist Level: Supervision or verbal cues      Lower Body Dressing/Undressing Lower body dressing   What is the patient wearing?: Underwear, Pants, Shoes, AFO, Socks Underwear - Performed by patient: Thread/unthread right underwear leg, Thread/unthread left underwear leg, Pull underwear up/down Underwear - Performed by helper: Pull underwear up/down Pants- Performed by patient: Thread/unthread right pants leg, Thread/unthread left pants leg, Pull pants up/down Pants- Performed by helper:  Pull pants up/down Non-skid slipper socks- Performed by patient: Don/doff right sock, Don/doff left sock Non-skid slipper socks- Performed by helper: Don/doff right sock, Don/doff left sock   Socks - Performed by helper: Don/doff right sock, Don/doff left sock Shoes - Performed  by patient: Don/doff right shoe Shoes - Performed by helper: Don/doff left shoe   AFO - Performed by helper: Don/doff left AFO      Lower body assist Assist for lower body dressing: Touching or steadying assistance (Pt > 75%)      Toileting Toileting   Toileting steps completed by patient: Adjust clothing prior to toileting, Performs perineal hygiene, Adjust clothing after toileting Toileting steps completed by helper: Adjust clothing prior to toileting, Performs perineal hygiene, Adjust clothing after toileting Toileting Assistive Devices: Grab bar or rail  Toileting assist Assist level: Touching or steadying assistance (Pt.75%)   Transfers Chair/bed transfer   Chair/bed transfer method: Stand pivot Chair/bed transfer assist level: Touching or steadying assistance (Pt > 75%) Chair/bed transfer assistive device: Medical sales representative     Max distance: 80 Assist level: Touching or steadying assistance (Pt > 75%)   Wheelchair   Type: Manual Max wheelchair distance: 100 ft Assist Level: Supervision or verbal cues  Cognition Comprehension Comprehension assist level: (P) Follows basic conversation/direction with no assist  Expression Expression assist level: (P) Expresses basic needs/ideas: With no assist  Social Interaction Social Interaction assist level: (P) Interacts appropriately with others with medication or extra time (anti-anxiety, antidepressant).  Problem Solving Problem solving assist level: (P) Solves basic 90% of the time/requires cueing < 10% of the time  Memory Memory assist level: (P) Recognizes or recalls 75 - 89% of the time/requires cueing 10 - 24% of the time    Medical Problem List and Plan: 1. Left lower extremity weakness and functional deficitssecondary to lumbar stenosis/L4-S1 radiculopathy s/p lumbar decompression and fusion (with re-do)  Cont CIR  Left AFO/PRAFO 2. H/o PE/DVT Prophylaxis/Anticoagulation: Mechanical: Sequential  compression devices, below knee Bilateral lower extremities  Vascular study with chronic DVT in femoral vein  Lovenox  3. Pain Management: Continue oxycodone with flexeril prn.   Gabapentin d/ced due to potential significant increase in tremors, restarted low dose, pt still not able to tolerate, d/ced  Lyrica 50 TID started 7/27 with ?same side effects, d/ced  Lidodermpatch ordered for left foot  Pamelor started 7/28 4. Mood: LCSW to follow for evaluation and support.  5. Neuropsych: This patient iscapable of making decisions on herown behalf. 6. Skin/Wound Care: Monitor wound for healing.  7. Fluids/Electrolytes/Nutrition: Monitor I/O.  8. HTN: Monitor BP bid. On Norvasc, Inderal and HCTZ  Relatively controlled 7/29  Monitor with increased mobility 9. Hemochromatosis: Stable. Has not had to be phlebotomized in 6 years.  10. Restless leg syndrome: On Mirapex 11. Urinary retention:   D/ced foley  Bladder training.   Bethanechol started 7/27, increased on 7/29  Cont to monitor 12. Constipation: Increased senna to bid. 13. Hypokalemia  K+ 3.2 on 7/25  Labs ordered for Monday  Supplemented  Cont to monitor 14. Hypoalbuminemia  Supplement initiated 7/25 15. ABLA   Hb 9.1 on 7/25  Labs ordered for Monday  Cont to monitor 16. Sleep disturbance  Home Temazepam restarted 7/27 17. Acute lower UTI  Macrobid 7/28-7/8/2  LOS (Days) 5 A FACE TO FACE EVALUATION WAS PERFORMED  Ankit Lorie Phenix 11/23/2016 7:50 AM

## 2016-11-24 ENCOUNTER — Inpatient Hospital Stay (HOSPITAL_COMMUNITY): Payer: PPO

## 2016-11-24 ENCOUNTER — Inpatient Hospital Stay (HOSPITAL_COMMUNITY): Payer: PPO | Admitting: Occupational Therapy

## 2016-11-24 ENCOUNTER — Encounter (HOSPITAL_COMMUNITY): Payer: Self-pay

## 2016-11-24 LAB — BASIC METABOLIC PANEL
Anion gap: 9 (ref 5–15)
BUN: 14 mg/dL (ref 6–20)
CHLORIDE: 97 mmol/L — AB (ref 101–111)
CO2: 29 mmol/L (ref 22–32)
Calcium: 9 mg/dL (ref 8.9–10.3)
Creatinine, Ser: 1 mg/dL (ref 0.44–1.00)
GFR calc Af Amer: >60 mL/min (ref >60)
GFR calc non Af Amer: 53 mL/min — ABNORMAL LOW (ref >60)
GLUCOSE: 105 mg/dL — AB (ref 65–99)
Potassium: 3.5 mmol/L (ref 3.5–5.1)
Sodium: 135 mmol/L (ref 135–145)

## 2016-11-24 LAB — CBC WITH DIFFERENTIAL/PLATELET
Basophils Absolute: 0 10*3/uL (ref 0.0–0.1)
Basophils Relative: 1 %
EOS PCT: 9 %
Eosinophils Absolute: 0.6 10*3/uL (ref 0.0–0.7)
HCT: 29.4 % — ABNORMAL LOW (ref 36.0–46.0)
HEMOGLOBIN: 9.3 g/dL — AB (ref 12.0–15.0)
LYMPHS ABS: 1.5 10*3/uL (ref 0.7–4.0)
LYMPHS PCT: 23 %
MCH: 26.1 pg (ref 26.0–34.0)
MCHC: 31.6 g/dL (ref 30.0–36.0)
MCV: 82.6 fL (ref 78.0–100.0)
Monocytes Absolute: 0.6 10*3/uL (ref 0.1–1.0)
Monocytes Relative: 9 %
Neutro Abs: 4 10*3/uL (ref 1.7–7.7)
Neutrophils Relative %: 58 %
PLATELETS: 305 10*3/uL (ref 150–400)
RBC: 3.56 MIL/uL — AB (ref 3.87–5.11)
RDW: 15.6 % — ABNORMAL HIGH (ref 11.5–15.5)
WBC: 6.8 10*3/uL (ref 4.0–10.5)

## 2016-11-24 MED ORDER — PROCHLORPERAZINE MALEATE 5 MG PO TABS: 5.0000 mg | ORAL_TABLET | Freq: Four times a day (QID) | ORAL | Status: DC | PRN

## 2016-11-24 MED ORDER — GUAIFENESIN-DM 100-10 MG/5ML PO SYRP: 10.0000 mL | ORAL_SOLUTION | Freq: Four times a day (QID) | ORAL | Status: DC | PRN

## 2016-11-24 MED ORDER — ACETAMINOPHEN 325 MG PO TABS
650.0000 mg | ORAL_TABLET | ORAL | Status: DC | PRN
  Administered 2016-11-26: 650 mg via ORAL
  Filled 2016-11-24: qty 2

## 2016-11-24 MED ORDER — OXYCODONE HCL 5 MG PO TABS: 10.0000 mg | ORAL_TABLET | ORAL | Status: DC | PRN

## 2016-11-24 MED ORDER — DIPHENHYDRAMINE HCL 12.5 MG/5ML PO ELIX
12.5000 mg | ORAL_SOLUTION | Freq: Four times a day (QID) | ORAL | Status: DC | PRN
  Filled 2016-11-24: qty 10

## 2016-11-24 MED ORDER — BETHANECHOL CHLORIDE 25 MG PO TABS
25.0000 mg | ORAL_TABLET | Freq: Three times a day (TID) | ORAL | Status: DC
  Administered 2016-11-24 (×2): 25 mg via ORAL
  Filled 2016-11-24 (×2): qty 1

## 2016-11-24 MED ORDER — NORTRIPTYLINE HCL 25 MG PO CAPS
25.0000 mg | ORAL_CAPSULE | Freq: Two times a day (BID) | ORAL | Status: DC
  Administered 2016-11-24 – 2016-11-29 (×12): 25 mg via ORAL
  Filled 2016-11-24 (×13): qty 1

## 2016-11-24 MED ORDER — PROCHLORPERAZINE EDISYLATE 5 MG/ML IJ SOLN: 5.0000 mg | Freq: Four times a day (QID) | INTRAMUSCULAR | Status: DC | PRN

## 2016-11-24 MED ORDER — PROCHLORPERAZINE 25 MG RE SUPP: 12.5000 mg | Freq: Four times a day (QID) | RECTAL | Status: DC | PRN

## 2016-11-24 MED ORDER — OXYCODONE HCL 5 MG PO TABS
10.0000 mg | ORAL_TABLET | ORAL | Status: DC | PRN
  Administered 2016-11-24 – 2016-11-30 (×13): 10 mg via ORAL
  Filled 2016-11-24 (×13): qty 2

## 2016-11-24 MED ORDER — OXYCODONE HCL 5 MG PO TABS
5.0000 mg | ORAL_TABLET | ORAL | Status: DC | PRN
  Administered 2016-11-24 – 2016-11-26 (×5): 5 mg via ORAL
  Filled 2016-11-24 (×5): qty 1

## 2016-11-24 NOTE — Progress Notes (Signed)
[facility]     PROGRESS NOTE  Subjective/Complaints:  Pt seen sitting up in bed this AM.  She slept well overnight.  She notes persistent pain in her LLE.   ROS: +Spasms and LLE pain. Denies CP, SOB, N/V/D.  Objective: Vital Signs: Blood pressure 126/65, pulse 66, temperature 97.8 F (36.6 C), temperature source Oral, resp. rate 18, height 5\' 2"  (1.575 m), weight 84.2 kg (185 lb 11.2 oz), SpO2 96 %. No results found. No results for input(s): WBC, HGB, HCT, PLT in the last 72 hours. No results for input(s): NA, K, CL, GLUCOSE, BUN, CREATININE, CALCIUM in the last 72 hours.  Invalid input(s): CO CBG (last 3)  No results for input(s): GLUCAP in the last 72 hours.  Wt Readings from Last 3 Encounters:  11/22/16 84.2 kg (185 lb 11.2 oz)  11/13/16 90.7 kg (200 lb)  11/03/16 82.9 kg (182 lb 12.2 oz)    Physical Exam:  BP 126/65 (BP Location: Right Arm)   Pulse 66   Temp 97.8 F (36.6 C) (Oral)   Resp 18   Ht 5\' 2"  (1.575 m)   Wt 84.2 kg (185 lb 11.2 oz)   SpO2 96%   BMI 33.96 kg/m  Constitutional: She appears well-developedand well-nourished.  HENT: Normocephalicand atraumatic.  Eyes: EOMI. No discharge. Cardiovascular: RRR. No JVD. Respiratory: Effort normal and breath sounds normal. GI: Soft. Bowel sounds are normal.   Musculoskeletal: She exhibits no edemaor tenderness.  Neurological: She is alertand oriented  Speech clear.  Motor: B/l UE 5/5.  RLE: 4/5 HF, 4+/5 KE, 5/5 ADF/PF LLE: HF 4/5, KE 4+/5, ADF/PF 1+/5 (unchanged).  Myoclonic jerks.  Skin: Skin is warmand dry. Incision c/d/i Psychiatric: She has a normal mood and affect. Her behavior is normal. Judgmentand thought contentnormal.   Assessment/Plan: 1. Functional deficits secondary to lumbar stenosis/L4-S1 radiculopathy s/p lumbar decompression and fusion  which require 3+ hours per day of interdisciplinary therapy in a comprehensive inpatient rehab setting. Physiatrist  is providing close team supervision and 24 hour management of active medical problems listed below. Physiatrist and rehab team continue to assess barriers to discharge/monitor patient progress toward functional and medical goals.  Function:  Bathing Bathing position   Position: Shower  Bathing parts Body parts bathed by patient: Right arm, Left arm, Chest, Abdomen, Right upper leg, Left upper leg, Right lower leg, Left lower leg, Buttocks, Front perineal area Body parts bathed by helper: Back  Bathing assist Assist Level: Touching or steadying assistance(Pt > 75%)      Upper Body Dressing/Undressing Upper body dressing   What is the patient wearing?: Pull over shirt/dress, Orthosis, Bra Bra - Perfomed by patient: Thread/unthread right bra strap, Thread/unthread left bra strap, Hook/unhook bra (pull down sports bra)   Pull over shirt/dress - Perfomed by patient: Thread/unthread right sleeve, Thread/unthread left sleeve, Put head through opening, Pull shirt over trunk       Orthosis activity level: Performed by patient  Upper body assist Assist Level: Supervision or verbal cues      Lower Body Dressing/Undressing Lower body dressing   What is the patient wearing?: Underwear, Pants, Shoes, AFO, Socks Underwear - Performed by patient: Thread/unthread right underwear leg, Thread/unthread left underwear leg, Pull underwear up/down Underwear - Performed by helper: Pull underwear up/down Pants- Performed by patient: Thread/unthread right pants leg, Thread/unthread left pants leg, Pull pants up/down Pants- Performed by helper: Pull pants up/down Non-skid slipper socks- Performed by patient: Don/doff right sock, Don/doff left  sock Non-skid slipper socks- Performed by helper: Don/doff right sock, Don/doff left sock   Socks - Performed by helper: Don/doff right sock, Don/doff left sock Shoes - Performed by patient: Don/doff right shoe Shoes - Performed by helper: Don/doff left shoe   AFO  - Performed by helper: Don/doff left AFO      Lower body assist Assist for lower body dressing: Touching or steadying assistance (Pt > 75%)      Toileting Toileting   Toileting steps completed by patient: Adjust clothing prior to toileting, Performs perineal hygiene, Adjust clothing after toileting Toileting steps completed by helper: Adjust clothing prior to toileting, Performs perineal hygiene, Adjust clothing after toileting Toileting Assistive Devices: Grab bar or rail  Toileting assist Assist level: Touching or steadying assistance (Pt.75%)   Transfers Chair/bed transfer   Chair/bed transfer method: Stand pivot Chair/bed transfer assist level: Touching or steadying assistance (Pt > 75%) Chair/bed transfer assistive device: Medical sales representative     Max distance: 80 Assist level: Touching or steadying assistance (Pt > 75%)   Wheelchair   Type: Manual Max wheelchair distance: 100 ft Assist Level: Supervision or verbal cues  Cognition Comprehension Comprehension assist level: Follows basic conversation/direction with no assist  Expression Expression assist level: Expresses basic needs/ideas: With no assist  Social Interaction Social Interaction assist level: Interacts appropriately with others with medication or extra time (anti-anxiety, antidepressant).  Problem Solving Problem solving assist level: Solves basic 90% of the time/requires cueing < 10% of the time  Memory Memory assist level: Recognizes or recalls 75 - 89% of the time/requires cueing 10 - 24% of the time    Medical Problem List and Plan: 1. Left lower extremity weakness and functional deficitssecondary to lumbar stenosis/L4-S1 radiculopathy s/p lumbar decompression and fusion (with re-do)  Cont CIR  Left AFO/PRAFO 2. H/o PE/DVT Prophylaxis/Anticoagulation: Mechanical: Sequential compression devices, below knee Bilateral lower extremities  Vascular study with chronic DVT in femoral  vein  Lovenox  3. Pain Management: Continue oxycodone with flexeril prn.   Gabapentin d/ced due to potential significant increase in tremors, restarted low dose, pt still not able to tolerate, d/ced  Lyrica 50 TID started 7/27 with ?same side effects, d/ced  Lidodermpatch ordered for left foot  Pamelor started 7/28, increased 7/30 to BID 4. Mood: LCSW to follow for evaluation and support.  5. Neuropsych: This patient iscapable of making decisions on herown behalf. 6. Skin/Wound Care: Monitor wound for healing.  7. Fluids/Electrolytes/Nutrition: Monitor I/O.  8. HTN: Monitor BP bid. On Norvasc, Inderal and HCTZ  Relatively controlled 7/30  Monitor with increased mobility 9. Hemochromatosis: Stable. Has not had to be phlebotomized in 6 years.  10. Restless leg syndrome: On Mirapex 11. Urinary retention:   D/ced foley  Bladder training.   Bethanechol started 7/27, increased on 7/29, increased on 7/30  Cont to monitor 12. Constipation: Increased senna to bid. 13. Hypokalemia  K+ 3.2 on 7/25  Labs pending  Supplemented  Cont to monitor 14. Hypoalbuminemia  Supplement initiated 7/25 15. ABLA   Hb 9.1 on 7/25  Labs pending   Cont to monitor 16. Sleep disturbance  Home Temazepam restarted 7/27 17. Acute lower UTI  Macrobid 7/28-7/8/2  LOS (Days) 6 A FACE TO FACE EVALUATION WAS PERFORMED  Bodhi Moradi Lorie Phenix 11/24/2016 8:41 AM

## 2016-11-24 NOTE — Plan of Care (Signed)
Problem: SCI BOWEL ELIMINATION Goal: RH STG SCI MANAGE BOWEL WITH MEDICATION WITH ASSISTANCE STG SCI Manage bowel with medication with mod I assistance.   Outcome: Progressing Stated she had bowel movement today  Problem: SCI BLADDER ELIMINATION Goal: RH STG MANAGE BLADDER WITH ASSISTANCE STG Manage Bladder With min  Assistance   Outcome: Not Progressing Straight catheterized, patient did not feel the urge to urinate, bladder scanned for 680 ml. In and out cath. For 650 ml  Problem: RH SAFETY Goal: RH STG ADHERE TO SAFETY PRECAUTIONS W/ASSISTANCE/DEVICE STG Adhere to Safety Precautions With cues/supervision Assistance/Device.   Outcome: Progressing No safety issues noted  Problem: RH PAIN MANAGEMENT Goal: RH STG PAIN MANAGED AT OR BELOW PT'S PAIN GOAL Managed at or below 4  Outcome: Progressing Pain well managed with one Oxycodone 5mg 

## 2016-11-24 NOTE — Progress Notes (Addendum)
Occupational Therapy Session Note  Patient Details  Name: [name] MRN: [id number] Date of Birth: 03/18/1941  Today's Date: 11/24/2016 OT Individual Time: 9371-6967 OT Individual Time Calculation (min): 79 min    Short Term Goals: Week 1:  OT Short Term Goal 1 (Week 1): Pt will peform LB bathing sit to stand with supervision. OT Short Term Goal 2 (Week 1): Pt will complete LB dressing sit to stand with min assist using AE PRN. OT Short Term Goal 3 (Week 1): Pt will complete toilet transfers with min assist and min instructional cueing for sequencing walker usage.   OT Short Term Goal 4 (Week 1): Pt will complete walk-in shower transfers with min assist using RW and shower seat.  Skilled Therapeutic Interventions/Progress Updates:    Pt received sitting EOB, curling her hair, agreeable to OT tx session. Setup assist provided for Pt to don spinal brace prior to start of session. Focus of sesssion on ADL/self-care retraining, AE training and functional mobility transfers. Pt requires Mod verbal cues throughout session for adhering to back precautions, though is able to recall/verbalize 3/3 precautions. Pt completes sit<>stand at RW and ambulates approx 5' to w/c with MinA throughout. Pt completes stand pivot transfer w/c<>tub bench using RW and grab bars with minA. Completes doffing of clothing, shoes/AFO, and spinal brace while seated on tub bench with assist for doffing footwear, using reacher to doff pants/underwear. Pt Completed bathing from sit<>stand level in shower with MinA during standing and verbal cues throughout for safety. Completed drying off and UB dressing/donning of spinal brace while seated on tub bench prior to transfer to w/c for LB dressing. Pt utilized reacher for donning underwear/pants, with minA to steady while standing at RW to advance over hips. MaxA for using sock aide to don socks, and total assist for donning shoes/AFO due to time management. Ended session with Pt  seated in w/c, call bell and needs within reach.    Therapy Documentation Precautions:  Precautions Precautions: Back, Fall Precaution Booklet Issued: No Precaution Comments: No twisting/bending, no lifting over 5 lbs, no driving for 2 weeks  Required Braces or Orthoses: Spinal Brace Spinal Brace: Lumbar corset, Applied in sitting position Other Brace/Splint: Left AFO Restrictions Weight Bearing Restrictions: No  Pain: Pain Assessment Pain Assessment: Faces Pain Score: 8  Faces Pain Scale: No hurt Pain Type: Acute pain Pain Location: Back Pain Intervention(s): Medication (See eMAR)  See Function Navigator for Current Functional Status.   Therapy/Group: Individual Therapy  Raymondo Band 11/24/2016, 3:56 PM

## 2016-11-24 NOTE — Progress Notes (Signed)
Physical Therapy Session Note  Patient Details  Name: [name] MRN: [id number] Date of Birth: 12-24-1940  Today's Date: 11/24/2016 PT Individual Time: 1100-1200, 3419-3790 PT Individual Time Calculation (min): 60 min , 60 min  Short Term Goals: Week 1:  PT Short Term Goal 1 (Week 1): Pt will transfer bed<>chair via stand pivot transfer w/ Min A  PT Short Term Goal 2 (Week 1): Pt will ambulate 25 ft w/ Min A w/ RW PT Short Term Goal 3 (Week 1): Pt will maintain dynamic standing balance w/ supervision and UE support for 30 seconds PT Short Term Goal 4 (Week 1): Pt will propel w/c 150 ft w/ supervision using bilateral UEs PT Short Term Goal 5 (Week 1): Pt will negotiate 4 steps w/ Min A w/ bilateral UE support  Skilled Therapeutic Interventions/Progress Updates:    Session 1: Pt sitting in w/c upon PT arrival, agreeable to therapy tx. Pt propelled w/c x 150 ft using B UEs to the gym. Pt ambulated 2 x 70 ft with min assist, verbal cues to increase R step length and for step width with L LE. Pt used kinetron x 10 minutes working on standing balance and equal weightbearing through LEs without UE support.  Seated in w/c pt performed 2 x 10 hip abduction, assist to help abduct L LE. Pt performed 2 x 10 LAQ on each leg with 2lb weight for LE strengthening. Performed 1 x 5 sit to stands for strengthening with ball between knees to prevent adduction, verbal cues to maintain precautions. Pt transferred from w/c to recliner using RW and min assist. Pt left in recliner with call bell in reach.   Session 2: Pt supine in bed upon PT arrival, agreeable to therapy tx. Pt transferred from supine to sitting EOB with min assist. Pt ambulated x 85 ft with min assist using RW part way to the gym, limited by decreased endurance and weakness. As pt fatigues during gait, pt demonstrates increased trendelenberg and decreased R step length. Pt transferred from w/c to mat, stand step using RW and min assist. Pt  reclined in supine on mat performed 3 x 15 hip abduction with maxi slide to work on hip strengthening, pt performed 2 x 10 heel slides, and hip IR with leg straight 2 x 10.  Pt performed bridges 1 x 10 for LE strengthening. Pt transferred from mat to nustep via ambulation x 5 ft using RW and min assist. Pt used nustep x 8 minutes on workload 3 to work on endurance and LE strengthening. Pt c/o of being very fatigue this session, falling asleep at times during the session. Pt missed 15 min secondary to fatigue. Pt transferred from w/c to bed with min assist using RW. Pt left supine in bed with call bell in reach.   Therapy Documentation Precautions:  Precautions Precautions: Back, Fall Precaution Booklet Issued: No Precaution Comments: No twisting/bending, no lifting over 5 lbs, no driving for 2 weeks  Required Braces or Orthoses: Spinal Brace Spinal Brace: Lumbar corset, Applied in sitting position Other Brace/Splint: Left AFO Restrictions Weight Bearing Restrictions: No  General: Time missed this session: 15 min Secondary to pt fatigue   See Function Navigator for Current Functional Status.   Therapy/Group: Individual Therapy  [name], PT, DPT 11/24/2016, 11:35 AM

## 2016-11-25 ENCOUNTER — Inpatient Hospital Stay (HOSPITAL_COMMUNITY): Payer: PPO | Admitting: Occupational Therapy

## 2016-11-25 ENCOUNTER — Inpatient Hospital Stay (HOSPITAL_COMMUNITY): Payer: PPO

## 2016-11-25 LAB — CBC
HEMATOCRIT: 26.9 % — AB (ref 36.0–46.0)
Hemoglobin: 8.4 g/dL — ABNORMAL LOW (ref 12.0–15.0)
MCH: 25.7 pg — ABNORMAL LOW (ref 26.0–34.0)
MCHC: 31.2 g/dL (ref 30.0–36.0)
MCV: 82.3 fL (ref 78.0–100.0)
Platelets: 267 10*3/uL (ref 150–400)
RBC: 3.27 MIL/uL — ABNORMAL LOW (ref 3.87–5.11)
RDW: 15.6 % — AB (ref 11.5–15.5)
WBC: 5.5 10*3/uL (ref 4.0–10.5)

## 2016-11-25 LAB — BASIC METABOLIC PANEL
Anion gap: 8 (ref 5–15)
BUN: 12 mg/dL (ref 6–20)
CHLORIDE: 96 mmol/L — AB (ref 101–111)
CO2: 31 mmol/L (ref 22–32)
Calcium: 8.8 mg/dL — ABNORMAL LOW (ref 8.9–10.3)
Creatinine, Ser: 0.93 mg/dL (ref 0.44–1.00)
GFR calc Af Amer: >60 mL/min (ref >60)
GFR calc non Af Amer: 58 mL/min — ABNORMAL LOW (ref >60)
Glucose, Bld: 90 mg/dL (ref 65–99)
POTASSIUM: 3.6 mmol/L (ref 3.5–5.1)
SODIUM: 135 mmol/L (ref 135–145)

## 2016-11-25 MED ORDER — BETHANECHOL CHLORIDE 25 MG PO TABS
50.0000 mg | ORAL_TABLET | Freq: Three times a day (TID) | ORAL | Status: DC
  Administered 2016-11-25 – 2016-11-29 (×14): 50 mg via ORAL
  Administered 2016-11-30: 25 mg via ORAL
  Filled 2016-11-25 (×17): qty 2

## 2016-11-25 NOTE — Progress Notes (Signed)
Physical Therapy Weekly Progress Note  Patient Details  Name: [name] MRN: [id number] Date of Birth: November 09, 1940  Beginning of progress report period: November 19, 2016 End of progress report period: November 25, 2016  Today's Date: 11/25/2016 PT Individual Time: 1045-1200 PT Individual Time Calculation (min): 75 min   Patient has met 5 of 5 short term goals.  Pt is improving with LE strength, balance and functional mobility but remains limited by decreased endurance, dynamic balance without UE support and L LE weakness (specifically in hip abductors and hamstrings) affecting her ability to ambulate longer distances. Pt also has difficulty remembering and maintaining precautions during all functional activities.   Patient continues to demonstrate the following deficits muscle weakness, decreased cardiorespiratoy endurance, impaired timing and sequencing and decreased coordination and decreased standing balance, decreased balance strategies and difficulty maintaining precautions and therefore will continue to benefit from skilled PT intervention to increase functional independence with mobility.  Patient progressing toward long term goals..  Continue plan of care.  PT Short Term Goals Week 1:  PT Short Term Goal 1 (Week 1): Pt will transfer bed<>chair via stand pivot transfer w/ Min A  PT Short Term Goal 1 - Progress (Week 1): Met PT Short Term Goal 2 (Week 1): Pt will ambulate 25 ft w/ Min A w/ RW PT Short Term Goal 2 - Progress (Week 1): Met PT Short Term Goal 3 (Week 1): Pt will maintain dynamic standing balance w/ supervision and UE support for 30 seconds PT Short Term Goal 3 - Progress (Week 1): Met PT Short Term Goal 4 (Week 1): Pt will propel w/c 150 ft w/ supervision using bilateral UEs PT Short Term Goal 4 - Progress (Week 1): Met PT Short Term Goal 5 (Week 1): Pt will negotiate 4 steps w/ Min A w/ bilateral UE support PT Short Term Goal 5 - Progress (Week 1): Met Week 2:  PT Short  Term Goal 1 (Week 2): STG=LTG secondary to ELOS  Skilled Therapeutic Interventions/Progress Updates:    Pt propelled w/c 150 ft to the gym with supervision using B UEs. Pt transferred from sit to stand with min assist, and ascended/descended 4 steps using B handrails and min assist. Pt transferred from w/c to mat with RW and min assist. Pt transferred from sitting EOB to supine reclined on wedge with verbal cues to maintain precautions with log roll. Pt performed 2 x 10 hip abduction with maxislide using L LE, bridges using B LEs 2 x 10 and L hip abduction with knee bent against manual resistance 2 x 10 with 5 sec hold. Pt transferred from supine to sitting EOB with verbal cues and min assist to log roll. Pt ambulated x 75 ft using RW and min assist, with fatigue increased trendelenberg and decreased extension on L LE is evident. Pt standing on foam working on dynamic balance without UE support in order to match cards 2 x 3 minutes. Pt ambulated x 30 ft part way back to room with min assist using RW, limited by fatigue and weakness. Pt reported needing to use bathroom. Pt ambulated from w/c to bathroom x 10 ft using RW and min assist, standing without UE support in order to doff/don shorts. Pt ambulated from bathroom to bed with min assist using RW and transferred from sitting to supine with verbal cues to maintain precautions. Pt left supine in bed with needs in reach.   Therapy Documentation Precautions:  Precautions Precautions: Back, Fall Precaution Booklet Issued: No Precaution  Comments: No twisting/bending, no lifting over 5 lbs, no driving for 2 weeks  Required Braces or Orthoses: Spinal Brace Spinal Brace: Lumbar corset, Applied in sitting position Other Brace/Splint: Left AFO Restrictions Weight Bearing Restrictions: No    See Function Navigator for Current Functional Status.  Therapy/Group: Individual Therapy  [name], PT, DPT 11/25/2016, 11:23 AM

## 2016-11-25 NOTE — Progress Notes (Signed)
Occupational Therapy Session Note  Patient Details  Name: [name] MRN: [id number] Date of Birth: July 08, 1940  Today's Date: 11/25/2016 OT Individual Time: 5329-9242 OT Individual Time Calculation (min): 89 min    Short Term Goals: Week 1:  OT Short Term Goal 1 (Week 1): Pt will peform LB bathing sit to stand with supervision. OT Short Term Goal 2 (Week 1): Pt will complete LB dressing sit to stand with min assist using AE PRN. OT Short Term Goal 3 (Week 1): Pt will complete toilet transfers with min assist and min instructional cueing for sequencing walker usage.   OT Short Term Goal 4 (Week 1): Pt will complete walk-in shower transfers with min assist using RW and shower seat.  Skilled Therapeutic Interventions/Progress Updates:    Pt completed supine to sit EOB with min assist and mod instructional cueing to maintain back precautions.  Once sitting she was able to doff her gripper socks with use of the reacher and donn regular socks with use of the sockaide, both with supervision.  She then donned her right shoe with use of the reacher, with it tied, but needed max assist for the left shoe and AFO.  Once completed she transferred to the wheelchair where she was taken down to the therapy gym for session.  Had her work on sit to stand transitions from therapy mat while completing reaching activity with alternating UEs.  She continues to need mod instructional cueing to remember to push up from surfaces during transition as well as for reaching back to surface when attempting to sit down.  Min assist to completed several of these transitions.  Finished session by educating her on use of the RW in the kitchen for transporting items around using safety and energy conservation strategies.  She was able to use the walker and counter tops to successfully place items back in the appropriate locations.  Transitioned back to room at end of session with pt in wheelchair.  Transferred back to bed with min  assist for conclusion of session.  Call button and phone in reach.    Therapy Documentation Precautions:  Precautions Precautions: Back, Fall Precaution Booklet Issued: No Precaution Comments: No twisting/bending, no lifting over 5 lbs, no driving for 2 weeks  Required Braces or Orthoses: Spinal Brace Spinal Brace: Lumbar corset, Applied in sitting position Other Brace/Splint: Left AFO Restrictions Weight Bearing Restrictions: No  Pain: Pain Assessment Pain Assessment: Faces Faces Pain Scale: Hurts little more Pain Type: Acute pain Pain Location: Leg Pain Orientation: Left Pain Descriptors / Indicators: Aching;Discomfort Pain Onset: With Activity Pain Intervention(s): Medication (See eMAR);Repositioned ADL: See Function Navigator for Current Functional Status.   Therapy/Group: Individual Therapy  Lennyx Verdell OTR/L 11/25/2016, 4:12 PM

## 2016-11-25 NOTE — Progress Notes (Signed)
[facility]     PROGRESS NOTE  Subjective/Complaints:  Pt seen sitting up in bed eating breakfast this AM.  She slept well overnight.  She is upset her left foot is "not working".  She notes improvement in jerks and pain.    ROS: Denies CP, SOB, N/V/D.  Objective: Vital Signs: Blood pressure 133/62, pulse 69, temperature 98.6 F (37 C), temperature source Oral, resp. rate 18, height 5\' 2"  (1.575 m), weight 84.2 kg (185 lb 11.2 oz), SpO2 95 %. No results found.  Recent Labs  11/24/16 1058 11/25/16 0437  WBC 6.8 5.5  HGB 9.3* 8.4*  HCT 29.4* 26.9*  PLT 305 267    Recent Labs  11/24/16 1058 11/25/16 0437  NA 135 135  K 3.5 3.6  CL 97* 96*  GLUCOSE 105* 90  BUN 14 12  CREATININE 1.00 0.93  CALCIUM 9.0 8.8*   CBG (last 3)  No results for input(s): GLUCAP in the last 72 hours.  Wt Readings from Last 3 Encounters:  11/22/16 84.2 kg (185 lb 11.2 oz)  11/13/16 90.7 kg (200 lb)  11/03/16 82.9 kg (182 lb 12.2 oz)    Physical Exam:  BP 133/62 (BP Location: Right Arm)   Pulse 69   Temp 98.6 F (37 C) (Oral)   Resp 18   Ht 5\' 2"  (1.575 m)   Wt 84.2 kg (185 lb 11.2 oz)   SpO2 95%   BMI 33.96 kg/m  Constitutional: She appears well-developedand well-nourished.  HENT: Normocephalicand atraumatic.  Eyes: EOMI. No discharge. Cardiovascular: RRR. No JVD. Respiratory: Effort normal and breath sounds normal. GI: Soft. Bowel sounds are normal.   Musculoskeletal: She exhibits no edemaor tenderness.  Neurological: She is alertand oriented  Speech clear.  Motor: B/l UE 5/5.  RLE: 4/5 HF, 4+/5 KE, 5/5 ADF/PF LLE: HF 4/5, KE 4+/5, ADF/PF 1+/5 (stable).  Skin: Skin is warmand dry. Incision c/d/i Psychiatric: She has a normal mood and affect. Her behavior is normal. Judgmentand thought contentnormal.   Assessment/Plan: 1. Functional deficits secondary to lumbar stenosis/L4-S1 radiculopathy s/p lumbar decompression and fusion  which  require 3+ hours per day of interdisciplinary therapy in a comprehensive inpatient rehab setting. Physiatrist is providing close team supervision and 24 hour management of active medical problems listed below. Physiatrist and rehab team continue to assess barriers to discharge/monitor patient progress toward functional and medical goals.  Function:  Bathing Bathing position   Position: Shower  Bathing parts Body parts bathed by patient: Right arm, Left arm, Chest, Abdomen, Right upper leg, Left upper leg, Right lower leg, Left lower leg, Front perineal area Body parts bathed by helper: Back, Buttocks  Bathing assist Assist Level: Touching or steadying assistance(Pt > 75%)      Upper Body Dressing/Undressing Upper body dressing   What is the patient wearing?: Pull over shirt/dress, Orthosis, Bra Bra - Perfomed by patient: Thread/unthread right bra strap, Thread/unthread left bra strap, Hook/unhook bra (pull down sports bra)   Pull over shirt/dress - Perfomed by patient: Thread/unthread right sleeve, Thread/unthread left sleeve, Put head through opening, Pull shirt over trunk       Orthosis activity level: Performed by patient  Upper body assist Assist Level: Touching or steadying assistance(Pt > 75%)      Lower Body Dressing/Undressing Lower body dressing   What is the patient wearing?: Underwear, Pants, Shoes, AFO, Socks Underwear - Performed by patient: Thread/unthread right underwear leg, Thread/unthread left underwear leg, Pull underwear up/down Underwear -  Performed by helper: Pull underwear up/down Pants- Performed by patient: Thread/unthread right pants leg, Thread/unthread left pants leg, Pull pants up/down Pants- Performed by helper: Pull pants up/down Non-skid slipper socks- Performed by patient: Don/doff right sock, Don/doff left sock Non-skid slipper socks- Performed by helper: Don/doff right sock, Don/doff left sock   Socks - Performed by helper: Don/doff right sock,  Don/doff left sock Shoes - Performed by patient: Don/doff right shoe Shoes - Performed by helper: Don/doff right shoe, Don/doff left shoe, Fasten right, Fasten left   AFO - Performed by helper: Don/doff left AFO      Lower body assist Assist for lower body dressing: Touching or steadying assistance (Pt > 75%) (using reacher)      Toileting Toileting   Toileting steps completed by patient: (P) Adjust clothing prior to toileting, Performs perineal hygiene, Adjust clothing after toileting Toileting steps completed by helper: (P) Adjust clothing prior to toileting, Performs perineal hygiene, Adjust clothing after toileting Toileting Assistive Devices: (P) Grab bar or rail  Toileting assist Assist level: (P) Touching or steadying assistance (Pt.75%)   Transfers Chair/bed transfer   Chair/bed transfer method: Stand pivot Chair/bed transfer assist level: Touching or steadying assistance (Pt > 75%) Chair/bed transfer assistive device: Medical sales representative     Max distance: 70 ft Assist level: Touching or steadying assistance (Pt > 75%)   Wheelchair   Type: Manual Max wheelchair distance: 150 ft Assist Level: Supervision or verbal cues  Cognition Comprehension Comprehension assist level: Follows basic conversation/direction with no assist  Expression Expression assist level: Expresses basic needs/ideas: With no assist  Social Interaction Social Interaction assist level: Interacts appropriately with others with medication or extra time (anti-anxiety, antidepressant).  Problem Solving Problem solving assist level: Solves basic 90% of the time/requires cueing < 10% of the time  Memory Memory assist level: Recognizes or recalls 75 - 89% of the time/requires cueing 10 - 24% of the time    Medical Problem List and Plan: 1. Left lower extremity weakness and functional deficitssecondary to lumbar stenosis/L4-S1 radiculopathy s/p lumbar decompression and fusion (with  re-do)  Cont CIR  Left AFO/PRAFO 2. H/o PE/DVT Prophylaxis/Anticoagulation: Mechanical: Sequential compression devices, below knee Bilateral lower extremities  Vascular study with chronic DVT in femoral vein  Lovenox  3. Pain Management: Continue oxycodone with flexeril prn.   Gabapentin d/ced due to potential significant increase in tremors, restarted low dose, pt still not able to tolerate, d/ced  Lyrica 50 TID started 7/27 with ?same side effects, d/ced  Lidodermpatch ordered for left foot  Pamelor started 7/28, increased 7/30 to BID 4. Mood: LCSW to follow for evaluation and support.  5. Neuropsych: This patient iscapable of making decisions on herown behalf. 6. Skin/Wound Care: Monitor wound for healing.  7. Fluids/Electrolytes/Nutrition: Monitor I/O.  8. HTN: Monitor BP bid. On Norvasc, Inderal and HCTZ  Labile, but overall controlled 7/31  Monitor with increased mobility 9. Hemochromatosis: Stable. Has not had to be phlebotomized in 6 years.  10. Restless leg syndrome: On Mirapex 11. Urinary retention:   D/ced foley  Bladder training.   Bethanechol started 7/27, increased on 7/29, increased on 7/30, increased on 7/31  Cont to monitor 12. Constipation: Increased senna to bid. 13. Hypokalemia  K+ 3.6 on 7/31  Supplemented  Cont to monitor 14. Hypoalbuminemia  Supplement initiated 7/25 15. ABLA   Hb 8.4 on 7/31  Cont to monitor 16. Sleep disturbance  Home Temazepam restarted 7/27 17. Acute lower UTI  Macrobid 7/28-7/8/2  LOS (Days) 7 A FACE TO FACE EVALUATION WAS PERFORMED  Altagracia Rone Lorie Phenix 11/25/2016 8:24 AM

## 2016-11-25 NOTE — Plan of Care (Signed)
Problem: SCI BLADDER ELIMINATION Goal: RH STG MANAGE BLADDER WITH ASSISTANCE STG Manage Bladder With min  Assistance   Outcome: Not Progressing Continue I/o cath Q8 hrs ,medication Urecholine started, Strict I/O

## 2016-11-25 NOTE — Progress Notes (Signed)
Occupational Therapy Session Note  Patient Details  Name: [name] MRN: [id number] Date of Birth: 08/04/1940  Today's Date: 11/25/2016 OT Individual Time: 0901-1002 OT Individual Time Calculation (min): 61 min    Short Term Goals: Week 1:  OT Short Term Goal 1 (Week 1): Pt will peform LB bathing sit to stand with supervision. OT Short Term Goal 2 (Week 1): Pt will complete LB dressing sit to stand with min assist using AE PRN. OT Short Term Goal 3 (Week 1): Pt will complete toilet transfers with min assist and min instructional cueing for sequencing walker usage.   OT Short Term Goal 4 (Week 1): Pt will complete walk-in shower transfers with min assist using RW and shower seat.  Skilled Therapeutic Interventions/Progress Updates:    Pt worked on bathing, dressing, and grooming sitting at the sink during session.  Mod instructional cueing needed to follow back precautions to keep from reaching down to the floor to pick up her makeup bag and to try and take her pajamas off.  Once cued, pt was able to utilize there reacher to remove her gripper socks, and other clothing with supervision.  Min assist for sit to stand to wash peri area and for pulling new clothing over hips.  Min instructional cueing to begin donning under shorts and pants over the LLE first.  Pt needed cueing to not try and stand unsupported to donn back orthosis but instead donn it in sitting and then stand to tighten it one hand at a time if needed.  Pt with noted redness at her incision site, nursing made aware.  Pt left sitting in wheelchair at end of session with call button and phone in reach.   Therapy Documentation Precautions:  Precautions Precautions: Back, Fall Precaution Booklet Issued: No Precaution Comments: No twisting/bending, no lifting over 5 lbs, no driving for 2 weeks  Required Braces or Orthoses: Spinal Brace Spinal Brace: Lumbar corset, Applied in sitting position Other Brace/Splint: Left  AFO Restrictions Weight Bearing Restrictions: No  Pain: Pain Assessment Pain Assessment: 0-10 Pain Score: 3  Pain Type: Acute pain Pain Location: Back Pain Orientation: Left Pain Radiating Towards: down left leg Pain Descriptors / Indicators: Sharp;Shooting Pain Frequency: Intermittent Pain Onset: On-going Pain Intervention(s): Medication (See eMAR);Repositioned ADL: See Function Navigator for Current Functional Status.   Therapy/Group: Individual Therapy  Innocence Schlotzhauer OTR/L 11/25/2016, 12:14 PM

## 2016-11-26 ENCOUNTER — Inpatient Hospital Stay (HOSPITAL_COMMUNITY): Payer: PPO | Admitting: Occupational Therapy

## 2016-11-26 ENCOUNTER — Inpatient Hospital Stay (HOSPITAL_COMMUNITY): Payer: PPO

## 2016-11-26 MED ORDER — METHOCARBAMOL 500 MG PO TABS
500.0000 mg | ORAL_TABLET | Freq: Four times a day (QID) | ORAL | Status: DC | PRN
  Administered 2016-11-26 – 2016-12-01 (×3): 500 mg via ORAL
  Filled 2016-11-26 (×3): qty 1

## 2016-11-26 NOTE — Progress Notes (Signed)
Occupational Therapy Session Note  Patient Details  Name: [name] MRN: [id number] Date of Birth: 06-Jun-1940  Today's Date: 11/26/2016 OT Individual Time: 0998-3382 OT Individual Time Calculation (min): 57 min    Short Term Goals: Week 1:  OT Short Term Goal 1 (Week 1): Pt will peform LB bathing sit to stand with supervision. OT Short Term Goal 2 (Week 1): Pt will complete LB dressing sit to stand with min assist using AE PRN. OT Short Term Goal 3 (Week 1): Pt will complete toilet transfers with min assist and min instructional cueing for sequencing walker usage.   OT Short Term Goal 4 (Week 1): Pt will complete walk-in shower transfers with min assist using RW and shower seat.  Skilled Therapeutic Interventions/Progress Updates:    Pt completed transfer into the walk-in shower with min guard assist using the RW.  Once on the seat she was able to remove all clothing, with use of AE PRN and then complete shower sit to stand with supervision.  LH sponge used for washing LEs, with mod instructional cueing for pt to recall that she is not supposed to bend down.  After drying off in the shower she donned all UB clothing and back brace with supervision.  She then ambulated out to the wheelchair for LB dressing.  Used the reacher and sock aide appropriately with increased time.  She was able to cross the RLE over the left knee and donn her sock without use of the reacher or sockaide.  Min guard assist for sit to stand for pulling underpants and pants over hips.  Pt finished session sitting in wheelchair working on brushing and preparing her hair.    Therapy Documentation Precautions:  Precautions Precautions: Back, Fall Precaution Booklet Issued: No Precaution Comments: No twisting/bending, no lifting over 5 lbs, no driving for 2 weeks  Required Braces or Orthoses: Spinal Brace Spinal Brace: Lumbar corset, Applied in sitting position Other Brace/Splint: Left AFO Restrictions Weight Bearing  Restrictions: No   Pain: Pain Assessment Pain Assessment: No/denies pain ADL: See Function Navigator for Current Functional Status.   Therapy/Group: Individual Therapy  Eleanora Guinyard OTR/L 11/26/2016, 10:00 AM

## 2016-11-26 NOTE — Progress Notes (Signed)
Occupational Therapy Weekly Progress Note  Patient Details  Name: [name] MRN: [id number] Date of Birth: 1941/03/02  Beginning of progress report period: November 19, 2016 End of progress report period: November 26, 2016  Today's Date: 11/26/2016 OT Individual Time: 5909-3112 OT Individual Time Calculation (min): 57 min    Patient has met 3 of 4 short term goals.  Pt is making steady progress with OT to current min assist to min guard assist for selfcare tasks.  She continues to need mod instructional cueing to state and follow her current back precautions and to remember to push up from the surface she is sitting on with sit to stand as well as to reach back for arm rests when sitting.  Max assist is needed to donn left shoe with AFO secondary to decreased ankle control and foot drop.  Feel there may have to be some adjustments made to AFO and/or shoe to make this easier for pt and family to use at home.  Overall pt is making steady progress with OT and is on target for supervision level goals with expectations of discharge 8/7.   Patient continues to demonstrate the following deficits: muscle weakness, impaired timing and sequencing and unbalanced muscle activation, decreased safety awareness and decreased memory and decreased standing balance, decreased balance strategies and difficulty maintaining precautions and therefore will continue to benefit from skilled OT intervention to enhance overall performance with BADL.  Patient progressing toward long term goals..  Continue plan of care.  OT Short Term Goals Week 2:  OT Short Term Goal 1 (Week 2): Continue with current LTGs set at supervision overall.    Skilled Therapeutic Interventions/Progress Updates:   Pt completed supine to sit EOB with supervision.  She was able to donn her back brace with supervision as well and then worked on donning her shoes.  She was able to donn the right shoe with min assist but needed max assist for the left and  AFO.  Pt next transferred over to the wheelchair with min assist, which was then positioned in front of the sink.  Worked on sit to stand and standing while rolling and curling her hair.  She was able to stand with min guard assist for intervals of 4-5 mins before needing to sit secondary to fatigue and LE weakness.  Once finished, pt was left sitting in the wheelchair with call button and phone in reach.    Therapy Documentation Precautions:  Precautions Precautions: Back, Fall Precaution Booklet Issued: No Precaution Comments: No twisting/bending, no lifting over 5 lbs, no driving for 2 weeks  Required Braces or Orthoses: Spinal Brace Spinal Brace: Lumbar corset, Applied in sitting position Other Brace/Splint: Left AFO Restrictions Weight Bearing Restrictions: No  Pain: Pain Assessment Pain Assessment: No/denies pain ADL: See Function Navigator for Current Functional Status.   Therapy/Group: Individual Therapy  Levern Pitter OTR/L 11/26/2016, 2:14 PM

## 2016-11-26 NOTE — Progress Notes (Addendum)
[facility]     PROGRESS NOTE  Subjective/Complaints:  Pt seen laying in bed this AM.  She slept well overnight.  She notes left buttock pain this AM, improved with medication.    ROS: Denies CP, SOB, N/V/D.  Objective: Vital Signs: Blood pressure 135/70, pulse 79, temperature 98.2 F (36.8 C), temperature source Oral, resp. rate 16, height 5\' 2"  (1.575 m), weight 84 kg (185 lb 3 oz), SpO2 100 %. No results found.  Recent Labs  11/24/16 1058 11/25/16 0437  WBC 6.8 5.5  HGB 9.3* 8.4*  HCT 29.4* 26.9*  PLT 305 267    Recent Labs  11/24/16 1058 11/25/16 0437  NA 135 135  K 3.5 3.6  CL 97* 96*  GLUCOSE 105* 90  BUN 14 12  CREATININE 1.00 0.93  CALCIUM 9.0 8.8*   CBG (last 3)  No results for input(s): GLUCAP in the last 72 hours.  Wt Readings from Last 3 Encounters:  11/26/16 84 kg (185 lb 3 oz)  11/13/16 90.7 kg (200 lb)  11/03/16 82.9 kg (182 lb 12.2 oz)    Physical Exam:  BP 135/70 (BP Location: Right Arm)   Pulse 79   Temp 98.2 F (36.8 C) (Oral)   Resp 16   Ht 5\' 2"  (1.575 m)   Wt 84 kg (185 lb 3 oz)   SpO2 100%   BMI 33.87 kg/m  Constitutional: She appears well-developedand well-nourished.  HENT: Normocephalicand atraumatic.  Eyes: EOMI. No discharge. Cardiovascular: RRR. No JVD. Respiratory: Effort normal and breath sounds normal. GI: Soft. Bowel sounds are normal.   Musculoskeletal: She exhibits no edemaor tenderness.  Neurological: She is alertand oriented  Speech clear.  Motor: B/l UE 5/5.  RLE: 4/5 HF, 4+/5 KE, 5/5 ADF/PF LLE: HF 4/5, KE 4+/5, ADF/PF 1+/5 (unchanged).  Skin: Skin is warmand dry. Incision c/d/i Psychiatric: She has a normal mood and affect. Her behavior is normal. Judgmentand thought contentnormal.   Assessment/Plan: 1. Functional deficits secondary to lumbar stenosis/L4-S1 radiculopathy s/p lumbar decompression and fusion  which require 3+ hours per day of interdisciplinary therapy  in a comprehensive inpatient rehab setting. Physiatrist is providing close team supervision and 24 hour management of active medical problems listed below. Physiatrist and rehab team continue to assess barriers to discharge/monitor patient progress toward functional and medical goals.  Function:  Bathing Bathing position   Position: Wheelchair/chair at sink  Bathing parts Body parts bathed by patient: Right arm, Left arm, Chest, Abdomen, Right upper leg, Left upper leg, Right lower leg, Left lower leg, Front perineal area, Buttocks Body parts bathed by helper: Back  Bathing assist Assist Level: Touching or steadying assistance(Pt > 75%)      Upper Body Dressing/Undressing Upper body dressing   What is the patient wearing?: Pull over shirt/dress, Orthosis, Bra Bra - Perfomed by patient: Thread/unthread right bra strap, Thread/unthread left bra strap, Hook/unhook bra (pull down sports bra)   Pull over shirt/dress - Perfomed by patient: Thread/unthread right sleeve, Thread/unthread left sleeve, Put head through opening, Pull shirt over trunk       Orthosis activity level: Performed by patient  Upper body assist Assist Level: Supervision or verbal cues      Lower Body Dressing/Undressing Lower body dressing   What is the patient wearing?: Underwear, Pants, Shoes, AFO, Socks Underwear - Performed by patient: Thread/unthread right underwear leg, Thread/unthread left underwear leg, Pull underwear up/down Underwear - Performed by helper: Pull underwear up/down Pants- Performed by patient:  Thread/unthread right pants leg, Thread/unthread left pants leg, Pull pants up/down Pants- Performed by helper: Pull pants up/down Non-skid slipper socks- Performed by patient: Don/doff right sock, Don/doff left sock Non-skid slipper socks- Performed by helper: Don/doff right sock, Don/doff left sock   Socks - Performed by helper: Don/doff right sock, Don/doff left sock Shoes - Performed by patient:  Don/doff right shoe Shoes - Performed by helper: Don/doff right shoe, Don/doff left shoe, Fasten right, Fasten left   AFO - Performed by helper: Don/doff left AFO      Lower body assist Assist for lower body dressing: Touching or steadying assistance (Pt > 75%) (using reacher)      Toileting Toileting   Toileting steps completed by patient: Adjust clothing prior to toileting, Performs perineal hygiene, Adjust clothing after toileting Toileting steps completed by helper: Adjust clothing prior to toileting, Performs perineal hygiene, Adjust clothing after toileting Toileting Assistive Devices: Grab bar or rail  Toileting assist Assist level: Touching or steadying assistance (Pt.75%)   Transfers Chair/bed transfer   Chair/bed transfer method: Stand pivot Chair/bed transfer assist level: Touching or steadying assistance (Pt > 75%) Chair/bed transfer assistive device: Medical sales representative     Max distance: 50 ft Assist level: Touching or steadying assistance (Pt > 75%)   Wheelchair   Type: Manual Max wheelchair distance: 150 ft Assist Level: Supervision or verbal cues  Cognition Comprehension Comprehension assist level: Follows basic conversation/direction with no assist, Follows complex conversation/direction with no assist  Expression Expression assist level: Expresses complex 90% of the time/cues < 10% of the time  Social Interaction Social Interaction assist level: Interacts appropriately with others with medication or extra time (anti-anxiety, antidepressant).  Problem Solving Problem solving assist level: Solves basic 75 - 89% of the time/requires cueing 10 - 24% of the time  Memory Memory assist level: Recognizes or recalls 50 - 74% of the time/requires cueing 25 - 49% of the time    Medical Problem List and Plan: 1. Left lower extremity weakness and functional deficitssecondary to lumbar stenosis/L4-S1 radiculopathy s/p lumbar decompression and fusion (with  re-do)  Cont CIR  Left AFO/PRAFO 2. H/o PE/DVT Prophylaxis/Anticoagulation: Mechanical: Sequential compression devices, below knee Bilateral lower extremities  Vascular study with chronic DVT in femoral vein  Lovenox  3. Pain Management: Continue oxycodone with flexeril prn.   Gabapentin d/ced due to potential significant increase in tremors, restarted low dose, pt still not able to tolerate, d/ced  Lyrica 50 TID started 7/27 with ?same side effects, d/ced  Lidodermpatch ordered for left foot  Pamelor started 7/28, increased 7/30 to BID  Improved 4. Mood: LCSW to follow for evaluation and support.  5. Neuropsych: This patient iscapable of making decisions on herown behalf. 6. Skin/Wound Care: Monitor wound for healing.  7. Fluids/Electrolytes/Nutrition: Monitor I/O.  8. HTN: Monitor BP bid. On Norvasc, Inderal and HCTZ  Controlled 8/1  Monitor with increased mobility 9. Hemochromatosis: Stable. Has not had to be phlebotomized in 6 years.  10. Restless leg syndrome: On Mirapex 11. Urinary retention:   D/ced foley  Bladder training.   Bethanechol started 7/27, increased on 7/29, increased on 7/30, increased on 7/31  Cont to monitor 12. Constipation: Increased senna to bid. 13. Hypokalemia  K+ 3.6 on 7/31  Supplemented  Cont to monitor 14. Hypoalbuminemia  Supplement initiated 7/25 15. ABLA   Hb 8.4 on 7/31  Cont to monitor 16. Sleep disturbance  Home Temazepam restarted 7/27 17. Acute lower UTI  Macrobid  7/28-7/8/2  LOS (Days) 8 A FACE TO FACE EVALUATION WAS PERFORMED  [name] Lorie Phenix 11/26/2016 8:31 AM

## 2016-11-26 NOTE — Progress Notes (Signed)
Physical Therapy Session Note  Patient Details  Name: [name] MRN: [id number] Date of Birth: Sep 19, 1940  Today's Date: 11/26/2016 PT Individual Time: 1000-1111 PT Individual Time Calculation (min): 71 min   Short Term Goals: Week 2:  PT Short Term Goal 1 (Week 2): STG=LTG secondary to ELOS  Skilled Therapeutic Interventions/Progress Updates:    Pt sitting in w/c upon PT arrival, agreeable to therapy tx. PT propelled w/c 150 ft to the gym using B LEs with supervision. Pt performed 1 x 5 sit to stands with supervision and verbal cues for technique to maintain precautions. Pt standing using RW for UE support performed 1 x 10 hip flexion with each LE requiring min assist to maintain balance. Pt standing with RW worked on dynamic standing balance to perform toe taps on 6 inch step to tap specific number, pt required min assist to maintain balance, x 2 trials. Pt ambulated x 70 ft with RW and min assist, verbal cues for step length and trunk extension Pt ambulated around cones working on obstacle navigation using RW and min assist, difficulty with foot placement and coordination turning at times. Pt transferred from w/c to mat via ambulation with RW and min assist. Pt transferred from sitting edge of mat to supine reclined on edge with supervision and verbal cues to maintain precautions. Pt performed 2 x 10 hip abduction AROM with maxi slide. 2 x 10 hip abduction with knee bent against manual resistance, 6 sec holds. Pt performed 3 x 8 reps D1 extension PNF pattern contract relax with manual resistance.   Therapy Documentation Precautions:  Precautions Precautions: Back, Fall Precaution Booklet Issued: No Precaution Comments: No twisting/bending, no lifting over 5 lbs, no driving for 2 weeks  Required Braces or Orthoses: Spinal Brace Spinal Brace: Lumbar corset, Applied in sitting position Other Brace/Splint: Left AFO Restrictions Weight Bearing Restrictions: No  Pain: Pain  Assessment Pain Assessment: No/denies pain this session   See Function Navigator for Current Functional Status.   Therapy/Group: Individual Therapy  [name], PT, DPT 11/26/2016, 10:16 AM

## 2016-11-27 ENCOUNTER — Inpatient Hospital Stay (HOSPITAL_COMMUNITY): Payer: PPO | Admitting: Occupational Therapy

## 2016-11-27 ENCOUNTER — Inpatient Hospital Stay (HOSPITAL_COMMUNITY): Payer: PPO | Admitting: Physical Therapy

## 2016-11-27 NOTE — Progress Notes (Signed)
[facility]     PROGRESS NOTE  Subjective/Complaints:  Pt seen laying in bed this AM.  She slept well overnight until she was woken up this AM.  She denies jerking and notes improvement in pain.   ROS: Denies CP, SOB, N/V/D.  Objective: Vital Signs: Blood pressure (!) 138/52, pulse 72, temperature 98.1 F (36.7 C), temperature source Oral, resp. rate 16, height 5\' 2"  (1.575 m), weight 84 kg (185 lb 3 oz), SpO2 92 %. No results found.  Recent Labs  11/24/16 1058 11/25/16 0437  WBC 6.8 5.5  HGB 9.3* 8.4*  HCT 29.4* 26.9*  PLT 305 267    Recent Labs  11/24/16 1058 11/25/16 0437  NA 135 135  K 3.5 3.6  CL 97* 96*  GLUCOSE 105* 90  BUN 14 12  CREATININE 1.00 0.93  CALCIUM 9.0 8.8*   CBG (last 3)  No results for input(s): GLUCAP in the last 72 hours.  Wt Readings from Last 3 Encounters:  11/26/16 84 kg (185 lb 3 oz)  11/13/16 90.7 kg (200 lb)  11/03/16 82.9 kg (182 lb 12.2 oz)    Physical Exam:  BP (!) 138/52 (BP Location: Left Arm)   Pulse 72   Temp 98.1 F (36.7 C) (Oral)   Resp 16   Ht 5\' 2"  (1.575 m)   Wt 84 kg (185 lb 3 oz)   SpO2 92%   BMI 33.87 kg/m  Constitutional: She appears well-developedand well-nourished.  HENT: Normocephalicand atraumatic.  Eyes: EOMI. No discharge. Cardiovascular: RRR. No JVD. Respiratory: Effort normal and breath sounds normal. GI: Soft. Bowel sounds are normal.   Musculoskeletal: She exhibits no edemaor tenderness.  Neurological: She is alertand oriented  Speech clear.  Motor: B/l UE 5/5.  RLE: 4/5 HF, 4+/5 KE, 5/5 ADF/PF LLE: HF 4/5, KE 4+/5, ADF/PF 1+/5 (stable).  Skin: Skin is warmand dry. Incision c/d/i Psychiatric: She has a normal mood and affect. Her behavior is normal. Judgmentand thought contentnormal.   Assessment/Plan: 1. Functional deficits secondary to lumbar stenosis/L4-S1 radiculopathy s/p lumbar decompression and fusion  which require 3+ hours per day of  interdisciplinary therapy in a comprehensive inpatient rehab setting. Physiatrist is providing close team supervision and 24 hour management of active medical problems listed below. Physiatrist and rehab team continue to assess barriers to discharge/monitor patient progress toward functional and medical goals.  Function:  Bathing Bathing position   Position: Shower  Bathing parts Body parts bathed by patient: Right arm, Left arm, Chest, Abdomen, Right upper leg, Left upper leg, Right lower leg, Left lower leg, Front perineal area, Buttocks Body parts bathed by helper: Back  Bathing assist Assist Level: Touching or steadying assistance(Pt > 75%)      Upper Body Dressing/Undressing Upper body dressing   What is the patient wearing?: Pull over shirt/dress, Orthosis, Bra Bra - Perfomed by patient: Thread/unthread right bra strap, Thread/unthread left bra strap, Hook/unhook bra (pull down sports bra)   Pull over shirt/dress - Perfomed by patient: Thread/unthread right sleeve, Thread/unthread left sleeve, Put head through opening, Pull shirt over trunk       Orthosis activity level: Performed by patient  Upper body assist Assist Level: Supervision or verbal cues      Lower Body Dressing/Undressing Lower body dressing   What is the patient wearing?: Underwear, Pants, Shoes, AFO, Socks Underwear - Performed by patient: Thread/unthread right underwear leg, Thread/unthread left underwear leg, Pull underwear up/down Underwear - Performed by helper: Pull underwear up/down  Pants- Performed by patient: Thread/unthread right pants leg, Thread/unthread left pants leg, Pull pants up/down Pants- Performed by helper: Pull pants up/down Non-skid slipper socks- Performed by patient: Don/doff right sock, Don/doff left sock Non-skid slipper socks- Performed by helper: Don/doff right sock, Don/doff left sock Socks - Performed by patient: Don/doff right sock, Don/doff left sock Socks - Performed by  helper: Don/doff right sock, Don/doff left sock Shoes - Performed by patient: Don/doff right shoe, Fasten right Shoes - Performed by helper: Don/doff left shoe, Fasten left   AFO - Performed by helper: Don/doff left AFO      Lower body assist Assist for lower body dressing: Touching or steadying assistance (Pt > 75%) (using reacher)      Toileting Toileting   Toileting steps completed by patient: Adjust clothing prior to toileting, Performs perineal hygiene, Adjust clothing after toileting Toileting steps completed by helper: Adjust clothing prior to toileting, Performs perineal hygiene, Adjust clothing after toileting Toileting Assistive Devices: Grab bar or rail  Toileting assist Assist level: Touching or steadying assistance (Pt.75%)   Transfers Chair/bed transfer   Chair/bed transfer method: Stand pivot Chair/bed transfer assist level: Touching or steadying assistance (Pt > 75%) Chair/bed transfer assistive device: Medical sales representative     Max distance: 70 ft Assist level: Touching or steadying assistance (Pt > 75%)   Wheelchair   Type: Manual Max wheelchair distance: 150 ft Assist Level: Supervision or verbal cues  Cognition Comprehension Comprehension assist level: Follows basic conversation/direction with no assist, Follows complex conversation/direction with no assist  Expression Expression assist level: Expresses basic needs/ideas: With no assist  Social Interaction Social Interaction assist level: Interacts appropriately with others with medication or extra time (anti-anxiety, antidepressant).  Problem Solving Problem solving assist level: Solves basic 75 - 89% of the time/requires cueing 10 - 24% of the time  Memory Memory assist level: Recognizes or recalls 50 - 74% of the time/requires cueing 25 - 49% of the time    Medical Problem List and Plan: 1. Left lower extremity weakness and functional deficitssecondary to lumbar stenosis/L4-S1  radiculopathy s/p lumbar decompression and fusion (with re-do)  Cont CIR  Left AFO/PRAFO 2. H/o PE/DVT Prophylaxis/Anticoagulation: Mechanical: Sequential compression devices, below knee Bilateral lower extremities  Vascular study with chronic DVT in femoral vein  Lovenox  3. Pain Management: Continue oxycodone with flexeril prn.   Gabapentin d/ced due to potential significant increase in tremors, restarted low dose, pt still not able to tolerate, d/ced  Lyrica 50 TID started 7/27 with ?same side effects, d/ced  Lidodermpatch ordered for left foot  Pamelor started 7/28, increased 7/30 to BID  Improving 4. Mood: LCSW to follow for evaluation and support.  5. Neuropsych: This patient iscapable of making decisions on herown behalf. 6. Skin/Wound Care: Monitor wound for healing.  7. Fluids/Electrolytes/Nutrition: Monitor I/O.  8. HTN: Monitor BP bid. On Norvasc, Inderal and HCTZ  Controlled 8/2  Monitor with increased mobility 9. Hemochromatosis: Stable. Has not had to be phlebotomized in 6 years.  10. Restless leg syndrome: On Mirapex 11. Urinary retention:   D/ced foley  Bladder training.   Bethanechol started 7/27, increased on 7/29, increased on 7/30, increased on 7/31  ?Improving  Cont to monitor 12. Constipation: Increased senna to bid. 13. Hypokalemia  K+ 3.6 on 7/31  Labs ordered for tomorrow  Supplemented  Cont to monitor 14. Hypoalbuminemia  Supplement initiated 7/25 15. ABLA   Hb 8.4 on 7/31  Labs ordered for tomorrow  Cont to monitor 16. Sleep disturbance  Home Temazepam restarted 7/27 17. Acute lower UTI  Macrobid 7/28-7/8/2  LOS (Days) 9 A FACE TO FACE EVALUATION WAS PERFORMED  Duard Spiewak Lorie Phenix 11/27/2016 9:05 AM

## 2016-11-27 NOTE — Progress Notes (Signed)
This RN discussed with pt the need to perform I&O cath at home. Pt. stated has concerns about having to perform I&O cath at home. Pt. Reports that her daughter told her she will not help and that husband will also not be able to help. Pt stated that she will be unable to perform cath herself due to poor vision, lack of manual dexterity and significant lower back issues.

## 2016-11-27 NOTE — Progress Notes (Signed)
Occupational Therapy Session Note  Patient Details  Name: [name] MRN: [id number] Date of Birth: [birth year]  Today's Date: 11/27/2016 OT Individual Time: 1334-1430 OT Individual Time Calculation (min): 56 min    Short Term Goals: Week 2:  OT Short Term Goal 1 (Week 2): Continue with current LTGs set at supervision overall.    Skilled Therapeutic Interventions/Progress Updates:    Pt completed supine to sit EOB with supervision and increased time, following back precautions.  She then transferred to the wheelchair with min guard assist stand pivot with use of the RW.  Next she rolled the wheelchair down to the ADL apartment where she practiced transfers from the wheelchair to the bed and to the couch.  Min assist for transfer from sitting to supine in the bed with supervision to sit back up.  Next had her work on hanging up clothes using the RW to transport them from EOB to simulated closet.  Also had her place them in the upper drawers as to avoid bending, after taking them off of the hangers.  She continues to need min to mod instructional cueing to follow back precautions.  Returned to room via wheelchair at end of session with pt transferring back to bed with min assist.  Call button and phone in reach.    Therapy Documentation Precautions:  Precautions Precautions: Back, Fall Precaution Booklet Issued: No Precaution Comments: No twisting/bending, no lifting over 5 lbs, no driving for 2 weeks  Required Braces or Orthoses: Spinal Brace Spinal Brace: Lumbar corset, Applied in sitting position Other Brace/Splint: Left AFO Restrictions Weight Bearing Restrictions: No  Pain: Pain Assessment Pain Assessment: Faces Faces Pain Scale: Hurts a little bit Pain Type: Surgical pain Pain Location: Hip Pain Orientation: Right Pain Descriptors / Indicators: Discomfort Pain Onset: Gradual Pain Intervention(s): Repositioned ADL: See Function Navigator for Current Functional  Status.   Therapy/Group: Individual Therapy  Rubi Tooley OTR/L 11/27/2016, 4:22 PM

## 2016-11-27 NOTE — Progress Notes (Signed)
Occupational Therapy Session Note  Patient Details  Name: [name] MRN: [id number] Date of Birth: January 24, 1941  Today's Date: 11/27/2016 OT Individual Time: 1025-8527 OT Individual Time Calculation (min): 56 min    Short Term Goals: Week 2:  OT Short Term Goal 1 (Week 2): Continue with current LTGs set at supervision overall.    Skilled Therapeutic Interventions/Progress Updates:    Pt completed transfer into the walk-in shower with min guard assist using the RW.  Once on the seat she was able to remove all clothing, with use of AE PRN and then complete shower sit to stand with min guard assist as well.  LH sponge used for washing LEs, and reacher used to dry them.  After drying off in the shower she donned all UB clothing and back brace with setup.  She then ambulated out to the wheelchair for LB dressing.  Used the reacher and sock aide appropriately with increased time.  She was able to cross the RLE over the left knee and donn her sock without use of the reacher or sockaide.  Min guard assist for sit to stand for pulling underpants and pants over hips.  Max assist for donning left shoe and AFO.  Pt finished session with transfer to the toilet with pt stating she would pull call button when she was ready to get up.  NT notified as well.    Therapy Documentation Precautions:  Precautions Precautions: Back, Fall Precaution Booklet Issued: No Precaution Comments: No twisting/bending, no lifting over 5 lbs, no driving for 2 weeks  Required Braces or Orthoses: Spinal Brace Spinal Brace: Lumbar corset, Applied in sitting position Other Brace/Splint: Left AFO Restrictions Weight Bearing Restrictions: No  Pain: Pain Assessment Pain Assessment: No/denies pain ADL: See Function Navigator for Current Functional Status.   Therapy/Group: Individual Therapy  Allanna Bresee OTR/L 11/27/2016, 12:58 PM

## 2016-11-27 NOTE — Progress Notes (Signed)
Physical Therapy Session Note  Patient Details  Name: [name] MRN: [id number] Date of Birth: 04-13-1941  Today's Date: 11/27/2016 PT Individual Time: 0917-1030 PT Individual Time Calculation (min): 73 min   Short Term Goals: Week 1:  PT Short Term Goal 1 (Week 1): Pt will transfer bed<>chair via stand pivot transfer w/ Min A  PT Short Term Goal 1 - Progress (Week 1): Met PT Short Term Goal 2 (Week 1): Pt will ambulate 25 ft w/ Min A w/ RW PT Short Term Goal 2 - Progress (Week 1): Met PT Short Term Goal 3 (Week 1): Pt will maintain dynamic standing balance w/ supervision and UE support for 30 seconds PT Short Term Goal 3 - Progress (Week 1): Met PT Short Term Goal 4 (Week 1): Pt will propel w/c 150 ft w/ supervision using bilateral UEs PT Short Term Goal 4 - Progress (Week 1): Met PT Short Term Goal 5 (Week 1): Pt will negotiate 4 steps w/ Min A w/ bilateral UE support PT Short Term Goal 5 - Progress (Week 1): Met Week 2:  PT Short Term Goal 1 (Week 2): STG=LTG secondary to ELOS  Skilled Therapeutic Interventions/Progress Updates:   Pt in w/c upon arrival and agreeable to therapy, no c/o pain.   Pt ambulated to/from toilet w/ RW, Min guard. Pt performed all toilet hygiene and pericare w/ supervision.   Assisted pt in donning LAFO secondary to surgical precautions. Spoke w/ rep from [employer] on phone, he had checked L AFO in person yesterday (8/1) and said it is fitting correctly. Pt and husband have trouble donning/doffing AFO independently, will continue to educate pt and husband prior to discharge and monitor L AFO fit during sessions.   LE strengthening exercises w/ supervision in stance, R/L single leg stance w/ RW UE support, verbal/tactile cues to decrease hip drop during L single leg stance. Pt able to correct initially, hip abductors fatigue quickly w/ repetition.   Gait training w/ RW and rollator - tactile and verbal cues to decrease R hip drop during L stance phase,  increased muscular fatigue w/ this gait pattern, resolves w/ seated rest breaks. 63' at a time w/ Min guard. Will continue to assess rollator vs. RW. Per pt's report, her house is mostly carpeted and the rollator is easier to use for her. She is safe to use either AD while ambulating w/ therapy.   Ended session in w/c, call bell within reach and all needs met.   Therapy Documentation Precautions:  Precautions Precautions: Back, Fall Precaution Booklet Issued: No Precaution Comments: No twisting/bending, no lifting over 5 lbs, no driving for 2 weeks  Required Braces or Orthoses: Spinal Brace Spinal Brace: Lumbar corset, Applied in sitting position Other Brace/Splint: Left AFO Restrictions Weight Bearing Restrictions: No  See Function Navigator for Current Functional Status.   Therapy/Group: Individual Therapy  Machell Wirthlin K Arnette 11/27/2016, 9:53 AM

## 2016-11-28 ENCOUNTER — Inpatient Hospital Stay (HOSPITAL_COMMUNITY): Payer: PPO | Admitting: Occupational Therapy

## 2016-11-28 ENCOUNTER — Inpatient Hospital Stay (HOSPITAL_COMMUNITY): Payer: PPO | Admitting: Physical Therapy

## 2016-11-28 LAB — CBC WITH DIFFERENTIAL/PLATELET
BASOS ABS: 0 10*3/uL (ref 0.0–0.1)
Basophils Relative: 1 %
EOS PCT: 9 %
Eosinophils Absolute: 0.6 10*3/uL (ref 0.0–0.7)
HCT: 26.7 % — ABNORMAL LOW (ref 36.0–46.0)
Hemoglobin: 8.4 g/dL — ABNORMAL LOW (ref 12.0–15.0)
LYMPHS ABS: 1.6 10*3/uL (ref 0.7–4.0)
LYMPHS PCT: 25 %
MCH: 25.8 pg — AB (ref 26.0–34.0)
MCHC: 31.5 g/dL (ref 30.0–36.0)
MCV: 82.2 fL (ref 78.0–100.0)
MONO ABS: 0.5 10*3/uL (ref 0.1–1.0)
Monocytes Relative: 7 %
Neutro Abs: 3.8 10*3/uL (ref 1.7–7.7)
Neutrophils Relative %: 58 %
PLATELETS: 294 10*3/uL (ref 150–400)
RBC: 3.25 MIL/uL — ABNORMAL LOW (ref 3.87–5.11)
RDW: 15.4 % (ref 11.5–15.5)
WBC: 6.5 10*3/uL (ref 4.0–10.5)

## 2016-11-28 LAB — BASIC METABOLIC PANEL
Anion gap: 7 (ref 5–15)
BUN: 13 mg/dL (ref 6–20)
CO2: 29 mmol/L (ref 22–32)
Calcium: 9.1 mg/dL (ref 8.9–10.3)
Chloride: 100 mmol/L — ABNORMAL LOW (ref 101–111)
Creatinine, Ser: 0.95 mg/dL (ref 0.44–1.00)
GFR calc Af Amer: >60 mL/min (ref >60)
GFR, EST NON AFRICAN AMERICAN: 57 mL/min — AB (ref >60)
GLUCOSE: 98 mg/dL (ref 65–99)
POTASSIUM: 3.5 mmol/L (ref 3.5–5.1)
Sodium: 136 mmol/L (ref 135–145)

## 2016-11-28 MED ORDER — DOXYCYCLINE HYCLATE 100 MG PO TABS
100.0000 mg | ORAL_TABLET | Freq: Two times a day (BID) | ORAL | Status: DC
  Administered 2016-11-28 – 2016-12-02 (×9): 100 mg via ORAL
  Filled 2016-11-28 (×9): qty 1

## 2016-11-28 NOTE — Progress Notes (Signed)
Occupational Therapy Session Note  Patient Details  Name: [name] MRN: [id number] Date of Birth: 1941-02-19  Today's Date: 11/28/2016 OT Individual Time: 1300-1415 OT Individual Time Calculation (min): 75 min    Short Term Goals: Week 2:  OT Short Term Goal 1 (Week 2): Continue with current LTGs set at supervision overall.    Skilled Therapeutic Interventions/Progress Updates:    Pt ambulated to the therapy gym with min guard assist using the RW.  Once in the gym, had her work on sit to stand transitions, standing balance, and functional mobility.  Had pt work in standing to [employer] and then used the walker with reacher to pick them up from the floor.  Min instructional cueing to position walker closer in order to retrieve items from the floor with the reacher without breaking her back precautions by bending.  Performed several intervals with min guard assist and min instructional cueing for hand placement.  Ambulated back to the room where pt transitioned to the EOB and worked on removing her shoes and socks.  She was able to cross both LEs over the opposite knee this session for doffing socks.  Before transitioning to supine had her work on donning sock on the left foot and then working on donning her AFO and shoe by keeping the LLE crossed.  She was able to complete with increased time and min assist.  Finished session with pt transferring to supine with supervision.  Call button and phone in reach.    Therapy Documentation Precautions:  Precautions Precautions: Back, Fall Precaution Booklet Issued: No Precaution Comments: No twisting/bending, no lifting over 5 lbs, no driving for 2 weeks  Required Braces or Orthoses: Spinal Brace Spinal Brace: Lumbar corset, Applied in sitting position Other Brace/Splint: Left AFO Restrictions Weight Bearing Restrictions: No  Pain: Pain Assessment Pain Assessment: 0-10 Pain Score: 6  Pain Type: Surgical pain Pain Location: Back Pain  Orientation: Left Pain Radiating Towards: hip Pain Descriptors / Indicators: Discomfort Patients Stated Pain Goal: 3 Pain Intervention(s): Medication (See eMAR) ADL: See Function Navigator for Current Functional Status.   Therapy/Group: Individual Therapy  Yandiel Bergum OTR/L 11/28/2016, 3:47 PM

## 2016-11-28 NOTE — Progress Notes (Signed)
Physical Therapy Session Note  Patient Details  Name: ROCHEL PRIVETT MRN: [id number] Date of Birth: 1940/07/19  Today's Date: 11/28/2016 PT Individual Time: 0900-1000 PT Individual Time Calculation (min): 60 min   Short Term Goals: Week 2:  PT Short Term Goal 1 (Week 2): STG=LTG secondary to ELOS  Skilled Therapeutic Interventions/Progress Updates:   Pt received sitting in WC and agreeable to PT  WC mobility x 141f with supervision assist with BUR and BLE propulsion technique.   Gait training with supervision assist and Rollator [id number] provided intermittent verbal and tactile cues for improved L gluteal activation to improve trendelenburg gait pattern.   Stand pivot transfer training with supervision assist x 5 throughout treatment with supervision assist and min cues for safety and use of wheel locks on rollator.   PT instructed pt in seated BLE therex:  LAQ x12 BLE.  HS curls x 12 BLE  Hip abduction AROM on the LLE and manual resistance on RLE 2 x 12 BLE Reciprocal marches 2x 12 Hip extension with level 2 tband x 12 BLE  Throughout seated thererx, PT provided supervision assist and min-mod cues for decreased compensatory movements through the trunk as well as improved ROM and decreased speed to improve Neuromotor control  Patient returned too room and [facility] call bell in reach and all needs met.        Therapy Documentation Precautions:  Precautions Precautions: Back, Fall Precaution Booklet Issued: No Precaution Comments: No twisting/bending, no lifting over 5 lbs, no driving for 2 weeks  Required Braces or Orthoses: Spinal Brace Spinal Brace: Lumbar corset, Applied in sitting position Other Brace/Splint: Left AFO Restrictions Weight Bearing Restrictions: No Vital Signs: Therapy Vitals Temp: 97.6 F (36.4 C) Temp Source: Oral Pulse Rate: 68 Resp: 18 BP: (!) 129/57 Patient Position (if appropriate): Lying Oxygen  Therapy SpO2: 98 % O2 Device: Not Delivered Pain: 0/10   See Function Navigator for Current Functional Status.   Therapy/Group: Individual Therapy  AustiLorie Phenix2018, 10:03 AM

## 2016-11-28 NOTE — Progress Notes (Signed)
Social Work Patient ID: [name], female   DOB: 09/26/1940, 76 y.o.   MRN: [id number]   CSW met with pt and with her husband to update them on team conference discussion and discuss targeted d/c date of 12-02-16.  Pt's husband plans to come for training over the weekend.  Pt is agreeable to have HH at home for nursing care and f/u therapies.  CSW will arrange this.  Pt had a nurse from the health department in the past, but CSW is not sure this is a service anymore.  CSW to arrange DME and HH.  Pt/husband appreciative.  

## 2016-11-28 NOTE — Progress Notes (Signed)
Patient ID: [name], female   DOB: 08/18/1940, 76 y.o.   MRN: [id number] Subjective:  The patient is alert and pleasant. She looks better.  Objective: Vital signs in last 24 hours: Temp:  [97.6 F (36.4 C)-98.6 F (37 C)] 97.6 F (36.4 C) (08/03 0606) Pulse Rate:  [68-86] 68 (08/03 0606) Resp:  [18] 18 (08/03 0606) BP: (123-132)/(54-57) 129/57 (08/03 0606) SpO2:  [97 %-98 %] 98 % (08/03 0606)  Intake/Output from previous day: 08/02 0701 - 08/03 0700 In: 480 [P.O.:480] Out: 2900 [Urine:2900] Intake/Output this shift: Total I/O In: 180 [P.O.:180] Out: -   Physical exam the patient is alert and oriented. Her strength is grossly normal in her bilateral lower extremities except she has a left foot drop.  The patient's wound has a superficial dehiscence, some wound edge erythema and a small amount of discharge.  Lab Results:  Recent Labs  11/28/16 0526  WBC 6.5  HGB 8.4*  HCT 26.7*  PLT 294   BMET  Recent Labs  11/28/16 0526  NA 136  K 3.5  CL 100*  CO2 29  GLUCOSE 98  BUN 13  CREATININE 0.95  CALCIUM 9.1    Studies/Results: No results found.  Assessment/Plan: Wound dehiscence: The patient is just over 3 weeks status post her surgery. I'll start her on empiric Keflex to hopefully prevent a secondary infection. Will continue daily dressing changes.  LOS: 10 days     Maeven Mcdougall D 11/28/2016, 10:47 AM

## 2016-11-28 NOTE — Progress Notes (Signed)
Occupational Therapy Session Note  Patient Details  Name: [name] MRN: [id number] Date of Birth: 1940/09/03  Today's Date: 11/28/2016 OT Individual Time: 0815-0900 OT Individual Time Calculation (min): 45 min     Skilled Therapeutic Interventions/Progress Updates:    Pt worked on bathing and dressing sit to stand at the sink.  AE utilized appropriately for LB bathing and dressing to wash below her knees and to donn clothing over her feet.  She continues to need min instructional cueing for back precautions with min guard assist for sit to stand and standing when pulling items over hips.  She was able to complete grooming tasks of brushing her hair, applying makeup, and brushing her teeth.  Pt left sitting in wheelchair in preparation for next session.   Therapy Documentation Precautions:  Precautions Precautions: Back, Fall Precaution Booklet Issued: No Precaution Comments: No twisting/bending, no lifting over 5 lbs, no driving for 2 weeks  Required Braces or Orthoses: Spinal Brace Spinal Brace: Lumbar corset, Applied in sitting position Other Brace/Splint: Left AFO Restrictions Weight Bearing Restrictions: No  Pain: Pain Assessment Pain Assessment: 0-10 Pain Score: 3  Pain Location: Hip Pain Orientation: Left Pain Descriptors / Indicators: Aching Patients Stated Pain Goal: 3 Pain Intervention(s): Medication (See eMAR) ADL: See Function Navigator for Current Functional Status.   Therapy/Group: Individual Therapy  Elena Cothern OTR/L 11/28/2016, 12:19 PM

## 2016-11-28 NOTE — Progress Notes (Addendum)
Social Work Patient ID: [name], female   DOB: 27-Jun-1940, 76 y.o.   MRN: [id number]  Met with pt to schedule family education with husband on Sunday @ 9:00 this way husband will not need to take off from work. Discussed wheelchair and bedside commode with pt and will make referral to [facility]. Work toward discharge Tuesday. Amy-PT aware of education and will let Emily-PT know who will be here Sunday.

## 2016-11-28 NOTE — Patient Care Conference (Signed)
Inpatient RehabilitationTeam Conference and Plan of Care Update Date: 11/26/2016   Time: 2:15 PM    Patient Name: [name]      Medical Record Number: [id number]  Date of Birth: 1940-07-07 Sex: Female         Room/Bed: 4M12C/4M12C-01 Payor Info: Payor: [employer]AGE / Plan: Tennis Must / Product Type: *No Product type* /    Admitting Diagnosis: lumbar fusion  Admit Date/Time:  11/18/2016  3:35 PM Admission Comments: No comment available   Primary Diagnosis:  <principal problem not specified> Principal Problem: <principal problem not specified>  Patient Active Problem List   Diagnosis Date Noted  . Acute lower UTI   . Abnormal urinalysis   . Medication side effect   . Neuropathic pain   . History of DVT (deep vein thrombosis)   . Benign essential HTN   . RLS (restless legs syndrome)   . Urinary retention   . Hypokalemia   . Hypoalbuminemia due to protein-calorie malnutrition ([place])   . Acute blood loss anemia   . Lumbar stenosis with neurogenic claudication 11/18/2016  . Spondylolisthesis of lumbar region 11/12/2016  . Hereditary hemochromatosis ([place]) 07/28/2016  . Prothrombin gene mutation ([place]) 05/30/2013  . Right leg DVT ([place]) 05/30/2013  . Hemochromatosis 07/29/2012    Expected Discharge Date: Expected Discharge Date: 12/02/16  Team Members Present: Social Worker Present: Alfonse Alpers, LCSW Nurse Present: Dorthula Nettles, RN PT Present: Michaelene Song, PT OT Present: Clyda Greener, OT SLP Present: Charolett Bumpers, SLP PPS Coordinator present : Daiva Nakayama, RN, CRRN     Current Status/Progress Goal Weekly Team Focus  Medical   Left lower extremity weakness and functional deficits secondary to lumbar stenosis/L4-S1 radiculopathy s/p lumbar decompression and fusion (with re-do)  Improve mobility, urinary retention, myoclonic jerks, neuroapthic pain  See above   Bowel/Bladder   unable to void, I/O cath q6 hrs , Urecholine 50 mg BID, cont. bowel,  LBM 11/26/16  Remain continent of bowel, ability to void with medication return or successfully perform I&O self catherization  Remain continent of bowel, min assist with bladder   Swallow/Nutrition/ Hydration             ADL's   supervision UB selfcare, min assist for LB selfcare sit to stand with use of AE PRN.  Min assist for toilet and shower transfers.  Decreased memory of back precautions  supervision overall  selfcare retraining, balance retraining, neuromuscular re-education, pt/family education DME/AE education   Mobility   supervision to min assist for bed mobility, min assist for gait up to 70 ft with RW, min assist for 4 steps using B handrails, supervision for w/c propulsion   supervision for gait and stairs, Mod I for bed mobility  transfers, gait, LE strengthening, maintaining precautions during all mobility   Communication             Safety/Cognition/ Behavioral Observations  NO REPORTED FALLS OR INJURIES , UP WITH ASSIST X1         Pain   Lidocaine patch daily, 10mg  Oxy IR q 4hr prn, 5mg  Oxy IR q 4hr prn, 50mg  Tramadol q 6hr prn  pain score tolerable 3-4   assess pain q 4hr and prn   Skin   incision to lower back with sutures, CDI  skin to remain free from infection and breakdown while on rehab  assess skin q shift and prn    Rehab Goals Patient on target to meet rehab goals: Yes Rehab Goals Revised: none *  See Care Plan and progress notes for long and short-term goals.     Barriers to Discharge  Current Status/Progress Possible Resolutions Date Resolved   Physician    Medical stability;Wound Care;Other (comments)  left foot drop  See above  Therapies, AFO/PRAFO, I/O cath, urinary retention meds, neuropathic pain meds, d/ced Gabapentin/Lyrica 2/2 ?side effects      Nursing  Neurogenic Bowel & Bladder  Patient may likely have to perform I&O self-cathing at discharge            PT                    OT                  SLP                SW                 Discharge Planning/Teaching Needs:  Pt to return to her home and husband and dtr and other family members to assist.  Family to come over the weekend for education.   Team Discussion:  Pt is doing pretty well as neuropathic pain and jerking has improved.  Pt is still having trouble with urinary retention and has required in and out caths.  Bedtime medications have been resumed.  Pt is continent of bowel.  Pt is supervision with UB self care and LB is min A.  Pt has trouble with carryover and remembering back precautions.  Pt is progressing toward supervision level goals with OT.  PT stated pt is min A with gait 75'.  Pt has trouble with bed mobility and remembering back precautions.  AFO is very difficult to get on.  Therapists would like for company to come assess it.  Revisions to Treatment Plan:  none    Continued Need for Acute Rehabilitation Level of Care: The patient requires daily medical management by a physician with specialized training in physical medicine and rehabilitation for the following conditions: Daily direction of a multidisciplinary physical rehabilitation program to ensure safe treatment while eliciting the highest outcome that is of practical value to the patient.: Yes Daily medical management of patient stability for increased activity during participation in an intensive rehabilitation regime.: Yes Daily analysis of laboratory values and/or radiology reports with any subsequent need for medication adjustment of medical intervention for : Post surgical problems;Neurological problems;Urological problems;Other  Sueko Dimichele, Silvestre Mesi 11/28/2016, 11:55 AM

## 2016-11-28 NOTE — Progress Notes (Signed)
Patient ID: [name], female   DOB: 1941/01/29, 76 y.o.   MRN: [id number] The patient is allergic to penicillins. I'll start doxycycline instead.

## 2016-11-28 NOTE — Progress Notes (Signed)
[facility]     PROGRESS NOTE  Subjective/Complaints:  LLE pain overnite , OT notes lumbar incision open  ROS: Denies CP, SOB, N/V/D.  Objective: Vital Signs: Blood pressure (!) 129/57, pulse 68, temperature 97.6 F (36.4 C), temperature source Oral, resp. rate 18, height 5\' 2"  (1.575 m), weight 84 kg (185 lb 3 oz), SpO2 98 %. No results found.  Recent Labs  11/28/16 0526  WBC 6.5  HGB 8.4*  HCT 26.7*  PLT 294    Recent Labs  11/28/16 0526  NA 136  K 3.5  CL 100*  GLUCOSE 98  BUN 13  CREATININE 0.95  CALCIUM 9.1   CBG (last 3)  No results for input(s): GLUCAP in the last 72 hours.  Wt Readings from Last 3 Encounters:  11/26/16 84 kg (185 lb 3 oz)  11/13/16 90.7 kg (200 lb)  11/03/16 82.9 kg (182 lb 12.2 oz)    Physical Exam:  BP (!) 129/57 (BP Location: Left Arm)   Pulse 68   Temp 97.6 F (36.4 C) (Oral)   Resp 18   Ht 5\' 2"  (1.575 m)   Wt 84 kg (185 lb 3 oz)   SpO2 98%   BMI 33.87 kg/m  Constitutional: She appears well-developedand well-nourished.  HENT: Normocephalicand atraumatic.  Eyes: EOMI. No discharge. Cardiovascular: RRR. No JVD. Respiratory: Effort normal and breath sounds normal. GI: Soft. Bowel sounds are normal.   Musculoskeletal: She exhibits no edemaor tenderness.  Neurological: She is alertand oriented  Speech clear.  Motor: B/l UE 5/5.  RLE: 4/5 HF, 4+/5 KE, 5/5 ADF/PF LLE: HF 4/5, KE 4+/5, ADF/PF 1+/5 (stable).  Skin: Skin is warmand dry. Incision dehisced, serosang drainage midpoint Psychiatric: She has a normal mood and affect. Her behavior is normal. Judgmentand thought contentnormal.   Assessment/Plan: 1. Functional deficits secondary to lumbar stenosis/L4-S1 radiculopathy s/p lumbar decompression and fusion  which require 3+ hours per day of interdisciplinary therapy in a comprehensive inpatient rehab setting. Physiatrist is providing close team supervision and 24 hour management of  active medical problems listed below. Physiatrist and rehab team continue to assess barriers to discharge/monitor patient progress toward functional and medical goals.  Function:  Bathing Bathing position   Position: Shower  Bathing parts Body parts bathed by patient: Right arm, Left arm, Chest, Abdomen, Right upper leg, Left upper leg, Right lower leg, Left lower leg, Front perineal area, Buttocks Body parts bathed by helper: Back  Bathing assist Assist Level: Touching or steadying assistance(Pt > 75%)      Upper Body Dressing/Undressing Upper body dressing   What is the patient wearing?: Pull over shirt/dress, Orthosis, Bra Bra - Perfomed by patient: Thread/unthread right bra strap, Thread/unthread left bra strap, Hook/unhook bra (pull down sports bra)   Pull over shirt/dress - Perfomed by patient: Thread/unthread right sleeve, Thread/unthread left sleeve, Put head through opening, Pull shirt over trunk       Orthosis activity level: Performed by patient  Upper body assist Assist Level: Supervision or verbal cues      Lower Body Dressing/Undressing Lower body dressing   What is the patient wearing?: Underwear, Pants, Shoes, AFO, Socks Underwear - Performed by patient: Thread/unthread right underwear leg, Thread/unthread left underwear leg, Pull underwear up/down Underwear - Performed by helper: Pull underwear up/down Pants- Performed by patient: Thread/unthread right pants leg, Thread/unthread left pants leg, Pull pants up/down Pants- Performed by helper: Pull pants up/down Non-skid slipper socks- Performed by patient: Don/doff right sock, Don/doff  left sock Non-skid slipper socks- Performed by helper: Don/doff right sock, Don/doff left sock Socks - Performed by patient: Don/doff right sock, Don/doff left sock Socks - Performed by helper: Don/doff right sock, Don/doff left sock Shoes - Performed by patient: Don/doff right shoe, Fasten right Shoes - Performed by helper:  Don/doff left shoe, Fasten left   AFO - Performed by helper: Don/doff left AFO      Lower body assist Assist for lower body dressing: Touching or steadying assistance (Pt > 75%)      Toileting Toileting   Toileting steps completed by patient: Adjust clothing prior to toileting, Performs perineal hygiene, Adjust clothing after toileting Toileting steps completed by helper: Adjust clothing prior to toileting, Performs perineal hygiene, Adjust clothing after toileting Toileting Assistive Devices: Grab bar or rail  Toileting assist Assist level: Touching or steadying assistance (Pt.75%)   Transfers Chair/bed transfer   Chair/bed transfer method: Stand pivot Chair/bed transfer assist level: Touching or steadying assistance (Pt > 75%) Chair/bed transfer assistive device: Armrests, Medical sales representative     Max distance: 16' Assist level: Touching or steadying assistance (Pt > 75%)   Wheelchair   Type: Manual Max wheelchair distance: 150' Assist Level: Supervision or verbal cues  Cognition Comprehension Comprehension assist level: Follows basic conversation/direction with no assist, Follows complex conversation/direction with no assist  Expression Expression assist level: Expresses basic needs/ideas: With no assist  Social Interaction Social Interaction assist level: Interacts appropriately with others with medication or extra time (anti-anxiety, antidepressant).  Problem Solving Problem solving assist level: Solves basic 75 - 89% of the time/requires cueing 10 - 24% of the time  Memory Memory assist level: Recognizes or recalls 50 - 74% of the time/requires cueing 25 - 49% of the time    Medical Problem List and Plan: 1. Left lower extremity weakness and functional deficitssecondary to lumbar stenosis/L4-S1 radiculopathy s/p lumbar decompression and fusion (with re-do)  Cont CIR,   Left AFO/PRAFO 2. H/o PE/DVT Prophylaxis/Anticoagulation: Mechanical: Sequential  compression devices, below knee Bilateral lower extremities  Vascular study with chronic DVT in femoral vein  Lovenox  3. Pain Management: Continue oxycodone with flexeril prn.   Gabapentin d/ced due to potential significant increase in tremors, restarted low dose, pt still not able to tolerate, d/ced  Lyrica 50 TID started 7/27 with ?same side effects, d/ced  Lidodermpatch ordered for left foot  Pamelor started 7/28, increased 7/30 to BID  Improving 4. Mood: LCSW to follow for evaluation and support.  5. Neuropsych: This patient iscapable of making decisions on herown behalf. 6. Skin/Wound Care: Monitor wound for healing. NS to eval wound 7. Fluids/Electrolytes/Nutrition: Monitor I/O.  8. HTN: Monitor BP bid. On Norvasc, Inderal and HCTZ  Controlled 8/2  Monitor with increased mobility 9. Hemochromatosis: Stable. Has not had to be phlebotomized in 6 years.  10. Restless leg syndrome: On Mirapex 11. Urinary retention:   D/ced foley  Bladder training.   Bethanechol started 7/27, increased on 7/29, increased on 7/30, increased on 7/31   Cont to monitor 12. Constipation: Increased senna to bid. 13. Hypokalemia  K+ 3.5 on 8/3    Supplemented  Cont to monitor 14. Hypoalbuminemia  Supplement initiated 7/25 15. ABLA   Hb 8.4 on 8/3    Cont to monitor 16. Sleep disturbance  Home Temazepam restarted 7/27 17. Acute lower UTI  Macrobid through today  LOS (Days) 10 A FACE TO FACE EVALUATION WAS PERFORMED  [name] 11/28/2016 8:16 AM

## 2016-11-29 ENCOUNTER — Inpatient Hospital Stay (HOSPITAL_COMMUNITY): Payer: PPO

## 2016-11-29 DIAGNOSIS — G250 Essential tremor: Secondary | ICD-10-CM

## 2016-11-29 MED ORDER — OXYCODONE HCL 5 MG PO TABS
5.0000 mg | ORAL_TABLET | ORAL | Status: DC | PRN
  Administered 2016-11-30: 5 mg via ORAL
  Filled 2016-11-29: qty 1

## 2016-11-29 MED ORDER — PROPRANOLOL HCL 20 MG PO TABS
20.0000 mg | ORAL_TABLET | Freq: Two times a day (BID) | ORAL | Status: DC
  Administered 2016-11-29 – 2016-12-02 (×7): 20 mg via ORAL
  Filled 2016-11-29 (×7): qty 1

## 2016-11-29 NOTE — Progress Notes (Signed)
Occupational Therapy Session Note  Patient Details  Name: [name] MRN: [id number] Date of Birth: Jan 17, 1941  Today's Date: 11/29/2016 OT Individual Time: 1615-1700 OT Individual Time Calculation (min): 45 min    Skilled Therapeutic Interventions/Progress Updates:    1:1. Pt with no c/o pain. Orthosis donned with min A from OT at EOB. Husband present throughout session proving all supervision-CGA for ambulation with OT providing cueing for locking rollator breaks prior to transfers. OT educated/demonstrated for husband on donning shoe with AFO and he was able to return demonstrate with no cues. In ADL apartment pt practices posterior method of walk in shower with husband supervising transfer. Pt requires VC to step over ledge with RLE. Pt ambulates throughout kitchen to obtain various items from shelves/cabinets with VC for how to position walker to open appliances. OT educates on energy conservation strategies in the kitchen while supervising. In tx gym, pt stands on blue foam pad without AD to match playing cards with MIN VC to notice mistakes. No LOB noted. Exited session with pt seated EOB eating meal with husband present.   Therapy Documentation Precautions:  Precautions Precautions: Back, Fall Precaution Booklet Issued: No Precaution Comments: No twisting/bending, no lifting over 5 lbs, no driving for 2 weeks  Required Braces or Orthoses: Spinal Brace Spinal Brace: Lumbar corset, Applied in sitting position Other Brace/Splint: Left AFO Restrictions Weight Bearing Restrictions: No  See Function Navigator for Current Functional Status.   Therapy/Group: Individual Therapy  Tonny Branch 11/29/2016, 5:56 PM

## 2016-11-29 NOTE — Progress Notes (Signed)
[facility]     PROGRESS NOTE  Subjective/Complaints:  Appreciate neurosurgery note Back feels ok this am no stomach upset but thinks her tremor is worse (take propranlol for tremor)  ROS: Denies CP, SOB, N/V/D.  Objective: Vital Signs: Blood pressure (!) 123/44, pulse 70, temperature 98.5 F (36.9 C), temperature source Oral, resp. rate 16, height 5\' 2"  (1.575 m), weight 84 kg (185 lb 3 oz), SpO2 95 %. No results found.  Recent Labs  11/28/16 0526  WBC 6.5  HGB 8.4*  HCT 26.7*  PLT 294    Recent Labs  11/28/16 0526  NA 136  K 3.5  CL 100*  GLUCOSE 98  BUN 13  CREATININE 0.95  CALCIUM 9.1   CBG (last 3)  No results for input(s): GLUCAP in the last 72 hours.  Wt Readings from Last 3 Encounters:  11/26/16 84 kg (185 lb 3 oz)  11/13/16 90.7 kg (200 lb)  11/03/16 82.9 kg (182 lb 12.2 oz)    Physical Exam:  BP (!) 123/44 (BP Location: Left Arm)   Pulse 70   Temp 98.5 F (36.9 C) (Oral)   Resp 16   Ht 5\' 2"  (1.575 m)   Wt 84 kg (185 lb 3 oz)   SpO2 95%   BMI 33.87 kg/m  Constitutional: She appears well-developedand well-nourished.  HENT: Normocephalicand atraumatic.  Eyes: EOMI. No discharge. Cardiovascular: RRR. No JVD. Respiratory: Effort normal and breath sounds normal. GI: Soft. Bowel sounds are normal.   Musculoskeletal: She exhibits no edemaor tenderness.  Neurological: She is alertand oriented  Speech clear.  Motor: B/l UE 5/5.  RLE: 4/5 HF, 4+/5 KE, 5/5 ADF/PF LLE: HF 4/5, KE 4+/5, ADF/PF 1+/5 (stable).  Skin: Skin is warmand dry. Incision dehisced, serosang drainage midpoint Psychiatric: She has a normal mood and affect. Her behavior is normal. Judgmentand thought contentnormal.   Assessment/Plan: 1. Functional deficits secondary to lumbar stenosis/L4-S1 radiculopathy s/p lumbar decompression and fusion  which require 3+ hours per day of interdisciplinary therapy in a comprehensive inpatient rehab  setting. Physiatrist is providing close team supervision and 24 hour management of active medical problems listed below. Physiatrist and rehab team continue to assess barriers to discharge/monitor patient progress toward functional and medical goals.  Function:  Bathing Bathing position   Position: Wheelchair/chair at sink  Bathing parts Body parts bathed by patient: Right arm, Left arm, Chest, Abdomen, Right upper leg, Left upper leg, Right lower leg, Left lower leg, Front perineal area, Buttocks Body parts bathed by helper: Back  Bathing assist Assist Level: Touching or steadying assistance(Pt > 75%)      Upper Body Dressing/Undressing Upper body dressing   What is the patient wearing?: Pull over shirt/dress, Orthosis, Bra Bra - Perfomed by patient: Thread/unthread right bra strap, Thread/unthread left bra strap, Hook/unhook bra (pull down sports bra)   Pull over shirt/dress - Perfomed by patient: Thread/unthread right sleeve, Thread/unthread left sleeve, Put head through opening, Pull shirt over trunk       Orthosis activity level: Performed by patient  Upper body assist Assist Level: Supervision or verbal cues      Lower Body Dressing/Undressing Lower body dressing   What is the patient wearing?: Underwear, Pants, Shoes, AFO, Socks Underwear - Performed by patient: Thread/unthread right underwear leg, Thread/unthread left underwear leg, Pull underwear up/down Underwear - Performed by helper: Pull underwear up/down Pants- Performed by patient: Thread/unthread right pants leg, Thread/unthread left pants leg, Pull pants up/down Pants- Performed by  helper: Pull pants up/down Non-skid slipper socks- Performed by patient: Don/doff right sock, Don/doff left sock Non-skid slipper socks- Performed by helper: Don/doff right sock, Don/doff left sock Socks - Performed by patient: Don/doff right sock, Don/doff left sock Socks - Performed by helper: Don/doff right sock, Don/doff left  sock Shoes - Performed by patient: Don/doff right shoe, Fasten right Shoes - Performed by helper: Don/doff left shoe, Fasten left   AFO - Performed by helper: Don/doff left AFO      Lower body assist Assist for lower body dressing: Supervision or verbal cues      Toileting Toileting   Toileting steps completed by patient: Adjust clothing prior to toileting, Performs perineal hygiene, Adjust clothing after toileting Toileting steps completed by helper: Adjust clothing prior to toileting, Performs perineal hygiene, Adjust clothing after toileting Toileting Assistive Devices: Grab bar or rail  Toileting assist Assist level: Touching or steadying assistance (Pt.75%)   Transfers Chair/bed transfer   Chair/bed transfer method: Stand pivot Chair/bed transfer assist level: Supervision or verbal cues Chair/bed transfer assistive device: Armrests, Medical sales representative     Max distance: 110 Assist level: Supervision or verbal cues   Wheelchair   Type: Manual Max wheelchair distance: 142ft  Assist Level: Supervision or verbal cues  Cognition Comprehension Comprehension assist level: Follows basic conversation/direction with no assist  Expression Expression assist level: Expresses basic needs/ideas: With no assist  Social Interaction Social Interaction assist level: Interacts appropriately with others with medication or extra time (anti-anxiety, antidepressant).  Problem Solving Problem solving assist level: Solves basic 75 - 89% of the time/requires cueing 10 - 24% of the time  Memory Memory assist level: Recognizes or recalls 50 - 74% of the time/requires cueing 25 - 49% of the time    Medical Problem List and Plan: 1. Left lower extremity weakness and functional deficitssecondary to lumbar stenosis/L4-S1 radiculopathy s/p lumbar decompression and fusion (with re-do)  Cont CIR,   Left AFO/PRAFO 2. H/o PE/DVT Prophylaxis/Anticoagulation: Mechanical: Sequential  compression devices, below knee Bilateral lower extremities  Vascular study with chronic DVT in femoral vein  Lovenox  3. Pain Management: Continue oxycodone with flexeril prn.   Gabapentin d/ced due to potential significant increase in tremors, restarted low dose, pt still not able to tolerate, d/ced  Lyrica 50 TID started 7/27 with ?same side effects, d/ced  Lidodermpatch ordered for left foot  Pamelor started 7/28, increased 7/30 to BID  Improving 4. Mood: LCSW to follow for evaluation and support.  5. Neuropsych: This patient iscapable of making decisions on herown behalf. 6. Skin/Wound Care: Monitor wound for healing. Wound dehiscence on doxy per NS 7. Fluids/Electrolytes/Nutrition: Monitor I/O.  8. HTN: Monitor BP bid. On Norvasc, Inderal and HCTZ  Controlled 8/2  Monitor with increased mobility 9. Hemochromatosis: Stable. Has not had to be phlebotomized in 6 years.  10. Restless leg syndrome: On Mirapex 11. Urinary retention:   D/ced foley  Bladder training.   Bethanechol started 7/27, increased on 7/29, increased on 7/30, increased on 7/31   Cont to monitor 12. Constipation: Increased senna to bid. 13. Hypokalemia  K+ 3.5 on 8/3    Supplemented  Cont to monitor 14. Hypoalbuminemia  Supplement initiated 7/25 15. ABLA   Hb 8.4 on 8/3    Cont to monitor 16. Sleep disturbance  Home Temazepam restarted 7/27 17. Acute lower UTI  resolved 18.  Essential tremor cont propranolol may need to increase, BP should tolerate, no bradycardia Vitals:   11/28/16  1954 11/29/16 0542  BP: (!) 121/47 (!) 123/44  Pulse: 77 70  Resp:  16  Temp:  98.5 F (36.9 C)    LOS (Days) 11 A FACE TO FACE EVALUATION WAS PERFORMED  Charlett Blake 11/29/2016 6:47 AM

## 2016-11-29 NOTE — Progress Notes (Signed)
Patient ID: [name], female   DOB: 08/27/1940, 76 y.o.   MRN: [id number] Subjective:  The patient is alert and pleasant. Her husband is at the bedside.  Objective: Vital signs in last 24 hours: Temp:  [98.2 F (36.8 C)-98.5 F (36.9 C)] 98.5 F (36.9 C) (08/04 0542) Pulse Rate:  [70-77] 70 (08/04 0542) Resp:  [16-18] 16 (08/04 0542) BP: (121-129)/(44-47) 123/44 (08/04 0542) SpO2:  [95 %-96 %] 95 % (08/04 0542)  Intake/Output from previous day: 08/03 0701 - 08/04 0700 In: 570 [P.O.:570] Out: 700 [Urine:700] Intake/Output this shift: No intake/output data recorded.  Physical exam the patient is alert and oriented. Her lumbar wound looks better today. She has a superficial dehiscence. There is minimal drainage and erythema.  Lab Results:  Recent Labs  11/28/16 0526  WBC 6.5  HGB 8.4*  HCT 26.7*  PLT 294   BMET  Recent Labs  11/28/16 0526  NA 136  K 3.5  CL 100*  CO2 29  GLUCOSE 98  BUN 13  CREATININE 0.95  CALCIUM 9.1    Studies/Results: No results found.  Assessment/Plan: Lumbar wound dehiscence: The patient is doing well with dressing changes. I have answered the patient's, and her husband's questions. I'll check on her throughout the weekend.  LOS: 11 days     [name] D 11/29/2016, 10:20 AM

## 2016-11-30 ENCOUNTER — Ambulatory Visit (HOSPITAL_COMMUNITY): Payer: PPO

## 2016-11-30 LAB — GLUCOSE, CAPILLARY: GLUCOSE-CAPILLARY: 75 mg/dL (ref 65–99)

## 2016-11-30 MED ORDER — BETHANECHOL CHLORIDE 25 MG PO TABS
25.0000 mg | ORAL_TABLET | Freq: Three times a day (TID) | ORAL | Status: DC
  Administered 2016-11-30 – 2016-12-02 (×6): 25 mg via ORAL
  Filled 2016-11-30 (×6): qty 1

## 2016-11-30 NOTE — Progress Notes (Signed)
Patient ID: [name], female   DOB: 11-23-1940, 76 y.o.   MRN: [id number] Subjective:  The patient is alert and pleasant. She is in no apparent distress.  Objective: Vital signs in last 24 hours: Temp:  [98 F (36.7 C)-98.8 F (37.1 C)] 98 F (36.7 C) (08/05 8657) Pulse Rate:  [52-84] 75 (08/05 0613) Resp:  [18] 18 (08/05 0613) BP: (107-143)/(50-76) 143/51 (08/05 0613) SpO2:  [96 %-99 %] 96 % (08/05 0613)  Intake/Output from previous day: 08/04 0701 - 08/05 0700 In: 582 [P.O.:582] Out: 655 [Urine:655] Intake/Output this shift: Total I/O In: -  Out: 100 [Urine:100]  Physical exam the patient is alert and pleasant. She continues to have a left foot drop. Her lumbar wound is progressively healing. There is minimal discharge on the dressing.  Lab Results:  Recent Labs  11/28/16 0526  WBC 6.5  HGB 8.4*  HCT 26.7*  PLT 294   BMET  Recent Labs  11/28/16 0526  NA 136  K 3.5  CL 100*  CO2 29  GLUCOSE 98  BUN 13  CREATININE 0.95  CALCIUM 9.1    Studies/Results: No results found.  Assessment/Plan: Postop day #12: The patient's wound is progressively healing on Keflex.   LOS: 12 days     [name] D 11/30/2016, 9:06 AM

## 2016-11-30 NOTE — Progress Notes (Signed)
[facility]     PROGRESS NOTE  Subjective/Complaints:  Excess sweating last noc, starting to void with PVR 185ml this am  ROS: Denies CP, SOB, N/V/D.  Objective: Vital Signs: Blood pressure (!) 143/51, pulse 75, temperature 98 F (36.7 C), temperature source Oral, resp. rate 18, height 5\' 2"  (1.575 m), weight 84 kg (185 lb 3 oz), SpO2 96 %. No results found.  Recent Labs  11/28/16 0526  WBC 6.5  HGB 8.4*  HCT 26.7*  PLT 294    Recent Labs  11/28/16 0526  NA 136  K 3.5  CL 100*  GLUCOSE 98  BUN 13  CREATININE 0.95  CALCIUM 9.1   CBG (last 3)   Recent Labs  11/30/16 0130  GLUCAP 75    Wt Readings from Last 3 Encounters:  11/26/16 84 kg (185 lb 3 oz)  11/13/16 90.7 kg (200 lb)  11/03/16 82.9 kg (182 lb 12.2 oz)    Physical Exam:  BP (!) 143/51 (BP Location: Left Arm)   Pulse 75   Temp 98 F (36.7 C) (Oral)   Resp 18   Ht 5\' 2"  (1.575 m)   Wt 84 kg (185 lb 3 oz)   SpO2 96%   BMI 33.87 kg/m  Constitutional: She appears well-developedand well-nourished.  HENT: Normocephalicand atraumatic.  Eyes: EOMI. No discharge. Cardiovascular: RRR. No JVD. Respiratory: Effort normal and breath sounds normal. GI: Soft. Bowel sounds are normal.   Musculoskeletal: She exhibits no edemaor tenderness.  Neurological: She is alertand oriented  Speech clear.  Motor: B/l UE 5/5.  RLE: 4/5 HF, 4+/5 KE, 5/5 ADF/PF LLE: HF 4/5, KE 4+/5, ADF/PF 1+/5 (stable).  Skin: Skin is warmand dry. Incision dehisced, serosang drainage midpoint Psychiatric: She has a normal mood and affect. Her behavior is normal. Judgmentand thought contentnormal.   Assessment/Plan: 1. Functional deficits secondary to lumbar stenosis/L4-S1 radiculopathy s/p lumbar decompression and fusion  which require 3+ hours per day of interdisciplinary therapy in a comprehensive inpatient rehab setting. Physiatrist is providing close team supervision and 24 hour  management of active medical problems listed below. Physiatrist and rehab team continue to assess barriers to discharge/monitor patient progress toward functional and medical goals.  Function:  Bathing Bathing position   Position: Wheelchair/chair at sink  Bathing parts Body parts bathed by patient: Right arm, Left arm, Chest, Abdomen, Right upper leg, Left upper leg, Right lower leg, Left lower leg, Front perineal area, Buttocks Body parts bathed by helper: Back  Bathing assist Assist Level: Touching or steadying assistance(Pt > 75%)      Upper Body Dressing/Undressing Upper body dressing   What is the patient wearing?: Pull over shirt/dress, Orthosis, Bra Bra - Perfomed by patient: Thread/unthread right bra strap, Thread/unthread left bra strap, Hook/unhook bra (pull down sports bra)   Pull over shirt/dress - Perfomed by patient: Thread/unthread right sleeve, Thread/unthread left sleeve, Put head through opening, Pull shirt over trunk       Orthosis activity level: Performed by patient  Upper body assist Assist Level: Supervision or verbal cues      Lower Body Dressing/Undressing Lower body dressing   What is the patient wearing?: Underwear, Pants, Shoes, AFO, Socks Underwear - Performed by patient: Thread/unthread right underwear leg, Thread/unthread left underwear leg, Pull underwear up/down Underwear - Performed by helper: Pull underwear up/down Pants- Performed by patient: Thread/unthread right pants leg, Thread/unthread left pants leg, Pull pants up/down Pants- Performed by helper: Pull pants up/down Non-skid slipper socks- Performed  by patient: Don/doff right sock, Don/doff left sock Non-skid slipper socks- Performed by helper: Don/doff right sock, Don/doff left sock Socks - Performed by patient: Don/doff right sock, Don/doff left sock Socks - Performed by helper: Don/doff right sock, Don/doff left sock Shoes - Performed by patient: Don/doff right shoe, Fasten  right Shoes - Performed by helper: Don/doff left shoe, Fasten left   AFO - Performed by helper: Don/doff left AFO      Lower body assist Assist for lower body dressing: Supervision or verbal cues      Toileting Toileting   Toileting steps completed by patient: Adjust clothing prior to toileting, Performs perineal hygiene, Adjust clothing after toileting Toileting steps completed by helper: Adjust clothing prior to toileting, Performs perineal hygiene, Adjust clothing after toileting Toileting Assistive Devices: Grab bar or rail  Toileting assist Assist level: Touching or steadying assistance (Pt.75%)   Transfers Chair/bed transfer   Chair/bed transfer method: Stand pivot Chair/bed transfer assist level: Supervision or verbal cues Chair/bed transfer assistive device: Armrests, Medical sales representative     Max distance: 110 Assist level: Supervision or verbal cues   Wheelchair   Type: Manual Max wheelchair distance: 140ft  Assist Level: Supervision or verbal cues  Cognition Comprehension Comprehension assist level: Follows basic conversation/direction with no assist  Expression Expression assist level: Expresses basic needs/ideas: With no assist  Social Interaction Social Interaction assist level: Interacts appropriately with others with medication or extra time (anti-anxiety, antidepressant).  Problem Solving Problem solving assist level: Solves basic 75 - 89% of the time/requires cueing 10 - 24% of the time  Memory Memory assist level: Recognizes or recalls 50 - 74% of the time/requires cueing 25 - 49% of the time    Medical Problem List and Plan: 1. Left lower extremity weakness and functional deficitssecondary to lumbar stenosis/L4-S1 radiculopathy s/p lumbar decompression and fusion (with re-do)  Cont CIR, PT, OT  Left AFO/PRAFO 2. H/o PE/DVT Prophylaxis/Anticoagulation: Mechanical: Sequential compression devices, below knee Bilateral lower  extremities  Vascular study with chronic DVT in femoral vein  Lovenox  3. Pain Management: Continue oxycodone with flexeril prn.   Gabapentin d/ced due to potential significant increase in tremors, restarted low dose, pt still not able to tolerate, d/ced  Lyrica 50 TID started 7/27 with ?same side effects, d/ced  Lidodermpatch ordered for left foot  Pamelor started 7/28, increased 7/30 to BID- this may cause urinary retention will d/c  4. Mood: LCSW to follow for evaluation and support.  5. Neuropsych: This patient iscapable of making decisions on herown behalf. 6. Skin/Wound Care: Monitor wound for healing. Wound dehiscence on doxy per NS started 8/3 7. Fluids/Electrolytes/Nutrition: Monitor I/O.  8. HTN: Monitor BP bid. On Norvasc, Inderal and HCTZ  Controlled 8/2  Monitor with increased mobility 9. Hemochromatosis: Stable. Has not had to be phlebotomized in 6 years.  10. Restless leg syndrome: On Mirapex 11. Urinary retention:   D/ced foley  Bladder training.   Bethanechol started 7/27, increased on 7/29, increased on 7/30, increased on 7/31- starting to void but has excessive sweating, reduce to 25mg    Cont to monitor 12. Constipation: Increased senna to bid. 13. Hypokalemia  K+ 3.5 on 8/3    Supplemented  Cont to monitor 14. Hypoalbuminemia  Supplement initiated 7/25 15. ABLA   Hb 8.4 on 8/3    Cont to monitor 16. Sleep disturbance  Home Temazepam restarted 7/27 17. Acute lower UTI  resolved 18.  Essential tremor cont propranolol may need  to increase, BP should tolerate, no bradycardia Vitals:   11/29/16 1948 11/30/16 0613  BP: 140/61 (!) 143/51  Pulse: 84 75  Resp:  18  Temp:  98 F (36.7 C)    LOS (Days) 12 A FACE TO FACE EVALUATION WAS PERFORMED  KIRSTEINS,ANDREW E 11/30/2016 8:15 AM

## 2016-11-30 NOTE — Progress Notes (Signed)
Physical Therapy Session Note  Patient Details  Name: [name] MRN: [id number]  Date of Birth: 1940-07-09  Today's Date: 11/30/2016 PT Individual Time: 0900-1000 PT Individual Time Calculation (min): 60 min   Short Term Goals: Week 2:  PT Short Term Goal 1 (Week 2): STG=LTG secondary to ELOS  Skilled Therapeutic Interventions/Progress Updates:    Pt seated in w/c upon PT arrival, agreeable to therapy tx and husband present for entire session for family training. Instructed husband on donning/doffing L AFO. Pt ambulated x 57ft to the rehab apartment with supervision. Pt performed bed mobility with min assist to help lift L LE from sitting to supine. Pt supine<>sitting EOB with supervision. Discussed modifications for home including using a step after pt is seated on bed in order to help with posterior scooting in bed prior to laying down, husband stated they already have a step like this. Discussed with both pt and husband the importance of maintaining precautions and using log roll technique to get in and out of bed. Pt ascended/deascended 4 steps x 2 (once with PT and once with husband), using a single handrail and min assist while simulating home environment- verbal cues needed to remind up with the R LE and down with the L LE. Pt transferred from w/c to mat, stand step using RW and supervision. Reviewed home exercises to include: hip abduction in supine and balance exercises standing at RW without UE support. Pt left seated in w/c with needs in reach.   Therapy Documentation Precautions:  Precautions Precautions: Back, Fall Precaution Booklet Issued: No Precaution Comments: No twisting/bending, no lifting over 5 lbs, no driving for 2 weeks  Required Braces or Orthoses: Spinal Brace Spinal Brace: Lumbar corset, Applied in sitting position Other Brace/Splint: Left AFO Restrictions Weight Bearing Restrictions: No Pain: Pt denies pain this session   See Function Navigator for Current  Functional Status.   Therapy/Group: Individual Therapy  [name], PT, DPT 11/30/2016, 9:04 AM

## 2016-12-01 ENCOUNTER — Inpatient Hospital Stay (HOSPITAL_COMMUNITY): Payer: PPO

## 2016-12-01 ENCOUNTER — Inpatient Hospital Stay (HOSPITAL_COMMUNITY): Payer: PPO | Admitting: Occupational Therapy

## 2016-12-01 MED ORDER — OXYCODONE HCL 5 MG PO TABS
5.0000 mg | ORAL_TABLET | Freq: Three times a day (TID) | ORAL | Status: DC | PRN
  Administered 2016-12-01 (×2): 5 mg via ORAL
  Filled 2016-12-01 (×2): qty 1

## 2016-12-01 MED ORDER — OXYCODONE HCL 5 MG PO TABS: 5.0000 mg | ORAL_TABLET | Freq: Four times a day (QID) | ORAL | Status: DC | PRN

## 2016-12-01 NOTE — Progress Notes (Signed)
Social Work Patient ID: [name] Section, female   DOB: 02/24/1941, 76 y.o.   MRN: [id number]  Met with pt to discuss home health and any preference of agencies. She had one years ago but has no preference. She is in agreement To use [facility] for her follow up therapies. Wants them to contact her on her cell phone since home phone can't get to fast enough. Have made referral to [facility] for therapies and gave them her cell phone. Pt feels ready to go home Tomorrow. Husband was here over the weekend to go through therapies with pt.

## 2016-12-01 NOTE — Discharge Summary (Signed)
Physician Discharge Summary  Patient ID: [name] MRN: [id number] DOB/AGE: 09-19-1940 76 y.o.  Admit date: 11/18/2016 Discharge date: 12/02/2016  Discharge Diagnoses:  Principal Problem:   Lumbar stenosis with neurogenic claudication Active Problems:   History of DVT (deep vein thrombosis)   Benign essential HTN   RLS (restless legs syndrome)   Urinary retention   Hypokalemia   Hypoalbuminemia due to protein-calorie malnutrition (HCC)   Acute blood loss anemia   Neuropathic pain   E. coli UTI   Postoperative wound dehiscence   Discharged Condition: stable   Significant Diagnostic Studies: N/A   Labs:  Basic Metabolic panel: BMP Latest Ref Rng & Units 12/02/2016 11/28/2016 11/25/2016  Glucose 65 - 99 mg/dL 89 98 90  BUN 6 - 20 mg/dL 17 13 12   Creatinine 0.44 - 1.00 mg/dL 0.89 0.95 0.93  Sodium 135 - 145 mmol/L 136 136 135  Potassium 3.5 - 5.1 mmol/L 3.4(L) 3.5 3.6  Chloride 101 - 111 mmol/L 99(L) 100(L) 96(L)  CO2 22 - 32 mmol/L 29 29 31   Calcium 8.9 - 10.3 mg/dL 8.9 9.1 8.8(L)    CBC: CBC Latest Ref Rng & Units 12/02/2016 11/28/2016 11/25/2016  WBC 4.0 - 10.5 K/uL 5.4 6.5 5.5  Hemoglobin 12.0 - 15.0 g/dL 8.6(L) 8.4(L) 8.4(L)  Hematocrit 36.0 - 46.0 % 27.3(L) 26.7(L) 26.9(L)  Platelets 150 - 400 K/uL 288 294 267    CBG:  Recent Labs Lab 11/30/16 0130  GLUCAP 75    Brief HPI:   [name] is a 76 y.o. female with history of HTN, PE/DVT, multiple back surgeries who developed recurrent pain radiating to RLE due to L4/5 and L5-S1 spondylolisthesis with recurrent stenosis L4, L5 and S1 nerve roots causing neurogenic claudication. She elected to undergo redo L4/5 and L5-S1 decompressive laminectomy with nerve root decompression on 11/12/16 by Dr. Arnoldo Morale. Post op with LLE numbness with EHL weakness due to displacement of L5 pedicle screw. She was taken back to OR on 7/19 for revision of lumbar instrumentation and AFO ordered for Left foot drop. Neuropathy improved with  addition of neurontin. She has had issues with urinary retention with volumes from 600 cc- 1000 cc and foley replaced.  Therapy ongoing and patient with significant deficits in mobility and CIR recommended by rehab team.    Hospital Course: [name] was admitted to rehab 11/18/2016 for inpatient therapies to consist of PT and OT at least three hours five days a week. Past admission physiatrist, therapy team and rehab RN have worked together to provide customized collaborative inpatient rehab. Blood pressures were monitored on bid basis and have been reasonably controlled.  She has had issues with neuropathy LLE but  was unable to tolerate Gabapentin, Lyrica or Pamelor due to side effects. BLE dopplers done revealing chronic DVT in femoral vein and she was maintained on Lovenox for DVT prophylaxis. Foley was discontinued and she was started on bladder program. She was found to have E coli UTI and was treated with 10 day course of macrodantin. She required I/O caths due to retention and was started on urecholine. This was titrated upwards with good results. She had recurrent issues with retention with attempts at taper of urecholine therefore this was increased back to 25 mg tid.  Blood pressures have been labile but controlled overall. Mild hypokalemia noted on check of lytes and potassium supplement was increased to bid. ABLA has been monitored and has been stable overall. No signs of bleeding noted.   Her  wound dehiscenced on 8/3 and NS was consulted to evaluate wound. She was placed on doxycyline for wound prophylaxis and is to continue this for a week post discharge. She has minimal serous drainage from wound and husband instructed to change dressings 1-2 times a day as needed.  Protein supplements was added to help promote wound healing but she has refused this frequently.  She has had improvement in functional use of BLE as well as improvement in bladder function. She is currently voiding without  retention and has been educated to toilet every 4 hours and double void. Neuropathy LLE has improved and she was fitted with L-AFO due to foot drop. She has made steady progress and is currently at supervision level. She will continue to receive follow up [place] [place] [facility] [place] [facility] after discharge.    Rehab course: During patient's stay in rehab weekly team conferences were held to monitor patient's progress, set goals and discuss barriers to discharge. At admission patient required  She has had improvement in activity tolerance, balance, postural control, as well as ability to compensate for deficits.  She is able to complete ADL tasks with supervision. She requires min to mod cues to maintain back precautions with IADLs. She requires supervision for transfers and to ambulate 150' with RW. Family education was completed with husband regarding all aspects of mobility and care.    Disposition: 01-Home or Self Care  Diet: Regular.   Wound Care: Cleanse with normal saline. Paint edges with betadine. Cover with dry dressing and change dressing twice a day.  Contact MD if you develop any new problems with your incision/wound--redness, swelling, increase in pain, increase in drainage or if you develop fever or chills.   Special Instructions: 1. Toilet every 4 hours and double void each time to fully empty the bladder. Contact MD if you start having difficulty voiding.  2. No bending, twisting or arching. No lifting items over 5 lbs.  3. Needs BMET and CBC rechecked in 1-2 weeks.   Discharge Instructions    Ambulatory referral to Physical Medicine Rehab    Complete by:  As directed    1-2 weeks transitional care appt     Allergies as of 12/02/2016      Reactions   Penicillins Anaphylaxis   Pt was 76 or 76 years old  Has patient had a PCN reaction causing immediate rash, facial/tongue/throat swelling, SOB or lightheadedness with hypotension: Yes Has patient had a PCN reaction causing  severe rash involving mucus membranes or skin necrosis: Unknown Has patient had a PCN reaction that required hospitalization: No Has patient had a PCN reaction occurring within the last 10 years: No If all of the above answers are "NO", then may proceed with Cephalosporin use.   Sulfa Antibiotics Other (See Comments)   unknown   Morphine And Related Other (See Comments)   Twitching in large doses       Medication List    STOP taking these medications   HYDROcodone-acetaminophen 5-325 MG tablet Commonly known as:  NORCO/VICODIN   oxyCODONE-acetaminophen 5-325 MG tablet Commonly known as:  PERCOCET/ROXICET   vitamin E 400 UNIT capsule     TAKE these medications   amLODipine 5 MG tablet Commonly known as:  NORVASC Take 5 mg by mouth daily.   ARTIFICIAL TEARS OP Place 1 drop into both eyes 3 (three) times daily as needed (dry eyes).   aspirin EC 81 MG tablet Take 162 mg by mouth daily.  bethanechol 25 MG tablet Commonly known as:  URECHOLINE Take 1 tablet (25 mg total) by mouth 3 (three) times daily.   Biotin 5000 MCG Tabs Take 5,000 mcg by mouth daily.   CALCIUM 600+D PO Take 1 tablet by mouth 2 (two) times daily.   citalopram 20 MG tablet Commonly known as:  CELEXA Take 20 mg by mouth at bedtime.   colchicine 0.6 MG tablet Take 1 tablet (0.6 mg total) by mouth daily. Gout flare up. What changed:  when to take this  reasons to take this   cyclobenzaprine 10 MG tablet Commonly known as:  FLEXERIL Take 0.5 tablets (5 mg total) by mouth 2 (two) times daily as needed for muscle spasms. What changed:  how much to take   doxycycline 100 MG tablet Commonly known as:  VIBRA-TABS Take 1 tablet (100 mg total) by mouth every 12 (twelve) hours.   fish oil-omega-3 fatty acids 1000 MG capsule Take 1 g by mouth daily.   hydrochlorothiazide 25 MG tablet Commonly known as:  HYDRODIURIL Take 25 mg by mouth daily.   lidocaine 5 % Commonly known as:  LIDODERM Place  1 patch onto the skin daily. Apply to back at 8 am and remove at 8 pm daily (has to be off for 12 hours daily) Can be purchased over the counter.   multivitamin with minerals Tabs tablet Take 1 tablet by mouth daily.   pantoprazole 40 MG tablet Commonly known as:  PROTONIX Take 40 mg by mouth daily before supper.   potassium chloride SA 20 MEQ tablet Commonly known as:  K-DUR,KLOR-CON Take 1 tablet (20 mEq total) by mouth 2 (two) times daily.   pramipexole 0.5 MG tablet Commonly known as:  MIRAPEX Take 0.5 mg by mouth every evening.   propranolol 20 MG tablet Commonly known as:  INDERAL Take 1 tablet (20 mg total) by mouth 2 (two) times daily. What changed:  medication strength  how much to take   REMIFEMIN PO Take 1 tablet by mouth 2 (two) times daily.   RESTASIS 0.05 % ophthalmic emulsion Generic drug:  cycloSPORINE Place 1 drop into both eyes 2 (two) times daily.   senna-docusate 8.6-50 MG tablet Commonly known as:  Senokot-S Take 2 tablets by mouth at bedtime.   temazepam 15 MG capsule Commonly known as:  RESTORIL Take 1 capsule (15 mg total) by mouth at bedtime.   traMADol 50 MG tablet--Rx # 75 pills Commonly known as:  ULTRAM Take 1 tablet (50 mg total) by mouth every 6 (six) hours as needed for moderate pain.   Turmeric Curcumin 500 MG Caps Take 500 mg by mouth at bedtime.   Vitamin D 2000 units tablet Take 2,000 Units by mouth 2 (two) times daily.      Follow-up Information    Shirline Frees, MD Follow up on 12/10/2016.   Specialty:  Family Medicine Why:  Appointment @ 2:45 PM Contact information: [address] [place] [address] [phone]        Jamse Arn, MD Follow up.   Specialty:  Physical Medicine and Rehabilitation Why:  Office will call you with follow up appointment Contact information: [address] [place] [address] [phone]        Newman Pies, MD. Call in 1 day(s).    Specialty:  Neurosurgery Why:  for follow up appointment in one week Contact information: [address] [place] [address] [place] [address] [phone]  SignedBary Leriche 12/05/2016, 6:12 PM

## 2016-12-01 NOTE — Progress Notes (Signed)
Occupational Therapy Discharge Summary  Patient Details  Name: [name] MRN: [id number] Date of Birth: Jul 29, 1940  Today's Date: 12/01/2016 OT Individual Time: 3005-1102 OT Individual Time Calculation (min): 72 min    Session Note:  Pt completed toilet transfer with supervision to start session, with use of the RW for support.  She then ambulated to the ADL apartment with supervision as well.  Practiced simulated walk-in shower transfers and bed transfers, both with supervision.  Had her work on simple meal prep of scrambling an egg using her RW to gather and transport items around the kitchen.  She was able to use the countertops with min to mod instructional cueing and gather items to complete task.  She remained standing the entire time, resting only before placing dishes into the dishwasher.  She continues to need mod instructional cueing at times to avoid bending at her lower back and instead bend her knees.  Finished session with return to room via walker and transfer into the bed with supervision.  She was able to remove her shoes and AFO adhering to back precautions as well.  Call button and phone in reach.    Patient has met 7 of 7 long term goals due to improved balance, ability to compensate for deficits and improved awareness.  Patient to discharge at overall Supervision level.  Patient's care partner is independent to provide the necessary physical and cognitive assistance at discharge.    Reasons goals not met: NA  Recommendation:  Patient will benefit from ongoing skilled OT services in home health setting to continue to advance functional skills in the area of BADL, iADL and Reduce care partner burden.  Equipment: 3:1  Reasons for discharge: treatment goals met and discharge from hospital  Patient/family agrees with progress made and goals achieved: Yes  OT Discharge Precautions/Restrictions  Precautions Precautions: Back;Fall Precaution Booklet Issued:  No Precaution Comments: No twisting/bending, no lifting over 5 lbs, no driving for 2 weeks  Spinal Brace: Lumbar corset;Applied in sitting position Other Brace/Splint: Left AFO  Pain Pain Assessment Pain Assessment: Faces Pain Score: 1  Faces Pain Scale: Hurts a little bit Pain Type: Surgical pain Pain Location: Leg Pain Orientation: Left Pain Descriptors / Indicators: Discomfort Pain Onset: With Activity Pain Intervention(s): Repositioned ADL  See Function Section of chart for details  Vision Baseline Vision/History: Wears glasses Wears Glasses: At all times Patient Visual Report: No change from baseline Vision Assessment?: No apparent visual deficits Perception  Perception: Within Functional Limits Praxis Praxis: Intact Cognition Overall Cognitive Status: History of cognitive impairments - at baseline Arousal/Alertness: Awake/alert Orientation Level: Oriented X4 Attention: Focused;Sustained Focused Attention: Appears intact Sustained Attention: Appears intact Memory: Impaired Memory Impairment: Storage deficit;Decreased recall of new information Awareness: Impaired Awareness Impairment: Anticipatory impairment Executive Function: Self Correcting Self Correcting: Impaired Safety/Judgment: Impaired Comments: Pt still needs cueing for hand placement with sit to stand at times as well as for correct use/positoning of RW with ADL and home management tasks.  Sensation Sensation Light Touch: Appears Intact Stereognosis: Appears Intact Hot/Cold: Appears Intact Proprioception: Appears Intact Additional Comments: sensation intact in BUEs Coordination Gross Motor Movements are Fluid and Coordinated: Yes Fine Motor Movements are Fluid and Coordinated: Yes Coordination and Movement Description: Coordination WFLS in BUEs Motor  Motor Motor - Discharge Observations: Left foot drop still prevalent Mobility  Transfers Transfers: Sit to Stand;Stand to Sit Sit to Stand: 5:  Supervision;With upper extremity assist;From chair/3-in-1;With armrests Stand to Sit: 5: Supervision;With upper extremity assist;With armrests;To chair/3-in-1  Trunk/Postural Assessment  Cervical Assessment Cervical Assessment: Within Functional Limits Thoracic Assessment Thoracic Assessment: Exceptions to [facility] (Thoracic rounding) Lumbar Assessment Lumbar Assessment: Exceptions to [facility] (lumbar corsett with slight lumbar flexion noted in standing) Postural Control Postural Control: Within Functional Limits  Balance Balance Balance Assessed: Yes Static Sitting Balance Static Sitting - Balance Support: No upper extremity supported Static Sitting - Level of Assistance: 6: Modified independent (Device/Increase time) Dynamic Sitting Balance Dynamic Sitting - Balance Support: No upper extremity supported Dynamic Sitting - Level of Assistance: 6: Modified independent (Device/Increase time) Static Standing Balance Static Standing - Balance Support: During functional activity Static Standing - Level of Assistance: 5: Stand by assistance Dynamic Standing Balance Dynamic Standing - Balance Support: Bilateral upper extremity supported;During functional activity Dynamic Standing - Level of Assistance: 5: Stand by assistance Extremity/Trunk Assessment RUE Assessment RUE Assessment: Exceptions to [facility] (AROM shoulder flexion 0- 100 degrees, pt with history of rotator cuff repair.  All other joints AROM WFLS for functional use.) LUE Assessment LUE Assessment: Within Functional Limits (AROM WFLS with strength 4/5 throughout based on functional use.  Strength not formally assessed secondary to precautions.)   See Function Navigator for Current Functional Status.  , OTR/L 12/01/2016, 4:14 PM

## 2016-12-01 NOTE — Progress Notes (Signed)
Physical Therapy Discharge Summary  Patient Details  Name: [name] MRN: [id number] Date of Birth: 1940-07-03  Today's Date: 12/01/2016 PT Individual Time: 1100-1158 PT Individual Time Calculation (min): 58 min    Patient has met 9 of 9 long term goals due to improved activity tolerance, improved balance, increased strength, decreased pain and ability to compensate for deficits.  Patient to discharge at a wheelchair level for community distances and ambulatory for household distances Supervision.   Patient's care partner is independent to provide the necessary physical assistance at discharge.Pt with difficulty remembering precautions and techniques, supervision and verbal cues recommended from caregiver in order to ensure safety with all functional mobility.   All goals met.   Recommendation:  Patient will benefit from ongoing skilled PT services in home health setting to continue to advance safe functional mobility, address ongoing impairments in LE strength, functional mobility, endurance and minimize fall risk.  Equipment: RW for household ambulation and 20 x 18 w/c for community distances  Reasons for discharge: treatment goals met  Patient/family agrees with progress made and goals achieved: Yes    Interventions: Pt seated in w/c upon PT arrival, agreeable to therapy tx and denies pain. Pt propelled w/c 150 ft using B LEs to the gym. Session focused on functional mobility in preparation for d/c home. Pt performed a car transfer using RW, ambulated on uneven surfaces and navigated curb using RW, ambulated x 150 ft using RW and ascended descended 8 steps using single handrail all with supervision.  Pt with difficulty remembering which leg to lead with for stair navigation, PT provided handout for pt and family with instructions to ascend stairs leading with stronger leg and descend leading with weaker. Pt performed all bed mobility in rehab apartment with supervision and verbal cues  for precautions. Reviewed patients lumbar precautions and home exercise program and provided handouts. Pt propelled w/c back to room with supervision. Pt transferred from w/c to recliner with supervision using RW. Pt left seated in recliner with needs in reach.   PT Discharge Precautions/Restrictions Precautions Precautions: Back;Fall Precaution Booklet Issued: No Precaution Comments: No twisting/bending, no lifting over 5 lbs, no driving for 2 weeks  Required Braces or Orthoses: Spinal Brace Spinal Brace: Lumbar corset;Applied in sitting position Other Brace/Splint: Left AFO Restrictions Weight Bearing Restrictions: No Pain Pain Assessment Pain Assessment: 0-10 Pain Score: 1  Pain Type: Surgical pain Pain Location: Foot Pain Orientation: Left Pain Descriptors / Indicators: Discomfort Pain Onset: On-going Pain Intervention(s): Medication (See eMAR);Repositioned;Emotional support Cognition Overall Cognitive Status: Within Functional Limits for tasks assessed Arousal/Alertness: Awake/alert Orientation Level: Oriented X4 Safety/Judgment: Appears intact Sensation Sensation Light Touch: Impaired by gross assessment (decreased sensation on the top of the foot) Stereognosis: Appears Intact Hot/Cold: Appears Intact Proprioception: Appears Intact Coordination Gross Motor Movements are Fluid and Coordinated: Yes (impaired on L LE, WNL R LE) Fine Motor Movements are Fluid and Coordinated: Yes Motor  Motor Motor: Other (comment) (L foot drop) Motor - Skilled Clinical Observations: L foot drop and trendelenberg, decreased strength L LE>R LE  Trunk/Postural Assessment  Cervical Assessment Cervical Assessment: Within Functional Limits Thoracic Assessment Thoracic Assessment: Within Functional Limits Lumbar Assessment Lumbar Assessment: Exceptions to [facility] (lumbar corset) Postural Control Postural Control: Within Functional Limits  Balance Balance Balance Assessed: Yes Static  Sitting Balance Static Sitting - Level of Assistance: 6: Modified independent (Device/Increase time) Dynamic Sitting Balance Dynamic Sitting - Level of Assistance: 6: Modified independent (Device/Increase time) Static Standing Balance Static Standing - Level of  Assistance: 5: Stand by assistance Dynamic Standing Balance Dynamic Standing - Level of Assistance: 5: Stand by assistance Extremity Assessment  RLE Assessment RLE Assessment: Exceptions to [facility] (Grossly 4/5 throughout) LLE Assessment LLE Assessment: Exceptions to [facility] LLE Strength Left Hip Flexion: 3+/5 Left Hip Extension: 3/5 Left Hip ABduction: 2/5 Left Hip ADduction: 3+/5 Left Knee Flexion: 2+/5 Left Knee Extension: 3/5 Left Ankle Dorsiflexion: 1/5 Left Ankle Plantar Flexion: 3-/5   See Function Navigator for Current Functional Status.  [name], PT, DPT 12/01/2016, 11:23 AM

## 2016-12-01 NOTE — Progress Notes (Signed)
Patient ID: [name], female   DOB: Nov 23, 1940, 76 y.o.   MRN: [id number] Subjective:  The patient is alert and pleasant. She looks well. She is in no apparent distress.  Objective: Vital signs in last 24 hours: Temp:  [98.1 F (36.7 C)] 98.1 F (36.7 C) (08/06 0534) Pulse Rate:  [72-79] 72 (08/06 0534) Resp:  [18] 18 (08/06 0534) BP: (122-156)/(51-70) 141/62 (08/06 0534) SpO2:  [98 %-100 %] 100 % (08/06 0534)  Intake/Output from previous day: 08/05 0701 - 08/06 0700 In: 360 [P.O.:360] Out: 1900 [Urine:1900] Intake/Output this shift: Total I/O In: 300 [P.O.:300] Out: 475 [Urine:475]  Physical exam the patient is alert and pleasant. Her wound continues to have a superficial dehiscence with some exudate. She continues to have a left foot drop.  Lab Results: No results for input(s): WBC, HGB, HCT, PLT in the last 72 hours. BMET No results for input(s): NA, K, CL, CO2, GLUCOSE, BUN, CREATININE, CALCIUM in the last 72 hours.  Studies/Results: No results found.  Assessment/Plan: Postop day #13. We will plan to continue with dressing changes and Keflex for a week after discharge. I'll see her in the office in a week.  LOS: 13 days     [name] 12/01/2016, 12:40 PM

## 2016-12-01 NOTE — Progress Notes (Signed)
[facility]     PROGRESS NOTE  Subjective/Complaints:  Pt seen sitting up in bed this AM.  She slept well overnight.   ROS: Denies CP, SOB, N/V/D.  Objective: Vital Signs: Blood pressure (!) 141/62, pulse 72, temperature 98.1 F (36.7 C), temperature source Oral, resp. rate 18, height 5\' 2"  (1.575 m), weight 84 kg (185 lb 3 oz), SpO2 100 %. No results found. No results for input(s): WBC, HGB, HCT, PLT in the last 72 hours. No results for input(s): NA, K, CL, GLUCOSE, BUN, CREATININE, CALCIUM in the last 72 hours.  Invalid input(s): CO CBG (last 3)   Recent Labs  11/30/16 0130  GLUCAP 75    Wt Readings from Last 3 Encounters:  11/26/16 84 kg (185 lb 3 oz)  11/13/16 90.7 kg (200 lb)  11/03/16 82.9 kg (182 lb 12.2 oz)    Physical Exam:  BP (!) 141/62 (BP Location: Left Arm)   Pulse 72   Temp 98.1 F (36.7 C) (Oral)   Resp 18   Ht 5\' 2"  (1.575 m)   Wt 84 kg (185 lb 3 oz)   SpO2 100%   BMI 33.87 kg/m  Constitutional: She appears well-developedand well-nourished.  HENT: Normocephalicand atraumatic.  Eyes: EOMI. No discharge. Cardiovascular: RRR. No JVD. Respiratory: Effort normal and breath sounds normal. GI: Soft. Bowel sounds are normal.   Musculoskeletal: She exhibits no edemaor tenderness.  Neurological: She is alertand oriented  Speech clear.  Motor: B/l UE 5/5.  RLE: 4/5 HF, 4+/5 KE, 5/5 ADF/PF LLE: HF 4/5, KE 4+/5, ADF/PF 1+/5 (unchanged).  Skin: Skin is warmand dry. Incision dehisced, serosang drainage midpoint Psychiatric: She has a normal mood and affect. Her behavior is normal. Judgmentand thought contentnormal.   Assessment/Plan: 1. Functional deficits secondary to lumbar stenosis/L4-S1 radiculopathy s/p lumbar decompression and fusion  which require 3+ hours per day of interdisciplinary therapy in a comprehensive inpatient rehab setting. Physiatrist is providing close team supervision and 24 hour management of  active medical problems listed below. Physiatrist and rehab team continue to assess barriers to discharge/monitor patient progress toward functional and medical goals.  Function:  Bathing Bathing position   Position: Wheelchair/chair at sink  Bathing parts Body parts bathed by patient: Right arm, Left arm, Chest, Abdomen, Right upper leg, Left upper leg, Right lower leg, Left lower leg, Front perineal area, Buttocks Body parts bathed by helper: Back  Bathing assist Assist Level: Touching or steadying assistance(Pt > 75%)      Upper Body Dressing/Undressing Upper body dressing   What is the patient wearing?: Pull over shirt/dress, Orthosis, Bra Bra - Perfomed by patient: Thread/unthread right bra strap, Thread/unthread left bra strap, Hook/unhook bra (pull down sports bra)   Pull over shirt/dress - Perfomed by patient: Thread/unthread right sleeve, Thread/unthread left sleeve, Put head through opening, Pull shirt over trunk       Orthosis activity level: Performed by patient  Upper body assist Assist Level: Supervision or verbal cues      Lower Body Dressing/Undressing Lower body dressing   What is the patient wearing?: Underwear, Pants, Shoes, AFO, Socks Underwear - Performed by patient: Thread/unthread right underwear leg, Thread/unthread left underwear leg, Pull underwear up/down Underwear - Performed by helper: Pull underwear up/down Pants- Performed by patient: Thread/unthread right pants leg, Thread/unthread left pants leg, Pull pants up/down Pants- Performed by helper: Pull pants up/down Non-skid slipper socks- Performed by patient: Don/doff right sock, Don/doff left sock Non-skid slipper socks- Performed by helper:  Don/doff right sock, Don/doff left sock Socks - Performed by patient: Don/doff right sock, Don/doff left sock Socks - Performed by helper: Don/doff right sock, Don/doff left sock Shoes - Performed by patient: Don/doff right shoe, Fasten right Shoes - Performed  by helper: Don/doff left shoe, Fasten left   AFO - Performed by helper: Don/doff left AFO      Lower body assist Assist for lower body dressing: Supervision or verbal cues      Toileting Toileting   Toileting steps completed by patient: Adjust clothing prior to toileting, Performs perineal hygiene, Adjust clothing after toileting Toileting steps completed by helper: Adjust clothing prior to toileting, Performs perineal hygiene, Adjust clothing after toileting Toileting Assistive Devices: Grab bar or rail  Toileting assist Assist level: Touching or steadying assistance (Pt.75%)   Transfers Chair/bed transfer   Chair/bed transfer method: Stand pivot Chair/bed transfer assist level: Supervision or verbal cues Chair/bed transfer assistive device: Armrests, Medical sales representative     Max distance: 80 ft Assist level: Supervision or verbal cues   Wheelchair   Type: Manual Max wheelchair distance: 120ft  Assist Level: Supervision or verbal cues  Cognition Comprehension Comprehension assist level: Follows basic conversation/direction with no assist  Expression Expression assist level: Expresses basic needs/ideas: With no assist  Social Interaction Social Interaction assist level: Interacts appropriately with others with medication or extra time (anti-anxiety, antidepressant).  Problem Solving Problem solving assist level: Solves basic 75 - 89% of the time/requires cueing 10 - 24% of the time  Memory Memory assist level: Recognizes or recalls 50 - 74% of the time/requires cueing 25 - 49% of the time    Medical Problem List and Plan: 1. Left lower extremity weakness and functional deficitssecondary to lumbar stenosis/L4-S1 radiculopathy s/p lumbar decompression and fusion (with re-do)  Cont CIR  Left AFO/PRAFO 2. H/o PE/DVT Prophylaxis/Anticoagulation: Mechanical: Sequential compression devices, below knee Bilateral lower extremities  Vascular study with chronic DVT in  femoral vein  Lovenox  3. Pain Management: Continue oxycodone with flexeril prn.   Gabapentin d/ced due to potential significant increase in tremors, restarted low dose, pt still not able to tolerate, d/ced  Lyrica 50 TID started 7/27 with ?same side effects, d/ced  Lidodermpatch ordered for left foot  Pamelor started 7/28, increased 7/30 to BID d/ced due to ?side effects 4. Mood: LCSW to follow for evaluation and support.  5. Neuropsych: This patient iscapable of making decisions on herown behalf. 6. Skin/Wound Care: Monitor wound for healing.  Wound dehiscence on doxy per NS started 8/3 7. Fluids/Electrolytes/Nutrition: Monitor I/O.  8. HTN: Monitor BP bid. On Norvasc, Inderal and HCTZ  Controlled 8/3  Monitor with increased mobility 9. Hemochromatosis: Stable. Has not had to be phlebotomized in 6 years.  10. Restless leg syndrome: On Mirapex 11. Urinary retention:   D/ced foley  Bladder training.   Bethanechol started 7/27, increased on 7/29, increased on 7/30, increased on 7/31- reduced to 25mg   Cont I/O caths  Cont to monitor 12. Constipation: Increased senna to bid. 13. Hypokalemia  K+ 3.5 on 8/3  Supplemented  Cont to monitor 14. Hypoalbuminemia  Supplement initiated 7/25 15. ABLA   Hb 8.4 on 8/3  Cont to monitor 16. Sleep disturbance  Home Temazepam restarted 7/27 17. Acute lower UTI  resolved 18.  Essential tremor   Cont propranolol  Vitals:   11/30/16 2200 12/01/16 0534  BP: (!) 156/70 (!) 141/62  Pulse: 79 72  Resp:  18  Temp:  98.1 F (36.7 C)    LOS (Days) 13 A FACE TO FACE EVALUATION WAS PERFORMED  Avriel Kandel Lorie Phenix 12/01/2016 8:51 AM

## 2016-12-01 NOTE — Progress Notes (Signed)
Occupational Therapy Session Note  Patient Details  Name: [name] MRN: [id number] Date of Birth: July 22, 1940  Today's Date: 12/01/2016 OT Individual Time: 1001-1058 OT Individual Time Calculation (min): 57 min    Short Term Goals: Week 2:  OT Short Term Goal 1 (Week 2): Continue with current LTGs set at supervision overall.    Skilled Therapeutic Interventions/Progress Updates:    Pt worked on donning shoes and AFO on the left foot at EOB.  She reported already getting washed up and dressed prior to session this am.  Had her transfer from the EOB to the wheelchair to work on fixing her hair and makeup at the sink.  Incorporated sit to stand and standing intervals while working.  Pt overall supervision level to complete all transitions and maintain standing without UE support, while using both UEs for adjusting hair or using curling iron.  Finished session with pt in wheelchair awaiting next therapy session.   Therapy Documentation Precautions:  Precautions Precautions: Back, Fall Precaution Booklet Issued: No Precaution Comments: No twisting/bending, no lifting over 5 lbs, no driving for 2 weeks  Required Braces or Orthoses: Spinal Brace Spinal Brace: Lumbar corset, Applied in sitting position Other Brace/Splint: Left AFO Restrictions Weight Bearing Restrictions: No  Pain: Pain Assessment Pain Assessment: 0-10 Pain Score: 1  Pain Type: Surgical pain Pain Location: Foot Pain Orientation: Left Pain Descriptors / Indicators: Discomfort Pain Onset: On-going Pain Intervention(s): Medication (See eMAR);Repositioned;Emotional support ADL: See Function Navigator for Current Functional Status.   Therapy/Group: Individual Therapy  Aaralynn Shepheard OTR/L 12/01/2016, 10:58 AM

## 2016-12-01 NOTE — Consult Note (Signed)
   [facility] CM Inpatient Consult   12/01/2016  SIANI UTKE 1940-06-08 [id number]  Referral received for post rehabilitation follow up in community for her [employer] plan.  Came by to speak with the patient and she was currently on the telephone and asked if writer could come back later. Chart reviewed for [facility] Care Management.  Chart review reveals the patient's current disposition is for home with home health care. Patient went on to work with therapy and this Probation officer will follow up.  3716: Patient back in her room.  Explained the [facility] Management program in her network with [employer].  Patient states she will have home health care.  Patient will also likely have transition of care calls from her primary care provider at [place].  She states she did not feel she needed a "whole lot of calls, I just can't wait to get in my own bed.  My daughter lives across the street from me and she can check on me as well."  Patient did sign [facility] consent form for follow up and needs.  For questions, please contact:  Natividad Brood, RN BSN [facility] Liaison  [phone] business mobile phone Toll free office [phone]

## 2016-12-02 DIAGNOSIS — T8131XA Disruption of external operation (surgical) wound, not elsewhere classified, initial encounter: Secondary | ICD-10-CM

## 2016-12-02 LAB — BASIC METABOLIC PANEL
ANION GAP: 8 (ref 5–15)
BUN: 17 mg/dL (ref 6–20)
CHLORIDE: 99 mmol/L — AB (ref 101–111)
CO2: 29 mmol/L (ref 22–32)
Calcium: 8.9 mg/dL (ref 8.9–10.3)
Creatinine, Ser: 0.89 mg/dL (ref 0.44–1.00)
GFR calc non Af Amer: >60 mL/min (ref >60)
GLUCOSE: 89 mg/dL (ref 65–99)
Potassium: 3.4 mmol/L — ABNORMAL LOW (ref 3.5–5.1)
Sodium: 136 mmol/L (ref 135–145)

## 2016-12-02 LAB — CBC
HCT: 27.3 % — ABNORMAL LOW (ref 36.0–46.0)
HEMOGLOBIN: 8.6 g/dL — AB (ref 12.0–15.0)
MCH: 25.2 pg — AB (ref 26.0–34.0)
MCHC: 31.5 g/dL (ref 30.0–36.0)
MCV: 80.1 fL (ref 78.0–100.0)
Platelets: 288 10*3/uL (ref 150–400)
RBC: 3.41 MIL/uL — AB (ref 3.87–5.11)
RDW: 15 % (ref 11.5–15.5)
WBC: 5.4 10*3/uL (ref 4.0–10.5)

## 2016-12-02 MED ORDER — DOXYCYCLINE HYCLATE 100 MG PO TABS: 100.0000 mg | ORAL_TABLET | Freq: Two times a day (BID) | ORAL | 0 refills | Status: DC

## 2016-12-02 MED ORDER — CYCLOBENZAPRINE HCL 10 MG PO TABS: 5.0000 mg | ORAL_TABLET | Freq: Two times a day (BID) | ORAL | 0 refills | Status: DC | PRN

## 2016-12-02 MED ORDER — TRAMADOL HCL 50 MG PO TABS: 50.0000 mg | ORAL_TABLET | Freq: Four times a day (QID) | ORAL | 0 refills | Status: DC | PRN

## 2016-12-02 MED ORDER — COLCHICINE 0.6 MG PO TABS: 0.6000 mg | ORAL_TABLET | Freq: Every day | ORAL | Status: DC

## 2016-12-02 MED ORDER — SENNOSIDES-DOCUSATE SODIUM 8.6-50 MG PO TABS: 2.0000 | ORAL_TABLET | Freq: Every day | ORAL | 0 refills | Status: DC

## 2016-12-02 MED ORDER — PROPRANOLOL HCL 20 MG PO TABS: 20.0000 mg | ORAL_TABLET | Freq: Two times a day (BID) | ORAL | 0 refills | Status: DC

## 2016-12-02 MED ORDER — LIDOCAINE 5 % EX PTCH: 1.0000 | MEDICATED_PATCH | CUTANEOUS | 0 refills | Status: DC

## 2016-12-02 MED ORDER — POTASSIUM CHLORIDE CRYS ER 20 MEQ PO TBCR: 20.0000 meq | EXTENDED_RELEASE_TABLET | Freq: Two times a day (BID) | ORAL | 0 refills | Status: DC

## 2016-12-02 MED ORDER — BETHANECHOL CHLORIDE 25 MG PO TABS: 25.0000 mg | ORAL_TABLET | Freq: Three times a day (TID) | ORAL | 0 refills | Status: DC

## 2016-12-02 MED ORDER — POTASSIUM CHLORIDE CRYS ER 20 MEQ PO TBCR: 20.0000 meq | EXTENDED_RELEASE_TABLET | Freq: Every day | ORAL | 0 refills | Status: DC

## 2016-12-02 MED ORDER — TEMAZEPAM 15 MG PO CAPS: 15.0000 mg | ORAL_CAPSULE | Freq: Every day | ORAL | Status: DC

## 2016-12-02 NOTE — Progress Notes (Addendum)
[facility]     PROGRESS NOTE  Subjective/Complaints:  Pt seen sitting up in her chair getting ready this AM.  She states she is ready for discharge and to sleep in her own bed.    ROS: Denies CP, SOB, N/V/D.  Objective: Vital Signs: Blood pressure (!) 146/60, pulse 64, temperature 98.3 F (36.8 C), temperature source Oral, resp. rate 18, height 5\' 2"  (1.575 m), weight 84 kg (185 lb 3 oz), SpO2 96 %. No results found.  Recent Labs  12/02/16 0510  WBC 5.4  HGB 8.6*  HCT 27.3*  PLT 288    Recent Labs  12/02/16 0510  NA 136  K 3.4*  CL 99*  GLUCOSE 89  BUN 17  CREATININE 0.89  CALCIUM 8.9   CBG (last 3)   Recent Labs  11/30/16 0130  GLUCAP 75    Wt Readings from Last 3 Encounters:  11/26/16 84 kg (185 lb 3 oz)  11/13/16 90.7 kg (200 lb)  11/03/16 82.9 kg (182 lb 12.2 oz)    Physical Exam:  BP (!) 146/60 (BP Location: Left Arm)   Pulse 64   Temp 98.3 F (36.8 C) (Oral)   Resp 18   Ht 5\' 2"  (1.575 m)   Wt 84 kg (185 lb 3 oz)   SpO2 96%   BMI 33.87 kg/m  Constitutional: She appears well-developedand well-nourished.  HENT: Normocephalicand atraumatic.  Eyes: EOMI. No discharge. Cardiovascular: RRR. No JVD. Respiratory: Effort normal and breath sounds normal. GI: Soft. Bowel sounds are normal.   Musculoskeletal: She exhibits no edemaor tenderness.  Neurological: She is alertand oriented  Speech clear.  Motor: B/l UE 5/5.  RLE: 4/5 HF, 4+/5 KE, 5/5 ADF/PF LLE: HF 4+/5, KE 4+/5, ADF/PF 1+/5.  Skin: Skin is warmand dry. Incision dehisced  Psychiatric: She has a normal mood and affect. Her behavior is normal. Judgmentand thought contentnormal.   Assessment/Plan: 1. Functional deficits secondary to lumbar stenosis/L4-S1 radiculopathy s/p lumbar decompression and fusion  which require 3+ hours per day of interdisciplinary therapy in a comprehensive inpatient rehab setting. Physiatrist is providing close team  supervision and 24 hour management of active medical problems listed below. Physiatrist and rehab team continue to assess barriers to discharge/monitor patient progress toward functional and medical goals.  Function:  Bathing Bathing position   Position: Wheelchair/chair at sink  Bathing parts Body parts bathed by patient: Right arm, Left arm, Chest, Abdomen, Right upper leg, Left upper leg, Right lower leg, Left lower leg, Front perineal area, Buttocks, Back Body parts bathed by helper: Back  Bathing assist Assist Level: Supervision or verbal cues      Upper Body Dressing/Undressing Upper body dressing   What is the patient wearing?: Pull over shirt/dress, Orthosis, Bra Bra - Perfomed by patient: Thread/unthread right bra strap, Thread/unthread left bra strap, Hook/unhook bra (pull down sports bra)   Pull over shirt/dress - Perfomed by patient: Thread/unthread right sleeve, Thread/unthread left sleeve, Put head through opening, Pull shirt over trunk       Orthosis activity level: Performed by patient  Upper body assist Assist Level: More than reasonable time      Lower Body Dressing/Undressing Lower body dressing   What is the patient wearing?: Underwear, Pants, Shoes, AFO, Socks, Non-skid slipper socks Underwear - Performed by patient: Thread/unthread right underwear leg, Thread/unthread left underwear leg, Pull underwear up/down Underwear - Performed by helper: Pull underwear up/down Pants- Performed by patient: Thread/unthread right pants leg, Thread/unthread left pants  leg, Pull pants up/down Pants- Performed by helper: Pull pants up/down Non-skid slipper socks- Performed by patient: Don/doff right sock, Don/doff left sock (doff) Non-skid slipper socks- Performed by helper: Don/doff right sock, Don/doff left sock Socks - Performed by patient: Don/doff right sock, Don/doff left sock Socks - Performed by helper: Don/doff right sock, Don/doff left sock Shoes - Performed by  patient: Don/doff right shoe, Fasten right Shoes - Performed by helper: Don/doff left shoe, Fasten left   AFO - Performed by helper: Don/doff left AFO      Lower body assist Assist for lower body dressing: Supervision or verbal cues      Toileting Toileting   Toileting steps completed by patient: Adjust clothing prior to toileting, Performs perineal hygiene, Adjust clothing after toileting Toileting steps completed by helper: Adjust clothing prior to toileting, Performs perineal hygiene, Adjust clothing after toileting Toileting Assistive Devices: Grab bar or rail  Toileting assist Assist level: Supervision or verbal cues   Transfers Chair/bed transfer   Chair/bed transfer method: Stand pivot Chair/bed transfer assist level: Supervision or verbal cues Chair/bed transfer assistive device: Armrests, Medical sales representative     Max distance: 150 ft Assist level: Supervision or verbal cues   Wheelchair   Type: Manual Max wheelchair distance: 113ft  Assist Level: No help, No cues, assistive device, takes more than reasonable amount of time  Cognition Comprehension Comprehension assist level: Follows basic conversation/direction with no assist  Expression Expression assist level: Expresses basic needs/ideas: With no assist  Social Interaction Social Interaction assist level: Interacts appropriately with others with medication or extra time (anti-anxiety, antidepressant).  Problem Solving Problem solving assist level: Solves basic 75 - 89% of the time/requires cueing 10 - 24% of the time  Memory Memory assist level: Recognizes or recalls 50 - 74% of the time/requires cueing 25 - 49% of the time    Medical Problem List and Plan: 1. Left lower extremity weakness and functional deficitssecondary to lumbar stenosis/L4-S1 radiculopathy s/p lumbar decompression and fusion (with re-do)  D/c today  Will see patient for transitional care management in 1-2 weeks.  Left  AFO/PRAFO 2. H/o PE/DVT Prophylaxis/Anticoagulation: Mechanical: Sequential compression devices, below knee Bilateral lower extremities  Vascular study with chronic DVT in femoral vein  Lovenox  3. Pain Management: Continue oxycodone with flexeril prn.   Gabapentin d/ced due to potential significant increase in tremors, restarted low dose, pt still not able to tolerate, d/ced  Lyrica 50 TID started 7/27 with ?same side effects, d/ced  Lidodermpatch ordered for left foot  Pamelor started 7/28, increased 7/30 to BID d/ced due to ?side effects 4. Mood: LCSW to follow for evaluation and support.  5. Neuropsych: This patient iscapable of making decisions on herown behalf. 6. Skin/Wound Care: Monitor wound for healing.  Wound dehiscence on doxy per NS started 8/3 7. Fluids/Electrolytes/Nutrition: Monitor I/O.  8. HTN: Monitor BP bid. On Norvasc, Inderal and HCTZ  Labile, but overall controlled 8/7  Monitor with increased mobility 9. Hemochromatosis: Stable. Has not had to be phlebotomized in 6 years.  10. Restless leg syndrome: On Mirapex 11. Urinary retention:   D/ced foley  Bladder training.   Bethanechol started 7/27, increased on 7/29, increased on 7/30, increased on 7/31- reduced to 25mg   Cont I/O caths  Cont to monitor 12. Constipation: Increased senna to bid. 13. Hypokalemia  K+ 3.4 on 8/7  Will need labs as outpt  Supplemented  Cont to monitor 14. Hypoalbuminemia  Supplement initiated 7/25 15.  ABLA   Hb 8.6 on 8/7  Cont to monitor 16. Sleep disturbance  Home Temazepam restarted 7/27 17. Acute lower UTI  resolved 18.  Essential tremor   Cont propranolol   LOS (Days) 14 A FACE TO FACE EVALUATION WAS PERFORMED  Boston Catarino Lorie Phenix 12/02/2016 8:27 AM

## 2016-12-02 NOTE — Progress Notes (Signed)
Patient ID: [name], female   DOB: August 04, 1940, 76 y.o.   MRN: [id number] Subjective:  The patient is alert and pleasant. Her husband is at the bedside. She is looking forward to going home. Her left foot is weak. She denies leg pain.  Objective: Vital signs in last 24 hours: Temp:  [98.3 F (36.8 C)-98.4 F (36.9 C)] 98.3 F (36.8 C) (08/07 0528) Pulse Rate:  [64-89] 64 (08/07 0528) Resp:  [18] 18 (08/07 0528) BP: (146-166)/(60-85) 146/60 (08/07 0528) SpO2:  [96 %-98 %] 96 % (08/07 0528)  Intake/Output from previous day: 08/06 0701 - 08/07 0700 In: 740 [P.O.:740] Out: 1900 [Urine:1900] Intake/Output this shift: No intake/output data recorded.  Physical exam the patient's wound continues to heal. There is no drainage. There is a superficial dehiscence which is healing.  Lab Results:  Recent Labs  12/02/16 0510  WBC 5.4  HGB 8.6*  HCT 27.3*  PLT 288   BMET  Recent Labs  12/02/16 0510  NA 136  K 3.4*  CL 99*  CO2 29  GLUCOSE 89  BUN 17  CREATININE 0.89  CALCIUM 8.9    Studies/Results: No results found.  Assessment/Plan: Wound dehiscence: The patient's wound is healing well with empiric Keflex and dressing changes. Please continue the Keflex and dressing changes until I see her back in the office in about a week.  Left foot drop: We have discussed exercising. Hopefully this will recover with time.  LOS: 14 days     Altin Sease D 12/02/2016, 7:47 AM

## 2016-12-02 NOTE — Progress Notes (Signed)
Patient discharged to home accompanied by her spouse.

## 2016-12-02 NOTE — Progress Notes (Signed)
Social Work Discharge Note  The overall goal for the admission was met for:   Discharge location: Yes - home with family  Length of Stay: Yes - 14 days  Discharge activity level: Yes - supervision  Home/community participation: Yes  Services provided included: MD, RD, PT, OT, RN, Pharmacy and SW  Financial Services: Private Insurance: [employer]  Follow-up services arranged: Home Health: PT/OT/RN from [facility], DME: 20"x16" hemi-height lightweight w/c with basic cushion and BSC from [facility] and Patient/Family has no preference for HH/DME agencies  Comments (or additional information): Pt's husband went through family education with therapy over the weekend.  He and his dtr will assist patient as needed.    Patient/Family verbalized understanding of follow-up arrangements: Yes  Individual responsible for coordination of the follow-up plan: Pt with her husband and dtr for support  Confirmed correct DME delivered: ,  [facility] 12/02/2016    ,  [facility] 

## 2016-12-03 ENCOUNTER — Telehealth: Payer: Self-pay | Admitting: *Deleted

## 2016-12-03 DIAGNOSIS — Z4789 Encounter for other orthopedic aftercare: Secondary | ICD-10-CM | POA: Diagnosis not present

## 2016-12-03 DIAGNOSIS — I10 Essential (primary) hypertension: Secondary | ICD-10-CM | POA: Diagnosis not present

## 2016-12-03 DIAGNOSIS — Z981 Arthrodesis status: Secondary | ICD-10-CM | POA: Diagnosis not present

## 2016-12-03 DIAGNOSIS — M48061 Spinal stenosis, lumbar region without neurogenic claudication: Secondary | ICD-10-CM | POA: Diagnosis not present

## 2016-12-03 DIAGNOSIS — M5416 Radiculopathy, lumbar region: Secondary | ICD-10-CM | POA: Diagnosis not present

## 2016-12-03 NOTE — Telephone Encounter (Signed)
Transitional care call completed, appointment confirmed, conflict identified, appointment rescheduled for 10:00am on the same day, address confirmed, new patient packet mailed  Transitional Care Questions   Questions for our staff to ask patients on Transitional care 48 hour phone call:   1. Are you/is patient experiencing any problems since coming home? No  Are there any questions regarding any aspect of care? No  2. Are there any questions regarding medications administration/dosing? No  Are meds being taken as prescribed? Yes  Patient should review meds with caller to confirm   3. Have there been any falls?  No  4. Has Home Health been to the house and/or have they contacted you? Yes, nurse and physical therapy  If not, have you tried to contact them? Can we help you contact them?   5. Are bowels and bladder emptying properly? Yes Are there any unexpected incontinence issues? No If applicable, is patient following bowel/bladder programs?   6. Any fevers, problems with breathing, unexpected pain?   No, No, No  7. Are there any skin problems or new areas of breakdown?  No  8. Has the patient/family member arranged specialty MD follow up (ie cardiology/neurology/renal/surgical/etc)? Yes Can we help arrange? No   9. Does the patient need any other services or support that we can help arrange? No  10. Are caregivers following through as expected in assisting the patient? Yes  11. Has the patient quit smoking, drinking alcohol, or using drugs as recommended? No drinking, No smoking, No illicit drug use

## 2016-12-05 DIAGNOSIS — M5416 Radiculopathy, lumbar region: Secondary | ICD-10-CM | POA: Diagnosis not present

## 2016-12-05 DIAGNOSIS — Z981 Arthrodesis status: Secondary | ICD-10-CM | POA: Diagnosis not present

## 2016-12-05 DIAGNOSIS — I10 Essential (primary) hypertension: Secondary | ICD-10-CM | POA: Diagnosis not present

## 2016-12-05 DIAGNOSIS — Z4789 Encounter for other orthopedic aftercare: Secondary | ICD-10-CM | POA: Diagnosis not present

## 2016-12-05 DIAGNOSIS — M48061 Spinal stenosis, lumbar region without neurogenic claudication: Secondary | ICD-10-CM | POA: Diagnosis not present

## 2016-12-05 NOTE — Discharge Instructions (Signed)
Inpatient Rehab Discharge Instructions  [name] Discharge date and time:  12/02/16  Activities/Precautions/ Functional Status: Activity: no lifting, driving, or strenuous exercise for till cleared by MD Diet: regular diet Wound Care: Cleanse with normal saline. Paint edges with betadine. Cover with dry dressing and change dressing twice a day.  Contact MD if you develop any new problems with your incision/wound--redness, swelling, increase in pain, increase in drainage or if you develop fever or chills.    Functional status:  ___ No restrictions     ___ Walk up steps independently _X__ 24/7 supervision/assistance   ___ Walk up steps with assistance ___ Intermittent supervision/assistance  ___ Bathe/dress independently ___ Walk with walker     _X__ Bathe/dress with supervision.  ___ Walk Independently    ___ Shower independently _X__ Walk with supervision.                  ___ Shower with assistance _X__ No alcohol     ___ Return to work/school ________   COMMUNITY REFERRALS UPON DISCHARGE:   Home Health:   PT     OT     RN  Agency:  [facility] Phone:  [phone] Medical Equipment/Items Ordered:  20"x16" hemi-height, lightweight wheelchair with basic cushion; bedside commode  Agency/Supplier:  [facility]   Special Instructions: 1. You need to toilet yourself every 4 hours. Double void each time to fully empty the bladder. Contact MD if you start having difficulty voiding.  2. No bending, twisting or arching. No lifting items over 5 lbs.    My questions have been answered and I understand these instructions. I will adhere to these goals and the provided educational materials after my discharge from the hospital.  Patient/Caregiver Signature _______________________________ Date __________  Clinician Signature _______________________________________ Date __________  Please bring this form and your medication list with you to all your follow-up doctor's  appointments.

## 2016-12-09 DIAGNOSIS — M4316 Spondylolisthesis, lumbar region: Secondary | ICD-10-CM | POA: Diagnosis not present

## 2016-12-10 ENCOUNTER — Encounter: Payer: PPO | Attending: Physical Medicine & Rehabilitation | Admitting: Physical Medicine & Rehabilitation

## 2016-12-10 ENCOUNTER — Encounter: Payer: Self-pay | Admitting: Physical Medicine & Rehabilitation

## 2016-12-10 ENCOUNTER — Encounter: Payer: PPO | Admitting: Physical Medicine & Rehabilitation

## 2016-12-10 VITALS — BP 147/76 | HR 68 | Resp 14

## 2016-12-10 DIAGNOSIS — M5417 Radiculopathy, lumbosacral region: Secondary | ICD-10-CM | POA: Insufficient documentation

## 2016-12-10 DIAGNOSIS — G2581 Restless legs syndrome: Secondary | ICD-10-CM | POA: Diagnosis not present

## 2016-12-10 DIAGNOSIS — R200 Anesthesia of skin: Secondary | ICD-10-CM | POA: Diagnosis not present

## 2016-12-10 DIAGNOSIS — K219 Gastro-esophageal reflux disease without esophagitis: Secondary | ICD-10-CM | POA: Diagnosis not present

## 2016-12-10 DIAGNOSIS — M792 Neuralgia and neuritis, unspecified: Secondary | ICD-10-CM

## 2016-12-10 DIAGNOSIS — Z8673 Personal history of transient ischemic attack (TIA), and cerebral infarction without residual deficits: Secondary | ICD-10-CM | POA: Diagnosis not present

## 2016-12-10 DIAGNOSIS — G510 Bell's palsy: Secondary | ICD-10-CM | POA: Insufficient documentation

## 2016-12-10 DIAGNOSIS — R339 Retention of urine, unspecified: Secondary | ICD-10-CM | POA: Diagnosis not present

## 2016-12-10 DIAGNOSIS — I82441 Acute embolism and thrombosis of right tibial vein: Secondary | ICD-10-CM | POA: Diagnosis not present

## 2016-12-10 DIAGNOSIS — Z86711 Personal history of pulmonary embolism: Secondary | ICD-10-CM | POA: Insufficient documentation

## 2016-12-10 DIAGNOSIS — R202 Paresthesia of skin: Secondary | ICD-10-CM | POA: Diagnosis not present

## 2016-12-10 DIAGNOSIS — M21372 Foot drop, left foot: Secondary | ICD-10-CM | POA: Diagnosis not present

## 2016-12-10 DIAGNOSIS — Z86718 Personal history of other venous thrombosis and embolism: Secondary | ICD-10-CM | POA: Diagnosis not present

## 2016-12-10 DIAGNOSIS — D6852 Prothrombin gene mutation: Secondary | ICD-10-CM | POA: Insufficient documentation

## 2016-12-10 DIAGNOSIS — M5416 Radiculopathy, lumbar region: Secondary | ICD-10-CM | POA: Diagnosis not present

## 2016-12-10 DIAGNOSIS — K449 Diaphragmatic hernia without obstruction or gangrene: Secondary | ICD-10-CM | POA: Insufficient documentation

## 2016-12-10 DIAGNOSIS — I10 Essential (primary) hypertension: Secondary | ICD-10-CM | POA: Diagnosis not present

## 2016-12-10 DIAGNOSIS — M48061 Spinal stenosis, lumbar region without neurogenic claudication: Secondary | ICD-10-CM | POA: Diagnosis not present

## 2016-12-10 DIAGNOSIS — E876 Hypokalemia: Secondary | ICD-10-CM | POA: Diagnosis not present

## 2016-12-10 DIAGNOSIS — T8131XS Disruption of external operation (surgical) wound, not elsewhere classified, sequela: Secondary | ICD-10-CM

## 2016-12-10 DIAGNOSIS — R531 Weakness: Secondary | ICD-10-CM | POA: Diagnosis not present

## 2016-12-10 DIAGNOSIS — M48062 Spinal stenosis, lumbar region with neurogenic claudication: Secondary | ICD-10-CM

## 2016-12-10 DIAGNOSIS — Z9889 Other specified postprocedural states: Secondary | ICD-10-CM

## 2016-12-10 DIAGNOSIS — R269 Unspecified abnormalities of gait and mobility: Secondary | ICD-10-CM

## 2016-12-10 DIAGNOSIS — Z981 Arthrodesis status: Secondary | ICD-10-CM | POA: Insufficient documentation

## 2016-12-10 DIAGNOSIS — R21 Rash and other nonspecific skin eruption: Secondary | ICD-10-CM

## 2016-12-10 DIAGNOSIS — B029 Zoster without complications: Secondary | ICD-10-CM | POA: Diagnosis not present

## 2016-12-10 NOTE — Progress Notes (Signed)
Subjective:    Patient ID: [name], female    DOB: 12-05-1940, 76 y.o.   MRN: [id number]  HPI 76 y.o. female with history of HTN, PE/DVT, multiple back surgeries presents for transitional care management after receiving CIR lumbar radiculopathy s/p decompression and fusion.    Admit date: 11/18/2016 Discharge date: 12/02/2016  At discharge, she was instructed to follow up with PCP, she has an appointment later today.  She saw Neurosurg and has a follow up appointment in 2 weeks. She has been having it dressed. She is urinating without difficulty.  Pain is controlled for the most part. She is still on abx.  BP remains elevated.  Bowel movements are regular.  Sleep has improved.   DME: Bedside commode, shower chait Therapies: 2/week Mobility: Walker at home, wheelchair in community  Pain Inventory Average Pain 3 Pain Right Now 0 My pain is intermittent and tingling  In the last 24 hours, has pain interfered with the following? General activity 2 Relation with others 2 Enjoyment of life 2 What TIME of day is your pain at its worst? evening, night Sleep (in general) Good  Pain is worse with: sitting Pain improves with: medication Relief from Meds: 9  Mobility walk with assistance use a walker how many minutes can you walk? 6 do you drive?  no use a wheelchair needs help with transfers Do you have any goals in this area?  yes  Function retired  Neuro/Psych numbness tingling trouble walking  Prior Studies hospital follow up  Physicians involved in your care hospital follow up   Family History  Problem Relation Age of Onset  . Congestive Heart Failure Mother   . Pancreatic cancer Father    Social History   Social History  . Marital status: Married    Spouse name: N/A  . Number of children: N/A  . Years of education: N/A   Social History Main Topics  . Smoking status: Never Smoker  . Smokeless tobacco: Never Used     Comment: never used tobacco    . Alcohol use No  . Drug use: No  . Sexual activity: Not Asked   Other Topics Concern  . None   Social History Narrative  . None   Past Surgical History:  Procedure Laterality Date  . BACK SURGERY    . COLONOSCOPY    . SHOULDER SURGERY Bilateral    Past Medical History:  Diagnosis Date  . Anemia   . Arthritis   . Bell's palsy   . GERD (gastroesophageal reflux disease)   . Hemochromatosis   . History of hiatal hernia   . Hypertension   . PE (pulmonary embolism)   . Pneumonia    "walking" pneumonia  . Prothrombin gene mutation ([place]) 05/30/2013  . Restless legs   . Right leg DVT ([place]) 05/30/2013  . Stroke [facility])    found on a MRI, she's not aware otherwise   BP (!) 147/76 (BP Location: Right Arm, Patient Position: Sitting, Cuff Size: Normal)   Pulse 68   Resp 14   SpO2 93%   Opioid Risk Score:   Fall Risk Score:  `1  Depression screen PHQ 2/9  No flowsheet data found. 2 Review of Systems  Constitutional: Negative.   HENT: Negative.   Eyes: Negative.   Respiratory: Negative.   Cardiovascular: Negative.   Gastrointestinal: Negative.   Endocrine: Negative.   Genitourinary: Negative.   Musculoskeletal: Positive for gait problem.  Skin: Positive for rash.  Allergic/Immunologic:  Negative.   Neurological: Positive for numbness.       Tingling  Hematological: Negative.   Psychiatric/Behavioral: Negative.   All other systems reviewed and are negative.      Objective:   Physical Exam Constitutional: She appears well-developed and well-nourished.  HENT: Normocephalic and atraumatic.  Eyes: EOMI. No discharge. Cardiovascular: RRR.  No JVD. Respiratory: Effort normal and breath sounds normal. GI: Soft. Bowel sounds are normal.   Musculoskeletal: She exhibits no edema or tenderness.  Neurological: She is alert and oriented  Speech clear.  Motor: B/l UE 5/5.  RLE: 4/5 HF, 4+/5 KE, 5/5 ADF/PF LLE: HF 4+/5, KE 4+/5, ADF/PF 1+/5.  Skin: Skin is warm and dry.   Erythematous rash right posterior leg Psychiatric: She has a normal mood and affect. Her behavior is normal. Judgment and thought content normal.     Assessment & Plan:  76 y.o. female with history of HTN, PE/DVT, multiple back surgeries presents for transitional care management after receiving CIR lumbar radiculopathy s/p decompression and fusion.    1.  Left lower extremity weakness and functional deficits secondary to lumbar stenosis/L4-S1 radiculopathy s/p lumbar decompression and fusion (with re-do)  Cont therapies  Cont follow up with Neurosurg  2.  H/o PE/chronic DVT Prophylaxis/Anticoagulation:   Encouraged follow up with Hematology to determine resuming anticoag  3. Pain Management  Relatively controlled  Cont to meds  4. Skin/Wound Care  Cont dressing per Neurosurg  Cont abx per Neurosurg  5. HTN  Elevated in office  Encouraged follow up with PCP  6. Urinary retention:   D/c bethanechol  7. Hypokalemia  Labs per PCP per pt  8. Right posterior leg rash  ?VZV  Pt to follow up with PCP  9. Gait abnormality  Cont walker/wheelchair  Cont therapies   Meds reviewed Referrals reviewed All questions answered

## 2016-12-10 NOTE — Patient Instructions (Signed)
Please follow up with Hematology for chronic DVT  Please follow up with PCP regarding rash

## 2016-12-11 DIAGNOSIS — Z981 Arthrodesis status: Secondary | ICD-10-CM | POA: Diagnosis not present

## 2016-12-11 DIAGNOSIS — I10 Essential (primary) hypertension: Secondary | ICD-10-CM | POA: Diagnosis not present

## 2016-12-11 DIAGNOSIS — M5416 Radiculopathy, lumbar region: Secondary | ICD-10-CM | POA: Diagnosis not present

## 2016-12-11 DIAGNOSIS — M48061 Spinal stenosis, lumbar region without neurogenic claudication: Secondary | ICD-10-CM | POA: Diagnosis not present

## 2016-12-11 DIAGNOSIS — Z4789 Encounter for other orthopedic aftercare: Secondary | ICD-10-CM | POA: Diagnosis not present

## 2016-12-12 ENCOUNTER — Telehealth: Payer: Self-pay

## 2016-12-12 NOTE — Telephone Encounter (Signed)
Patient called as an FYI letting Dr. Marin Olp know she had back surgery 3 weeks ago due to a screw coming loose, and had to have a repair.  She now has shingles but is being treated for them.  Just wanted to make Dr. Marin Olp aware.  She stated she took Lovenox injections twice a day while she was hospitalized.

## 2016-12-16 DIAGNOSIS — Z981 Arthrodesis status: Secondary | ICD-10-CM | POA: Diagnosis not present

## 2016-12-16 DIAGNOSIS — M5416 Radiculopathy, lumbar region: Secondary | ICD-10-CM | POA: Diagnosis not present

## 2016-12-16 DIAGNOSIS — I10 Essential (primary) hypertension: Secondary | ICD-10-CM | POA: Diagnosis not present

## 2016-12-16 DIAGNOSIS — M48061 Spinal stenosis, lumbar region without neurogenic claudication: Secondary | ICD-10-CM | POA: Diagnosis not present

## 2016-12-16 DIAGNOSIS — Z4789 Encounter for other orthopedic aftercare: Secondary | ICD-10-CM | POA: Diagnosis not present

## 2016-12-24 DIAGNOSIS — Z981 Arthrodesis status: Secondary | ICD-10-CM | POA: Diagnosis not present

## 2016-12-24 DIAGNOSIS — I10 Essential (primary) hypertension: Secondary | ICD-10-CM | POA: Diagnosis not present

## 2016-12-24 DIAGNOSIS — Z4789 Encounter for other orthopedic aftercare: Secondary | ICD-10-CM | POA: Diagnosis not present

## 2016-12-24 DIAGNOSIS — M48061 Spinal stenosis, lumbar region without neurogenic claudication: Secondary | ICD-10-CM | POA: Diagnosis not present

## 2016-12-24 DIAGNOSIS — M5416 Radiculopathy, lumbar region: Secondary | ICD-10-CM | POA: Diagnosis not present

## 2016-12-30 DIAGNOSIS — I10 Essential (primary) hypertension: Secondary | ICD-10-CM | POA: Diagnosis not present

## 2016-12-30 DIAGNOSIS — M5416 Radiculopathy, lumbar region: Secondary | ICD-10-CM | POA: Diagnosis not present

## 2016-12-30 DIAGNOSIS — Z981 Arthrodesis status: Secondary | ICD-10-CM | POA: Diagnosis not present

## 2016-12-30 DIAGNOSIS — M48061 Spinal stenosis, lumbar region without neurogenic claudication: Secondary | ICD-10-CM | POA: Diagnosis not present

## 2016-12-30 DIAGNOSIS — Z4789 Encounter for other orthopedic aftercare: Secondary | ICD-10-CM | POA: Diagnosis not present

## 2017-01-02 DIAGNOSIS — M48062 Spinal stenosis, lumbar region with neurogenic claudication: Secondary | ICD-10-CM | POA: Diagnosis not present

## 2017-01-05 ENCOUNTER — Other Ambulatory Visit: Payer: Self-pay | Admitting: Family

## 2017-01-05 ENCOUNTER — Telehealth: Payer: Self-pay | Admitting: *Deleted

## 2017-01-05 ENCOUNTER — Ambulatory Visit (HOSPITAL_BASED_OUTPATIENT_CLINIC_OR_DEPARTMENT_OTHER)
Admission: RE | Admit: 2017-01-05 | Discharge: 2017-01-05 | Disposition: A | Discharge status: Home / Self Care | Payer: PPO | Source: Ambulatory Visit | Attending: Family | Admitting: Family

## 2017-01-05 DIAGNOSIS — Z86718 Personal history of other venous thrombosis and embolism: Secondary | ICD-10-CM | POA: Insufficient documentation

## 2017-01-05 DIAGNOSIS — M7989 Other specified soft tissue disorders: Secondary | ICD-10-CM | POA: Diagnosis not present

## 2017-01-05 NOTE — Telephone Encounter (Signed)
Patient is s/p surgery this summer and has had decreased mobility to her LEFT leg and foot. She has noticed in the last few days that the LEFT leg and foot has become significantly swollen. The right leg is not swollen. The patient has a history of DVT.   Reviewed with Laverna Peace NP. She wants patient to have a doppler done. Order placed.  Informed patient of order. She is aware to call for appointment today. Will follow up depending on results.

## 2017-01-06 ENCOUNTER — Telehealth: Payer: Self-pay | Admitting: *Deleted

## 2017-01-06 NOTE — Telephone Encounter (Addendum)
Patient is aware of results.   ----- Message from Eliezer Bottom, NP sent at 01/06/2017 11:00 AM EDT ----- Regarding: [place] No DVT present! Thank you!  Sarah  ----- Message ----- From: Buel Ream, Rad Results In Sent: 01/05/2017   9:17 PM To: Eliezer Bottom, NP

## 2017-01-07 ENCOUNTER — Other Ambulatory Visit: Payer: Self-pay | Admitting: Neurosurgery

## 2017-01-07 DIAGNOSIS — M21372 Foot drop, left foot: Secondary | ICD-10-CM

## 2017-01-08 ENCOUNTER — Other Ambulatory Visit: Payer: Self-pay | Admitting: Neurosurgery

## 2017-01-08 DIAGNOSIS — M21372 Foot drop, left foot: Secondary | ICD-10-CM

## 2017-01-12 DIAGNOSIS — Z981 Arthrodesis status: Secondary | ICD-10-CM | POA: Diagnosis not present

## 2017-01-12 DIAGNOSIS — I10 Essential (primary) hypertension: Secondary | ICD-10-CM | POA: Diagnosis not present

## 2017-01-12 DIAGNOSIS — M5416 Radiculopathy, lumbar region: Secondary | ICD-10-CM | POA: Diagnosis not present

## 2017-01-12 DIAGNOSIS — M48061 Spinal stenosis, lumbar region without neurogenic claudication: Secondary | ICD-10-CM | POA: Diagnosis not present

## 2017-01-12 DIAGNOSIS — Z4789 Encounter for other orthopedic aftercare: Secondary | ICD-10-CM | POA: Diagnosis not present

## 2017-01-13 ENCOUNTER — Ambulatory Visit
Admission: RE | Admit: 2017-01-13 | Discharge: 2017-01-13 | Disposition: A | Discharge status: Home / Self Care | Payer: PPO | Source: Ambulatory Visit | Attending: Neurosurgery | Admitting: Neurosurgery

## 2017-01-13 DIAGNOSIS — M21372 Foot drop, left foot: Secondary | ICD-10-CM

## 2017-01-13 DIAGNOSIS — M48061 Spinal stenosis, lumbar region without neurogenic claudication: Secondary | ICD-10-CM | POA: Diagnosis not present

## 2017-01-13 MED ORDER — GADOBENATE DIMEGLUMINE 529 MG/ML IV SOLN
18.0000 mL | Freq: Once | INTRAVENOUS | Status: AC | PRN
  Administered 2017-01-13: 18 mL via INTRAVENOUS

## 2017-01-19 DIAGNOSIS — Z23 Encounter for immunization: Secondary | ICD-10-CM | POA: Diagnosis not present

## 2017-01-19 DIAGNOSIS — R600 Localized edema: Secondary | ICD-10-CM | POA: Diagnosis not present

## 2017-01-21 ENCOUNTER — Encounter: Payer: PPO | Admitting: Physical Medicine & Rehabilitation

## 2017-02-01 DIAGNOSIS — M48062 Spinal stenosis, lumbar region with neurogenic claudication: Secondary | ICD-10-CM | POA: Diagnosis not present

## 2017-02-10 DIAGNOSIS — M4316 Spondylolisthesis, lumbar region: Secondary | ICD-10-CM | POA: Diagnosis not present

## 2017-02-10 DIAGNOSIS — M21372 Foot drop, left foot: Secondary | ICD-10-CM | POA: Diagnosis not present

## 2017-02-21 ENCOUNTER — Encounter (HOSPITAL_BASED_OUTPATIENT_CLINIC_OR_DEPARTMENT_OTHER): Payer: Self-pay | Admitting: Emergency Medicine

## 2017-02-21 ENCOUNTER — Emergency Department (HOSPITAL_BASED_OUTPATIENT_CLINIC_OR_DEPARTMENT_OTHER): Payer: PPO

## 2017-02-21 ENCOUNTER — Emergency Department (HOSPITAL_BASED_OUTPATIENT_CLINIC_OR_DEPARTMENT_OTHER)
Admission: EM | Admit: 2017-02-21 | Discharge: 2017-02-21 | Disposition: A | Discharge status: Home / Self Care | Payer: PPO | Attending: Emergency Medicine | Admitting: Emergency Medicine

## 2017-02-21 DIAGNOSIS — D649 Anemia, unspecified: Secondary | ICD-10-CM | POA: Diagnosis not present

## 2017-02-21 DIAGNOSIS — E876 Hypokalemia: Secondary | ICD-10-CM | POA: Diagnosis not present

## 2017-02-21 DIAGNOSIS — Z8673 Personal history of transient ischemic attack (TIA), and cerebral infarction without residual deficits: Secondary | ICD-10-CM | POA: Diagnosis not present

## 2017-02-21 DIAGNOSIS — Z86718 Personal history of other venous thrombosis and embolism: Secondary | ICD-10-CM | POA: Diagnosis not present

## 2017-02-21 DIAGNOSIS — R2242 Localized swelling, mass and lump, left lower limb: Secondary | ICD-10-CM | POA: Insufficient documentation

## 2017-02-21 DIAGNOSIS — M7989 Other specified soft tissue disorders: Secondary | ICD-10-CM | POA: Diagnosis not present

## 2017-02-21 DIAGNOSIS — R0602 Shortness of breath: Secondary | ICD-10-CM | POA: Diagnosis not present

## 2017-02-21 DIAGNOSIS — Z79899 Other long term (current) drug therapy: Secondary | ICD-10-CM | POA: Diagnosis not present

## 2017-02-21 DIAGNOSIS — I10 Essential (primary) hypertension: Secondary | ICD-10-CM | POA: Diagnosis not present

## 2017-02-21 LAB — CBC
HEMATOCRIT: 25.8 % — AB (ref 36.0–46.0)
HEMOGLOBIN: 7.8 g/dL — AB (ref 12.0–15.0)
MCH: 24.5 pg — ABNORMAL LOW (ref 26.0–34.0)
MCHC: 30.2 g/dL (ref 30.0–36.0)
MCV: 81.1 fL (ref 78.0–100.0)
Platelets: 275 10*3/uL (ref 150–400)
RBC: 3.18 MIL/uL — AB (ref 3.87–5.11)
RDW: 16.6 % — ABNORMAL HIGH (ref 11.5–15.5)
WBC: 3.9 10*3/uL — ABNORMAL LOW (ref 4.0–10.5)

## 2017-02-21 LAB — BASIC METABOLIC PANEL
ANION GAP: 8 (ref 5–15)
BUN: 18 mg/dL (ref 6–20)
CHLORIDE: 98 mmol/L — AB (ref 101–111)
CO2: 30 mmol/L (ref 22–32)
CREATININE: 0.81 mg/dL (ref 0.44–1.00)
Calcium: 9.1 mg/dL (ref 8.9–10.3)
GFR calc non Af Amer: >60 mL/min (ref >60)
Glucose, Bld: 95 mg/dL (ref 65–99)
POTASSIUM: 3.1 mmol/L — AB (ref 3.5–5.1)
SODIUM: 136 mmol/L (ref 135–145)

## 2017-02-21 LAB — TROPONIN I

## 2017-02-21 MED ORDER — IOPAMIDOL (ISOVUE-370) INJECTION 76%
100.0000 mL | Freq: Once | INTRAVENOUS | Status: AC | PRN
  Administered 2017-02-21: 100 mL via INTRAVENOUS

## 2017-02-21 NOTE — ED Provider Notes (Signed)
[facility] Provider Note   CSN: [id number] Arrival date & time: 02/21/17  1238     History   Chief Complaint Chief Complaint  Patient presents with  . Shortness of Breath    HPI [name] is a 76 y.o. female presenting with shortness of breath and left lower leg pain.   Patient states that 3 days ago, she traveled back from [place].  Since then, she has had worsening left lower leg swelling and increased dyspnea on exertion.  She is concerned this is a blood clot, as she has a history of both DVT and PE.  She has prothrombin gene mutation, and is not on blood thinners.  She takes 2 baby aspirin daily.  She reports surgery over the summer, and has not been very ambulatory since.  She reports a minor cough beginning today, described as a "rattling cough," which is nonproductive.  She denies history of cancer or estrogen use.  She denies fevers, chills, chest pain, nausea, vomiting, abdominal pain, urinary symptoms, abnormal bowel movements, numbness, or tingling.  She denies fall, trauma, or injury.   HPI  Past Medical History:  Diagnosis Date  . Anemia   . Arthritis   . Bell's palsy   . GERD (gastroesophageal reflux disease)   . Hemochromatosis   . History of hiatal hernia   . Hypertension   . PE (pulmonary embolism)   . Pneumonia    "walking" pneumonia  . Prothrombin gene mutation ([place]) 05/30/2013  . Restless legs   . Right leg DVT ([place]) 05/30/2013  . Stroke [facility])    found on a MRI, she's not aware otherwise    Patient Active Problem List   Diagnosis Date Noted  . Postoperative wound dehiscence 12/02/2016  . E. coli UTI   . Abnormal urinalysis   . Medication side effect   . Neuropathic pain   . History of DVT (deep vein thrombosis)   . Benign essential HTN   . RLS (restless legs syndrome)   . Urinary retention   . Hypokalemia   . Hypoalbuminemia due to protein-calorie malnutrition ([place])   . Acute blood loss anemia   . Lumbar  stenosis with neurogenic claudication 11/18/2016  . Spondylolisthesis of lumbar region 11/12/2016  . Hereditary hemochromatosis ([place]) 07/28/2016  . Prothrombin gene mutation ([place]) 05/30/2013  . Right leg DVT ([place]) 05/30/2013  . Hemochromatosis 07/29/2012    Past Surgical History:  Procedure Laterality Date  . BACK SURGERY    . COLONOSCOPY    . SHOULDER SURGERY Bilateral     OB History    No data available       Home Medications    Prior to Admission medications   Medication Sig Start Date End Date Taking? Authorizing Provider  amLODipine (NORVASC) 5 MG tablet Take 5 mg by mouth daily.  07/23/11   [provider]  Biotin 5000 MCG TABS Take 5,000 mcg by mouth daily.     [provider]  Calcium Carbonate-Vitamin D (CALCIUM 600+D PO) Take 1 tablet by mouth 2 (two) times daily.    [provider]  Cholecalciferol (VITAMIN D) 2000 UNITS tablet Take 2,000 Units by mouth 2 (two) times daily.    [provider]  citalopram (CELEXA) 20 MG tablet Take 20 mg by mouth at bedtime.  08/05/11   [provider]  colchicine 0.6 MG tablet Take 1 tablet (0.6 mg total) by mouth daily. Gout flare up. 12/02/16   Bary Leriche,  PA-C  cyclobenzaprine (FLEXERIL) 10 MG tablet Take 0.5 tablets (5 mg total) by mouth 2 (two) times daily as needed for muscle spasms. 12/02/16   Love, Ivan Anchors, PA-C  doxycycline (VIBRA-TABS) 100 MG tablet Take 1 tablet (100 mg total) by mouth every 12 (twelve) hours. 12/02/16   Love, Ivan Anchors, PA-C  fish oil-omega-3 fatty acids 1000 MG capsule Take 1 g by mouth daily.     [provider]  hydrochlorothiazide (HYDRODIURIL) 25 MG tablet Take 25 mg by mouth daily.    [provider]  Hypromellose (ARTIFICIAL TEARS OP) Place 1 drop into both eyes 3 (three) times daily as needed (dry eyes).    [provider]  lidocaine (LIDODERM) 5 % Place 1 patch onto the skin daily. Apply to back at 8 am and remove at 8 pm daily (has  to be off for 12 hours daily) Can be purchased over the counter. 12/03/16   Love, Ivan Anchors, PA-C  Multiple Vitamin (MULTIVITAMIN WITH MINERALS) TABS tablet Take 1 tablet by mouth daily.    [provider]  pantoprazole (PROTONIX) 40 MG tablet Take 40 mg by mouth daily before supper.    [provider]  potassium chloride SA (K-DUR,KLOR-CON) 20 MEQ tablet Take 1 tablet (20 mEq total) by mouth 2 (two) times daily. 12/02/16   Love, Ivan Anchors, PA-C  pramipexole (MIRAPEX) 0.5 MG tablet Take 0.5 mg by mouth every evening.    [provider]  propranolol (INDERAL) 20 MG tablet Take 1 tablet (20 mg total) by mouth 2 (two) times daily. 12/02/16   Love, Ivan Anchors, PA-C  RESTASIS 0.05 % ophthalmic emulsion Place 1 drop into both eyes 2 (two) times daily. 09/16/16   [provider]  senna-docusate (SENOKOT-S) 8.6-50 MG tablet Take 2 tablets by mouth at bedtime. 12/02/16   Love, Ivan Anchors, PA-C  temazepam (RESTORIL) 15 MG capsule Take 1 capsule (15 mg total) by mouth at bedtime. 12/02/16   Love, Ivan Anchors, PA-C  traMADol (ULTRAM) 50 MG tablet Take 1 tablet (50 mg total) by mouth every 6 (six) hours as needed for moderate pain. 12/02/16   Love, Ivan Anchors, PA-C  Turmeric Curcumin 500 MG CAPS Take 500 mg by mouth at bedtime.     [provider]    Family History Family History  Problem Relation Age of Onset  . Congestive Heart Failure Mother   . Pancreatic cancer Father     Social History Social History  Substance Use Topics  . Smoking status: Never Smoker  . Smokeless tobacco: Never Used     Comment: never used tobacco  . Alcohol use No     Allergies   Penicillins; Sulfa antibiotics; and Morphine and related   Review of Systems Review of Systems  Constitutional: Negative for chills and fever.  HENT: Negative for congestion and sore throat.   Eyes: Negative for photophobia and visual disturbance.  Respiratory: Positive for shortness of breath. Negative for cough and  chest tightness.   Cardiovascular: Positive for leg swelling. Negative for chest pain and palpitations.  Gastrointestinal: Negative for abdominal pain, nausea and vomiting.  Genitourinary: Negative for dysuria, frequency and hematuria.  Musculoskeletal: Negative for back pain and neck pain.  Skin: Negative for wound.  Neurological: Negative for dizziness, syncope, weakness and light-headedness.  Hematological: Bruises/bleeds easily (2 baby ASA daily).     Physical Exam Updated Vital Signs BP (!) 146/69   Pulse 73   Temp 97.8 F (36.6 C) (Oral)  Resp 18   Ht 5\' 2"  (1.575 m)   Wt 81.6 kg (180 lb)   SpO2 96%   BMI 32.92 kg/m   Physical Exam  Constitutional: She is oriented to person, place, and time. She appears well-developed and well-nourished. No distress.  HENT:  Head: Normocephalic and atraumatic.  Eyes: EOM are normal.  Neck: Normal range of motion.  Cardiovascular: Normal rate, regular rhythm and intact distal pulses.   Pulmonary/Chest: Effort normal. No respiratory distress. She has no decreased breath sounds. She has no rhonchi. She has no rales. She exhibits no tenderness.  Speaking in full sentences without difficulty.  Intermittent scattered wheezes, which patient states is baseline.  Abdominal: Soft. Bowel sounds are normal. She exhibits no distension and no mass. There is no tenderness. There is no guarding.  Musculoskeletal: She exhibits edema.  Increased edema of left lower leg.  Pedal pulses intact bilaterally.  Patient with decreased mobility of left leg since the surgery, and history of left-sided dropfoot.  Sensation intact bilaterally.  Color and warmth are equal bilaterally.  Soft compartments.  Negative Homans sign.  Neurological: She is alert and oriented to person, place, and time.  Skin: Skin is warm and dry.  Psychiatric: She has a normal mood and affect.  Nursing note and vitals reviewed.    ED Treatments / Results  Labs (all labs ordered are  listed, but only abnormal results are displayed) Labs Reviewed  CBC - Abnormal; Notable for the following:       Result Value   WBC 3.9 (*)    RBC 3.18 (*)    Hemoglobin 7.8 (*)    HCT 25.8 (*)    MCH 24.5 (*)    RDW 16.6 (*)    All other components within normal limits  BASIC METABOLIC PANEL - Abnormal; Notable for the following:    Potassium 3.1 (*)    Chloride 98 (*)    All other components within normal limits  TROPONIN I    EKG  EKG Interpretation  Date/Time:  Saturday February 21 2017 12:51:27 EDT Ventricular Rate:  63 PR Interval:    QRS Duration: 90 QT Interval:  411 QTC Calculation: 421 R Axis:   20 Text Interpretation:  Sinus rhythm Anteroseptal infarct, old No significant change since last tracing Confirmed by Isla Pence [phone]) on 02/21/2017 1:23:56 PM       Radiology Dg Chest 2 View  Result Date: 02/21/2017 CLINICAL DATA:  Shortness of breath started 3 days ago. EXAM: CHEST  2 VIEW COMPARISON:  05/19/2016 FINDINGS: Again noted is a moderate to large sized hiatal hernia. No focal lung disease or pulmonary edema. Heart size is normal. Elevation of the right humeral head with narrowing of the right acromion-humeral head interval. Degenerative changes at the right [facility] joint. No large pleural effusions. Stable density at the right costophrenic angle suggestive for scarring. IMPRESSION: No active cardiopulmonary disease. Chronic changes in the right shoulder. Findings are suggestive for right rotator cuff disease. Electronically Signed   By: Markus Daft M.D.   On: 02/21/2017 13:30   Ct Angio Chest Pe W/cm &/or Wo Cm  Result Date: 02/21/2017 CLINICAL DATA:  Pt with SOB with exertion x 2 days; no chest pain; left leg pain AND swelling ; Chronic DVT right femoral vein (12/2015); pt also for Ultrasound today EXAM: CT ANGIOGRAPHY CHEST WITH CONTRAST TECHNIQUE: Multidetector CT imaging of the chest was performed using the standard protocol during bolus administration of  intravenous contrast. Multiplanar CT image  reconstructions and MIPs were obtained to evaluate the vascular anatomy. CONTRAST:  100 cc Isovue 370 COMPARISON:  Chest x-ray 02/21/2017 FINDINGS: Cardiovascular: The pulmonary arteries are well opacified. There is no acute pulmonary embolus. There is atherosclerotic calcification of the thoracic aorta, not associated with aneurysm. There is mild coronary artery calcification. No pericardial effusion. Heart size is normal. Mediastinum/Nodes: The visualized portion of the thyroid gland has a normal appearance. Moderate Edit hiatal hernia. Lungs/Pleura: No pulmonary nodules, pleural effusions, or infiltrates. The airways are patent. There is minimal atelectasis at the right lung base. Upper Abdomen: There is a 3 mm intrarenal calculus in the midpole region of the left kidney. The gallbladder is present. Musculoskeletal: There is moderate degenerative change in the thoracic spine. No suspicious lytic or blastic lesions are identified. Review of the MIP images confirms the above findings. IMPRESSION: 1. Technically adequate exam showing no acute pulmonary embolus. 2. Coronary artery disease. 3.  Aortic atherosclerosis.  (ICD10-I70.0) 4. Moderate hiatal hernia. 5. Nephrolithiasis. Electronically Signed   By: Nolon Nations M.D.   On: 02/21/2017 14:44   US Venous Img Lower  Left (dvt Study)  Result Date: 02/21/2017 CLINICAL DATA:  Shortness of breath for 2 days. Left lower extremity swelling. EXAM: LEFT LOWER EXTREMITY VENOUS DOPPLER ULTRASOUND TECHNIQUE: Gray-scale sonography with graded compression, as well as color Doppler and duplex ultrasound were performed to evaluate the lower extremity deep venous systems from the level of the common femoral vein and including the common femoral, femoral, profunda femoral, popliteal and calf veins including the posterior tibial, peroneal and gastrocnemius veins when visible. The superficial great saphenous vein was also  interrogated. Spectral Doppler was utilized to evaluate flow at rest and with distal augmentation maneuvers in the common femoral, femoral and popliteal veins. COMPARISON:  01/05/2017 FINDINGS: Contralateral Common Femoral Vein: Respiratory phasicity is normal and symmetric with the symptomatic side. No evidence of thrombus. Normal compressibility. Common Femoral Vein: No evidence of thrombus. Normal compressibility, respiratory phasicity and response to augmentation. Saphenofemoral Junction: No evidence of thrombus. Normal compressibility and flow on color Doppler imaging. Profunda Femoral Vein: No evidence of thrombus. Normal compressibility and flow on color Doppler imaging. Femoral Vein: No evidence of thrombus. Normal compressibility, respiratory phasicity and response to augmentation. Popliteal Vein: No evidence of thrombus. Normal compressibility, respiratory phasicity and response to augmentation. Calf Veins: No evidence of thrombus. Normal compressibility and flow on color Doppler imaging. Superficial Great Saphenous Vein: No evidence of thrombus. Normal compressibility. Other Findings:  Subcutaneous edema in the left calf and ankle. IMPRESSION: Negative for deep venous thrombosis in left lower extremity. Electronically Signed   By: Markus Daft M.D.   On: 02/21/2017 15:10    Procedures Procedures (including critical care time)  Medications Ordered in ED Medications  iopamidol (ISOVUE-370) 76 % injection 100 mL (100 mLs Intravenous Contrast Given 02/21/17 1413)     Initial Impression / Assessment and Plan / ED Course  I have reviewed the triage vital signs and the nursing notes.  Pertinent labs & imaging results that were available during my care of the patient were reviewed by me and considered in my medical decision making (see chart for details).     Patient presented with 3-day history of left lower leg swelling and increased dyspnea on exertion.  Physical exam shows left lower leg  swelling, otherwise reassuring.  She is speaking in full sentences without difficulty.  She is not hypoxic, tachycardic, or febrile.  Will order basic labs, troponin, EKG, chest x-ray,  left lower leg venous ultrasound, and CT to rule out PE.  Case discussed with attending, Dr. Gilford Raid evaluated the patient.  Troponin negative.  BMP shows hypokalemia.  CBC shows low white count and hemoglobin of 7.8, baseline 8.4.  Discussed with patient, patient to follow-up with primary care for trending of these levels.  Ultrasound negative for DVT.  Chest x-ray negative for infiltrate.  EKG without change.  CT shows no signs of PE.  Discussed findings with patient.  Discussed using compression sock for leg swelling.  The patient to follow-up with primary care for further evaluation of symptoms.  Case discussed with attending, Dr. Gilford Raid agrees to plan.  At this time, patient appears safe for discharge.  Strict return precautions given.  Patient states she understands and agrees to plan.   Final Clinical Impressions(s) / ED Diagnoses   Final diagnoses:  Left leg swelling  SOB (shortness of breath)  Anemia, unspecified type  Hypokalemia    New Prescriptions Discharge Medication List as of 02/21/2017  3:48 PM       Franchot Heidelberg, PA-C 02/21/17 1754    Isla Pence, MD 02/22/17 0800

## 2017-02-21 NOTE — ED Triage Notes (Signed)
SOB with exertion x 2 days, denies chest pain. Travelled to [facility] Sunday. Also reports L leg pain and swelling.

## 2017-02-21 NOTE — ED Notes (Signed)
ED Provider at bedside. 

## 2017-02-21 NOTE — Discharge Instructions (Signed)
It is very important that you follow-up with your primary care doctor next week for evaluation of your shortness of breath and blood levels. Additionally, you can discuss your leg swelling and symptoms of restless leg. Continue to take all your medicines as prescribed. When wearing the compression socks, do not roll or fold it over, as this will cause it to be too tight. Return to the emergency room if you develop fevers, persistent shortness of breath, chest pain, or any new or worsening symptoms.

## 2017-02-26 DIAGNOSIS — M5416 Radiculopathy, lumbar region: Secondary | ICD-10-CM | POA: Diagnosis not present

## 2017-02-26 DIAGNOSIS — E876 Hypokalemia: Secondary | ICD-10-CM | POA: Diagnosis not present

## 2017-02-26 DIAGNOSIS — M7989 Other specified soft tissue disorders: Secondary | ICD-10-CM | POA: Diagnosis not present

## 2017-02-26 DIAGNOSIS — R0602 Shortness of breath: Secondary | ICD-10-CM | POA: Diagnosis not present

## 2017-02-26 DIAGNOSIS — D508 Other iron deficiency anemias: Secondary | ICD-10-CM | POA: Diagnosis not present

## 2017-03-04 DIAGNOSIS — M48062 Spinal stenosis, lumbar region with neurogenic claudication: Secondary | ICD-10-CM | POA: Diagnosis not present

## 2017-03-09 ENCOUNTER — Ambulatory Visit (HOSPITAL_BASED_OUTPATIENT_CLINIC_OR_DEPARTMENT_OTHER): Payer: PPO | Admitting: Hematology & Oncology

## 2017-03-09 ENCOUNTER — Encounter: Payer: Self-pay | Admitting: Hematology & Oncology

## 2017-03-09 ENCOUNTER — Other Ambulatory Visit: Payer: PPO

## 2017-03-09 ENCOUNTER — Other Ambulatory Visit: Payer: Self-pay

## 2017-03-09 DIAGNOSIS — D500 Iron deficiency anemia secondary to blood loss (chronic): Secondary | ICD-10-CM

## 2017-03-09 DIAGNOSIS — M21372 Foot drop, left foot: Secondary | ICD-10-CM

## 2017-03-09 DIAGNOSIS — Z7982 Long term (current) use of aspirin: Secondary | ICD-10-CM | POA: Diagnosis not present

## 2017-03-09 DIAGNOSIS — D649 Anemia, unspecified: Secondary | ICD-10-CM

## 2017-03-09 DIAGNOSIS — D6852 Prothrombin gene mutation: Secondary | ICD-10-CM

## 2017-03-09 DIAGNOSIS — I82501 Chronic embolism and thrombosis of unspecified deep veins of right lower extremity: Secondary | ICD-10-CM | POA: Diagnosis not present

## 2017-03-09 LAB — CBC WITH DIFFERENTIAL (CANCER CENTER ONLY)
BASO#: 0 10*3/uL (ref 0.0–0.2)
BASO%: 0.6 % (ref 0.0–2.0)
EOS ABS: 0.4 10*3/uL (ref 0.0–0.5)
EOS%: 7.1 % — ABNORMAL HIGH (ref 0.0–7.0)
HEMATOCRIT: 29.5 % — AB (ref 34.8–46.6)
HEMOGLOBIN: 9 g/dL — AB (ref 11.6–15.9)
LYMPH#: 1.6 10*3/uL (ref 0.9–3.3)
LYMPH%: 31 % (ref 14.0–48.0)
MCH: 25.5 pg — AB (ref 26.0–34.0)
MCHC: 30.5 g/dL — AB (ref 32.0–36.0)
MCV: 84 fL (ref 81–101)
MONO#: 0.4 10*3/uL (ref 0.1–0.9)
MONO%: 8.6 % (ref 0.0–13.0)
NEUT%: 52.7 % (ref 39.6–80.0)
NEUTROS ABS: 2.7 10*3/uL (ref 1.5–6.5)
Platelets: 265 10*3/uL (ref 145–400)
RBC: 3.53 10*6/uL — AB (ref 3.70–5.32)
RDW: 19.1 % — ABNORMAL HIGH (ref 11.1–15.7)
WBC: 5.1 10*3/uL (ref 3.9–10.0)

## 2017-03-09 LAB — CMP (CANCER CENTER ONLY)
ALT: 24 U/L (ref 10–47)
AST: 29 U/L (ref 11–38)
Albumin: 3.8 g/dL (ref 3.3–5.5)
Alkaline Phosphatase: 85 U/L — ABNORMAL HIGH (ref 26–84)
BUN, Bld: 25 mg/dL — ABNORMAL HIGH (ref 7–22)
CALCIUM: 10.1 mg/dL (ref 8.0–10.3)
CHLORIDE: 99 meq/L (ref 98–108)
CO2: 31 meq/L (ref 18–33)
Creat: 1.2 mg/dl (ref 0.6–1.2)
Glucose, Bld: 94 mg/dL (ref 73–118)
POTASSIUM: 3.5 meq/L (ref 3.3–4.7)
Sodium: 146 mEq/L — ABNORMAL HIGH (ref 128–145)
TOTAL PROTEIN: 7.6 g/dL (ref 6.4–8.1)
Total Bilirubin: 0.7 mg/dl (ref 0.20–1.60)

## 2017-03-09 NOTE — Progress Notes (Signed)
Hematology and Oncology Follow Up Visit  [name] [id number] 28-Sep-1940 76 y.o. 03/09/2017   Principle Diagnosis:  DVT of the right leg Prothrombin II gene mutation Hemachromatosis (homozygous for C282Y Mutation) Past history of right lower extremity DVT/PE  Current Therapy:   Aspirin 162 mg PO daily    Interim History:  [name] is here today for a follow-up.  I am absolutely amazed as to what is happened to her.  She comes in with a rolling walker.  She had back surgery back in July.  Things did not go well.  She developed a foot drop in the left foot.  She needed to have repeat surgery because a screw was loose.  Unfortunately, the footdrop has not gotten better.  She is taking physical therapy to try to help with the foot drop.  She is quite anemic.  I suspect that she probably is iron deficient.  She saw her family doctor.  They gave her oral iron.  I told her to stop the oral iron as I did not think this was going to be absorbed.  She has had no obvious bleeding.  She is on aspirin.  She has not had any fever.  She has had no change in bowel or bladder habits.  She has had no nausea or vomiting.  Overall, I would say performance status is ECOG 2.    Medications:  Allergies as of 03/09/2017      Reactions   Penicillins Anaphylaxis   Pt was 8 or 76 years old  Has patient had a PCN reaction causing immediate rash, facial/tongue/throat swelling, SOB or lightheadedness with hypotension: Yes Has patient had a PCN reaction causing severe rash involving mucus membranes or skin necrosis: Unknown Has patient had a PCN reaction that required hospitalization: No Has patient had a PCN reaction occurring within the last 10 years: No If all of the above answers are "NO", then may proceed with Cephalosporin use.   Sulfa Antibiotics Other (See Comments)   unknown   Morphine And Related Other (See Comments)   Twitching in large doses       Medication List        Accurate as  of 03/09/17  5:37 PM. Always use your most recent med list.          amLODipine 5 MG tablet Commonly known as:  NORVASC Take 5 mg by mouth daily.   ARTIFICIAL TEARS OP Place 1 drop into both eyes 3 (three) times daily as needed (dry eyes).   Biotin 5000 MCG Tabs Take 5,000 mcg by mouth daily.   CALCIUM 600+D PO Take 1 tablet by mouth 2 (two) times daily.   citalopram 20 MG tablet Commonly known as:  CELEXA Take 20 mg by mouth at bedtime.   colchicine 0.6 MG tablet Take 1 tablet (0.6 mg total) by mouth daily. Gout flare up.   cyclobenzaprine 10 MG tablet Commonly known as:  FLEXERIL Take 0.5 tablets (5 mg total) by mouth 2 (two) times daily as needed for muscle spasms.   doxycycline 100 MG tablet Commonly known as:  VIBRA-TABS Take 1 tablet (100 mg total) by mouth every 12 (twelve) hours.   ferrous sulfate 325 (65 FE) MG tablet Take 325 mg 2 (two) times daily with a meal by mouth.   fish oil-omega-3 fatty acids 1000 MG capsule Take 1 g by mouth daily.   furosemide 20 MG tablet Commonly known as:  LASIX   hydrochlorothiazide 25 MG tablet Commonly  known as:  HYDRODIURIL Take 25 mg by mouth daily.   lidocaine 5 % Commonly known as:  LIDODERM Place 1 patch onto the skin daily. Apply to back at 8 am and remove at 8 pm daily (has to be off for 12 hours daily) Can be purchased over the counter.   multivitamin with minerals Tabs tablet Take 1 tablet by mouth daily.   pantoprazole 40 MG tablet Commonly known as:  PROTONIX Take 40 mg by mouth daily before supper.   potassium chloride SA 20 MEQ tablet Commonly known as:  K-DUR,KLOR-CON Take 1 tablet (20 mEq total) by mouth 2 (two) times daily.   pramipexole 0.5 MG tablet Commonly known as:  MIRAPEX Take 0.5 mg by mouth every evening.   propranolol 10 MG tablet Commonly known as:  INDERAL   RESTASIS 0.05 % ophthalmic emulsion Generic drug:  cycloSPORINE Place 1 drop into both eyes 2 (two) times daily.     temazepam 15 MG capsule Commonly known as:  RESTORIL Take 1 capsule (15 mg total) by mouth at bedtime.   Turmeric Curcumin 500 MG Caps Take 500 mg by mouth at bedtime.   Vitamin D 2000 units tablet Take 2,000 Units by mouth 2 (two) times daily.       Allergies:  Allergies  Allergen Reactions  . Penicillins Anaphylaxis    Pt was 72 or 76 years old  Has patient had a PCN reaction causing immediate rash, facial/tongue/throat swelling, SOB or lightheadedness with hypotension: Yes Has patient had a PCN reaction causing severe rash involving mucus membranes or skin necrosis: Unknown Has patient had a PCN reaction that required hospitalization: No Has patient had a PCN reaction occurring within the last 10 years: No If all of the above answers are "NO", then may proceed with Cephalosporin use.   . Sulfa Antibiotics Other (See Comments)    unknown  . Morphine And Related Other (See Comments)    Twitching in large doses     Past Medical History, Surgical history, Social history, and Family History were reviewed and updated.  Review of Systems: As stated in the interim history  Physical Exam:  weight is 181 lb (82.1 kg). Her oral temperature is 97.6 F (36.4 C). Her blood pressure is 153/60 (abnormal) and her pulse is 65. Her respiration is 19 and oxygen saturation is 100%.   Wt Readings from Last 3 Encounters:  03/09/17 181 lb (82.1 kg)  02/21/17 180 lb (81.6 kg)  11/26/16 185 lb 3 oz (84 kg)    Slightly ill-appearing white female in no obvious distress.  Head neck exam shows no ocular or oral lesions.  She has no palpable cervical or supraclavicular lymph nodes.  Lungs are clear bilaterally.  Cardiac exam regular rate and rhythm with no murmurs, rubs or bruits.  Abdomen is soft.  She has good bowel sounds.  There is no fluid wave.  There is no palpable liver or spleen tip.  Back exam shows the laminectomy scar in the lumbar spine that is healed.  Extremities shows the foot drop  with the left leg.  She has a brace on the left leg to prevent foot drop.  Right leg is without obvious venous thrombus.  There is no venous cord that is palpable in the right leg.  She has a negative Homans sign bilaterally.  She does have a compression stocking on the left leg.  Neurological exam shows the foot drop with the left foot.  Skin exam shows no rashes,  ecchymoses or petechia.    Lab Results  Component Value Date   WBC 5.1 03/09/2017   HGB 9.0 (L) 03/09/2017   HCT 29.5 (L) 03/09/2017   MCV 84 03/09/2017   PLT 265 03/09/2017   Lab Results  Component Value Date   FERRITIN 19 07/28/2016   IRON 30 (L) 07/28/2016   TIBC 274 07/28/2016   UIBC 244 07/28/2016   IRONPCTSAT 11 (L) 07/28/2016   Lab Results  Component Value Date   RETICCTPCT 0.9 05/11/2014   RBC 3.53 (L) 03/09/2017   RETICCTABS 39.7 05/11/2014   No results found for: KPAFRELGTCHN, LAMBDASER, KAPLAMBRATIO No results found for: IGGSERUM, IGA, IGMSERUM No results found for: Kathrynn Ducking, MSPIKE, SPEI   Chemistry      Component Value Date/Time   NA 146 (H) 03/09/2017 1242   NA 142 07/28/2016 1144   K 3.5 03/09/2017 1242   K 3.6 07/28/2016 1144   CL 99 03/09/2017 1242   CO2 31 03/09/2017 1242   CO2 33 (H) 07/28/2016 1144   BUN 25 (H) 03/09/2017 1242   BUN 22.6 07/28/2016 1144   CREATININE 1.2 03/09/2017 1242   CREATININE 1.0 07/28/2016 1144      Component Value Date/Time   CALCIUM 10.1 03/09/2017 1242   CALCIUM 9.5 07/28/2016 1144   ALKPHOS 85 (H) 03/09/2017 1242   ALKPHOS 84 07/28/2016 1144   AST 29 03/09/2017 1242   AST 24 07/28/2016 1144   ALT 24 03/09/2017 1242   ALT 17 07/28/2016 1144   BILITOT 0.70 03/09/2017 1242   BILITOT 0.41 07/28/2016 1144     Impression and Plan: Ms. Kithcart is 76 yo white female with a chronic DVT of the right leg. She is on aspirin.  I really feel bad for her because of this foot drop in the left leg.  I know this is  really causing a lot of issues for her.  I am also very disappointed that her hemoglobin is so low.  Even though she has hemochromatosis, she might be very iron deficient.  She was given oral iron by her family doctor.  This will not work because she is on a PPI which will prevent iron from being absorbed.  We will see what her iron levels are.  We will likely plan to get her back for IV iron which I know will work.  A lot has changed for Ms. Deangelo.  I am just surprised that she has had these difficulties.  I probably will plan to get her back in about 4 weeks or so, depending on her iron levels.   Volanda Napoleon, MD 11/12/20185:37 PM

## 2017-03-10 ENCOUNTER — Telehealth: Payer: Self-pay | Admitting: *Deleted

## 2017-03-10 ENCOUNTER — Encounter: Payer: Self-pay | Admitting: Hematology & Oncology

## 2017-03-10 DIAGNOSIS — D500 Iron deficiency anemia secondary to blood loss (chronic): Secondary | ICD-10-CM

## 2017-03-10 HISTORY — DX: Iron deficiency anemia secondary to blood loss (chronic): D50.0

## 2017-03-10 LAB — IRON AND TIBC
%SAT: 10 % — AB (ref 21–57)
Iron: 32 ug/dL — ABNORMAL LOW (ref 41–142)
TIBC: 320 ug/dL (ref 236–444)
UIBC: 288 ug/dL (ref 120–384)

## 2017-03-10 LAB — FERRITIN: FERRITIN: 43 ng/mL (ref 9–269)

## 2017-03-10 NOTE — Addendum Note (Signed)
Addended by: Burney Gauze R on: 03/10/2017 09:57 AM   Modules accepted: Orders

## 2017-03-10 NOTE — Telephone Encounter (Addendum)
Patient is aware of results. Appointment made  ----- Message from Volanda Napoleon, MD sent at 03/10/2017  9:53 AM EST ----- Call - iron level is low!!!  She needs 1 dose of Feraheme -- despite her hemochromatosis.  plese set this up!!  [name]

## 2017-03-11 ENCOUNTER — Ambulatory Visit: Payer: PPO | Attending: Neurosurgery | Admitting: Physical Therapy

## 2017-03-11 DIAGNOSIS — R262 Difficulty in walking, not elsewhere classified: Secondary | ICD-10-CM | POA: Insufficient documentation

## 2017-03-11 DIAGNOSIS — M25551 Pain in right hip: Secondary | ICD-10-CM | POA: Diagnosis not present

## 2017-03-11 DIAGNOSIS — M6281 Muscle weakness (generalized): Secondary | ICD-10-CM | POA: Diagnosis not present

## 2017-03-11 NOTE — Therapy (Signed)
[facility] [place], [place], [address] Phone: [phone]   Fax:  [phone]  Physical Therapy Evaluation  Patient Details  Name: [name] MRN: [id number] Date of Birth: 08-25-1940 Referring Provider: Newman Pies MD.   Encounter Date: 03/11/2017  PT End of Session - 03/11/17 1307    Visit Number  1    Number of Visits  16    Date for PT Re-Evaluation  05/10/17    PT Start Time  1055    PT Stop Time  8250    PT Time Calculation (min)  31 min       Past Medical History:  Diagnosis Date  . Anemia   . Arthritis   . Bell's palsy   . GERD (gastroesophageal reflux disease)   . Hemochromatosis   . History of hiatal hernia   . Hypertension   . Iron deficiency anemia due to chronic blood loss 03/10/2017  . PE (pulmonary embolism)   . Pneumonia    "walking" pneumonia  . Prothrombin gene mutation ([place]) 05/30/2013  . Restless legs   . Right leg DVT ([place]) 05/30/2013  . Stroke [facility])    found on a MRI, she's not aware otherwise    Past Surgical History:  Procedure Laterality Date  . BACK SURGERY    . COLONOSCOPY    . SHOULDER SURGERY Bilateral     There were no vitals filed for this visit.   Subjective Assessment - 03/11/17 1311    Subjective  The patient reports undergoing a lumbar fusion on 11/12/16 for right LE radicular symptoms. Then on 11/13/16, due to left LE numbness, per op reports this procedure was done due to a malplaced L5 pedicle screw.  She presented to the clinic today witha rolloator walker and left AFO.  She continues to c/o right sided pain moving into her upper glueal region.  She reports left leg soreness.  She has had 2 [place] test to rule out blood clots.  There test were negative.  She is wearing a left lower leg TED hose due to swelling.  She sttaes she is up and down a lot as she cannot get comfortable.  Her pain-level is an 8/10 today.  Prolonged sitting increases her pain.    Pertinent  History  Restless leg; stroke; shoulder surgery.    Limitations  Sitting    How long can you sit comfortably?  15 minutes.    Patient Stated Goals  I want to walk without a walker and sleep better.    Currently in Pain?  Yes    Pain Score  8     Pain Location  Hip    Pain Orientation  Right    Pain Descriptors / Indicators  Aching    Pain Type  Surgical pain    Pain Radiating Towards  Right.    Pain Onset  More than a month ago    Pain Frequency  Constant    Aggravating Factors   See above.    Pain Relieving Factors  See above.    Multiple Pain Sites  Yes    Pain Score  6    Pain Location  Leg    Pain Orientation  Left    Pain Descriptors / Indicators  Sore    Pain Type  Surgical pain    Pain Onset  More than a month ago    Pain Frequency  Constant    Aggravating Factors   See above.  Pain Relieving Factors  See above.         [facility] PT Assessment - 03/11/17 0001      Assessment   Medical Diagnosis  Lumbar radiculopathy.    Referring Provider  Newman Pies MD.    Onset Date/Surgical Date  -- 11/12/16.      Precautions   Precautions  Fall      Restrictions   Weight Bearing Restrictions  No      Balance Screen   Has the patient fallen in the past 6 months  Yes    How many times?  -- 2.    Has the patient had a decrease in activity level because of a fear of falling?   Yes    Is the patient reluctant to leave their home because of a fear of falling?   Yes      [place] residence      Prior Function   Level of Independence  Independent      Observation/Other Assessments   Observations  Lumbar incision is well healed.      Observation/Other Assessments-Edema    Edema  -- TED left lower leg.      Posture/Postural Control   Posture/Postural Control  Postural limitations    Postural Limitations  Rounded Shoulders;Forward head;Decreased lumbar lordosis      ROM / Strength   AROM / PROM / Strength  PROM;Strength       PROM   Overall PROM Comments  Left ankle passively to 4 degrees of dorsiflexion.      Strength   Overall Strength Comments  Left hip abduction= 4-/5; left knee and ankle strength= 4+/5; left hip abduction= 4- to 4/5; left knee flexion= 3+ to 4-/5; left ankle/foot demonstrates just minimal toe flexion at this time.      Palpation   Palpation comment  Patient c/o no significant palpable pain.      Special Tests    Special Tests  Lumbar RT PAT DTR Norm; LT 1+/4+; absent bil ACH DTR's.      Transfers   Transfers  -- Sit to stand ind.      Ambulation/Gait   Gait Comments  Ambulation with Rollator wiht left AFO and tendency of left knee to "snap back."             Objective measurements completed on examination: See above findings.                PT Short Term Goals - 03/11/17 1416      PT SHORT TERM GOAL #2   Title  Walk with straight cane in clinic 250 feet with supervision only.        PT Long Term Goals - 03/11/17 1415      PT LONG TERM GOAL #1   Title  Ind with advanced HEP.    Time  8    Period  Weeks    Status  New      PT LONG TERM GOAL #2   Title  Up/down 4 stairs with one railing.    Time  8    Period  Weeks    Status  New      PT LONG TERM GOAL #3   Title  Walk 500 feet in clinic with straight cane wiht supervision only.             Plan - 03/11/17 1335    Clinical Impression Statement  The patient presents  to OPPT with c/o right uuper gluteal and hip pain and soreness over the length of her left LE.  She currently has foot deop and is wearing an AFO and ambulating with a Rollator.  She has weakness in her LE's though most notably is a loss of active left ankle movement.  She has been in a lot of pain and cannot get comfortable or sleep well.  She is up and down a lot.  Her functional mobility is impaired due to deficits and pain.  The patient hopes to walk without a walk and sleep withou being awakened by pain.      History and  Personal Factors relevant to plan of care:  Restless leg; left foot drop.    Clinical Presentation  Evolving    Clinical Presentation due to:  Symptoms and deficits not improving.    Clinical Decision Making  Moderate    Rehab Potential  Fair    Clinical Impairments Affecting Rehab Potential  Loss of left ankle AROM.    PT Frequency  2x / week    PT Duration  8 weeks    PT Treatment/Interventions  ADLs/Self Care Home Management;Electrical Stimulation;Moist Heat;Functional mobility training;Therapeutic activities;Therapeutic exercise;Patient/family education;Neuromuscular re-education;Manual techniques;Passive range of motion    PT Next Visit Plan  Please VMS/Russian/BiPhasic to elicit left ankle dorsiflexion; Nustep; begin core and LE strengthenig; modalites and STW/M if needed.    Consulted and Agree with Plan of Care  Patient       Patient will benefit from skilled therapeutic intervention in order to improve the following deficits and impairments:  Abnormal gait, Decreased activity tolerance, Decreased mobility, Decreased range of motion, Decreased strength, Pain  Visit Diagnosis: Pain in right hip - Plan: PT plan of care cert/re-cert  Muscle weakness (generalized) - Plan: PT plan of care cert/re-cert  G-Codes - 42/35/36 1304    Functional Assessment Tool Used (Outpatient Only)  FOTO...71% limitation.    Functional Limitation  Mobility: Walking and moving around    Mobility: Walking and Moving Around Current Status [phone])  At least 60 percent but less than 80 percent impaired, limited or restricted    Mobility: Walking and Moving Around Goal Status [phone])  At least 40 percent but less than 60 percent impaired, limited or restricted        Problem List Patient Active Problem List   Diagnosis Date Noted  . Iron deficiency anemia due to chronic blood loss 03/10/2017  . Postoperative wound dehiscence 12/02/2016  . E. coli UTI   . Abnormal urinalysis   . Medication side effect    . Neuropathic pain   . History of DVT (deep vein thrombosis)   . Benign essential HTN   . RLS (restless legs syndrome)   . Urinary retention   . Hypokalemia   . Hypoalbuminemia due to protein-calorie malnutrition ([place])   . Acute blood loss anemia   . Lumbar stenosis with neurogenic claudication 11/18/2016  . Spondylolisthesis of lumbar region 11/12/2016  . Hereditary hemochromatosis ([place]) 07/28/2016  . Prothrombin gene mutation ([place]) 05/30/2013  . Right leg DVT ([place]) 05/30/2013  . Hemochromatosis 07/29/2012    Tailor Lucking, [place] MPT 03/11/2017, 2:19 PM  [facility] [address] [place], [place], [address] Phone: [phone]   Fax:  [phone]  Name: [name] MRN: [id number] Date of Birth: 1940-06-19

## 2017-03-12 DIAGNOSIS — R0602 Shortness of breath: Secondary | ICD-10-CM | POA: Diagnosis not present

## 2017-03-12 DIAGNOSIS — M7989 Other specified soft tissue disorders: Secondary | ICD-10-CM | POA: Diagnosis not present

## 2017-03-12 DIAGNOSIS — D508 Other iron deficiency anemias: Secondary | ICD-10-CM | POA: Diagnosis not present

## 2017-03-12 DIAGNOSIS — M79672 Pain in left foot: Secondary | ICD-10-CM | POA: Diagnosis not present

## 2017-03-13 ENCOUNTER — Ambulatory Visit (HOSPITAL_BASED_OUTPATIENT_CLINIC_OR_DEPARTMENT_OTHER): Payer: PPO

## 2017-03-13 VITALS — BP 139/58 | HR 62 | Temp 97.7°F | Resp 18

## 2017-03-13 DIAGNOSIS — D509 Iron deficiency anemia, unspecified: Secondary | ICD-10-CM

## 2017-03-13 DIAGNOSIS — D500 Iron deficiency anemia secondary to blood loss (chronic): Secondary | ICD-10-CM

## 2017-03-13 DIAGNOSIS — D62 Acute posthemorrhagic anemia: Secondary | ICD-10-CM

## 2017-03-13 MED ORDER — FERUMOXYTOL INJECTION 510 MG/17 ML
510.0000 mg | Freq: Once | INTRAVENOUS | Status: AC
  Administered 2017-03-13: 510 mg via INTRAVENOUS
  Filled 2017-03-13: qty 17

## 2017-03-13 MED ORDER — SODIUM CHLORIDE 0.9 % IV SOLN
Freq: Once | INTRAVENOUS | Status: AC
  Administered 2017-03-13: 10:00:00 via INTRAVENOUS

## 2017-03-13 NOTE — Patient Instructions (Signed)

## 2017-03-17 ENCOUNTER — Ambulatory Visit: Payer: PPO | Admitting: Physical Therapy

## 2017-03-17 ENCOUNTER — Encounter: Payer: Self-pay | Admitting: Physical Therapy

## 2017-03-17 DIAGNOSIS — M25551 Pain in right hip: Secondary | ICD-10-CM

## 2017-03-17 DIAGNOSIS — M6281 Muscle weakness (generalized): Secondary | ICD-10-CM

## 2017-03-17 DIAGNOSIS — R262 Difficulty in walking, not elsewhere classified: Secondary | ICD-10-CM

## 2017-03-17 NOTE — Therapy (Signed)
[facility] [place], [place], [address] Phone: [phone]   Fax:  [phone]  Physical Therapy Treatment  Patient Details  Name: [name] MRN: [id number] Date of Birth: Apr 10, 1941 Referring Provider: Newman Pies MD.   Encounter Date: 03/17/2017  PT End of Session - 03/17/17 1456    Visit Number  2    Number of Visits  16    Date for PT Re-Evaluation  05/10/17    PT Start Time  1030    PT Stop Time  1120    PT Time Calculation (min)  50 min       Past Medical History:  Diagnosis Date  . Anemia   . Arthritis   . Bell's palsy   . GERD (gastroesophageal reflux disease)   . Hemochromatosis   . History of hiatal hernia   . Hypertension   . Iron deficiency anemia due to chronic blood loss 03/10/2017  . PE (pulmonary embolism)   . Pneumonia    "walking" pneumonia  . Prothrombin gene mutation ([place]) 05/30/2013  . Restless legs   . Right leg DVT ([place]) 05/30/2013  . Stroke [facility])    found on a MRI, she's not aware otherwise    Past Surgical History:  Procedure Laterality Date  . BACK SURGERY    . COLONOSCOPY    . SHOULDER SURGERY Bilateral     There were no vitals filed for this visit.  Subjective Assessment - 03/17/17 1451    Subjective  pt arriving today using her rollator walker reporting mild lumbar pain 4/10.Marland Kitchen Pt tolerated LE strengthening L>R. Pt was issued Clam shells in sidelying as a HEP. Attempted VMS and [place] stimulation to elicit df in her L foot, but was unable to elicit a muscle twitch. Will continue with skilled PT to porgress pt's POC and functional mobility.     Pertinent History  Restless leg; stroke; shoulder surgery.    Limitations  Sitting    How long can you sit comfortably?  15 minutes.    Patient Stated Goals  I want to walk without a walker and sleep better.    Currently in Pain?  Yes    Pain Score  4     Pain Location  Back    Pain Orientation  Right;Lower    Pain  Descriptors / Indicators  Aching    Pain Type  Surgical pain    Pain Onset  More than a month ago    Multiple Pain Sites  No         [facility] PT Assessment - 03/17/17 0001      Assessment   Medical Diagnosis  Lumbar radiculopathy.      Precautions   Precautions  Fall      Restrictions   Weight Bearing Restrictions  No      Balance Screen   Has the patient fallen in the past 6 months  Yes                  [facility] Adult PT Treatment/Exercise - 03/17/17 0001      Exercises   Exercises  Knee/Hip      Knee/Hip Exercises: Supine   Quad Sets  Strengthening;Left;15 reps    Short Arc Quad Sets  Strengthening;Left;15 reps    Heel Slides  Left;10 reps    Bridges  Strengthening;Both;10 reps    Bridges Limitations  stabalizing L LE      Knee/Hip Exercises: Sidelying   Clams  Pt unable to lift L LE added to HEP      Modalities   Modalities  Teacher, English as a foreign language Location  L anterior tib    Electrical Stimulation Action  VMS, and [place] attenmpting to elicit dorsiflexion, but unable to stimulate a muscle twitch response.     Electrical Stimulation Goals  Neuromuscular facilitation             PT Education - 03/17/17 1455    Education provided  Yes    Education Details  clam shells    Person(s) Educated  Patient    Methods  Explanation;Demonstration;Handout;Verbal cues    Comprehension  Verbalized understanding;Returned demonstration;Verbal cues required;Need further instruction       PT Short Term Goals - 03/11/17 1416      PT SHORT TERM GOAL #2   Title  Walk with straight cane in clinic 250 feet with supervision only.        PT Long Term Goals - 03/17/17 1457      PT LONG TERM GOAL #1   Title  Ind with advanced HEP.    Status  On-going      PT LONG TERM GOAL #2   Title  Up/down 4 stairs with one railing.    Time  8    Period  Weeks      PT LONG TERM GOAL #3   Title  Walk 500 feet in  clinic with straight cane wiht supervision only.    Time  8    Period  Weeks    Status  New            Plan - 03/17/17 1457    Clinical Impression Statement  pt arriving today using her rollator walker reporting mild lumbar pain 4/10.Marland Kitchen Pt tolerated LE strengthening L>R. Pt was issued Clam shells in sidelying as a HEP. Attempted VMS and [place] stimulation to elicit df in her L foot, but was unable to elicit a muscle twitch. Will continue with skilled PT to porgress pt's POC and functional mobility.     Rehab Potential  Fair    Clinical Impairments Affecting Rehab Potential  Loss of left ankle AROM.    PT Frequency  2x / week    PT Duration  8 weeks    PT Treatment/Interventions  ADLs/Self Care Home Management;Electrical Stimulation;Moist Heat;Functional mobility training;Therapeutic activities;Therapeutic exercise;Patient/family education;Neuromuscular re-education;Manual techniques;Passive range of motion    PT Next Visit Plan  Please VMS/Russian/BiPhasic to elicit left ankle dorsiflexion; Nustep; begin core and LE strengthenig; modalites and STW/M if needed.    Consulted and Agree with Plan of Care  Patient       Patient will benefit from skilled therapeutic intervention in order to improve the following deficits and impairments:  Abnormal gait, Decreased activity tolerance, Decreased mobility, Decreased range of motion, Decreased strength, Pain  Visit Diagnosis: Pain in right hip  Muscle weakness (generalized)  Difficulty in walking, not elsewhere classified     Problem List Patient Active Problem List   Diagnosis Date Noted  . Iron deficiency anemia due to chronic blood loss 03/10/2017  . Postoperative wound dehiscence 12/02/2016  . E. coli UTI   . Abnormal urinalysis   . Medication side effect   . Neuropathic pain   . History of DVT (deep vein thrombosis)   . Benign essential HTN   . RLS (restless legs syndrome)   . Urinary retention   . Hypokalemia   .  Hypoalbuminemia due to protein-calorie malnutrition ([place])   . Acute blood loss anemia   . Lumbar stenosis with neurogenic claudication 11/18/2016  . Spondylolisthesis of lumbar region 11/12/2016  . Hereditary hemochromatosis ([place]) 07/28/2016  . Prothrombin gene mutation ([place]) 05/30/2013  . Right leg DVT ([place]) 05/30/2013  . Hemochromatosis 07/29/2012    Oretha Caprice, MPT 03/17/2017, 2:59 PM  [facility] [address] [place], [place], [address] Phone: [phone]   Fax:  [phone]  Name: SHAVONE NEVERS MRN: [id number] Date of Birth: July 30, 1940

## 2017-03-24 ENCOUNTER — Ambulatory Visit: Payer: PPO | Admitting: Physical Therapy

## 2017-03-24 DIAGNOSIS — M25551 Pain in right hip: Secondary | ICD-10-CM

## 2017-03-24 DIAGNOSIS — M6281 Muscle weakness (generalized): Secondary | ICD-10-CM

## 2017-03-24 DIAGNOSIS — R262 Difficulty in walking, not elsewhere classified: Secondary | ICD-10-CM

## 2017-03-24 NOTE — Therapy (Signed)
[facility] [place], [place], [address] Phone: [phone]   Fax:  [phone]  Physical Therapy Treatment  Patient Details  Name: [name] MRN: [id number] Date of Birth: 06-23-1940 Referring Provider: Newman Pies MD.   Encounter Date: 03/24/2017  PT End of Session - 03/24/17 1323    Visit Number  3    Number of Visits  16    Date for PT Re-Evaluation  05/10/17    PT Start Time  2297 Delayed arrival to treatment area f/b discussing need for a custom fit orthotic as patient cannot wear current one due to pain.      PT Stop Time  1114    PT Time Calculation (min)  34 min    Activity Tolerance  Patient tolerated treatment well    Behavior During Therapy  WFL for tasks assessed/performed       Past Medical History:  Diagnosis Date  . Anemia   . Arthritis   . Bell's palsy   . GERD (gastroesophageal reflux disease)   . Hemochromatosis   . History of hiatal hernia   . Hypertension   . Iron deficiency anemia due to chronic blood loss 03/10/2017  . PE (pulmonary embolism)   . Pneumonia    "walking" pneumonia  . Prothrombin gene mutation ([place]) 05/30/2013  . Restless legs   . Right leg DVT ([place]) 05/30/2013  . Stroke [facility])    found on a MRI, she's not aware otherwise    Past Surgical History:  Procedure Laterality Date  . BACK SURGERY    . COLONOSCOPY    . SHOULDER SURGERY Bilateral     There were no vitals filed for this visit.                             PT Short Term Goals - 03/11/17 1416      PT SHORT TERM GOAL #2   Title  Walk with straight cane in clinic 250 feet with supervision only.        PT Long Term Goals - 03/17/17 1457      PT LONG TERM GOAL #1   Title  Ind with advanced HEP.    Status  On-going      PT LONG TERM GOAL #2   Title  Up/down 4 stairs with one railing.    Time  8    Period  Weeks      PT LONG TERM GOAL #3   Title  Walk 500 feet in clinic  with straight cane wiht supervision only.    Time  8    Period  Weeks    Status  New            Plan - 03/24/17 1329    Clinical Impression Statement  Treatment delayed today due to discussing need for custom fit orthotic.  Patient cannot wear current orthotic and she states it feels like its "breaking" her big toe.  Instructed patient in use of TENS today for home use.    PT Next Visit Plan  Try VMS left of L4 to S1.    Consulted and Agree with Plan of Care  Patient       Patient will benefit from skilled therapeutic intervention in order to improve the following deficits and impairments:  Abnormal gait, Decreased activity tolerance, Decreased mobility, Decreased range of motion, Decreased strength, Pain  Visit Diagnosis: Pain in right hip  Muscle weakness (generalized)  Difficulty in walking, not elsewhere classified     Problem List Patient Active Problem List   Diagnosis Date Noted  . Iron deficiency anemia due to chronic blood loss 03/10/2017  . Postoperative wound dehiscence 12/02/2016  . E. coli UTI   . Abnormal urinalysis   . Medication side effect   . Neuropathic pain   . History of DVT (deep vein thrombosis)   . Benign essential HTN   . RLS (restless legs syndrome)   . Urinary retention   . Hypokalemia   . Hypoalbuminemia due to protein-calorie malnutrition ([place])   . Acute blood loss anemia   . Lumbar stenosis with neurogenic claudication 11/18/2016  . Spondylolisthesis of lumbar region 11/12/2016  . Hereditary hemochromatosis ([place]) 07/28/2016  . Prothrombin gene mutation ([place]) 05/30/2013  . Right leg DVT ([place]) 05/30/2013  . Hemochromatosis 07/29/2012   Treatment:  VMS to right tib ant x 15 minutes with 10 sec on and 10 sec off cycle.  Patient also brought in TENS unit.  Instructed patient in use, wear times, and 5 different protocols.  Performed on patient's non-affected LE for instruction.  Patient demonstrated excellent knowledge and will now start  using at home.  Donta Fuster, [place] MPT 03/24/2017, 1:31 PM  [facility] [address] [place], [place], [address] Phone: [phone]   Fax:  [phone]  Name: [name] MRN: [id number] Date of Birth: [birth year]

## 2017-03-31 ENCOUNTER — Ambulatory Visit: Payer: PPO | Attending: Neurosurgery | Admitting: *Deleted

## 2017-03-31 DIAGNOSIS — M6281 Muscle weakness (generalized): Secondary | ICD-10-CM

## 2017-03-31 DIAGNOSIS — M25551 Pain in right hip: Secondary | ICD-10-CM

## 2017-03-31 DIAGNOSIS — R262 Difficulty in walking, not elsewhere classified: Secondary | ICD-10-CM | POA: Diagnosis not present

## 2017-03-31 NOTE — Therapy (Signed)
[facility] [place], [place], [address] Phone: [phone]   Fax:  [phone]  Physical Therapy Treatment  Patient Details  Name: [name] MRN: [id number] Date of Birth: [birth year] Referring Provider: Newman Pies MD.   Encounter Date: 03/31/2017  PT End of Session - 03/31/17 1052    Visit Number  4    Number of Visits  16    Date for PT Re-Evaluation  05/10/17    PT Start Time  6314    PT Stop Time  1125    PT Time Calculation (min)  43 min       Past Medical History:  Diagnosis Date  . Anemia   . Arthritis   . Bell's palsy   . GERD (gastroesophageal reflux disease)   . Hemochromatosis   . History of hiatal hernia   . Hypertension   . Iron deficiency anemia due to chronic blood loss 03/10/2017  . PE (pulmonary embolism)   . Pneumonia    "walking" pneumonia  . Prothrombin gene mutation ([place]) 05/30/2013  . Restless legs   . Right leg DVT ([place]) 05/30/2013  . Stroke [facility])    found on a MRI, she's not aware otherwise    Past Surgical History:  Procedure Laterality Date  . BACK SURGERY    . COLONOSCOPY    . SHOULDER SURGERY Bilateral     There were no vitals filed for this visit.  Subjective Assessment - 03/31/17 1050    Subjective  Pt arrived 12 mins late today, but doing a little better    Pertinent History  Restless leg; stroke; shoulder surgery.    Limitations  Sitting    How long can you sit comfortably?  15 minutes.    Patient Stated Goals  I want to walk without a walker and sleep better.    Currently in Pain?  Yes    Pain Score  4     Pain Location  Back    Pain Orientation  Right;Lower    Pain Descriptors / Indicators  Aching    Pain Type  Surgical pain    Pain Onset  More than a month ago                      [facility] Adult PT Treatment/Exercise - 03/31/17 0001      Exercises   Exercises  Knee/Hip;Lumbar      Lumbar Exercises: Supine   Ab Set  20 reps    Bent Knee  Raise  20 reps;3 seconds      Knee/Hip Exercises: Supine   Quad Sets  Strengthening;Left;15 reps    Short Arc Quad Sets  Strengthening;Left;15 reps      Modalities   Modalities  Teacher, English as a foreign language Location  L anterior tib and LT LB L1 to S1    Electrical Stimulation Action  VMS to both areas at the same time in attempts to facilitate DF LT foot. 10 secs on/off x 20 mins    Electrical Stimulation Goals  Neuromuscular facilitation               PT Short Term Goals - 03/11/17 1416      PT SHORT TERM GOAL #2   Title  Walk with straight cane in clinic 250 feet with supervision only.        PT Long Term Goals - 03/17/17 1457  PT LONG TERM GOAL #1   Title  Ind with advanced HEP.    Status  On-going      PT LONG TERM GOAL #2   Title  Up/down 4 stairs with one railing.    Time  8    Period  Weeks      PT LONG TERM GOAL #3   Title  Walk 500 feet in clinic with straight cane wiht supervision only.    Time  8    Period  Weeks    Status  New            Plan - 03/31/17 1344    Clinical Impression Statement  Pt arrived 12 mins late ambulating with Rollator. She continues to have weakness in LT foot DF motion. Core activation exs were added today to facilitate LB stability and VMS was appplied to LT LB paras from L1 to s1 as well as LT Anterior Tib x 20 mins to faciltate DF . Pt tolerated Rx well , but minimal muscle activation.    Rehab Potential  Fair    Clinical Impairments Affecting Rehab Potential  Loss of left ankle AROM.    PT Frequency  2x / week    PT Duration  8 weeks    PT Treatment/Interventions  ADLs/Self Care Home Management;Electrical Stimulation;Moist Heat;Functional mobility training;Therapeutic activities;Therapeutic exercise;Patient/family education;Neuromuscular re-education;Manual techniques;Passive range of motion    PT Next Visit Plan  Try VMS left of L4 to S1.    Consulted and Agree  with Plan of Care  Patient       Patient will benefit from skilled therapeutic intervention in order to improve the following deficits and impairments:  Abnormal gait, Decreased activity tolerance, Decreased mobility, Decreased range of motion, Decreased strength, Pain  Visit Diagnosis: Pain in right hip  Muscle weakness (generalized)  Difficulty in walking, not elsewhere classified     Problem List Patient Active Problem List   Diagnosis Date Noted  . Iron deficiency anemia due to chronic blood loss 03/10/2017  . Postoperative wound dehiscence 12/02/2016  . E. coli UTI   . Abnormal urinalysis   . Medication side effect   . Neuropathic pain   . History of DVT (deep vein thrombosis)   . Benign essential HTN   . RLS (restless legs syndrome)   . Urinary retention   . Hypokalemia   . Hypoalbuminemia due to protein-calorie malnutrition ([place])   . Acute blood loss anemia   . Lumbar stenosis with neurogenic claudication 11/18/2016  . Spondylolisthesis of lumbar region 11/12/2016  . Hereditary hemochromatosis ([place]) 07/28/2016  . Prothrombin gene mutation ([place]) 05/30/2013  . Right leg DVT ([place]) 05/30/2013  . Hemochromatosis 07/29/2012    [name],CHRIS , PTA 03/31/2017, 1:58 PM  [facility] [address] [place], [place], [address] Phone: [phone]   Fax:  [phone]  Name: [name] MRN: [id number] Date of Birth: 1940-07-03

## 2017-04-02 ENCOUNTER — Encounter: Payer: PPO | Admitting: Physical Therapy

## 2017-04-03 DIAGNOSIS — M48062 Spinal stenosis, lumbar region with neurogenic claudication: Secondary | ICD-10-CM | POA: Diagnosis not present

## 2017-04-13 ENCOUNTER — Ambulatory Visit (HOSPITAL_BASED_OUTPATIENT_CLINIC_OR_DEPARTMENT_OTHER): Payer: PPO | Admitting: Hematology & Oncology

## 2017-04-13 ENCOUNTER — Other Ambulatory Visit: Payer: PPO

## 2017-04-13 ENCOUNTER — Other Ambulatory Visit: Payer: Self-pay

## 2017-04-13 VITALS — BP 152/73 | HR 69 | Temp 97.9°F | Resp 18 | Wt 179.0 lb

## 2017-04-13 DIAGNOSIS — Z7982 Long term (current) use of aspirin: Secondary | ICD-10-CM | POA: Diagnosis not present

## 2017-04-13 DIAGNOSIS — D500 Iron deficiency anemia secondary to blood loss (chronic): Secondary | ICD-10-CM

## 2017-04-13 DIAGNOSIS — I82501 Chronic embolism and thrombosis of unspecified deep veins of right lower extremity: Secondary | ICD-10-CM | POA: Diagnosis not present

## 2017-04-13 LAB — CBC WITH DIFFERENTIAL (CANCER CENTER ONLY)
BASO#: 0 10*3/uL (ref 0.0–0.2)
BASO%: 0.3 % (ref 0.0–2.0)
EOS%: 4.6 % (ref 0.0–7.0)
Eosinophils Absolute: 0.3 10*3/uL (ref 0.0–0.5)
HEMATOCRIT: 37 % (ref 34.8–46.6)
HGB: 11.7 g/dL (ref 11.6–15.9)
LYMPH#: 1.4 10*3/uL (ref 0.9–3.3)
LYMPH%: 21.5 % (ref 14.0–48.0)
MCH: 26.7 pg (ref 26.0–34.0)
MCHC: 31.6 g/dL — ABNORMAL LOW (ref 32.0–36.0)
MCV: 84 fL (ref 81–101)
MONO#: 0.5 10*3/uL (ref 0.1–0.9)
MONO%: 7.7 % (ref 0.0–13.0)
NEUT#: 4.2 10*3/uL (ref 1.5–6.5)
NEUT%: 65.9 % (ref 39.6–80.0)
Platelets: 255 10*3/uL (ref 145–400)
RBC: 4.39 10*6/uL (ref 3.70–5.32)
RDW: 18.7 % — AB (ref 11.1–15.7)
WBC: 6.4 10*3/uL (ref 3.9–10.0)

## 2017-04-13 LAB — CMP (CANCER CENTER ONLY)
ALT(SGPT): 27 U/L (ref 10–47)
AST: 27 U/L (ref 11–38)
Albumin: 3.8 g/dL (ref 3.3–5.5)
Alkaline Phosphatase: 103 U/L — ABNORMAL HIGH (ref 26–84)
BUN, Bld: 18 mg/dL (ref 7–22)
CALCIUM: 10.1 mg/dL (ref 8.0–10.3)
CHLORIDE: 97 meq/L — AB (ref 98–108)
CO2: 32 meq/L (ref 18–33)
Creat: 1 mg/dl (ref 0.6–1.2)
GLUCOSE: 105 mg/dL (ref 73–118)
Potassium: 3.3 mEq/L (ref 3.3–4.7)
Sodium: 144 mEq/L (ref 128–145)
Total Bilirubin: 0.5 mg/dl (ref 0.20–1.60)
Total Protein: 7.8 g/dL (ref 6.4–8.1)

## 2017-04-13 LAB — IRON AND TIBC
%SAT: 14 % — ABNORMAL LOW (ref 21–57)
Iron: 36 ug/dL — ABNORMAL LOW (ref 41–142)
TIBC: 250 ug/dL (ref 236–444)
UIBC: 215 ug/dL (ref 120–384)

## 2017-04-13 LAB — FERRITIN: Ferritin: 53 ng/ml (ref 9–269)

## 2017-04-13 NOTE — Progress Notes (Signed)
Hematology and Oncology Follow Up Visit  [name] [id number] [birth year] 76 y.o. 04/13/2017   Principle Diagnosis:  DVT of the right leg Prothrombin II gene mutation Hemachromatosis (homozygous for C282Y Mutation) Past history of right lower extremity DVT/PE Iron deficiency anemia secondary to blood loss.  Current Therapy:   Aspirin 162 mg PO daily IV iron-Feraheme dose given in 02/2017    Interim History:  [name] is here today for a follow-up.  She is feeling a lot better.  We gave her a dose of IV iron back in November.  We saw her, she did not look all that great.  She looked fatigued.  She looked "worn out."  Her iron levels showed a ferritin of only 43 with iron saturation of 10%.  In spite of the hemochromatosis, we went ahead and gave her a dose of IV iron.  This helped her.  She feels better.  She has more energy.  She was able to enjoy Thanksgiving.  She is still using the rolling walker.  She still has the foot drop with her left foot.  She has had no cough or shortness of breath.  She has had no fever.  She has had no nausea or vomiting.  Overall, I would say performance status is ECOG 1.  Medications:  Allergies as of 04/13/2017      Reactions   Penicillins Anaphylaxis   Pt was 51 or 76 years old  Has patient had a PCN reaction causing immediate rash, facial/tongue/throat swelling, SOB or lightheadedness with hypotension: Yes Has patient had a PCN reaction causing severe rash involving mucus membranes or skin necrosis: Unknown Has patient had a PCN reaction that required hospitalization: No Has patient had a PCN reaction occurring within the last 10 years: No If all of the above answers are "NO", then may proceed with Cephalosporin use.   Sulfa Antibiotics Other (See Comments)   unknown   Morphine And Related Other (See Comments)   Twitching in large doses       Medication List        Accurate as of 04/13/17 12:59 PM. Always use your most recent  med list.          amLODipine 5 MG tablet Commonly known as:  NORVASC Take 5 mg by mouth daily.   amLODipine 10 MG tablet Commonly known as:  NORVASC   ARTIFICIAL TEARS OP Place 1 drop into both eyes 3 (three) times daily as needed (dry eyes).   Biotin 5000 MCG Tabs Take 5,000 mcg by mouth daily.   CALCIUM 600+D PO Take 1 tablet by mouth 2 (two) times daily.   citalopram 20 MG tablet Commonly known as:  CELEXA Take 20 mg by mouth at bedtime.   colchicine 0.6 MG tablet Take 1 tablet (0.6 mg total) by mouth daily. Gout flare up.   cyclobenzaprine 10 MG tablet Commonly known as:  FLEXERIL Take 0.5 tablets (5 mg total) by mouth 2 (two) times daily as needed for muscle spasms.   doxycycline 100 MG tablet Commonly known as:  VIBRA-TABS Take 1 tablet (100 mg total) by mouth every 12 (twelve) hours.   ferrous sulfate 325 (65 FE) MG tablet Take 325 mg 2 (two) times daily with a meal by mouth.   fish oil-omega-3 fatty acids 1000 MG capsule Take 1 g by mouth daily.   furosemide 20 MG tablet Commonly known as:  LASIX   hydrochlorothiazide 25 MG tablet Commonly known as:  HYDRODIURIL Take 25 mg  by mouth daily.   lidocaine 5 % Commonly known as:  LIDODERM Place 1 patch onto the skin daily. Apply to back at 8 am and remove at 8 pm daily (has to be off for 12 hours daily) Can be purchased over the counter.   multivitamin with minerals Tabs tablet Take 1 tablet by mouth daily.   oxyCODONE-acetaminophen 5-325 MG tablet Commonly known as:  PERCOCET/ROXICET   pantoprazole 40 MG tablet Commonly known as:  PROTONIX Take 40 mg by mouth daily before supper.   potassium chloride SA 20 MEQ tablet Commonly known as:  K-DUR,KLOR-CON Take 1 tablet (20 mEq total) by mouth 2 (two) times daily.   pramipexole 0.5 MG tablet Commonly known as:  MIRAPEX Take 0.5 mg by mouth every evening.   propranolol 10 MG tablet Commonly known as:  INDERAL   RESTASIS 0.05 % ophthalmic  emulsion Generic drug:  cycloSPORINE Place 1 drop into both eyes 2 (two) times daily.   temazepam 15 MG capsule Commonly known as:  RESTORIL Take 1 capsule (15 mg total) by mouth at bedtime.   Turmeric Curcumin 500 MG Caps Take 500 mg by mouth at bedtime.   Vitamin D 2000 units tablet Take 2,000 Units by mouth 2 (two) times daily.       Allergies:  Allergies  Allergen Reactions  . Penicillins Anaphylaxis    Pt was 62 or 76 years old  Has patient had a PCN reaction causing immediate rash, facial/tongue/throat swelling, SOB or lightheadedness with hypotension: Yes Has patient had a PCN reaction causing severe rash involving mucus membranes or skin necrosis: Unknown Has patient had a PCN reaction that required hospitalization: No Has patient had a PCN reaction occurring within the last 10 years: No If all of the above answers are "NO", then may proceed with Cephalosporin use.   . Sulfa Antibiotics Other (See Comments)    unknown  . Morphine And Related Other (See Comments)    Twitching in large doses     Past Medical History, Surgical history, Social history, and Family History were reviewed and updated.  Review of Systems: As stated in the interim history  Physical Exam:  weight is 179 lb (81.2 kg). Her oral temperature is 97.9 F (36.6 C). Her blood pressure is 152/73 (abnormal) and her pulse is 69. Her respiration is 18 and oxygen saturation is 99%.   Wt Readings from Last 3 Encounters:  04/13/17 179 lb (81.2 kg)  03/09/17 181 lb (82.1 kg)  02/21/17 180 lb (81.6 kg)    I examined [name].  The findings on my examination are noted below:   Well-developed and well-nourished white female in no obvious distress.  Head and neck exam shows no ocular or oral lesions.  She has no palpable cervical or supraclavicular lymph nodes.  Lungs are clear bilaterally.  Cardiac exam regular rate and rhythm with no murmurs, rubs or bruits.  Abdomen is soft.  She has good bowel  sounds.  There is no fluid wave.  There is no palpable liver or spleen tip.  Back exam shows the laminectomy scar in the lumbar spine that is healed.  Extremities shows the foot drop with the left leg.  She has a brace on the left leg to prevent foot drop.  Right leg is without obvious venous thrombus.  There is no venous cord that is palpable in the right leg.  She has a negative Homans sign bilaterally.  She does have a compression stocking on the left  leg.  Neurological exam shows the foot drop with the left foot.  Skin exam shows no rashes, ecchymoses or petechia.    Lab Results  Component Value Date   WBC 6.4 04/13/2017   HGB 11.7 04/13/2017   HCT 37.0 04/13/2017   MCV 84 04/13/2017   PLT 255 04/13/2017   Lab Results  Component Value Date   FERRITIN 43 03/09/2017   IRON 32 (L) 03/09/2017   TIBC 320 03/09/2017   UIBC 288 03/09/2017   IRONPCTSAT 10 (L) 03/09/2017   Lab Results  Component Value Date   RETICCTPCT 0.9 05/11/2014   RBC 4.39 04/13/2017   RETICCTABS 39.7 05/11/2014   No results found for: KPAFRELGTCHN, LAMBDASER, KAPLAMBRATIO No results found for: IGGSERUM, IGA, IGMSERUM No results found for: Odetta Pink, SPEI   Chemistry      Component Value Date/Time   NA 144 04/13/2017 1134   NA 142 07/28/2016 1144   K 3.3 04/13/2017 1134   K 3.6 07/28/2016 1144   CL 97 (L) 04/13/2017 1134   CO2 32 04/13/2017 1134   CO2 33 (H) 07/28/2016 1144   BUN 18 04/13/2017 1134   BUN 22.6 07/28/2016 1144   CREATININE 1.0 04/13/2017 1134   CREATININE 1.0 07/28/2016 1144      Component Value Date/Time   CALCIUM 10.1 04/13/2017 1134   CALCIUM 9.5 07/28/2016 1144   ALKPHOS 103 (H) 04/13/2017 1134   ALKPHOS 84 07/28/2016 1144   AST 27 04/13/2017 1134   AST 24 07/28/2016 1144   ALT 27 04/13/2017 1134   ALT 17 07/28/2016 1144   BILITOT 0.50 04/13/2017 1134   BILITOT 0.41 07/28/2016 1144     Impression and Plan: Ms. Gerlich is  76 yo white female with a chronic DVT of the right leg. She is on aspirin.  The iron that we gave her clearly helped her.  She feels better.   Again, we have to watch out with her hemochromatosis.  We have to balance her iron levels with her hemoglobin and her quality of life.  We will get her back now in 3 months.  I would like to get her through the winter.  Hopefully, she will be able to come in here without the walker.     Volanda Napoleon, MD 12/17/201812:59 PM

## 2017-04-14 ENCOUNTER — Encounter: Payer: Self-pay | Admitting: Physical Therapy

## 2017-04-14 ENCOUNTER — Ambulatory Visit: Payer: PPO | Admitting: Physical Therapy

## 2017-04-14 DIAGNOSIS — M25551 Pain in right hip: Secondary | ICD-10-CM

## 2017-04-14 DIAGNOSIS — M6281 Muscle weakness (generalized): Secondary | ICD-10-CM

## 2017-04-14 DIAGNOSIS — R262 Difficulty in walking, not elsewhere classified: Secondary | ICD-10-CM

## 2017-04-14 LAB — RETICULOCYTES: Reticulocyte Count: 0.9 % (ref 0.6–2.6)

## 2017-04-14 NOTE — Therapy (Addendum)
[facility] [place], [place], [address] Phone: [phone]   Fax:  [phone]  Physical Therapy Treatment  Patient Details  Name: [name] MRN: [id number] Date of Birth: 05/16/1940 Referring Provider: Newman Pies MD.   Encounter Date: 04/14/2017  PT End of Session - 04/14/17 1353    Visit Number  5    Number of Visits  16    Date for PT Re-Evaluation  05/10/17    PT Start Time  1351    PT Stop Time  1445    PT Time Calculation (min)  54 min    Activity Tolerance  Patient tolerated treatment well    Behavior During Therapy  [facility] for tasks assessed/performed       Past Medical History:  Diagnosis Date  . Anemia   . Arthritis   . Bell's palsy   . GERD (gastroesophageal reflux disease)   . Hemochromatosis   . History of hiatal hernia   . Hypertension   . Iron deficiency anemia due to chronic blood loss 03/10/2017  . PE (pulmonary embolism)   . Pneumonia    "walking" pneumonia  . Prothrombin gene mutation ([place]) 05/30/2013  . Restless legs   . Right leg DVT ([place]) 05/30/2013  . Stroke [facility])    found on a MRI, she's not aware otherwise    Past Surgical History:  Procedure Laterality Date  . BACK SURGERY    . COLONOSCOPY    . SHOULDER SURGERY Bilateral     There were no vitals filed for this visit.  Subjective Assessment - 04/14/17 1351    Subjective  Reports that her back is good upon arrival and every once in a while will have pain in low back or right low back. Reports that she still has not heard about a new orthotic for her L foot.    Pertinent History  Restless leg; stroke; shoulder surgery.    Limitations  Sitting    How long can you sit comfortably?  15 minutes.    Patient Stated Goals  I want to walk without a walker and sleep better.    Currently in Pain?  No/denies         [facility] PT Assessment - 04/14/17 0001      Assessment   Medical Diagnosis  Lumbar radiculopathy.    Next MD Visit   04/2017      Precautions   Precautions  Fall      Restrictions   Weight Bearing Restrictions  No                  [facility] Adult PT Treatment/Exercise - 04/14/17 0001      Lumbar Exercises: Aerobic   Stationary Bike  Nustep L4 x16 min      Lumbar Exercises: Seated   Sit to Stand  10 reps      Lumbar Exercises: Supine   Ab Set  15 reps;5 seconds    Clam  20 reps red theraband    Bent Knee Raise  20 reps;3 seconds    Other Supine Lumbar Exercises  VMS to L ankle DF to assist with neuro re-ed of DF      Knee/Hip Exercises: Standing   Hip Flexion  AROM;Both;2 sets;10 reps;Knee bent    Hip Abduction  AROM;Both;2 sets;10 reps;Knee straight      Knee/Hip Exercises: Supine   Short Arc Quad Sets  Strengthening;Both;20 reps      Modalities   Modalities  Teacher, English as a foreign language Location  L ankle DF    Electrical Stimulation Action  VMS with assisted DF    Electrical Stimulation Parameters  10/10, 5 sec ramp, 50% cycle x10 min    Electrical Stimulation Goals  Neuromuscular facilitation               PT Short Term Goals - 03/11/17 1416      PT SHORT TERM GOAL #2   Title  Walk with straight cane in clinic 250 feet with supervision only.        PT Long Term Goals - 03/17/17 1457      PT LONG TERM GOAL #1   Title  Ind with advanced HEP.    Status  On-going      PT LONG TERM GOAL #2   Title  Up/down 4 stairs with one railing.    Time  8    Period  Weeks      PT LONG TERM GOAL #3   Title  Walk 500 feet in clinic with straight cane wiht supervision only.    Time  8    Period  Weeks    Status  New            Plan - 04/14/17 1509    Clinical Impression Statement  Patient tolerated today's treatment well with only reports of not hearing from regarding new AFO. Patient guided through more standing exercises for hip strengthening with trendelenberg weakness noted in RLE SLS which patient required  more UE assist in parallel bars. VMS to L ankle DFs continued with assist into DF by PTA secondary to weakness. Patient able to feel electrical stimulation but unable to translate into ankle dorsiflexion during treatment. Patient able to complete other supine core activation exercises well with focus on core activation during the exercise. Normal electrical stimulation response noted today in clinic upon removal of the modality.    Rehab Potential  Fair    Clinical Impairments Affecting Rehab Potential  Loss of left ankle AROM.    PT Frequency  2x / week    PT Duration  8 weeks    PT Treatment/Interventions  ADLs/Self Care Home Management;Electrical Stimulation;Moist Heat;Functional mobility training;Therapeutic activities;Therapeutic exercise;Patient/family education;Neuromuscular re-education;Manual techniques;Passive range of motion    PT Next Visit Plan  Try VMS left of L4 to S1.    Consulted and Agree with Plan of Care  Patient       Patient will benefit from skilled therapeutic intervention in order to improve the following deficits and impairments:  Abnormal gait, Decreased activity tolerance, Decreased mobility, Decreased range of motion, Decreased strength, Pain  Visit Diagnosis: Pain in right hip  Muscle weakness (generalized)  Difficulty in walking, not elsewhere classified     Problem List Patient Active Problem List   Diagnosis Date Noted  . Iron deficiency anemia due to chronic blood loss 03/10/2017  . Postoperative wound dehiscence 12/02/2016  . E. coli UTI   . Abnormal urinalysis   . Medication side effect   . Neuropathic pain   . History of DVT (deep vein thrombosis)   . Benign essential HTN   . RLS (restless legs syndrome)   . Urinary retention   . Hypokalemia   . Hypoalbuminemia due to protein-calorie malnutrition ([place])   . Acute blood loss anemia   . Lumbar stenosis with neurogenic claudication 11/18/2016  . Spondylolisthesis of lumbar region 11/12/2016  .  Hereditary hemochromatosis ([place])  07/28/2016  . Prothrombin gene mutation ([place]) 05/30/2013  . Right leg DVT ([place]) 05/30/2013  . Hemochromatosis 07/29/2012    Standley Brooking, PTA 04/14/2017, 3:17 PM  [facility] [address] [place], [place], [address] Phone: [phone]   Fax:  [phone]  Name: KENSEY LUEPKE MRN: [id number] Date of Birth: 1940/06/29  PHYSICAL THERAPY DISCHARGE SUMMARY  Visits from Start of Care: 5.  Current functional level related to goals / functional outcomes: See above.   Remaining deficits: See below.   Education / Equipment: HEP. Plan: Patient agrees to discharge.  Patient goals were not met. Patient is being discharged due to not returning since the last visit.  ?????          Mali Applegate MPT

## 2017-05-04 DIAGNOSIS — N951 Menopausal and female climacteric states: Secondary | ICD-10-CM | POA: Diagnosis not present

## 2017-05-04 DIAGNOSIS — N183 Chronic kidney disease, stage 3 (moderate): Secondary | ICD-10-CM | POA: Diagnosis not present

## 2017-05-04 DIAGNOSIS — D508 Other iron deficiency anemias: Secondary | ICD-10-CM | POA: Diagnosis not present

## 2017-05-04 DIAGNOSIS — G250 Essential tremor: Secondary | ICD-10-CM | POA: Diagnosis not present

## 2017-05-04 DIAGNOSIS — G259 Extrapyramidal and movement disorder, unspecified: Secondary | ICD-10-CM | POA: Diagnosis not present

## 2017-05-04 DIAGNOSIS — M1A0720 Idiopathic chronic gout, left ankle and foot, without tophus (tophi): Secondary | ICD-10-CM | POA: Diagnosis not present

## 2017-05-04 DIAGNOSIS — E7800 Pure hypercholesterolemia, unspecified: Secondary | ICD-10-CM | POA: Diagnosis not present

## 2017-05-04 DIAGNOSIS — G4700 Insomnia, unspecified: Secondary | ICD-10-CM | POA: Diagnosis not present

## 2017-05-04 DIAGNOSIS — I10 Essential (primary) hypertension: Secondary | ICD-10-CM | POA: Diagnosis not present

## 2017-05-04 DIAGNOSIS — M48062 Spinal stenosis, lumbar region with neurogenic claudication: Secondary | ICD-10-CM | POA: Diagnosis not present

## 2017-05-26 DIAGNOSIS — I10 Essential (primary) hypertension: Secondary | ICD-10-CM | POA: Diagnosis not present

## 2017-05-26 DIAGNOSIS — Z6832 Body mass index (BMI) 32.0-32.9, adult: Secondary | ICD-10-CM | POA: Diagnosis not present

## 2017-05-26 DIAGNOSIS — M4316 Spondylolisthesis, lumbar region: Secondary | ICD-10-CM | POA: Diagnosis not present

## 2017-06-02 DIAGNOSIS — M21372 Foot drop, left foot: Secondary | ICD-10-CM | POA: Diagnosis not present

## 2017-06-04 DIAGNOSIS — M48062 Spinal stenosis, lumbar region with neurogenic claudication: Secondary | ICD-10-CM | POA: Diagnosis not present

## 2017-06-30 DIAGNOSIS — N3000 Acute cystitis without hematuria: Secondary | ICD-10-CM | POA: Diagnosis not present

## 2017-06-30 DIAGNOSIS — R251 Tremor, unspecified: Secondary | ICD-10-CM | POA: Diagnosis not present

## 2017-06-30 DIAGNOSIS — M7041 Prepatellar bursitis, right knee: Secondary | ICD-10-CM | POA: Diagnosis not present

## 2017-07-02 ENCOUNTER — Emergency Department (HOSPITAL_BASED_OUTPATIENT_CLINIC_OR_DEPARTMENT_OTHER)
Admission: EM | Admit: 2017-07-02 | Discharge: 2017-07-02 | Disposition: A | Discharge status: Home / Self Care | Payer: PPO | Attending: Emergency Medicine | Admitting: Emergency Medicine

## 2017-07-02 ENCOUNTER — Other Ambulatory Visit: Payer: Self-pay

## 2017-07-02 ENCOUNTER — Encounter (HOSPITAL_BASED_OUTPATIENT_CLINIC_OR_DEPARTMENT_OTHER): Payer: Self-pay | Admitting: *Deleted

## 2017-07-02 ENCOUNTER — Emergency Department (HOSPITAL_BASED_OUTPATIENT_CLINIC_OR_DEPARTMENT_OTHER): Payer: PPO

## 2017-07-02 DIAGNOSIS — Y939 Activity, unspecified: Secondary | ICD-10-CM | POA: Insufficient documentation

## 2017-07-02 DIAGNOSIS — S46911A Strain of unspecified muscle, fascia and tendon at shoulder and upper arm level, right arm, initial encounter: Secondary | ICD-10-CM

## 2017-07-02 DIAGNOSIS — S83412A Sprain of medial collateral ligament of left knee, initial encounter: Secondary | ICD-10-CM | POA: Diagnosis not present

## 2017-07-02 DIAGNOSIS — I10 Essential (primary) hypertension: Secondary | ICD-10-CM | POA: Diagnosis not present

## 2017-07-02 DIAGNOSIS — M25462 Effusion, left knee: Secondary | ICD-10-CM | POA: Diagnosis not present

## 2017-07-02 DIAGNOSIS — Y929 Unspecified place or not applicable: Secondary | ICD-10-CM | POA: Insufficient documentation

## 2017-07-02 DIAGNOSIS — Z79899 Other long term (current) drug therapy: Secondary | ICD-10-CM | POA: Insufficient documentation

## 2017-07-02 DIAGNOSIS — M25511 Pain in right shoulder: Secondary | ICD-10-CM | POA: Diagnosis not present

## 2017-07-02 DIAGNOSIS — S4981XA Other specified injuries of right shoulder and upper arm, initial encounter: Secondary | ICD-10-CM | POA: Diagnosis present

## 2017-07-02 DIAGNOSIS — W010XXA Fall on same level from slipping, tripping and stumbling without subsequent striking against object, initial encounter: Secondary | ICD-10-CM | POA: Insufficient documentation

## 2017-07-02 DIAGNOSIS — Y999 Unspecified external cause status: Secondary | ICD-10-CM | POA: Insufficient documentation

## 2017-07-02 DIAGNOSIS — Z8673 Personal history of transient ischemic attack (TIA), and cerebral infarction without residual deficits: Secondary | ICD-10-CM | POA: Insufficient documentation

## 2017-07-02 DIAGNOSIS — M48062 Spinal stenosis, lumbar region with neurogenic claudication: Secondary | ICD-10-CM | POA: Diagnosis not present

## 2017-07-02 MED ORDER — OXYCODONE-ACETAMINOPHEN 5-325 MG PO TABS
1.0000 | ORAL_TABLET | Freq: Once | ORAL | Status: AC
  Administered 2017-07-02: 1 via ORAL
  Filled 2017-07-02: qty 1

## 2017-07-02 MED ORDER — LIDOCAINE 5 % EX PTCH: 1.0000 | MEDICATED_PATCH | CUTANEOUS | 0 refills | Status: DC

## 2017-07-02 MED ORDER — TRAMADOL HCL 50 MG PO TABS: 50.0000 mg | ORAL_TABLET | Freq: Four times a day (QID) | ORAL | 0 refills | Status: DC | PRN

## 2017-07-02 NOTE — ED Provider Notes (Signed)
[facility] EMERGENCY DEPARTMENT Provider Note   CSN: [id number] Arrival date & time: 07/02/17  1749     History   Chief Complaint Chief Complaint  Patient presents with  . Fall    HPI [name] is a 77 y.o. female.  HPI   77 year old female with history of prior stroke, prior DVT and  PE not on any blood thinner medication, restless leg syndrome, and deficiency anemia presenting for evaluation of a fall.  Patient states that she has trouble with walking due to her left foot drops from a prior surgical procedure in her back.  She uses a walker to walk.  Last night after changing into her pajamas, her legs slipped and she fell down the ground.  She denies hitting her head or loss of consciousness.  No precipitating symptoms prior to the fall.  She report having pain to her right shoulder, and her left knee.  Pain is sharp, worse with movement, pains are nonradiating.  She has been taking her pain medication throughout the day with some improvement but would like further evaluation.  She uses a walker.  She is not any blood thinner medication.    Past Medical History:  Diagnosis Date  . Anemia   . Arthritis   . Bell's palsy   . GERD (gastroesophageal reflux disease)   . Hemochromatosis   . History of hiatal hernia   . Hypertension   . Iron deficiency anemia due to chronic blood loss 03/10/2017  . PE (pulmonary embolism)   . Pneumonia    "walking" pneumonia  . Prothrombin gene mutation ([place]) 05/30/2013  . Restless legs   . Right leg DVT ([place]) 05/30/2013  . Stroke [facility])    found on a MRI, she's not aware otherwise    Patient Active Problem List   Diagnosis Date Noted  . Iron deficiency anemia due to chronic blood loss 03/10/2017  . Postoperative wound dehiscence 12/02/2016  . E. coli UTI   . Abnormal urinalysis   . Medication side effect   . Neuropathic pain   . History of DVT (deep vein thrombosis)   . Benign essential HTN   . RLS (restless legs syndrome)    . Urinary retention   . Hypokalemia   . Hypoalbuminemia due to protein-calorie malnutrition ([place])   . Acute blood loss anemia   . Lumbar stenosis with neurogenic claudication 11/18/2016  . Spondylolisthesis of lumbar region 11/12/2016  . Hereditary hemochromatosis ([place]) 07/28/2016  . Prothrombin gene mutation ([place]) 05/30/2013  . Right leg DVT ([place]) 05/30/2013  . Hemochromatosis 07/29/2012    Past Surgical History:  Procedure Laterality Date  . BACK SURGERY    . COLONOSCOPY    . SHOULDER SURGERY Bilateral     OB History    No data available       Home Medications    Prior to Admission medications   Medication Sig Start Date End Date Taking? Authorizing Provider  amLODipine (NORVASC) 10 MG tablet  04/09/17   [provider]  amLODipine (NORVASC) 5 MG tablet Take 5 mg by mouth daily.  07/23/11   [provider]  Biotin 5000 MCG TABS Take 5,000 mcg by mouth daily.     [provider]  Calcium Carbonate-Vitamin D (CALCIUM 600+D PO) Take 1 tablet by mouth 2 (two) times daily.    [provider]  Cholecalciferol (VITAMIN D) 2000 UNITS tablet Take 2,000 Units by mouth 2 (two) times daily.    [provider]  citalopram (CELEXA) 20 MG tablet Take 20 mg by mouth at bedtime.  08/05/11   [provider]  colchicine 0.6 MG tablet Take 1 tablet (0.6 mg total) by mouth daily. Gout flare up. 12/02/16   Love, Ivan Anchors, PA-C  cyclobenzaprine (FLEXERIL) 10 MG tablet Take 0.5 tablets (5 mg total) by mouth 2 (two) times daily as needed for muscle spasms. 12/02/16   Love, Ivan Anchors, PA-C  doxycycline (VIBRA-TABS) 100 MG tablet Take 1 tablet (100 mg total) by mouth every 12 (twelve) hours. Patient not taking: Reported on 03/11/2017 12/02/16   Love, Ivan Anchors, PA-C  ferrous sulfate 325 (65 FE) MG tablet Take 325 mg 2 (two) times daily with a meal by mouth.    [provider]  fish oil-omega-3 fatty acids 1000 MG capsule Take 1 g by mouth daily.      [provider]  furosemide (LASIX) 20 MG tablet  02/26/17   [provider]  hydrochlorothiazide (HYDRODIURIL) 25 MG tablet Take 25 mg by mouth daily.    [provider]  Hypromellose (ARTIFICIAL TEARS OP) Place 1 drop into both eyes 3 (three) times daily as needed (dry eyes).    [provider]  lidocaine (LIDODERM) 5 % Place 1 patch onto the skin daily. Apply to back at 8 am and remove at 8 pm daily (has to be off for 12 hours daily) Can be purchased over the counter. Patient not taking: Reported on 03/11/2017 12/03/16   Love, Ivan Anchors, PA-C  Multiple Vitamin (MULTIVITAMIN WITH MINERALS) TABS tablet Take 1 tablet by mouth daily.    [provider]  oxyCODONE-acetaminophen (PERCOCET/ROXICET) 5-325 MG tablet  03/10/17   [provider]  pantoprazole (PROTONIX) 40 MG tablet Take 40 mg by mouth daily before supper.    [provider]  potassium chloride SA (K-DUR,KLOR-CON) 20 MEQ tablet Take 1 tablet (20 mEq total) by mouth 2 (two) times daily. 12/02/16   Love, Ivan Anchors, PA-C  pramipexole (MIRAPEX) 0.5 MG tablet Take 0.5 mg by mouth every evening.    [provider]  propranolol (INDERAL) 10 MG tablet  02/22/17   [provider]  RESTASIS 0.05 % ophthalmic emulsion Place 1 drop into both eyes 2 (two) times daily. 09/16/16   [provider]  temazepam (RESTORIL) 15 MG capsule Take 1 capsule (15 mg total) by mouth at bedtime. 12/02/16   Love, Ivan Anchors, PA-C  Turmeric Curcumin 500 MG CAPS Take 500 mg by mouth at bedtime.     [provider]    Family History Family History  Problem Relation Age of Onset  . Congestive Heart Failure Mother   . Pancreatic cancer Father     Social History Social History   Tobacco Use  . Smoking status: Never Smoker  . Smokeless tobacco: Never Used  . Tobacco comment: never used tobacco  Substance Use Topics  . Alcohol use: No    Alcohol/week: 0.0 oz  . Drug use: No       Allergies   Penicillins; Sulfa antibiotics; and Morphine and related   Review of Systems Review of Systems  All other systems reviewed and are negative.    Physical Exam Updated Vital Signs BP (!) 130/55   Pulse 67   Temp 98.3 F (36.8 C) (Oral)   Resp 18   Ht 5' 2.5" (1.588 m)   Wt 83.9 kg (185 lb)   SpO2 98%   BMI 33.30 kg/m   Physical Exam  Constitutional: She appears well-developed and well-nourished. No distress.  Patient sitting in the wheelchair appears to be in no acute discomfort.  HENT:  Head: Atraumatic.  Eyes: Conjunctivae are normal.  Neck: Neck supple.  No cervical midline spine tenderness  Musculoskeletal: She exhibits tenderness (Right shoulder: Tenderness to lateral deltoid on palpation with decreased shoulder extension and flexion with active range of motion but normal passive range of motion.  No deformity noted.).  Left knee: Point tenderness to medial aspects of knee on palpation.  Patella is located, normal knee flexion and extension and no deformity noted.  No bruising or swelling noted.  Right elbow and wrist are nontender, radial pulse palpable, normal grip strength to the right hand Left hip and left ankle nontender  Neurological: She is alert.  Skin: No rash noted.  Psychiatric: She has a normal mood and affect.  Nursing note and vitals reviewed.    ED Treatments / Results  Labs (all labs ordered are listed, but only abnormal results are displayed) Labs Reviewed - No data to display  EKG  EKG Interpretation None       Radiology Dg Shoulder Right  Result Date: 07/02/2017 CLINICAL DATA:  Fall with right shoulder pain EXAM: RIGHT SHOULDER - 2+ VIEW COMPARISON:  11/17/2013 FINDINGS: No acute displaced fracture or malalignment is seen. Moderate AC joint degenerative changes. Moderate glenohumeral degenerative change. Narrowing of the subacromial space consistent with rotator cuff disease. Mild sclerosis of the humeral head.  IMPRESSION: 1. No acute osseous abnormality 2. High-riding humeral head consistent with rotator cuff disease 3. Moderate-to-marked arthritis at the [facility] joint and glenohumeral joint Electronically Signed   By: Donavan Foil M.D.   On: 07/02/2017 19:02   Dg Knee Complete 4 Views Left  Result Date: 07/02/2017 CLINICAL DATA:  Fall with knee pain EXAM: LEFT KNEE - COMPLETE 4+ VIEW COMPARISON:  None. FINDINGS: No acute displaced fracture or malalignment. Small joint effusion. Mild patellofemoral and medial joint space degenerative changes. IMPRESSION: 1. No acute osseous abnormality 2. Mild arthritis of the knee with small joint effusion Electronically Signed   By: Donavan Foil M.D.   On: 07/02/2017 19:03    Procedures Procedures (including critical care time)  Medications Ordered in ED Medications  oxyCODONE-acetaminophen (PERCOCET/ROXICET) 5-325 MG per tablet 1 tablet (1 tablet Oral Given 07/02/17 1842)     Initial Impression / Assessment and Plan / ED Course  I have reviewed the triage vital signs and the nursing notes.  Pertinent labs & imaging results that were available during my care of the patient were reviewed by me and considered in my medical decision making (see chart for details).     BP (!) 131/56   Pulse 73   Temp 98.3 F (36.8 C) (Oral)   Resp 18   Ht 5' 2.5" (1.588 m)   Wt 83.9 kg (185 lb)   SpO2 98%   BMI 33.30 kg/m    Final Clinical Impressions(s) / ED Diagnoses   Final diagnoses:  Right shoulder strain, initial encounter  Sprain of medial collateral ligament of left knee, initial encounter    ED Discharge Orders        Ordered    lidocaine (LIDODERM) 5 %  Every 24 hours     07/02/17 2032    traMADol (ULTRAM) 50 MG tablet  Every 6 hours PRN     07/02/17 2036     6:19 PM Patient had a mechanical fall yesterday.  She is here complaining of pain to  her left knee and right shoulder.  Pain is reproducible on exam.  Will obtain x-rays for further evaluation.  A  sprain or strain is likely.    8:27 PM X-ray of right shoulder show no acute abnormality.  High riding humeral head consistent with rotator cuff tissue disease.  Arthritis noted.  X-ray of left knee without any acute pathology.  Therefore, patient will be treated for sprain and strain with rice therapy.  Knee sleeve provided for support.  Orthopedic referral given as needed.  Return precautions discussed.  Care discussed with Dr. Sherry Ruffing who has also evaluate the patient.   Domenic Moras, PA-C 07/02/17 2038    Tegeler, Gwenyth Allegra, MD 07/03/17 [phone]

## 2017-07-02 NOTE — ED Triage Notes (Signed)
She slipped and fell last night. Injury to her right shoulder and left knee.

## 2017-07-02 NOTE — Discharge Instructions (Signed)
You have been evaluated for your recent injury.  Fortunately xrays of your right shoulder and left knees are without any evidence of broken bones.  You are likely having strain/sprain causing your pain.  Follow instruction below for care.  Take tramadol as needed for pain.  Follow up with your doctor for further care.

## 2017-07-12 ENCOUNTER — Encounter (HOSPITAL_BASED_OUTPATIENT_CLINIC_OR_DEPARTMENT_OTHER): Payer: Self-pay | Admitting: *Deleted

## 2017-07-12 ENCOUNTER — Emergency Department (HOSPITAL_BASED_OUTPATIENT_CLINIC_OR_DEPARTMENT_OTHER): Payer: PPO

## 2017-07-12 ENCOUNTER — Other Ambulatory Visit: Payer: Self-pay

## 2017-07-12 ENCOUNTER — Emergency Department (HOSPITAL_BASED_OUTPATIENT_CLINIC_OR_DEPARTMENT_OTHER)
Admission: EM | Admit: 2017-07-12 | Discharge: 2017-07-12 | Disposition: A | Discharge status: Home / Self Care | Payer: PPO | Attending: Emergency Medicine | Admitting: Emergency Medicine

## 2017-07-12 DIAGNOSIS — Y999 Unspecified external cause status: Secondary | ICD-10-CM | POA: Insufficient documentation

## 2017-07-12 DIAGNOSIS — W010XXA Fall on same level from slipping, tripping and stumbling without subsequent striking against object, initial encounter: Secondary | ICD-10-CM | POA: Diagnosis not present

## 2017-07-12 DIAGNOSIS — Y939 Activity, unspecified: Secondary | ICD-10-CM | POA: Insufficient documentation

## 2017-07-12 DIAGNOSIS — S99912A Unspecified injury of left ankle, initial encounter: Secondary | ICD-10-CM | POA: Diagnosis present

## 2017-07-12 DIAGNOSIS — G510 Bell's palsy: Secondary | ICD-10-CM | POA: Insufficient documentation

## 2017-07-12 DIAGNOSIS — Z79899 Other long term (current) drug therapy: Secondary | ICD-10-CM | POA: Insufficient documentation

## 2017-07-12 DIAGNOSIS — I10 Essential (primary) hypertension: Secondary | ICD-10-CM | POA: Insufficient documentation

## 2017-07-12 DIAGNOSIS — Y929 Unspecified place or not applicable: Secondary | ICD-10-CM | POA: Diagnosis not present

## 2017-07-12 DIAGNOSIS — S99922A Unspecified injury of left foot, initial encounter: Secondary | ICD-10-CM | POA: Diagnosis not present

## 2017-07-12 DIAGNOSIS — S93402A Sprain of unspecified ligament of left ankle, initial encounter: Secondary | ICD-10-CM | POA: Diagnosis not present

## 2017-07-12 DIAGNOSIS — M79672 Pain in left foot: Secondary | ICD-10-CM | POA: Diagnosis not present

## 2017-07-12 MED ORDER — NAPROXEN 500 MG PO TABS: 500.0000 mg | ORAL_TABLET | Freq: Two times a day (BID) | ORAL | 0 refills | Status: DC

## 2017-07-12 NOTE — Discharge Instructions (Signed)
Return to the ED with any concerns including increased pain, swelling/numbness/discoloration of foot or toes, or any other alarming symptoms  Xrays should be repeated in 1-2 weeks if symptoms persist or do not improve.  Some fracture can be not seen on initial xrays and show up on repeat xrays after time.

## 2017-07-12 NOTE — ED Notes (Signed)
Patient transported to X-ray 

## 2017-07-12 NOTE — ED Provider Notes (Signed)
[facility] EMERGENCY DEPARTMENT Provider Note   CSN: [id number] Arrival date & time: 07/12/17  1104     History   Chief Complaint Chief Complaint  Patient presents with  . Fall    HPI [name] is a 77 y.o. female.  HPI  Patient presents with left foot pain after fall 2 days ago.  She states she tripped and fell in her home and her left foot was bent up underneath her body.  She has been able to bear weight on the foot but pain has been worsening over the past 2 days.  She has not had any treatment prior to arrival although she did take a Percocet this morning which helped somewhat with her pain.  No hip or knee pain.  She did not strike her head.  Pain is worse with movement, palpation, and bearing weight.  There are no other associated systemic symptoms, there are no other alleviating or modifying factors.   Past Medical History:  Diagnosis Date  . Anemia   . Arthritis   . Bell's palsy   . GERD (gastroesophageal reflux disease)   . Hemochromatosis   . History of hiatal hernia   . Hypertension   . Iron deficiency anemia due to chronic blood loss 03/10/2017  . PE (pulmonary embolism)   . Pneumonia    "walking" pneumonia  . Prothrombin gene mutation ([place]) 05/30/2013  . Restless legs   . Right leg DVT ([place]) 05/30/2013  . Stroke [facility])    found on a MRI, she's not aware otherwise    Patient Active Problem List   Diagnosis Date Noted  . Iron deficiency anemia due to chronic blood loss 03/10/2017  . Postoperative wound dehiscence 12/02/2016  . E. coli UTI   . Abnormal urinalysis   . Medication side effect   . Neuropathic pain   . History of DVT (deep vein thrombosis)   . Benign essential HTN   . RLS (restless legs syndrome)   . Urinary retention   . Hypokalemia   . Hypoalbuminemia due to protein-calorie malnutrition ([place])   . Acute blood loss anemia   . Lumbar stenosis with neurogenic claudication 11/18/2016  . Spondylolisthesis of lumbar region  11/12/2016  . Hereditary hemochromatosis ([place]) 07/28/2016  . Prothrombin gene mutation ([place]) 05/30/2013  . Right leg DVT ([place]) 05/30/2013  . Hemochromatosis 07/29/2012    Past Surgical History:  Procedure Laterality Date  . BACK SURGERY    . COLONOSCOPY    . SHOULDER SURGERY Bilateral     OB History    No data available       Home Medications    Prior to Admission medications   Medication Sig Start Date End Date Taking? Authorizing Provider  amLODipine (NORVASC) 10 MG tablet  04/09/17   [provider]  amLODipine (NORVASC) 5 MG tablet Take 5 mg by mouth daily.  07/23/11   [provider]  Biotin 5000 MCG TABS Take 5,000 mcg by mouth daily.     [provider]  Calcium Carbonate-Vitamin D (CALCIUM 600+D PO) Take 1 tablet by mouth 2 (two) times daily.    [provider]  Cholecalciferol (VITAMIN D) 2000 UNITS tablet Take 2,000 Units by mouth 2 (two) times daily.    [provider]  citalopram (CELEXA) 20 MG tablet Take 20 mg by mouth at bedtime.  08/05/11   [provider]  colchicine 0.6 MG tablet Take 1 tablet (0.6 mg total) by mouth daily. Gout flare  up. 12/02/16   Love, Ivan Anchors, PA-C  cyclobenzaprine (FLEXERIL) 10 MG tablet Take 0.5 tablets (5 mg total) by mouth 2 (two) times daily as needed for muscle spasms. 12/02/16   Love, Ivan Anchors, PA-C  doxycycline (VIBRA-TABS) 100 MG tablet Take 1 tablet (100 mg total) by mouth every 12 (twelve) hours. Patient not taking: Reported on 03/11/2017 12/02/16   Love, Ivan Anchors, PA-C  ferrous sulfate 325 (65 FE) MG tablet Take 325 mg 2 (two) times daily with a meal by mouth.    [provider]  fish oil-omega-3 fatty acids 1000 MG capsule Take 1 g by mouth daily.     [provider]  furosemide (LASIX) 20 MG tablet  02/26/17   [provider]  hydrochlorothiazide (HYDRODIURIL) 25 MG tablet Take 25 mg by mouth daily.    [provider]  Hypromellose (ARTIFICIAL  TEARS OP) Place 1 drop into both eyes 3 (three) times daily as needed (dry eyes).    [provider]  lidocaine (LIDODERM) 5 % Place 1 patch onto the skin daily. Apply to right shoulder and left knee at 8 am and remove at 8 pm daily (has to be off for 12 hours daily) Can be purchased over the counter. 07/02/17   Domenic Moras, PA-C  Multiple Vitamin (MULTIVITAMIN WITH MINERALS) TABS tablet Take 1 tablet by mouth daily.    [provider]  naproxen (NAPROSYN) 500 MG tablet Take 1 tablet (500 mg total) by mouth 2 (two) times daily. 07/12/17   Pixie Casino, MD  oxyCODONE-acetaminophen (PERCOCET/ROXICET) 5-325 MG tablet  03/10/17   [provider]  pantoprazole (PROTONIX) 40 MG tablet Take 40 mg by mouth daily before supper.    [provider]  potassium chloride SA (K-DUR,KLOR-CON) 20 MEQ tablet Take 1 tablet (20 mEq total) by mouth 2 (two) times daily. 12/02/16   Love, Ivan Anchors, PA-C  pramipexole (MIRAPEX) 0.5 MG tablet Take 0.5 mg by mouth every evening.    [provider]  propranolol (INDERAL) 10 MG tablet  02/22/17   [provider]  RESTASIS 0.05 % ophthalmic emulsion Place 1 drop into both eyes 2 (two) times daily. 09/16/16   [provider]  temazepam (RESTORIL) 15 MG capsule Take 1 capsule (15 mg total) by mouth at bedtime. 12/02/16   Love, Ivan Anchors, PA-C  traMADol (ULTRAM) 50 MG tablet Take 1 tablet (50 mg total) by mouth every 6 (six) hours as needed for moderate pain or severe pain. 07/02/17   Domenic Moras, PA-C  Turmeric Curcumin 500 MG CAPS Take 500 mg by mouth at bedtime.     [provider]    Family History Family History  Problem Relation Age of Onset  . Congestive Heart Failure Mother   . Pancreatic cancer Father     Social History Social History   Tobacco Use  . Smoking status: Never Smoker  . Smokeless tobacco: Never Used  . Tobacco comment: never used tobacco  Substance Use Topics  . Alcohol use: No     Alcohol/week: 0.0 oz  . Drug use: No     Allergies   Penicillins; Sulfa antibiotics; and Morphine and related   Review of Systems Review of Systems  ROS reviewed and all otherwise negative except for mentioned in HPI   Physical Exam Updated Vital Signs BP 138/65 (BP Location: Left Arm)   Pulse 60   Temp 98.1 F (36.7 C) (Oral)   Resp 18   Ht 5' 2.5" (  1.588 m)   Wt 83.9 kg (185 lb)   SpO2 100%   BMI 33.30 kg/m  Vitals reviewed Physical Exam  Physical Examination: General appearance - alert, well appearing, and in no distress Mental status - alert, oriented to person, place, and time Eyes - no conjunctival injection, no scleral icterus Chest - clear to auscultation, no wheezes, rales or rhonchi, symmetric air entry Neurological - alert, oriented, sensation intact distally in left foot, strength 5/5 in lower extremities Musculoskeletal - ttp over left lateral foot, otherwise no joint tenderness, deformity or swelling Extremities - peripheral pulses normal, no pedal edema, no clubbing or cyanosis Skin - normal coloration and turgor, no rashes   ED Treatments / Results  Labs (all labs ordered are listed, but only abnormal results are displayed) Labs Reviewed - No data to display  EKG  EKG Interpretation None       Radiology Dg Foot Complete Left  Result Date: 07/12/2017 CLINICAL DATA:  LEFT foot pain after fall EXAM: LEFT FOOT - COMPLETE 3+ VIEW COMPARISON:  None. FINDINGS: No fracture or dislocation of mid foot or forefoot. The phalanges are normal. The calcaneus is normal. No soft tissue abnormality. IMPRESSION: No fracture or dislocation. Electronically Signed   By: Suzy Bouchard M.D.   On: 07/12/2017 12:29    Procedures Procedures (including critical care time)  Medications Ordered in ED Medications - No data to display   Initial Impression / Assessment and Plan / ED Course  I have reviewed the triage vital signs and the nursing notes.  Pertinent  labs & imaging results that were available during my care of the patient were reviewed by me and considered in my medical decision making (see chart for details).     Patient presents with complaint of left foot pain ongoing after fall 2 days ago.  X-ray is negative for fracture.  Suspect sprain.  Patient placed in ASO she is able to bear weight without difficulty.  Advised anti-inflammatories and patient also has some Percocet that she can use for breakthrough pain at home.  Given information for follow-up with orthopedics on an outpatient basis.  Discharged with strict return precautions.  Pt agreeable with plan.  Final Clinical Impressions(s) / ED Diagnoses   Final diagnoses:  Sprain of left ankle, unspecified ligament, initial encounter    ED Discharge Orders        Ordered    naproxen (NAPROSYN) 500 MG tablet  2 times daily     07/12/17 1251       Afua Hoots, Forbes Cellar, MD 07/12/17 1430

## 2017-07-12 NOTE — ED Triage Notes (Signed)
Fall 2 days ago. Left foot swelling and pain since that time. Denies LOC or hitting head during fall.

## 2017-07-13 ENCOUNTER — Other Ambulatory Visit: Payer: Self-pay

## 2017-07-13 ENCOUNTER — Inpatient Hospital Stay: Payer: PPO

## 2017-07-13 ENCOUNTER — Inpatient Hospital Stay: Payer: PPO | Attending: Hematology & Oncology | Admitting: Hematology & Oncology

## 2017-07-13 ENCOUNTER — Encounter: Payer: Self-pay | Admitting: Hematology & Oncology

## 2017-07-13 VITALS — BP 142/57 | HR 70 | Temp 97.4°F | Resp 20 | Wt 185.0 lb

## 2017-07-13 DIAGNOSIS — D500 Iron deficiency anemia secondary to blood loss (chronic): Secondary | ICD-10-CM

## 2017-07-13 DIAGNOSIS — Z7982 Long term (current) use of aspirin: Secondary | ICD-10-CM | POA: Insufficient documentation

## 2017-07-13 DIAGNOSIS — I82501 Chronic embolism and thrombosis of unspecified deep veins of right lower extremity: Secondary | ICD-10-CM | POA: Diagnosis not present

## 2017-07-13 DIAGNOSIS — Z79899 Other long term (current) drug therapy: Secondary | ICD-10-CM | POA: Diagnosis not present

## 2017-07-13 DIAGNOSIS — I82401 Acute embolism and thrombosis of unspecified deep veins of right lower extremity: Secondary | ICD-10-CM

## 2017-07-13 LAB — CBC WITH DIFFERENTIAL (CANCER CENTER ONLY)
BASOS ABS: 0 10*3/uL (ref 0.0–0.1)
Basophils Relative: 1 %
EOS PCT: 4 %
Eosinophils Absolute: 0.3 10*3/uL (ref 0.0–0.5)
HEMATOCRIT: 35.4 % (ref 34.8–46.6)
Hemoglobin: 11.4 g/dL — ABNORMAL LOW (ref 11.6–15.9)
LYMPHS PCT: 24 %
Lymphs Abs: 1.6 10*3/uL (ref 0.9–3.3)
MCH: 27.5 pg (ref 26.0–34.0)
MCHC: 32.2 g/dL (ref 32.0–36.0)
MCV: 85.5 fL (ref 81.0–101.0)
MONO ABS: 0.7 10*3/uL (ref 0.1–0.9)
Monocytes Relative: 10 %
Neutro Abs: 4.3 10*3/uL (ref 1.5–6.5)
Neutrophils Relative %: 61 %
PLATELETS: 257 10*3/uL (ref 145–400)
RBC: 4.14 MIL/uL (ref 3.70–5.32)
RDW: 15.9 % — ABNORMAL HIGH (ref 11.1–15.7)
WBC Count: 6.9 10*3/uL (ref 3.9–10.0)

## 2017-07-13 LAB — CMP (CANCER CENTER ONLY)
ALBUMIN: 3.4 g/dL — AB (ref 3.5–5.0)
ALK PHOS: 103 U/L (ref 40–150)
ALT: 18 U/L (ref 0–55)
AST: 27 U/L (ref 5–34)
Anion gap: 12 — ABNORMAL HIGH (ref 3–11)
BILIRUBIN TOTAL: 0.7 mg/dL (ref 0.2–1.2)
BUN: 18 mg/dL (ref 7–26)
CO2: 31 mmol/L — AB (ref 22–29)
Calcium: 10 mg/dL (ref 8.4–10.4)
Chloride: 97 mmol/L — ABNORMAL LOW (ref 98–109)
Creatinine: 1.26 mg/dL — ABNORMAL HIGH (ref 0.60–1.10)
GFR, Est AFR Am: 46 mL/min — ABNORMAL LOW (ref >60)
GFR, Estimated: 40 mL/min — ABNORMAL LOW (ref >60)
GLUCOSE: 113 mg/dL (ref 70–140)
POTASSIUM: 3.5 mmol/L (ref 3.5–5.1)
SODIUM: 140 mmol/L (ref 136–145)
TOTAL PROTEIN: 7.8 g/dL (ref 6.4–8.3)

## 2017-07-13 NOTE — Progress Notes (Signed)
Hematology and Oncology Follow Up Visit  [name] [id number] 1941-04-10 77 y.o. 07/13/2017   Principle Diagnosis:  DVT of the right leg Prothrombin II gene mutation Hemachromatosis (homozygous for C282Y Mutation) Past history of right lower extremity DVT/PE Iron deficiency anemia secondary to blood loss.  Current Therapy:   Aspirin 162 mg PO daily IV iron-Feraheme dose given in 02/2017    Interim History:  [name] is here today for a follow-up.  She is having quite a lot of problems.  She comes in with a walker.  She is having issues with her right knee.  It is swollen.  It is slightly warm.  She sees the orthopedist next week.  She is not able to exercise because of her arthritic issues.  When we last saw her in December, her ferritin was 53 with an iron saturation of 14%.  As such, she did not require any phlebotomies.  We have given her iron in the past because she is felt so poorly.  We had to give her iron back in November which really helped her hemoglobin.  Her appetite is okay.  She was told that she cannot take Aleve or Advil.  I told her that she can take these as long as she takes them with food.  She already is on a PPI  Overall, I said her performance status is ECOG 2. .  Medications:  Allergies as of 07/13/2017      Reactions   Penicillins Anaphylaxis   Pt was 63 or 77 years old  Has patient had a PCN reaction causing immediate rash, facial/tongue/throat swelling, SOB or lightheadedness with hypotension: Yes Has patient had a PCN reaction causing severe rash involving mucus membranes or skin necrosis: Unknown Has patient had a PCN reaction that required hospitalization: No Has patient had a PCN reaction occurring within the last 10 years: No If all of the above answers are "NO", then may proceed with Cephalosporin use.   Sulfa Antibiotics Other (See Comments)   unknown   Morphine And Related Other (See Comments)   Twitching in large doses         Medication List        Accurate as of 07/13/17  1:30 PM. Always use your most recent med list.          amLODipine 5 MG tablet Commonly known as:  NORVASC Take 5 mg by mouth daily.   amLODipine 10 MG tablet Commonly known as:  NORVASC   ARTIFICIAL TEARS OP Place 1 drop into both eyes 3 (three) times daily as needed (dry eyes).   Biotin 5000 MCG Tabs Take 5,000 mcg by mouth daily.   CALCIUM 600+D PO Take 1 tablet by mouth 2 (two) times daily.   citalopram 20 MG tablet Commonly known as:  CELEXA Take 20 mg by mouth at bedtime.   colchicine 0.6 MG tablet Take 1 tablet (0.6 mg total) by mouth daily. Gout flare up.   cyclobenzaprine 10 MG tablet Commonly known as:  FLEXERIL Take 0.5 tablets (5 mg total) by mouth 2 (two) times daily as needed for muscle spasms.   doxycycline 100 MG tablet Commonly known as:  VIBRA-TABS Take 1 tablet (100 mg total) by mouth every 12 (twelve) hours.   ferrous sulfate 325 (65 FE) MG tablet Take 325 mg 2 (two) times daily with a meal by mouth.   fish oil-omega-3 fatty acids 1000 MG capsule Take 1 g by mouth daily.   furosemide 20 MG tablet  Commonly known as:  LASIX   hydrochlorothiazide 25 MG tablet Commonly known as:  HYDRODIURIL Take 25 mg by mouth daily.   lidocaine 5 % Commonly known as:  LIDODERM Place 1 patch onto the skin daily. Apply to right shoulder and left knee at 8 am and remove at 8 pm daily (has to be off for 12 hours daily) Can be purchased over the counter.   multivitamin with minerals Tabs tablet Take 1 tablet by mouth daily.   naproxen 500 MG tablet Commonly known as:  NAPROSYN Take 1 tablet (500 mg total) by mouth 2 (two) times daily.   oxyCODONE-acetaminophen 5-325 MG tablet Commonly known as:  PERCOCET/ROXICET   pantoprazole 40 MG tablet Commonly known as:  PROTONIX Take 40 mg by mouth daily before supper.   potassium chloride SA 20 MEQ tablet Commonly known as:  K-DUR,KLOR-CON Take 1 tablet (20  mEq total) by mouth 2 (two) times daily.   pramipexole 0.5 MG tablet Commonly known as:  MIRAPEX Take 0.5 mg by mouth every evening.   propranolol 10 MG tablet Commonly known as:  INDERAL   RESTASIS 0.05 % ophthalmic emulsion Generic drug:  cycloSPORINE Place 1 drop into both eyes 2 (two) times daily.   temazepam 15 MG capsule Commonly known as:  RESTORIL Take 1 capsule (15 mg total) by mouth at bedtime.   traMADol 50 MG tablet Commonly known as:  ULTRAM Take 1 tablet (50 mg total) by mouth every 6 (six) hours as needed for moderate pain or severe pain.   Turmeric Curcumin 500 MG Caps Take 500 mg by mouth at bedtime.   Vitamin D 2000 units tablet Take 2,000 Units by mouth 2 (two) times daily.       Allergies:  Allergies  Allergen Reactions  . Penicillins Anaphylaxis    Pt was 88 or 77 years old  Has patient had a PCN reaction causing immediate rash, facial/tongue/throat swelling, SOB or lightheadedness with hypotension: Yes Has patient had a PCN reaction causing severe rash involving mucus membranes or skin necrosis: Unknown Has patient had a PCN reaction that required hospitalization: No Has patient had a PCN reaction occurring within the last 10 years: No If all of the above answers are "NO", then may proceed with Cephalosporin use.   . Sulfa Antibiotics Other (See Comments)    unknown  . Morphine And Related Other (See Comments)    Twitching in large doses     Past Medical History, Surgical history, Social history, and Family History were reviewed and updated.  Review of Systems: Review of Systems  Constitutional: Positive for malaise/fatigue.  HENT: Negative.   Eyes: Positive for blurred vision.  Respiratory: Negative.   Cardiovascular: Negative.   Gastrointestinal: Negative.   Genitourinary: Positive for dysuria.  Musculoskeletal: Positive for joint pain and myalgias.  Skin: Negative.   Neurological: Negative.   Endo/Heme/Allergies: Negative.    Psychiatric/Behavioral: Negative.      Physical Exam:  weight is 185 lb (83.9 kg). Her oral temperature is 97.4 F (36.3 C) (abnormal). Her blood pressure is 142/57 (abnormal) and her pulse is 70. Her respiration is 20 and oxygen saturation is 97%.   Wt Readings from Last 3 Encounters:  07/13/17 185 lb (83.9 kg)  07/12/17 185 lb (83.9 kg)  07/02/17 185 lb (83.9 kg)    I examined [name].  The findings on my examination are noted below:   Well-developed and well-nourished white female in no obvious distress.  Head and neck exam shows  no ocular or oral lesions.  She has no palpable cervical or supraclavicular lymph nodes.  Lungs are clear bilaterally.  Cardiac exam regular rate and rhythm with no murmurs, rubs or bruits.  Abdomen is soft.  She has good bowel sounds.  There is no fluid wave.  There is no palpable liver or spleen tip.  Back exam shows the laminectomy scar in the lumbar spine that is healed.  Extremities shows the foot drop with the left leg.  She has a brace on the left leg to prevent foot drop.  Right leg is without obvious venous thrombus.  There is no venous cord that is palpable in the right leg.  She has a negative Homans sign bilaterally.  She does have a compression stocking on the left leg.  Neurological exam shows the foot drop with the left foot.  Skin exam shows no rashes, ecchymoses or petechia.    Lab Results  Component Value Date   WBC 6.9 07/13/2017   HGB 11.7 04/13/2017   HCT 35.4 07/13/2017   MCV 85.5 07/13/2017   PLT 257 07/13/2017   Lab Results  Component Value Date   FERRITIN 53 04/13/2017   IRON 36 (L) 04/13/2017   TIBC 250 04/13/2017   UIBC 215 04/13/2017   IRONPCTSAT 14 (L) 04/13/2017   Lab Results  Component Value Date   RETICCTPCT 0.9 05/11/2014   RBC 4.14 07/13/2017   RETICCTABS 39.7 05/11/2014   No results found for: KPAFRELGTCHN, LAMBDASER, KAPLAMBRATIO No results found for: IGGSERUM, IGA, IGMSERUM No results found for:  Odetta Pink, SPEI   Chemistry      Component Value Date/Time   NA 144 04/13/2017 1134   NA 142 07/28/2016 1144   K 3.3 04/13/2017 1134   K 3.6 07/28/2016 1144   CL 97 (L) 04/13/2017 1134   CO2 32 04/13/2017 1134   CO2 33 (H) 07/28/2016 1144   BUN 18 04/13/2017 1134   BUN 22.6 07/28/2016 1144   CREATININE 1.0 04/13/2017 1134   CREATININE 1.0 07/28/2016 1144      Component Value Date/Time   CALCIUM 10.1 04/13/2017 1134   CALCIUM 9.5 07/28/2016 1144   ALKPHOS 103 (H) 04/13/2017 1134   ALKPHOS 84 07/28/2016 1144   AST 27 04/13/2017 1134   AST 24 07/28/2016 1144   ALT 27 04/13/2017 1134   ALT 17 07/28/2016 1144   BILITOT 0.50 04/13/2017 1134   BILITOT 0.41 07/28/2016 1144     Impression and Plan: [name] is 77 yo white female with a chronic DVT of the right leg. She is on aspirin.  I really feel bad for her.  She has a whole lot going on.  Thankfully, I do not see anything that is hematologic related.  I do not see anything that is related to her having hemochromatosis.  She sees the orthopedist next week.  Her weight really needs to come down.  Unfortunately, it is hard for her to exercise given her arthritic issues.  I think we got to see her back in another 4-6 weeks.  A lot seems to be going on right now and I want to make sure that we follow-up with her.  Volanda Napoleon, MD 3/18/20191:30 PM

## 2017-07-14 LAB — IRON AND TIBC
IRON: 37 ug/dL — AB (ref 41–142)
Saturation Ratios: 15 % — ABNORMAL LOW (ref 21–57)
TIBC: 250 ug/dL (ref 236–444)
UIBC: 213 ug/dL

## 2017-07-14 LAB — FERRITIN: FERRITIN: 50 ng/mL (ref 9–269)

## 2017-07-21 DIAGNOSIS — M179 Osteoarthritis of knee, unspecified: Secondary | ICD-10-CM | POA: Diagnosis not present

## 2017-07-21 DIAGNOSIS — M25562 Pain in left knee: Secondary | ICD-10-CM | POA: Diagnosis not present

## 2017-07-21 DIAGNOSIS — M25569 Pain in unspecified knee: Secondary | ICD-10-CM | POA: Diagnosis not present

## 2017-07-21 DIAGNOSIS — M1712 Unilateral primary osteoarthritis, left knee: Secondary | ICD-10-CM | POA: Diagnosis not present

## 2017-07-23 DIAGNOSIS — L570 Actinic keratosis: Secondary | ICD-10-CM | POA: Diagnosis not present

## 2017-07-23 DIAGNOSIS — D1801 Hemangioma of skin and subcutaneous tissue: Secondary | ICD-10-CM | POA: Diagnosis not present

## 2017-07-23 DIAGNOSIS — L821 Other seborrheic keratosis: Secondary | ICD-10-CM | POA: Diagnosis not present

## 2017-07-23 DIAGNOSIS — D225 Melanocytic nevi of trunk: Secondary | ICD-10-CM | POA: Diagnosis not present

## 2017-07-23 DIAGNOSIS — L820 Inflamed seborrheic keratosis: Secondary | ICD-10-CM | POA: Diagnosis not present

## 2017-08-02 DIAGNOSIS — M48062 Spinal stenosis, lumbar region with neurogenic claudication: Secondary | ICD-10-CM | POA: Diagnosis not present

## 2017-08-24 ENCOUNTER — Inpatient Hospital Stay: Payer: PPO | Attending: Hematology & Oncology

## 2017-08-24 ENCOUNTER — Other Ambulatory Visit: Payer: Self-pay

## 2017-08-24 ENCOUNTER — Inpatient Hospital Stay (HOSPITAL_BASED_OUTPATIENT_CLINIC_OR_DEPARTMENT_OTHER): Payer: PPO | Admitting: Hematology & Oncology

## 2017-08-24 ENCOUNTER — Other Ambulatory Visit: Payer: PPO

## 2017-08-24 ENCOUNTER — Ambulatory Visit: Payer: PPO | Admitting: Hematology & Oncology

## 2017-08-24 DIAGNOSIS — I82401 Acute embolism and thrombosis of unspecified deep veins of right lower extremity: Secondary | ICD-10-CM

## 2017-08-24 DIAGNOSIS — I82501 Chronic embolism and thrombosis of unspecified deep veins of right lower extremity: Secondary | ICD-10-CM | POA: Diagnosis not present

## 2017-08-24 DIAGNOSIS — Z79899 Other long term (current) drug therapy: Secondary | ICD-10-CM | POA: Diagnosis not present

## 2017-08-24 DIAGNOSIS — Z7982 Long term (current) use of aspirin: Secondary | ICD-10-CM | POA: Diagnosis not present

## 2017-08-24 LAB — CMP (CANCER CENTER ONLY)
ALK PHOS: 96 U/L (ref 40–150)
ALT: 21 U/L (ref 0–55)
AST: 27 U/L (ref 5–34)
Albumin: 3.5 g/dL (ref 3.5–5.0)
Anion gap: 10 (ref 3–11)
BUN: 28 mg/dL — AB (ref 7–26)
CALCIUM: 9.6 mg/dL (ref 8.4–10.4)
CHLORIDE: 102 mmol/L (ref 98–109)
CO2: 30 mmol/L — AB (ref 22–29)
CREATININE: 1.31 mg/dL — AB (ref 0.60–1.10)
GFR, EST AFRICAN AMERICAN: 44 mL/min — AB (ref >60)
GFR, EST NON AFRICAN AMERICAN: 38 mL/min — AB (ref >60)
Glucose, Bld: 84 mg/dL (ref 70–140)
Potassium: 3.3 mmol/L — ABNORMAL LOW (ref 3.5–5.1)
Sodium: 142 mmol/L (ref 136–145)
Total Bilirubin: 0.3 mg/dL (ref 0.2–1.2)
Total Protein: 7.6 g/dL (ref 6.4–8.3)

## 2017-08-24 LAB — CBC WITH DIFFERENTIAL (CANCER CENTER ONLY)
BASOS ABS: 0 10*3/uL (ref 0.0–0.1)
Basophils Relative: 1 %
EOS PCT: 4 %
Eosinophils Absolute: 0.2 10*3/uL (ref 0.0–0.5)
HCT: 36.7 % (ref 34.8–46.6)
HEMOGLOBIN: 11.7 g/dL (ref 11.6–15.9)
LYMPHS ABS: 1.4 10*3/uL (ref 0.9–3.3)
LYMPHS PCT: 26 %
MCH: 27.5 pg (ref 26.0–34.0)
MCHC: 31.9 g/dL — ABNORMAL LOW (ref 32.0–36.0)
MCV: 86.2 fL (ref 81.0–101.0)
Monocytes Absolute: 0.5 10*3/uL (ref 0.1–0.9)
Monocytes Relative: 10 %
Neutro Abs: 3.2 10*3/uL (ref 1.5–6.5)
Neutrophils Relative %: 59 %
PLATELETS: 223 10*3/uL (ref 145–400)
RBC: 4.26 MIL/uL (ref 3.70–5.32)
RDW: 15.3 % (ref 11.1–15.7)
WBC: 5.4 10*3/uL (ref 3.9–10.0)

## 2017-08-24 LAB — IRON AND TIBC
Iron: 45 ug/dL (ref 41–142)
Saturation Ratios: 16 % — ABNORMAL LOW (ref 21–57)
TIBC: 281 ug/dL (ref 236–444)
UIBC: 236 ug/dL

## 2017-08-24 LAB — FERRITIN: FERRITIN: 18 ng/mL (ref 9–269)

## 2017-08-24 NOTE — Progress Notes (Signed)
Hematology and Oncology Follow Up Visit  [name] [id number] [birth year] 77 y.o. 08/24/2017   Principle Diagnosis:  DVT of the right leg Prothrombin II gene mutation Hemachromatosis (homozygous for C282Y Mutation) Past history of right lower extremity DVT/PE Iron deficiency anemia secondary to blood loss.  Current Therapy:   Aspirin 162 mg PO daily IV iron-Feraheme dose given in 02/2017    Interim History:  [name] is here today for a follow-up.  Her right knee is still bothering her.  She had an x-ray done.  I think she saw the orthopedist.  She said that there was "bone-on-bone".  I am not sure when she goes back to see the orthopedist.  Otherwise, she seems to be doing a little bit better.  Is not as much stress at home right now.  She can is still cannot exercise all that much because of her knee.  As far as her hemochromatosis is concerned, this really is not a problem.  We saw her in March, her ferritin was 50 with an iron saturation of 15%.  I told her that if she is going to need surgery for her right knee, she must let us know so we can work with the orthopedist and developing a plan for anticoagulation so she does not develop a blood clot.  Overall, her performance status is ECOG 1. .  Medications:  Allergies as of 08/24/2017      Reactions   Penicillins Anaphylaxis   Pt was 62 or 78 years old  Has patient had a PCN reaction causing immediate rash, facial/tongue/throat swelling, SOB or lightheadedness with hypotension: Yes Has patient had a PCN reaction causing severe rash involving mucus membranes or skin necrosis: Unknown Has patient had a PCN reaction that required hospitalization: No Has patient had a PCN reaction occurring within the last 10 years: No If all of the above answers are "NO", then may proceed with Cephalosporin use.   Sulfa Antibiotics Other (See Comments)   unknown   Morphine And Related Other (See Comments)   Twitching in large doses       Medication List        Accurate as of 08/24/17 10:10 AM. Always use your most recent med list.          amLODipine 10 MG tablet Commonly known as:  NORVASC   ARTIFICIAL TEARS OP Place 1 drop into both eyes 3 (three) times daily as needed (dry eyes).   Biotin 5000 MCG Tabs Take 5,000 mcg by mouth daily.   CALCIUM 600+D PO Take 1 tablet by mouth 2 (two) times daily.   citalopram 20 MG tablet Commonly known as:  CELEXA Take 20 mg by mouth at bedtime.   colchicine 0.6 MG tablet Take 1 tablet (0.6 mg total) by mouth daily. Gout flare up.   cyclobenzaprine 10 MG tablet Commonly known as:  FLEXERIL Take 0.5 tablets (5 mg total) by mouth 2 (two) times daily as needed for muscle spasms.   doxycycline 100 MG tablet Commonly known as:  VIBRA-TABS Take 1 tablet (100 mg total) by mouth every 12 (twelve) hours.   ferrous sulfate 325 (65 FE) MG tablet Take 325 mg 2 (two) times daily with a meal by mouth.   fish oil-omega-3 fatty acids 1000 MG capsule Take 1 g by mouth daily.   furosemide 20 MG tablet Commonly known as:  LASIX   hydrochlorothiazide 25 MG tablet Commonly known as:  HYDRODIURIL Take 25 mg by mouth daily.  lidocaine 5 % Commonly known as:  LIDODERM Place 1 patch onto the skin daily. Apply to right shoulder and left knee at 8 am and remove at 8 pm daily (has to be off for 12 hours daily) Can be purchased over the counter.   multivitamin with minerals Tabs tablet Take 1 tablet by mouth daily.   naproxen 500 MG tablet Commonly known as:  NAPROSYN Take 1 tablet (500 mg total) by mouth 2 (two) times daily.   oxyCODONE-acetaminophen 5-325 MG tablet Commonly known as:  PERCOCET/ROXICET   pantoprazole 40 MG tablet Commonly known as:  PROTONIX Take 40 mg by mouth daily before supper.   potassium chloride SA 20 MEQ tablet Commonly known as:  K-DUR,KLOR-CON Take 1 tablet (20 mEq total) by mouth 2 (two) times daily.   pramipexole 0.5 MG tablet Commonly  known as:  MIRAPEX Take 0.5 mg by mouth every evening.   propranolol 10 MG tablet Commonly known as:  INDERAL   RESTASIS 0.05 % ophthalmic emulsion Generic drug:  cycloSPORINE Place 1 drop into both eyes 2 (two) times daily.   temazepam 15 MG capsule Commonly known as:  RESTORIL Take 1 capsule (15 mg total) by mouth at bedtime.   traMADol 50 MG tablet Commonly known as:  ULTRAM Take 1 tablet (50 mg total) by mouth every 6 (six) hours as needed for moderate pain or severe pain.   Turmeric Curcumin 500 MG Caps Take 500 mg by mouth at bedtime.   Vitamin D 2000 units tablet Take 2,000 Units by mouth 2 (two) times daily.       Allergies:  Allergies  Allergen Reactions  . Penicillins Anaphylaxis    Pt was 51 or 77 years old  Has patient had a PCN reaction causing immediate rash, facial/tongue/throat swelling, SOB or lightheadedness with hypotension: Yes Has patient had a PCN reaction causing severe rash involving mucus membranes or skin necrosis: Unknown Has patient had a PCN reaction that required hospitalization: No Has patient had a PCN reaction occurring within the last 10 years: No If all of the above answers are "NO", then may proceed with Cephalosporin use.   . Sulfa Antibiotics Other (See Comments)    unknown  . Morphine And Related Other (See Comments)    Twitching in large doses     Past Medical History, Surgical history, Social history, and Family History were reviewed and updated.  Review of Systems: Review of Systems  Constitutional: Positive for malaise/fatigue.  HENT: Negative.   Eyes: Positive for blurred vision.  Respiratory: Negative.   Cardiovascular: Negative.   Gastrointestinal: Negative.   Genitourinary: Positive for dysuria.  Musculoskeletal: Positive for joint pain and myalgias.  Skin: Negative.   Neurological: Negative.   Endo/Heme/Allergies: Negative.   Psychiatric/Behavioral: Negative.      Physical Exam:  weight is 183 lb (83 kg).  Her oral temperature is 97.9 F (36.6 C). Her blood pressure is 148/72 (abnormal) and her pulse is 60. Her respiration is 17 and oxygen saturation is 98%.   Wt Readings from Last 3 Encounters:  08/24/17 183 lb (83 kg)  07/13/17 185 lb (83.9 kg)  07/12/17 185 lb (83.9 kg)    I examined [name].  The findings on my examination are noted below:   Well-developed and well-nourished white female in no obvious distress.  Head and neck exam shows no ocular or oral lesions.  She has no palpable cervical or supraclavicular lymph nodes.  Lungs are clear bilaterally.  Cardiac exam regular rate  and rhythm with no murmurs, rubs or bruits.  Abdomen is soft.  She has good bowel sounds.  There is no fluid wave.  There is no palpable liver or spleen tip.  Back exam shows the laminectomy scar in the lumbar spine that is healed.  Extremities shows the foot drop with the left leg.  She has a brace on the left leg to prevent foot drop.  Right leg is without obvious venous thrombus.  There is no venous cord that is palpable in the right leg.  She has a negative Homans sign bilaterally.  She does have a compression stocking on the left leg.  Neurological exam shows the foot drop with the left foot.  Skin exam shows no rashes, ecchymoses or petechia.    Lab Results  Component Value Date   WBC 5.4 08/24/2017   HGB 11.7 08/24/2017   HCT 36.7 08/24/2017   MCV 86.2 08/24/2017   PLT 223 08/24/2017   Lab Results  Component Value Date   FERRITIN 50 07/13/2017   IRON 37 (L) 07/13/2017   TIBC 250 07/13/2017   UIBC 213 07/13/2017   IRONPCTSAT 15 (L) 07/13/2017   Lab Results  Component Value Date   RETICCTPCT 0.9 05/11/2014   RBC 4.26 08/24/2017   RETICCTABS 39.7 05/11/2014   No results found for: KPAFRELGTCHN, LAMBDASER, KAPLAMBRATIO No results found for: Kandis Cocking, IGMSERUM No results found for: Odetta Pink, SPEI   Chemistry      Component Value  Date/Time   NA 140 07/13/2017 1152   NA 144 04/13/2017 1134   NA 142 07/28/2016 1144   K 3.5 07/13/2017 1152   K 3.3 04/13/2017 1134   K 3.6 07/28/2016 1144   CL 97 (L) 07/13/2017 1152   CL 97 (L) 04/13/2017 1134   CO2 31 (H) 07/13/2017 1152   CO2 32 04/13/2017 1134   CO2 33 (H) 07/28/2016 1144   BUN 18 07/13/2017 1152   BUN 18 04/13/2017 1134   BUN 22.6 07/28/2016 1144   CREATININE 1.26 (H) 07/13/2017 1152   CREATININE 1.0 04/13/2017 1134   CREATININE 1.0 07/28/2016 1144      Component Value Date/Time   CALCIUM 10.0 07/13/2017 1152   CALCIUM 10.1 04/13/2017 1134   CALCIUM 9.5 07/28/2016 1144   ALKPHOS 103 07/13/2017 1152   ALKPHOS 103 (H) 04/13/2017 1134   ALKPHOS 84 07/28/2016 1144   AST 27 07/13/2017 1152   AST 24 07/28/2016 1144   ALT 18 07/13/2017 1152   ALT 27 04/13/2017 1134   ALT 17 07/28/2016 1144   BILITOT 0.7 07/13/2017 1152   BILITOT 0.41 07/28/2016 1144     Impression and Plan: Ms. Duquette is 77 yo white female with a chronic DVT of the right leg. She is on aspirin.  right now, everything looks better.  Everything is holding stable from my perspective.  I will plan to get her back to see [place] now in 3 months.  I think this would be reasonable.  I told her to let us know if she is going to have surgery for her right knee.   Volanda Napoleon, MD 4/29/201910:10 AM

## 2017-08-25 ENCOUNTER — Telehealth: Payer: Self-pay | Admitting: *Deleted

## 2017-08-25 NOTE — Telephone Encounter (Addendum)
Patient is aware of results  ----- Message from Volanda Napoleon, MD sent at 08/24/2017  2:58 PM EDT ----- Call - ferritin is only 18!!  NO need for phlebotomy!!  [name]

## 2017-09-01 DIAGNOSIS — M48062 Spinal stenosis, lumbar region with neurogenic claudication: Secondary | ICD-10-CM | POA: Diagnosis not present

## 2017-09-16 DIAGNOSIS — R109 Unspecified abdominal pain: Secondary | ICD-10-CM | POA: Diagnosis not present

## 2017-09-22 DIAGNOSIS — I10 Essential (primary) hypertension: Secondary | ICD-10-CM | POA: Diagnosis not present

## 2017-09-22 DIAGNOSIS — M4316 Spondylolisthesis, lumbar region: Secondary | ICD-10-CM | POA: Diagnosis not present

## 2017-09-22 DIAGNOSIS — M545 Low back pain: Secondary | ICD-10-CM | POA: Diagnosis not present

## 2017-09-22 DIAGNOSIS — M4150 Other secondary scoliosis, site unspecified: Secondary | ICD-10-CM | POA: Diagnosis not present

## 2017-10-02 DIAGNOSIS — M48062 Spinal stenosis, lumbar region with neurogenic claudication: Secondary | ICD-10-CM | POA: Diagnosis not present

## 2017-11-01 DIAGNOSIS — M48062 Spinal stenosis, lumbar region with neurogenic claudication: Secondary | ICD-10-CM | POA: Diagnosis not present

## 2017-11-04 DIAGNOSIS — E7800 Pure hypercholesterolemia, unspecified: Secondary | ICD-10-CM | POA: Diagnosis not present

## 2017-11-04 DIAGNOSIS — D508 Other iron deficiency anemias: Secondary | ICD-10-CM | POA: Diagnosis not present

## 2017-11-04 DIAGNOSIS — F329 Major depressive disorder, single episode, unspecified: Secondary | ICD-10-CM | POA: Diagnosis not present

## 2017-11-04 DIAGNOSIS — R600 Localized edema: Secondary | ICD-10-CM | POA: Diagnosis not present

## 2017-11-04 DIAGNOSIS — G250 Essential tremor: Secondary | ICD-10-CM | POA: Diagnosis not present

## 2017-11-04 DIAGNOSIS — N951 Menopausal and female climacteric states: Secondary | ICD-10-CM | POA: Diagnosis not present

## 2017-11-04 DIAGNOSIS — M1A0720 Idiopathic chronic gout, left ankle and foot, without tophus (tophi): Secondary | ICD-10-CM | POA: Diagnosis not present

## 2017-11-04 DIAGNOSIS — N183 Chronic kidney disease, stage 3 (moderate): Secondary | ICD-10-CM | POA: Diagnosis not present

## 2017-11-04 DIAGNOSIS — I10 Essential (primary) hypertension: Secondary | ICD-10-CM | POA: Diagnosis not present

## 2017-11-04 DIAGNOSIS — G259 Extrapyramidal and movement disorder, unspecified: Secondary | ICD-10-CM | POA: Diagnosis not present

## 2017-11-04 DIAGNOSIS — K210 Gastro-esophageal reflux disease with esophagitis: Secondary | ICD-10-CM | POA: Diagnosis not present

## 2017-11-10 ENCOUNTER — Other Ambulatory Visit: Payer: Self-pay | Admitting: Family Medicine

## 2017-11-10 DIAGNOSIS — Z1231 Encounter for screening mammogram for malignant neoplasm of breast: Secondary | ICD-10-CM

## 2017-11-23 ENCOUNTER — Inpatient Hospital Stay: Payer: PPO | Attending: Family | Admitting: Family

## 2017-11-23 ENCOUNTER — Other Ambulatory Visit: Payer: Self-pay

## 2017-11-23 ENCOUNTER — Encounter: Payer: Self-pay | Admitting: Family

## 2017-11-23 ENCOUNTER — Inpatient Hospital Stay: Payer: PPO

## 2017-11-23 DIAGNOSIS — I82401 Acute embolism and thrombosis of unspecified deep veins of right lower extremity: Secondary | ICD-10-CM | POA: Diagnosis not present

## 2017-11-23 DIAGNOSIS — D500 Iron deficiency anemia secondary to blood loss (chronic): Secondary | ICD-10-CM

## 2017-11-23 DIAGNOSIS — D6852 Prothrombin gene mutation: Secondary | ICD-10-CM

## 2017-11-23 LAB — CBC WITH DIFFERENTIAL (CANCER CENTER ONLY)
Basophils Absolute: 0 10*3/uL (ref 0.0–0.1)
Basophils Relative: 1 %
EOS ABS: 0.3 10*3/uL (ref 0.0–0.5)
EOS PCT: 6 %
HCT: 32.8 % — ABNORMAL LOW (ref 34.8–46.6)
Hemoglobin: 10.2 g/dL — ABNORMAL LOW (ref 11.6–15.9)
LYMPHS ABS: 1.3 10*3/uL (ref 0.9–3.3)
LYMPHS PCT: 25 %
MCH: 26 pg (ref 26.0–34.0)
MCHC: 31.1 g/dL — AB (ref 32.0–36.0)
MCV: 83.5 fL (ref 81.0–101.0)
MONOS PCT: 11 %
Monocytes Absolute: 0.6 10*3/uL (ref 0.1–0.9)
Neutro Abs: 2.9 10*3/uL (ref 1.5–6.5)
Neutrophils Relative %: 57 %
PLATELETS: 216 10*3/uL (ref 145–400)
RBC: 3.93 MIL/uL (ref 3.70–5.32)
RDW: 15.8 % — ABNORMAL HIGH (ref 11.1–15.7)
WBC Count: 5.1 10*3/uL (ref 3.9–10.0)

## 2017-11-23 LAB — CMP (CANCER CENTER ONLY)
ALK PHOS: 109 U/L (ref 38–126)
ALT: 20 U/L (ref 0–44)
ANION GAP: 9 (ref 5–15)
AST: 24 U/L (ref 15–41)
Albumin: 3.4 g/dL — ABNORMAL LOW (ref 3.5–5.0)
BUN: 19 mg/dL (ref 8–23)
CALCIUM: 9.4 mg/dL (ref 8.9–10.3)
CO2: 31 mmol/L (ref 22–32)
CREATININE: 1.02 mg/dL — AB (ref 0.44–1.00)
Chloride: 101 mmol/L (ref 98–111)
GFR, EST AFRICAN AMERICAN: 60 mL/min — AB (ref >60)
GFR, EST NON AFRICAN AMERICAN: 52 mL/min — AB (ref >60)
Glucose, Bld: 95 mg/dL (ref 70–99)
Potassium: 3.5 mmol/L (ref 3.5–5.1)
Sodium: 141 mmol/L (ref 135–145)
Total Bilirubin: 0.4 mg/dL (ref 0.3–1.2)
Total Protein: 7.3 g/dL (ref 6.5–8.1)

## 2017-11-23 LAB — IRON AND TIBC
IRON: 49 ug/dL (ref 41–142)
Saturation Ratios: 17 % — ABNORMAL LOW (ref 21–57)
TIBC: 280 ug/dL (ref 236–444)
UIBC: 231 ug/dL

## 2017-11-23 LAB — FERRITIN: FERRITIN: 17 ng/mL (ref 11–307)

## 2017-11-23 NOTE — Progress Notes (Signed)
Hematology and Oncology Follow Up Visit  [name] [id number] 31-Jul-1940 77 y.o. 11/23/2017   Principle Diagnosis:  DVT of the right leg Prothrombin II gene mutation Hemachromatosis (homozygous for C282Y Mutation) Past history of right lower extremity DVT/PE Iron deficiency anemia secondary to blood loss.  Current Therapy:   Aspirin 162 mg PO daily IV iron - Feraheme dose given 02/2017   Interim History:  [name] is here today for follow-up. She is doing well but still having issues with the left lower extremity since having back surgery. The swelling has improved with her wearing a compression stocking. She still has foot drop and wears a brace at home as needed.  She received a steroid injection in her right knee a while back with ortho and this seemed to help. She uses her walker when ambulating for support. No falls or syncopal episodes to report.  She verbalized that she is taking her 2 baby aspirin daily as prescribed. No episodes of bleeding. She does bruise easily but not in excess.  She has had no issue with infections. No fever, chills, n/v, cough, rash, dizziness, chest pain, palpitations, abdominal pain or changes in bowel habits.  She is on 2 diuretics and does have urinary frequency.  She notices some SOB when bending over. She attributes this to her hernia and weight.  No numbness or tingling in her extremities at this times. Pedal pulses are 2+.  She has a good appetite and is staying well hydrated. Her weight is stable.   ECOG Performance Status: 1 - Symptomatic but completely ambulatory  Medications:  Allergies as of 11/23/2017      Reactions   Penicillins Anaphylaxis   Pt was 89 or 77 years old  Has patient had a PCN reaction causing immediate rash, facial/tongue/throat swelling, SOB or lightheadedness with hypotension: Yes Has patient had a PCN reaction causing severe rash involving mucus membranes or skin necrosis: Unknown Has patient had a PCN reaction  that required hospitalization: No Has patient had a PCN reaction occurring within the last 10 years: No If all of the above answers are "NO", then may proceed with Cephalosporin use.   Morphine    Sulfa Antibiotics Other (See Comments)   unknown   Morphine And Related Other (See Comments)   Twitching in large doses       Medication List        Accurate as of 11/23/17  9:24 AM. Always use your most recent med list.          amLODipine 10 MG tablet Commonly known as:  NORVASC   ARTIFICIAL TEARS OP Place 1 drop into both eyes 3 (three) times daily as needed (dry eyes).   Biotin 5000 MCG Tabs Take 5,000 mcg by mouth daily.   CALCIUM 600+D PO Take 1 tablet by mouth 2 (two) times daily.   citalopram 20 MG tablet Commonly known as:  CELEXA Take 20 mg by mouth at bedtime.   colchicine 0.6 MG tablet Take 1 tablet (0.6 mg total) by mouth daily. Gout flare up.   cyclobenzaprine 10 MG tablet Commonly known as:  FLEXERIL Take 0.5 tablets (5 mg total) by mouth 2 (two) times daily as needed for muscle spasms.   ferrous sulfate 325 (65 FE) MG tablet Take 325 mg 2 (two) times daily with a meal by mouth.   fish oil-omega-3 fatty acids 1000 MG capsule Take 1 g by mouth daily.   furosemide 20 MG tablet Commonly known as:  LASIX  hydrochlorothiazide 25 MG tablet Commonly known as:  HYDRODIURIL Take 25 mg by mouth daily.   lidocaine 5 % Commonly known as:  LIDODERM Place 1 patch onto the skin daily. Apply to right shoulder and left knee at 8 am and remove at 8 pm daily (has to be off for 12 hours daily) Can be purchased over the counter.   multivitamin with minerals Tabs tablet Take 1 tablet by mouth daily.   naproxen 500 MG tablet Commonly known as:  NAPROSYN Take 1 tablet (500 mg total) by mouth 2 (two) times daily.   oxyCODONE-acetaminophen 5-325 MG tablet Commonly known as:  PERCOCET/ROXICET   pantoprazole 40 MG tablet Commonly known as:  PROTONIX Take 40 mg by  mouth daily before supper.   potassium chloride SA 20 MEQ tablet Commonly known as:  K-DUR,KLOR-CON Take 1 tablet (20 mEq total) by mouth 2 (two) times daily.   pramipexole 0.5 MG tablet Commonly known as:  MIRAPEX Take 0.5 mg by mouth every evening.   propranolol 10 MG tablet Commonly known as:  INDERAL   RESTASIS 0.05 % ophthalmic emulsion Generic drug:  cycloSPORINE Place 1 drop into both eyes 2 (two) times daily.   temazepam 15 MG capsule Commonly known as:  RESTORIL Take 1 capsule (15 mg total) by mouth at bedtime.   traMADol 50 MG tablet Commonly known as:  ULTRAM Take 1 tablet (50 mg total) by mouth every 6 (six) hours as needed for moderate pain or severe pain.   Turmeric Curcumin 500 MG Caps Take 500 mg by mouth at bedtime.   Vitamin D 2000 units tablet Take 2,000 Units by mouth 2 (two) times daily.       Allergies:  Allergies  Allergen Reactions  . Penicillins Anaphylaxis    Pt was 32 or 77 years old  Has patient had a PCN reaction causing immediate rash, facial/tongue/throat swelling, SOB or lightheadedness with hypotension: Yes Has patient had a PCN reaction causing severe rash involving mucus membranes or skin necrosis: Unknown Has patient had a PCN reaction that required hospitalization: No Has patient had a PCN reaction occurring within the last 10 years: No If all of the above answers are "NO", then may proceed with Cephalosporin use.   Marland Kitchen Morphine   . Sulfa Antibiotics Other (See Comments)    unknown  . Morphine And Related Other (See Comments)    Twitching in large doses     Past Medical History, Surgical history, Social history, and Family History were reviewed and updated.  Review of Systems: All other 10 point review of systems is negative.   Physical Exam:  weight is 188 lb (85.3 kg). Her oral temperature is 98.2 F (36.8 C). Her blood pressure is 151/79 (abnormal) and her pulse is 66. Her respiration is 20 and oxygen saturation is 97%.    Wt Readings from Last 3 Encounters:  11/23/17 188 lb (85.3 kg)  08/24/17 183 lb (83 kg)  07/13/17 185 lb (83.9 kg)    Ocular: Sclerae unicteric, pupils equal, round and reactive to light Ear-nose-throat: Oropharynx clear, dentition fair Lymphatic: No cervical, supraclavicular or axillary adenopathy Lungs no rales or rhonchi, good excursion bilaterally Heart regular rate and rhythm, no murmur appreciated Abd soft, nontender, positive bowel sounds, no liver or spleen tip palpated on exam, no fluid wave  MSK no focal spinal tenderness, no joint edema Neuro: non-focal, well-oriented, appropriate affect Breasts: Deferred   Lab Results  Component Value Date   WBC 5.4 08/24/2017  HGB 11.7 08/24/2017   HCT 36.7 08/24/2017   MCV 86.2 08/24/2017   PLT 223 08/24/2017   Lab Results  Component Value Date   FERRITIN 18 08/24/2017   IRON 45 08/24/2017   TIBC 281 08/24/2017   UIBC 236 08/24/2017   IRONPCTSAT 16 (L) 08/24/2017   Lab Results  Component Value Date   RETICCTPCT 0.9 05/11/2014   RBC 4.26 08/24/2017   RETICCTABS 39.7 05/11/2014   No results found for: KPAFRELGTCHN, LAMBDASER, KAPLAMBRATIO No results found for: Kandis Cocking, IGMSERUM No results found for: Odetta Pink, SPEI   Chemistry      Component Value Date/Time   NA 142 08/24/2017 0910   NA 144 04/13/2017 1134   NA 142 07/28/2016 1144   K 3.3 (L) 08/24/2017 0910   K 3.3 04/13/2017 1134   K 3.6 07/28/2016 1144   CL 102 08/24/2017 0910   CL 97 (L) 04/13/2017 1134   CO2 30 (H) 08/24/2017 0910   CO2 32 04/13/2017 1134   CO2 33 (H) 07/28/2016 1144   BUN 28 (H) 08/24/2017 0910   BUN 18 04/13/2017 1134   BUN 22.6 07/28/2016 1144   CREATININE 1.31 (H) 08/24/2017 0910   CREATININE 1.0 04/13/2017 1134   CREATININE 1.0 07/28/2016 1144      Component Value Date/Time   CALCIUM 9.6 08/24/2017 0910   CALCIUM 10.1 04/13/2017 1134   CALCIUM 9.5 07/28/2016 1144    ALKPHOS 96 08/24/2017 0910   ALKPHOS 103 (H) 04/13/2017 1134   ALKPHOS 84 07/28/2016 1144   AST 27 08/24/2017 0910   AST 24 07/28/2016 1144   ALT 21 08/24/2017 0910   ALT 27 04/13/2017 1134   ALT 17 07/28/2016 1144   BILITOT 0.3 08/24/2017 0910   BILITOT 0.41 07/28/2016 1144      Impression and Plan: Ms. Lepkowski is a very pleasant 77 yo caucasian female with a chronic DVT of the right lower extremity. She continues to do well on aspirin and so far there has been no evidence of recurrence.  She will continue her same regimen with 2 baby aspirin daily.  We will plan to see her back in another 4 months for follow-up.  She will contact our office with any questions or concerns. We can certainly see her sooner if need be.   Laverna Peace, NP 7/29/20199:24 AM

## 2017-11-25 ENCOUNTER — Telehealth: Payer: Self-pay | Admitting: *Deleted

## 2017-11-25 NOTE — Telephone Encounter (Addendum)
  Patient aware of results. Appointment made  ----- Message from Volanda Napoleon, MD sent at 11/23/2017  2:52 PM EDT ----- Call - the iron level is low.  She needs a dose of IV Feraheme.  Please set this up.  Laurey Arrow

## 2017-11-26 ENCOUNTER — Inpatient Hospital Stay: Payer: PPO | Attending: Hematology & Oncology

## 2017-11-26 VITALS — BP 143/66 | HR 61 | Temp 98.4°F | Resp 18

## 2017-11-26 DIAGNOSIS — Z79899 Other long term (current) drug therapy: Secondary | ICD-10-CM | POA: Diagnosis not present

## 2017-11-26 DIAGNOSIS — D500 Iron deficiency anemia secondary to blood loss (chronic): Secondary | ICD-10-CM

## 2017-11-26 DIAGNOSIS — D62 Acute posthemorrhagic anemia: Secondary | ICD-10-CM

## 2017-11-26 MED ORDER — FERUMOXYTOL INJECTION 510 MG/17 ML
510.0000 mg | Freq: Once | INTRAVENOUS | Status: AC
  Administered 2017-11-26: 510 mg via INTRAVENOUS
  Filled 2017-11-26: qty 17

## 2017-11-26 MED ORDER — SODIUM CHLORIDE 0.9 % IV SOLN
Freq: Once | INTRAVENOUS | Status: AC
  Administered 2017-11-26: 13:00:00 via INTRAVENOUS
  Filled 2017-11-26: qty 250

## 2017-11-26 NOTE — Patient Instructions (Signed)
ferFerumoxytol injection What is this medicine? FERUMOXYTOL is an iron complex. Iron is used to make healthy red blood cells, which carry oxygen and nutrients throughout the body. This medicine is used to treat iron deficiency anemia in people with chronic kidney disease. This medicine may be used for other purposes; ask your health care provider or pharmacist if you have questions. COMMON BRAND NAME(S): Feraheme What should I tell my health care provider before I take this medicine? They need to know if you have any of these conditions: -anemia not caused by low iron levels -high levels of iron in the blood -magnetic resonance imaging (MRI) test scheduled -an unusual or allergic reaction to iron, other medicines, foods, dyes, or preservatives -pregnant or trying to get pregnant -breast-feeding How should I use this medicine? This medicine is for injection into a vein. It is given by a health care professional in a hospital or clinic setting. Talk to your pediatrician regarding the use of this medicine in children. Special care may be needed. Overdosage: If you think you have taken too much of this medicine contact a poison control center or emergency room at once. NOTE: This medicine is only for you. Do not share this medicine with others. What if I miss a dose? It is important not to miss your dose. Call your doctor or health care professional if you are unable to keep an appointment. What may interact with this medicine? This medicine may interact with the following medications: -other iron products This list may not describe all possible interactions. Give your health care provider a list of all the medicines, herbs, non-prescription drugs, or dietary supplements you use. Also tell them if you smoke, drink alcohol, or use illegal drugs. Some items may interact with your medicine. What should I watch for while using this medicine? Visit your doctor or healthcare professional regularly.  Tell your doctor or healthcare professional if your symptoms do not start to get better or if they get worse. You may need blood work done while you are taking this medicine. You may need to follow a special diet. Talk to your doctor. Foods that contain iron include: whole grains/cereals, dried fruits, beans, or peas, leafy green vegetables, and organ meats (liver, kidney). What side effects may I notice from receiving this medicine? Side effects that you should report to your doctor or health care professional as soon as possible: -allergic reactions like skin rash, itching or hives, swelling of the face, lips, or tongue -breathing problems -changes in blood pressure -feeling faint or lightheaded, falls -fever or chills -flushing, sweating, or hot feelings -swelling of the ankles or feet Side effects that usually do not require medical attention (report to your doctor or health care professional if they continue or are bothersome): -diarrhea -headache -nausea, vomiting -stomach pain This list may not describe all possible side effects. Call your doctor for medical advice about side effects. You may report side effects to FDA at 1-800-FDA-1088. Where should I keep my medicine? This drug is given in a hospital or clinic and will not be stored at home. NOTE: This sheet is a summary. It may not cover all possible information. If you have questions about this medicine, talk to your doctor, pharmacist, or health care provider.  2018 Elsevier/Gold Standard (2015-05-17 12:41:49)

## 2017-12-02 DIAGNOSIS — M48062 Spinal stenosis, lumbar region with neurogenic claudication: Secondary | ICD-10-CM | POA: Diagnosis not present

## 2017-12-14 ENCOUNTER — Ambulatory Visit
Admission: RE | Admit: 2017-12-14 | Discharge: 2017-12-14 | Disposition: A | Discharge status: Home / Self Care | Payer: PPO | Source: Ambulatory Visit | Attending: Family Medicine | Admitting: Family Medicine

## 2017-12-14 DIAGNOSIS — Z1231 Encounter for screening mammogram for malignant neoplasm of breast: Secondary | ICD-10-CM | POA: Diagnosis not present

## 2017-12-14 DIAGNOSIS — M1711 Unilateral primary osteoarthritis, right knee: Secondary | ICD-10-CM | POA: Diagnosis not present

## 2017-12-14 DIAGNOSIS — Z86718 Personal history of other venous thrombosis and embolism: Secondary | ICD-10-CM | POA: Diagnosis not present

## 2017-12-14 DIAGNOSIS — M1712 Unilateral primary osteoarthritis, left knee: Secondary | ICD-10-CM | POA: Diagnosis not present

## 2017-12-14 DIAGNOSIS — M170 Bilateral primary osteoarthritis of knee: Secondary | ICD-10-CM | POA: Diagnosis not present

## 2017-12-14 DIAGNOSIS — Z86711 Personal history of pulmonary embolism: Secondary | ICD-10-CM | POA: Diagnosis not present

## 2017-12-22 DIAGNOSIS — M79672 Pain in left foot: Secondary | ICD-10-CM | POA: Diagnosis not present

## 2017-12-22 DIAGNOSIS — M545 Low back pain: Secondary | ICD-10-CM | POA: Diagnosis not present

## 2017-12-22 DIAGNOSIS — M1711 Unilateral primary osteoarthritis, right knee: Secondary | ICD-10-CM | POA: Diagnosis not present

## 2017-12-29 DIAGNOSIS — M1711 Unilateral primary osteoarthritis, right knee: Secondary | ICD-10-CM | POA: Diagnosis not present

## 2018-01-20 DIAGNOSIS — M21372 Foot drop, left foot: Secondary | ICD-10-CM | POA: Diagnosis not present

## 2018-01-20 DIAGNOSIS — M549 Dorsalgia, unspecified: Secondary | ICD-10-CM | POA: Diagnosis not present

## 2018-02-03 DIAGNOSIS — J40 Bronchitis, not specified as acute or chronic: Secondary | ICD-10-CM | POA: Diagnosis not present

## 2018-02-23 DIAGNOSIS — M79604 Pain in right leg: Secondary | ICD-10-CM | POA: Diagnosis not present

## 2018-03-05 DIAGNOSIS — I10 Essential (primary) hypertension: Secondary | ICD-10-CM | POA: Diagnosis not present

## 2018-03-05 DIAGNOSIS — Z6833 Body mass index (BMI) 33.0-33.9, adult: Secondary | ICD-10-CM | POA: Diagnosis not present

## 2018-03-05 DIAGNOSIS — M545 Low back pain: Secondary | ICD-10-CM | POA: Diagnosis not present

## 2018-03-15 ENCOUNTER — Inpatient Hospital Stay: Payer: PPO

## 2018-03-15 ENCOUNTER — Other Ambulatory Visit (HOSPITAL_BASED_OUTPATIENT_CLINIC_OR_DEPARTMENT_OTHER): Payer: PPO

## 2018-03-15 ENCOUNTER — Inpatient Hospital Stay: Payer: PPO | Attending: Family | Admitting: Family

## 2018-03-15 VITALS — BP 153/72 | HR 63 | Temp 98.2°F | Resp 18 | Ht 62.5 in | Wt 186.8 lb

## 2018-03-15 DIAGNOSIS — D500 Iron deficiency anemia secondary to blood loss (chronic): Secondary | ICD-10-CM | POA: Insufficient documentation

## 2018-03-15 DIAGNOSIS — Z7982 Long term (current) use of aspirin: Secondary | ICD-10-CM | POA: Diagnosis not present

## 2018-03-15 DIAGNOSIS — I82401 Acute embolism and thrombosis of unspecified deep veins of right lower extremity: Secondary | ICD-10-CM | POA: Insufficient documentation

## 2018-03-15 DIAGNOSIS — Z79899 Other long term (current) drug therapy: Secondary | ICD-10-CM | POA: Diagnosis not present

## 2018-03-15 DIAGNOSIS — M79604 Pain in right leg: Secondary | ICD-10-CM

## 2018-03-15 DIAGNOSIS — D6852 Prothrombin gene mutation: Secondary | ICD-10-CM

## 2018-03-15 DIAGNOSIS — M7989 Other specified soft tissue disorders: Secondary | ICD-10-CM

## 2018-03-15 LAB — CBC WITH DIFFERENTIAL (CANCER CENTER ONLY)
Abs Immature Granulocytes: 0.02 10*3/uL (ref 0.00–0.07)
Basophils Absolute: 0 10*3/uL (ref 0.0–0.1)
Basophils Relative: 1 %
Eosinophils Absolute: 0.2 10*3/uL (ref 0.0–0.5)
Eosinophils Relative: 4 %
HCT: 40.7 % (ref 36.0–46.0)
Hemoglobin: 12.9 g/dL (ref 12.0–15.0)
Immature Granulocytes: 0 %
Lymphocytes Relative: 26 %
Lymphs Abs: 1.4 10*3/uL (ref 0.7–4.0)
MCH: 27.7 pg (ref 26.0–34.0)
MCHC: 31.7 g/dL (ref 30.0–36.0)
MCV: 87.3 fL (ref 80.0–100.0)
Monocytes Absolute: 0.5 10*3/uL (ref 0.1–1.0)
Monocytes Relative: 10 %
Neutro Abs: 3 10*3/uL (ref 1.7–7.7)
Neutrophils Relative %: 59 %
Platelet Count: 204 10*3/uL (ref 150–400)
RBC: 4.66 MIL/uL (ref 3.87–5.11)
RDW: 15 % (ref 11.5–15.5)
WBC Count: 5.2 10*3/uL (ref 4.0–10.5)
nRBC: 0 % (ref 0.0–0.2)

## 2018-03-15 LAB — CMP (CANCER CENTER ONLY)
ALT: 20 U/L (ref 0–44)
ANION GAP: 9 (ref 5–15)
AST: 28 U/L (ref 15–41)
Albumin: 3.7 g/dL (ref 3.5–5.0)
Alkaline Phosphatase: 110 U/L (ref 38–126)
BUN: 19 mg/dL (ref 8–23)
CHLORIDE: 100 mmol/L (ref 98–111)
CO2: 32 mmol/L (ref 22–32)
Calcium: 10 mg/dL (ref 8.9–10.3)
Creatinine: 1.14 mg/dL — ABNORMAL HIGH (ref 0.44–1.00)
GFR, EST AFRICAN AMERICAN: 52 mL/min — AB (ref >60)
GFR, Estimated: 45 mL/min — ABNORMAL LOW (ref >60)
GLUCOSE: 87 mg/dL (ref 70–99)
Potassium: 3.4 mmol/L — ABNORMAL LOW (ref 3.5–5.1)
SODIUM: 141 mmol/L (ref 135–145)
Total Bilirubin: 0.6 mg/dL (ref 0.3–1.2)
Total Protein: 7.6 g/dL (ref 6.5–8.1)

## 2018-03-15 NOTE — Progress Notes (Signed)
Hematology and Oncology Follow Up Visit  [name] [id number] 12-12-1940 77 y.o. 03/15/2018   Principle Diagnosis:  DVT of the right leg Prothrombin II gene mutation Hemachromatosis (homozygous for C282Y Mutation) Past history of right lower extremity DVT/PE Iron deficiency anemia secondary to blood loss.  Current Therapy:   Aspirin 162 mg PO daily IV iron - Feraheme dose given 02/2017   Interim History: [name] is here today for follow-up. She is doing fairly well and staying busy helping with her husband who has a hernia.  She is walking with her rolling walker and has no falls or syncopal episodes to report.  She has swelling in both lower extremities that waxes and wanes. She has noticed this seems increased in her right lower extremity. Pedal pulses are 2+.  She is taking her 2 baby aspirin daily as prescribed.  She is still having arthritic pain in her hands knees and back.  No episodes of bleeding, no bruising or petechiae.  No lymphadenopathy noted on exam.  No fever, chills, n/v, cough, rash, dizziness, SOB, chest pain, palpitations, abdominal pain or changes in bowel or bladder habits.  She has maintained a good appetite and is staying well hydrated. Her weight is stable.   ECOG Performance Status: 1 - Symptomatic but completely ambulatory  Medications:  Allergies as of 03/15/2018      Reactions   Penicillins Anaphylaxis   Pt was 29 or 77 years old  Has patient had a PCN reaction causing immediate rash, facial/tongue/throat swelling, SOB or lightheadedness with hypotension: Yes Has patient had a PCN reaction causing severe rash involving mucus membranes or skin necrosis: Unknown Has patient had a PCN reaction that required hospitalization: No Has patient had a PCN reaction occurring within the last 10 years: No If all of the above answers are "NO", then may proceed with Cephalosporin use.   Morphine    Sulfa Antibiotics Other (See Comments)   unknown   Morphine And Related Other (See Comments)   Twitching in large doses       Medication List        Accurate as of 03/15/18 11:31 AM. Always use your most recent med list.          amLODipine 10 MG tablet Commonly known as:  NORVASC   ARTIFICIAL TEARS OP Place 1 drop into both eyes 3 (three) times daily as needed (dry eyes).   Biotin 5000 MCG Tabs Take 5,000 mcg by mouth daily.   CALCIUM 600+D PO Take 1 tablet by mouth 2 (two) times daily.   citalopram 20 MG tablet Commonly known as:  CELEXA Take 20 mg by mouth at bedtime.   colchicine 0.6 MG tablet Take 1 tablet (0.6 mg total) by mouth daily. Gout flare up.   cyclobenzaprine 10 MG tablet Commonly known as:  FLEXERIL Take 0.5 tablets (5 mg total) by mouth 2 (two) times daily as needed for muscle spasms.   ferrous sulfate 325 (65 FE) MG tablet Take 325 mg 2 (two) times daily with a meal by mouth.   fish oil-omega-3 fatty acids 1000 MG capsule Take 1 g by mouth daily.   furosemide 20 MG tablet Commonly known as:  LASIX   hydrochlorothiazide 25 MG tablet Commonly known as:  HYDRODIURIL Take 25 mg by mouth daily.   lidocaine 5 % Commonly known as:  LIDODERM Place 1 patch onto the skin daily. Apply to right shoulder and left knee at 8 am and remove at 8 pm daily (  has to be off for 12 hours daily) Can be purchased over the counter.   multivitamin with minerals Tabs tablet Take 1 tablet by mouth daily.   naproxen 500 MG tablet Commonly known as:  NAPROSYN Take 1 tablet (500 mg total) by mouth 2 (two) times daily.   oxyCODONE-acetaminophen 5-325 MG tablet Commonly known as:  PERCOCET/ROXICET   pantoprazole 40 MG tablet Commonly known as:  PROTONIX Take 40 mg by mouth daily before supper.   potassium chloride SA 20 MEQ tablet Commonly known as:  K-DUR,KLOR-CON Take 1 tablet (20 mEq total) by mouth 2 (two) times daily.   pramipexole 0.5 MG tablet Commonly known as:  MIRAPEX Take 0.5 mg by mouth every  evening.   propranolol 10 MG tablet Commonly known as:  INDERAL   RESTASIS 0.05 % ophthalmic emulsion Generic drug:  cycloSPORINE Place 1 drop into both eyes 2 (two) times daily.   temazepam 15 MG capsule Commonly known as:  RESTORIL Take 1 capsule (15 mg total) by mouth at bedtime.   Turmeric Curcumin 500 MG Caps Take 500 mg by mouth at bedtime.   Vitamin D 50 MCG (2000 UT) tablet Take 2,000 Units by mouth 2 (two) times daily.       Allergies:  Allergies  Allergen Reactions  . Penicillins Anaphylaxis    Pt was 54 or 77 years old  Has patient had a PCN reaction causing immediate rash, facial/tongue/throat swelling, SOB or lightheadedness with hypotension: Yes Has patient had a PCN reaction causing severe rash involving mucus membranes or skin necrosis: Unknown Has patient had a PCN reaction that required hospitalization: No Has patient had a PCN reaction occurring within the last 10 years: No If all of the above answers are "NO", then may proceed with Cephalosporin use.   Marland Kitchen Morphine   . Sulfa Antibiotics Other (See Comments)    unknown  . Morphine And Related Other (See Comments)    Twitching in large doses     Past Medical History, Surgical history, Social history, and Family History were reviewed and updated.  Review of Systems: All other 10 point review of systems is negative.   Physical Exam:  height is 5' 2.5" (1.588 m) and weight is 186 lb 12.8 oz (84.7 kg). Her oral temperature is 98.2 F (36.8 C). Her blood pressure is 153/72 (abnormal) and her pulse is 63. Her respiration is 18 and oxygen saturation is 97%.   Wt Readings from Last 3 Encounters:  03/15/18 186 lb 12.8 oz (84.7 kg)  11/23/17 188 lb (85.3 kg)  08/24/17 183 lb (83 kg)    Ocular: Sclerae unicteric, pupils equal, round and reactive to light Ear-nose-throat: Oropharynx clear, dentition fair Lymphatic: No cervical, supraclavicular or axillary adenopathy Lungs no rales or rhonchi, good excursion  bilaterally Heart regular rate and rhythm, no murmur appreciated Abd soft, nontender, positive bowel sounds, no liver or spleen tip palpated on exam, no fluid wave  MSK no focal spinal tenderness, no joint edema Neuro: non-focal, well-oriented, appropriate affect Breasts: Deferred   Lab Results  Component Value Date   WBC 5.2 03/15/2018   HGB 12.9 03/15/2018   HCT 40.7 03/15/2018   MCV 87.3 03/15/2018   PLT 204 03/15/2018   Lab Results  Component Value Date   FERRITIN 17 11/23/2017   IRON 49 11/23/2017   TIBC 280 11/23/2017   UIBC 231 11/23/2017   IRONPCTSAT 17 (L) 11/23/2017   Lab Results  Component Value Date   RETICCTPCT 0.9 05/11/2014  RBC 4.66 03/15/2018   RETICCTABS 39.7 05/11/2014   No results found for: KPAFRELGTCHN, LAMBDASER, KAPLAMBRATIO No results found for: Kandis Cocking, IGMSERUM No results found for: Odetta Pink, SPEI   Chemistry      Component Value Date/Time   NA 141 11/23/2017 0903   NA 144 04/13/2017 1134   NA 142 07/28/2016 1144   K 3.5 11/23/2017 0903   K 3.3 04/13/2017 1134   K 3.6 07/28/2016 1144   CL 101 11/23/2017 0903   CL 97 (L) 04/13/2017 1134   CO2 31 11/23/2017 0903   CO2 32 04/13/2017 1134   CO2 33 (H) 07/28/2016 1144   BUN 19 11/23/2017 0903   BUN 18 04/13/2017 1134   BUN 22.6 07/28/2016 1144   CREATININE 1.02 (H) 11/23/2017 0903   CREATININE 1.0 04/13/2017 1134   CREATININE 1.0 07/28/2016 1144      Component Value Date/Time   CALCIUM 9.4 11/23/2017 0903   CALCIUM 10.1 04/13/2017 1134   CALCIUM 9.5 07/28/2016 1144   ALKPHOS 109 11/23/2017 0903   ALKPHOS 103 (H) 04/13/2017 1134   ALKPHOS 84 07/28/2016 1144   AST 24 11/23/2017 0903   AST 24 07/28/2016 1144   ALT 20 11/23/2017 0903   ALT 27 04/13/2017 1134   ALT 17 07/28/2016 1144   BILITOT 0.4 11/23/2017 0903   BILITOT 0.41 07/28/2016 1144       Impression and Plan: [name] is a very pleasant 77 yo caucasian  female with a chronic DVT of the right lower extremity. She is tolerating aspirin daily but has noticed increased swelling in the right lower extremity at times. We will get an US of the right leg to better evaluate.  Iron studies are pending. We will bring her back in for infusion if needed.  We will go ahead and plan to see her back in another 6 months.  She will contact our office with any questions or concerns. We can certainly see her sooner if need be.   Laverna Peace, NP 11/18/201911:31 AM

## 2018-03-16 ENCOUNTER — Ambulatory Visit (HOSPITAL_BASED_OUTPATIENT_CLINIC_OR_DEPARTMENT_OTHER)
Admission: RE | Admit: 2018-03-16 | Discharge: 2018-03-16 | Disposition: A | Discharge status: Home / Self Care | Payer: PPO | Source: Ambulatory Visit | Attending: Family | Admitting: Family

## 2018-03-16 DIAGNOSIS — I82401 Acute embolism and thrombosis of unspecified deep veins of right lower extremity: Secondary | ICD-10-CM | POA: Diagnosis present

## 2018-03-16 DIAGNOSIS — I82511 Chronic embolism and thrombosis of right femoral vein: Secondary | ICD-10-CM | POA: Diagnosis not present

## 2018-03-16 DIAGNOSIS — M7989 Other specified soft tissue disorders: Secondary | ICD-10-CM | POA: Insufficient documentation

## 2018-03-16 DIAGNOSIS — R600 Localized edema: Secondary | ICD-10-CM | POA: Diagnosis not present

## 2018-03-16 DIAGNOSIS — M79604 Pain in right leg: Secondary | ICD-10-CM | POA: Insufficient documentation

## 2018-03-16 LAB — IRON AND TIBC
IRON: 99 ug/dL (ref 41–142)
Saturation Ratios: 39 % (ref 21–57)
TIBC: 256 ug/dL (ref 236–444)
UIBC: 157 ug/dL (ref 120–384)

## 2018-03-16 LAB — FERRITIN: Ferritin: 31 ng/mL (ref 11–307)

## 2018-03-18 ENCOUNTER — Telehealth: Payer: Self-pay | Admitting: Family

## 2018-03-18 NOTE — Telephone Encounter (Signed)
I was able to speak with Ms. Panuco and went over her [place] and lab results. She will follow-up with her orthopedist regarding the Baker's cyst. We will get her set up for one dose of IV iron. Scheduling message sent to [facility].

## 2018-03-19 ENCOUNTER — Telehealth: Payer: Self-pay | Admitting: Hematology & Oncology

## 2018-03-19 NOTE — Telephone Encounter (Signed)
Appointment scheduled and I spoke with patient re: date/time per 11/21 staff message

## 2018-03-22 ENCOUNTER — Inpatient Hospital Stay: Payer: PPO

## 2018-03-22 ENCOUNTER — Other Ambulatory Visit: Payer: Self-pay | Admitting: Family

## 2018-03-22 VITALS — BP 139/58 | HR 63 | Temp 97.8°F | Resp 19

## 2018-03-22 DIAGNOSIS — D62 Acute posthemorrhagic anemia: Secondary | ICD-10-CM

## 2018-03-22 DIAGNOSIS — I82401 Acute embolism and thrombosis of unspecified deep veins of right lower extremity: Secondary | ICD-10-CM | POA: Diagnosis not present

## 2018-03-22 DIAGNOSIS — D500 Iron deficiency anemia secondary to blood loss (chronic): Secondary | ICD-10-CM

## 2018-03-22 MED ORDER — HEPARIN SOD (PORK) LOCK FLUSH 100 UNIT/ML IV SOLN
250.0000 [IU] | Freq: Once | INTRAVENOUS | Status: DC | PRN
  Filled 2018-03-22: qty 5

## 2018-03-22 MED ORDER — ALTEPLASE 2 MG IJ SOLR
2.0000 mg | Freq: Once | INTRAMUSCULAR | Status: DC | PRN
  Filled 2018-03-22: qty 2

## 2018-03-22 MED ORDER — SODIUM CHLORIDE 0.9 % IV SOLN
INTRAVENOUS | Status: DC
  Administered 2018-03-22: 14:00:00 via INTRAVENOUS
  Filled 2018-03-22: qty 250

## 2018-03-22 MED ORDER — SODIUM CHLORIDE 0.9 % IV SOLN
510.0000 mg | Freq: Once | INTRAVENOUS | Status: AC
  Administered 2018-03-22: 510 mg via INTRAVENOUS
  Filled 2018-03-22: qty 17

## 2018-03-22 MED ORDER — SODIUM CHLORIDE 0.9% FLUSH
3.0000 mL | Freq: Once | INTRAVENOUS | Status: DC | PRN
  Filled 2018-03-22: qty 10

## 2018-03-22 MED ORDER — SODIUM CHLORIDE 0.9% FLUSH
10.0000 mL | INTRAVENOUS | Status: DC | PRN
  Filled 2018-03-22: qty 10

## 2018-03-22 MED ORDER — HEPARIN SOD (PORK) LOCK FLUSH 100 UNIT/ML IV SOLN
500.0000 [IU] | Freq: Once | INTRAVENOUS | Status: DC | PRN
  Filled 2018-03-22: qty 5

## 2018-03-22 NOTE — Patient Instructions (Signed)

## 2018-03-25 ENCOUNTER — Emergency Department (HOSPITAL_BASED_OUTPATIENT_CLINIC_OR_DEPARTMENT_OTHER): Payer: PPO

## 2018-03-25 ENCOUNTER — Other Ambulatory Visit: Payer: Self-pay

## 2018-03-25 ENCOUNTER — Emergency Department (HOSPITAL_BASED_OUTPATIENT_CLINIC_OR_DEPARTMENT_OTHER)
Admission: EM | Admit: 2018-03-25 | Discharge: 2018-03-25 | Disposition: A | Discharge status: Home / Self Care | Payer: PPO | Attending: Emergency Medicine | Admitting: Emergency Medicine

## 2018-03-25 ENCOUNTER — Encounter (HOSPITAL_BASED_OUTPATIENT_CLINIC_OR_DEPARTMENT_OTHER): Payer: Self-pay | Admitting: *Deleted

## 2018-03-25 DIAGNOSIS — Y999 Unspecified external cause status: Secondary | ICD-10-CM | POA: Diagnosis not present

## 2018-03-25 DIAGNOSIS — S3992XA Unspecified injury of lower back, initial encounter: Secondary | ICD-10-CM | POA: Diagnosis not present

## 2018-03-25 DIAGNOSIS — M25561 Pain in right knee: Secondary | ICD-10-CM | POA: Diagnosis not present

## 2018-03-25 DIAGNOSIS — S0993XA Unspecified injury of face, initial encounter: Secondary | ICD-10-CM

## 2018-03-25 DIAGNOSIS — I10 Essential (primary) hypertension: Secondary | ICD-10-CM | POA: Insufficient documentation

## 2018-03-25 DIAGNOSIS — Y9389 Activity, other specified: Secondary | ICD-10-CM | POA: Insufficient documentation

## 2018-03-25 DIAGNOSIS — R51 Headache: Secondary | ICD-10-CM | POA: Diagnosis not present

## 2018-03-25 DIAGNOSIS — S8991XA Unspecified injury of right lower leg, initial encounter: Secondary | ICD-10-CM | POA: Diagnosis not present

## 2018-03-25 DIAGNOSIS — S01511A Laceration without foreign body of lip, initial encounter: Secondary | ICD-10-CM | POA: Diagnosis not present

## 2018-03-25 DIAGNOSIS — W01198A Fall on same level from slipping, tripping and stumbling with subsequent striking against other object, initial encounter: Secondary | ICD-10-CM | POA: Diagnosis not present

## 2018-03-25 DIAGNOSIS — Z23 Encounter for immunization: Secondary | ICD-10-CM | POA: Diagnosis not present

## 2018-03-25 DIAGNOSIS — M545 Low back pain: Secondary | ICD-10-CM | POA: Diagnosis not present

## 2018-03-25 DIAGNOSIS — S025XXA Fracture of tooth (traumatic), initial encounter for closed fracture: Secondary | ICD-10-CM | POA: Insufficient documentation

## 2018-03-25 DIAGNOSIS — M7989 Other specified soft tissue disorders: Secondary | ICD-10-CM | POA: Diagnosis not present

## 2018-03-25 DIAGNOSIS — Z79899 Other long term (current) drug therapy: Secondary | ICD-10-CM | POA: Insufficient documentation

## 2018-03-25 DIAGNOSIS — J342 Deviated nasal septum: Secondary | ICD-10-CM | POA: Diagnosis not present

## 2018-03-25 DIAGNOSIS — Z8673 Personal history of transient ischemic attack (TIA), and cerebral infarction without residual deficits: Secondary | ICD-10-CM | POA: Insufficient documentation

## 2018-03-25 DIAGNOSIS — S0990XA Unspecified injury of head, initial encounter: Secondary | ICD-10-CM | POA: Diagnosis not present

## 2018-03-25 DIAGNOSIS — Y92000 Kitchen of unspecified non-institutional (private) residence as  the place of occurrence of the external cause: Secondary | ICD-10-CM | POA: Insufficient documentation

## 2018-03-25 DIAGNOSIS — W19XXXA Unspecified fall, initial encounter: Secondary | ICD-10-CM

## 2018-03-25 DIAGNOSIS — S060X0A Concussion without loss of consciousness, initial encounter: Secondary | ICD-10-CM | POA: Diagnosis not present

## 2018-03-25 MED ORDER — TETANUS-DIPHTH-ACELL PERTUSSIS 5-2.5-18.5 LF-MCG/0.5 IM SUSP
0.5000 mL | Freq: Once | INTRAMUSCULAR | Status: DC
  Filled 2018-03-25: qty 0.5

## 2018-03-25 MED ORDER — LIDOCAINE-EPINEPHRINE-TETRACAINE (LET) SOLUTION
3.0000 mL | Freq: Once | NASAL | Status: AC
  Administered 2018-03-25: 3 mL via TOPICAL
  Filled 2018-03-25: qty 3

## 2018-03-25 MED ORDER — LIDOCAINE HCL (PF) 1 % IJ SOLN
5.0000 mL | Freq: Once | INTRAMUSCULAR | Status: DC
  Filled 2018-03-25: qty 5

## 2018-03-25 NOTE — ED Notes (Signed)
Pt in CT.

## 2018-03-25 NOTE — Discharge Instructions (Addendum)
You were seen in the emergency department today after a fall.  Your imaging including a CT of your head, neck, and face as well as the lower back and right knee did not show any fractures or dislocations.  You do have a deviated septum which is likely due to the fall, we would like you to follow-up with your nose and throat (Dr. Erik Obey) in the next 3 days to reevaluate this and discuss further management.  The laceration to your lip was closed with a total of 5 absorbable sutures, please keep these areas clean and dry as best possible.  Please cover these when out in the sun to help avoid scarring.  These stitches will come out on their own.  Would like the wound rechecked and would like you to discuss updating her tetanus a primary care appointment within 3 days.  Discussed with your primary care the importance of receiving the tetanus vaccine as you have had an open wound.  With your teeth injuries please utilize soft diet, no biting, follow up with dentist within the net 3-5 days.   Please return to the ER for new or worsening symptoms including but not limited to redness around the wound, purulent drainage from the wound, inability to keep fluids down, trouble breathing, or any other concerns that you may have.

## 2018-03-25 NOTE — ED Triage Notes (Signed)
She tripped and fell in the kitchen. Laceration to her lower lip and pain in her right knee. She walks with a walker.

## 2018-03-25 NOTE — ED Provider Notes (Signed)
[facility] EMERGENCY DEPARTMENT Provider Note   CSN: [id number] Arrival date & time: 03/25/18  1559     History   Chief Complaint Chief Complaint  Patient presents with  . Fall  . Laceration    HPI [name] is a 77 y.o. female with a hx of anemia, DVT/PE, and HTN who presents to the ED s/p mechanical fall shortly PTA with facial pain, lip laceration, and R knee pain. Patient states that she has chronic L foot drop and has baseline issues with ambulation- states the foot frequently gets stuck behind her resulting in a fall. She states that she was walking and thinks her L foot got stuck leading her to trip and fall forward. Denies prodromal lightheadedness, dizziness, chest pain, or dyspnea. She relays that she hit her mouth/face on the handle of the oven and then fell to the ground, other than facial injury no repeat head injury with floor contact. She denies LOC. She was ultimately able to get up and ambulate w/ her walker following the fall. She states she is having pain primarily to the nose, upper lip, and lower lip w/ the laceration to the lower lip. Pain in these areas is an 8/10 in severity. She also has some mild pain to the anterior R knee and acute on chronic pain to the lower back. No meds PTA. Other than movement no specific alleviating/aggravating factors. Denies syncope, nausea, vomiting, seizure activity, vision change, numbness, weakness, chest pain, or abdominal pain. Patient is unsure of her last tetanus.   HPI  Past Medical History:  Diagnosis Date  . Anemia   . Arthritis   . Bell's palsy   . GERD (gastroesophageal reflux disease)   . Hemochromatosis   . History of hiatal hernia   . Hypertension   . Iron deficiency anemia due to chronic blood loss 03/10/2017  . PE (pulmonary embolism)   . Pneumonia    "walking" pneumonia  . Prothrombin gene mutation ([place]) 05/30/2013  . Restless legs   . Right leg DVT ([place]) 05/30/2013  . Stroke [facility])    found on  a MRI, she's not aware otherwise    Patient Active Problem List   Diagnosis Date Noted  . Iron deficiency anemia due to chronic blood loss 03/10/2017  . Postoperative wound dehiscence 12/02/2016  . E. coli UTI   . Abnormal urinalysis   . Medication side effect   . Neuropathic pain   . History of DVT (deep vein thrombosis)   . Benign essential HTN   . RLS (restless legs syndrome)   . Urinary retention   . Hypokalemia   . Hypoalbuminemia due to protein-calorie malnutrition ([place])   . Acute blood loss anemia   . Lumbar stenosis with neurogenic claudication 11/18/2016  . Spondylolisthesis of lumbar region 11/12/2016  . Hereditary hemochromatosis ([place]) 07/28/2016  . Prothrombin gene mutation ([place]) 05/30/2013  . Right leg DVT ([place]) 05/30/2013  . Hemochromatosis 07/29/2012    Past Surgical History:  Procedure Laterality Date  . BACK SURGERY    . COLONOSCOPY    . SHOULDER SURGERY Bilateral      OB History   None      Home Medications    Prior to Admission medications   Medication Sig Start Date End Date Taking? Authorizing Provider  amLODipine (NORVASC) 10 MG tablet  04/09/17   [provider]  Biotin 5000 MCG TABS Take 5,000 mcg by mouth daily.     [provider]  Calcium  Carbonate-Vitamin D (CALCIUM 600+D PO) Take 1 tablet by mouth 2 (two) times daily.    [provider]  Cholecalciferol (VITAMIN D) 2000 UNITS tablet Take 2,000 Units by mouth 2 (two) times daily.    [provider]  citalopram (CELEXA) 20 MG tablet Take 20 mg by mouth at bedtime.  08/05/11   [provider]  colchicine 0.6 MG tablet Take 1 tablet (0.6 mg total) by mouth daily. Gout flare up. 12/02/16   Love, Ivan Anchors, PA-C  cyclobenzaprine (FLEXERIL) 10 MG tablet Take 0.5 tablets (5 mg total) by mouth 2 (two) times daily as needed for muscle spasms. 12/02/16   Love, Ivan Anchors, PA-C  ferrous sulfate 325 (65 FE) MG tablet Take 325 mg 2 (two) times daily with a meal by  mouth.    [provider]  fish oil-omega-3 fatty acids 1000 MG capsule Take 1 g by mouth daily.     [provider]  furosemide (LASIX) 20 MG tablet  02/26/17   [provider]  hydrochlorothiazide (HYDRODIURIL) 25 MG tablet Take 25 mg by mouth daily.    [provider]  Hypromellose (ARTIFICIAL TEARS OP) Place 1 drop into both eyes 3 (three) times daily as needed (dry eyes).    [provider]  lidocaine (LIDODERM) 5 % Place 1 patch onto the skin daily. Apply to right shoulder and left knee at 8 am and remove at 8 pm daily (has to be off for 12 hours daily) Can be purchased over the counter. 07/02/17   Domenic Moras, PA-C  Multiple Vitamin (MULTIVITAMIN WITH MINERALS) TABS tablet Take 1 tablet by mouth daily.    [provider]  naproxen (NAPROSYN) 500 MG tablet Take 1 tablet (500 mg total) by mouth 2 (two) times daily. 07/12/17   Mabe, Forbes Cellar, MD  omeprazole (PRILOSEC) 40 MG capsule TAKE 1 CAPSULE BY MOUTH ONCE DAILY FOR 30 DAYS 02/04/18   [provider]  oxyCODONE-acetaminophen (PERCOCET/ROXICET) 5-325 MG tablet  03/10/17   [provider]  pantoprazole (PROTONIX) 40 MG tablet Take 40 mg by mouth daily before supper.    [provider]  potassium chloride SA (K-DUR,KLOR-CON) 20 MEQ tablet Take 1 tablet (20 mEq total) by mouth 2 (two) times daily. 12/02/16   Love, Ivan Anchors, PA-C  pramipexole (MIRAPEX) 0.5 MG tablet Take 0.5 mg by mouth every evening.    [provider]  propranolol (INDERAL) 10 MG tablet  02/22/17   [provider]  RESTASIS 0.05 % ophthalmic emulsion Place 1 drop into both eyes 2 (two) times daily. 09/16/16   [provider]  temazepam (RESTORIL) 15 MG capsule Take 1 capsule (15 mg total) by mouth at bedtime. 12/02/16   Love, Ivan Anchors, PA-C  Turmeric Curcumin 500 MG CAPS Take 500 mg by mouth at bedtime.     [provider]    Family History Family History  Problem  Relation Age of Onset  . Congestive Heart Failure Mother   . Pancreatic cancer Father     Social History Social History   Tobacco Use  . Smoking status: Never Smoker  . Smokeless tobacco: Never Used  . Tobacco comment: never used tobacco  Substance Use Topics  . Alcohol use: No    Alcohol/week: 0.0 standard drinks  . Drug use: No     Allergies   Morphine and related; Penicillins; and Sulfa antibiotics   Review of Systems Review of Systems  Constitutional: Negative for chills and fever.  HENT:       Positive for facial and nasal pain.   Respiratory: Negative for shortness of breath.   Cardiovascular: Negative for chest pain.  Gastrointestinal: Negative for abdominal pain, nausea and vomiting.  Musculoskeletal: Positive for arthralgias (R knee) and back pain. Negative for neck pain.  Skin: Positive for wound.  Neurological: Negative for dizziness, seizures, syncope, speech difficulty, weakness, light-headedness and numbness.  All other systems reviewed and are negative.    Physical Exam Updated Vital Signs BP (!) 157/76   Pulse 66   Temp 97.8 F (36.6 C) (Oral)   Resp 18   Ht 5' 2.5" (1.588 m)   Wt 84.7 kg   SpO2 98%   BMI 33.61 kg/m   Physical Exam  Constitutional: She appears well-developed and well-nourished.  Non-toxic appearance. No distress.  HENT:  Head: Head is without raccoon's eyes and without Battle's sign.    Right Ear: No hemotympanum.  Left Ear: No hemotympanum.  Nose: Septal deviation present. No nasal septal hematoma. Right sinus exhibits no frontal sinus tenderness. Left sinus exhibits no frontal sinus tenderness.  Mouth/Throat: Oropharynx is clear and moist.    Ecchymosis noted to anterior central maxilla- tender to palpation in this area, otherwise no facial tenderness.  No palpable instability of dentition.  Lacerations without active bleeding or appreciable FBs.    Eyes: Pupils are equal, round, and reactive to light. Conjunctivae  and EOM are normal. Right eye exhibits no discharge. Left eye exhibits no discharge.  Neck: Normal range of motion. Neck supple. No spinous process tenderness present. No neck rigidity. No edema and no erythema present.  Cardiovascular: Normal rate and regular rhythm.  No murmur heard. Pulmonary/Chest: Effort normal and breath sounds normal. No respiratory distress. She has no wheezes. She has no rales.  Abdominal: Soft. She exhibits no distension. There is no tenderness.  Musculoskeletal:  Upper extremities: Patient has normal active range of motion throughout without palpable bony tenderness Back: Patient with some mild tenderness to palpation over the diffuse lumbar region without point/focal bony tenderness or overlying skin changes Lower extremities: Patient has swelling and ecchymosis noted to the right anterior knee.  She has full active range of motion of the hips, knees, ankles, and all digits.  She is tender palpation of the anterior right knee, otherwise nontender.  No palpable joint instability.  Neurological: She is alert.  Alert.  Clear speech.  CN III through XII grossly intact.  Sensation grossly intact bilateral upper and lower extremities.  5 out of 5 symmetric grip strength.  5 out of 5 strength with hip flexion/extension bilaterally. Ambulatory.   Skin: Skin is warm and dry. No rash noted.  Psychiatric: She has a normal mood and affect. Her behavior is normal.  Nursing note and vitals reviewed.    ED Treatments / Results  Labs (all labs ordered are listed, but only abnormal results are displayed) Labs Reviewed - No data to display  EKG None  Radiology Dg Lumbar Spine Complete  Result Date: 03/25/2018 CLINICAL DATA:  RIGHT knee and low back pain after falling today, history of low back surgery EXAM: LUMBAR SPINE - COMPLETE 4+ VIEW COMPARISON:  09/22/2017 FINDINGS: Osseous demineralization. Five non-rib-bearing lumbar vertebra. Prior laminectomies of L4 and L5.  BILATERAL pedicle screws and posterior bars at L4-S1, with hardware appearing intact. Levoconvex lumbar scoliosis. Multilevel disc space narrowing and endplate spur formation. Mild anterolisthesis at L4-L5 appears unchanged. Scattered facet degenerative changes. Vertebral body heights maintained without fracture or additional  subluxation. SI joints preserved. IMPRESSION: Degenerative disc and facet disease changes of the lumbar spine with levoconvex scoliosis. Postsurgical changes of L4-S1 posterior fusion. No acute abnormalities. Electronically Signed   By: Lavonia Dana M.D.   On: 03/25/2018 18:51   Ct Head Wo Contrast  Result Date: 03/25/2018 CLINICAL DATA:  Tripped and fell in the kitchen, laceration to lower lip, history of hypertension, pulmonary embolism, Bell's palsy EXAM: CT HEAD WITHOUT CONTRAST CT MAXILLOFACIAL WITHOUT CONTRAST TECHNIQUE: Multidetector CT imaging of the head and maxillofacial structures were performed using the standard protocol without intravenous contrast. Multiplanar CT image reconstructions of the maxillofacial structures were also generated. Right side of face marked with BB. COMPARISON:  CT head 04/20/2014 FINDINGS: CT HEAD FINDINGS Brain: Generalized atrophy. Normal ventricular morphology. No midline shift or mass effect. Small vessel chronic ischemic changes of deep cerebral white matter. No intracranial hemorrhage, mass lesion, evidence of acute infarction, or extra-axial fluid collection. Vascular: No hyperdense vessels. Skull: Calvaria intact. Chronic lytic lesion with well-defined border in clivus measuring 20 x 15 x 16 mm grossly unchanged. Other: N/A CT MAXILLOFACIAL FINDINGS Osseous: Chronic lytic lesion of the clivus, see above. Scattered beam hardening artifacts of dental origin. Mandible intact with normal TMJ alignment. Nasal septal deviation to the RIGHT. Facial bones intact. No facial bone fractures identified. Orbits: Bony orbits intact. Intraorbital soft tissue  planes clear bilaterally. Sinuses: Paranasal sinuses, visualized mastoid air cells, and middle ear cavities clear. Soft tissues: No significant facial soft tissue abnormalities. IMPRESSION: Generalized atrophy with small vessel chronic ischemic changes of deep cerebral white matter. No acute intracranial abnormalities. No acute facial bone abnormalities. Chronic benign-appearing lytic lesion with well-defined border within the clivus unchanged. Nasal septal deviation to the RIGHT. Electronically Signed   By: Lavonia Dana M.D.   On: 03/25/2018 18:42   Dg Knee Complete 4 Views Right  Result Date: 03/25/2018 CLINICAL DATA:  RIGHT knee pain and low back pain post fall EXAM: RIGHT KNEE - COMPLETE 4+ VIEW COMPARISON:  11/17/2013 FINDINGS: Osseous demineralization. Diffuse joint space narrowing and minor spur formation, greatest at medial compartment. No acute fracture, dislocation, or bone destruction. No knee joint effusion. Prepatellar soft tissue swelling extending infrapatellar. IMPRESSION: Osteoarthritic changes RIGHT knee greatest at medial compartment. Anterior soft tissue swelling without acute bony abnormalities. Electronically Signed   By: Lavonia Dana M.D.   On: 03/25/2018 18:49   Ct Maxillofacial Wo Cm  Result Date: 03/25/2018 CLINICAL DATA:  Tripped and fell in the kitchen, laceration to lower lip, history of hypertension, pulmonary embolism, Bell's palsy EXAM: CT HEAD WITHOUT CONTRAST CT MAXILLOFACIAL WITHOUT CONTRAST TECHNIQUE: Multidetector CT imaging of the head and maxillofacial structures were performed using the standard protocol without intravenous contrast. Multiplanar CT image reconstructions of the maxillofacial structures were also generated. Right side of face marked with BB. COMPARISON:  CT head 04/20/2014 FINDINGS: CT HEAD FINDINGS Brain: Generalized atrophy. Normal ventricular morphology. No midline shift or mass effect. Small vessel chronic ischemic changes of deep cerebral white  matter. No intracranial hemorrhage, mass lesion, evidence of acute infarction, or extra-axial fluid collection. Vascular: No hyperdense vessels. Skull: Calvaria intact. Chronic lytic lesion with well-defined border in clivus measuring 20 x 15 x 16 mm grossly unchanged. Other: N/A CT MAXILLOFACIAL FINDINGS Osseous: Chronic lytic lesion of the clivus, see above. Scattered beam hardening artifacts of dental origin. Mandible intact with normal TMJ alignment. Nasal septal deviation to the RIGHT. Facial bones intact. No facial bone fractures identified. Orbits: Bony orbits intact. Intraorbital soft  tissue planes clear bilaterally. Sinuses: Paranasal sinuses, visualized mastoid air cells, and middle ear cavities clear. Soft tissues: No significant facial soft tissue abnormalities. IMPRESSION: Generalized atrophy with small vessel chronic ischemic changes of deep cerebral white matter. No acute intracranial abnormalities. No acute facial bone abnormalities. Chronic benign-appearing lytic lesion with well-defined border within the clivus unchanged. Nasal septal deviation to the RIGHT. Electronically Signed   By: Lavonia Dana M.D.   On: 03/25/2018 18:42    Procedures .Marland KitchenLaceration Repair Date/Time: 03/25/2018 8:02 PM Performed by: Amaryllis Dyke, PA-C Authorized by: Amaryllis Dyke, PA-C   Consent:    Consent obtained:  Verbal   Consent given by:  Patient   Risks discussed:  Infection, need for additional repair, nerve damage, poor wound healing, poor cosmetic result, pain, retained foreign body, tendon damage and vascular damage   Alternatives discussed:  No treatment Anesthesia (see MAR for exact dosages):    Anesthesia method:  Topical application   Topical anesthetic:  LET Laceration details:    Location:  Lip   Lip location:  Lower exterior lip   Wound length (cm): 2.5 cm to lower lip with 1.5 cm extension to skin just below. Repair type:    Repair type:  Simple Pre-procedure details:      Preparation:  Patient was prepped and draped in usual sterile fashion and imaging obtained to evaluate for foreign bodies Exploration:    Hemostasis achieved with:  Direct pressure   Wound exploration: wound explored through full range of motion and entire depth of wound probed and visualized   Treatment:    Area cleansed with:  Saline   Amount of cleaning:  Standard   Irrigation solution:  Sterile water   Irrigation method:  Pressure wash Skin repair:    Repair method:  Sutures   Suture size:  4-0   Wound skin closure material used: vicryl.   Suture technique:  Simple interrupted   Number of sutures:  5 Approximation:    Approximation:  Close   [place] border: well-aligned   Post-procedure details:    Dressing:  Open (no dressing)   Patient tolerance of procedure:  Tolerated well, no immediate complications   (including critical care time)  Medications Ordered in ED Medications - No data to display  Initial Impression / Assessment and Plan / ED Course  I have reviewed the triage vital signs and the nursing notes.  Pertinent labs & imaging results that were available during my care of the patient were reviewed by me and considered in my medical decision making (see chart for details).   Patient presents to the emergency department status post mechanical fall with facial injury, lip laceration, and pain to the lower back and the right knee. Patient nontoxic appearing, in no apparent distress, vitals WNL other than elevated BP doubt HTN emergency. Detailed exam as above.  CT head negative for intracranial bleed. C spine/thoracic spine nontender, neck with normal ROM, L spine xray negative for acute injury without point/focal tenderness, patient ambulatory, doubt fracture/dislocation of the spine. Knee xray negative- NVI distally. CT maxillofacial with nasal septal deviation without associated fracture- no hematoma noted on exam, good air movement through the nares, this will  require ENT follow up. She does have a concussed central incisor and chipped lateral incisor- we discussed soft diet, no biting, and dental follow up.  Regarding her laceration- Pressure irrigation performed. Wound explored and base of wound visualized in a bloodless field without evidence of foreign body. Laceration repair  per procedure note above, tolerated well. Patient tetanus is not up to date- there was concern that with her prior Tetanus she had an allergic reaction involving pruritus and swelling at the site therefore last tetanus was given in multiple doses as opposed to all at once- We discussed the necessity of her tetanus being updated given her open wound (risks/benefits), we discussed option of administration w/ observation in the ER vs. PCP follow up for administration per prior protocol- patient and her daughter would prefer to wait to have PCP office do this- we again discussed the importance of the completion of this vaccine. . Discussed suture home care as well as need for wound recheck and overall re-evaluation w/ tetanus at PCP office.  I discussed results, treatment plan, need for follow-up, and return precautions with the patient including signs of infection. Provided opportunity for questions, patient and her daughter at bedside confirmed understanding and are in agreement with plan.    Findings and plan of care discussed with supervising physician Dr. Rex Kras who personally evaluated and examined this patient and is in agreement.    Final Clinical Impressions(s) / ED Diagnoses   Final diagnoses:  Fall, initial encounter  Lip laceration, initial encounter  Deviated septum  Need for tetanus booster  Concussion injury of tooth    ED Discharge Orders    None       Amaryllis Dyke, PA-C 03/25/18 2012    Little, Wenda Overland, MD 03/29/18 [phone]

## 2018-03-30 DIAGNOSIS — M79604 Pain in right leg: Secondary | ICD-10-CM | POA: Diagnosis not present

## 2018-03-30 DIAGNOSIS — W19XXXA Unspecified fall, initial encounter: Secondary | ICD-10-CM | POA: Diagnosis not present

## 2018-03-30 DIAGNOSIS — K0889 Other specified disorders of teeth and supporting structures: Secondary | ICD-10-CM | POA: Diagnosis not present

## 2018-03-30 DIAGNOSIS — S0181XA Laceration without foreign body of other part of head, initial encounter: Secondary | ICD-10-CM | POA: Diagnosis not present

## 2018-03-30 DIAGNOSIS — J342 Deviated nasal septum: Secondary | ICD-10-CM | POA: Diagnosis not present

## 2018-03-30 DIAGNOSIS — Z23 Encounter for immunization: Secondary | ICD-10-CM | POA: Diagnosis not present

## 2018-04-06 DIAGNOSIS — S0992XA Unspecified injury of nose, initial encounter: Secondary | ICD-10-CM | POA: Diagnosis not present

## 2018-04-07 DIAGNOSIS — M79671 Pain in right foot: Secondary | ICD-10-CM | POA: Diagnosis not present

## 2018-04-07 DIAGNOSIS — R05 Cough: Secondary | ICD-10-CM | POA: Diagnosis not present

## 2018-05-06 DIAGNOSIS — M21379 Foot drop, unspecified foot: Secondary | ICD-10-CM | POA: Diagnosis not present

## 2018-05-07 DIAGNOSIS — I10 Essential (primary) hypertension: Secondary | ICD-10-CM | POA: Diagnosis not present

## 2018-05-07 DIAGNOSIS — D508 Other iron deficiency anemias: Secondary | ICD-10-CM | POA: Diagnosis not present

## 2018-05-07 DIAGNOSIS — I700 Atherosclerosis of aorta: Secondary | ICD-10-CM | POA: Diagnosis not present

## 2018-05-07 DIAGNOSIS — K210 Gastro-esophageal reflux disease with esophagitis: Secondary | ICD-10-CM | POA: Diagnosis not present

## 2018-05-07 DIAGNOSIS — G250 Essential tremor: Secondary | ICD-10-CM | POA: Diagnosis not present

## 2018-05-07 DIAGNOSIS — M1A0720 Idiopathic chronic gout, left ankle and foot, without tophus (tophi): Secondary | ICD-10-CM | POA: Diagnosis not present

## 2018-05-07 DIAGNOSIS — E7800 Pure hypercholesterolemia, unspecified: Secondary | ICD-10-CM | POA: Diagnosis not present

## 2018-05-07 DIAGNOSIS — G259 Extrapyramidal and movement disorder, unspecified: Secondary | ICD-10-CM | POA: Diagnosis not present

## 2018-06-09 DIAGNOSIS — M79642 Pain in left hand: Secondary | ICD-10-CM | POA: Diagnosis not present

## 2018-06-09 DIAGNOSIS — M79641 Pain in right hand: Secondary | ICD-10-CM | POA: Diagnosis not present

## 2018-07-01 DIAGNOSIS — E669 Obesity, unspecified: Secondary | ICD-10-CM | POA: Diagnosis not present

## 2018-07-01 DIAGNOSIS — M1009 Idiopathic gout, multiple sites: Secondary | ICD-10-CM | POA: Diagnosis not present

## 2018-07-01 DIAGNOSIS — M545 Low back pain: Secondary | ICD-10-CM | POA: Diagnosis not present

## 2018-07-01 DIAGNOSIS — M21372 Foot drop, left foot: Secondary | ICD-10-CM | POA: Diagnosis not present

## 2018-07-01 DIAGNOSIS — M150 Primary generalized (osteo)arthritis: Secondary | ICD-10-CM | POA: Diagnosis not present

## 2018-07-01 DIAGNOSIS — M2550 Pain in unspecified joint: Secondary | ICD-10-CM | POA: Diagnosis not present

## 2018-07-01 DIAGNOSIS — J209 Acute bronchitis, unspecified: Secondary | ICD-10-CM | POA: Diagnosis not present

## 2018-07-01 DIAGNOSIS — R5383 Other fatigue: Secondary | ICD-10-CM | POA: Diagnosis not present

## 2018-07-01 DIAGNOSIS — Z6832 Body mass index (BMI) 32.0-32.9, adult: Secondary | ICD-10-CM | POA: Diagnosis not present

## 2018-08-03 DIAGNOSIS — M150 Primary generalized (osteo)arthritis: Secondary | ICD-10-CM | POA: Diagnosis not present

## 2018-08-03 DIAGNOSIS — M1009 Idiopathic gout, multiple sites: Secondary | ICD-10-CM | POA: Diagnosis not present

## 2018-08-03 DIAGNOSIS — Z452 Encounter for adjustment and management of vascular access device: Secondary | ICD-10-CM | POA: Diagnosis not present

## 2018-08-03 DIAGNOSIS — M1120 Other chondrocalcinosis, unspecified site: Secondary | ICD-10-CM | POA: Diagnosis not present

## 2018-09-14 ENCOUNTER — Inpatient Hospital Stay: Payer: PPO | Attending: Hematology & Oncology | Admitting: Family

## 2018-09-14 ENCOUNTER — Inpatient Hospital Stay: Payer: PPO

## 2018-11-05 ENCOUNTER — Encounter: Payer: Self-pay | Admitting: Family

## 2018-11-05 ENCOUNTER — Inpatient Hospital Stay (HOSPITAL_BASED_OUTPATIENT_CLINIC_OR_DEPARTMENT_OTHER): Payer: PPO | Admitting: Family

## 2018-11-05 ENCOUNTER — Other Ambulatory Visit: Payer: Self-pay

## 2018-11-05 ENCOUNTER — Inpatient Hospital Stay: Payer: PPO | Attending: Hematology & Oncology

## 2018-11-05 VITALS — BP 149/73 | HR 77 | Temp 97.6°F | Resp 19 | Ht 62.5 in | Wt 178.8 lb

## 2018-11-05 DIAGNOSIS — I82401 Acute embolism and thrombosis of unspecified deep veins of right lower extremity: Secondary | ICD-10-CM

## 2018-11-05 DIAGNOSIS — Z86718 Personal history of other venous thrombosis and embolism: Secondary | ICD-10-CM

## 2018-11-05 DIAGNOSIS — D500 Iron deficiency anemia secondary to blood loss (chronic): Secondary | ICD-10-CM

## 2018-11-05 DIAGNOSIS — Z7982 Long term (current) use of aspirin: Secondary | ICD-10-CM | POA: Diagnosis not present

## 2018-11-05 LAB — CMP (CANCER CENTER ONLY)
ALT: 22 U/L (ref 0–44)
AST: 32 U/L (ref 15–41)
Albumin: 4.5 g/dL (ref 3.5–5.0)
Alkaline Phosphatase: 98 U/L (ref 38–126)
Anion gap: 12 (ref 5–15)
BUN: 25 mg/dL — ABNORMAL HIGH (ref 8–23)
CO2: 30 mmol/L (ref 22–32)
Calcium: 9.5 mg/dL (ref 8.9–10.3)
Chloride: 97 mmol/L — ABNORMAL LOW (ref 98–111)
Creatinine: 1.26 mg/dL — ABNORMAL HIGH (ref 0.44–1.00)
GFR, Est AFR Am: 47 mL/min — ABNORMAL LOW (ref >60)
GFR, Estimated: 41 mL/min — ABNORMAL LOW (ref >60)
Glucose, Bld: 102 mg/dL — ABNORMAL HIGH (ref 70–99)
Potassium: 3.4 mmol/L — ABNORMAL LOW (ref 3.5–5.1)
Sodium: 139 mmol/L (ref 135–145)
Total Bilirubin: 0.6 mg/dL (ref 0.3–1.2)
Total Protein: 7.8 g/dL (ref 6.5–8.1)

## 2018-11-05 LAB — CBC WITH DIFFERENTIAL (CANCER CENTER ONLY)
Abs Immature Granulocytes: 0.02 10*3/uL (ref 0.00–0.07)
Basophils Absolute: 0 10*3/uL (ref 0.0–0.1)
Basophils Relative: 1 %
Eosinophils Absolute: 0.3 10*3/uL (ref 0.0–0.5)
Eosinophils Relative: 5 %
HCT: 43.3 % (ref 36.0–46.0)
Hemoglobin: 14.3 g/dL (ref 12.0–15.0)
Immature Granulocytes: 0 %
Lymphocytes Relative: 31 %
Lymphs Abs: 1.7 10*3/uL (ref 0.7–4.0)
MCH: 29.4 pg (ref 26.0–34.0)
MCHC: 33 g/dL (ref 30.0–36.0)
MCV: 89.1 fL (ref 80.0–100.0)
Monocytes Absolute: 0.5 10*3/uL (ref 0.1–1.0)
Monocytes Relative: 10 %
Neutro Abs: 2.8 10*3/uL (ref 1.7–7.7)
Neutrophils Relative %: 53 %
Platelet Count: 203 10*3/uL (ref 150–400)
RBC: 4.86 MIL/uL (ref 3.87–5.11)
RDW: 13.6 % (ref 11.5–15.5)
WBC Count: 5.3 10*3/uL (ref 4.0–10.5)
nRBC: 0 % (ref 0.0–0.2)

## 2018-11-05 NOTE — Progress Notes (Signed)
Hematology and Oncology Follow Up Visit  [name] [id number] April 15, 1941 78 y.o. 11/05/2018   Principle Diagnosis:  DVT of the right leg Prothrombin II gene mutation Hemachromatosis (homozygous for C282Y Mutation) Past history of right lower extremity DVT/PE Iron deficiency anemia secondary to blood loss  Current Therapy:   Aspirin 162 mg PO daily IV iron as indicated    Interim History:  [name] is here today for follow-up. She is doing well but notes some fatigue and SOB with exertion at times.  She tripped and fell moving around in the shower in their camper. Thankfully she was not injured.  She is ambulating with a rolling walker for added support.  No fever, chills, n/v, cough, rash, dizziness, chest pain, palpitations, abdominal pain or changes in bowel or bladder habits.  No swelling, tenderness, numbness or tingling in her extremities.  No lymphadenopathy noted on exam.  No bleeding, bruising or petechiae.  She has maintained a good appetite and is staying well hydrated. Her weight is stable.   ECOG Performance Status: 1 - Symptomatic but completely ambulatory  Medications:  Allergies as of 11/05/2018      Reactions   Morphine And Related Other (See Comments)   LARGER DOSES OF MORPHINE CAUSES BODY TWITCHING   Penicillins Anaphylaxis   UNKNOWN REACTION FROM CHILDHOOD Has patient had a PCN reaction causing immediate rash, facial/tongue/throat swelling, SOB or lightheadedness with hypotension: Yes Has patient had a PCN reaction causing severe rash involving mucus membranes or skin necrosis: Unknown Has patient had a PCN reaction that required hospitalization: No Has patient had a PCN reaction occurring within the last 10 years: No If all of the above answers are "NO", then may proceed with Cephalosporin use.   Sulfa Antibiotics Other (See Comments)   UNKNOWN REACTION FROM CHILDHOOD      Medication List       Accurate as of November 05, 2018  2:25 PM. If you  have any questions, ask your nurse or doctor.        amLODipine 10 MG tablet Commonly known as: NORVASC   ARTIFICIAL TEARS OP Place 1 drop into both eyes 3 (three) times daily as needed (dry eyes).   Biotin 5000 MCG Tabs Take 5,000 mcg by mouth daily.   CALCIUM 600+D PO Take 1 tablet by mouth 2 (two) times daily.   citalopram 20 MG tablet Commonly known as: CELEXA Take 20 mg by mouth at bedtime.   colchicine 0.6 MG tablet Take 1 tablet (0.6 mg total) by mouth daily. Gout flare up.   cyclobenzaprine 10 MG tablet Commonly known as: FLEXERIL Take 0.5 tablets (5 mg total) by mouth 2 (two) times daily as needed for muscle spasms.   ferrous sulfate 325 (65 FE) MG tablet Take 325 mg 2 (two) times daily with a meal by mouth.   fish oil-omega-3 fatty acids 1000 MG capsule Take 1 g by mouth daily.   furosemide 20 MG tablet Commonly known as: LASIX   hydrochlorothiazide 25 MG tablet Commonly known as: HYDRODIURIL Take 25 mg by mouth daily.   lidocaine 5 % Commonly known as: LIDODERM Place 1 patch onto the skin daily. Apply to right shoulder and left knee at 8 am and remove at 8 pm daily (has to be off for 12 hours daily) Can be purchased over the counter.   multivitamin with minerals Tabs tablet Take 1 tablet by mouth daily.   naproxen 500 MG tablet Commonly known as: NAPROSYN Take 1 tablet (500  mg total) by mouth 2 (two) times daily.   omeprazole 40 MG capsule Commonly known as: PRILOSEC TAKE 1 CAPSULE BY MOUTH ONCE DAILY FOR 30 DAYS   oxyCODONE-acetaminophen 5-325 MG tablet Commonly known as: PERCOCET/ROXICET   pantoprazole 40 MG tablet Commonly known as: PROTONIX Take 40 mg by mouth daily before supper.   potassium chloride SA 20 MEQ tablet Commonly known as: K-DUR Take 1 tablet (20 mEq total) by mouth 2 (two) times daily.   pramipexole 0.5 MG tablet Commonly known as: MIRAPEX Take 0.5 mg by mouth every evening.   propranolol 10 MG tablet Commonly known  as: INDERAL   Restasis 0.05 % ophthalmic emulsion Generic drug: cycloSPORINE Place 1 drop into both eyes 2 (two) times daily.   temazepam 15 MG capsule Commonly known as: RESTORIL Take 1 capsule (15 mg total) by mouth at bedtime.   Turmeric Curcumin 500 MG Caps Take 500 mg by mouth at bedtime.   Vitamin D 50 MCG (2000 UT) tablet Take 2,000 Units by mouth 2 (two) times daily.       Allergies:  Allergies  Allergen Reactions  . Morphine And Related Other (See Comments)    LARGER DOSES OF MORPHINE CAUSES BODY TWITCHING  . Penicillins Anaphylaxis    UNKNOWN REACTION FROM CHILDHOOD Has patient had a PCN reaction causing immediate rash, facial/tongue/throat swelling, SOB or lightheadedness with hypotension: Yes Has patient had a PCN reaction causing severe rash involving mucus membranes or skin necrosis: Unknown Has patient had a PCN reaction that required hospitalization: No Has patient had a PCN reaction occurring within the last 10 years: No If all of the above answers are "NO", then may proceed with Cephalosporin use.   . Sulfa Antibiotics Other (See Comments)    UNKNOWN REACTION FROM CHILDHOOD    Past Medical History, Surgical history, Social history, and Family History were reviewed and updated.  Review of Systems: All other 10 point review of systems is negative.   Physical Exam:  vitals were not taken for this visit.   Wt Readings from Last 3 Encounters:  03/25/18 186 lb 11.7 oz (84.7 kg)  03/15/18 186 lb 12.8 oz (84.7 kg)  11/23/17 188 lb (85.3 kg)    Ocular: Sclerae unicteric, pupils equal, round and reactive to light Ear-nose-throat: Oropharynx clear, dentition fair Lymphatic: No cervical or supraclavicular adenopathy Lungs no rales or rhonchi, good excursion bilaterally Heart regular rate and rhythm, no murmur appreciated Abd soft, nontender, positive bowel sounds, no liver or spleen tip palpated on exam, no fluid wave  MSK no focal spinal tenderness, no  joint edema Neuro: non-focal, well-oriented, appropriate affect Breasts: Deferred   Lab Results  Component Value Date   WBC 5.3 11/05/2018   HGB 14.3 11/05/2018   HCT 43.3 11/05/2018   MCV 89.1 11/05/2018   PLT 203 11/05/2018   Lab Results  Component Value Date   FERRITIN 31 03/15/2018   IRON 99 03/15/2018   TIBC 256 03/15/2018   UIBC 157 03/15/2018   IRONPCTSAT 39 03/15/2018   Lab Results  Component Value Date   RETICCTPCT 0.9 05/11/2014   RBC 4.86 11/05/2018   RETICCTABS 39.7 05/11/2014   No results found for: KPAFRELGTCHN, LAMBDASER, KAPLAMBRATIO No results found for: IGGSERUM, IGA, IGMSERUM No results found for: Ronnald Ramp, A1GS, A2GS, Violet Baldy, MSPIKE, SPEI   Chemistry      Component Value Date/Time   NA 141 03/15/2018 1100   NA 144 04/13/2017 1134   NA 142 07/28/2016 1144  K 3.4 (L) 03/15/2018 1100   K 3.3 04/13/2017 1134   K 3.6 07/28/2016 1144   CL 100 03/15/2018 1100   CL 97 (L) 04/13/2017 1134   CO2 32 03/15/2018 1100   CO2 32 04/13/2017 1134   CO2 33 (H) 07/28/2016 1144   BUN 19 03/15/2018 1100   BUN 18 04/13/2017 1134   BUN 22.6 07/28/2016 1144   CREATININE 1.14 (H) 03/15/2018 1100   CREATININE 1.0 04/13/2017 1134   CREATININE 1.0 07/28/2016 1144      Component Value Date/Time   CALCIUM 10.0 03/15/2018 1100   CALCIUM 10.1 04/13/2017 1134   CALCIUM 9.5 07/28/2016 1144   ALKPHOS 110 03/15/2018 1100   ALKPHOS 103 (H) 04/13/2017 1134   ALKPHOS 84 07/28/2016 1144   AST 28 03/15/2018 1100   AST 24 07/28/2016 1144   ALT 20 03/15/2018 1100   ALT 27 04/13/2017 1134   ALT 17 07/28/2016 1144   BILITOT 0.6 03/15/2018 1100   BILITOT 0.41 07/28/2016 1144       Impression and Plan: Ms. Starn is a very pleasant 78 yo caucasian female with a chronic DVT of the right lower extremity.  She continues to do well and will stay on 2 baby aspirin daily.  We will plan to see her back in another 6 months.  She will contact our  office with any questions or concerns. We can certainly see her sooner if needed.   Laverna Peace, NP 7/10/20202:25 PM

## 2018-11-08 ENCOUNTER — Telehealth: Payer: Self-pay | Admitting: Hematology & Oncology

## 2018-11-08 LAB — IRON AND TIBC
Iron: 79 ug/dL (ref 41–142)
Saturation Ratios: 28 % (ref 21–57)
TIBC: 285 ug/dL (ref 236–444)
UIBC: 206 ug/dL (ref 120–384)

## 2018-11-08 LAB — FERRITIN: Ferritin: 76 ng/mL (ref 11–307)

## 2018-11-08 NOTE — Telephone Encounter (Signed)
Appointments scheduled letter/calendar was mailed per 7/10 los

## 2018-11-11 DIAGNOSIS — G250 Essential tremor: Secondary | ICD-10-CM | POA: Diagnosis not present

## 2018-11-11 DIAGNOSIS — G4700 Insomnia, unspecified: Secondary | ICD-10-CM | POA: Diagnosis not present

## 2018-11-11 DIAGNOSIS — G259 Extrapyramidal and movement disorder, unspecified: Secondary | ICD-10-CM | POA: Diagnosis not present

## 2018-11-11 DIAGNOSIS — Z0000 Encounter for general adult medical examination without abnormal findings: Secondary | ICD-10-CM | POA: Diagnosis not present

## 2018-11-11 DIAGNOSIS — M1A0720 Idiopathic chronic gout, left ankle and foot, without tophus (tophi): Secondary | ICD-10-CM | POA: Diagnosis not present

## 2018-11-11 DIAGNOSIS — I10 Essential (primary) hypertension: Secondary | ICD-10-CM | POA: Diagnosis not present

## 2018-11-11 DIAGNOSIS — K210 Gastro-esophageal reflux disease with esophagitis: Secondary | ICD-10-CM | POA: Diagnosis not present

## 2018-11-11 DIAGNOSIS — M8580 Other specified disorders of bone density and structure, unspecified site: Secondary | ICD-10-CM | POA: Diagnosis not present

## 2018-11-17 ENCOUNTER — Other Ambulatory Visit: Payer: Self-pay | Admitting: Family Medicine

## 2018-11-17 DIAGNOSIS — M8580 Other specified disorders of bone density and structure, unspecified site: Secondary | ICD-10-CM

## 2018-11-17 DIAGNOSIS — Z1231 Encounter for screening mammogram for malignant neoplasm of breast: Secondary | ICD-10-CM

## 2018-11-23 DIAGNOSIS — M1711 Unilateral primary osteoarthritis, right knee: Secondary | ICD-10-CM | POA: Diagnosis not present

## 2018-11-23 DIAGNOSIS — M1712 Unilateral primary osteoarthritis, left knee: Secondary | ICD-10-CM | POA: Diagnosis not present

## 2018-11-23 DIAGNOSIS — M170 Bilateral primary osteoarthritis of knee: Secondary | ICD-10-CM | POA: Diagnosis not present

## 2018-12-07 DIAGNOSIS — M1711 Unilateral primary osteoarthritis, right knee: Secondary | ICD-10-CM | POA: Diagnosis not present

## 2018-12-14 DIAGNOSIS — M1711 Unilateral primary osteoarthritis, right knee: Secondary | ICD-10-CM | POA: Diagnosis not present

## 2018-12-22 DIAGNOSIS — M1711 Unilateral primary osteoarthritis, right knee: Secondary | ICD-10-CM | POA: Diagnosis not present

## 2019-01-10 DIAGNOSIS — K219 Gastro-esophageal reflux disease without esophagitis: Secondary | ICD-10-CM | POA: Diagnosis not present

## 2019-01-31 ENCOUNTER — Ambulatory Visit: Payer: PPO

## 2019-01-31 ENCOUNTER — Other Ambulatory Visit: Payer: PPO

## 2019-02-21 ENCOUNTER — Other Ambulatory Visit: Payer: Self-pay

## 2019-02-21 ENCOUNTER — Ambulatory Visit
Admission: RE | Admit: 2019-02-21 | Discharge: 2019-02-21 | Disposition: A | Discharge status: Home / Self Care | Payer: PPO | Source: Ambulatory Visit | Attending: Family Medicine | Admitting: Family Medicine

## 2019-02-21 DIAGNOSIS — Z780 Asymptomatic menopausal state: Secondary | ICD-10-CM | POA: Diagnosis not present

## 2019-02-21 DIAGNOSIS — Z1231 Encounter for screening mammogram for malignant neoplasm of breast: Secondary | ICD-10-CM

## 2019-02-21 DIAGNOSIS — M8580 Other specified disorders of bone density and structure, unspecified site: Secondary | ICD-10-CM

## 2019-02-21 DIAGNOSIS — M85852 Other specified disorders of bone density and structure, left thigh: Secondary | ICD-10-CM | POA: Diagnosis not present

## 2019-03-12 IMAGING — MG DIGITAL SCREENING BILATERAL MAMMOGRAM WITH TOMO AND CAD
8 series · 8 of 24 positions shown · non-contrast
Comparison: Previous exam(s).

CLINICAL DATA: Screening.

EXAM:
DIGITAL SCREENING BILATERAL MAMMOGRAM WITH TOMO AND CAD

[R CC synth-2D]
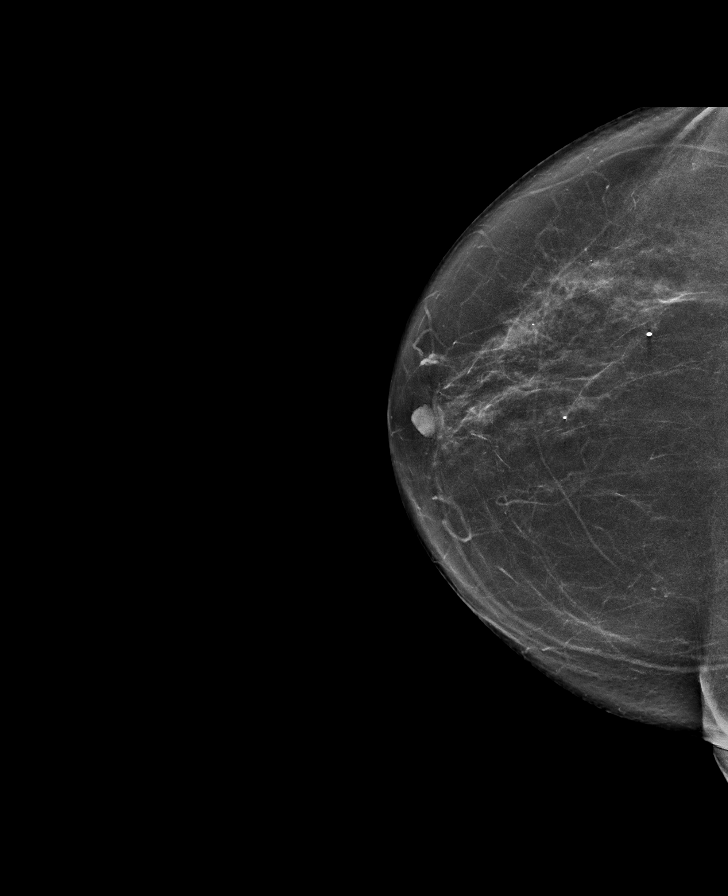

[L MLO synth-2D]
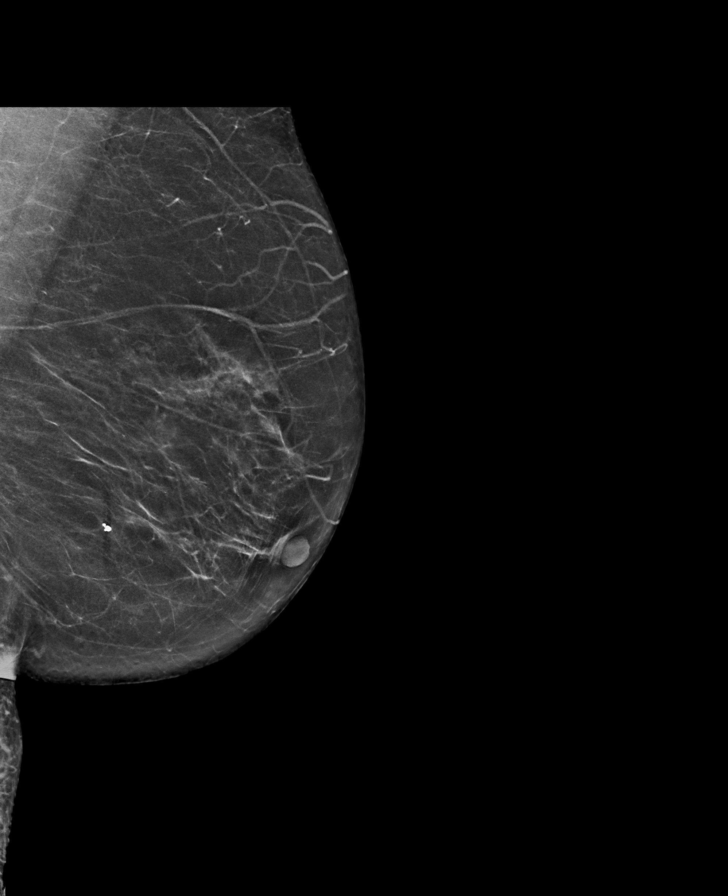

[L CC synth-2D]
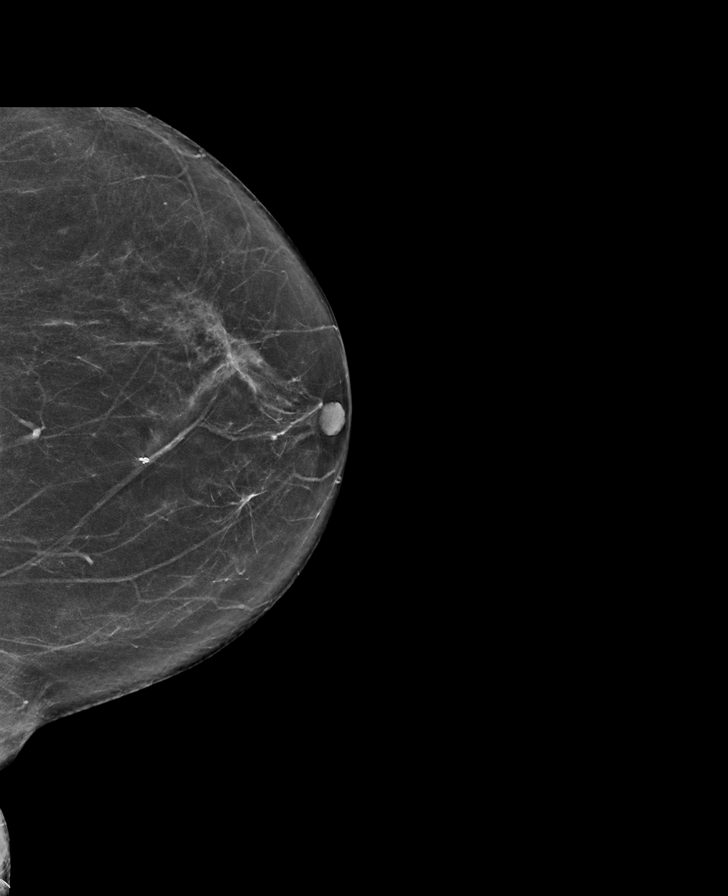

[R MLO synth-2D]
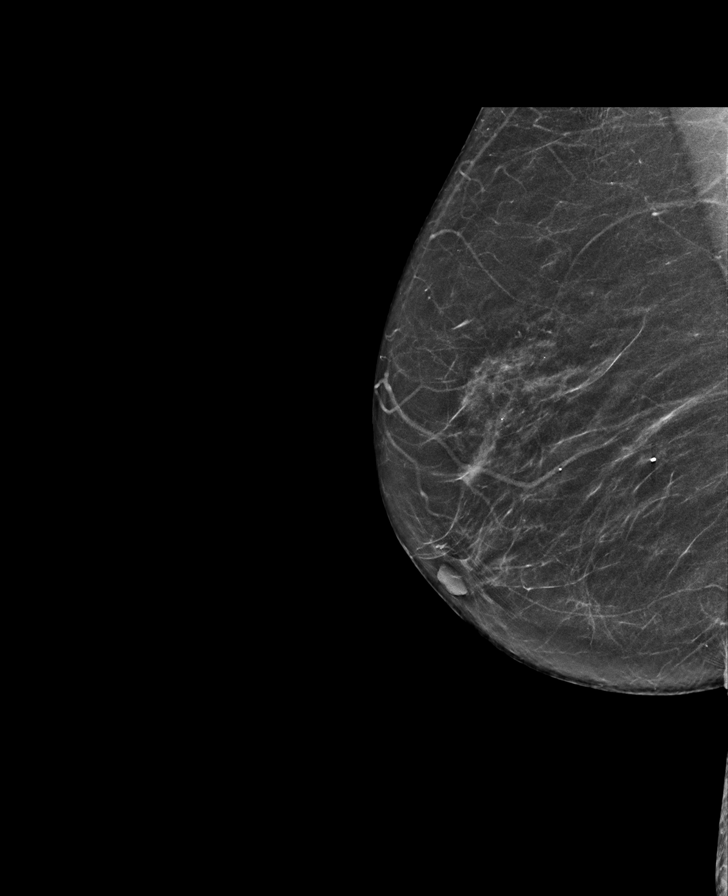

[R CC tomo · tomo slice 35/68.0]
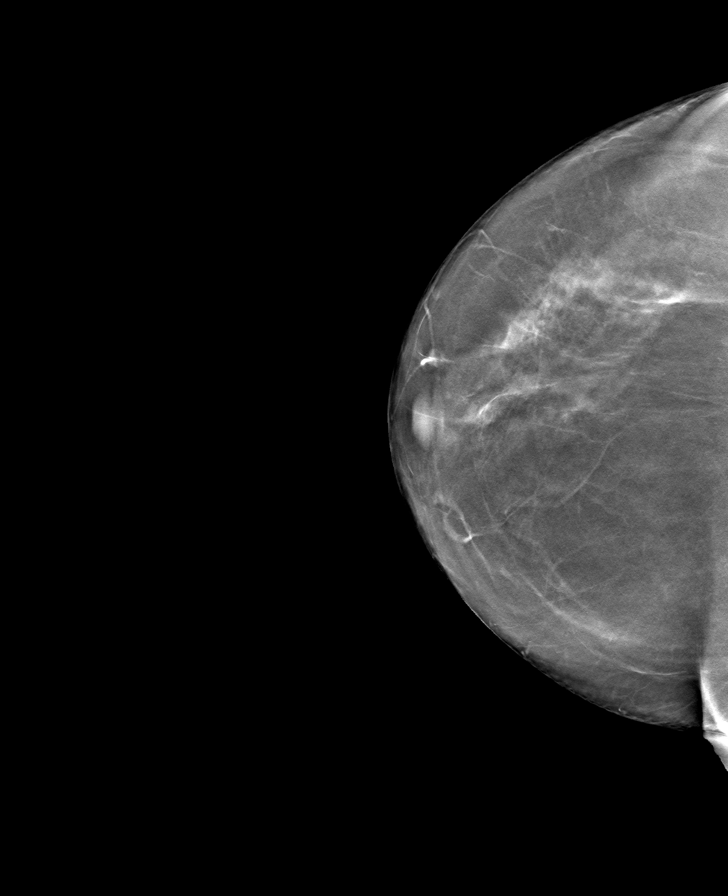

[L CC tomo · tomo slice 34/67.0]
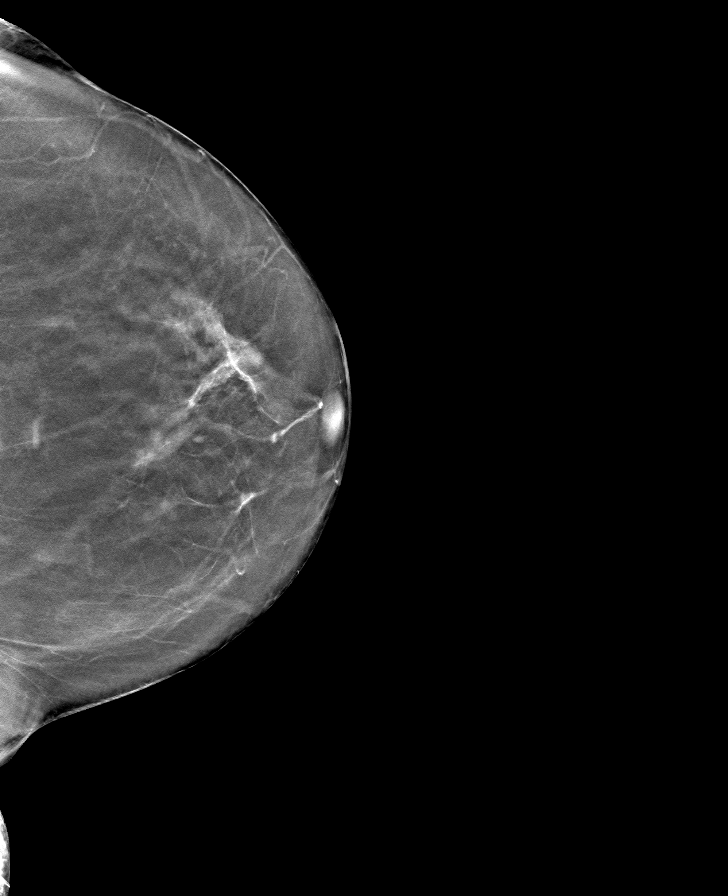

[R MLO tomo · tomo slice 33/64.0]
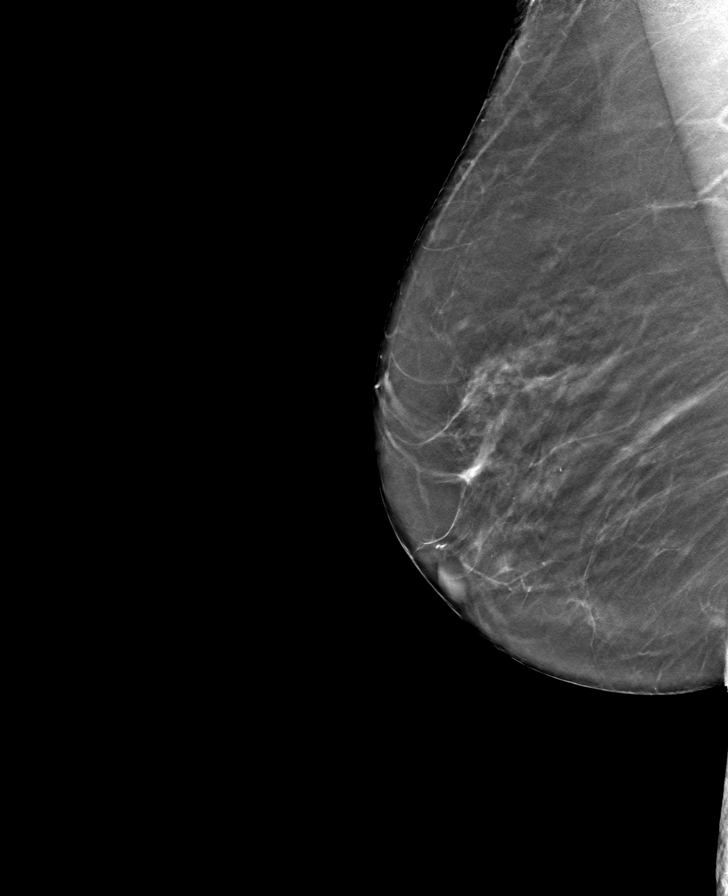

[L MLO tomo · tomo slice 32/63.0]
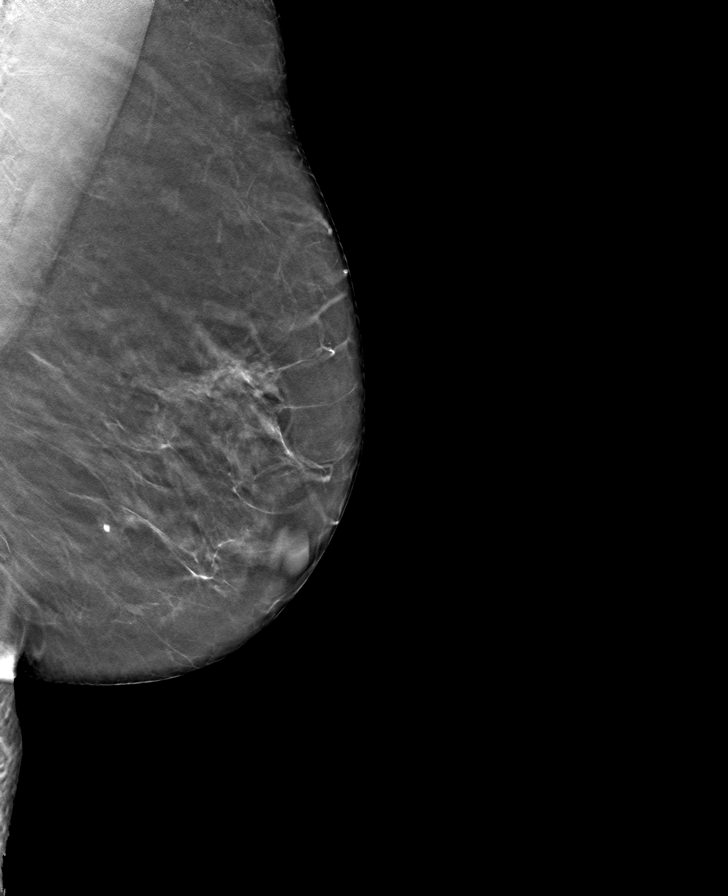

[8 of 24 positions shown; findings below may reference images not displayed]

ACR Breast Density Category b: There are scattered areas of
fibroglandular density.
FINDINGS: There are no findings suspicious for malignancy. Images were
processed with CAD.
IMPRESSION: No mammographic evidence of malignancy. A result letter of this
screening mammogram will be mailed directly to the patient.

RECOMMENDATION:
Screening mammogram in one year. (Code:[id number])

BI-RADS CATEGORY  1: Negative.

## 2019-03-21 DIAGNOSIS — M545 Low back pain: Secondary | ICD-10-CM | POA: Diagnosis not present

## 2019-03-21 DIAGNOSIS — R350 Frequency of micturition: Secondary | ICD-10-CM | POA: Diagnosis not present

## 2019-03-21 DIAGNOSIS — I10 Essential (primary) hypertension: Secondary | ICD-10-CM | POA: Diagnosis not present

## 2019-03-31 DIAGNOSIS — Z6835 Body mass index (BMI) 35.0-35.9, adult: Secondary | ICD-10-CM | POA: Diagnosis not present

## 2019-03-31 DIAGNOSIS — I10 Essential (primary) hypertension: Secondary | ICD-10-CM | POA: Diagnosis not present

## 2019-03-31 DIAGNOSIS — M545 Low back pain: Secondary | ICD-10-CM | POA: Diagnosis not present

## 2019-05-09 ENCOUNTER — Other Ambulatory Visit: Payer: Self-pay

## 2019-05-09 ENCOUNTER — Inpatient Hospital Stay (HOSPITAL_BASED_OUTPATIENT_CLINIC_OR_DEPARTMENT_OTHER): Payer: PPO | Admitting: Hematology & Oncology

## 2019-05-09 ENCOUNTER — Encounter: Payer: Self-pay | Admitting: Hematology & Oncology

## 2019-05-09 ENCOUNTER — Inpatient Hospital Stay: Payer: PPO | Attending: Hematology & Oncology

## 2019-05-09 VITALS — BP 156/83 | HR 65 | Temp 96.4°F | Resp 20 | Wt 184.0 lb

## 2019-05-09 DIAGNOSIS — I82401 Acute embolism and thrombosis of unspecified deep veins of right lower extremity: Secondary | ICD-10-CM

## 2019-05-09 DIAGNOSIS — Z86718 Personal history of other venous thrombosis and embolism: Secondary | ICD-10-CM | POA: Diagnosis not present

## 2019-05-09 DIAGNOSIS — D6852 Prothrombin gene mutation: Secondary | ICD-10-CM

## 2019-05-09 DIAGNOSIS — Z7982 Long term (current) use of aspirin: Secondary | ICD-10-CM | POA: Insufficient documentation

## 2019-05-09 DIAGNOSIS — D500 Iron deficiency anemia secondary to blood loss (chronic): Secondary | ICD-10-CM

## 2019-05-09 LAB — CBC WITH DIFFERENTIAL (CANCER CENTER ONLY)
Abs Immature Granulocytes: 0.02 10*3/uL (ref 0.00–0.07)
Basophils Absolute: 0 10*3/uL (ref 0.0–0.1)
Basophils Relative: 1 %
Eosinophils Absolute: 0.3 10*3/uL (ref 0.0–0.5)
Eosinophils Relative: 5 %
HCT: 40.2 % (ref 36.0–46.0)
Hemoglobin: 13.1 g/dL (ref 12.0–15.0)
Immature Granulocytes: 0 %
Lymphocytes Relative: 31 %
Lymphs Abs: 1.8 10*3/uL (ref 0.7–4.0)
MCH: 28.3 pg (ref 26.0–34.0)
MCHC: 32.6 g/dL (ref 30.0–36.0)
MCV: 86.8 fL (ref 80.0–100.0)
Monocytes Absolute: 0.7 10*3/uL (ref 0.1–1.0)
Monocytes Relative: 11 %
Neutro Abs: 3.1 10*3/uL (ref 1.7–7.7)
Neutrophils Relative %: 52 %
Platelet Count: 208 10*3/uL (ref 150–400)
RBC: 4.63 MIL/uL (ref 3.87–5.11)
RDW: 14.3 % (ref 11.5–15.5)
WBC Count: 5.9 10*3/uL (ref 4.0–10.5)
nRBC: 0 % (ref 0.0–0.2)

## 2019-05-09 LAB — CMP (CANCER CENTER ONLY)
ALT: 17 U/L (ref 0–44)
AST: 23 U/L (ref 15–41)
Albumin: 4.3 g/dL (ref 3.5–5.0)
Alkaline Phosphatase: 110 U/L (ref 38–126)
Anion gap: 10 (ref 5–15)
BUN: 20 mg/dL (ref 8–23)
CO2: 32 mmol/L (ref 22–32)
Calcium: 9.8 mg/dL (ref 8.9–10.3)
Chloride: 98 mmol/L (ref 98–111)
Creatinine: 1.04 mg/dL — ABNORMAL HIGH (ref 0.44–1.00)
GFR, Est AFR Am: 59 mL/min — ABNORMAL LOW (ref >60)
GFR, Estimated: 51 mL/min — ABNORMAL LOW (ref >60)
Glucose, Bld: 97 mg/dL (ref 70–99)
Potassium: 3.4 mmol/L — ABNORMAL LOW (ref 3.5–5.1)
Sodium: 140 mmol/L (ref 135–145)
Total Bilirubin: 0.4 mg/dL (ref 0.3–1.2)
Total Protein: 7.5 g/dL (ref 6.5–8.1)

## 2019-05-09 NOTE — Progress Notes (Signed)
Hematology and Oncology Follow Up Visit  [name] [id number] 07-30-1940 79 y.o. 05/09/2019   Principle Diagnosis:  DVT of the right leg Prothrombin II gene mutation Hemachromatosis (homozygous for C282Y Mutation) Past history of right lower extremity DVT/PE Iron deficiency anemia secondary to blood loss  Current Therapy:   Aspirin 162 mg PO daily IV iron as indicated    Interim History:  [name] is here today for follow-up.  She is doing okay.  We last saw her back in July.  She made it through all the holidays.  She has had no problems with respect to blood clots.  She is on the aspirin.  She is on 162 mg aspirin daily.  There is been no bleeding.  She has had no change in bowel or bladder habits.  We do have to watch for iron deficiency.  She does have the hemochromatosis visual we are also watching for.  Today, her ferritin was 55 with an iron saturation of 17%.  She is not anemic so we do not have to give her iron.  We also do not have to phlebotomize her.  Her appetite is doing okay.  She has had no nausea or vomiting.  Overall, I would say her performance status is ECOG 1.  The bad news that a granddaughter was recently diagnosed with breast cancer.   Medications:  Allergies as of 05/09/2019      Reactions   Morphine And Related Other (See Comments)   LARGER DOSES OF MORPHINE CAUSES BODY TWITCHING   Penicillins Anaphylaxis   UNKNOWN REACTION FROM CHILDHOOD Has patient had a PCN reaction causing immediate rash, facial/tongue/throat swelling, SOB or lightheadedness with hypotension: Yes Has patient had a PCN reaction causing severe rash involving mucus membranes or skin necrosis: Unknown Has patient had a PCN reaction that required hospitalization: No Has patient had a PCN reaction occurring within the last 10 years: No If all of the above answers are "NO", then may proceed with Cephalosporin use.   Sulfa Antibiotics Other (See Comments)   UNKNOWN REACTION  FROM CHILDHOOD      Medication List       Accurate as of May 09, 2019  3:40 PM. If you have any questions, ask your nurse or doctor.        amLODipine 10 MG tablet Commonly known as: NORVASC   ARTIFICIAL TEARS OP Place 1 drop into both eyes 3 (three) times daily as needed (dry eyes).   Biotin 5000 MCG Tabs Take 5,000 mcg by mouth daily.   CALCIUM 600+D PO Take 1 tablet by mouth 2 (two) times daily.   citalopram 20 MG tablet Commonly known as: CELEXA Take 20 mg by mouth at bedtime.   colchicine 0.6 MG tablet Take 1 tablet (0.6 mg total) by mouth daily. Gout flare up.   cyclobenzaprine 10 MG tablet Commonly known as: FLEXERIL Take 0.5 tablets (5 mg total) by mouth 2 (two) times daily as needed for muscle spasms.   ferrous sulfate 325 (65 FE) MG tablet Take 325 mg 2 (two) times daily with a meal by mouth.   fish oil-omega-3 fatty acids 1000 MG capsule Take 1 g by mouth daily.   furosemide 20 MG tablet Commonly known as: LASIX   hydrochlorothiazide 25 MG tablet Commonly known as: HYDRODIURIL Take 25 mg by mouth daily.   lidocaine 5 % Commonly known as: LIDODERM Place 1 patch onto the skin daily. Apply to right shoulder and left knee at 8 am and  remove at 8 pm daily (has to be off for 12 hours daily) Can be purchased over the counter.   multivitamin with minerals Tabs tablet Take 1 tablet by mouth daily.   naproxen 500 MG tablet Commonly known as: NAPROSYN Take 1 tablet (500 mg total) by mouth 2 (two) times daily.   omeprazole 40 MG capsule Commonly known as: PRILOSEC TAKE 1 CAPSULE BY MOUTH ONCE DAILY FOR 30 DAYS   oxyCODONE-acetaminophen 5-325 MG tablet Commonly known as: PERCOCET/ROXICET   pantoprazole 40 MG tablet Commonly known as: PROTONIX Take 40 mg by mouth daily before supper.   potassium chloride SA 20 MEQ tablet Commonly known as: KLOR-CON Take 1 tablet (20 mEq total) by mouth 2 (two) times daily.   pramipexole 0.5 MG tablet Commonly  known as: MIRAPEX Take 0.5 mg by mouth every evening.   propranolol 10 MG tablet Commonly known as: INDERAL   Restasis 0.05 % ophthalmic emulsion Generic drug: cycloSPORINE Place 1 drop into both eyes 2 (two) times daily.   temazepam 15 MG capsule Commonly known as: RESTORIL Take 1 capsule (15 mg total) by mouth at bedtime.   Turmeric Curcumin 500 MG Caps Take 500 mg by mouth at bedtime.   Vitamin D 50 MCG (2000 UT) tablet Take 2,000 Units by mouth 2 (two) times daily.       Allergies:  Allergies  Allergen Reactions  . Morphine And Related Other (See Comments)    LARGER DOSES OF MORPHINE CAUSES BODY TWITCHING  . Penicillins Anaphylaxis    UNKNOWN REACTION FROM CHILDHOOD Has patient had a PCN reaction causing immediate rash, facial/tongue/throat swelling, SOB or lightheadedness with hypotension: Yes Has patient had a PCN reaction causing severe rash involving mucus membranes or skin necrosis: Unknown Has patient had a PCN reaction that required hospitalization: No Has patient had a PCN reaction occurring within the last 10 years: No If all of the above answers are "NO", then may proceed with Cephalosporin use.   . Sulfa Antibiotics Other (See Comments)    UNKNOWN REACTION FROM CHILDHOOD    Past Medical History, Surgical history, Social history, and Family History were reviewed and updated.  Review of Systems: Review of Systems  Constitutional: Negative.   HENT: Negative.   Eyes: Negative.   Respiratory: Negative.   Cardiovascular: Negative.   Gastrointestinal: Negative.   Genitourinary: Negative.   Musculoskeletal: Negative.   Skin: Negative.   Neurological: Negative.   Endo/Heme/Allergies: Negative.   Psychiatric/Behavioral: Negative.      Physical Exam:  weight is 184 lb (83.5 kg). Her temporal temperature is 96.4 F (35.8 C) (abnormal). Her blood pressure is 156/83 (abnormal) and her pulse is 65. Her respiration is 20 and oxygen saturation is 94%.   Wt  Readings from Last 3 Encounters:  05/09/19 184 lb (83.5 kg)  11/05/18 178 lb 12.8 oz (81.1 kg)  03/25/18 186 lb 11.7 oz (84.7 kg)    Physical Exam Vitals reviewed.  HENT:     Head: Normocephalic and atraumatic.  Eyes:     Pupils: Pupils are equal, round, and reactive to light.  Cardiovascular:     Rate and Rhythm: Normal rate and regular rhythm.     Heart sounds: Normal heart sounds.  Pulmonary:     Effort: Pulmonary effort is normal.     Breath sounds: Normal breath sounds.  Abdominal:     General: Bowel sounds are normal.     Palpations: Abdomen is soft.  Musculoskeletal:  General: No tenderness or deformity. Normal range of motion.     Cervical back: Normal range of motion.  Lymphadenopathy:     Cervical: No cervical adenopathy.  Skin:    General: Skin is warm and dry.     Findings: No erythema or rash.  Neurological:     Mental Status: She is alert and oriented to person, place, and time.  Psychiatric:        Behavior: Behavior normal.        Thought Content: Thought content normal.        Judgment: Judgment normal.      Lab Results  Component Value Date   WBC 5.9 05/09/2019   HGB 13.1 05/09/2019   HCT 40.2 05/09/2019   MCV 86.8 05/09/2019   PLT 208 05/09/2019   Lab Results  Component Value Date   FERRITIN 76 11/05/2018   IRON 79 11/05/2018   TIBC 285 11/05/2018   UIBC 206 11/05/2018   IRONPCTSAT 28 11/05/2018   Lab Results  Component Value Date   RETICCTPCT 0.9 05/11/2014   RBC 4.63 05/09/2019   RETICCTABS 39.7 05/11/2014   No results found for: KPAFRELGTCHN, LAMBDASER, KAPLAMBRATIO No results found for: IGGSERUM, IGA, IGMSERUM No results found for: Odetta Pink, SPEI   Chemistry      Component Value Date/Time   NA 140 05/09/2019 1456   NA 144 04/13/2017 1134   NA 142 07/28/2016 1144   K 3.4 (L) 05/09/2019 1456   K 3.3 04/13/2017 1134   K 3.6 07/28/2016 1144   CL 98 05/09/2019  1456   CL 97 (L) 04/13/2017 1134   CO2 32 05/09/2019 1456   CO2 32 04/13/2017 1134   CO2 33 (H) 07/28/2016 1144   BUN 20 05/09/2019 1456   BUN 18 04/13/2017 1134   BUN 22.6 07/28/2016 1144   CREATININE 1.04 (H) 05/09/2019 1456   CREATININE 1.0 04/13/2017 1134   CREATININE 1.0 07/28/2016 1144      Component Value Date/Time   CALCIUM 9.8 05/09/2019 1456   CALCIUM 10.1 04/13/2017 1134   CALCIUM 9.5 07/28/2016 1144   ALKPHOS 110 05/09/2019 1456   ALKPHOS 103 (H) 04/13/2017 1134   ALKPHOS 84 07/28/2016 1144   AST 23 05/09/2019 1456   AST 24 07/28/2016 1144   ALT 17 05/09/2019 1456   ALT 27 04/13/2017 1134   ALT 17 07/28/2016 1144   BILITOT 0.4 05/09/2019 1456   BILITOT 0.41 07/28/2016 1144       Impression and Plan: [name] is a very pleasant 79 yo caucasian female with a chronic DVT of the right lower extremity.   She continues to do well and will stay on 2 baby aspirin daily.   No new problems with respect to the hemochromatosis or iron deficiency.  We will plan to see her back in another 6 months.   She will contact our office with any questions or concerns. We can certainly see her sooner if needed.   Volanda Napoleon, MD 1/11/20213:40 PM

## 2019-05-10 LAB — IRON AND TIBC
Iron: 44 ug/dL (ref 41–142)
Saturation Ratios: 17 % — ABNORMAL LOW (ref 21–57)
TIBC: 258 ug/dL (ref 236–444)
UIBC: 213 ug/dL (ref 120–384)

## 2019-05-10 LAB — FERRITIN: Ferritin: 55 ng/mL (ref 11–307)

## 2019-05-23 DIAGNOSIS — K219 Gastro-esophageal reflux disease without esophagitis: Secondary | ICD-10-CM | POA: Diagnosis not present

## 2019-05-23 DIAGNOSIS — M1A0720 Idiopathic chronic gout, left ankle and foot, without tophus (tophi): Secondary | ICD-10-CM | POA: Diagnosis not present

## 2019-05-23 DIAGNOSIS — I10 Essential (primary) hypertension: Secondary | ICD-10-CM | POA: Diagnosis not present

## 2019-05-23 DIAGNOSIS — N183 Chronic kidney disease, stage 3 unspecified: Secondary | ICD-10-CM | POA: Diagnosis not present

## 2019-05-23 DIAGNOSIS — G250 Essential tremor: Secondary | ICD-10-CM | POA: Diagnosis not present

## 2019-05-23 DIAGNOSIS — E7800 Pure hypercholesterolemia, unspecified: Secondary | ICD-10-CM | POA: Diagnosis not present

## 2019-05-23 DIAGNOSIS — G259 Extrapyramidal and movement disorder, unspecified: Secondary | ICD-10-CM | POA: Diagnosis not present

## 2019-05-23 DIAGNOSIS — G4700 Insomnia, unspecified: Secondary | ICD-10-CM | POA: Diagnosis not present

## 2019-06-03 ENCOUNTER — Encounter: Payer: Self-pay | Admitting: Cardiovascular Disease

## 2019-06-03 ENCOUNTER — Ambulatory Visit: Payer: PPO | Admitting: Cardiovascular Disease

## 2019-06-03 ENCOUNTER — Other Ambulatory Visit: Payer: Self-pay

## 2019-06-03 DIAGNOSIS — R600 Localized edema: Secondary | ICD-10-CM

## 2019-06-03 DIAGNOSIS — R0600 Dyspnea, unspecified: Secondary | ICD-10-CM | POA: Diagnosis not present

## 2019-06-03 DIAGNOSIS — I10 Essential (primary) hypertension: Secondary | ICD-10-CM

## 2019-06-03 DIAGNOSIS — R0609 Other forms of dyspnea: Secondary | ICD-10-CM | POA: Insufficient documentation

## 2019-06-03 DIAGNOSIS — I447 Left bundle-branch block, unspecified: Secondary | ICD-10-CM | POA: Diagnosis not present

## 2019-06-03 NOTE — Assessment & Plan Note (Signed)
Several year history of dyspnea on exertion.  No prior history of tobacco abuse.  She does have a history of pulmonary embolism approximate 10 years ago on oral anticoagulation.  I am going get a 2D echo to further evaluate.  If her 2D echo is normal I may get a VQ scan to look at whether or not she has chronic thromboembolic disease.

## 2019-06-03 NOTE — Assessment & Plan Note (Signed)
History of bilateral lower extreme edema on hydrochlorothiazide.

## 2019-06-03 NOTE — Patient Instructions (Signed)
Medication Instructions:  Your physician recommends that you continue on your current medications as directed. Please refer to the Current Medication list given to you today.  If you need a refill on your cardiac medications before your next appointment, please call your pharmacy.   Lab work: NONE  Testing/Procedures: Your physician has requested that you have an echocardiogram. Echocardiography is a painless test that uses sound waves to create images of your heart. It provides your doctor with information about the size and shape of your heart and how well your heart's chambers and valves are working. This procedure takes approximately one hour. There are no restrictions for this procedure. [address]  Follow-Up: At [facility], you and your health needs are our priority.  As part of our continuing mission to provide you with exceptional heart care, we have created designated Provider Care Teams.  These Care Teams include your primary Cardiologist (physician) and Advanced Practice Providers (APPs -  Physician Assistants and Nurse Practitioners) who all work together to provide you with the care you need, when you need it. You may see Dr. Berry or one of the following Advanced Practice Providers on your designated Care Team:    Luke Kilroy, PA-C  Callie Goodrich, PA-C  Jesse Cleaver, FNP  Your physician wants you to follow-up as needed   

## 2019-06-03 NOTE — Assessment & Plan Note (Signed)
Left bundle branch block new since last tracing in her chart.

## 2019-06-03 NOTE — Assessment & Plan Note (Signed)
History of essential potential blood pressure measured today at 145/81.  She is on amlodipine.

## 2019-06-03 NOTE — Progress Notes (Signed)
06/03/2019 [name]   July 27, 1940  PL:[id number]  Primary Physician Shirline Frees, MD Primary Cardiologist: Lorretta Harp MD [name], [place]  HPI:  [name] is a 79 y.o. moderately overweight married Caucasian female mother of 2 children, grandmother of 35 grandchildren referred by Dr. Kenton Kingfisher for evaluation of dyspnea on exertion.  She is retired from being in accounts payable in data entry.  Her risk factors include treated hypertension and mild untreated hyperlipidemia.  One of her sisters did have CABG.  There is a question that she has had a remote stroke.  She is never had a heart attack.  She denies chest pain.  She does have GERD.  She has had right lower extremity DVT 10 years ago complicated by pulmonary embolism on Xarelto.  She says over the last several years she said progressive dyspnea on exertion.   Current Meds  Medication Sig  . amLODipine (NORVASC) 10 MG tablet   . Biotin 5000 MCG TABS Take 5,000 mcg by mouth daily.   . Calcium Carbonate-Vitamin D (CALCIUM 600+D PO) Take 1 tablet by mouth 2 (two) times daily.  . Cholecalciferol (VITAMIN D) 2000 UNITS tablet Take 2,000 Units by mouth 2 (two) times daily.  . citalopram (CELEXA) 20 MG tablet Take 20 mg by mouth at bedtime.   . colchicine 0.6 MG tablet Take 1 tablet (0.6 mg total) by mouth daily. Gout flare up.  . cyclobenzaprine (FLEXERIL) 10 MG tablet Take 0.5 tablets (5 mg total) by mouth 2 (two) times daily as needed for muscle spasms.  . ferrous sulfate 325 (65 FE) MG tablet Take 325 mg 2 (two) times daily with a meal by mouth.  . fish oil-omega-3 fatty acids 1000 MG capsule Take 1 g by mouth daily.   . furosemide (LASIX) 20 MG tablet   . hydrochlorothiazide (HYDRODIURIL) 25 MG tablet Take 25 mg by mouth daily.  . Hypromellose (ARTIFICIAL TEARS OP) Place 1 drop into both eyes 3 (three) times daily as needed (dry eyes).  Marland Kitchen lidocaine (LIDODERM) 5 % Place 1 patch onto the skin daily. Apply to right  shoulder and left knee at 8 am and remove at 8 pm daily (has to be off for 12 hours daily) Can be purchased over the counter.  . Multiple Vitamin (MULTIVITAMIN WITH MINERALS) TABS tablet Take 1 tablet by mouth daily.  . naproxen (NAPROSYN) 500 MG tablet Take 1 tablet (500 mg total) by mouth 2 (two) times daily.  Marland Kitchen omeprazole (PRILOSEC) 40 MG capsule TAKE 1 CAPSULE BY MOUTH ONCE DAILY FOR 30 DAYS  . oxyCODONE-acetaminophen (PERCOCET/ROXICET) 5-325 MG tablet   . pantoprazole (PROTONIX) 40 MG tablet Take 40 mg by mouth daily before supper.  . potassium chloride SA (K-DUR,KLOR-CON) 20 MEQ tablet Take 1 tablet (20 mEq total) by mouth 2 (two) times daily.  . pramipexole (MIRAPEX) 0.5 MG tablet Take 0.5 mg by mouth every evening.  . propranolol (INDERAL) 10 MG tablet   . RESTASIS 0.05 % ophthalmic emulsion Place 1 drop into both eyes 2 (two) times daily.  . temazepam (RESTORIL) 15 MG capsule Take 1 capsule (15 mg total) by mouth at bedtime.  Marland Kitchen tiZANidine (ZANAFLEX) 4 MG tablet Take 4 mg by mouth 3 (three) times daily as needed.  . Turmeric Curcumin 500 MG CAPS Take 500 mg by mouth at bedtime.      Allergies  Allergen Reactions  . Morphine And Related Other (See Comments)    LARGER DOSES OF  MORPHINE CAUSES BODY TWITCHING  . Penicillins Anaphylaxis    UNKNOWN REACTION FROM CHILDHOOD Has patient had a PCN reaction causing immediate rash, facial/tongue/throat swelling, SOB or lightheadedness with hypotension: Yes Has patient had a PCN reaction causing severe rash involving mucus membranes or skin necrosis: Unknown Has patient had a PCN reaction that required hospitalization: No Has patient had a PCN reaction occurring within the last 10 years: No If all of the above answers are "NO", then may proceed with Cephalosporin use.   . Sulfa Antibiotics Other (See Comments)    UNKNOWN REACTION FROM CHILDHOOD    Social History   Socioeconomic History  . Marital status: Married    Spouse name: Not on  file  . Number of children: Not on file  . Years of education: Not on file  . Highest education level: Not on file  Occupational History  . Not on file  Tobacco Use  . Smoking status: Never Smoker  . Smokeless tobacco: Never Used  . Tobacco comment: never used tobacco  Substance and Sexual Activity  . Alcohol use: No    Alcohol/week: 0.0 standard drinks  . Drug use: No  . Sexual activity: Not on file  Other Topics Concern  . Not on file  Social History Narrative  . Not on file   Social Determinants of Health   Financial Resource Strain:   . Difficulty of Paying Living Expenses: Not on file  Food Insecurity:   . Worried About Charity fundraiser in the Last Year: Not on file  . Ran Out of Food in the Last Year: Not on file  Transportation Needs:   . Lack of Transportation (Medical): Not on file  . Lack of Transportation (Non-Medical): Not on file  Physical Activity:   . Days of Exercise per Week: Not on file  . Minutes of Exercise per Session: Not on file  Stress:   . Feeling of Stress : Not on file  Social Connections:   . Frequency of Communication with Friends and Family: Not on file  . Frequency of Social Gatherings with Friends and Family: Not on file  . Attends Religious Services: Not on file  . Active Member of Clubs or Organizations: Not on file  . Attends Archivist Meetings: Not on file  . Marital Status: Not on file  Intimate Partner Violence:   . Fear of Current or Ex-Partner: Not on file  . Emotionally Abused: Not on file  . Physically Abused: Not on file  . Sexually Abused: Not on file     Review of Systems: General: negative for chills, fever, night sweats or weight changes.  Cardiovascular: negative for chest pain, dyspnea on exertion, edema, orthopnea, palpitations, paroxysmal nocturnal dyspnea or shortness of breath Dermatological: negative for rash Respiratory: negative for cough or wheezing Urologic: negative for hematuria Abdominal:  negative for nausea, vomiting, diarrhea, bright red blood per rectum, melena, or hematemesis Neurologic: negative for visual changes, syncope, or dizziness All other systems reviewed and are otherwise negative except as noted above.    Blood pressure (!) 145/81, pulse 67, temperature 97.7 F (36.5 C), height 5' 2.5" (1.588 m), weight 188 lb (85.3 kg), SpO2 96 %.  General appearance: alert and no distress Neck: no adenopathy, no carotid bruit, no JVD, supple, symmetrical, trachea midline and thyroid not enlarged, symmetric, no tenderness/mass/nodules Lungs: clear to auscultation bilaterally Heart: regular rate and rhythm, S1, S2 normal, no murmur, click, rub or gallop Extremities: extremities normal, atraumatic, no cyanosis  or edema Pulses: 2+ and symmetric Skin: Skin color, texture, turgor normal. No rashes or lesions Neurologic: Alert and oriented X 3, normal strength and tone. Normal symmetric reflexes. Normal coordination and gait  EKG normal sinus rhythm at 69 with left bundle branch block.  I personally reviewed this EKG.  ASSESSMENT AND PLAN:   Dyspnea on exertion Several year history of dyspnea on exertion.  No prior history of tobacco abuse.  She does have a history of pulmonary embolism approximate 10 years ago on oral anticoagulation.  I am going get a 2D echo to further evaluate.  If her 2D echo is normal I may get a VQ scan to look at whether or not she has chronic thromboembolic disease.  Benign essential HTN History of essential potential blood pressure measured today at 145/81.  She is on amlodipine.  Bilateral lower extremity edema History of bilateral lower extreme edema on hydrochlorothiazide.  Left bundle branch block Left bundle branch block new since last tracing in her chart.      Lorretta Harp MD FACP,FACC,FAHA, [facility] 06/03/2019 4:24 PM

## 2019-06-20 ENCOUNTER — Encounter (INDEPENDENT_AMBULATORY_CARE_PROVIDER_SITE_OTHER): Payer: Self-pay

## 2019-06-20 ENCOUNTER — Ambulatory Visit (HOSPITAL_COMMUNITY): Payer: PPO | Attending: Cardiovascular Disease

## 2019-06-20 ENCOUNTER — Other Ambulatory Visit: Payer: Self-pay

## 2019-06-20 DIAGNOSIS — R0600 Dyspnea, unspecified: Secondary | ICD-10-CM | POA: Diagnosis not present

## 2019-06-20 DIAGNOSIS — R0609 Other forms of dyspnea: Secondary | ICD-10-CM

## 2019-06-24 DIAGNOSIS — M19041 Primary osteoarthritis, right hand: Secondary | ICD-10-CM | POA: Diagnosis not present

## 2019-06-24 DIAGNOSIS — F329 Major depressive disorder, single episode, unspecified: Secondary | ICD-10-CM | POA: Diagnosis not present

## 2019-06-24 DIAGNOSIS — D508 Other iron deficiency anemias: Secondary | ICD-10-CM | POA: Diagnosis not present

## 2019-06-24 DIAGNOSIS — E7800 Pure hypercholesterolemia, unspecified: Secondary | ICD-10-CM | POA: Diagnosis not present

## 2019-06-24 DIAGNOSIS — M1990 Unspecified osteoarthritis, unspecified site: Secondary | ICD-10-CM | POA: Diagnosis not present

## 2019-06-24 DIAGNOSIS — N183 Chronic kidney disease, stage 3 unspecified: Secondary | ICD-10-CM | POA: Diagnosis not present

## 2019-06-24 DIAGNOSIS — M8580 Other specified disorders of bone density and structure, unspecified site: Secondary | ICD-10-CM | POA: Diagnosis not present

## 2019-06-24 DIAGNOSIS — N159 Renal tubulo-interstitial disease, unspecified: Secondary | ICD-10-CM | POA: Diagnosis not present

## 2019-06-24 DIAGNOSIS — I10 Essential (primary) hypertension: Secondary | ICD-10-CM | POA: Diagnosis not present

## 2019-06-27 DIAGNOSIS — R0600 Dyspnea, unspecified: Secondary | ICD-10-CM

## 2019-06-27 DIAGNOSIS — I509 Heart failure, unspecified: Secondary | ICD-10-CM

## 2019-06-27 HISTORY — DX: Dyspnea, unspecified: R06.00

## 2019-06-27 HISTORY — DX: Heart failure, unspecified: I50.9

## 2019-07-01 ENCOUNTER — Other Ambulatory Visit: Payer: Self-pay

## 2019-07-01 ENCOUNTER — Encounter: Payer: Self-pay | Admitting: Cardiovascular Disease

## 2019-07-01 ENCOUNTER — Ambulatory Visit: Payer: PPO | Admitting: Cardiovascular Disease

## 2019-07-01 DIAGNOSIS — R0602 Shortness of breath: Secondary | ICD-10-CM | POA: Diagnosis not present

## 2019-07-01 NOTE — Progress Notes (Signed)
Ms. Bakken returns for follow-up of her 2D echo performed in the evaluation of progressive dyspnea.  This showed an EF of 45 to 50% with mild global hypokinesia and mild asymmetric left ventricular hypertrophy with grade 1 diastolic dysfunction.  There is no history of tobacco abuse.  She does have a history of remote pulmonary embolism.  She denies chest pain.  I am going to get a Myoview stress test to rule out an ischemic etiology.  If this is negative we will get a VQ scan to rule out chronic pulmonary thromboembolic disease.  I will see her back in 1 month for follow-up.  Lorretta Harp, M.D., [place], [facility], [place] [address] [place], [place]  [address]  [phone] 07/01/2019 8:51 AM

## 2019-07-01 NOTE — Patient Instructions (Signed)
Medication Instructions:  Your physician recommends that you continue on your current medications as directed. Please refer to the Current Medication list given to you today.  If you need a refill on your cardiac medications before your next appointment, please call your pharmacy.   Lab work: NONE  Testing/Procedures: Your physician has requested that you have a lexiscan myoview. For further information please visit [email or web address]. Please follow instruction sheet, as given.  Follow-Up: At [facility], you and your health needs are our priority.  As part of our continuing mission to provide you with exceptional heart care, we have created designated Provider Care Teams.  These Care Teams include your primary Cardiologist (physician) and Advanced Practice Providers (APPs -  Physician Assistants and Nurse Practitioners) who all work together to provide you with the care you need, when you need it. You may see Dr. Gwenlyn Found or one of the following Advanced Practice Providers on your designated Care Team:    Kerin Ransom, PA-C  [place], [place]  Coletta Memos, [place]  Your physician wants you to follow-up in: 1 month with Dr. Gwenlyn Found

## 2019-07-05 ENCOUNTER — Telehealth (HOSPITAL_COMMUNITY): Payer: Self-pay

## 2019-07-05 NOTE — Telephone Encounter (Signed)
Encounter complete. 

## 2019-07-08 ENCOUNTER — Other Ambulatory Visit: Payer: Self-pay

## 2019-07-08 ENCOUNTER — Ambulatory Visit (HOSPITAL_BASED_OUTPATIENT_CLINIC_OR_DEPARTMENT_OTHER)
Admission: RE | Admit: 2019-07-08 | Discharge: 2019-07-08 | Disposition: A | Discharge status: Home / Self Care | Payer: PPO | Source: Ambulatory Visit | Attending: Cardiology | Admitting: Cardiology

## 2019-07-08 DIAGNOSIS — N1831 Chronic kidney disease, stage 3a: Secondary | ICD-10-CM | POA: Diagnosis present

## 2019-07-08 DIAGNOSIS — I2782 Chronic pulmonary embolism: Secondary | ICD-10-CM | POA: Diagnosis present

## 2019-07-08 DIAGNOSIS — R0600 Dyspnea, unspecified: Secondary | ICD-10-CM | POA: Diagnosis not present

## 2019-07-08 DIAGNOSIS — I130 Hypertensive heart and chronic kidney disease with heart failure and stage 1 through stage 4 chronic kidney disease, or unspecified chronic kidney disease: Secondary | ICD-10-CM | POA: Diagnosis present

## 2019-07-08 DIAGNOSIS — R0602 Shortness of breath: Secondary | ICD-10-CM

## 2019-07-08 DIAGNOSIS — I2510 Atherosclerotic heart disease of native coronary artery without angina pectoris: Secondary | ICD-10-CM | POA: Diagnosis present

## 2019-07-08 DIAGNOSIS — D509 Iron deficiency anemia, unspecified: Secondary | ICD-10-CM | POA: Diagnosis present

## 2019-07-08 DIAGNOSIS — Z86718 Personal history of other venous thrombosis and embolism: Secondary | ICD-10-CM | POA: Diagnosis not present

## 2019-07-08 DIAGNOSIS — D6852 Prothrombin gene mutation: Secondary | ICD-10-CM | POA: Diagnosis present

## 2019-07-08 DIAGNOSIS — I519 Heart disease, unspecified: Secondary | ICD-10-CM | POA: Diagnosis not present

## 2019-07-08 DIAGNOSIS — I2721 Secondary pulmonary arterial hypertension: Secondary | ICD-10-CM | POA: Diagnosis present

## 2019-07-08 DIAGNOSIS — Z20822 Contact with and (suspected) exposure to covid-19: Secondary | ICD-10-CM | POA: Diagnosis present

## 2019-07-08 DIAGNOSIS — E876 Hypokalemia: Secondary | ICD-10-CM | POA: Diagnosis not present

## 2019-07-08 DIAGNOSIS — I082 Rheumatic disorders of both aortic and tricuspid valves: Secondary | ICD-10-CM | POA: Diagnosis present

## 2019-07-08 DIAGNOSIS — K219 Gastro-esophageal reflux disease without esophagitis: Secondary | ICD-10-CM | POA: Diagnosis present

## 2019-07-08 DIAGNOSIS — M19011 Primary osteoarthritis, right shoulder: Secondary | ICD-10-CM | POA: Diagnosis present

## 2019-07-08 DIAGNOSIS — I5043 Acute on chronic combined systolic (congestive) and diastolic (congestive) heart failure: Secondary | ICD-10-CM | POA: Diagnosis not present

## 2019-07-08 DIAGNOSIS — I420 Dilated cardiomyopathy: Secondary | ICD-10-CM | POA: Diagnosis present

## 2019-07-08 DIAGNOSIS — G250 Essential tremor: Secondary | ICD-10-CM | POA: Diagnosis present

## 2019-07-08 DIAGNOSIS — J9601 Acute respiratory failure with hypoxia: Secondary | ICD-10-CM | POA: Diagnosis not present

## 2019-07-08 DIAGNOSIS — I10 Essential (primary) hypertension: Secondary | ICD-10-CM | POA: Diagnosis not present

## 2019-07-08 DIAGNOSIS — M25511 Pain in right shoulder: Secondary | ICD-10-CM | POA: Diagnosis not present

## 2019-07-08 DIAGNOSIS — G2581 Restless legs syndrome: Secondary | ICD-10-CM | POA: Diagnosis not present

## 2019-07-08 DIAGNOSIS — R931 Abnormal findings on diagnostic imaging of heart and coronary circulation: Secondary | ICD-10-CM | POA: Diagnosis not present

## 2019-07-08 DIAGNOSIS — I447 Left bundle-branch block, unspecified: Secondary | ICD-10-CM | POA: Diagnosis present

## 2019-07-08 DIAGNOSIS — M25512 Pain in left shoulder: Secondary | ICD-10-CM | POA: Diagnosis not present

## 2019-07-08 DIAGNOSIS — R911 Solitary pulmonary nodule: Secondary | ICD-10-CM | POA: Diagnosis present

## 2019-07-08 DIAGNOSIS — I5023 Acute on chronic systolic (congestive) heart failure: Secondary | ICD-10-CM | POA: Diagnosis not present

## 2019-07-08 DIAGNOSIS — Q219 Congenital malformation of cardiac septum, unspecified: Secondary | ICD-10-CM | POA: Diagnosis not present

## 2019-07-08 DIAGNOSIS — M19012 Primary osteoarthritis, left shoulder: Secondary | ICD-10-CM | POA: Diagnosis present

## 2019-07-08 LAB — MYOCARDIAL PERFUSION IMAGING
LV dias vol: 192 mL (ref 46–106)
LV sys vol: 136 mL
Peak HR: 82 {beats}/min
Rest HR: 68 {beats}/min
SDS: 5
SRS: 5
SSS: 10
TID: 1.07

## 2019-07-08 MED ORDER — TECHNETIUM TC 99M TETROFOSMIN IV KIT
32.5000 | PACK | Freq: Once | INTRAVENOUS | Status: AC | PRN
  Administered 2019-07-08: 32.5 via INTRAVENOUS
  Filled 2019-07-08: qty 33

## 2019-07-08 MED ORDER — TECHNETIUM TC 99M TETROFOSMIN IV KIT
9.9000 | PACK | Freq: Once | INTRAVENOUS | Status: AC | PRN
  Administered 2019-07-08: 9.9 via INTRAVENOUS
  Filled 2019-07-08: qty 10

## 2019-07-08 MED ORDER — REGADENOSON 0.4 MG/5ML IV SOLN
0.4000 mg | Freq: Once | INTRAVENOUS | Status: AC
  Administered 2019-07-08: 0.4 mg via INTRAVENOUS

## 2019-07-09 ENCOUNTER — Encounter (HOSPITAL_BASED_OUTPATIENT_CLINIC_OR_DEPARTMENT_OTHER): Payer: Self-pay | Admitting: Emergency Medicine

## 2019-07-09 ENCOUNTER — Other Ambulatory Visit: Payer: Self-pay

## 2019-07-09 ENCOUNTER — Emergency Department (HOSPITAL_BASED_OUTPATIENT_CLINIC_OR_DEPARTMENT_OTHER): Payer: PPO

## 2019-07-09 ENCOUNTER — Inpatient Hospital Stay (HOSPITAL_BASED_OUTPATIENT_CLINIC_OR_DEPARTMENT_OTHER)
Admission: EM | Admit: 2019-07-09 | Discharge: 2019-07-13 | DRG: 286 | Disposition: A | Discharge status: Home / Self Care | Payer: PPO | Attending: Family Medicine | Admitting: Family Medicine

## 2019-07-09 DIAGNOSIS — I420 Dilated cardiomyopathy: Secondary | ICD-10-CM | POA: Diagnosis present

## 2019-07-09 DIAGNOSIS — D509 Iron deficiency anemia, unspecified: Secondary | ICD-10-CM | POA: Diagnosis present

## 2019-07-09 DIAGNOSIS — Z20822 Contact with and (suspected) exposure to covid-19: Secondary | ICD-10-CM | POA: Diagnosis present

## 2019-07-09 DIAGNOSIS — G2581 Restless legs syndrome: Secondary | ICD-10-CM | POA: Diagnosis present

## 2019-07-09 DIAGNOSIS — R0600 Dyspnea, unspecified: Secondary | ICD-10-CM | POA: Diagnosis present

## 2019-07-09 DIAGNOSIS — I5023 Acute on chronic systolic (congestive) heart failure: Secondary | ICD-10-CM | POA: Diagnosis present

## 2019-07-09 DIAGNOSIS — R911 Solitary pulmonary nodule: Secondary | ICD-10-CM | POA: Diagnosis present

## 2019-07-09 DIAGNOSIS — G250 Essential tremor: Secondary | ICD-10-CM | POA: Diagnosis present

## 2019-07-09 DIAGNOSIS — I447 Left bundle-branch block, unspecified: Secondary | ICD-10-CM | POA: Diagnosis present

## 2019-07-09 DIAGNOSIS — I509 Heart failure, unspecified: Secondary | ICD-10-CM

## 2019-07-09 DIAGNOSIS — Q219 Congenital malformation of cardiac septum, unspecified: Secondary | ICD-10-CM | POA: Diagnosis not present

## 2019-07-09 DIAGNOSIS — M25512 Pain in left shoulder: Secondary | ICD-10-CM

## 2019-07-09 DIAGNOSIS — I2510 Atherosclerotic heart disease of native coronary artery without angina pectoris: Secondary | ICD-10-CM | POA: Diagnosis present

## 2019-07-09 DIAGNOSIS — I10 Essential (primary) hypertension: Secondary | ICD-10-CM | POA: Diagnosis present

## 2019-07-09 DIAGNOSIS — I5043 Acute on chronic combined systolic (congestive) and diastolic (congestive) heart failure: Secondary | ICD-10-CM | POA: Diagnosis not present

## 2019-07-09 DIAGNOSIS — D500 Iron deficiency anemia secondary to blood loss (chronic): Secondary | ICD-10-CM | POA: Diagnosis present

## 2019-07-09 DIAGNOSIS — I2782 Chronic pulmonary embolism: Secondary | ICD-10-CM | POA: Diagnosis present

## 2019-07-09 DIAGNOSIS — M25519 Pain in unspecified shoulder: Secondary | ICD-10-CM

## 2019-07-09 DIAGNOSIS — D6852 Prothrombin gene mutation: Secondary | ICD-10-CM | POA: Diagnosis present

## 2019-07-09 DIAGNOSIS — J9691 Respiratory failure, unspecified with hypoxia: Secondary | ICD-10-CM

## 2019-07-09 DIAGNOSIS — M19011 Primary osteoarthritis, right shoulder: Secondary | ICD-10-CM | POA: Diagnosis present

## 2019-07-09 DIAGNOSIS — M25511 Pain in right shoulder: Secondary | ICD-10-CM

## 2019-07-09 DIAGNOSIS — I2721 Secondary pulmonary arterial hypertension: Secondary | ICD-10-CM | POA: Diagnosis present

## 2019-07-09 DIAGNOSIS — Z86718 Personal history of other venous thrombosis and embolism: Secondary | ICD-10-CM | POA: Diagnosis not present

## 2019-07-09 DIAGNOSIS — Z885 Allergy status to narcotic agent status: Secondary | ICD-10-CM

## 2019-07-09 DIAGNOSIS — M19012 Primary osteoarthritis, left shoulder: Secondary | ICD-10-CM | POA: Diagnosis present

## 2019-07-09 DIAGNOSIS — I082 Rheumatic disorders of both aortic and tricuspid valves: Secondary | ICD-10-CM | POA: Diagnosis present

## 2019-07-09 DIAGNOSIS — Z79899 Other long term (current) drug therapy: Secondary | ICD-10-CM

## 2019-07-09 DIAGNOSIS — Z7982 Long term (current) use of aspirin: Secondary | ICD-10-CM

## 2019-07-09 DIAGNOSIS — Z882 Allergy status to sulfonamides status: Secondary | ICD-10-CM

## 2019-07-09 DIAGNOSIS — J9601 Acute respiratory failure with hypoxia: Secondary | ICD-10-CM | POA: Diagnosis present

## 2019-07-09 DIAGNOSIS — I130 Hypertensive heart and chronic kidney disease with heart failure and stage 1 through stage 4 chronic kidney disease, or unspecified chronic kidney disease: Secondary | ICD-10-CM | POA: Diagnosis present

## 2019-07-09 DIAGNOSIS — N1831 Chronic kidney disease, stage 3a: Secondary | ICD-10-CM | POA: Diagnosis present

## 2019-07-09 DIAGNOSIS — E876 Hypokalemia: Secondary | ICD-10-CM | POA: Diagnosis present

## 2019-07-09 DIAGNOSIS — Z8249 Family history of ischemic heart disease and other diseases of the circulatory system: Secondary | ICD-10-CM

## 2019-07-09 DIAGNOSIS — R0609 Other forms of dyspnea: Secondary | ICD-10-CM

## 2019-07-09 DIAGNOSIS — K219 Gastro-esophageal reflux disease without esophagitis: Secondary | ICD-10-CM | POA: Diagnosis present

## 2019-07-09 DIAGNOSIS — Z79891 Long term (current) use of opiate analgesic: Secondary | ICD-10-CM

## 2019-07-09 DIAGNOSIS — Z880 Allergy status to penicillin: Secondary | ICD-10-CM

## 2019-07-09 DIAGNOSIS — R931 Abnormal findings on diagnostic imaging of heart and coronary circulation: Secondary | ICD-10-CM | POA: Diagnosis not present

## 2019-07-09 DIAGNOSIS — Z791 Long term (current) use of non-steroidal anti-inflammatories (NSAID): Secondary | ICD-10-CM

## 2019-07-09 DIAGNOSIS — Z8673 Personal history of transient ischemic attack (TIA), and cerebral infarction without residual deficits: Secondary | ICD-10-CM

## 2019-07-09 LAB — CBC WITH DIFFERENTIAL/PLATELET
Abs Immature Granulocytes: 0.02 10*3/uL (ref 0.00–0.07)
Basophils Absolute: 0 10*3/uL (ref 0.0–0.1)
Basophils Relative: 1 %
Eosinophils Absolute: 0.2 10*3/uL (ref 0.0–0.5)
Eosinophils Relative: 4 %
HCT: 34.6 % — ABNORMAL LOW (ref 36.0–46.0)
Hemoglobin: 11.4 g/dL — ABNORMAL LOW (ref 12.0–15.0)
Immature Granulocytes: 0 %
Lymphocytes Relative: 16 %
Lymphs Abs: 1 10*3/uL (ref 0.7–4.0)
MCH: 28.6 pg (ref 26.0–34.0)
MCHC: 32.9 g/dL (ref 30.0–36.0)
MCV: 86.9 fL (ref 80.0–100.0)
Monocytes Absolute: 0.9 10*3/uL (ref 0.1–1.0)
Monocytes Relative: 14 %
Neutro Abs: 4.3 10*3/uL (ref 1.7–7.7)
Neutrophils Relative %: 65 %
Platelets: 206 10*3/uL (ref 150–400)
RBC: 3.98 MIL/uL (ref 3.87–5.11)
RDW: 14.1 % (ref 11.5–15.5)
WBC: 6.5 10*3/uL (ref 4.0–10.5)
nRBC: 0 % (ref 0.0–0.2)

## 2019-07-09 LAB — BASIC METABOLIC PANEL
Anion gap: 12 (ref 5–15)
BUN: 21 mg/dL (ref 8–23)
CO2: 27 mmol/L (ref 22–32)
Calcium: 8.8 mg/dL — ABNORMAL LOW (ref 8.9–10.3)
Chloride: 96 mmol/L — ABNORMAL LOW (ref 98–111)
Creatinine, Ser: 0.97 mg/dL (ref 0.44–1.00)
GFR calc Af Amer: >60 mL/min (ref >60)
GFR calc non Af Amer: 56 mL/min — ABNORMAL LOW (ref >60)
Glucose, Bld: 90 mg/dL (ref 70–99)
Potassium: 3.2 mmol/L — ABNORMAL LOW (ref 3.5–5.1)
Sodium: 135 mmol/L (ref 135–145)

## 2019-07-09 LAB — RESPIRATORY PANEL BY RT PCR (FLU A&B, COVID)
Influenza A by PCR: NEGATIVE
Influenza B by PCR: NEGATIVE
SARS Coronavirus 2 by RT PCR: NEGATIVE

## 2019-07-09 LAB — TROPONIN I (HIGH SENSITIVITY)
Troponin I (High Sensitivity): 7 ng/L (ref <18)
Troponin I (High Sensitivity): 8 ng/L (ref <18)
Troponin I (High Sensitivity): 15 ng/L (ref <18)
Troponin I (High Sensitivity): 16 ng/L (ref <18)

## 2019-07-09 LAB — SARS CORONAVIRUS 2 AG (30 MIN TAT): SARS Coronavirus 2 Ag: NEGATIVE

## 2019-07-09 LAB — BRAIN NATRIURETIC PEPTIDE: B Natriuretic Peptide: 326.5 pg/mL — ABNORMAL HIGH (ref 0.0–100.0)

## 2019-07-09 MED ORDER — PROPRANOLOL HCL 10 MG PO TABS
10.0000 mg | ORAL_TABLET | Freq: Two times a day (BID) | ORAL | Status: DC
  Administered 2019-07-09: 10 mg via ORAL
  Filled 2019-07-09: qty 1

## 2019-07-09 MED ORDER — CITALOPRAM HYDROBROMIDE 20 MG PO TABS
20.0000 mg | ORAL_TABLET | Freq: Every day | ORAL | Status: DC
  Administered 2019-07-09 – 2019-07-12 (×4): 20 mg via ORAL
  Filled 2019-07-09 (×5): qty 1

## 2019-07-09 MED ORDER — PRAMIPEXOLE DIHYDROCHLORIDE 0.25 MG PO TABS
0.5000 mg | ORAL_TABLET | Freq: Every day | ORAL | Status: DC
  Administered 2019-07-09 – 2019-07-12 (×4): 0.5 mg via ORAL
  Filled 2019-07-09 (×5): qty 2

## 2019-07-09 MED ORDER — FUROSEMIDE 10 MG/ML IJ SOLN
40.0000 mg | Freq: Once | INTRAMUSCULAR | Status: AC
  Administered 2019-07-09: 40 mg via INTRAVENOUS
  Filled 2019-07-09: qty 4

## 2019-07-09 MED ORDER — CYCLOSPORINE 0.05 % OP EMUL
1.0000 [drp] | Freq: Two times a day (BID) | OPHTHALMIC | Status: DC
  Administered 2019-07-09 – 2019-07-13 (×8): 1 [drp] via OPHTHALMIC
  Filled 2019-07-09 (×9): qty 1

## 2019-07-09 MED ORDER — VITAMIN D3 25 MCG (1000 UNIT) PO TABS
2000.0000 [IU] | ORAL_TABLET | Freq: Two times a day (BID) | ORAL | Status: DC
  Administered 2019-07-09 – 2019-07-13 (×8): 2000 [IU] via ORAL
  Filled 2019-07-09 (×17): qty 2

## 2019-07-09 MED ORDER — ASPIRIN EC 81 MG PO TBEC
162.0000 mg | DELAYED_RELEASE_TABLET | Freq: Every day | ORAL | Status: DC
  Administered 2019-07-10: 162 mg via ORAL
  Filled 2019-07-09: qty 2

## 2019-07-09 MED ORDER — HYDROCHLOROTHIAZIDE 25 MG PO TABS
25.0000 mg | ORAL_TABLET | Freq: Every day | ORAL | Status: DC
  Administered 2019-07-10: 25 mg via ORAL
  Filled 2019-07-09: qty 1

## 2019-07-09 MED ORDER — ENOXAPARIN SODIUM 40 MG/0.4ML ~~LOC~~ SOLN
40.0000 mg | SUBCUTANEOUS | Status: DC
  Administered 2019-07-09 – 2019-07-12 (×4): 40 mg via SUBCUTANEOUS
  Filled 2019-07-09 (×5): qty 0.4

## 2019-07-09 MED ORDER — CALCIUM CARBONATE-VITAMIN D 600-200 MG-UNIT PO TABS: ORAL_TABLET | Freq: Two times a day (BID) | ORAL | Status: DC

## 2019-07-09 MED ORDER — METHYLPREDNISOLONE SODIUM SUCC 125 MG IJ SOLR
125.0000 mg | Freq: Once | INTRAMUSCULAR | Status: AC
  Administered 2019-07-09: 125 mg via INTRAVENOUS
  Filled 2019-07-09: qty 2

## 2019-07-09 MED ORDER — AMLODIPINE BESYLATE 10 MG PO TABS
10.0000 mg | ORAL_TABLET | Freq: Every day | ORAL | Status: DC
  Administered 2019-07-10 – 2019-07-11 (×2): 10 mg via ORAL
  Filled 2019-07-09 (×3): qty 1

## 2019-07-09 MED ORDER — CALCIUM POLYCARBOPHIL 625 MG PO TABS
625.0000 mg | ORAL_TABLET | Freq: Every day | ORAL | Status: DC
  Administered 2019-07-10 – 2019-07-13 (×4): 625 mg via ORAL
  Filled 2019-07-09 (×4): qty 1

## 2019-07-09 MED ORDER — ONDANSETRON HCL 4 MG/2ML IJ SOLN
4.0000 mg | Freq: Once | INTRAMUSCULAR | Status: AC
  Administered 2019-07-09: 4 mg via INTRAVENOUS
  Filled 2019-07-09: qty 2

## 2019-07-09 MED ORDER — FENTANYL CITRATE (PF) 100 MCG/2ML IJ SOLN
50.0000 ug | Freq: Once | INTRAMUSCULAR | Status: AC
  Administered 2019-07-09: 50 ug via INTRAVENOUS
  Filled 2019-07-09: qty 2

## 2019-07-09 MED ORDER — IPRATROPIUM-ALBUTEROL 0.5-2.5 (3) MG/3ML IN SOLN
3.0000 mL | Freq: Once | RESPIRATORY_TRACT | Status: AC
  Administered 2019-07-09: 3 mL via RESPIRATORY_TRACT
  Filled 2019-07-09: qty 3

## 2019-07-09 MED ORDER — CALCIUM CARBONATE-VITAMIN D 500-200 MG-UNIT PO TABS
1.0000 | ORAL_TABLET | Freq: Two times a day (BID) | ORAL | Status: DC
  Administered 2019-07-09 – 2019-07-13 (×8): 1 via ORAL
  Filled 2019-07-09 (×8): qty 1

## 2019-07-09 MED ORDER — PANTOPRAZOLE SODIUM 40 MG PO TBEC
40.0000 mg | DELAYED_RELEASE_TABLET | Freq: Every day | ORAL | Status: DC
  Administered 2019-07-10 – 2019-07-13 (×4): 40 mg via ORAL
  Filled 2019-07-09 (×4): qty 1

## 2019-07-09 MED ORDER — IOHEXOL 350 MG/ML SOLN
100.0000 mL | Freq: Once | INTRAVENOUS | Status: AC | PRN
  Administered 2019-07-09: 100 mL via INTRAVENOUS

## 2019-07-09 MED ORDER — POTASSIUM CHLORIDE 20 MEQ PO PACK
40.0000 meq | PACK | Freq: Once | ORAL | Status: AC
  Administered 2019-07-09: 40 meq via ORAL
  Filled 2019-07-09: qty 2

## 2019-07-09 MED ORDER — OXYCODONE-ACETAMINOPHEN 5-325 MG PO TABS
1.0000 | ORAL_TABLET | Freq: Four times a day (QID) | ORAL | Status: DC | PRN
  Administered 2019-07-11: 1 via ORAL
  Filled 2019-07-09: qty 1

## 2019-07-09 MED ORDER — TEMAZEPAM 7.5 MG PO CAPS
15.0000 mg | ORAL_CAPSULE | Freq: Every day | ORAL | Status: DC
  Administered 2019-07-09 – 2019-07-12 (×4): 15 mg via ORAL
  Filled 2019-07-09 (×4): qty 2

## 2019-07-09 NOTE — Plan of Care (Signed)

## 2019-07-09 NOTE — ED Provider Notes (Signed)
[facility] EMERGENCY DEPARTMENT Provider Note   CSN: [id number] Arrival date & time: 07/09/19  1039     History Chief Complaint  Patient presents with   Neck Pain    [name] is a 79 y.o. female.  Pt presents to the ED today with left shoulder blade pain and pain radiating into her neck.  This has been going on for about 1 week. The pain is now in her right shoulder and is worse.  She is also significantly sob.  She has been sob for about a year, but it has been much worse for the past few days.  She did see Dr. Gwenlyn Found for the sob and had a stress test yesterday which showed:  MYOCARDIAL PERFUSION IMAGING  Result Date: 07/08/2019  The left ventricular ejection fraction is severely decreased (<30%).  Nuclear stress EF: 29%.  There was no ST segment deviation noted during stress.  There is a small defect of mild severity present in the mid anteroseptal and apical septal location. The defect is non-reversible. This may be related to underlying LBBB. No ischemia noted.  This is a high risk study due to LV dysfunction.    Pt does have a hx of a DVT and PE.  She was on Xarelto, but is now just on ASA.  Pt had her first dose of the Covid vaccine on Monday, 3/8.  No f/c.  No known Covid exposures.        Past Medical History:  Diagnosis Date   Anemia    Arthritis    Bell's palsy    GERD (gastroesophageal reflux disease)    Hemochromatosis    History of hiatal hernia    Hypertension    Iron deficiency anemia due to chronic blood loss 03/10/2017   PE (pulmonary embolism)    Pneumonia    "walking" pneumonia   Prothrombin gene mutation ([place]) 05/30/2013   Restless legs    Right leg DVT ([place]) 05/30/2013   Stroke ([place])    found on a MRI, she's not aware otherwise    Patient Active Problem List   Diagnosis Date Noted   Dyspnea on exertion 06/03/2019   Bilateral lower extremity edema 06/03/2019   Left bundle branch block 06/03/2019   Iron  deficiency anemia due to chronic blood loss 03/10/2017   Postoperative wound dehiscence 12/02/2016   E. coli UTI    Abnormal urinalysis    Medication side effect    Neuropathic pain    History of DVT (deep vein thrombosis)    Benign essential HTN    RLS (restless legs syndrome)    Urinary retention    Hypokalemia    Hypoalbuminemia due to protein-calorie malnutrition (HCC)    Acute blood loss anemia    Lumbar stenosis with neurogenic claudication 11/18/2016   Spondylolisthesis of lumbar region 11/12/2016   Hereditary hemochromatosis ([place]) 07/28/2016   Prothrombin gene mutation ([place]) 05/30/2013   Right leg DVT ([place]) 05/30/2013   Hemochromatosis 07/29/2012    Past Surgical History:  Procedure Laterality Date   BACK SURGERY     COLONOSCOPY     SHOULDER SURGERY Bilateral      OB History   No obstetric history on file.     Family History  Problem Relation Age of Onset   Congestive Heart Failure Mother    Pancreatic cancer Father     Social History   Tobacco Use   Smoking status: Never Smoker   Smokeless tobacco: Never Used  Tobacco comment: never used tobacco  Substance Use Topics   Alcohol use: No    Alcohol/week: 0.0 standard drinks   Drug use: No    Home Medications Prior to Admission medications   Medication Sig Start Date End Date Taking? Authorizing Provider  amLODipine (NORVASC) 10 MG tablet  04/09/17   [provider]  Biotin 5000 MCG TABS Take 5,000 mcg by mouth daily.     [provider]  Calcium Carbonate-Vitamin D (CALCIUM 600+D PO) Take 1 tablet by mouth 2 (two) times daily.    [provider]  Cholecalciferol (VITAMIN D) 2000 UNITS tablet Take 2,000 Units by mouth 2 (two) times daily.    [provider]  citalopram (CELEXA) 20 MG tablet Take 20 mg by mouth at bedtime.  08/05/11   [provider]  colchicine 0.6 MG tablet Take 1 tablet (0.6 mg total) by mouth daily. Gout flare  up. 12/02/16   Love, Ivan Anchors, PA-C  cyclobenzaprine (FLEXERIL) 10 MG tablet Take 0.5 tablets (5 mg total) by mouth 2 (two) times daily as needed for muscle spasms. 12/02/16   Love, Ivan Anchors, PA-C  ferrous sulfate 325 (65 FE) MG tablet Take 325 mg 2 (two) times daily with a meal by mouth.    [provider]  fish oil-omega-3 fatty acids 1000 MG capsule Take 1 g by mouth daily.     [provider]  furosemide (LASIX) 20 MG tablet  02/26/17   [provider]  hydrochlorothiazide (HYDRODIURIL) 25 MG tablet Take 25 mg by mouth daily.    [provider]  Hypromellose (ARTIFICIAL TEARS OP) Place 1 drop into both eyes 3 (three) times daily as needed (dry eyes).    [provider]  lidocaine (LIDODERM) 5 % Place 1 patch onto the skin daily. Apply to right shoulder and left knee at 8 am and remove at 8 pm daily (has to be off for 12 hours daily) Can be purchased over the counter. 07/02/17   Domenic Moras, PA-C  Multiple Vitamin (MULTIVITAMIN WITH MINERALS) TABS tablet Take 1 tablet by mouth daily.    [provider]  naproxen (NAPROSYN) 500 MG tablet Take 1 tablet (500 mg total) by mouth 2 (two) times daily. 07/12/17   Mabe, Forbes Cellar, MD  omeprazole (PRILOSEC) 40 MG capsule TAKE 1 CAPSULE BY MOUTH ONCE DAILY FOR 30 DAYS 02/04/18   [provider]  oxyCODONE-acetaminophen (PERCOCET/ROXICET) 5-325 MG tablet  03/10/17   [provider]  pantoprazole (PROTONIX) 40 MG tablet Take 40 mg by mouth daily before supper.    [provider]  potassium chloride SA (K-DUR,KLOR-CON) 20 MEQ tablet Take 1 tablet (20 mEq total) by mouth 2 (two) times daily. 12/02/16   Love, Ivan Anchors, PA-C  pramipexole (MIRAPEX) 0.5 MG tablet Take 0.5 mg by mouth every evening.    [provider]  propranolol (INDERAL) 10 MG tablet  02/22/17   [provider]  RESTASIS 0.05 % ophthalmic emulsion Place 1 drop into both eyes 2 (two) times daily. 09/16/16    [provider]  temazepam (RESTORIL) 15 MG capsule Take 1 capsule (15 mg total) by mouth at bedtime. 12/02/16   Love, Ivan Anchors, PA-C  tiZANidine (ZANAFLEX) 4 MG tablet Take 4 mg by mouth 3 (three) times daily as needed. 03/21/19   [provider]  Turmeric Curcumin 500 MG CAPS Take 500 mg by mouth at bedtime.     [provider]    Allergies  Morphine and related, Penicillins, and Sulfa antibiotics  Review of Systems   Review of Systems  Respiratory: Positive for shortness of breath.   Musculoskeletal: Positive for back pain and neck pain.  All other systems reviewed and are negative.   Physical Exam Updated Vital Signs BP 136/61 (BP Location: Right Arm)    Pulse 72    Temp 98.7 F (37.1 C) (Oral)    Resp 14    Ht 5\' 2"  (1.575 m)    Wt 81.6 kg    SpO2 99%    BMI 32.92 kg/m   Physical Exam Vitals and nursing note reviewed.  Constitutional:      Appearance: Normal appearance.  HENT:     Head: Normocephalic and atraumatic.     Right Ear: External ear normal.     Left Ear: External ear normal.     Nose: Nose normal.     Mouth/Throat:     Mouth: Mucous membranes are moist.     Pharynx: Oropharynx is clear.  Eyes:     Extraocular Movements: Extraocular movements intact.     Conjunctiva/sclera: Conjunctivae normal.     Pupils: Pupils are equal, round, and reactive to light.  Cardiovascular:     Rate and Rhythm: Normal rate and regular rhythm.     Pulses: Normal pulses.     Heart sounds: Normal heart sounds.  Pulmonary:     Effort: Tachypnea present.     Breath sounds: Wheezing and rhonchi present.  Abdominal:     General: Abdomen is flat. Bowel sounds are normal.     Palpations: Abdomen is soft.  Musculoskeletal:        General: Normal range of motion.     Cervical back: Normal range of motion and neck supple.       Back:  Skin:    General: Skin is warm.     Capillary Refill: Capillary refill takes less than 2 seconds.  Neurological:      General: No focal deficit present.     Mental Status: She is alert and oriented to person, place, and time.  Psychiatric:        Mood and Affect: Mood normal.        Behavior: Behavior normal.        Thought Content: Thought content normal.        Judgment: Judgment normal.     ED Results / Procedures / Treatments   Labs (all labs ordered are listed, but only abnormal results are displayed) Labs Reviewed  BASIC METABOLIC PANEL - Abnormal; Notable for the following components:      Result Value   Potassium 3.2 (*)    Chloride 96 (*)    Calcium 8.8 (*)    GFR calc non Af Amer 56 (*)    All other components within normal limits  CBC WITH DIFFERENTIAL/PLATELET - Abnormal; Notable for the following components:   Hemoglobin 11.4 (*)    HCT 34.6 (*)    All other components within normal limits  BRAIN NATRIURETIC PEPTIDE - Abnormal; Notable for the following components:   B Natriuretic Peptide 326.5 (*)    All other components within normal limits  SARS CORONAVIRUS 2 AG (30 MIN TAT)  RESPIRATORY PANEL BY RT PCR (FLU A&B, COVID)  TROPONIN I (HIGH SENSITIVITY)  TROPONIN I (HIGH SENSITIVITY)    EKG EKG Interpretation  Date/Time:  Saturday July 09 2019 10:57:44 EST Ventricular Rate:  77 PR Interval:    QRS Duration: 159 QT Interval:  448 QTC Calculation: 508 R Axis:   -79 Text Interpretation: Sinus rhythm Left bundle branch block Confirmed by Isla Pence [phone]) on 07/09/2019 11:17:45 AM  LBBB is new from the last EKG on [facility], but it was noted that she had one at her appt with Dr. Gwenlyn Found last month.  Radiology CT Angio Chest PE W and/or Wo Contrast  Result Date: 07/09/2019 CLINICAL DATA:  Left shoulder pain for 1 week. Right shoulder began hurting last night. Short of breath on exertion. EXAM: CT ANGIOGRAPHY CHEST WITH CONTRAST TECHNIQUE: Multidetector CT imaging of the chest was performed using the standard protocol during bolus administration of intravenous contrast.  Multiplanar CT image reconstructions and MIPs were obtained to evaluate the vascular anatomy. CONTRAST:  113mL OMNIPAQUE IOHEXOL 350 MG/ML SOLN COMPARISON:  CTA, 02/21/2017 FINDINGS: Cardiovascular: There is satisfactory opacification of the pulmonary arteries to the segmental level. There is no evidence of a pulmonary embolism. Heart is normal in size and configuration. No pericardial effusion. Mild left and right coronary artery calcifications. Great vessels are normal in caliber. No aortic dissection. Aortic atherosclerosis. Mediastinum/Nodes: No neck base, axillary, mediastinal or hilar masses or enlarged lymph nodes. Moderate hiatal hernia. Esophagus is unremarkable. Normal trachea. Lungs/Pleura: Small area of ill-defined nodular opacities in the medial right middle lobe adjacent to the minor fissure, new since the prior CT. 2-3 mm nodule, posterior right lower lobe, image 61, series 5, not evident on the prior CT. Minor atelectasis at the bases. Remainder of the lungs is clear. No pleural effusion or pneumothorax. Upper Abdomen: No acute abnormality. Musculoskeletal: No fracture or acute finding. No osteoblastic or osteolytic lesions. Glenohumeral joints show significant narrowing, subchondral cystic change and sclerosis and marginal osteophytes. Review of the MIP images confirms the above findings. IMPRESSION: 1. No evidence of a pulmonary embolism. No convincing acute finding. 2. Small area of ill-defined nodular opacities in the medial right middle lobe near the minor fissure. This is likely inflammatory in etiology, and may be chronic but new since the prior exam. 3. 2-3 mm nodule, right lower lobe, not evident on the prior study. No follow-up needed if patient is low-risk. Non-contrast chest CT can be considered in 12 months if patient is high-risk. This recommendation follows the consensus statement: Guidelines for Management of Incidental Pulmonary Nodules Detected on CT Images: From the Fleischner  Society 2017; Radiology 2017; 284:228-243. 4. Bilateral glenohumeral joint arthropathic changes likely the source of this patient's shoulder pain. Aortic Atherosclerosis (ICD10-I70.0). Electronically Signed   By: Lajean Manes M.D.   On: 07/09/2019 12:49   DG Chest Port 1 View  Result Date: 07/09/2019 CLINICAL DATA:  Shortness of breath EXAM: PORTABLE CHEST 1 VIEW COMPARISON:  Chest CT is pending.  Chest x-ray 02/21/2017 FINDINGS: Cardiomegaly and lower mediastinal widening from hiatal hernia. The trachea is deviated to the right but this is likely accentuated by low volumes when compared with 2018. Lobulation of the right diaphragm. There is no edema, consolidation, effusion, or pneumothorax. IMPRESSION: 1. Cardiomegaly without pulmonary edema. 2. Hiatal hernia. Electronically Signed   By: Monte Fantasia M.D.   On: 07/09/2019 11:47   MYOCARDIAL PERFUSION IMAGING  Result Date: 07/08/2019  The left ventricular ejection fraction is severely decreased (<30%).  Nuclear stress EF: 29%.  There was no ST segment deviation noted during stress.  There is a small defect of mild severity present in the mid anteroseptal and apical septal location. The defect is non-reversible. This may be related to underlying LBBB. No ischemia noted.  This is a high risk study due to LV dysfunction.     Procedures Procedures (including critical care time)  Medications Ordered in ED Medications  fentaNYL (SUBLIMAZE) injection 50 mcg (50 mcg Intravenous Given 07/09/19 1127)  ondansetron (ZOFRAN) injection 4 mg (4 mg Intravenous Given 07/09/19 1127)  ipratropium-albuterol (DUONEB) 0.5-2.5 (3) MG/3ML nebulizer solution 3 mL (3 mLs Nebulization Given 07/09/19 1136)  iohexol (OMNIPAQUE) 350 MG/ML injection 100 mL (100 mLs Intravenous Contrast Given 07/09/19 1228)  methylPREDNISolone sodium succinate (SOLU-MEDROL) 125 mg/2 mL injection 125 mg (125 mg Intravenous Given 07/09/19 1328)  furosemide (LASIX) injection 40 mg (40 mg  Intravenous Given 07/09/19 1328)    ED Course  I have reviewed the triage vital signs and the nursing notes.  Pertinent labs & imaging results that were available during my care of the patient were reviewed by me and considered in my medical decision making (see chart for details).    MDM Rules/Calculators/A&P                      No PE on CTA.   Pt's EF was 45 to 50% in Feb.  EF on stress test 29%.  With ambulation, pt's O2 sats drop to the upper 70s on RA.  Pt placed on 2L.  Pt d/w Dr. Caryl Comes (cards) who recommends that pt [facility] to [facility] as she may need a cath.    Pt d/w Dr. Roosevelt Locks (triad) who will accept for transfer.  CRITICAL CARE Performed by: Isla Pence   Total critical care time: 30 minutes  Critical care time was exclusive of separately billable procedures and treating other patients.  Critical care was necessary to treat or prevent imminent or life-threatening deterioration.  Critical care was time spent personally by me on the following activities: development of treatment plan with patient and/or surrogate as well as nursing, discussions with consultants, evaluation of patient's response to treatment, examination of patient, obtaining history from patient or surrogate, ordering and performing treatments and interventions, ordering and review of laboratory studies, ordering and review of radiographic studies, pulse oximetry and re-evaluation of patient's condition.   Final Clinical Impression(s) / ED Diagnoses Final diagnoses:  Dyspnea on exertion  Acute respiratory failure with hypoxia (HCC)  Decreased cardiac ejection fraction    Rx / DC Orders ED Discharge Orders    None       Isla Pence, MD 07/09/19 1506

## 2019-07-09 NOTE — ED Notes (Signed)
Called [facility] and requests Cards consult

## 2019-07-09 NOTE — H&P (Addendum)
History and Physical    [name] [id number] DOB: Mar 24, 1941 DOA: 07/09/2019  PCP: Shirline Frees, MD  Patient coming from: [facility], lives at home with husband  I have personally briefly reviewed patient's old medical records in [place]  Chief Complaint: neck and shoulder pain  HPI: [name] is a 79 y.o. female with medical history significant for Hx of DVT, PE previously on Xarelto (discontinued due to financial constraint), prothrombin II gene mutation, HTN, restless leg, iron deficiency anemia, hemachromatosis and GERD who presented to [facility] for neck and shoulder blade pain.   About a week ago she woke up with a "crick in my neck" and had trouble moving her neck and left upper extremity.  She had sharp pain in her left shoulder blade as well as up her neck.  She denies any extraneous activities or trauma.  She then saw her PCP and was given Percocet which helped with the pain and muscle relaxer which she says was not helpful.  She then presented to the ED today since the pain had migrated to her right shoulder and would have sharp pain each time she moved her right upper extremities.  She has had bilateral shoulder surgery before.  She denies any chest pain.  She has been having complaints of dyspnea but that has been ongoing for years and is worse with exertion.  Denies any nausea, vomiting or abdominal pain.  Has some decreased appetite.  Denies any fever.  She denies any tobacco, alcohol or drug use.  Patient was evaluated at outpatient cardiology in February with a several year history of dyspnea on exertion. A 2D echo was obtain on 06/20/2019 and showed EF of 45-50% with mild global hypokinesia and mild asymmetric left ventricular hypertrophy with grade 1 diastolic dysfunction. A myoview stress test was obtained yesterday (3/12) to rule out ischemic etiology. It revealed severely decreased EF of <30% and small defect of mild severity present in the  mid-anteroseptal and apical septal location. Given the decline in her heart function, cardiology at [facility] was consulted by ED physician at [facility] and it was recommended that she be transferred to [facility] and be admitted by medicine team with plans of LHC.   In the ED, she was afebrile and normotensive but became hypoxic down to 88% and was placed on 2 L via nasal cannula.  CBC showed no leukocytosis. Hemoglobin of 11.4 which is down from 13.1 two months ago. Potassium of 3.2. Normal creatinine of 0.97. Troponin of 7 and then 8. BNP of 326. EKG shows LBBB which has been seen prior.  CXR showed cardiomegaly without pulmonary edema. CTA chest showed no PE but there is a new RLL 2-67mm nodule that should be followed by in 12 months if patient is high risk. There is also bilateral glenohumeral joint arthropathic changes that could be source of her shoulder pain.    She was given DuoNeb, 125 mg Solu-Medrol, 4 mg Zofran, 40 mg Lasix and 50 mcg of fentanyl in the ED.  Patient states her shoulder pain is much improved after receiving the fentanyl.   Review of Systems: Constitutional: No Weight Change, No Fever ENT/Mouth: No sore throat, No Rhinorrhea Eyes: No Eye Pain, No Vision Changes Cardiovascular: No Chest Pain, + SOB, +Dyspnea on Exertion, No Orthopnea, No Edema, No Palpitations Respiratory: No Cough, No Sputum Gastrointestinal: No Nausea, No Vomiting, No Diarrhea, No Constipation, No Pain Genitourinary: no Urinary Incontinence Musculoskeletal: + Arthralgias, + Myalgias Skin:  No Skin Lesions, No Pruritus,  Neuro: no Weakness, No Numbness, Psych: No Anxiety/Panic, No Depression,+ decrease appetite Heme/Lymph: No Bruising, No Bleeding  Past Medical History:  Diagnosis Date  . Anemia   . Arthritis   . Bell's palsy   . GERD (gastroesophageal reflux disease)   . Hemochromatosis   . History of hiatal hernia   . Hypertension   . Iron deficiency anemia due to chronic blood loss  03/10/2017  . PE (pulmonary embolism)   . Pneumonia    "walking" pneumonia  . Prothrombin gene mutation ([place]) 05/30/2013  . Restless legs   . Right leg DVT ([place]) 05/30/2013  . Stroke [facility])    found on a MRI, she's not aware otherwise    Past Surgical History:  Procedure Laterality Date  . BACK SURGERY    . COLONOSCOPY    . SHOULDER SURGERY Bilateral      reports that she has never smoked. She has never used smokeless tobacco. She reports that she does not drink alcohol or use drugs.  Allergies  Allergen Reactions  . Morphine And Related Other (See Comments)    LARGER DOSES OF MORPHINE CAUSES BODY TWITCHING  . Penicillins Anaphylaxis    UNKNOWN REACTION FROM CHILDHOOD Has patient had a PCN reaction causing immediate rash, facial/tongue/throat swelling, SOB or lightheadedness with hypotension: Yes Has patient had a PCN reaction causing severe rash involving mucus membranes or skin necrosis: Unknown Has patient had a PCN reaction that required hospitalization: No Has patient had a PCN reaction occurring within the last 10 years: No If all of the above answers are "NO", then may proceed with Cephalosporin use.   . Sulfa Antibiotics Other (See Comments)    UNKNOWN REACTION FROM CHILDHOOD    Family History  Problem Relation Age of Onset  . Congestive Heart Failure Mother   . Pancreatic cancer Father      Prior to Admission medications   Medication Sig Start Date End Date Taking? Authorizing Provider  amLODipine (NORVASC) 10 MG tablet  04/09/17   [provider]  Biotin 5000 MCG TABS Take 5,000 mcg by mouth daily.     [provider]  Calcium Carbonate-Vitamin D (CALCIUM 600+D PO) Take 1 tablet by mouth 2 (two) times daily.    [provider]  Cholecalciferol (VITAMIN D) 2000 UNITS tablet Take 2,000 Units by mouth 2 (two) times daily.    [provider]  citalopram (CELEXA) 20 MG tablet Take 20 mg by mouth at bedtime.  08/05/11   [provider]  colchicine 0.6 MG tablet Take 1 tablet (0.6 mg total) by mouth daily. Gout flare up. 12/02/16   Love, Ivan Anchors, PA-C  cyclobenzaprine (FLEXERIL) 10 MG tablet Take 0.5 tablets (5 mg total) by mouth 2 (two) times daily as needed for muscle spasms. 12/02/16   Love, Ivan Anchors, PA-C  ferrous sulfate 325 (65 FE) MG tablet Take 325 mg 2 (two) times daily with a meal by mouth.    [provider]  fish oil-omega-3 fatty acids 1000 MG capsule Take 1 g by mouth daily.     [provider]  furosemide (LASIX) 20 MG tablet  02/26/17   [provider]  hydrochlorothiazide (HYDRODIURIL) 25 MG tablet Take 25 mg by mouth daily.    [provider]  Hypromellose (ARTIFICIAL TEARS OP) Place 1 drop into both eyes 3 (three) times daily as needed (dry eyes).    [provider]  lidocaine (LIDODERM) 5 % Place  1 patch onto the skin daily. Apply to right shoulder and left knee at 8 am and remove at 8 pm daily (has to be off for 12 hours daily) Can be purchased over the counter. 07/02/17   Domenic Moras, PA-C  Multiple Vitamin (MULTIVITAMIN WITH MINERALS) TABS tablet Take 1 tablet by mouth daily.    [provider]  naproxen (NAPROSYN) 500 MG tablet Take 1 tablet (500 mg total) by mouth 2 (two) times daily. 07/12/17   Mabe, Forbes Cellar, MD  omeprazole (PRILOSEC) 40 MG capsule TAKE 1 CAPSULE BY MOUTH ONCE DAILY FOR 30 DAYS 02/04/18   [provider]  oxyCODONE-acetaminophen (PERCOCET/ROXICET) 5-325 MG tablet  03/10/17   [provider]  pantoprazole (PROTONIX) 40 MG tablet Take 40 mg by mouth daily before supper.    [provider]  potassium chloride SA (K-DUR,KLOR-CON) 20 MEQ tablet Take 1 tablet (20 mEq total) by mouth 2 (two) times daily. 12/02/16   Love, Ivan Anchors, PA-C  pramipexole (MIRAPEX) 0.5 MG tablet Take 0.5 mg by mouth every evening.    [provider]  propranolol (INDERAL) 10 MG tablet  02/22/17   [provider]    RESTASIS 0.05 % ophthalmic emulsion Place 1 drop into both eyes 2 (two) times daily. 09/16/16   [provider]  temazepam (RESTORIL) 15 MG capsule Take 1 capsule (15 mg total) by mouth at bedtime. 12/02/16   Love, Ivan Anchors, PA-C  tiZANidine (ZANAFLEX) 4 MG tablet Take 4 mg by mouth 3 (three) times daily as needed. 03/21/19   [provider]  Turmeric Curcumin 500 MG CAPS Take 500 mg by mouth at bedtime.     [provider]    Physical Exam: Vitals:   07/09/19 1630 07/09/19 1630 07/09/19 1656 07/09/19 1747  BP: (!) 144/78 (!) 144/78 140/67 136/68  Pulse: 70 69 72 72  Resp: (!) 25 15 20 18   Temp:  98.5 F (36.9 C)  98.4 F (36.9 C)  TempSrc:  Oral  Oral  SpO2: 98% 97% 99% 99%  Weight:    82.3 kg  Height:    5\' 3"  (1.6 m)    Constitutional: NAD, calm, comfortable, elderly female laying flat in bed  Vitals:   07/09/19 1630 07/09/19 1630 07/09/19 1656 07/09/19 1747  BP: (!) 144/78 (!) 144/78 140/67 136/68  Pulse: 70 69 72 72  Resp: (!) 25 15 20 18   Temp:  98.5 F (36.9 C)  98.4 F (36.9 C)  TempSrc:  Oral  Oral  SpO2: 98% 97% 99% 99%  Weight:    82.3 kg  Height:    5\' 3"  (1.6 m)   Eyes: PERRL, lids and conjunctivae normal ENMT: Mucous membranes are moist. Neck: normal, supple, no pain with palpation of the posterior cervical musculature Respiratory: clear to auscultation bilaterally, no wheezing, no crackles. Normal respiratory effort on 2L. Cardiovascular: Regular rate and rhythm, no murmurs / rubs / gallops. No extremity edema. 2+ pedal pulses. No chest wall pain with palpation.  Abdomen: no tenderness, no masses palpated.  Bowel sounds positive.  Musculoskeletal: no clubbing / cyanosis. No joint deformity upper and lower extremities. Good ROM of upper and lower extremity, no contractures. Normal muscle tone. Pain with palpation of the lower right scapula.  Skin: no rashes, lesions, ulcers. No induration Neurologic: CN 2-12 grossly intact. Sensation  intact. Strength 5/5 in all 4.  Psychiatric: Normal judgment and insight. Alert and oriented x 3. Normal mood.    Labs on Admission:  I have personally reviewed following labs and imaging studies  CBC: Recent Labs  Lab 07/09/19 1120  WBC 6.5  NEUTROABS 4.3  HGB 11.4*  HCT 34.6*  MCV 86.9  PLT 99991111   Basic Metabolic Panel: Recent Labs  Lab 07/09/19 1120  NA 135  K 3.2*  CL 96*  CO2 27  GLUCOSE 90  BUN 21  CREATININE 0.97  CALCIUM 8.8*   GFR: Estimated Creatinine Clearance: 47.8 mL/min (by C-G formula based on SCr of 0.97 mg/dL). Liver Function Tests: No results for input(s): AST, ALT, ALKPHOS, BILITOT, PROT, ALBUMIN in the last 168 hours. No results for input(s): LIPASE, AMYLASE in the last 168 hours. No results for input(s): AMMONIA in the last 168 hours. Coagulation Profile: No results for input(s): INR, PROTIME in the last 168 hours. Cardiac Enzymes: No results for input(s): CKTOTAL, CKMB, CKMBINDEX, TROPONINI in the last 168 hours. BNP (last 3 results) No results for input(s): PROBNP in the last 8760 hours. HbA1C: No results for input(s): HGBA1C in the last 72 hours. CBG: No results for input(s): GLUCAP in the last 168 hours. Lipid Profile: No results for input(s): CHOL, HDL, LDLCALC, TRIG, CHOLHDL, LDLDIRECT in the last 72 hours. Thyroid Function Tests: No results for input(s): TSH, T4TOTAL, FREET4, T3FREE, THYROIDAB in the last 72 hours. Anemia Panel: No results for input(s): VITAMINB12, FOLATE, FERRITIN, TIBC, IRON, RETICCTPCT in the last 72 hours. Urine analysis:    Component Value Date/Time   COLORURINE YELLOW 11/20/2016 1400   APPEARANCEUR CLEAR 11/20/2016 1400   LABSPEC 1.008 11/20/2016 1400   PHURINE 8.0 11/20/2016 1400   GLUCOSEU NEGATIVE 11/20/2016 1400   HGBUR NEGATIVE 11/20/2016 1400   BILIRUBINUR NEGATIVE 11/20/2016 1400   KETONESUR NEGATIVE 11/20/2016 1400   PROTEINUR NEGATIVE 11/20/2016 1400   UROBILINOGEN 0.2 11/24/2010 1201   NITRITE  NEGATIVE 11/20/2016 1400   LEUKOCYTESUR MODERATE (A) 11/20/2016 1400    Radiological Exams on Admission: CT Angio Chest PE W and/or Wo Contrast  Result Date: 07/09/2019 CLINICAL DATA:  Left shoulder pain for 1 week. Right shoulder began hurting last night. Short of breath on exertion. EXAM: CT ANGIOGRAPHY CHEST WITH CONTRAST TECHNIQUE: Multidetector CT imaging of the chest was performed using the standard protocol during bolus administration of intravenous contrast. Multiplanar CT image reconstructions and MIPs were obtained to evaluate the vascular anatomy. CONTRAST:  157mL OMNIPAQUE IOHEXOL 350 MG/ML SOLN COMPARISON:  CTA, 02/21/2017 FINDINGS: Cardiovascular: There is satisfactory opacification of the pulmonary arteries to the segmental level. There is no evidence of a pulmonary embolism. Heart is normal in size and configuration. No pericardial effusion. Mild left and right coronary artery calcifications. Great vessels are normal in caliber. No aortic dissection. Aortic atherosclerosis. Mediastinum/Nodes: No neck base, axillary, mediastinal or hilar masses or enlarged lymph nodes. Moderate hiatal hernia. Esophagus is unremarkable. Normal trachea. Lungs/Pleura: Small area of ill-defined nodular opacities in the medial right middle lobe adjacent to the minor fissure, new since the prior CT. 2-3 mm nodule, posterior right lower lobe, image 61, series 5, not evident on the prior CT. Minor atelectasis at the bases. Remainder of the lungs is clear. No pleural effusion or pneumothorax. Upper Abdomen: No acute abnormality. Musculoskeletal: No fracture or acute finding. No osteoblastic or osteolytic lesions. Glenohumeral joints show significant narrowing, subchondral cystic change and sclerosis and marginal osteophytes. Review of the MIP images confirms the above findings. IMPRESSION: 1. No evidence of a pulmonary embolism. No convincing acute finding. 2. Small area of ill-defined nodular opacities in the medial  right middle lobe near the minor fissure. This is likely inflammatory in etiology, and may be chronic but new since the prior exam. 3. 2-3 mm nodule, right lower lobe, not evident on the prior study. No follow-up needed if patient is low-risk. Non-contrast chest CT can be considered in 12 months if patient is high-risk. This recommendation follows the consensus statement: Guidelines for Management of Incidental Pulmonary Nodules Detected on CT Images: From the Fleischner Society 2017; Radiology 2017; 284:228-243. 4. Bilateral glenohumeral joint arthropathic changes likely the source of this patient's shoulder pain. Aortic Atherosclerosis (ICD10-I70.0). Electronically Signed   By: Lajean Manes M.D.   On: 07/09/2019 12:49   DG Chest Port 1 View  Result Date: 07/09/2019 CLINICAL DATA:  Shortness of breath EXAM: PORTABLE CHEST 1 VIEW COMPARISON:  Chest CT is pending.  Chest x-ray 02/21/2017 FINDINGS: Cardiomegaly and lower mediastinal widening from hiatal hernia. The trachea is deviated to the right but this is likely accentuated by low volumes when compared with 2018. Lobulation of the right diaphragm. There is no edema, consolidation, effusion, or pneumothorax. IMPRESSION: 1. Cardiomegaly without pulmonary edema. 2. Hiatal hernia. Electronically Signed   By: Monte Fantasia M.D.   On: 07/09/2019 11:47   MYOCARDIAL PERFUSION IMAGING  Result Date: 07/08/2019  The left ventricular ejection fraction is severely decreased (<30%).  Nuclear stress EF: 29%.  There was no ST segment deviation noted during stress.  There is a small defect of mild severity present in the mid anteroseptal and apical septal location. The defect is non-reversible. This may be related to underlying LBBB. No ischemia noted.  This is a high risk study due to LV dysfunction.     EKG: Independently reviewed.   Assessment/Plan  Acute hypoxic respiratory failure in the setting of acute sysolic heart failure on 2L via [place]  CTA Chest  negative for PE  CXR does not show any pulmonary edema. She received 40mg  Lasix in the ED but does not feel she needs diuretics at this time formal cardiology consult in the morning with potential plan for [facility]- procedure likely will happen Monday but will keep NPO after midnight while awaiting further cardiology recommendation Spoke with cardiology fellow tonight regarding need for heparin gtt- he recommends monitoring for any elevation of troponins or changes in symptoms with low threshold to start it then.  Continue to trend troponin  Repeak EKG if any changes or worsening of symptoms  Bilateral shoulder pain suspect more MSK and arthritic in nature will get PT evaluation  K pad  PRN oxycodone for pain  Hypokalemia K of 3.2 Replete  Incidental finding of RLL lung nodule Pt has no hx of tobacco use. Low risk and does not warrant further follow up  Iron deficiency anemia stable.  Receives IV iron with hematology outpatient  Hx of PE/DVT negative CTA chest for PE continue on aspirin 162 mg (stopped Xarelto due to financial constraints in the past)  Hypertension Continue amlodipine, HCTZ and propranolol  Restless leg  Continue Restoril  DVT prophylaxis:.Lovenox Code Status: Full Family Communication: Plan discussed with patient at bedside  disposition Plan: Home with at least 2 midnight stays  Consults called:  Admission status: inpatient with at least 2 midnight stay with close telemetry monitoring and cardiology consult with potential left heart cath  Orene Desanctis DO [facility]   If 7PM-7AM, please contact night-coverage [email or web address]   07/09/2019, 7:25 PM

## 2019-07-09 NOTE — ED Notes (Signed)
Spoke with Marden Noble at [facility] consult with hospitalist at [facility] for admission

## 2019-07-09 NOTE — Progress Notes (Addendum)
Patient has arrived on the unit via [facility], alert and oriented x 4. CCMD notified and tele monitor has been applied on the patient. Patient has no complaints of chest pain however has minor chronic shoulder pain. Call bell is within reach. MD paged and notified, awaiting orders.

## 2019-07-09 NOTE — Progress Notes (Signed)
79 y/o with Hx of systolic CHF, with fast deteriorating EF from about 40% to 30% in one month came to [facility] for worsening of SOB. PE study negative. Cardiology Dr. Caryl Comes propose pt admitted to [facility] for Cath. Will admit to Tele.

## 2019-07-09 NOTE — ED Triage Notes (Addendum)
Neck and L shoulder pain x 1 week. States R shoulder started hurting last night. She had a cardiac stress test done yesterday. Pt seems SOB on exertion

## 2019-07-10 DIAGNOSIS — R931 Abnormal findings on diagnostic imaging of heart and coronary circulation: Secondary | ICD-10-CM

## 2019-07-10 DIAGNOSIS — J9601 Acute respiratory failure with hypoxia: Secondary | ICD-10-CM

## 2019-07-10 DIAGNOSIS — R0600 Dyspnea, unspecified: Secondary | ICD-10-CM

## 2019-07-10 LAB — CBC
HCT: 34.2 % — ABNORMAL LOW (ref 36.0–46.0)
Hemoglobin: 11.2 g/dL — ABNORMAL LOW (ref 12.0–15.0)
MCH: 27.8 pg (ref 26.0–34.0)
MCHC: 32.7 g/dL (ref 30.0–36.0)
MCV: 84.9 fL (ref 80.0–100.0)
Platelets: 196 10*3/uL (ref 150–400)
RBC: 4.03 MIL/uL (ref 3.87–5.11)
RDW: 13.8 % (ref 11.5–15.5)
WBC: 4.9 10*3/uL (ref 4.0–10.5)
nRBC: 0 % (ref 0.0–0.2)

## 2019-07-10 LAB — BASIC METABOLIC PANEL
Anion gap: 12 (ref 5–15)
BUN: 24 mg/dL — ABNORMAL HIGH (ref 8–23)
CO2: 26 mmol/L (ref 22–32)
Calcium: 8.9 mg/dL (ref 8.9–10.3)
Chloride: 98 mmol/L (ref 98–111)
Creatinine, Ser: 1.06 mg/dL — ABNORMAL HIGH (ref 0.44–1.00)
GFR calc Af Amer: 58 mL/min — ABNORMAL LOW (ref >60)
GFR calc non Af Amer: 50 mL/min — ABNORMAL LOW (ref >60)
Glucose, Bld: 196 mg/dL — ABNORMAL HIGH (ref 70–99)
Potassium: 3.4 mmol/L — ABNORMAL LOW (ref 3.5–5.1)
Sodium: 136 mmol/L (ref 135–145)

## 2019-07-10 LAB — LIPID PANEL
Cholesterol: 152 mg/dL (ref 0–200)
HDL: 64 mg/dL (ref >40)
LDL Cholesterol: 73 mg/dL (ref 0–99)
Total CHOL/HDL Ratio: 2.4 RATIO
Triglycerides: 75 mg/dL (ref <150)
VLDL: 15 mg/dL (ref 0–40)

## 2019-07-10 LAB — TSH: TSH: 0.42 u[IU]/mL (ref 0.350–4.500)

## 2019-07-10 MED ORDER — FUROSEMIDE 10 MG/ML IJ SOLN
40.0000 mg | Freq: Two times a day (BID) | INTRAMUSCULAR | Status: DC
  Administered 2019-07-10 – 2019-07-12 (×6): 40 mg via INTRAVENOUS
  Filled 2019-07-10 (×6): qty 4

## 2019-07-10 MED ORDER — POTASSIUM CHLORIDE 20 MEQ PO PACK
40.0000 meq | PACK | Freq: Once | ORAL | Status: AC
  Administered 2019-07-10: 40 meq via ORAL
  Filled 2019-07-10: qty 2

## 2019-07-10 MED ORDER — METOPROLOL SUCCINATE ER 25 MG PO TB24
25.0000 mg | ORAL_TABLET | Freq: Every day | ORAL | Status: DC
  Administered 2019-07-10 – 2019-07-13 (×4): 25 mg via ORAL
  Filled 2019-07-10 (×4): qty 1

## 2019-07-10 MED ORDER — POTASSIUM CHLORIDE 20 MEQ PO PACK
40.0000 meq | PACK | Freq: Two times a day (BID) | ORAL | Status: AC
  Administered 2019-07-10 – 2019-07-11 (×3): 40 meq via ORAL
  Filled 2019-07-10 (×3): qty 2

## 2019-07-10 NOTE — Progress Notes (Addendum)
[facility] Attending note: Patient seen by Cardiology Fellow overnight.  [name] is a 78 y.o.femalewith medical history significant forHx of DVT, PEpreviously on Xarelto (discontinued due to financial constraint),prothrombin II gene mutation,HTN, restless leg, iron deficiency anemia,hemachromatosisandGERDwho presented to [facility] for neck and shoulder blade pain. Found to have evidence of HF on exam.   Had a high risk nuc stress due to possible fixed septal perfusion defect and severely decreased LV function.   CTPE negative for PE, some coronary calcifications seen.   She tells me this has been progressing since 2018, and in past 2-3 months she has been more short of breath bending over or turning in bed.  Patient seen and examined, updates to plan as follows:  Constitutional: No acute distress Eyes: sclera non-icteric, normal conjunctiva and lids ENMT: normal dentition, moist mucous membranes Cardiovascular: regular rhythm, normal rate, no murmurs. S1 and S2 normal. Radial pulses normal bilaterally. JVP elevated to angle of the jaw with expiration at 45 degrees, falls to mid 1/3 of the neck with inspiration at 45 deg. JVD persists when sitting upright at 90 deg, JVP elevated to lower 1/3 of neck.  Respiratory: faint bibasilar crackles GI : normal bowel sounds, soft and nontender. No distention.   MSK: extremities warm, well perfused. 1+ bilateral edema to the knee NEURO: grossly nonfocal exam, moves all extremities. PSYCH: alert and oriented x 3, normal mood and affect.   Plan: - continue diuresis with lasix 40 mg IV BID today, reassess tomorrow. - Will replete potassium 40 mEq BID today - Consider [facility] tomorrow if better diuresed, otherwise plan for Tuesday. Will make patient NPO, will need consent performed.  - history documents hemachromatosis - consider cardiac MRI as outpatient for further evaluation if obstructive CAD is excluded.

## 2019-07-10 NOTE — Progress Notes (Signed)
PROGRESS NOTE    [name]  WLN:[id number] DOB: 1940/09/27 DOA: 07/09/2019 PCP: Shirline Frees, MD     Brief Narrative:  [name] is a 79 y.o. female with medical history significant for Hx of DVT, PE previously on Xarelto (discontinued due to financial constraint), prothrombin II gene mutation, HTN, restless leg, iron deficiency anemia, hemachromatosis and GERD who presented to [facility] for neck and shoulder blade pain.   About a week ago she woke up with a "crick in my neck" and had trouble moving her neck and left upper extremity.  She had sharp pain in her left shoulder blade as well as up her neck.  She denies any extraneous activities or trauma.  She then saw her PCP and was given Percocet which helped with the pain and muscle relaxer which she says was not helpful.  She then presented to the ED today since the pain had migrated to her right shoulder and would have sharp pain each time she moved her right upper extremities.  She has had bilateral shoulder surgery before.  She denies any chest pain.  She has been having complaints of dyspnea but that has been ongoing for years and is worse with exertion.   Patient was evaluated at outpatient cardiology in February with a several year history of dyspnea on exertion. A 2D echo was obtain on 06/20/2019 and showed EF of 45-50% with mild global hypokinesia and mild asymmetric left ventricular hypertrophy with grade 1 diastolic dysfunction. A myoview stress test was obtained 3/12 to rule out ischemic etiology. It revealed severely decreased EF of <30% and small defect of mild severity present in the mid-anteroseptal and apical septal location. Given the decline in her heart function, cardiology at [facility] was consulted by ED physician at [facility] and it was recommended that she be transferred to [facility] and be admitted by medicine team with plans of LHC.  New events last 24 hours / Subjective: Patient having some shortness of breath with  mild exertion such as repositioning herself in bed.  Denies any chest pain.  Having some pleuritic sharp left chest wall pain near her ribs below her breast.  Denies any nausea or vomiting.  No peripheral edema.  Assessment & Plan:   Principal Problem:   CHF (congestive heart failure) (HCC) Active Problems:   History of DVT (deep vein thrombosis)   Benign essential HTN   RLS (restless legs syndrome)   Hypokalemia   Iron deficiency anemia due to chronic blood loss   Dyspnea   Respiratory failure with hypoxia (HCC)   Shoulder pain   Acute hypoxic respiratory failure in the setting of acute sysolic heart failure -CTA Chest negative for PE -Echocardiogram 06/20/2019 with EF 45 to 50%, global hypokinesis, grade 1 diastolic dysfunction -Stress test 07/08/2019 showed small defect of mild severity in the mid anterior septal and apical septal location, EF 29% -Appreciate cardiology consultation -Continues to require 2 L nasal cannula O2  Tremor -Propranolol changed to metoprolol at the time of admission  Bilateral shoulder pain -Likely to be musculoskeletal in nature -CTA chest also revealed bilateral glenohumeral joint arthropathic changes  -PT evaluation, pain control supportive care  Incidental finding of RLL lung nodule -2-3 mm nodule, right lower lobe, not evident on the prior study. No follow-up needed if patient is low-risk. Non-contrast chest CT can be considered in 12 months if patient is high-risk.  Iron deficiency anemia -Receives IV iron with hematology outpatient -Hemoglobin stable  Hx of PE/DVT -  Takes aspirin 162 mg (stopped Xarelto due to financial constraints in the past) as outpatient -Continue Lovenox  Hypertension -Continue amlodipine, HCTZ and Toprol  Restless leg  -Continue Restoril, Mirapex  Hypokalemia -Replace, trend    DVT prophylaxis: Lovenox Code Status: Full code Family Communication: No family at bedside Disposition Plan:  . Patient is  from home prior to admission. . Currently in-hospital treatment needed due to further cardiac work-up including heart cath. . Suspect patient will discharge back home once cardiac work-up complete.   Consultants:   Cardiology  Procedures:   None  Antimicrobials:  Anti-infectives (From admission, onward)   None        Objective: Vitals:   07/09/19 1747 07/09/19 2001 07/10/19 0506 07/10/19 0849  BP: 136/68 129/68 (!) 144/73 140/69  Pulse: 72 74 71 67  Resp: '18 20 20 18  ' Temp: 98.4 F (36.9 C) 98.1 F (36.7 C) 97.6 F (36.4 C) 97.8 F (36.6 C)  TempSrc: Oral Oral Oral Oral  SpO2: 99% 96% 97% 95%  Weight: 82.3 kg 81.9 kg    Height: '5\' 3"'  (1.6 m)       Intake/Output Summary (Last 24 hours) at 07/10/2019 0945 Last data filed at 07/10/2019 0504 Gross per 24 hour  Intake 120 ml  Output 2400 ml  Net -2280 ml   Filed Weights   07/09/19 1051 07/09/19 1747 07/09/19 2001  Weight: 81.6 kg 82.3 kg 81.9 kg    Examination:  General exam: Appears calm and comfortable  Respiratory system: Clear to auscultation. Respiratory effort normal. No respiratory distress. No conversational dyspnea.  On nasal cannula O2 Cardiovascular system: S1 & S2 heard, RRR. No murmurs. No pedal edema. Gastrointestinal system: Abdomen is nondistended, soft and nontender. Normal bowel sounds heard. Central nervous system: Alert and oriented. No focal neurological deficits. Speech clear.  Extremities: Symmetric in appearance  Skin: No rashes, lesions or ulcers on exposed skin  Psychiatry: Judgement and insight appear normal. Mood & affect appropriate.   Data Reviewed: I have personally reviewed following labs and imaging studies  CBC: Recent Labs  Lab 07/09/19 1120 07/10/19 0525  WBC 6.5 4.9  NEUTROABS 4.3  --   HGB 11.4* 11.2*  HCT 34.6* 34.2*  MCV 86.9 84.9  PLT 206 725   Basic Metabolic Panel: Recent Labs  Lab 07/09/19 1120 07/10/19 0525  NA 135 136  K 3.2* 3.4*  CL 96* 98  CO2  27 26  GLUCOSE 90 196*  BUN 21 24*  CREATININE 0.97 1.06*  CALCIUM 8.8* 8.9   GFR: Estimated Creatinine Clearance: 43.6 mL/min (A) (by C-G formula based on SCr of 1.06 mg/dL (H)). Liver Function Tests: No results for input(s): AST, ALT, ALKPHOS, BILITOT, PROT, ALBUMIN in the last 168 hours. No results for input(s): LIPASE, AMYLASE in the last 168 hours. No results for input(s): AMMONIA in the last 168 hours. Coagulation Profile: No results for input(s): INR, PROTIME in the last 168 hours. Cardiac Enzymes: No results for input(s): CKTOTAL, CKMB, CKMBINDEX, TROPONINI in the last 168 hours. BNP (last 3 results) No results for input(s): PROBNP in the last 8760 hours. HbA1C: No results for input(s): HGBA1C in the last 72 hours. CBG: No results for input(s): GLUCAP in the last 168 hours. Lipid Profile: Recent Labs    07/10/19 0525  CHOL 152  HDL 64  LDLCALC 73  TRIG 75  CHOLHDL 2.4   Thyroid Function Tests: Recent Labs    07/10/19 0525  TSH 0.420   Anemia Panel: No  results for input(s): VITAMINB12, FOLATE, FERRITIN, TIBC, IRON, RETICCTPCT in the last 72 hours. Sepsis Labs: No results for input(s): PROCALCITON, LATICACIDVEN in the last 168 hours.  Recent Results (from the past 240 hour(s))  SARS Coronavirus 2 Ag (30 min TAT) - Nasal Swab (BD Veritor Kit)     Status: None   Collection Time: 07/09/19  1:27 PM   Specimen: Nasal Swab (BD Veritor Kit)  Result Value Ref Range Status   SARS Coronavirus 2 Ag NEGATIVE NEGATIVE Final    Comment: (NOTE) SARS-CoV-2 antigen NOT DETECTED.  Negative results are presumptive.  Negative results do not preclude SARS-CoV-2 infection and should not be used as the sole basis for treatment or other patient management decisions, including infection  control decisions, particularly in the presence of clinical signs and  symptoms consistent with COVID-19, or in those who have been in contact with the virus.  Negative results must be combined  with clinical observations, patient history, and epidemiological information. The expected result is Negative. Fact Sheet for Patients: PodPark.tn Fact Sheet for Healthcare Providers: GiftContent.is This test is not yet approved or cleared by the [place] FDA and  has been authorized for detection and/or diagnosis of SARS-CoV-2 by FDA under an Emergency Use Authorization (EUA).  This EUA will remain in effect (meaning this test can be used) for the duration of  the COVID-19 de claration under Section 564(b)(1) of the Act, 21 U.S.C. section 360bbb-3(b)(1), unless the authorization is terminated or revoked sooner. Performed at [facility], [address] [place], [place] [address]   Respiratory Panel by RT PCR (Flu A&B, Covid) - Nasal Swab (BD Veritor Kit)     Status: None   Collection Time: 07/09/19  1:27 PM   Specimen: Nasal Swab (BD Veritor Kit)  Result Value Ref Range Status   SARS Coronavirus 2 by RT PCR NEGATIVE NEGATIVE Final    Comment: (NOTE) SARS-CoV-2 target nucleic acids are NOT DETECTED. The SARS-CoV-2 RNA is generally detectable in upper respiratoy specimens during the acute phase of infection. The lowest concentration of SARS-CoV-2 viral copies this assay can detect is 131 copies/mL. A negative result does not preclude SARS-Cov-2 infection and should not be used as the sole basis for treatment or other patient management decisions. A negative result may occur with  improper specimen collection/handling, submission of specimen other than nasopharyngeal swab, presence of viral mutation(s) within the areas targeted by this assay, and inadequate number of viral copies (<131 copies/mL). A negative result must be combined with clinical observations, patient history, and epidemiological information. The expected result is Negative. Fact Sheet for Patients:   PinkCheek.be Fact Sheet for Healthcare Providers:  GravelBags.it This test is not yet ap proved or cleared by the [place] FDA and  has been authorized for detection and/or diagnosis of SARS-CoV-2 by FDA under an Emergency Use Authorization (EUA). This EUA will remain  in effect (meaning this test can be used) for the duration of the COVID-19 declaration under Section 564(b)(1) of the Act, 21 U.S.C. section 360bbb-3(b)(1), unless the authorization is terminated or revoked sooner.    Influenza A by PCR NEGATIVE NEGATIVE Final   Influenza B by PCR NEGATIVE NEGATIVE Final    Comment: (NOTE) The Xpert Xpress SARS-CoV-2/FLU/RSV assay is intended as an aid in  the diagnosis of influenza from Nasopharyngeal swab specimens and  should not be used as a sole basis for treatment. Nasal washings and  aspirates are unacceptable for Xpert Xpress SARS-CoV-2/FLU/RSV  testing.  Fact Sheet for Patients: PinkCheek.be Fact Sheet for Healthcare Providers: GravelBags.it This test is not yet approved or cleared by the [place] FDA and  has been authorized for detection and/or diagnosis of SARS-CoV-2 by  FDA under an Emergency Use Authorization (EUA). This EUA will remain  in effect (meaning this test can be used) for the duration of the  Covid-19 declaration under Section 564(b)(1) of the Act, 21  U.S.C. section 360bbb-3(b)(1), unless the authorization is  terminated or revoked. Performed at [facility], [address] [place], [place] [address]       Radiology Studies: CT Angio Chest PE W and/or Wo Contrast  Result Date: 07/09/2019 CLINICAL DATA:  Left shoulder pain for 1 week. Right shoulder began hurting last night. Short of breath on exertion. EXAM: CT ANGIOGRAPHY CHEST WITH CONTRAST TECHNIQUE: Multidetector CT imaging of the chest was performed using the  standard protocol during bolus administration of intravenous contrast. Multiplanar CT image reconstructions and MIPs were obtained to evaluate the vascular anatomy. CONTRAST:  119m OMNIPAQUE IOHEXOL 350 MG/ML SOLN COMPARISON:  CTA, 02/21/2017 FINDINGS: Cardiovascular: There is satisfactory opacification of the pulmonary arteries to the segmental level. There is no evidence of a pulmonary embolism. Heart is normal in size and configuration. No pericardial effusion. Mild left and right coronary artery calcifications. Great vessels are normal in caliber. No aortic dissection. Aortic atherosclerosis. Mediastinum/Nodes: No neck base, axillary, mediastinal or hilar masses or enlarged lymph nodes. Moderate hiatal hernia. Esophagus is unremarkable. Normal trachea. Lungs/Pleura: Small area of ill-defined nodular opacities in the medial right middle lobe adjacent to the minor fissure, new since the prior CT. 2-3 mm nodule, posterior right lower lobe, image 61, series 5, not evident on the prior CT. Minor atelectasis at the bases. Remainder of the lungs is clear. No pleural effusion or pneumothorax. Upper Abdomen: No acute abnormality. Musculoskeletal: No fracture or acute finding. No osteoblastic or osteolytic lesions. Glenohumeral joints show significant narrowing, subchondral cystic change and sclerosis and marginal osteophytes. Review of the MIP images confirms the above findings. IMPRESSION: 1. No evidence of a pulmonary embolism. No convincing acute finding. 2. Small area of ill-defined nodular opacities in the medial right middle lobe near the minor fissure. This is likely inflammatory in etiology, and may be chronic but new since the prior exam. 3. 2-3 mm nodule, right lower lobe, not evident on the prior study. No follow-up needed if patient is low-risk. Non-contrast chest CT can be considered in 12 months if patient is high-risk. This recommendation follows the consensus statement: Guidelines for Management of  Incidental Pulmonary Nodules Detected on CT Images: From the Fleischner Society 2017; Radiology 2017; 284:228-243. 4. Bilateral glenohumeral joint arthropathic changes likely the source of this patient's shoulder pain. Aortic Atherosclerosis (ICD10-I70.0). Electronically Signed   By: DLajean ManesM.D.   On: 07/09/2019 12:49   DG Chest Port 1 View  Result Date: 07/09/2019 CLINICAL DATA:  Shortness of breath EXAM: PORTABLE CHEST 1 VIEW COMPARISON:  Chest CT is pending.  Chest x-ray 02/21/2017 FINDINGS: Cardiomegaly and lower mediastinal widening from hiatal hernia. The trachea is deviated to the right but this is likely accentuated by low volumes when compared with 2018. Lobulation of the right diaphragm. There is no edema, consolidation, effusion, or pneumothorax. IMPRESSION: 1. Cardiomegaly without pulmonary edema. 2. Hiatal hernia. Electronically Signed   By: JMonte FantasiaM.D.   On: 07/09/2019 11:47   MYOCARDIAL PERFUSION IMAGING  Result Date: 07/08/2019  The left ventricular ejection fraction  is severely decreased (<30%).  Nuclear stress EF: 29%.  There was no ST segment deviation noted during stress.  There is a small defect of mild severity present in the mid anteroseptal and apical septal location. The defect is non-reversible. This may be related to underlying LBBB. No ischemia noted.  This is a high risk study due to LV dysfunction.       Scheduled Meds: . amLODipine  10 mg Oral Daily  . aspirin EC  162 mg Oral Daily  . calcium-vitamin D  1 tablet Oral BID  . cholecalciferol  2,000 Units Oral BID  . citalopram  20 mg Oral QHS  . cycloSPORINE  1 drop Both Eyes BID  . enoxaparin (LOVENOX) injection  40 mg Subcutaneous Q24H  . hydrochlorothiazide  25 mg Oral Daily  . metoprolol succinate  25 mg Oral Daily  . pantoprazole  40 mg Oral Daily  . polycarbophil  625 mg Oral Daily  . pramipexole  0.5 mg Oral QHS  . temazepam  15 mg Oral QHS   Continuous Infusions:   LOS: 1 day       Time spent: 35 minutes   Dessa Phi, DO [facility] 07/10/2019, 9:45 AM   Available via Epic secure chat 7am-7pm After these hours, please refer to coverage provider listed on [email or web address]

## 2019-07-10 NOTE — Consult Note (Signed)
Cardiology Consultation:   Patient ID: [name] MRN: [id number]; DOB: 1940-06-03  Admit date: 07/09/2019 Date of Consult: 07/10/2019  Primary Care Provider: Shirline Frees, MD Primary Cardiologist: Gwenlyn Found Primary Electrophysiologist:  None    Patient Profile:   [name] is a 79 y.o. female with a hx of newly diagnosed HFrEF, hemachromatosis, prior PE, prior stroke, HTN, who is being seen today for the evaluation of HF at the request of Dr. Flossie Buffy.  History of Present Illness:   [name] is a 79 y.o. female with a hx of newly diagnosed HFrEF, hemachromatosis, prior PE, prior stroke, HTN, who is being seen today for the evaluation of HF at the request of Dr. Flossie Buffy.  The patient was evaluated by Dr. Gwenlyn Found in cardiology clinic in 05/2019 for assessment of DOE. She denied any prior cardiac history, although she had a known history of DVT/PE and findings of prior CVA on brain imaging. At that visit, ECG showed LBBB. Echocardiogram was ordered and showed mildly reduced LVEF of 45-50% with global hypokinesis and mild LVH. RV function and PA pressures were normal. She subsequently underwent nuclear stress test that showed a small, non-reversible defect of mild severity present in the mid anteroseptal and apical septal location; it was possibly attributed to underlying LBBB. No reversible ischemia was noted. However, this finding was high-risk as LVEF was decreased to 29% with LV dilation during stress.  The patient presented to [facility] yesterday with several days of pain in her neck and L shoulder. At the outside ED, she was noted to be hypoxic to 88%. ECG showed stable LBBB.  Labs included K 3.2, BNP 326. Initial troponin was 7, which was 16 nine hours later. The patient was given a dose of IV furosemide. She is admitted to the hospitalist serve, and cardiology was consulted for further recommendations.  On my evaluation, the patient denies any acute symptoms. She says that  she has experienced years of dyspnea that is worse with exertion but not with laying flat. She also endorses chronic LE edema. She denies any recent or prior chest pain. She states that her neck/shoulder pain began about a week ago and have been stable since. She states that she has not had any cardiac testing prior to her recent TTE and stress test.    Past Medical History:  Diagnosis Date  . Anemia   . Arthritis   . Bell's palsy   . GERD (gastroesophageal reflux disease)   . Hemochromatosis   . History of hiatal hernia   . Hypertension   . Iron deficiency anemia due to chronic blood loss 03/10/2017  . PE (pulmonary embolism)   . Pneumonia    "walking" pneumonia  . Prothrombin gene mutation ([place]) 05/30/2013  . Restless legs   . Right leg DVT ([place]) 05/30/2013  . Stroke [facility])    found on a MRI, she's not aware otherwise    Past Surgical History:  Procedure Laterality Date  . BACK SURGERY    . COLONOSCOPY    . SHOULDER SURGERY Bilateral      Home Medications:  Prior to Admission medications   Medication Sig Start Date End Date Taking? Authorizing Provider  acetaminophen (TYLENOL) 500 MG tablet Take 1,000 mg by mouth every 6 (six) hours as needed for headache (pain).   Yes [provider]  amLODipine (NORVASC) 10 MG tablet Take 10 mg by mouth daily.  04/09/17  Yes [provider]  aspirin EC 81 MG tablet  Take 162 mg by mouth daily.   Yes [provider]  Calcium Carbonate-Vitamin D (CALCIUM 600+D PO) Take 1 tablet by mouth 2 (two) times daily.   Yes [provider]  Cholecalciferol (VITAMIN D) 2000 UNITS tablet Take 2,000 Units by mouth 2 (two) times daily.   Yes [provider]  citalopram (CELEXA) 20 MG tablet Take 20 mg by mouth at bedtime.  08/05/11  Yes [provider]  colchicine 0.6 MG tablet Take 1 tablet (0.6 mg total) by mouth daily. Gout flare up. Patient taking differently: Take 0.6 mg by mouth daily as needed (gout  flare).  12/02/16  Yes Love, Ivan Anchors, PA-C  cycloSPORINE (RESTASIS) 0.05 % ophthalmic emulsion Place 1 drop into both eyes 2 (two) times daily.   Yes [provider]  fish oil-omega-3 fatty acids 1000 MG capsule Take 1 g by mouth daily.    Yes [provider]  hydrochlorothiazide (HYDRODIURIL) 25 MG tablet Take 25 mg by mouth daily.   Yes [provider]  Hypromellose (ARTIFICIAL TEARS OP) Place 1 drop into both eyes 3 (three) times daily as needed (dry eyes).   Yes [provider]  Multiple Vitamin (MULTIVITAMIN WITH MINERALS) TABS tablet Take 1 tablet by mouth daily. Centrum Silver   Yes [provider]  omeprazole (PRILOSEC) 20 MG capsule Take 20 mg by mouth daily. 07/04/19  Yes [provider]  oxyCODONE-acetaminophen (PERCOCET/ROXICET) 5-325 MG tablet Take 1 tablet by mouth every 6 (six) hours as needed (pain).  03/10/17  Yes [provider]  polycarbophil (FIBERCON) 625 MG tablet Take 625 mg by mouth in the morning and at bedtime.   Yes [provider]  pramipexole (MIRAPEX) 0.5 MG tablet Take 0.5 mg by mouth at bedtime.    Yes [provider]  propranolol (INDERAL) 10 MG tablet Take 10 mg by mouth 2 (two) times daily.  02/22/17  Yes [provider]  temazepam (RESTORIL) 15 MG capsule Take 1 capsule (15 mg total) by mouth at bedtime. 12/02/16  Yes Love, Ivan Anchors, PA-C  tiZANidine (ZANAFLEX) 4 MG tablet Take 4 mg by mouth at bedtime as needed for muscle spasms.  03/21/19  Yes [provider]  Turmeric Curcumin 500 MG CAPS Take 500 mg by mouth at bedtime.    Yes [provider]  VITAMIN E PO Take 1 capsule by mouth at bedtime.   Yes [provider]    Inpatient Medications: Scheduled Meds: . amLODipine  10 mg Oral Daily  . aspirin EC  162 mg Oral Daily  . calcium-vitamin D  1 tablet Oral BID  . cholecalciferol  2,000 Units Oral BID  . citalopram  20 mg Oral QHS  . cycloSPORINE   1 drop Both Eyes BID  . enoxaparin (LOVENOX) injection  40 mg Subcutaneous Q24H  . hydrochlorothiazide  25 mg Oral Daily  . metoprolol succinate  25 mg Oral Daily  . pantoprazole  40 mg Oral Daily  . polycarbophil  625 mg Oral Daily  . pramipexole  0.5 mg Oral QHS  . temazepam  15 mg Oral QHS   Continuous Infusions:  PRN Meds: oxyCODONE-acetaminophen  Allergies:    Allergies  Allergen Reactions  . Morphine And Related Other (See Comments)    LARGER DOSES OF MORPHINE CAUSES BODY TWITCHING  . Penicillins Anaphylaxis    Childhood reaction Has patient had a PCN reaction causing immediate rash, facial/tongue/throat swelling, SOB or lightheadedness with hypotension: Yes Has patient had a PCN  reaction causing severe rash involving mucus membranes or skin necrosis: Unknown Has patient had a PCN reaction that required hospitalization: No Has patient had a PCN reaction occurring within the last 10 years: no (5 or 79 yrs old) If all of the above answers are "NO", then may proceed with Cephalosporin use.   . Sulfa Antibiotics Other (See Comments)    Unknown childhood reaction    Social History:   Social History   Socioeconomic History  . Marital status: Married    Spouse name: Not on file  . Number of children: Not on file  . Years of education: Not on file  . Highest education level: Not on file  Occupational History  . Not on file  Tobacco Use  . Smoking status: Never Smoker  . Smokeless tobacco: Never Used  . Tobacco comment: never used tobacco  Substance and Sexual Activity  . Alcohol use: No    Alcohol/week: 0.0 standard drinks  . Drug use: No  . Sexual activity: Not on file  Other Topics Concern  . Not on file  Social History Narrative  . Not on file   Social Determinants of Health   Financial Resource Strain:   . Difficulty of Paying Living Expenses:   Food Insecurity:   . Worried About Charity fundraiser in the Last Year:   . Arboriculturist in the Last Year:    Transportation Needs:   . Film/video editor (Medical):   Marland Kitchen Lack of Transportation (Non-Medical):   Physical Activity:   . Days of Exercise per Week:   . Minutes of Exercise per Session:   Stress:   . Feeling of Stress :   Social Connections:   . Frequency of Communication with Friends and Family:   . Frequency of Social Gatherings with Friends and Family:   . Attends Religious Services:   . Active Member of Clubs or Organizations:   . Attends Archivist Meetings:   Marland Kitchen Marital Status:   Intimate Partner Violence:   . Fear of Current or Ex-Partner:   . Emotionally Abused:   Marland Kitchen Physically Abused:   . Sexually Abused:     Family History:    Family History  Problem Relation Age of Onset  . Congestive Heart Failure Mother   . Pancreatic cancer Father      ROS:  Please see the history of present illness.  All other ROS reviewed and negative.     Physical Exam/Data:   Vitals:   07/09/19 1630 07/09/19 1656 07/09/19 1747 07/09/19 2001  BP: (!) 144/78 140/67 136/68 129/68  Pulse: 69 72 72 74  Resp: 15 20 18 20   Temp: 98.5 F (36.9 C)  98.4 F (36.9 C) 98.1 F (36.7 C)  TempSrc: Oral  Oral Oral  SpO2: 97% 99% 99% 96%  Weight:   82.3 kg   Height:   5\' 3"  (1.6 m)     Intake/Output Summary (Last 24 hours) at 07/10/2019 0323 Last data filed at 07/09/2019 2100 Gross per 24 hour  Intake 120 ml  Output 2100 ml  Net -1980 ml   Last 3 Weights 07/09/2019 07/09/2019 07/08/2019  Weight (lbs) 181 lb 6.4 oz 180 lb 184 lb  Weight (kg) 82.283 kg 81.647 kg 83.462 kg     Body mass index is 32.13 kg/m.  General:  Well nourished, well developed, in no acute distress HEENT: normal, wearing [place] Neck: JVD at mid-neck at 90 degrees Cardiac:  normal S1, S2;  RRR; no murmur  Lungs:  clear to auscultation bilaterally, no wheezing, rhonchi or rales  Abd: soft, nontender  Ext: Trace LE edema Musculoskeletal:  No deformities, BUE and BLE strength normal and equal Skin: warm and  dry  Neuro:  No focal abnormalities noted Psych:  Normal affect   EKG:  The EKG was personally reviewed and demonstrates:  Sinus rhythm with LBBB Telemetry:  Telemetry was personally reviewed and demonstrates:  Occasional PVCs and short episode NSVT  Relevant CV Studies:  Echocardiogram 05/2019: 1. Mild global hypokinesis worse in the septum.. Left ventricular ejection fraction, by estimation, is 45 to 50%. The left ventricle has mildly decreased function. The left ventricle demonstrates global hypokinesis. The left ventricular internal cavity  size was mildly dilated. There is mild asymmetric left ventricular hypertrophy. Left ventricular diastolic parameters are consistent with Grade I diastolic dysfunction (impaired relaxation). Elevated left ventricular end-diastolic pressure.  2. Right ventricular systolic function is normal. The right ventricular size is normal. There is normal pulmonary artery systolic pressure.  3. Left atrial size was moderately dilated.  4. The mitral valve is normal in structure and function. Mild mitral valve regurgitation. No evidence of mitral stenosis.  5. The aortic valve is tricuspid. Aortic valve regurgitation is trivial. No aortic stenosis is present.  6. The inferior vena cava is normal in size with greater than 50% respiratory variability, suggesting right atrial pressure of 3 mmHg.   SPECT 07/08/19:  The left ventricular ejection fraction is severely decreased (<30%).  Nuclear stress EF: 29%.  There was no ST segment deviation noted during stress.  There is a small defect of mild severity present in the mid anteroseptal and apical septal location. The defect is non-reversible. This may be related to underlying LBBB. No ischemia noted.  This is a high risk study due to LV dysfunction.  Laboratory Data:  High Sensitivity Troponin:   Recent Labs  Lab 07/09/19 1120 07/09/19 1327 07/09/19 2010 07/09/19 2046  TROPONINIHS 7 8 15 16        Chemistry Recent Labs  Lab 07/09/19 1120  NA 135  K 3.2*  CL 96*  CO2 27  GLUCOSE 90  BUN 21  CREATININE 0.97  CALCIUM 8.8*  GFRNONAA 56*  GFRAA >60  ANIONGAP 12    No results for input(s): PROT, ALBUMIN, AST, ALT, ALKPHOS, BILITOT in the last 168 hours. Hematology Recent Labs  Lab 07/09/19 1120  WBC 6.5  RBC 3.98  HGB 11.4*  HCT 34.6*  MCV 86.9  MCH 28.6  MCHC 32.9  RDW 14.1  PLT 206   BNP Recent Labs  Lab 07/09/19 1120  BNP 326.5*    DDimer No results for input(s): DDIMER in the last 168 hours.   Radiology/Studies:  CT Angio Chest PE W and/or Wo Contrast  Result Date: 07/09/2019 CLINICAL DATA:  Left shoulder pain for 1 week. Right shoulder began hurting last night. Short of breath on exertion. EXAM: CT ANGIOGRAPHY CHEST WITH CONTRAST TECHNIQUE: Multidetector CT imaging of the chest was performed using the standard protocol during bolus administration of intravenous contrast. Multiplanar CT image reconstructions and MIPs were obtained to evaluate the vascular anatomy. CONTRAST:  129mL OMNIPAQUE IOHEXOL 350 MG/ML SOLN COMPARISON:  CTA, 02/21/2017 FINDINGS: Cardiovascular: There is satisfactory opacification of the pulmonary arteries to the segmental level. There is no evidence of a pulmonary embolism. Heart is normal in size and configuration. No pericardial effusion. Mild left and right coronary artery calcifications. Great vessels are normal in caliber. No aortic  dissection. Aortic atherosclerosis. Mediastinum/Nodes: No neck base, axillary, mediastinal or hilar masses or enlarged lymph nodes. Moderate hiatal hernia. Esophagus is unremarkable. Normal trachea. Lungs/Pleura: Small area of ill-defined nodular opacities in the medial right middle lobe adjacent to the minor fissure, new since the prior CT. 2-3 mm nodule, posterior right lower lobe, image 61, series 5, not evident on the prior CT. Minor atelectasis at the bases. Remainder of the lungs is clear. No pleural  effusion or pneumothorax. Upper Abdomen: No acute abnormality. Musculoskeletal: No fracture or acute finding. No osteoblastic or osteolytic lesions. Glenohumeral joints show significant narrowing, subchondral cystic change and sclerosis and marginal osteophytes. Review of the MIP images confirms the above findings. IMPRESSION: 1. No evidence of a pulmonary embolism. No convincing acute finding. 2. Small area of ill-defined nodular opacities in the medial right middle lobe near the minor fissure. This is likely inflammatory in etiology, and may be chronic but new since the prior exam. 3. 2-3 mm nodule, right lower lobe, not evident on the prior study. No follow-up needed if patient is low-risk. Non-contrast chest CT can be considered in 12 months if patient is high-risk. This recommendation follows the consensus statement: Guidelines for Management of Incidental Pulmonary Nodules Detected on CT Images: From the Fleischner Society 2017; Radiology 2017; 284:228-243. 4. Bilateral glenohumeral joint arthropathic changes likely the source of this patient's shoulder pain. Aortic Atherosclerosis (ICD10-I70.0). Electronically Signed   By: Lajean Manes M.D.   On: 07/09/2019 12:49   DG Chest Port 1 View  Result Date: 07/09/2019 CLINICAL DATA:  Shortness of breath EXAM: PORTABLE CHEST 1 VIEW COMPARISON:  Chest CT is pending.  Chest x-ray 02/21/2017 FINDINGS: Cardiomegaly and lower mediastinal widening from hiatal hernia. The trachea is deviated to the right but this is likely accentuated by low volumes when compared with 2018. Lobulation of the right diaphragm. There is no edema, consolidation, effusion, or pneumothorax. IMPRESSION: 1. Cardiomegaly without pulmonary edema. 2. Hiatal hernia. Electronically Signed   By: Monte Fantasia M.D.   On: 07/09/2019 11:47   MYOCARDIAL PERFUSION IMAGING  Result Date: 07/08/2019  The left ventricular ejection fraction is severely decreased (<30%).  Nuclear stress EF: 29%.  There  was no ST segment deviation noted during stress.  There is a small defect of mild severity present in the mid anteroseptal and apical septal location. The defect is non-reversible. This may be related to underlying LBBB. No ischemia noted.  This is a high risk study due to LV dysfunction.    {    Assessment and Plan:   HFrEF Acute hypoxic respiratory failure  The patient reports several years of DOE and was recently found to have dilated cardiomyopathy with mildly reduced LVEF on echocardiogram. The cause of her HF is not clear, and nuclear stress test showed no large scar or reversible ischemia; nevertheless, balanced ischemia may be possible given severe reduction in LVEF and LV dilation during stress. She is admitted with shoulder pain that does not seem to be of cardiac etiology. However, her workup was notable for hypoxia with elevated BNP and signs of hypervolemia on exam. In addition to management of her hypervolemia, she would she would benefit from ongoing workup of her cardiomyopathy. LHC to evaluate for CAD is appropriate, and consideration of  causes of NICM should also be performed. Finally, she will need optimization of her HF with GDMT.  -Agree with ongoing diuresis. -Starting low-dose metoprolol XL. She takes propranolol BID for tremors. I discussed discontinuing this medication at this  time with ongoing titration of metoprolol. If she continues to have tremors with metoprolol treatment, can consider restarting propranolol with close monitoring of HR. -Will benefit from ACEI/ARB following addition of BB -We will follow along to determine timing of LHC.  -May benefit from cMRI based on above findings to evaluate for causes on NICM. Infiltrative disease could be considered given her evidence of conduction disease and PVCs. In particular, can consider hemachromatosis-mediated cardiomyopathy, although iron panels in the system are unremarkable. -Lipid panel, TSH ordered  Shoulder  pain The patient reports almost a week of sustained shoulder/neck pain. Given her persistent pain with normal troponin and stable ECG, it is unlikely that these symptoms represent anginal equivalent. Rather suspect this to be due to MSK etiology.  Hx DVT/PE The patient was previously treated with anticoagulation. Chronic PE is unlikely to be the cause of her LV dysfunction, although Dr. Gwenlyn Found has discussed VQ scan to evaluate for CTEPH as a contributing cause of her dyspnea.   HTN Continue treatment of HTN. Will need medications for optimization of HF, as above.   For questions or updates, please contact [place] Please consult [email or web address] for contact info under     Signed, Nila Nephew, MD  07/10/2019 3:23 AM

## 2019-07-10 NOTE — Evaluation (Signed)
Physical Therapy Evaluation Patient Details Name: [name] MRN: [id number] DOB: 10/31/1940 Today's Date: 07/10/2019   History of Present Illness  Pt adm with pain around shoulder blades and neck. Thought to be musculoskelatal. Pt also with CHF. PMH - HTN, Bell's Palsy, lumbar fusion with post op revision and resultant foot drop  Clinical Impression  PT ordered to address pt's pain around shoulder blades and neck. Pt reports pain is now gone. Gave pt exercise program with exercises below. Also instructed pt in using tennis ball under paraspinal thoracic area in supine to provide gentle pressure. Also instructed in using towel roll place vertically along thoracic spine in supine to provide gentle stretch of anterior chest. Pt verbalized understanding of these.     Follow Up Recommendations No PT follow up    Equipment Recommendations  None recommended by PT    Recommendations for Other Services       Precautions / Restrictions Precautions Precautions: Other (comment) Precaution Comments: foot drop      Mobility  Bed Mobility Overal bed mobility: Modified Independent                Transfers Overall transfer level: Modified independent Equipment used: Rolling walker (2 wheeled)                Ambulation/Gait Ambulation/Gait assistance: Modified independent (Device/Increase time) Gait Distance (Feet): 15 Feet(x 2) Assistive device: Rolling walker (2 wheeled) Gait Pattern/deviations: Step-through pattern;Decreased dorsiflexion - left   Gait velocity interpretation: 1.31 - 2.62 ft/sec, indicative of limited community ambulator General Gait Details: Steady gait with pt compensating for lt foot drop  Stairs            Wheelchair Mobility    Modified Rankin (Stroke Patients Only)       Balance Overall balance assessment: Mild deficits observed, not formally tested                                           Pertinent Vitals/Pain  Pain Assessment: No/denies pain    Home Living Family/patient expects to be discharged to:: Private residence Living Arrangements: Spouse/significant other Available Help at Discharge: Family;Available 24 hours/day Type of Home: House Home Access: Stairs to enter   [employer] of Steps: 2 Home Layout: One level Home Equipment: Walker - 2 wheels;Cane - single point;Shower seat      Prior Function Level of Independence: Independent         Comments: Pt doesn't use her AFO because it causes her shoe to rub her foot.      Hand Dominance        Extremity/Trunk Assessment   Upper Extremity Assessment Upper Extremity Assessment: RUE deficits/detail RUE Deficits / Details: shoulder flexion to 80 degrees actively due to rotator cuff issues.    Lower Extremity Assessment Lower Extremity Assessment: LLE deficits/detail LLE Deficits / Details: dorsiflexion 0/5    Cervical / Trunk Assessment Cervical / Trunk Assessment: Other exceptions Cervical / Trunk Exceptions: limited lateral neck rotation and lateral sidebending  Communication   Communication: No difficulties  Cognition Arousal/Alertness: Awake/alert Behavior During Therapy: WFL for tasks assessed/performed Overall Cognitive Status: Within Functional Limits for tasks assessed  General Comments      Exercises Other Exercises Other Exercises: scapular squeezes x 5 Other Exercises: lateral neck rotation x 3 Other Exercises: lateral neck side bend x 2   Assessment/Plan    PT Assessment Patent does not need any further PT services  PT Problem List         PT Treatment Interventions      PT Goals (Current goals can be found in the Care Plan section)  Acute Rehab PT Goals PT Goal Formulation: All assessment and education complete, DC therapy    Frequency     Barriers to discharge        Co-evaluation               AM-PAC PT "6  Clicks" Mobility  Outcome Measure Help needed turning from your back to your side while in a flat bed without using bedrails?: None Help needed moving from lying on your back to sitting on the side of a flat bed without using bedrails?: None Help needed moving to and from a bed to a chair (including a wheelchair)?: None Help needed standing up from a chair using your arms (e.g., wheelchair or bedside chair)?: None Help needed to walk in hospital room?: None Help needed climbing 3-5 steps with a railing? : A Little 6 Click Score: 23    End of Session   Activity Tolerance: Patient tolerated treatment well Patient left: in bed;with family/visitor present(sitting EOB)   PT Visit Diagnosis: Other (comment)    [id number] PT Time Calculation (min) (ACUTE ONLY): 15 min   Charges:   PT Evaluation $PT Eval Low Complexity: 1 Low          [place] Pager [phone] Office [place] 07/10/2019, 4:40 PM

## 2019-07-11 DIAGNOSIS — I5043 Acute on chronic combined systolic (congestive) and diastolic (congestive) heart failure: Secondary | ICD-10-CM

## 2019-07-11 DIAGNOSIS — R931 Abnormal findings on diagnostic imaging of heart and coronary circulation: Secondary | ICD-10-CM

## 2019-07-11 DIAGNOSIS — I10 Essential (primary) hypertension: Secondary | ICD-10-CM

## 2019-07-11 DIAGNOSIS — Z86718 Personal history of other venous thrombosis and embolism: Secondary | ICD-10-CM

## 2019-07-11 LAB — CBC
HCT: 33.6 % — ABNORMAL LOW (ref 36.0–46.0)
Hemoglobin: 10.8 g/dL — ABNORMAL LOW (ref 12.0–15.0)
MCH: 28.1 pg (ref 26.0–34.0)
MCHC: 32.1 g/dL (ref 30.0–36.0)
MCV: 87.5 fL (ref 80.0–100.0)
Platelets: 210 10*3/uL (ref 150–400)
RBC: 3.84 MIL/uL — ABNORMAL LOW (ref 3.87–5.11)
RDW: 14.1 % (ref 11.5–15.5)
WBC: 7.5 10*3/uL (ref 4.0–10.5)
nRBC: 0 % (ref 0.0–0.2)

## 2019-07-11 LAB — BASIC METABOLIC PANEL
Anion gap: 15 (ref 5–15)
BUN: 30 mg/dL — ABNORMAL HIGH (ref 8–23)
CO2: 33 mmol/L — ABNORMAL HIGH (ref 22–32)
Calcium: 9.3 mg/dL (ref 8.9–10.3)
Chloride: 95 mmol/L — ABNORMAL LOW (ref 98–111)
Creatinine, Ser: 1.06 mg/dL — ABNORMAL HIGH (ref 0.44–1.00)
GFR calc Af Amer: 58 mL/min — ABNORMAL LOW (ref >60)
GFR calc non Af Amer: 50 mL/min — ABNORMAL LOW (ref >60)
Glucose, Bld: 84 mg/dL (ref 70–99)
Potassium: 3.4 mmol/L — ABNORMAL LOW (ref 3.5–5.1)
Sodium: 143 mmol/L (ref 135–145)

## 2019-07-11 LAB — MAGNESIUM: Magnesium: 1.6 mg/dL — ABNORMAL LOW (ref 1.7–2.4)

## 2019-07-11 MED ORDER — SODIUM CHLORIDE 0.9% FLUSH
3.0000 mL | Freq: Two times a day (BID) | INTRAVENOUS | Status: DC
  Administered 2019-07-11 – 2019-07-13 (×4): 3 mL via INTRAVENOUS

## 2019-07-11 MED ORDER — MAGNESIUM SULFATE 2 GM/50ML IV SOLN
2.0000 g | Freq: Once | INTRAVENOUS | Status: AC
  Administered 2019-07-11: 2 g via INTRAVENOUS
  Filled 2019-07-11: qty 50

## 2019-07-11 NOTE — Progress Notes (Signed)
Progress Note  Patient Name: [name] Date of Encounter: 07/11/2019  Primary Cardiologist: Gwenlyn Found  Subjective   Complains of mild pleuritic chest discomfort lower left chest, no retrosternal pain. Still has roughly 1+ pretibial edema bilaterally. Denies orthopnea, but sleeps with the head of the bed elevated due to GERD.  Inpatient Medications    Scheduled Meds: . amLODipine  10 mg Oral Daily  . calcium-vitamin D  1 tablet Oral BID  . cholecalciferol  2,000 Units Oral BID  . citalopram  20 mg Oral QHS  . cycloSPORINE  1 drop Both Eyes BID  . enoxaparin (LOVENOX) injection  40 mg Subcutaneous Q24H  . furosemide  40 mg Intravenous BID  . metoprolol succinate  25 mg Oral Daily  . pantoprazole  40 mg Oral Daily  . polycarbophil  625 mg Oral Daily  . potassium chloride  40 mEq Oral BID  . pramipexole  0.5 mg Oral QHS  . sodium chloride flush  3 mL Intravenous Q12H  . temazepam  15 mg Oral QHS   Continuous Infusions: . magnesium sulfate bolus IVPB     PRN Meds: oxyCODONE-acetaminophen   Vital Signs    Vitals:   07/11/19 0500 07/11/19 0558 07/11/19 0700 07/11/19 0731  BP: 138/65   (!) 150/59  Pulse: 66   (!) 58  Resp: 19 17 18 18   Temp: 97.7 F (36.5 C)   97.7 F (36.5 C)  TempSrc: Oral   Oral  SpO2: 99%     Weight:  81 kg    Height:        Intake/Output Summary (Last 24 hours) at 07/11/2019 1012 Last data filed at 07/11/2019 0846 Gross per 24 hour  Intake 720 ml  Output 2101 ml  Net -1381 ml   Last 3 Weights 07/11/2019 07/09/2019 07/09/2019  Weight (lbs) 178 lb 8 oz 180 lb 9.6 oz 181 lb 6.4 oz  Weight (kg) 80.967 kg 81.92 kg 82.283 kg      Telemetry    Sinus rhythm- Personally Reviewed  ECG    Sinus rhythm, LBBB- Personally Reviewed  Physical Exam  Comfortable with head of bed elevated at about 30 degrees GEN: No acute distress.   Neck:  7-8 cm elevation in JVP Cardiac: RRR, toxically split second heart sound no murmurs, rubs, or gallops.    Respiratory: Clear to auscultation bilaterally. GI: Soft, nontender, non-distended  MS:  1+ symmetrical pretibial edema; No deformity. Neuro:  Nonfocal  Psych: Normal affect   Labs    High Sensitivity Troponin:   Recent Labs  Lab 07/09/19 1120 07/09/19 1327 07/09/19 2010 07/09/19 2046  TROPONINIHS 7 8 15 16       Chemistry Recent Labs  Lab 07/09/19 1120 07/10/19 0525 07/11/19 0528  NA 135 136 143  K 3.2* 3.4* 3.4*  CL 96* 98 95*  CO2 27 26 33*  GLUCOSE 90 196* 84  BUN 21 24* 30*  CREATININE 0.97 1.06* 1.06*  CALCIUM 8.8* 8.9 9.3  GFRNONAA 56* 50* 50*  GFRAA >60 58* 58*  ANIONGAP 12 12 15      Hematology Recent Labs  Lab 07/09/19 1120 07/10/19 0525 07/11/19 0528  WBC 6.5 4.9 7.5  RBC 3.98 4.03 3.84*  HGB 11.4* 11.2* 10.8*  HCT 34.6* 34.2* 33.6*  MCV 86.9 84.9 87.5  MCH 28.6 27.8 28.1  MCHC 32.9 32.7 32.1  RDW 14.1 13.8 14.1  PLT 206 196 210    BNP Recent Labs  Lab 07/09/19 1120  BNP 326.5*  DDimer No results for input(s): DDIMER in the last 168 hours.   Radiology    CT Angio Chest PE W and/or Wo Contrast  Result Date: 07/09/2019 CLINICAL DATA:  Left shoulder pain for 1 week. Right shoulder began hurting last night. Short of breath on exertion. EXAM: CT ANGIOGRAPHY CHEST WITH CONTRAST TECHNIQUE: Multidetector CT imaging of the chest was performed using the standard protocol during bolus administration of intravenous contrast. Multiplanar CT image reconstructions and MIPs were obtained to evaluate the vascular anatomy. CONTRAST:  167mL OMNIPAQUE IOHEXOL 350 MG/ML SOLN COMPARISON:  CTA, 02/21/2017 FINDINGS: Cardiovascular: There is satisfactory opacification of the pulmonary arteries to the segmental level. There is no evidence of a pulmonary embolism. Heart is normal in size and configuration. No pericardial effusion. Mild left and right coronary artery calcifications. Great vessels are normal in caliber. No aortic dissection. Aortic atherosclerosis.  Mediastinum/Nodes: No neck base, axillary, mediastinal or hilar masses or enlarged lymph nodes. Moderate hiatal hernia. Esophagus is unremarkable. Normal trachea. Lungs/Pleura: Small area of ill-defined nodular opacities in the medial right middle lobe adjacent to the minor fissure, new since the prior CT. 2-3 mm nodule, posterior right lower lobe, image 61, series 5, not evident on the prior CT. Minor atelectasis at the bases. Remainder of the lungs is clear. No pleural effusion or pneumothorax. Upper Abdomen: No acute abnormality. Musculoskeletal: No fracture or acute finding. No osteoblastic or osteolytic lesions. Glenohumeral joints show significant narrowing, subchondral cystic change and sclerosis and marginal osteophytes. Review of the MIP images confirms the above findings. IMPRESSION: 1. No evidence of a pulmonary embolism. No convincing acute finding. 2. Small area of ill-defined nodular opacities in the medial right middle lobe near the minor fissure. This is likely inflammatory in etiology, and may be chronic but new since the prior exam. 3. 2-3 mm nodule, right lower lobe, not evident on the prior study. No follow-up needed if patient is low-risk. Non-contrast chest CT can be considered in 12 months if patient is high-risk. This recommendation follows the consensus statement: Guidelines for Management of Incidental Pulmonary Nodules Detected on CT Images: From the Fleischner Society 2017; Radiology 2017; 284:228-243. 4. Bilateral glenohumeral joint arthropathic changes likely the source of this patient's shoulder pain. Aortic Atherosclerosis (ICD10-I70.0). Electronically Signed   By: Lajean Manes M.D.   On: 07/09/2019 12:49   DG Chest Port 1 View  Result Date: 07/09/2019 CLINICAL DATA:  Shortness of breath EXAM: PORTABLE CHEST 1 VIEW COMPARISON:  Chest CT is pending.  Chest x-ray 02/21/2017 FINDINGS: Cardiomegaly and lower mediastinal widening from hiatal hernia. The trachea is deviated to the  right but this is likely accentuated by low volumes when compared with 2018. Lobulation of the right diaphragm. There is no edema, consolidation, effusion, or pneumothorax. IMPRESSION: 1. Cardiomegaly without pulmonary edema. 2. Hiatal hernia. Electronically Signed   By: Monte Fantasia M.D.   On: 07/09/2019 11:47    Cardiac Studies   Nuclear stress test 07/08/2019  The left ventricular ejection fraction is severely decreased (<30%).  Nuclear stress EF: 29%.  There was no ST segment deviation noted during stress.  There is a small defect of mild severity present in the mid anteroseptal and apical septal location. The defect is non-reversible. This may be related to underlying LBBB. No ischemia noted.  This is a high risk study due to LV dysfunction.    Echo 06/20/2019 1. Mild global hypokinesis worse in the septum.. Left ventricular  ejection fraction, by estimation, is 45 to 50%. The  left ventricle has  mildly decreased function. The left ventricle demonstrates global  hypokinesis. The left ventricular internal cavity  size was mildly dilated. There is mild asymmetric left ventricular  hypertrophy. Left ventricular diastolic parameters are consistent with  Grade I diastolic dysfunction (impaired relaxation). Elevated left  ventricular end-diastolic pressure.  2. Right ventricular systolic function is normal. The right ventricular  size is normal. There is normal pulmonary artery systolic pressure.  3. Left atrial size was moderately dilated.  4. The mitral valve is normal in structure and function. Mild mitral  valve regurgitation. No evidence of mitral stenosis.  5. The aortic valve is tricuspid. Aortic valve regurgitation is trivial.  No aortic stenosis is present.  6. The inferior vena cava is normal in size with greater than 50%  respiratory variability, suggesting right atrial pressure of 3 mmHg.   Patient Profile     79 y.o. female with a remote history of pulmonary  embolism, recently diagnosed reduction in left ventricular ejection fraction, LBBB, history of hemochromatosis, remote history of asymptomatic stroke, essential hypertension presenting with left neck and shoulder discomfort and shortness of breath with hypoxemia.  Assessment & Plan    1. CHF: At least mildly depressed left ventricular systolic function (EF [id number] by echo, 29% by scintigraphy), improving after treatment with diuretics.  Longstanding problems with exertional dyspnea and some suspicion for chronic thromboembolic disease.  We will continue diuretics 1 more day since there is still evidence of volume overload on physical exam, plan right and left heart catheterization tomorrow at 0900 hrs. with Dr. Peter Martinique. This procedure has been fully reviewed with the patient and written informed consent has been obtained.  On beta-blockers and diuretics.  Start RAAS inhibitors after her catheterization. 2.  Abnormal nuclear stress test : Fixed septal defect may simply be an artifact related to LBBB, but in the setting of CHF and depressed LVEF coronary angiography is indicated.   3. Pleuritic chest pain: CT negative for pulmonary embolism.  Cannot exclude a small pulmonary infarction, smaller than the resolution of the CT angiogram.  She has not been on long-acting anticoagulants at home.  Currently on prophylactic dose LMWH. 4. Hx of Hemochromatosis: Consider outpatient cardiac MRI, but note that the patient's ferritin has been consistently low (range 17-76 over the last 3-4 years).  This would make cardiac involvement from hemochromatosis highly unlikely.     For questions or updates, please contact [place] Please consult [email or web address] for contact info under        Signed, Sanda Klein, MD  07/11/2019, 10:12 AM

## 2019-07-11 NOTE — Plan of Care (Signed)
  Problem: Activity: Goal: Risk for activity intolerance will decrease Outcome: Progressing   Problem: Pain Managment: Goal: General experience of comfort will improve Outcome: Progressing   Problem: Safety: Goal: Ability to remain free from injury will improve Outcome: Progressing   

## 2019-07-11 NOTE — Progress Notes (Signed)
PROGRESS NOTE    [name]  OVZ:[id number] DOB: May 11, 1940 DOA: 07/09/2019 PCP: Shirline Frees, MD     Brief Narrative:  [name] is a 79 y.o. female with medical history significant for Hx of DVT, PE previously on Xarelto (discontinued due to financial constraint), prothrombin II gene mutation, HTN, restless leg, iron deficiency anemia, hemachromatosis and GERD who presented to [facility] for neck and shoulder blade pain.   About a week ago she woke up with a "crick in my neck" and had trouble moving her neck and left upper extremity.  She had sharp pain in her left shoulder blade as well as up her neck.  She denies any extraneous activities or trauma.  She then saw her PCP and was given Percocet which helped with the pain and muscle relaxer which she says was not helpful.  She then presented to the ED today since the pain had migrated to her right shoulder and would have sharp pain each time she moved her right upper extremities.  She has had bilateral shoulder surgery before.  She denies any chest pain.  She has been having complaints of dyspnea but that has been ongoing for years and is worse with exertion.   Patient was evaluated at outpatient cardiology in February with a several year history of dyspnea on exertion. A 2D echo was obtain on 06/20/2019 and showed EF of 45-50% with mild global hypokinesia and mild asymmetric left ventricular hypertrophy with grade 1 diastolic dysfunction. A myoview stress test was obtained 3/12 to rule out ischemic etiology. It revealed severely decreased EF of <30% and small defect of mild severity present in the mid-anteroseptal and apical septal location. Given the decline in her heart function, cardiology at [facility] was consulted by ED physician at [facility] and it was recommended that she be transferred to [facility] and be admitted by medicine team with plans of heart cath.  New events last 24 hours / Subjective: Continues to have some shortness of  breath with minimal activity.  Denies any chest pain other than the pleuritic left lower chest pain that worsens with deep breaths.  Assessment & Plan:   Principal Problem:   CHF (congestive heart failure) (HCC) Active Problems:   History of DVT (deep vein thrombosis)   Benign essential HTN   RLS (restless legs syndrome)   Hypokalemia   Iron deficiency anemia due to chronic blood loss   Dyspnea   Respiratory failure with hypoxia (HCC)   Shoulder pain   Acute hypoxic respiratory failure in the setting of acute sysolic heart failure -CTA Chest negative for PE -Echocardiogram 06/20/2019 with EF 45 to 50%, global hypokinesis, grade 1 diastolic dysfunction -Stress test 07/08/2019 showed small defect of mild severity in the mid anterior septal and apical septal location, EF 29% -Appreciate cardiology consultation -Continues to require 2 L nasal cannula O2 -Continue IV Lasix  Tremor -Propranolol changed to metoprolol at the time of admission  Bilateral shoulder pain -Likely to be musculoskeletal in nature -CTA chest also revealed bilateral glenohumeral joint arthropathic changes  -PT evaluation, pain control supportive care  Incidental finding of RLL lung nodule -2-3 mm nodule, right lower lobe, not evident on the prior study. No follow-up needed if patient is low-risk. Non-contrast chest CT can be considered in 12 months if patient is high-risk.  Iron deficiency anemia -Receives IV iron with hematology outpatient -Hemoglobin stable  Hx of PE/DVT -Takes aspirin 162 mg (stopped Xarelto due to financial constraints in the past)  as outpatient -Continue Lovenox  Hypertension -Continue amlodipine and Toprol  Restless leg  -Continue Restoril, Mirapex  Hypokalemia -Replace, trend  Hypomagnesemia -Replace, trend    DVT prophylaxis: Lovenox Code Status: Full code Family Communication: No family at bedside Disposition Plan:  . Patient is from home prior to  admission. . Currently in-hospital treatment needed due to further cardiac work-up including heart cath. . Suspect patient will discharge back home once cardiac work-up complete.   Consultants:   Cardiology  Procedures:   None  Antimicrobials:  Anti-infectives (From admission, onward)   None       Objective: Vitals:   07/11/19 0500 07/11/19 0558 07/11/19 0700 07/11/19 0731  BP: 138/65   (!) 150/59  Pulse: 66   (!) 58  Resp: _0 Temp: 97.7 F (36.5 C)   97.7 F (36.5 C)  TempSrc: Oral   Oral  SpO2: 99%     Weight:  81 kg    Height:        Intake/Output Summary (Last 24 hours) at 07/11/2019 1007 Last data filed at 07/11/2019 0846 Gross per 24 hour  Intake 720 ml  Output 2101 ml  Net -1381 ml   Filed Weights   07/09/19 1747 07/09/19 2001 07/11/19 0558  Weight: 82.3 kg 81.9 kg 81 kg    Examination: General exam: Appears calm and comfortable  Respiratory system: Clear to auscultation. Respiratory effort normal.  No conversational dyspnea or distress noted Cardiovascular system: S1 & S2 heard, RRR. No pedal edema. Gastrointestinal system: Abdomen is nondistended, soft and nontender. Normal bowel sounds heard. Central nervous system: Alert and oriented. Non focal exam. Speech clear  Extremities: Symmetric in appearance bilaterally  Skin: No rashes, lesions or ulcers on exposed skin  Psychiatry: Judgement and insight appear stable. Mood & affect appropriate.    Data Reviewed: I have personally reviewed following labs and imaging studies  CBC: Recent Labs  Lab 07/09/19 1120 07/10/19 0525 07/11/19 0528  WBC 6.5 4.9 7.5  NEUTROABS 4.3  --   --   HGB 11.4* 11.2* 10.8*  HCT 34.6* 34.2* 33.6*  MCV 86.9 84.9 87.5  PLT 206 196 528   Basic Metabolic Panel: Recent Labs  Lab 07/09/19 1120 07/10/19 0525 07/11/19 0528  NA 135 136 143  K 3.2* 3.4* 3.4*  CL 96* 98 95*  CO2 27 26 33*  GLUCOSE 90 196* 84  BUN 21 24* 30*  CREATININE 0.97 1.06* 1.06*   CALCIUM 8.8* 8.9 9.3  MG  --   --  1.6*   GFR: Estimated Creatinine Clearance: 43.3 mL/min (A) (by C-G formula based on SCr of 1.06 mg/dL (H)). Liver Function Tests: No results for input(s): AST, ALT, ALKPHOS, BILITOT, PROT, ALBUMIN in the last 168 hours. No results for input(s): LIPASE, AMYLASE in the last 168 hours. No results for input(s): AMMONIA in the last 168 hours. Coagulation Profile: No results for input(s): INR, PROTIME in the last 168 hours. Cardiac Enzymes: No results for input(s): CKTOTAL, CKMB, CKMBINDEX, TROPONINI in the last 168 hours. BNP (last 3 results) No results for input(s): PROBNP in the last 8760 hours. HbA1C: No results for input(s): HGBA1C in the last 72 hours. CBG: No results for input(s): GLUCAP in the last 168 hours. Lipid Profile: Recent Labs    07/10/19 0525  CHOL 152  HDL 64  LDLCALC 73  TRIG 75  CHOLHDL 2.4   Thyroid Function Tests: Recent Labs    07/10/19 0525  TSH 0.420  Anemia Panel: No results for input(s): VITAMINB12, FOLATE, FERRITIN, TIBC, IRON, RETICCTPCT in the last 72 hours. Sepsis Labs: No results for input(s): PROCALCITON, LATICACIDVEN in the last 168 hours.  Recent Results (from the past 240 hour(s))  SARS Coronavirus 2 Ag (30 min TAT) - Nasal Swab (BD Veritor Kit)     Status: None   Collection Time: 07/09/19  1:27 PM   Specimen: Nasal Swab (BD Veritor Kit)  Result Value Ref Range Status   SARS Coronavirus 2 Ag NEGATIVE NEGATIVE Final    Comment: (NOTE) SARS-CoV-2 antigen NOT DETECTED.  Negative results are presumptive.  Negative results do not preclude SARS-CoV-2 infection and should not be used as the sole basis for treatment or other patient management decisions, including infection  control decisions, particularly in the presence of clinical signs and  symptoms consistent with COVID-19, or in those who have been in contact with the virus.  Negative results must be combined with clinical observations, patient  history, and epidemiological information. The expected result is Negative. Fact Sheet for Patients: PodPark.tn Fact Sheet for Healthcare Providers: GiftContent.is This test is not yet approved or cleared by the [place] FDA and  has been authorized for detection and/or diagnosis of SARS-CoV-2 by FDA under an Emergency Use Authorization (EUA).  This EUA will remain in effect (meaning this test can be used) for the duration of  the COVID-19 de claration under Section 564(b)(1) of the Act, 21 U.S.C. section 360bbb-3(b)(1), unless the authorization is terminated or revoked sooner. Performed at [facility], [address] [place], [place] [address]   Respiratory Panel by RT PCR (Flu A&B, Covid) - Nasal Swab (BD Veritor Kit)     Status: None   Collection Time: 07/09/19  1:27 PM   Specimen: Nasal Swab (BD Veritor Kit)  Result Value Ref Range Status   SARS Coronavirus 2 by RT PCR NEGATIVE NEGATIVE Final    Comment: (NOTE) SARS-CoV-2 target nucleic acids are NOT DETECTED. The SARS-CoV-2 RNA is generally detectable in upper respiratoy specimens during the acute phase of infection. The lowest concentration of SARS-CoV-2 viral copies this assay can detect is 131 copies/mL. A negative result does not preclude SARS-Cov-2 infection and should not be used as the sole basis for treatment or other patient management decisions. A negative result may occur with  improper specimen collection/handling, submission of specimen other than nasopharyngeal swab, presence of viral mutation(s) within the areas targeted by this assay, and inadequate number of viral copies (<131 copies/mL). A negative result must be combined with clinical observations, patient history, and epidemiological information. The expected result is Negative. Fact Sheet for Patients:  PinkCheek.be Fact Sheet for Healthcare Providers:   GravelBags.it This test is not yet ap proved or cleared by the [place] FDA and  has been authorized for detection and/or diagnosis of SARS-CoV-2 by FDA under an Emergency Use Authorization (EUA). This EUA will remain  in effect (meaning this test can be used) for the duration of the COVID-19 declaration under Section 564(b)(1) of the Act, 21 U.S.C. section 360bbb-3(b)(1), unless the authorization is terminated or revoked sooner.    Influenza A by PCR NEGATIVE NEGATIVE Final   Influenza B by PCR NEGATIVE NEGATIVE Final    Comment: (NOTE) The Xpert Xpress SARS-CoV-2/FLU/RSV assay is intended as an aid in  the diagnosis of influenza from Nasopharyngeal swab specimens and  should not be used as a sole basis for treatment. Nasal washings and  aspirates are unacceptable for Xpert Xpress  SARS-CoV-2/FLU/RSV  testing. Fact Sheet for Patients: PinkCheek.be Fact Sheet for Healthcare Providers: GravelBags.it This test is not yet approved or cleared by the [place] FDA and  has been authorized for detection and/or diagnosis of SARS-CoV-2 by  FDA under an Emergency Use Authorization (EUA). This EUA will remain  in effect (meaning this test can be used) for the duration of the  Covid-19 declaration under Section 564(b)(1) of the Act, 21  U.S.C. section 360bbb-3(b)(1), unless the authorization is  terminated or revoked. Performed at [facility], [address], [place], [place] [address]       Radiology Studies: CT Angio Chest PE W and/or Wo Contrast  Result Date: 07/09/2019 CLINICAL DATA:  Left shoulder pain for 1 week. Right shoulder began hurting last night. Short of breath on exertion. EXAM: CT ANGIOGRAPHY CHEST WITH CONTRAST TECHNIQUE: Multidetector CT imaging of the chest was performed using the standard protocol during bolus administration of intravenous contrast. Multiplanar CT  image reconstructions and MIPs were obtained to evaluate the vascular anatomy. CONTRAST:  1109m OMNIPAQUE IOHEXOL 350 MG/ML SOLN COMPARISON:  CTA, 02/21/2017 FINDINGS: Cardiovascular: There is satisfactory opacification of the pulmonary arteries to the segmental level. There is no evidence of a pulmonary embolism. Heart is normal in size and configuration. No pericardial effusion. Mild left and right coronary artery calcifications. Great vessels are normal in caliber. No aortic dissection. Aortic atherosclerosis. Mediastinum/Nodes: No neck base, axillary, mediastinal or hilar masses or enlarged lymph nodes. Moderate hiatal hernia. Esophagus is unremarkable. Normal trachea. Lungs/Pleura: Small area of ill-defined nodular opacities in the medial right middle lobe adjacent to the minor fissure, new since the prior CT. 2-3 mm nodule, posterior right lower lobe, image 61, series 5, not evident on the prior CT. Minor atelectasis at the bases. Remainder of the lungs is clear. No pleural effusion or pneumothorax. Upper Abdomen: No acute abnormality. Musculoskeletal: No fracture or acute finding. No osteoblastic or osteolytic lesions. Glenohumeral joints show significant narrowing, subchondral cystic change and sclerosis and marginal osteophytes. Review of the MIP images confirms the above findings. IMPRESSION: 1. No evidence of a pulmonary embolism. No convincing acute finding. 2. Small area of ill-defined nodular opacities in the medial right middle lobe near the minor fissure. This is likely inflammatory in etiology, and may be chronic but new since the prior exam. 3. 2-3 mm nodule, right lower lobe, not evident on the prior study. No follow-up needed if patient is low-risk. Non-contrast chest CT can be considered in 12 months if patient is high-risk. This recommendation follows the consensus statement: Guidelines for Management of Incidental Pulmonary Nodules Detected on CT Images: From the Fleischner Society 2017;  Radiology 2017; 284:228-243. 4. Bilateral glenohumeral joint arthropathic changes likely the source of this patient's shoulder pain. Aortic Atherosclerosis (ICD10-I70.0). Electronically Signed   By: DLajean ManesM.D.   On: 07/09/2019 12:49   DG Chest Port 1 View  Result Date: 07/09/2019 CLINICAL DATA:  Shortness of breath EXAM: PORTABLE CHEST 1 VIEW COMPARISON:  Chest CT is pending.  Chest x-ray 02/21/2017 FINDINGS: Cardiomegaly and lower mediastinal widening from hiatal hernia. The trachea is deviated to the right but this is likely accentuated by low volumes when compared with 2018. Lobulation of the right diaphragm. There is no edema, consolidation, effusion, or pneumothorax. IMPRESSION: 1. Cardiomegaly without pulmonary edema. 2. Hiatal hernia. Electronically Signed   By: JMonte FantasiaM.D.   On: 07/09/2019 11:47      Scheduled Meds: . amLODipine  10 mg  Oral Daily  . calcium-vitamin D  1 tablet Oral BID  . cholecalciferol  2,000 Units Oral BID  . citalopram  20 mg Oral QHS  . cycloSPORINE  1 drop Both Eyes BID  . enoxaparin (LOVENOX) injection  40 mg Subcutaneous Q24H  . furosemide  40 mg Intravenous BID  . metoprolol succinate  25 mg Oral Daily  . pantoprazole  40 mg Oral Daily  . polycarbophil  625 mg Oral Daily  . potassium chloride  40 mEq Oral BID  . pramipexole  0.5 mg Oral QHS  . temazepam  15 mg Oral QHS   Continuous Infusions:   LOS: 2 days      Time spent: 25 minutes   Dessa Phi, DO [facility] 07/11/2019, 10:07 AM   Available via Epic secure chat 7am-7pm After these hours, please refer to coverage provider listed on [email or web address]

## 2019-07-12 ENCOUNTER — Telehealth: Payer: Self-pay

## 2019-07-12 ENCOUNTER — Encounter (HOSPITAL_COMMUNITY)
Admission: EM | Disposition: A | Discharge status: Home / Self Care | Payer: Self-pay | Source: Home / Self Care | Attending: Internal Medicine

## 2019-07-12 DIAGNOSIS — R0602 Shortness of breath: Secondary | ICD-10-CM

## 2019-07-12 DIAGNOSIS — R0609 Other forms of dyspnea: Secondary | ICD-10-CM

## 2019-07-12 HISTORY — PX: RIGHT/LEFT HEART CATH AND CORONARY ANGIOGRAPHY: CATH118266

## 2019-07-12 LAB — MAGNESIUM: Magnesium: 1.8 mg/dL (ref 1.7–2.4)

## 2019-07-12 LAB — BASIC METABOLIC PANEL
Anion gap: 14 (ref 5–15)
BUN: 37 mg/dL — ABNORMAL HIGH (ref 8–23)
CO2: 34 mmol/L — ABNORMAL HIGH (ref 22–32)
Calcium: 9.5 mg/dL (ref 8.9–10.3)
Chloride: 93 mmol/L — ABNORMAL LOW (ref 98–111)
Creatinine, Ser: 1.26 mg/dL — ABNORMAL HIGH (ref 0.44–1.00)
GFR calc Af Amer: 47 mL/min — ABNORMAL LOW (ref >60)
GFR calc non Af Amer: 40 mL/min — ABNORMAL LOW (ref >60)
Glucose, Bld: 89 mg/dL (ref 70–99)
Potassium: 3.8 mmol/L (ref 3.5–5.1)
Sodium: 141 mmol/L (ref 135–145)

## 2019-07-12 LAB — CBC
HCT: 39.3 % (ref 36.0–46.0)
Hemoglobin: 12.7 g/dL (ref 12.0–15.0)
MCH: 28.4 pg (ref 26.0–34.0)
MCHC: 32.3 g/dL (ref 30.0–36.0)
MCV: 87.9 fL (ref 80.0–100.0)
Platelets: 254 10*3/uL (ref 150–400)
RBC: 4.47 MIL/uL (ref 3.87–5.11)
RDW: 14 % (ref 11.5–15.5)
WBC: 5.4 10*3/uL (ref 4.0–10.5)
nRBC: 0 % (ref 0.0–0.2)

## 2019-07-12 SURGERY — RIGHT/LEFT HEART CATH AND CORONARY ANGIOGRAPHY
Anesthesia: LOCAL

## 2019-07-12 MED ORDER — SODIUM CHLORIDE 0.9 % IV SOLN: 250.0000 mL | INTRAVENOUS | Status: DC | PRN

## 2019-07-12 MED ORDER — DIAZEPAM 2 MG PO TABS: 2.0000 mg | ORAL_TABLET | Freq: Four times a day (QID) | ORAL | Status: DC | PRN

## 2019-07-12 MED ORDER — HEPARIN (PORCINE) IN NACL 1000-0.9 UT/500ML-% IV SOLN
INTRAVENOUS | Status: DC | PRN
  Administered 2019-07-12: 500 mL

## 2019-07-12 MED ORDER — SODIUM CHLORIDE 0.9% FLUSH: 3.0000 mL | INTRAVENOUS | Status: DC | PRN

## 2019-07-12 MED ORDER — LIDOCAINE HCL (PF) 1 % IJ SOLN
INTRAMUSCULAR | Status: DC | PRN
  Administered 2019-07-12: 2 mL
  Administered 2019-07-12: 1 mL

## 2019-07-12 MED ORDER — SODIUM CHLORIDE 0.9 % IV SOLN: INTRAVENOUS | Status: DC

## 2019-07-12 MED ORDER — MIDAZOLAM HCL 2 MG/2ML IJ SOLN
INTRAMUSCULAR | Status: DC | PRN
  Administered 2019-07-12: 1 mg via INTRAVENOUS

## 2019-07-12 MED ORDER — HEPARIN SODIUM (PORCINE) 5000 UNIT/ML IJ SOLN: 5000.0000 [IU] | Freq: Three times a day (TID) | INTRAMUSCULAR | Status: DC

## 2019-07-12 MED ORDER — FENTANYL CITRATE (PF) 100 MCG/2ML IJ SOLN
INTRAMUSCULAR | Status: AC
  Filled 2019-07-12: qty 2

## 2019-07-12 MED ORDER — ASPIRIN 81 MG PO CHEW
81.0000 mg | CHEWABLE_TABLET | ORAL | Status: AC
  Administered 2019-07-12: 81 mg via ORAL
  Filled 2019-07-12: qty 1

## 2019-07-12 MED ORDER — HEPARIN SODIUM (PORCINE) 1000 UNIT/ML IJ SOLN
INTRAMUSCULAR | Status: DC | PRN
  Administered 2019-07-12: 4000 [IU] via INTRAVENOUS

## 2019-07-12 MED ORDER — ASPIRIN 81 MG PO CHEW
81.0000 mg | CHEWABLE_TABLET | Freq: Every day | ORAL | Status: DC
  Administered 2019-07-12 – 2019-07-13 (×2): 81 mg via ORAL
  Filled 2019-07-12 (×2): qty 1

## 2019-07-12 MED ORDER — IOHEXOL 350 MG/ML SOLN
INTRAVENOUS | Status: DC | PRN
  Administered 2019-07-12: 20 mL

## 2019-07-12 MED ORDER — SODIUM CHLORIDE 0.9 % IV SOLN: INTRAVENOUS | Status: AC

## 2019-07-12 MED ORDER — MIDAZOLAM HCL 2 MG/2ML IJ SOLN
INTRAMUSCULAR | Status: AC
  Filled 2019-07-12: qty 2

## 2019-07-12 MED ORDER — SODIUM CHLORIDE 0.9% FLUSH
3.0000 mL | Freq: Two times a day (BID) | INTRAVENOUS | Status: DC
  Administered 2019-07-13: 3 mL via INTRAVENOUS

## 2019-07-12 MED ORDER — LABETALOL HCL 5 MG/ML IV SOLN: 10.0000 mg | INTRAVENOUS | Status: AC | PRN

## 2019-07-12 MED ORDER — VERAPAMIL HCL 2.5 MG/ML IV SOLN
INTRAVENOUS | Status: DC | PRN
  Administered 2019-07-12: 10 mL via INTRA_ARTERIAL

## 2019-07-12 MED ORDER — VERAPAMIL HCL 2.5 MG/ML IV SOLN
INTRAVENOUS | Status: AC
  Filled 2019-07-12: qty 2

## 2019-07-12 MED ORDER — LIDOCAINE HCL (PF) 1 % IJ SOLN
INTRAMUSCULAR | Status: AC
  Filled 2019-07-12: qty 30

## 2019-07-12 MED ORDER — HEPARIN (PORCINE) IN NACL 1000-0.9 UT/500ML-% IV SOLN
INTRAVENOUS | Status: AC
  Filled 2019-07-12: qty 1000

## 2019-07-12 MED ORDER — ACETAMINOPHEN 325 MG PO TABS: 650.0000 mg | ORAL_TABLET | ORAL | Status: DC | PRN

## 2019-07-12 MED ORDER — ONDANSETRON HCL 4 MG/2ML IJ SOLN: 4.0000 mg | Freq: Four times a day (QID) | INTRAMUSCULAR | Status: DC | PRN

## 2019-07-12 MED ORDER — HYDRALAZINE HCL 20 MG/ML IJ SOLN: 10.0000 mg | INTRAMUSCULAR | Status: AC | PRN

## 2019-07-12 MED ORDER — ATORVASTATIN CALCIUM 40 MG PO TABS
40.0000 mg | ORAL_TABLET | Freq: Every day | ORAL | Status: DC
  Administered 2019-07-12: 40 mg via ORAL
  Filled 2019-07-12: qty 1

## 2019-07-12 MED ORDER — FENTANYL CITRATE (PF) 100 MCG/2ML IJ SOLN
INTRAMUSCULAR | Status: DC | PRN
  Administered 2019-07-12: 25 ug via INTRAVENOUS

## 2019-07-12 SURGICAL SUPPLY — 15 items
CATH BALLN WEDGE 5F 110CM (CATHETERS) ×1 IMPLANT
CATH OPTITORQUE TIG 4.0 5F (CATHETERS) ×1 IMPLANT
DEVICE RAD COMP TR BAND LRG (VASCULAR PRODUCTS) ×1 IMPLANT
GLIDESHEATH SLEND SS 6F .021 (SHEATH) ×1 IMPLANT
GUIDEWIRE INQWIRE 1.5J.035X260 (WIRE) IMPLANT
INQWIRE 1.5J .035X260CM (WIRE) ×2
KIT HEART LEFT (KITS) ×2 IMPLANT
PACK CARDIAC CATHETERIZATION (CUSTOM PROCEDURE TRAY) ×2 IMPLANT
SHEATH GLIDE SLENDER 4/5FR (SHEATH) ×1 IMPLANT
SHEATH PROBE COVER 6X72 (BAG) ×1 IMPLANT
TRANSDUCER W/STOPCOCK (MISCELLANEOUS) ×2 IMPLANT
TUBING ART PRESS 72  MALE/FEM (TUBING) ×4
TUBING ART PRESS 72 MALE/FEM (TUBING) IMPLANT
TUBING CIL FLEX 10 FLL-RA (TUBING) ×2 IMPLANT
WIRE HI TORQ VERSACORE-J 145CM (WIRE) ×1 IMPLANT

## 2019-07-12 NOTE — Telephone Encounter (Signed)
Spoke to patient she stated she is presently in [place].Stated she had a cardiac cath this morning.Advised Dr.Berry wants her to have a VQ scan.Sheduler will call her back tomorrow with appointment.

## 2019-07-12 NOTE — Progress Notes (Signed)
PROGRESS NOTE    ELLYCE LAFEVERS  LSL:[id number] DOB: 05/25/1940 DOA: 07/09/2019 PCP: Shirline Frees, MD     Brief Narrative:  [name] is a 79 y.o. female with medical history significant for Hx of DVT, PE previously on Xarelto (discontinued due to financial constraint), prothrombin II gene mutation, HTN, restless leg, iron deficiency anemia, hemachromatosis and GERD who presented to [facility] for neck and shoulder blade pain.   About a week ago she woke up with a "crick in my neck" and had trouble moving her neck and left upper extremity.  She had sharp pain in her left shoulder blade as well as up her neck.  She denies any extraneous activities or trauma.  She then saw her PCP and was given Percocet which helped with the pain and muscle relaxer which she says was not helpful.  She then presented to the ED today since the pain had migrated to her right shoulder and would have sharp pain each time she moved her right upper extremities.  She has had bilateral shoulder surgery before.  She denies any chest pain.  She has been having complaints of dyspnea but that has been ongoing for years and is worse with exertion.   Patient was evaluated at outpatient cardiology in February with a several year history of dyspnea on exertion. A 2D echo was obtain on 06/20/2019 and showed EF of 45-50% with mild global hypokinesia and mild asymmetric left ventricular hypertrophy with grade 1 diastolic dysfunction. A myoview stress test was obtained 3/12 to rule out ischemic etiology. It revealed severely decreased EF of <30% and small defect of mild severity present in the mid-anteroseptal and apical septal location. Given the decline in her heart function, cardiology at [facility] was consulted by ED physician at [facility] and it was recommended that she be transferred to [facility] and be admitted by medicine team with plans of heart cath.  New events last 24 hours / Subjective: Had some shoulder pain which resolved  with percocet.   Assessment & Plan:   Principal Problem:   CHF (congestive heart failure) (HCC) Active Problems:   History of DVT (deep vein thrombosis)   Benign essential HTN   RLS (restless legs syndrome)   Hypokalemia   Iron deficiency anemia due to chronic blood loss   Dyspnea   Respiratory failure with hypoxia (HCC)   Shoulder pain   Abnormal nuclear cardiac imaging test   Acute hypoxic respiratory failure in the setting of acute sysolic heart failure -CTA Chest negative for PE -Echocardiogram 06/20/2019 with EF 45 to 50%, global hypokinesis, grade 1 diastolic dysfunction -Stress test 07/08/2019 showed small defect of mild severity in the mid anterior septal and apical septal location, EF 29% -Appreciate cardiology consultation -Continues to require 2 L nasal cannula O2 -Continue IV Lasix -Heart cath 3/16  CKD stage IIIa -Baseline creatinine 0.9-1.2 -Stable  Tremor -Propranolol changed to metoprolol at the time of admission  Bilateral shoulder pain -Likely to be musculoskeletal in nature -CTA chest also revealed bilateral glenohumeral joint arthropathic changes  -PT evaluation, pain control supportive care  Incidental finding of RLL lung nodule -2-3 mm nodule, right lower lobe, not evident on the prior study. No follow-up needed if patient is low-risk. Non-contrast chest CT can be considered in 12 months if patient is high-risk.  Iron deficiency anemia -Receives IV iron with hematology outpatient -Hemoglobin stable  Hx of PE/DVT -Takes aspirin 162 mg (stopped Xarelto due to financial constraints in the past) as  outpatient -Continue Lovenox  Hypertension -Continue amlodipine and Toprol  Restless leg  -Continue Restoril, Mirapex     DVT prophylaxis: Lovenox Code Status: Full code Family Communication: No family at bedside Disposition Plan:  . Patient is from home prior to admission. . Currently in-hospital treatment needed due to further cardiac  work-up including heart cath, planned for 3/16 . Suspect patient will discharge back home once cardiac work-up complete.   Consultants:   Cardiology  Procedures:   None  Antimicrobials:  Anti-infectives (From admission, onward)   None       Objective: Vitals:   07/11/19 1206 07/11/19 1900 07/11/19 2002 07/12/19 0400  BP: (!) 148/70 134/82 138/80 131/79  Pulse: 61  65 63  Resp: _0 Temp: 97.9 F (36.6 C) 98.1 F (36.7 C) 98.1 F (36.7 C) 98 F (36.7 C)  TempSrc: Oral Oral Oral Oral  SpO2: 98% 97% 96% 98%  Weight:    79.4 kg  Height:        Intake/Output Summary (Last 24 hours) at 07/12/2019 1016 Last data filed at 07/12/2019 2778 Gross per 24 hour  Intake 460.47 ml  Output 1700 ml  Net -1239.53 ml   Filed Weights   07/09/19 2001 07/11/19 0558 07/12/19 0400  Weight: 81.9 kg 81 kg 79.4 kg    Examination: General exam: Appears calm and comfortable  Respiratory system: Clear to auscultation. Respiratory effort normal.  On nasal cannula O2 Cardiovascular system: S1 & S2 heard, RRR. No pedal edema. Gastrointestinal system: Abdomen is nondistended, soft and nontender. Normal bowel sounds heard. Central nervous system: Alert and oriented. Non focal exam. Speech clear  Extremities: Symmetric in appearance bilaterally  Skin: No rashes, lesions or ulcers on exposed skin  Psychiatry: Judgement and insight appear stable. Mood & affect appropriate.    Data Reviewed: I have personally reviewed following labs and imaging studies  CBC: Recent Labs  Lab 07/09/19 1120 07/10/19 0525 07/11/19 0528  WBC 6.5 4.9 7.5  NEUTROABS 4.3  --   --   HGB 11.4* 11.2* 10.8*  HCT 34.6* 34.2* 33.6*  MCV 86.9 84.9 87.5  PLT 206 196 242   Basic Metabolic Panel: Recent Labs  Lab 07/09/19 1120 07/10/19 0525 07/11/19 0528 07/12/19 0321  NA 135 136 143 141  K 3.2* 3.4* 3.4* 3.8  CL 96* 98 95* 93*  CO2 27 26 33* 34*  GLUCOSE 90 196* 84 89  BUN 21 24* 30* 37*    CREATININE 0.97 1.06* 1.06* 1.26*  CALCIUM 8.8* 8.9 9.3 9.5  MG  --   --  1.6* 1.8   GFR: Estimated Creatinine Clearance: 36.1 mL/min (A) (by C-G formula based on SCr of 1.26 mg/dL (H)). Liver Function Tests: No results for input(s): AST, ALT, ALKPHOS, BILITOT, PROT, ALBUMIN in the last 168 hours. No results for input(s): LIPASE, AMYLASE in the last 168 hours. No results for input(s): AMMONIA in the last 168 hours. Coagulation Profile: No results for input(s): INR, PROTIME in the last 168 hours. Cardiac Enzymes: No results for input(s): CKTOTAL, CKMB, CKMBINDEX, TROPONINI in the last 168 hours. BNP (last 3 results) No results for input(s): PROBNP in the last 8760 hours. HbA1C: No results for input(s): HGBA1C in the last 72 hours. CBG: No results for input(s): GLUCAP in the last 168 hours. Lipid Profile: Recent Labs    07/10/19 0525  CHOL 152  HDL 64  LDLCALC 73  TRIG 75  CHOLHDL 2.4   Thyroid Function Tests: Recent Labs  07/10/19 0525  TSH 0.420   Anemia Panel: No results for input(s): VITAMINB12, FOLATE, FERRITIN, TIBC, IRON, RETICCTPCT in the last 72 hours. Sepsis Labs: No results for input(s): PROCALCITON, LATICACIDVEN in the last 168 hours.  Recent Results (from the past 240 hour(s))  SARS Coronavirus 2 Ag (30 min TAT) - Nasal Swab (BD Veritor Kit)     Status: None   Collection Time: 07/09/19  1:27 PM   Specimen: Nasal Swab (BD Veritor Kit)  Result Value Ref Range Status   SARS Coronavirus 2 Ag NEGATIVE NEGATIVE Final    Comment: (NOTE) SARS-CoV-2 antigen NOT DETECTED.  Negative results are presumptive.  Negative results do not preclude SARS-CoV-2 infection and should not be used as the sole basis for treatment or other patient management decisions, including infection  control decisions, particularly in the presence of clinical signs and  symptoms consistent with COVID-19, or in those who have been in contact with the virus.  Negative results must be  combined with clinical observations, patient history, and epidemiological information. The expected result is Negative. Fact Sheet for Patients: PodPark.tn Fact Sheet for Healthcare Providers: GiftContent.is This test is not yet approved or cleared by the [place] FDA and  has been authorized for detection and/or diagnosis of SARS-CoV-2 by FDA under an Emergency Use Authorization (EUA).  This EUA will remain in effect (meaning this test can be used) for the duration of  the COVID-19 de claration under Section 564(b)(1) of the Act, 21 U.S.C. section 360bbb-3(b)(1), unless the authorization is terminated or revoked sooner. Performed at [facility], [address] [place], [place] [address]   Respiratory Panel by RT PCR (Flu A&B, Covid) - Nasal Swab (BD Veritor Kit)     Status: None   Collection Time: 07/09/19  1:27 PM   Specimen: Nasal Swab (BD Veritor Kit)  Result Value Ref Range Status   SARS Coronavirus 2 by RT PCR NEGATIVE NEGATIVE Final    Comment: (NOTE) SARS-CoV-2 target nucleic acids are NOT DETECTED. The SARS-CoV-2 RNA is generally detectable in upper respiratoy specimens during the acute phase of infection. The lowest concentration of SARS-CoV-2 viral copies this assay can detect is 131 copies/mL. A negative result does not preclude SARS-Cov-2 infection and should not be used as the sole basis for treatment or other patient management decisions. A negative result may occur with  improper specimen collection/handling, submission of specimen other than nasopharyngeal swab, presence of viral mutation(s) within the areas targeted by this assay, and inadequate number of viral copies (<131 copies/mL). A negative result must be combined with clinical observations, patient history, and epidemiological information. The expected result is Negative. Fact Sheet for Patients:    PinkCheek.be Fact Sheet for Healthcare Providers:  GravelBags.it This test is not yet ap proved or cleared by the [place] FDA and  has been authorized for detection and/or diagnosis of SARS-CoV-2 by FDA under an Emergency Use Authorization (EUA). This EUA will remain  in effect (meaning this test can be used) for the duration of the COVID-19 declaration under Section 564(b)(1) of the Act, 21 U.S.C. section 360bbb-3(b)(1), unless the authorization is terminated or revoked sooner.    Influenza A by PCR NEGATIVE NEGATIVE Final   Influenza B by PCR NEGATIVE NEGATIVE Final    Comment: (NOTE) The Xpert Xpress SARS-CoV-2/FLU/RSV assay is intended as an aid in  the diagnosis of influenza from Nasopharyngeal swab specimens and  should not be used as a sole basis for treatment. Nasal washings  and  aspirates are unacceptable for Xpert Xpress SARS-CoV-2/FLU/RSV  testing. Fact Sheet for Patients: PinkCheek.be Fact Sheet for Healthcare Providers: GravelBags.it This test is not yet approved or cleared by the [place] FDA and  has been authorized for detection and/or diagnosis of SARS-CoV-2 by  FDA under an Emergency Use Authorization (EUA). This EUA will remain  in effect (meaning this test can be used) for the duration of the  Covid-19 declaration under Section 564(b)(1) of the Act, 21  U.S.C. section 360bbb-3(b)(1), unless the authorization is  terminated or revoked. Performed at [facility], [address], [place], [place] [address]       Radiology Studies: No results found.    Scheduled Meds: . [MAR Hold] amLODipine  10 mg Oral Daily  . [MAR Hold] calcium-vitamin D  1 tablet Oral BID  . [MAR Hold] cholecalciferol  2,000 Units Oral BID  . [MAR Hold] citalopram  20 mg Oral QHS  . [MAR Hold] cycloSPORINE  1 drop Both Eyes BID  . [MAR Hold]  enoxaparin (LOVENOX) injection  40 mg Subcutaneous Q24H  . [MAR Hold] furosemide  40 mg Intravenous BID  . [MAR Hold] metoprolol succinate  25 mg Oral Daily  . [MAR Hold] pantoprazole  40 mg Oral Daily  . [MAR Hold] polycarbophil  625 mg Oral Daily  . [MAR Hold] pramipexole  0.5 mg Oral QHS  . [MAR Hold] sodium chloride flush  3 mL Intravenous Q12H  . [MAR Hold] temazepam  15 mg Oral QHS   Continuous Infusions: . sodium chloride    . sodium chloride 50 mL/hr (07/12/19 0939)     LOS: 3 days      Time spent: 25 minutes   Dessa Phi, DO [facility] 07/12/2019, 10:16 AM   Available via Epic secure chat 7am-7pm After these hours, please refer to coverage provider listed on [email or web address]

## 2019-07-12 NOTE — Plan of Care (Signed)
  Problem: Education: Goal: Knowledge of General Education information will improve Description: Including pain rating scale, medication(s)/side effects and non-pharmacologic comfort measures Outcome: Progressing   Problem: Health Behavior/Discharge Planning: Goal: Ability to manage health-related needs will improve Outcome: Progressing   Problem: Clinical Measurements: Goal: Will remain free from infection Outcome: Progressing   

## 2019-07-12 NOTE — Progress Notes (Signed)
Progress Note  Patient Name: [name] Date of Encounter: 07/12/2019  Primary Cardiologist: Gwenlyn Found  Subjective   Had some shoulder discomfort that resolved with an analgesic last night.  No longer complains of pleuritic left-sided chest pain.  Otherwise had a good rest and is breathing well this morning.  Edema has resolved. Net diuresis 5 L since admission.  Weight down another 1.5 kg since yesterday, net weight reduction 3 kg since peak at admission. Creatinine inching up to 1.26 this morning, we have probably reached limits of diuresis.  Inpatient Medications    Scheduled Meds: . amLODipine  10 mg Oral Daily  . calcium-vitamin D  1 tablet Oral BID  . cholecalciferol  2,000 Units Oral BID  . citalopram  20 mg Oral QHS  . cycloSPORINE  1 drop Both Eyes BID  . enoxaparin (LOVENOX) injection  40 mg Subcutaneous Q24H  . furosemide  40 mg Intravenous BID  . metoprolol succinate  25 mg Oral Daily  . pantoprazole  40 mg Oral Daily  . polycarbophil  625 mg Oral Daily  . pramipexole  0.5 mg Oral QHS  . sodium chloride flush  3 mL Intravenous Q12H  . temazepam  15 mg Oral QHS   Continuous Infusions: . sodium chloride    . sodium chloride 10 mL/hr at 07/12/19 0609   PRN Meds: sodium chloride, oxyCODONE-acetaminophen, sodium chloride flush   Vital Signs    Vitals:   07/11/19 1206 07/11/19 1900 07/11/19 2002 07/12/19 0400  BP: (!) 148/70 134/82 138/80 131/79  Pulse: 61  65 63  Resp: 18  18 17   Temp: 97.9 F (36.6 C) 98.1 F (36.7 C) 98.1 F (36.7 C) 98 F (36.7 C)  TempSrc: Oral Oral Oral Oral  SpO2: 98% 97% 96% 98%  Weight:    79.4 kg  Height:        Intake/Output Summary (Last 24 hours) at 07/12/2019 0836 Last data filed at 07/12/2019 [id number] Gross per 24 hour  Intake 460.47 ml  Output 2000 ml  Net -1539.53 ml   Last 3 Weights 07/12/2019 07/11/2019 07/09/2019  Weight (lbs) 175 lb 178 lb 8 oz 180 lb 9.6 oz  Weight (kg) 79.379 kg 80.967 kg 81.92 kg       Telemetry    Sinus rhythm- Personally Reviewed  ECG    Sinus rhythm, LBBB - Personally Reviewed  Physical Exam   Almost fully flat GEN: No acute distress.   Neck:  4-5 cm JVD Cardiac: RRR, paradoxically split S2, no murmurs, rubs, or gallops.  Respiratory: Clear to auscultation bilaterally. GI: Soft, nontender, non-distended  MS: No edema; No deformity. Neuro:  Nonfocal  Psych: Normal affect   Labs    High Sensitivity Troponin:   Recent Labs  Lab 07/09/19 1120 07/09/19 1327 07/09/19 2010 07/09/19 2046  TROPONINIHS 7 8 15 16       Chemistry Recent Labs  Lab 07/10/19 0525 07/11/19 0528 07/12/19 0321  NA 136 143 141  K 3.4* 3.4* 3.8  CL 98 95* 93*  CO2 26 33* 34*  GLUCOSE 196* 84 89  BUN 24* 30* 37*  CREATININE 1.06* 1.06* 1.26*  CALCIUM 8.9 9.3 9.5  GFRNONAA 50* 50* 40*  GFRAA 58* 58* 47*  ANIONGAP 12 15 14      Hematology Recent Labs  Lab 07/09/19 1120 07/10/19 0525 07/11/19 0528  WBC 6.5 4.9 7.5  RBC 3.98 4.03 3.84*  HGB 11.4* 11.2* 10.8*  HCT 34.6* 34.2* 33.6*  MCV 86.9 84.9 87.5  MCH 28.6 27.8 28.1  MCHC 32.9 32.7 32.1  RDW 14.1 13.8 14.1  PLT 206 196 210    BNP Recent Labs  Lab 07/09/19 1120  BNP 326.5*     DDimer No results for input(s): DDIMER in the last 168 hours.   Radiology    No results found.  Cardiac Studies   Nuclear stress test 07/08/2019  The left ventricular ejection fraction is severely decreased (<30%).  Nuclear stress EF: 29%.  There was no ST segment deviation noted during stress.  There is a small defect of mild severity present in the mid anteroseptal and apical septal location. The defect is non-reversible. This may be related to underlying LBBB. No ischemia noted.  This is a high risk study due to LV dysfunction.   Echo 06/20/2019 1. Mild global hypokinesis worse in the septum.. Left ventricular  ejection fraction, by estimation, is 45 to 50%. The left ventricle has  mildly decreased function.  The left ventricle demonstrates global  hypokinesis. The left ventricular internal cavity  size was mildly dilated. There is mild asymmetric left ventricular  hypertrophy. Left ventricular diastolic parameters are consistent with  Grade I diastolic dysfunction (impaired relaxation). Elevated left  ventricular end-diastolic pressure.  2. Right ventricular systolic function is normal. The right ventricular  size is normal. There is normal pulmonary artery systolic pressure.  3. Left atrial size was moderately dilated.  4. The mitral valve is normal in structure and function. Mild mitral  valve regurgitation. No evidence of mitral stenosis.  5. The aortic valve is tricuspid. Aortic valve regurgitation is trivial.  No aortic stenosis is present.  6. The inferior vena cava is normal in size with greater than 50%  respiratory variability, suggesting right atrial pressure of 3 mmHg  Patient Profile     79 y.o. female with a remote history of pulmonary embolism, recently diagnosed reduction in left ventricular ejection fraction, LBBB, history of hemochromatosis, remote history of asymptomatic stroke, essential hypertension presenting with left neck and shoulder discomfort and shortness of breath with hypoxemia.  Assessment & Plan    1. CHF: At least mildly depressed left ventricular systolic function (EF [id number] by echo, 29% by scintigraphy), improving after treatment with diuretics.  Longstanding problems with exertional dyspnea and some suspicion for chronic thromboembolic disease.    She appears to be currently euvolemic.  Scheduled for right and left heart catheterization today at 0900 hrs. with Dr. Ellouise Newer. This procedure has been fully reviewed with the patient and written informed consent has been obtained.  On beta-blockers and diuretics.  Start RAAS inhibitors after her catheterization.  Consider CRT if heart failure remains a problem after she is on optimize medical therapy. 2.  Abnormal  nuclear stress test :  Clarify whether or not she has CAD today.  The fixed septal defect may simply be an artifact related to LBBB. 3. Pleuritic chest pain: CT negative for pulmonary embolism.  Cannot exclude a small pulmonary infarction, smaller than the resolution of the CT angiogram.  She has not been on long-acting anticoagulants at home.  Currently on prophylactic dose LMWH. 4. Hx of Hemochromatosis: Consider outpatient cardiac MRI, but note that the patient's ferritin has been consistently low (range 17-76 over the last 3-4 years).  This would make cardiac involvement from hemochromatosis highly unlikely.     For questions or updates, please contact [facility] Please consult [email or web address] for contact info under        Signed, Sanda Klein, MD  07/12/2019,  8:36 AM

## 2019-07-12 NOTE — Interval H&P Note (Signed)
Cath Lab Visit (complete for each Cath Lab visit)  Clinical Evaluation Leading to the Procedure:   ACS: No.  Non-ACS:    Anginal Classification: CCS II  Anti-ischemic medical therapy: Maximal Therapy (2 or more classes of medications)  Non-Invasive Test Results: High-risk stress test findings: cardiac mortality >3%/year  Prior CABG: No previous CABG      History and Physical Interval Note:  07/12/2019 9:17 AM  [name]  has presented today for surgery, with the diagnosis of heart failure.  The various methods of treatment have been discussed with the patient and family. After consideration of risks, benefits and other options for treatment, the patient has consented to  Procedure(s): RIGHT/LEFT HEART CATH AND CORONARY ANGIOGRAPHY (N/A) as a surgical intervention.  The patient's history has been reviewed, patient examined, no change in status, stable for surgery.  I have reviewed the patient's chart and labs.  Questions were answered to the patient's satisfaction.     Shelva Majestic

## 2019-07-12 NOTE — H&P (View-Only) (Signed)
Progress Note  Patient Name: [name] Date of Encounter: 07/12/2019  Primary Cardiologist: Gwenlyn Found  Subjective   Had some shoulder discomfort that resolved with an analgesic last night.  No longer complains of pleuritic left-sided chest pain.  Otherwise had a good rest and is breathing well this morning.  Edema has resolved. Net diuresis 5 L since admission.  Weight down another 1.5 kg since yesterday, net weight reduction 3 kg since peak at admission. Creatinine inching up to 1.26 this morning, we have probably reached limits of diuresis.  Inpatient Medications    Scheduled Meds: . amLODipine  10 mg Oral Daily  . calcium-vitamin D  1 tablet Oral BID  . cholecalciferol  2,000 Units Oral BID  . citalopram  20 mg Oral QHS  . cycloSPORINE  1 drop Both Eyes BID  . enoxaparin (LOVENOX) injection  40 mg Subcutaneous Q24H  . furosemide  40 mg Intravenous BID  . metoprolol succinate  25 mg Oral Daily  . pantoprazole  40 mg Oral Daily  . polycarbophil  625 mg Oral Daily  . pramipexole  0.5 mg Oral QHS  . sodium chloride flush  3 mL Intravenous Q12H  . temazepam  15 mg Oral QHS   Continuous Infusions: . sodium chloride    . sodium chloride 10 mL/hr at 07/12/19 0609   PRN Meds: sodium chloride, oxyCODONE-acetaminophen, sodium chloride flush   Vital Signs    Vitals:   07/11/19 1206 07/11/19 1900 07/11/19 2002 07/12/19 0400  BP: (!) 148/70 134/82 138/80 131/79  Pulse: 61  65 63  Resp: 18  18 17   Temp: 97.9 F (36.6 C) 98.1 F (36.7 C) 98.1 F (36.7 C) 98 F (36.7 C)  TempSrc: Oral Oral Oral Oral  SpO2: 98% 97% 96% 98%  Weight:    79.4 kg  Height:        Intake/Output Summary (Last 24 hours) at 07/12/2019 0836 Last data filed at 07/12/2019 [id number] Gross per 24 hour  Intake 460.47 ml  Output 2000 ml  Net -1539.53 ml   Last 3 Weights 07/12/2019 07/11/2019 07/09/2019  Weight (lbs) 175 lb 178 lb 8 oz 180 lb 9.6 oz  Weight (kg) 79.379 kg 80.967 kg 81.92 kg       Telemetry    Sinus rhythm- Personally Reviewed  ECG    Sinus rhythm, LBBB - Personally Reviewed  Physical Exam   Almost fully flat GEN: No acute distress.   Neck:  4-5 cm JVD Cardiac: RRR, paradoxically split S2, no murmurs, rubs, or gallops.  Respiratory: Clear to auscultation bilaterally. GI: Soft, nontender, non-distended  MS: No edema; No deformity. Neuro:  Nonfocal  Psych: Normal affect   Labs    High Sensitivity Troponin:   Recent Labs  Lab 07/09/19 1120 07/09/19 1327 07/09/19 2010 07/09/19 2046  TROPONINIHS 7 8 15 16       Chemistry Recent Labs  Lab 07/10/19 0525 07/11/19 0528 07/12/19 0321  NA 136 143 141  K 3.4* 3.4* 3.8  CL 98 95* 93*  CO2 26 33* 34*  GLUCOSE 196* 84 89  BUN 24* 30* 37*  CREATININE 1.06* 1.06* 1.26*  CALCIUM 8.9 9.3 9.5  GFRNONAA 50* 50* 40*  GFRAA 58* 58* 47*  ANIONGAP 12 15 14      Hematology Recent Labs  Lab 07/09/19 1120 07/10/19 0525 07/11/19 0528  WBC 6.5 4.9 7.5  RBC 3.98 4.03 3.84*  HGB 11.4* 11.2* 10.8*  HCT 34.6* 34.2* 33.6*  MCV 86.9 84.9 87.5  MCH 28.6 27.8 28.1  MCHC 32.9 32.7 32.1  RDW 14.1 13.8 14.1  PLT 206 196 210    BNP Recent Labs  Lab 07/09/19 1120  BNP 326.5*     DDimer No results for input(s): DDIMER in the last 168 hours.   Radiology    No results found.  Cardiac Studies   Nuclear stress test 07/08/2019  The left ventricular ejection fraction is severely decreased (<30%).  Nuclear stress EF: 29%.  There was no ST segment deviation noted during stress.  There is a small defect of mild severity present in the mid anteroseptal and apical septal location. The defect is non-reversible. This may be related to underlying LBBB. No ischemia noted.  This is a high risk study due to LV dysfunction.   Echo 06/20/2019 1. Mild global hypokinesis worse in the septum.. Left ventricular  ejection fraction, by estimation, is 45 to 50%. The left ventricle has  mildly decreased function.  The left ventricle demonstrates global  hypokinesis. The left ventricular internal cavity  size was mildly dilated. There is mild asymmetric left ventricular  hypertrophy. Left ventricular diastolic parameters are consistent with  Grade I diastolic dysfunction (impaired relaxation). Elevated left  ventricular end-diastolic pressure.  2. Right ventricular systolic function is normal. The right ventricular  size is normal. There is normal pulmonary artery systolic pressure.  3. Left atrial size was moderately dilated.  4. The mitral valve is normal in structure and function. Mild mitral  valve regurgitation. No evidence of mitral stenosis.  5. The aortic valve is tricuspid. Aortic valve regurgitation is trivial.  No aortic stenosis is present.  6. The inferior vena cava is normal in size with greater than 50%  respiratory variability, suggesting right atrial pressure of 3 mmHg  Patient Profile     79 y.o. female with a remote history of pulmonary embolism, recently diagnosed reduction in left ventricular ejection fraction, LBBB, history of hemochromatosis, remote history of asymptomatic stroke, essential hypertension presenting with left neck and shoulder discomfort and shortness of breath with hypoxemia.  Assessment & Plan    1. CHF: At least mildly depressed left ventricular systolic function (EF [id number] by echo, 29% by scintigraphy), improving after treatment with diuretics.  Longstanding problems with exertional dyspnea and some suspicion for chronic thromboembolic disease.    She appears to be currently euvolemic.  Scheduled for right and left heart catheterization today at 0900 hrs. with Dr. Ellouise Newer. This procedure has been fully reviewed with the patient and written informed consent has been obtained.  On beta-blockers and diuretics.  Start RAAS inhibitors after her catheterization.  Consider CRT if heart failure remains a problem after she is on optimize medical therapy. 2.  Abnormal  nuclear stress test :  Clarify whether or not she has CAD today.  The fixed septal defect may simply be an artifact related to LBBB. 3. Pleuritic chest pain: CT negative for pulmonary embolism.  Cannot exclude a small pulmonary infarction, smaller than the resolution of the CT angiogram.  She has not been on long-acting anticoagulants at home.  Currently on prophylactic dose LMWH. 4. Hx of Hemochromatosis: Consider outpatient cardiac MRI, but note that the patient's ferritin has been consistently low (range 17-76 over the last 3-4 years).  This would make cardiac involvement from hemochromatosis highly unlikely.     For questions or updates, please contact [place]in Please consult [email or web address] for contact info under        Signed, Sanda Klein, MD  07/12/2019,  8:36 AM

## 2019-07-13 LAB — CBC
HCT: 39.3 % (ref 36.0–46.0)
Hemoglobin: 12.6 g/dL (ref 12.0–15.0)
MCH: 27.9 pg (ref 26.0–34.0)
MCHC: 32.1 g/dL (ref 30.0–36.0)
MCV: 86.9 fL (ref 80.0–100.0)
Platelets: 244 10*3/uL (ref 150–400)
RBC: 4.52 MIL/uL (ref 3.87–5.11)
RDW: 13.7 % (ref 11.5–15.5)
WBC: 4.6 10*3/uL (ref 4.0–10.5)
nRBC: 0 % (ref 0.0–0.2)

## 2019-07-13 LAB — POCT I-STAT EG7
Acid-Base Excess: 12 mmol/L — ABNORMAL HIGH (ref 0.0–2.0)
Acid-Base Excess: 13 mmol/L — ABNORMAL HIGH (ref 0.0–2.0)
Bicarbonate: 39.6 mmol/L — ABNORMAL HIGH (ref 20.0–28.0)
Bicarbonate: 40.1 mmol/L — ABNORMAL HIGH (ref 20.0–28.0)
Calcium, Ion: 1.13 mmol/L — ABNORMAL LOW (ref 1.15–1.40)
Calcium, Ion: 1.23 mmol/L (ref 1.15–1.40)
HCT: 33 % — ABNORMAL LOW (ref 36.0–46.0)
HCT: 34 % — ABNORMAL LOW (ref 36.0–46.0)
Hemoglobin: 11.2 g/dL — ABNORMAL LOW (ref 12.0–15.0)
Hemoglobin: 11.6 g/dL — ABNORMAL LOW (ref 12.0–15.0)
O2 Saturation: 74 %
O2 Saturation: 77 %
Potassium: 3.4 mmol/L — ABNORMAL LOW (ref 3.5–5.1)
Potassium: 3.5 mmol/L (ref 3.5–5.1)
Sodium: 141 mmol/L (ref 135–145)
Sodium: 142 mmol/L (ref 135–145)
TCO2: 42 mmol/L — ABNORMAL HIGH (ref 22–32)
TCO2: 42 mmol/L — ABNORMAL HIGH (ref 22–32)
pCO2, Ven: 65.1 mmHg — ABNORMAL HIGH (ref 44.0–60.0)
pCO2, Ven: 65.1 mmHg — ABNORMAL HIGH (ref 44.0–60.0)
pH, Ven: 7.393 (ref 7.250–7.430)
pH, Ven: 7.397 (ref 7.250–7.430)
pO2, Ven: 41 mmHg (ref 32.0–45.0)
pO2, Ven: 44 mmHg (ref 32.0–45.0)

## 2019-07-13 LAB — BASIC METABOLIC PANEL
Anion gap: 15 (ref 5–15)
BUN: 31 mg/dL — ABNORMAL HIGH (ref 8–23)
CO2: 35 mmol/L — ABNORMAL HIGH (ref 22–32)
Calcium: 9.2 mg/dL (ref 8.9–10.3)
Chloride: 91 mmol/L — ABNORMAL LOW (ref 98–111)
Creatinine, Ser: 1.22 mg/dL — ABNORMAL HIGH (ref 0.44–1.00)
GFR calc Af Amer: 49 mL/min — ABNORMAL LOW (ref >60)
GFR calc non Af Amer: 42 mL/min — ABNORMAL LOW (ref >60)
Glucose, Bld: 91 mg/dL (ref 70–99)
Potassium: 3.4 mmol/L — ABNORMAL LOW (ref 3.5–5.1)
Sodium: 141 mmol/L (ref 135–145)

## 2019-07-13 LAB — POCT I-STAT 7, (LYTES, BLD GAS, ICA,H+H)
Acid-Base Excess: 14 mmol/L — ABNORMAL HIGH (ref 0.0–2.0)
Bicarbonate: 40.5 mmol/L — ABNORMAL HIGH (ref 20.0–28.0)
Calcium, Ion: 1.21 mmol/L (ref 1.15–1.40)
HCT: 34 % — ABNORMAL LOW (ref 36.0–46.0)
Hemoglobin: 11.6 g/dL — ABNORMAL LOW (ref 12.0–15.0)
O2 Saturation: 100 %
Potassium: 3.6 mmol/L (ref 3.5–5.1)
Sodium: 140 mmol/L (ref 135–145)
TCO2: 42 mmol/L — ABNORMAL HIGH (ref 22–32)
pCO2 arterial: 61.3 mmHg — ABNORMAL HIGH (ref 32.0–48.0)
pH, Arterial: 7.428 (ref 7.350–7.450)
pO2, Arterial: 208 mmHg — ABNORMAL HIGH (ref 83.0–108.0)

## 2019-07-13 LAB — MAGNESIUM: Magnesium: 1.7 mg/dL (ref 1.7–2.4)

## 2019-07-13 MED ORDER — ATORVASTATIN CALCIUM 40 MG PO TABS: 40.0000 mg | ORAL_TABLET | Freq: Every day | ORAL | 2 refills | Status: AC

## 2019-07-13 MED ORDER — METOPROLOL SUCCINATE ER 25 MG PO TB24: 25.0000 mg | ORAL_TABLET | Freq: Every day | ORAL | 3 refills | Status: DC

## 2019-07-13 MED ORDER — POTASSIUM CHLORIDE CRYS ER 20 MEQ PO TBCR
20.0000 meq | EXTENDED_RELEASE_TABLET | Freq: Once | ORAL | Status: AC
  Administered 2019-07-13: 20 meq via ORAL
  Filled 2019-07-13: qty 1

## 2019-07-13 MED ORDER — MAGNESIUM SULFATE IN D5W 1-5 GM/100ML-% IV SOLN
1.0000 g | Freq: Once | INTRAVENOUS | Status: AC
  Administered 2019-07-13: 1 g via INTRAVENOUS
  Filled 2019-07-13: qty 100

## 2019-07-13 MED ORDER — LOSARTAN POTASSIUM 50 MG PO TABS: 50.0000 mg | ORAL_TABLET | Freq: Every day | ORAL | 2 refills | Status: DC

## 2019-07-13 MED ORDER — FUROSEMIDE 80 MG PO TABS: 80.0000 mg | ORAL_TABLET | Freq: Every day | ORAL | 11 refills | Status: DC

## 2019-07-13 MED ORDER — LOSARTAN POTASSIUM 50 MG PO TABS
50.0000 mg | ORAL_TABLET | Freq: Every day | ORAL | Status: DC
  Administered 2019-07-13: 50 mg via ORAL
  Filled 2019-07-13: qty 1

## 2019-07-13 NOTE — Progress Notes (Signed)
  Mobility Specialist Criteria Algorithm Info.  SATURATION QUALIFICATIONS: (This note is used to comply with regulatory documentation for home oxygen)  Patient Saturations on Room Air at Rest =97%  Patient Saturations on Room Air while Ambulating = 95%  Patient Saturations on n/a Liters of oxygen while Ambulating = n/a%  Please briefly explain why patient needs home oxygen:  Mobility Team:   07/13/19 0900  Mobility  Activity Ambulated in hall;Dangled on edge of bed;Transferred:  Bed to chair (sitting EOB before ambulation, to chair after ambulation)  Range of Motion/Exercises Active;All extremities  Level of Assistance Modified independent, requires aide device or extra time (Min G)  Geographical information systems officer  Minutes Stood 6 minutes  Minutes Ambulated 4 minutes  Distance Ambulated (ft) 200 ft  Mobility Response Tolerated well (No complaints of SOB w/exertion)  Mobility performed by Mobility specialist  Bed Position Chair (Recliner Chair)   Pre-ambulation: HR 77, SPO2 97 During ambulation: HR 83 SPO2 94 Post-ambulation: HR 79, SPO2 95    07/13/2019 9:21 AM

## 2019-07-13 NOTE — Discharge Summary (Signed)
Physician Discharge Summary  [name] KDX:[id number] DOB: Oct 04, 1940 DOA: 07/09/2019  PCP: Shirline Frees, MD  Admit date: 07/09/2019 Discharge date: 07/13/2019  Time spent: 45  minutes  Recommendations for Outpatient Follow-up:  1. Follow-up cardiology on 08/02/2019 2. Check potassium level on 08/02/2019 3. Patient will need CT chest without contrast in 12 months to evaluate right lower lobe lung nodule.   Discharge Diagnoses:  Principal Problem:   CHF (congestive heart failure) (HCC) Active Problems:   History of DVT (deep vein thrombosis)   Benign essential HTN   RLS (restless legs syndrome)   Hypokalemia   Iron deficiency anemia due to chronic blood loss   Dyspnea   Respiratory failure with hypoxia (HCC)   Shoulder pain   Abnormal nuclear cardiac imaging test   Discharge Condition: Stable  Diet recommendation: Heart healthy diet  Filed Weights   07/11/19 0558 07/12/19 0400 07/13/19 0335  Weight: 81 kg 79.4 kg 78.5 kg    History of present illness:   HPI as per Dr. Loura Back is a 79 y.o.femalewith medical history significant forHx of DVT, PEpreviously on Xarelto (discontinued due to financial constraint),prothrombin II gene mutation,HTN, restless leg, iron deficiency anemia,hemachromatosisandGERDwho presented to [facility] for neck and shoulder blade pain.   About aweek ago she woke up with a "crick in my neck" and had trouble movingher neck and left upper extremity. She had sharp pain in her left shoulder blade as well as up her neck. She denies any extraneous activities or trauma. She then saw her PCP and was given Percocet which helped with the pain and muscle relaxer which she says was not helpful. She then presented to the ED today since the pain had migrated to her right shoulder and would have sharp pain each time she moved her right upper extremities. She has had bilateral shoulder surgery before. She denies any chest pain. She has been  having complaints of dyspnea but that has been ongoing for years and is worse with exertion.   Patient was evaluated at outpatient cardiology in February with a several year history of dyspnea on exertion. A 2D echo was obtain on 06/20/2019 and showed EF of 45-50% with mild global hypokinesia and mild asymmetric left ventricular hypertrophy with grade 1 diastolic dysfunction. A myoview stress test was obtained 3/12 to rule out ischemic etiology. It revealed severely decreased EF of <30% and small defect of mild severity present in the mid-anteroseptal and apical septal location. Given the decline in her heart function, cardiology at [facility] was consulted by ED physician at [facility] and it was recommended that she be transferred to [facility] and be admitted by medicine team with plans of heart cath.   Hospital Course:   Acute hypoxic respiratory failure-in setting of acute systolic heart failure.  CT chest negative for PE.  Echocardiogram done on 06/20/2019 showed EF 45 to 50%, global hypokinesis, grade 1 diastolic dysfunction.  Stress test done on 07/08/2019 showed small defect of mild severity in the mid anteroseptal and apical septal location.  EF 29%.  Cardiology was consulted.  Patient underwent cardiac catheterization on 07/12/2019, which showed minimal carotid atherosclerosis without significant stenosis and significant biventricular failure.  Mean pulmonary artery wedge pressure 24, mean right atrial pressure 30 mmHg.  Patient diuresed with IV Lasix.  Cardiology has adjusted her medications.  Patient is stable for discharge.  She will be discharged on furosemide 80 mg daily, losartan 50 mg daily, Toprol-XL 25 mg daily.  She  has an appointment to see cardiology on 08/02/2019.  CAD-patient has moderate coronary atherosclerosis, without significant stenosis.  Nuclear stress test shows artifactual septal defect due to LBBB.  Essential tremors-patient was taking propanolol prior to admission.  It has been  changed to Toprol-XL.  Bilateral shoulder pain-patient has intermittent bilateral musculoskeletal pain.  CT chest reveals bilateral glenohumeral joint arthritic changes.  Continue Percocet as needed.  CKD stage III-creatinine at baseline. Hypokalemia-we will replace potassium today 20 mEq p.o. x1 before discharge.  Admission was 1.7, will give 1 g of magnesium sulfate IV before discharge  Incidental finding of RLL lung nodule-2 to 3 mm nodule in the right lower lobe which was not evident on prior study.  Noncontrast CT chest can be considered in 12 months as outpatient.  Iron deficiency anemia-patient receives IV iron with hematology as outpatient.  Hemoglobin has been stable.  History of DVT/PE-patient takes aspirin 162 mg daily.  Xarelto was stopped due to financial constraints in the past.  Hypertension-continue Toprol-XL, losartan.  Restless leg syndrome-continue Restoril, Mirapex.   Procedures:  Cardiac catheterization  Consultations:  Cardiology  Discharge Exam: Vitals:   07/12/19 2052 07/13/19 0335  BP: (!) 154/84 109/65  Pulse: 68 65  Resp: 19 17  Temp: 98.1 F (36.7 C) (!) 97.5 F (36.4 C)  SpO2: 93% 92%    General: Appears in no acute distress Cardiovascular: S1-S2, regular Respiratory: Clear to auscultation bilaterally  Discharge Instructions   Discharge Instructions    Diet - low sodium heart healthy   Complete by: As directed    Increase activity slowly   Complete by: As directed      Allergies as of 07/13/2019      Reactions   Morphine And Related Other (See Comments)   LARGER DOSES OF MORPHINE CAUSES BODY TWITCHING   Penicillins Anaphylaxis   Childhood reaction Has patient had a PCN reaction causing immediate rash, facial/tongue/throat swelling, SOB or lightheadedness with hypotension: Yes Has patient had a PCN reaction causing severe rash involving mucus membranes or skin necrosis: Unknown Has patient had a PCN reaction that required  hospitalization: No Has patient had a PCN reaction occurring within the last 10 years: no (5 or 79 yrs old) If all of the above answers are "NO", then may proceed with Cephalosporin use.   Sulfa Antibiotics Other (See Comments)   Unknown childhood reaction      Medication List    STOP taking these medications   acetaminophen 500 MG tablet Commonly known as: TYLENOL   amLODipine 10 MG tablet Commonly known as: NORVASC   hydrochlorothiazide 25 MG tablet Commonly known as: HYDRODIURIL   propranolol 10 MG tablet Commonly known as: INDERAL     TAKE these medications   ARTIFICIAL TEARS OP Place 1 drop into both eyes 3 (three) times daily as needed (dry eyes).   aspirin EC 81 MG tablet Take 162 mg by mouth daily.   atorvastatin 40 MG tablet Commonly known as: LIPITOR Take 1 tablet (40 mg total) by mouth daily at 6 PM.   CALCIUM 600+D PO Take 1 tablet by mouth 2 (two) times daily.   citalopram 20 MG tablet Commonly known as: CELEXA Take 20 mg by mouth at bedtime.   colchicine 0.6 MG tablet Take 1 tablet (0.6 mg total) by mouth daily. Gout flare up. What changed:   when to take this  reasons to take this  additional instructions   cycloSPORINE 0.05 % ophthalmic emulsion Commonly known as: RESTASIS Place 1  drop into both eyes 2 (two) times daily.   FiberCon 625 MG tablet Generic drug: polycarbophil Take 625 mg by mouth in the morning and at bedtime.   fish oil-omega-3 fatty acids 1000 MG capsule Take 1 g by mouth daily.   furosemide 80 MG tablet Commonly known as: Lasix Take 1 tablet (80 mg total) by mouth daily.   losartan 50 MG tablet Commonly known as: COZAAR Take 1 tablet (50 mg total) by mouth daily.   metoprolol succinate 25 MG 24 hr tablet Commonly known as: TOPROL-XL Take 1 tablet (25 mg total) by mouth daily. Start taking on: July 14, 2019   multivitamin with minerals Tabs tablet Take 1 tablet by mouth daily. Centrum Silver   omeprazole 20  MG capsule Commonly known as: PRILOSEC Take 20 mg by mouth daily.   oxyCODONE-acetaminophen 5-325 MG tablet Commonly known as: PERCOCET/ROXICET Take 1 tablet by mouth every 6 (six) hours as needed (pain).   pramipexole 0.5 MG tablet Commonly known as: MIRAPEX Take 0.5 mg by mouth at bedtime.   temazepam 15 MG capsule Commonly known as: RESTORIL Take 1 capsule (15 mg total) by mouth at bedtime.   tiZANidine 4 MG tablet Commonly known as: ZANAFLEX Take 4 mg by mouth at bedtime as needed for muscle spasms.   Turmeric Curcumin 500 MG Caps Take 500 mg by mouth at bedtime.   Vitamin D 50 MCG (2000 UT) tablet Take 2,000 Units by mouth 2 (two) times daily.   VITAMIN E PO Take 1 capsule by mouth at bedtime.      Allergies  Allergen Reactions  . Morphine And Related Other (See Comments)    LARGER DOSES OF MORPHINE CAUSES BODY TWITCHING  . Penicillins Anaphylaxis    Childhood reaction Has patient had a PCN reaction causing immediate rash, facial/tongue/throat swelling, SOB or lightheadedness with hypotension: Yes Has patient had a PCN reaction causing severe rash involving mucus membranes or skin necrosis: Unknown Has patient had a PCN reaction that required hospitalization: No Has patient had a PCN reaction occurring within the last 10 years: no (5 or 79 yrs old) If all of the above answers are "NO", then may proceed with Cephalosporin use.   . Sulfa Antibiotics Other (See Comments)    Unknown childhood reaction      The results of significant diagnostics from this hospitalization (including imaging, microbiology, ancillary and laboratory) are listed below for reference.    Significant Diagnostic Studies: CT Angio Chest PE W and/or Wo Contrast  Result Date: 07/09/2019 CLINICAL DATA:  Left shoulder pain for 1 week. Right shoulder began hurting last night. Short of breath on exertion. EXAM: CT ANGIOGRAPHY CHEST WITH CONTRAST TECHNIQUE: Multidetector CT imaging of the chest  was performed using the standard protocol during bolus administration of intravenous contrast. Multiplanar CT image reconstructions and MIPs were obtained to evaluate the vascular anatomy. CONTRAST:  194m OMNIPAQUE IOHEXOL 350 MG/ML SOLN COMPARISON:  CTA, 02/21/2017 FINDINGS: Cardiovascular: There is satisfactory opacification of the pulmonary arteries to the segmental level. There is no evidence of a pulmonary embolism. Heart is normal in size and configuration. No pericardial effusion. Mild left and right coronary artery calcifications. Great vessels are normal in caliber. No aortic dissection. Aortic atherosclerosis. Mediastinum/Nodes: No neck base, axillary, mediastinal or hilar masses or enlarged lymph nodes. Moderate hiatal hernia. Esophagus is unremarkable. Normal trachea. Lungs/Pleura: Small area of ill-defined nodular opacities in the medial right middle lobe adjacent to the minor fissure, new since the prior CT. 2-3 mm  nodule, posterior right lower lobe, image 61, series 5, not evident on the prior CT. Minor atelectasis at the bases. Remainder of the lungs is clear. No pleural effusion or pneumothorax. Upper Abdomen: No acute abnormality. Musculoskeletal: No fracture or acute finding. No osteoblastic or osteolytic lesions. Glenohumeral joints show significant narrowing, subchondral cystic change and sclerosis and marginal osteophytes. Review of the MIP images confirms the above findings. IMPRESSION: 1. No evidence of a pulmonary embolism. No convincing acute finding. 2. Small area of ill-defined nodular opacities in the medial right middle lobe near the minor fissure. This is likely inflammatory in etiology, and may be chronic but new since the prior exam. 3. 2-3 mm nodule, right lower lobe, not evident on the prior study. No follow-up needed if patient is low-risk. Non-contrast chest CT can be considered in 12 months if patient is high-risk. This recommendation follows the consensus statement: Guidelines  for Management of Incidental Pulmonary Nodules Detected on CT Images: From the Fleischner Society 2017; Radiology 2017; 284:228-243. 4. Bilateral glenohumeral joint arthropathic changes likely the source of this patient's shoulder pain. Aortic Atherosclerosis (ICD10-I70.0). Electronically Signed   By: Lajean Manes M.D.   On: 07/09/2019 12:49   CARDIAC CATHETERIZATION  Result Date: 07/12/2019  Prox LAD lesion is 45% stenosed.  Mild coronary obstructive disease with a smooth area of 40 to 50% proximal LAD stenosis in the region of a large first diagonal vessel.  The remainder of the coronary arteries are angiographically normal including a large ramus intermediate vessel, left circumflex vessel, and dominant RCA. Mild right heart pressure elevation with PA systolic pressure at 37 and mean pressure 25 mmHg. LVEDP: 25 mmHg RECOMMENDATION: Medical therapy for mild CAD involving her proximal LAD with guideline directed medical therapy for LV dysfunction.   DG Chest Port 1 View  Result Date: 07/09/2019 CLINICAL DATA:  Shortness of breath EXAM: PORTABLE CHEST 1 VIEW COMPARISON:  Chest CT is pending.  Chest x-ray 02/21/2017 FINDINGS: Cardiomegaly and lower mediastinal widening from hiatal hernia. The trachea is deviated to the right but this is likely accentuated by low volumes when compared with 2018. Lobulation of the right diaphragm. There is no edema, consolidation, effusion, or pneumothorax. IMPRESSION: 1. Cardiomegaly without pulmonary edema. 2. Hiatal hernia. Electronically Signed   By: Monte Fantasia M.D.   On: 07/09/2019 11:47   MYOCARDIAL PERFUSION IMAGING  Result Date: 07/08/2019  The left ventricular ejection fraction is severely decreased (<30%).  Nuclear stress EF: 29%.  There was no ST segment deviation noted during stress.  There is a small defect of mild severity present in the mid anteroseptal and apical septal location. The defect is non-reversible. This may be related to underlying  LBBB. No ischemia noted.  This is a high risk study due to LV dysfunction.    ECHOCARDIOGRAM COMPLETE  Result Date: 06/20/2019    ECHOCARDIOGRAM REPORT   Patient Name:   TAVONNA WORTHINGTON Date of Exam: 06/20/2019 Medical Rec #:  [id number]       Height:       62.5 in Accession #:    [id number]      Weight:       188.0 lb Date of Birth:  02-15-1941        BSA:          1.873 m Patient Age:    2 years        BP:           157/96 mmHg Patient Gender: F  HR:           62 bpm. Exam Location:  [place] Procedure: 2D Echo, 3D Echo, Cardiac Doppler and Color Doppler Indications:    R06.00 Dyspnea  History:        Patient has no prior history of Echocardiogram examinations.                 Arrythmias:LBBB, Signs/Symptoms:Edema; Risk                 Factors:Hypertension.  Sonographer:    Marygrace Drought RCS Referring Phys: [place]  1. Mild global hypokinesis worse in the septum.. Left ventricular ejection fraction, by estimation, is 45 to 50%. The left ventricle has mildly decreased function. The left ventricle demonstrates global hypokinesis. The left ventricular internal cavity size was mildly dilated. There is mild asymmetric left ventricular hypertrophy. Left ventricular diastolic parameters are consistent with Grade I diastolic dysfunction (impaired relaxation). Elevated left ventricular end-diastolic pressure.  2. Right ventricular systolic function is normal. The right ventricular size is normal. There is normal pulmonary artery systolic pressure.  3. Left atrial size was moderately dilated.  4. The mitral valve is normal in structure and function. Mild mitral valve regurgitation. No evidence of mitral stenosis.  5. The aortic valve is tricuspid. Aortic valve regurgitation is trivial. No aortic stenosis is present.  6. The inferior vena cava is normal in size with greater than 50% respiratory variability, suggesting right atrial pressure of 3 mmHg. FINDINGS  Left Ventricle:  Mild global hypokinesis worse in the septum. Left ventricular ejection fraction, by estimation, is 45 to 50%. The left ventricle has mildly decreased function. The left ventricle demonstrates global hypokinesis. The left ventricular internal cavity size was mildly dilated. There is mild asymmetric left ventricular hypertrophy. Left ventricular diastolic parameters are consistent with Grade I diastolic dysfunction (impaired relaxation). Elevated left ventricular end-diastolic pressure.  LV Wall Scoring: There is diffuse hypokinesis. Right Ventricle: The right ventricular size is normal. No increase in right ventricular wall thickness. Right ventricular systolic function is normal. There is normal pulmonary artery systolic pressure. The tricuspid regurgitant velocity is 2.21 m/s, and  with an assumed right atrial pressure of 3 mmHg, the estimated right ventricular systolic pressure is 05.6 mmHg. Left Atrium: Left atrial size was moderately dilated. Right Atrium: Right atrial size was normal in size. Pericardium: There is no evidence of pericardial effusion. Mitral Valve: The mitral valve is normal in structure and function. Normal mobility of the mitral valve leaflets. Mild mitral valve regurgitation. No evidence of mitral valve stenosis. Tricuspid Valve: The tricuspid valve is normal in structure. Tricuspid valve regurgitation is trivial. No evidence of tricuspid stenosis. Aortic Valve: The aortic valve is tricuspid. Aortic valve regurgitation is trivial. No aortic stenosis is present. Pulmonic Valve: The pulmonic valve was normal in structure. Pulmonic valve regurgitation is not visualized. No evidence of pulmonic stenosis. Aorta: The aortic root is normal in size and structure. Venous: The inferior vena cava is normal in size with greater than 50% respiratory variability, suggesting right atrial pressure of 3 mmHg. IAS/Shunts: No atrial level shunt detected by color flow Doppler.  LEFT VENTRICLE PLAX 2D LVIDd:          5.74 cm  Diastology LVIDs:         4.08 cm  LV e' lateral:   9.46 cm/s LV PW:         1.30 cm  LV E/e' lateral: 9.0 LV IVS:  0.80 cm  LV e' medial:    5.77 cm/s LVOT diam:     2.00 cm  LV E/e' medial:  14.8 LV SV:         65.03 ml LV SV Index:   34.72 LVOT Area:     3.14 cm                          3D Volume EF:                         3D EF:        53 %                         LV EDV:       157 ml                         LV ESV:       74 ml                         LV SV:        83 ml RIGHT VENTRICLE RV Basal diam:  2.96 cm RV S prime:     23.80 cm/s TAPSE (M-mode): 2.7 cm RVSP:           22.5 mmHg LEFT ATRIUM             Index       RIGHT ATRIUM           Index LA diam:        3.80 cm 2.03 cm/m  RA Pressure: 3.00 mmHg LA Vol (A2C):   63.0 ml 33.63 ml/m RA Area:     17.20 cm LA Vol (A4C):   75.2 ml 40.15 ml/m RA Volume:   44.30 ml  23.65 ml/m LA Biplane Vol: 70.3 ml 37.53 ml/m  AORTIC VALVE LVOT Vmax:   83.50 cm/s LVOT Vmean:  60.900 cm/s LVOT VTI:    0.207 m  AORTA Ao Root diam: 2.50 cm MITRAL VALVE                 TRICUSPID VALVE MV Area (PHT):               TR Peak grad:   19.5 mmHg MV Decel Time:               TR Vmax:        221.00 cm/s MR Peak grad:    88.0 mmHg   Estimated RAP:  3.00 mmHg MR Mean grad:    57.0 mmHg   RVSP:           22.5 mmHg MR Vmax:         469.00 cm/s MR Vmean:        359.0 cm/s  SHUNTS MR PISA:         2.26 cm    Systemic VTI:  0.21 m MR PISA Eff ROA: 15 mm      Systemic Diam: 2.00 cm MR PISA Radius:  0.60 cm MV E velocity: 85.40 cm/s MV A velocity: 110.00 cm/s MV E/A ratio:  0.78 Skeet Latch MD Electronically signed by Skeet Latch MD Signature Date/Time: 06/20/2019/4:40:22 PM    Final     Microbiology: Recent Results (from the past 240 hour(s))  SARS Coronavirus 2 Ag (30 min  TAT) - Nasal Swab (BD Veritor Kit)     Status: None   Collection Time: 07/09/19  1:27 PM   Specimen: Nasal Swab (BD Veritor Kit)  Result Value Ref Range Status   SARS Coronavirus 2  Ag NEGATIVE NEGATIVE Final    Comment: (NOTE) SARS-CoV-2 antigen NOT DETECTED.  Negative results are presumptive.  Negative results do not preclude SARS-CoV-2 infection and should not be used as the sole basis for treatment or other patient management decisions, including infection  control decisions, particularly in the presence of clinical signs and  symptoms consistent with COVID-19, or in those who have been in contact with the virus.  Negative results must be combined with clinical observations, patient history, and epidemiological information. The expected result is Negative. Fact Sheet for Patients: PodPark.tn Fact Sheet for Healthcare Providers: GiftContent.is This test is not yet approved or cleared by the [place] FDA and  has been authorized for detection and/or diagnosis of SARS-CoV-2 by FDA under an Emergency Use Authorization (EUA).  This EUA will remain in effect (meaning this test can be used) for the duration of  the COVID-19 de claration under Section 564(b)(1) of the Act, 21 U.S.C. section 360bbb-3(b)(1), unless the authorization is terminated or revoked sooner. Performed at [facility], [address] [place], [place] [address]   Respiratory Panel by RT PCR (Flu A&B, Covid) - Nasal Swab (BD Veritor Kit)     Status: None   Collection Time: 07/09/19  1:27 PM   Specimen: Nasal Swab (BD Veritor Kit)  Result Value Ref Range Status   SARS Coronavirus 2 by RT PCR NEGATIVE NEGATIVE Final    Comment: (NOTE) SARS-CoV-2 target nucleic acids are NOT DETECTED. The SARS-CoV-2 RNA is generally detectable in upper respiratoy specimens during the acute phase of infection. The lowest concentration of SARS-CoV-2 viral copies this assay can detect is 131 copies/mL. A negative result does not preclude SARS-Cov-2 infection and should not be used as the sole basis for treatment or other patient management  decisions. A negative result may occur with  improper specimen collection/handling, submission of specimen other than nasopharyngeal swab, presence of viral mutation(s) within the areas targeted by this assay, and inadequate number of viral copies (<131 copies/mL). A negative result must be combined with clinical observations, patient history, and epidemiological information. The expected result is Negative. Fact Sheet for Patients:  PinkCheek.be Fact Sheet for Healthcare Providers:  GravelBags.it This test is not yet ap proved or cleared by the [place] FDA and  has been authorized for detection and/or diagnosis of SARS-CoV-2 by FDA under an Emergency Use Authorization (EUA). This EUA will remain  in effect (meaning this test can be used) for the duration of the COVID-19 declaration under Section 564(b)(1) of the Act, 21 U.S.C. section 360bbb-3(b)(1), unless the authorization is terminated or revoked sooner.    Influenza A by PCR NEGATIVE NEGATIVE Final   Influenza B by PCR NEGATIVE NEGATIVE Final    Comment: (NOTE) The Xpert Xpress SARS-CoV-2/FLU/RSV assay is intended as an aid in  the diagnosis of influenza from Nasopharyngeal swab specimens and  should not be used as a sole basis for treatment. Nasal washings and  aspirates are unacceptable for Xpert Xpress SARS-CoV-2/FLU/RSV  testing. Fact Sheet for Patients: PinkCheek.be Fact Sheet for Healthcare Providers: GravelBags.it This test is not yet approved or cleared by the [place] FDA and  has been authorized for detection and/or diagnosis of SARS-CoV-2 by  FDA under an Emergency Use  Authorization (EUA). This EUA will remain  in effect (meaning this test can be used) for the duration of the  Covid-19 declaration under Section 564(b)(1) of the Act, 21  U.S.C. section 360bbb-3(b)(1), unless the authorization  is  terminated or revoked. Performed at [facility], [address] [place], [place] [address]      Labs: Basic Metabolic Panel: Recent Labs  Lab 07/09/19 1120 07/10/19 0525 07/11/19 0528 07/12/19 0321 07/13/19 0553  NA 135 136 143 141 141  K 3.2* 3.4* 3.4* 3.8 3.4*  CL 96* 98 95* 93* 91*  CO2 27 26 33* 34* 35*  GLUCOSE 90 196* 84 89 91  BUN 21 24* 30* 37* 31*  CREATININE 0.97 1.06* 1.06* 1.26* 1.22*  CALCIUM 8.8* 8.9 9.3 9.5 9.2  MG  --   --  1.6* 1.8 1.7   Liver Function Tests: No results for input(s): AST, ALT, ALKPHOS, BILITOT, PROT, ALBUMIN in the last 168 hours. No results for input(s): LIPASE, AMYLASE in the last 168 hours. No results for input(s): AMMONIA in the last 168 hours. CBC: Recent Labs  Lab 07/09/19 1120 07/10/19 0525 07/11/19 0528 07/12/19 1125 07/13/19 0553  WBC 6.5 4.9 7.5 5.4 4.6  NEUTROABS 4.3  --   --   --   --   HGB 11.4* 11.2* 10.8* 12.7 12.6  HCT 34.6* 34.2* 33.6* 39.3 39.3  MCV 86.9 84.9 87.5 87.9 86.9  PLT 206 196 210 254 244   Cardiac Enzymes: No results for input(s): CKTOTAL, CKMB, CKMBINDEX, TROPONINI in the last 168 hours. BNP: BNP (last 3 results) Recent Labs    07/09/19 1120  BNP 326.5*     Signed:  Oswald Hillock MD.  [facility] 07/13/2019, 11:09 AM

## 2019-07-13 NOTE — Progress Notes (Addendum)
Progress Note  Patient Name: [name] Date of Encounter: 07/13/2019  Primary Cardiologist: No primary care provider on file. [place]  Subjective   Breathing much better. Reviewed the Cath Lab findings.  She has minimal carotid atherosclerosis without significant stenoses and has significant biventricular failure (mean pulmonary artery wedge pressure 24, mean right atrial pressure 13 mmHg) even after 5 L net diuresis.  Weight is down 7 pounds from admission at 173 pounds.  Inpatient Medications    Scheduled Meds: . amLODipine  10 mg Oral Daily  . aspirin  81 mg Oral Daily  . atorvastatin  40 mg Oral q1800  . calcium-vitamin D  1 tablet Oral BID  . cholecalciferol  2,000 Units Oral BID  . citalopram  20 mg Oral QHS  . cycloSPORINE  1 drop Both Eyes BID  . enoxaparin (LOVENOX) injection  40 mg Subcutaneous Q24H  . furosemide  40 mg Intravenous BID  . metoprolol succinate  25 mg Oral Daily  . pantoprazole  40 mg Oral Daily  . polycarbophil  625 mg Oral Daily  . potassium chloride  20 mEq Oral Once  . pramipexole  0.5 mg Oral QHS  . sodium chloride flush  3 mL Intravenous Q12H  . sodium chloride flush  3 mL Intravenous Q12H  . temazepam  15 mg Oral QHS   Continuous Infusions: . sodium chloride    . magnesium sulfate bolus IVPB     PRN Meds: sodium chloride, acetaminophen, diazepam, ondansetron (ZOFRAN) IV, oxyCODONE-acetaminophen, sodium chloride flush   Vital Signs    Vitals:   07/12/19 1055 07/12/19 2045 07/12/19 2052 07/13/19 0335  BP: 130/78 (!) 153/106 (!) 154/84 109/65  Pulse:  74 68 65  Resp:   19 17  Temp:  97.9 F (36.6 C) 98.1 F (36.7 C) (!) 97.5 F (36.4 C)  TempSrc:  Oral Oral Oral  SpO2: 92% 95% 93% 92%  Weight:    78.5 kg  Height:        Intake/Output Summary (Last 24 hours) at 07/13/2019 0913 Last data filed at 07/13/2019 0823 Gross per 24 hour  Intake 2337.12 ml  Output 2650 ml  Net -312.88 ml   Last 3 Weights 07/13/2019 07/12/2019  07/11/2019  Weight (lbs) 173 lb 175 lb 178 lb 8 oz  Weight (kg) 78.472 kg 79.379 kg 80.967 kg      Telemetry    NSR - Personally Reviewed  ECG    NSR, narrower QRS- Personally Reviewed  Physical Exam  Looks comfortable GEN: No acute distress.   Neck:  5-6 cm JVD even when standing up Cardiac: RRR, paradoxically split S2, no murmurs, rubs, or gallops.  Respiratory: Clear to auscultation bilaterally. GI: Soft, nontender, non-distended  MS: No edema; No deformity. Neuro:  Nonfocal  Psych: Normal affect   Labs    High Sensitivity Troponin:   Recent Labs  Lab 07/09/19 1120 07/09/19 1327 07/09/19 2010 07/09/19 2046  TROPONINIHS 7 8 15 16       Chemistry Recent Labs  Lab 07/11/19 0528 07/12/19 0321 07/13/19 0553  NA 143 141 141  K 3.4* 3.8 3.4*  CL 95* 93* 91*  CO2 33* 34* 35*  GLUCOSE 84 89 91  BUN 30* 37* 31*  CREATININE 1.06* 1.26* 1.22*  CALCIUM 9.3 9.5 9.2  GFRNONAA 50* 40* 42*  GFRAA 58* 47* 49*  ANIONGAP 15 14 15      Hematology Recent Labs  Lab 07/11/19 0528 07/12/19 1125 07/13/19 0553  WBC 7.5 5.4  4.6  RBC 3.84* 4.47 4.52  HGB 10.8* 12.7 12.6  HCT 33.6* 39.3 39.3  MCV 87.5 87.9 86.9  MCH 28.1 28.4 27.9  MCHC 32.1 32.3 32.1  RDW 14.1 14.0 13.7  PLT 210 254 244    BNP Recent Labs  Lab 07/09/19 1120  BNP 326.5*     DDimer No results for input(s): DDIMER in the last 168 hours.   Radiology    CARDIAC CATHETERIZATION  Result Date: 07/12/2019  Prox LAD lesion is 45% stenosed.  Mild coronary obstructive disease with a smooth area of 40 to 50% proximal LAD stenosis in the region of a large first diagonal vessel.  The remainder of the coronary arteries are angiographically normal including a large ramus intermediate vessel, left circumflex vessel, and dominant RCA. Mild right heart pressure elevation with PA systolic pressure at 37 and mean pressure 25 mmHg. LVEDP: 25 mmHg RECOMMENDATION: Medical therapy for mild CAD involving her proximal  LAD with guideline directed medical therapy for LV dysfunction.    Cardiac Studies   Nuclear stress test 07/08/2019  The left ventricular ejection fraction is severely decreased (<30%).  Nuclear stress EF: 29%.  There was no ST segment deviation noted during stress.  There is a small defect of mild severity present in the mid anteroseptal and apical septal location. The defect is non-reversible. This may be related to underlying LBBB. No ischemia noted.  This is a high risk study due to LV dysfunction.  Echo 06/20/2019 1. Mild global hypokinesis worse in the septum.. Left ventricular  ejection fraction, by estimation, is 45 to 50%. The left ventricle has  mildly decreased function. The left ventricle demonstrates global  hypokinesis. The left ventricular internal cavity  size was mildly dilated. There is mild asymmetric left ventricular  hypertrophy. Left ventricular diastolic parameters are consistent with  Grade I diastolic dysfunction (impaired relaxation). Elevated left  ventricular end-diastolic pressure.  2. Right ventricular systolic function is normal. The right ventricular  size is normal. There is normal pulmonary artery systolic pressure.  3. Left atrial size was moderately dilated.  4. The mitral valve is normal in structure and function. Mild mitral  valve regurgitation. No evidence of mitral stenosis.  5. The aortic valve is tricuspid. Aortic valve regurgitation is trivial.  No aortic stenosis is present.  6. The inferior vena cava is normal in size with greater than 50%  respiratory variability, suggesting right atrial pressure of 3 mmHg  Cardiac catheterization 07/12/2019 Fick Cardiac Output 6.95 L/min  Fick Cardiac Output Index 3.77 (L/min)/BSA  RA A Wave 13 mmHg  RA V Wave 11 mmHg  RA Mean 10 mmHg  RV Systolic Pressure 34 mmHg  RV Diastolic Pressure 10 mmHg  RV EDP 15 mmHg  PA Systolic Pressure 37 mmHg  PA Diastolic Pressure 15 mmHg  PA Mean 25  mmHg  PW A Wave 35 mmHg  PW V Wave 25 mmHg  PW Mean 24 mmHg  AO Systolic Pressure [id number] mmHg  AO Diastolic Pressure 58 mmHg  AO Mean 86 mmHg  LV Systolic Pressure [id number] mmHg  LV Diastolic Pressure 12 mmHg  LV EDP 25 mmHg  AOp Systolic Pressure 123456 mmHg  AOp Diastolic Pressure 58 mmHg  AOp Mean Pressure 90 mmHg  LVp Systolic Pressure 123456 mmHg  LVp Diastolic Pressure 18 mmHg  LVp EDP Pressure 32 mmHg  QP/QS 1  TPVR Index 6.63 HRUI  TSVR Index 22.8 HRUI  PVR SVR Ratio 0.01  TPVR/TSVR Ratio 0.29  Patient Profile     79 y.o. female a remote history of pulmonary embolism, recently diagnosed reduction in left ventricular ejection fraction, LBBB, history of hemochromatosis, remote history of asymptomatic stroke, essential hypertension presenting with left neck and shoulder discomfort and shortness of breath with hypoxemia, found to have significant biventricular heart failure, pulmonary hypertension secondary to left heart failure, nonobstructive coronary artery disease.  Assessment & Plan    1. CHF:She was still "wet" with elevated PAWP of 24 mmHg yesterday.  Roughly 400 mL net diuresis since then.  Suspect she needs to lose another 1.5-2 L of fluid gradually.  We will set a "dry weight" target of 170 pounds or less.  We will use metoprolol and losartan and furosemide, (rather than amlodipine and propranolol and hydrochlorothiazide).  Gradually titrate the doses of medications and consider transition to [facility] as an outpatient.  After 3 months on optimal medical therapy, would repeat an echocardiogram.  If EF is less than 40% she would benefit from CRT-P. At least mildly depressed left ventricular systolic function (EF [id number] by echo, 29% by scintigraphy).    2.CAD:  Only has very moderate coronary atherosclerosis without significant stenoses.  Her nuclear stress test shows an artifactual septal defect due to LBBB. 3. LBBB: Appears to be rate related.  QRS is narrow or on today's tracing.   However, on prior ECGs QRS duration was over 160 ms with typical LBBB pattern.  (Multiple predictors of good response to CRT include female gender, nonischemic cardiomyopathy, typical LBBB and QRS over 150 ms). 4. Pleuritic chest pain:Resolved.  CT negative for pulmonary embolism. Cannot exclude a small pulmonary infarction, smaller than the resolution of the CT angiogram. She has not been on long-acting anticoagulants at home. I do not think a VQ scan would add to the diagnostic work-up.  Her pulmonary artery hypertension is explained entirely by the left heart failure. 5. Hx of Hemochromatosis:Consider outpatient cardiac MRI,but note that the patient's ferritin has been consistently low (range 17-76 over the last3-4years). This would make cardiac involvement from hemochromatosis highly unlikely.  [facility] will sign off.   Medication Recommendations:  - Stop amlodipine, start losartan 50 mg daily and titrate up towards, consider transition to [facility] as an outpatient.   -Stop propranolol.  Continue metoprolol succinate 25 mg once daily and titrate up as tolerated.  -Stop hydrochlorothiazide.  Start furosemide 80 mg once daily and adjust dose to achieve "dry weight " of 170 pounds or less Other recommendations (labs, testing, etc): [facility]met on 08/02/2019 Follow up as an outpatient: Has an appointment with Dr. Quay Burow on 08/02/2019  For questions or updates, please contact [facility] Please consult [email or web address] for contact info under        Signed, Sanda Klein, MD  07/13/2019, 9:13 AM

## 2019-07-19 DIAGNOSIS — M19041 Primary osteoarthritis, right hand: Secondary | ICD-10-CM | POA: Diagnosis not present

## 2019-07-19 DIAGNOSIS — N183 Chronic kidney disease, stage 3 unspecified: Secondary | ICD-10-CM | POA: Diagnosis not present

## 2019-07-19 DIAGNOSIS — D508 Other iron deficiency anemias: Secondary | ICD-10-CM | POA: Diagnosis not present

## 2019-07-19 DIAGNOSIS — N159 Renal tubulo-interstitial disease, unspecified: Secondary | ICD-10-CM | POA: Diagnosis not present

## 2019-07-19 DIAGNOSIS — M8580 Other specified disorders of bone density and structure, unspecified site: Secondary | ICD-10-CM | POA: Diagnosis not present

## 2019-07-19 DIAGNOSIS — E7800 Pure hypercholesterolemia, unspecified: Secondary | ICD-10-CM | POA: Diagnosis not present

## 2019-07-19 DIAGNOSIS — F329 Major depressive disorder, single episode, unspecified: Secondary | ICD-10-CM | POA: Diagnosis not present

## 2019-07-19 DIAGNOSIS — M1990 Unspecified osteoarthritis, unspecified site: Secondary | ICD-10-CM | POA: Diagnosis not present

## 2019-07-19 DIAGNOSIS — I10 Essential (primary) hypertension: Secondary | ICD-10-CM | POA: Diagnosis not present

## 2019-08-02 ENCOUNTER — Ambulatory Visit: Payer: PPO | Admitting: Cardiovascular Disease

## 2019-08-03 ENCOUNTER — Telehealth: Payer: Self-pay | Admitting: Cardiovascular Disease

## 2019-08-03 NOTE — Telephone Encounter (Signed)
   Pt said while she was in the hospital last 07/09/19 she was advised not to take tylenol and doesn't know why. She got her 2nd covid vaccine yesterday and running a fever today and would like to know if its ok for her to take tylenol, if not what is the next alternative.  Please advise

## 2019-08-03 NOTE — Telephone Encounter (Signed)
I spoke with patient who reports she was told prior to discharge she should not take tylenol. Med list reviewed and patient is on Oxycodone-acetaminophen. Pt reports she has not been taking this. I explained to patient this medication had acetaminophen in it and as long as she was not taking it she could take tylenol/acetaminophen.  I told her just to make sure she did not take oxycodone-acetaminophen with tylenol/acetaminophen

## 2019-08-10 ENCOUNTER — Emergency Department (HOSPITAL_COMMUNITY): Payer: PPO

## 2019-08-10 ENCOUNTER — Emergency Department (HOSPITAL_COMMUNITY)
Admission: EM | Admit: 2019-08-10 | Discharge: 2019-08-10 | Disposition: A | Discharge status: Home / Self Care | Payer: PPO | Attending: Emergency Medicine | Admitting: Emergency Medicine

## 2019-08-10 ENCOUNTER — Other Ambulatory Visit: Payer: Self-pay

## 2019-08-10 ENCOUNTER — Encounter (HOSPITAL_COMMUNITY): Payer: Self-pay

## 2019-08-10 DIAGNOSIS — I10 Essential (primary) hypertension: Secondary | ICD-10-CM | POA: Diagnosis not present

## 2019-08-10 DIAGNOSIS — I110 Hypertensive heart disease with heart failure: Secondary | ICD-10-CM | POA: Insufficient documentation

## 2019-08-10 DIAGNOSIS — Z79899 Other long term (current) drug therapy: Secondary | ICD-10-CM | POA: Insufficient documentation

## 2019-08-10 DIAGNOSIS — R109 Unspecified abdominal pain: Secondary | ICD-10-CM

## 2019-08-10 DIAGNOSIS — Z86718 Personal history of other venous thrombosis and embolism: Secondary | ICD-10-CM | POA: Insufficient documentation

## 2019-08-10 DIAGNOSIS — D6852 Prothrombin gene mutation: Secondary | ICD-10-CM | POA: Diagnosis not present

## 2019-08-10 DIAGNOSIS — I2510 Atherosclerotic heart disease of native coronary artery without angina pectoris: Secondary | ICD-10-CM | POA: Insufficient documentation

## 2019-08-10 DIAGNOSIS — R079 Chest pain, unspecified: Secondary | ICD-10-CM | POA: Diagnosis not present

## 2019-08-10 DIAGNOSIS — I509 Heart failure, unspecified: Secondary | ICD-10-CM | POA: Diagnosis not present

## 2019-08-10 DIAGNOSIS — R1011 Right upper quadrant pain: Secondary | ICD-10-CM | POA: Diagnosis not present

## 2019-08-10 DIAGNOSIS — Z86711 Personal history of pulmonary embolism: Secondary | ICD-10-CM | POA: Diagnosis not present

## 2019-08-10 LAB — URINALYSIS, ROUTINE W REFLEX MICROSCOPIC
Bilirubin Urine: NEGATIVE
Glucose, UA: NEGATIVE mg/dL
Hgb urine dipstick: NEGATIVE
Ketones, ur: NEGATIVE mg/dL
Nitrite: NEGATIVE
Protein, ur: NEGATIVE mg/dL
Specific Gravity, Urine: 1.016 (ref 1.005–1.030)
pH: 5 (ref 5.0–8.0)

## 2019-08-10 LAB — COMPREHENSIVE METABOLIC PANEL
ALT: 15 U/L (ref 0–44)
AST: 21 U/L (ref 15–41)
Albumin: 3.4 g/dL — ABNORMAL LOW (ref 3.5–5.0)
Alkaline Phosphatase: 74 U/L (ref 38–126)
Anion gap: 13 (ref 5–15)
BUN: 42 mg/dL — ABNORMAL HIGH (ref 8–23)
CO2: 27 mmol/L (ref 22–32)
Calcium: 9.7 mg/dL (ref 8.9–10.3)
Chloride: 96 mmol/L — ABNORMAL LOW (ref 98–111)
Creatinine, Ser: 2.07 mg/dL — ABNORMAL HIGH (ref 0.44–1.00)
GFR calc Af Amer: 26 mL/min — ABNORMAL LOW (ref >60)
GFR calc non Af Amer: 22 mL/min — ABNORMAL LOW (ref >60)
Glucose, Bld: 94 mg/dL (ref 70–99)
Potassium: 4.1 mmol/L (ref 3.5–5.1)
Sodium: 136 mmol/L (ref 135–145)
Total Bilirubin: 0.7 mg/dL (ref 0.3–1.2)
Total Protein: 7.1 g/dL (ref 6.5–8.1)

## 2019-08-10 LAB — CBC
HCT: 37.3 % (ref 36.0–46.0)
Hemoglobin: 11.8 g/dL — ABNORMAL LOW (ref 12.0–15.0)
MCH: 28.2 pg (ref 26.0–34.0)
MCHC: 31.6 g/dL (ref 30.0–36.0)
MCV: 89.2 fL (ref 80.0–100.0)
Platelets: 180 10*3/uL (ref 150–400)
RBC: 4.18 MIL/uL (ref 3.87–5.11)
RDW: 14.5 % (ref 11.5–15.5)
WBC: 5.3 10*3/uL (ref 4.0–10.5)
nRBC: 0 % (ref 0.0–0.2)

## 2019-08-10 LAB — LIPASE, BLOOD: Lipase: 17 U/L (ref 11–51)

## 2019-08-10 LAB — TROPONIN I (HIGH SENSITIVITY): Troponin I (High Sensitivity): 6 ng/L (ref <18)

## 2019-08-10 MED ORDER — DICLOFENAC SODIUM 1 % EX GEL: 4.0000 g | Freq: Four times a day (QID) | CUTANEOUS | 0 refills | Status: DC

## 2019-08-10 MED ORDER — LIDOCAINE 5 % EX PTCH
1.0000 | MEDICATED_PATCH | CUTANEOUS | Status: DC
  Administered 2019-08-10: 11:00:00 1 via TRANSDERMAL
  Filled 2019-08-10: qty 1

## 2019-08-10 MED ORDER — LACTATED RINGERS IV BOLUS
500.0000 mL | Freq: Once | INTRAVENOUS | Status: AC
  Administered 2019-08-10: 500 mL via INTRAVENOUS

## 2019-08-10 MED ORDER — FENTANYL CITRATE (PF) 100 MCG/2ML IJ SOLN
50.0000 ug | Freq: Once | INTRAMUSCULAR | Status: AC
  Administered 2019-08-10: 50 ug via INTRAVENOUS
  Filled 2019-08-10: qty 2

## 2019-08-10 MED ORDER — MORPHINE SULFATE (PF) 2 MG/ML IV SOLN
2.0000 mg | Freq: Once | INTRAVENOUS | Status: AC
  Administered 2019-08-10: 2 mg via INTRAVENOUS
  Filled 2019-08-10: qty 1

## 2019-08-10 MED ORDER — ONDANSETRON HCL 4 MG/2ML IJ SOLN
4.0000 mg | Freq: Once | INTRAMUSCULAR | Status: AC
  Administered 2019-08-10: 4 mg via INTRAVENOUS
  Filled 2019-08-10: qty 2

## 2019-08-10 NOTE — ED Triage Notes (Signed)
Pt reports right sided flank pain since Monday, pt describes it as sharp. Denies n/v/d. No urinary s/s. Pt a.o, nad noted

## 2019-08-10 NOTE — ED Notes (Signed)
Pt SpO2 stopped to 86% with good waveform. Pt denies SOB and no distress noted. Placed on 2 liters with SpO2 increasing to 98%. MD aware.

## 2019-08-10 NOTE — ED Provider Notes (Signed)
[facility] EMERGENCY DEPARTMENT Provider Note   CSN: [id number] Arrival date & time: 08/10/19  [id number]     History Chief Complaint  Patient presents with  . Flank Pain    [name] is a 79 y.o. female.  Patient is a 79 year old female with a history of prothrombin gene mutation resulting in prior PE and DVT who is currently on aspirin, hemochromatosis, stroke, recent hospitalization with catheterization with minimal coronary disease and CHF undergoing diuresis, chronic back problems and prior UTIs who is presenting today with right-sided pain.  She reports the pain started on Monday and was in her right side and back.  Seems to be much worse with movement but does not seem to be affected by eating.  She has had more frequent bowel movements in the last few days to where she is having 3 bowel movements a day instead of 1.  She denies any vomiting but has occasional nausea.  She has no cough, shortness of breath.  No fever but does report some occasional urgency and frequency of her urine.  She denies any known trauma or activity that would have started the pain.  She initially made an appointment to see her doctor on Monday but then reports the pain went away and she canceled her appointment.  It then came back Monday night and has been constant all day yesterday.  The pain does not radiate she describes it as sharp and will catch her breath with certain movements.  No prior abdominal surgeries.  The history is provided by the patient.  Flank Pain This is a new problem. Episode onset: 3 days ago. The problem occurs constantly. The problem has been gradually worsening. Associated symptoms include abdominal pain. Pertinent negatives include no shortness of breath. The symptoms are aggravated by bending and twisting (movement). Nothing relieves the symptoms. Treatments tried: percocet, rest, flexeril. The treatment provided no relief.       Past Medical History:  Diagnosis  Date  . Anemia   . Arthritis   . Bell's palsy   . GERD (gastroesophageal reflux disease)   . Hemochromatosis   . History of hiatal hernia   . Hypertension   . Iron deficiency anemia due to chronic blood loss 03/10/2017  . PE (pulmonary embolism)   . Pneumonia    "walking" pneumonia  . Prothrombin gene mutation ([place]) 05/30/2013  . Restless legs   . Right leg DVT ([place]) 05/30/2013  . Stroke [facility])    found on a MRI, she's not aware otherwise    Patient Active Problem List   Diagnosis Date Noted  . Abnormal nuclear cardiac imaging test   . CHF (congestive heart failure) ([place]) 07/09/2019  . Dyspnea 07/09/2019  . Respiratory failure with hypoxia ([place]) 07/09/2019  . Shoulder pain 07/09/2019  . Dyspnea on exertion 06/03/2019  . Bilateral lower extremity edema 06/03/2019  . Left bundle branch block 06/03/2019  . Iron deficiency anemia due to chronic blood loss 03/10/2017  . Postoperative wound dehiscence 12/02/2016  . E. coli UTI   . Abnormal urinalysis   . Medication side effect   . Neuropathic pain   . History of DVT (deep vein thrombosis)   . Benign essential HTN   . RLS (restless legs syndrome)   . Urinary retention   . Hypokalemia   . Hypoalbuminemia due to protein-calorie malnutrition ([place])   . Acute blood loss anemia   . Lumbar stenosis with neurogenic claudication 11/18/2016  . Spondylolisthesis of lumbar region  11/12/2016  . Hereditary hemochromatosis ([place]) 07/28/2016  . Prothrombin gene mutation ([place]) 05/30/2013  . Right leg DVT ([place]) 05/30/2013  . Hemochromatosis 07/29/2012    Past Surgical History:  Procedure Laterality Date  . BACK SURGERY    . COLONOSCOPY    . RIGHT/LEFT HEART CATH AND CORONARY ANGIOGRAPHY N/A 07/12/2019   Procedure: RIGHT/LEFT HEART CATH AND CORONARY ANGIOGRAPHY;  Surgeon: Troy Sine, MD;  Location: [facility] CV LAB;  Service: Cardiovascular;  Laterality: N/A;  . SHOULDER SURGERY Bilateral      OB History   No obstetric history on  file.     Family History  Problem Relation Age of Onset  . Congestive Heart Failure Mother   . Pancreatic cancer Father     Social History   Tobacco Use  . Smoking status: Never Smoker  . Smokeless tobacco: Never Used  . Tobacco comment: never used tobacco  Substance Use Topics  . Alcohol use: No    Alcohol/week: 0.0 standard drinks  . Drug use: No    Home Medications Prior to Admission medications   Medication Sig Start Date End Date Taking? Authorizing Provider  aspirin EC 81 MG tablet Take 162 mg by mouth daily.   Yes [provider]  atorvastatin (LIPITOR) 40 MG tablet Take 1 tablet (40 mg total) by mouth daily at 6 PM. 07/13/19  Yes Darrick Meigs, Marge Duncans, MD  Biotin 5000 MCG TABS Take 5,000 mcg by mouth every morning.   Yes [provider]  calcium carbonate (CALCIUM 600) 600 MG TABS tablet Take 600 mg by mouth daily with breakfast.   Yes [provider]  cholecalciferol (VITAMIN D3) 25 MCG (1000 UNIT) tablet Take 1,000 Units by mouth 2 (two) times daily.    Yes [provider]  citalopram (CELEXA) 20 MG tablet Take 20 mg by mouth at bedtime.  08/05/11  Yes [provider]  colchicine 0.6 MG tablet Take 1 tablet (0.6 mg total) by mouth daily. Gout flare up. Patient taking differently: Take 0.6 mg by mouth daily as needed (gout flare).  12/02/16  Yes Love, Ivan Anchors, PA-C  cycloSPORINE (RESTASIS) 0.05 % ophthalmic emulsion Place 1 drop into both eyes 2 (two) times daily.   Yes [provider]  furosemide (LASIX) 80 MG tablet Take 1 tablet (80 mg total) by mouth daily. 07/13/19 07/12/20 Yes Lama, Marge Duncans, MD  Hypromellose (ARTIFICIAL TEARS OP) Place 1 drop into both eyes 3 (three) times daily as needed (dry eyes).   Yes [provider]  losartan (COZAAR) 50 MG tablet Take 1 tablet (50 mg total) by mouth daily. 07/13/19  Yes Oswald Hillock, MD  metoprolol succinate (TOPROL-XL) 25 MG 24 hr tablet Take 1 tablet (25 mg total) by mouth  daily. Patient taking differently: Take 25 mg by mouth every morning.  07/14/19  Yes Oswald Hillock, MD  Multiple Vitamin (MULTIVITAMIN WITH MINERALS) TABS tablet Take 1 tablet by mouth daily. Centrum Silver   Yes [provider]  Omega-3 Fatty Acids (FISH OIL) 1200 MG CAPS Take 1,200 mg by mouth daily.   Yes [provider]  omeprazole (PRILOSEC) 20 MG capsule Take 20 mg by mouth daily. 07/04/19  Yes [provider]  oxyCODONE-acetaminophen (PERCOCET/ROXICET) 5-325 MG tablet Take 1 tablet by mouth every 6 (six) hours as needed (pain).  03/10/17  Yes [provider]  polycarbophil (FIBERCON) 625 MG tablet Take 625 mg by mouth in the morning and at bedtime.   Yes [provider]  pramipexole (MIRAPEX) 0.5 MG tablet Take 0.5 mg by mouth at bedtime.    Yes [provider]  temazepam (RESTORIL) 15 MG capsule Take 1 capsule (15 mg total) by mouth at bedtime. 12/02/16  Yes Love, Ivan Anchors, PA-C  tiZANidine (ZANAFLEX) 4 MG tablet Take 4 mg by mouth at bedtime as needed for muscle spasms.  03/21/19  Yes [provider]  Turmeric Curcumin 500 MG CAPS Take 500 mg by mouth at bedtime.    Yes [provider]  VITAMIN E PO Take 1 capsule by mouth at bedtime.   Yes [provider]    Allergies    Morphine and related, Penicillins, and Sulfa antibiotics  Review of Systems   Review of Systems  Respiratory: Negative for shortness of breath.   Gastrointestinal: Positive for abdominal pain.  Genitourinary: Positive for flank pain.  All other systems reviewed and are negative.   Physical Exam Updated Vital Signs BP (!) 127/54 (BP Location: Right Arm)   Pulse 61   Temp 98.2 F (36.8 C) (Oral)   Resp 19   Ht 5' 2.25" (1.581 m)   Wt 78.9 kg   SpO2 100%   BMI 31.57 kg/m   Physical Exam Vitals and nursing note reviewed.  Constitutional:      General: She is not in acute distress.    Appearance: Normal appearance. She is  well-developed and normal weight.  HENT:     Head: Normocephalic and atraumatic.  Eyes:     Conjunctiva/sclera: Conjunctivae normal.     Pupils: Pupils are equal, round, and reactive to light.  Cardiovascular:     Rate and Rhythm: Normal rate and regular rhythm.     Pulses: Normal pulses.     Heart sounds: No murmur.  Pulmonary:     Effort: Pulmonary effort is normal. No respiratory distress.     Breath sounds: Normal breath sounds. No wheezing or rales.  Abdominal:     General: There is no distension.     Palpations: Abdomen is soft.     Tenderness: There is abdominal tenderness in the right upper quadrant. There is right CVA tenderness. There is no guarding or rebound.       Comments: Pain in the right upper quadrant, mid abdomen abdomen and right flank.  Pain is significantly reproduced with movement of the torso.  No midline back tenderness.  Musculoskeletal:        General: No tenderness. Normal range of motion.     Cervical back: Normal range of motion and neck supple.     Right lower leg: No edema.     Left lower leg: No edema.  Skin:    General: Skin is warm and dry.     Findings: No erythema or rash.  Neurological:     General: No focal deficit present.     Mental Status: She is alert and oriented to person, place, and time. Mental status is at baseline.     Comments: Chronic left-sided foot drop  Psychiatric:        Mood and Affect: Mood normal.        Behavior: Behavior normal.        Thought Content: Thought content normal.     ED Results / Procedures / Treatments   Labs (all labs ordered are listed, but only abnormal results are displayed) Labs Reviewed  URINALYSIS, ROUTINE W REFLEX MICROSCOPIC - Abnormal; Notable for the following components:      Result  Value   APPearance CLOUDY (*)    Leukocytes,Ua LARGE (*)    Bacteria, UA RARE (*)    Non Squamous Epithelial 0-5 (*)    All other components within normal limits  CBC - Abnormal; Notable for the  following components:   Hemoglobin 11.8 (*)    All other components within normal limits  COMPREHENSIVE METABOLIC PANEL - Abnormal; Notable for the following components:   Chloride 96 (*)    BUN 42 (*)    Creatinine, Ser 2.07 (*)    Albumin 3.4 (*)    GFR calc non Af Amer 22 (*)    GFR calc Af Amer 26 (*)    All other components within normal limits  LIPASE, BLOOD  TROPONIN I (HIGH SENSITIVITY)  TROPONIN I (HIGH SENSITIVITY)    EKG EKG Interpretation  Date/Time:  Wednesday August 10 2019 09:08:33 EDT Ventricular Rate:  53 PR Interval:    QRS Duration: 150 QT Interval:  500 QTC Calculation: 470 R Axis:   48 Text Interpretation: Sinus rhythm Anteroseptal infarct, possibly acute Confirmed by Blanchie Dessert [phone]) on 08/10/2019 9:11:31 AM   Radiology CT ABDOMEN PELVIS WO CONTRAST  Result Date: 08/10/2019 CLINICAL DATA:  Right upper quadrant abdominal pain. Right flank pain. EXAM: CT ABDOMEN AND PELVIS WITHOUT CONTRAST TECHNIQUE: Multidetector CT imaging of the abdomen and pelvis was performed following the standard protocol without IV contrast. COMPARISON:  10/25/2004 from [facility], report not available. FINDINGS: Lower chest: Clear lung bases. Mild cardiomegaly. Right coronary artery atherosclerosis. Moderate hiatal hernia. Apparent wall thickening within the herniated stomach, including on 04/03. Hepatobiliary: Normal liver. Normal gallbladder, without biliary ductal dilatation. Pancreas: Normal pancreas for age, with mild atrophy but no acute inflammation. Spleen: Normal in size, without focal abnormality. Adrenals/Urinary Tract: Normal adrenal glands. Mild bilateral renal cortical thinning/scarring. 4 mm lower pole left renal collecting system calculus. No bladder calculi. Stomach/Bowel: Normal remainder of the stomach. Scattered colonic diverticula. Colonic stool burden suggests constipation. Normal terminal ileum. Normal small bowel. Vascular/Lymphatic: Aortic  atherosclerosis. No abdominopelvic adenopathy. Reproductive: Normal uterus and adnexa. Other: No significant free fluid. Mild pelvic floor laxity. Fat containing ventral abdominal wall laxity is right paramidline on 37/3. Musculoskeletal: L4-S1 trans pedicle screw fixation. Advanced lumbar spondylosis with moderate convex left spinal curvature. IMPRESSION: 1. Left nephrolithiasis. 2. Possible constipation. 3. Moderate hiatal hernia. Apparent wall thickening within the herniated stomach is nonspecific. Correlate with any symptoms of gastritis. 4. Coronary artery atherosclerosis. Aortic Atherosclerosis (ICD10-I70.0). Electronically Signed   By: Abigail Miyamoto M.D.   On: 08/10/2019 12:01   DG Chest Port 1 View  Result Date: 08/10/2019 CLINICAL DATA:  Right upper quadrant pain chest pain EXAM: PORTABLE CHEST 1 VIEW COMPARISON:  07/09/2019 FINDINGS: Cardiac enlargement without heart failure. Atherosclerotic aorta. Retrocardiac density compatible with moderate hiatal hernia. Lungs are clear without infiltrate or effusion. Advanced degenerative change right shoulder. Degenerative change and loose body in the left shoulder. IMPRESSION: No active disease. Electronically Signed   By: Franchot Gallo M.D.   On: 08/10/2019 08:58    Procedures Procedures (including critical care time)  Medications Ordered in ED Medications  lidocaine (LIDODERM) 5 % 1 patch (1 patch Transdermal Patch Applied 08/10/19 1049)  fentaNYL (SUBLIMAZE) injection 50 mcg (50 mcg Intravenous Given 08/10/19 0838)  ondansetron (ZOFRAN) injection 4 mg (4 mg Intravenous Given 08/10/19 0838)  lactated ringers bolus 500 mL (0 mLs Intravenous Stopped 08/10/19 1000)  morphine 2 MG/ML injection 2 mg (2 mg Intravenous Given 08/10/19 1050)  ED Course  I have reviewed the triage vital signs and the nursing notes.  Pertinent labs & imaging results that were available during my care of the patient were reviewed by me and considered in my medical decision  making (see chart for details).    MDM Rules/Calculators/A&P                      Patient is a 79 year old female presenting with nonspecific right sided abdominal and back pain.  Patient has multiple medical problems but on exam pain is most exacerbated by certain movements.  Patient has no evidence of rash or signs for zoster.  Concern for possible hepatitis, cholecystitis, renal stone, pyelonephritis.  Patient symptoms could be from thoracic radiculopathy.  Lower suspicion for epidural abscess or discitis at this time.  Lower suspicion for dissection.  Patient just had a CTA to rule out clot 3 weeks ago with lower suspicion that this is PE.  Patient denies any cough or congestion suggestive of pneumonia. EKG today is unchanged from her most recent EKG at the end of March.  Today she does have a bundle branch block which she does have on her medical history but was not present 3 weeks ago.  Spoke with Dr. Gwenlyn Found with cardiology and he said other than cycling troponins he would not specifically act upon this EKG given her relatively normal anatomy just 3 weeks ago.  Patient is denying any chest pain at this time.  Patient's troponin is reassuring at 6 and lower than prior tracings.  Patient's urine today does show large leukocytes with white and red cells but it is a contaminated sample, CBC without acute findings, CMP does show new AKI with creatinine of 2.07 from baseline of 1.2 and lipase was within normal limits.  We will do CT to further evaluate.  After receiving fentanyl patient reported that she did not feel well and had a drop in her oxygen saturation to 86%.  However after a 500 mL bolus and oxygen she reported she felt much better.  She is remained stable.  12:02 PM Fentanyl did not improve the patient's pain.  She was given a small dose of morphine and a Lidoderm patch.  2:47 PM Patient CT with evidence of eye it will hiatal hernia with mild wall thickening but no other acute findings.  Do  not feel that that is the source of patient's pain today.  Patient's pain seems more musculoskeletal as it is reproduced by certain movements.  Symptoms have not significantly changed minor improvement with pain medications but patient states it is about the same.  Patient has pain medicine and muscle relaxer at home.  Will add Voltaren gel.  Given AKI patient will need follow-up with her PCP to repeat creatinine in 1 week as this may be from recent medication changes.  Also patient sees neurosurgery Dr. Arnoldo Morale and recommended follow-up with him if she continues to have issues.  Patient and husband were given strict return precautions.  Questions were answered and patient was discharged home in stable condition.  MDM Number of Diagnoses or Management Options Right flank pain: new, needed workup   Amount and/or Complexity of Data Reviewed Clinical lab tests: ordered and reviewed Tests in the radiology section of CPT: ordered and reviewed Tests in the medicine section of CPT: ordered and reviewed Discussion of test results with the performing providers: yes Decide to obtain previous medical records or to obtain history from someone other than the patient:  yes Obtain history from someone other than the patient: yes Review and summarize past medical records: yes Discuss the patient with other providers: no Independent visualization of images, tracings, or specimens: yes  Risk of Complications, Morbidity, and/or Mortality Presenting problems: moderate Diagnostic procedures: minimal Management options: minimal  Patient Progress Patient progress: stable    Final Clinical Impression(s) / ED Diagnoses Final diagnoses:  Right flank pain    Rx / DC Orders ED Discharge Orders         Ordered    diclofenac Sodium (VOLTAREN) 1 % GEL  4 times daily     08/10/19 1404           Blanchie Dessert, MD 08/10/19 1449

## 2019-08-10 NOTE — Discharge Instructions (Signed)
All the labs today look pretty good.  You do have some mild stress on your kidneys and that needs to be rechecked in 1 week to make sure the medications or not stressing your kidneys that were recently changed.  Urine today shows some white blood cells but no significant signs for infection.  A culture was done and someone will contact you in 2 days if there is something that comes back on the culture that is concerning.  The CAT scan today showed your hiatal hernia but no other findings that would be concerning or the cause for your pain.  Your liver, gallbladder and intestines are all normal.  Your x-ray did not show any signs of pneumonia or other findings at this time.  You can continue to use the pain medicine and muscle relaxer you have at home but you can also use the Voltaren gel up to 4 times a day where it is hurting.  You may have to use your lift chair and make sure you are not making movements that seem to make the pain worse.  If you start having vomiting, fever if the pain moves or you are unable to breathe you need to return to the emergency room immediately.

## 2019-08-11 LAB — URINE CULTURE

## 2019-08-12 DIAGNOSIS — N183 Chronic kidney disease, stage 3 unspecified: Secondary | ICD-10-CM | POA: Diagnosis not present

## 2019-08-12 DIAGNOSIS — G259 Extrapyramidal and movement disorder, unspecified: Secondary | ICD-10-CM | POA: Diagnosis not present

## 2019-08-12 DIAGNOSIS — N390 Urinary tract infection, site not specified: Secondary | ICD-10-CM | POA: Diagnosis not present

## 2019-08-12 DIAGNOSIS — E7800 Pure hypercholesterolemia, unspecified: Secondary | ICD-10-CM | POA: Diagnosis not present

## 2019-08-12 DIAGNOSIS — G250 Essential tremor: Secondary | ICD-10-CM | POA: Diagnosis not present

## 2019-08-12 DIAGNOSIS — R109 Unspecified abdominal pain: Secondary | ICD-10-CM | POA: Diagnosis not present

## 2019-08-12 DIAGNOSIS — I10 Essential (primary) hypertension: Secondary | ICD-10-CM | POA: Diagnosis not present

## 2019-08-12 DIAGNOSIS — M25519 Pain in unspecified shoulder: Secondary | ICD-10-CM | POA: Diagnosis not present

## 2019-08-16 ENCOUNTER — Ambulatory Visit: Payer: PPO | Admitting: Cardiovascular Disease

## 2019-08-16 ENCOUNTER — Other Ambulatory Visit: Payer: Self-pay

## 2019-08-16 ENCOUNTER — Encounter: Payer: Self-pay | Admitting: Cardiovascular Disease

## 2019-08-16 VITALS — BP 102/46 | HR 58 | Ht 62.5 in | Wt 172.8 lb

## 2019-08-16 DIAGNOSIS — I5043 Acute on chronic combined systolic (congestive) and diastolic (congestive) heart failure: Secondary | ICD-10-CM

## 2019-08-16 DIAGNOSIS — I10 Essential (primary) hypertension: Secondary | ICD-10-CM | POA: Diagnosis not present

## 2019-08-16 DIAGNOSIS — I447 Left bundle-branch block, unspecified: Secondary | ICD-10-CM | POA: Diagnosis not present

## 2019-08-16 NOTE — Assessment & Plan Note (Signed)
[name] was admitted to the hospital 07/09/2019 with heart failure.  A 2D echocardiogram revealed an EF of 45 to 50% with grade 1 diastolic dysfunction and normal valvular function.  She did undergo right left heart cath by Dr. Claiborne Billings revealing at most a 40% proximal LAD lesion with elevated right atrial pressure, wedge pressure and LVEDP.  This suggested biventricular failure.  She was diuresed and currently is down to 172 pounds from 188 pounds back in February.  She is aware of salt restriction is on 80 mg of Lasix a day.  She no longer complains of shortness of breath.

## 2019-08-16 NOTE — Assessment & Plan Note (Signed)
History of essential hypertension a blood pressure measured at 102/46.  She is on losartan and metoprolol.

## 2019-08-16 NOTE — Assessment & Plan Note (Signed)
Intermittent and possibly related

## 2019-08-16 NOTE — Patient Instructions (Signed)
Medication Instructions:  Your physician recommends that you continue on your current medications as directed. Please refer to the Current Medication list given to you today.  *If you need a refill on your cardiac medications before your next appointment, please call your pharmacy*  Follow-Up: At [facility], you and your health needs are our priority.  As part of our continuing mission to provide you with exceptional heart care, we have created designated Provider Care Teams.  These Care Teams include your primary Cardiologist (physician) and Advanced Practice Providers (APPs -  Physician Assistants and Nurse Practitioners) who all work together to provide you with the care you need, when you need it.  We recommend signing up for the patient portal called "MyChart".  Sign up information is provided on this After Visit Summary.  MyChart is used to connect with patients for Virtual Visits (Telemedicine).  Patients are able to view lab/test results, encounter notes, upcoming appointments, etc.  Non-urgent messages can be sent to your provider as well.   To learn more about what you can do with MyChart, go to [email or web address].    Your next appointment:   3 month(s)  The format for your next appointment:   In Person  Provider:    Kerin Ransom, PA-C  Sande Rives, PA-C  Coletta Memos, FNP   Dr. Gwenlyn Found in 6 months

## 2019-08-16 NOTE — Progress Notes (Signed)
08/16/2019 [name]   [birth year]  [id number]  Primary Physician Jodi Harp, MD Primary Cardiologist: Jodi Harp MD [name], [place]  HPI:  ARENI [name] is a 79 y.o.  moderately overweight married Caucasian female mother of 2 children, grandmother of 33 grandchildren referred by [name] for evaluation of dyspnea on exertion.  She is retired from being in accounts payable in data entry.  I last saw her in the office 07/01/2019. Her risk factors include treated hypertension and mild untreated hyperlipidemia.  One of her sisters did have CABG.  There is a question that she has had a remote stroke.  She is never had a heart attack.  She denies chest pain.  She does have GERD.  She has had right lower extremity DVT 10 years ago complicated by pulmonary embolism on Xarelto.  She says over the last several years she said progressive dyspnea on exertion.  She was admitted to the hospital 07/09/2019 for 4 days and heart failure.  She underwent right and left heart cath by [name] revealing almost 40% proximal LAD lesion with elevated right atrial pressure, pulmonary artery pressure, wedge pressure and LVEDP.  She was diuresed and currently weighs 172 pounds down from 188 pounds on 06/03/2019.  She is aware of salt restriction.  She is on furosemide 80 mg a day and currently denies shortness of breath.   Current Meds  Medication Sig  . aspirin EC 81 MG tablet Take 162 mg by mouth daily.  Marland Kitchen atorvastatin (LIPITOR) 40 MG tablet Take 1 tablet (40 mg total) by mouth daily at 6 PM.  . Biotin 5000 MCG TABS Take 5,000 mcg by mouth every morning.  . calcium carbonate (CALCIUM 600) 600 MG TABS tablet Take 600 mg by mouth daily with breakfast.  . cholecalciferol (VITAMIN D3) 25 MCG (1000 UNIT) tablet Take 1,000 Units by mouth 2 (two) times daily.   . citalopram (CELEXA) 20 MG tablet Take 20 mg by mouth at bedtime.   . colchicine 0.6 MG tablet Take 1 tablet (0.6 mg total) by mouth  daily. Gout flare up. (Patient taking differently: Take 0.6 mg by mouth daily as needed (gout flare). )  . cycloSPORINE (RESTASIS) 0.05 % ophthalmic emulsion Place 1 drop into both eyes 2 (two) times daily.  . diclofenac Sodium (VOLTAREN) 1 % GEL Apply 4 g topically 4 (four) times daily.  . furosemide (LASIX) 80 MG tablet Take 1 tablet (80 mg total) by mouth daily.  . Hypromellose (ARTIFICIAL TEARS OP) Place 1 drop into both eyes 3 (three) times daily as needed (dry eyes).  Marland Kitchen losartan (COZAAR) 50 MG tablet Take 1 tablet (50 mg total) by mouth daily.  . metoprolol succinate (TOPROL-XL) 25 MG 24 hr tablet Take 1 tablet (25 mg total) by mouth daily. (Patient taking differently: Take 25 mg by mouth every morning. )  . Multiple Vitamin (MULTIVITAMIN WITH MINERALS) TABS tablet Take 1 tablet by mouth daily. Centrum Silver  . Omega-3 Fatty Acids (FISH OIL) 1200 MG CAPS Take 1,200 mg by mouth daily.  Marland Kitchen omeprazole (PRILOSEC) 20 MG capsule Take 20 mg by mouth daily.  Marland Kitchen oxyCODONE-acetaminophen (PERCOCET/ROXICET) 5-325 MG tablet Take 1 tablet by mouth every 6 (six) hours as needed (pain).   . polycarbophil (FIBERCON) 625 MG tablet Take 625 mg by mouth in the morning and at bedtime.  . pramipexole (MIRAPEX) 0.5 MG tablet Take 0.5 mg by mouth at bedtime.   . temazepam (RESTORIL)  15 MG capsule Take 1 capsule (15 mg total) by mouth at bedtime.  Marland Kitchen tiZANidine (ZANAFLEX) 4 MG tablet Take 4 mg by mouth at bedtime as needed for muscle spasms.   . Turmeric Curcumin 500 MG CAPS Take 500 mg by mouth at bedtime.   Marland Kitchen VITAMIN E PO Take 1 capsule by mouth at bedtime.     Allergies  Allergen Reactions  . Morphine And Related Other (See Comments)    LARGER DOSES OF MORPHINE CAUSES BODY TWITCHING  . Penicillins Anaphylaxis    Childhood reaction Has patient had a PCN reaction causing immediate rash, facial/tongue/throat swelling, SOB or lightheadedness with hypotension: Yes Has patient had a PCN reaction causing severe  rash involving mucus membranes or skin necrosis: Unknown Has patient had a PCN reaction that required hospitalization: No Has patient had a PCN reaction occurring within the last 10 years: no (5 or 79 yrs old) If all of the above answers are "NO", then may proceed with Cephalosporin use.   . Sulfa Antibiotics Other (See Comments)    Unknown childhood reaction    Social History   Socioeconomic History  . Marital status: Married    Spouse name: Not on file  . Number of children: Not on file  . Years of education: Not on file  . Highest education level: Not on file  Occupational History  . Not on file  Tobacco Use  . Smoking status: Never Smoker  . Smokeless tobacco: Never Used  . Tobacco comment: never used tobacco  Substance and Sexual Activity  . Alcohol use: No    Alcohol/week: 0.0 standard drinks  . Drug use: No  . Sexual activity: Not on file  Other Topics Concern  . Not on file  Social History Narrative  . Not on file   Social Determinants of Health   Financial Resource Strain:   . Difficulty of Paying Living Expenses:   Food Insecurity:   . Worried About Charity fundraiser in the Last Year:   . Arboriculturist in the Last Year:   Transportation Needs:   . Film/video editor (Medical):   Marland Kitchen Lack of Transportation (Non-Medical):   Physical Activity:   . Days of Exercise per Week:   . Minutes of Exercise per Session:   Stress:   . Feeling of Stress :   Social Connections:   . Frequency of Communication with Friends and Family:   . Frequency of Social Gatherings with Friends and Family:   . Attends Religious Services:   . Active Member of Clubs or Organizations:   . Attends Archivist Meetings:   Marland Kitchen Marital Status:   Intimate Partner Violence:   . Fear of Current or Ex-Partner:   . Emotionally Abused:   Marland Kitchen Physically Abused:   . Sexually Abused:      Review of Systems: General: negative for chills, fever, night sweats or weight changes.    Cardiovascular: negative for chest pain, dyspnea on exertion, edema, orthopnea, palpitations, paroxysmal nocturnal dyspnea or shortness of breath Dermatological: negative for rash Respiratory: negative for cough or wheezing Urologic: negative for hematuria Abdominal: negative for nausea, vomiting, diarrhea, bright red blood per rectum, melena, or hematemesis Neurologic: negative for visual changes, syncope, or dizziness All other systems reviewed and are otherwise negative except as noted above.    Blood pressure (!) 102/46, pulse (!) 58, height 5' 2.5" (1.588 m), weight 172 lb 12.8 oz (78.4 kg), SpO2 95 %.  General appearance: alert  and no distress Neck: no adenopathy, no carotid bruit, no JVD, supple, symmetrical, trachea midline and thyroid not enlarged, symmetric, no tenderness/mass/nodules Lungs: clear to auscultation bilaterally Heart: regular rate and rhythm, S1, S2 normal, no murmur, click, rub or gallop Extremities: extremities normal, atraumatic, no cyanosis or edema Pulses: 2+ and symmetric Skin: Skin color, texture, turgor normal. No rashes or lesions Neurologic: Alert and oriented X 3, normal strength and tone. Normal symmetric reflexes. Normal coordination and gait  EKG sinus bradycardia 58 with left bundle branch block.  I personally reviewed this EKG.  ASSESSMENT AND PLAN:   Benign essential HTN History of essential hypertension a blood pressure measured at 102/46.  She is on losartan and metoprolol.  Left bundle branch block Intermittent and possibly related  CHF (congestive heart failure) [facility] Ms. Colantonio was admitted to the hospital 07/09/2019 with heart failure.  A 2D echocardiogram revealed an EF of 45 to 50% with grade 1 diastolic dysfunction and normal valvular function.  She did undergo right left heart cath by [name] revealing at most a 40% proximal LAD lesion with elevated right atrial pressure, wedge pressure and LVEDP.  This suggested biventricular  failure.  She was diuresed and currently is down to 172 pounds from 188 pounds back in February.  She is aware of salt restriction is on 80 mg of Lasix a day.  She no longer complains of shortness of breath.      Jodi Harp MD FACP,FACC,FAHA, [facility] 08/16/2019 4:44 PM

## 2019-08-17 ENCOUNTER — Telehealth: Payer: Self-pay | Admitting: *Deleted

## 2019-08-17 NOTE — Telephone Encounter (Signed)
Left message for patient to call regarding ding July 2021---needs to be rescheduled with an APP---instead of Dr. Gwenlyn Found

## 2019-08-18 NOTE — Telephone Encounter (Signed)
Spoke with patient regarding provider change for 3 month follow up ordered by Dr. Jerrol Banana is supposed to see an PA in 3 mos and Dr. Gwenlyn Found in 6 mos---pt will see Coletta Memos, NP on 11/15/19 at 9:45 am.  Will mail updated appointment information to patient

## 2019-08-26 DIAGNOSIS — F329 Major depressive disorder, single episode, unspecified: Secondary | ICD-10-CM | POA: Diagnosis not present

## 2019-08-26 DIAGNOSIS — I10 Essential (primary) hypertension: Secondary | ICD-10-CM | POA: Diagnosis not present

## 2019-08-26 DIAGNOSIS — M19041 Primary osteoarthritis, right hand: Secondary | ICD-10-CM | POA: Diagnosis not present

## 2019-08-26 DIAGNOSIS — M8580 Other specified disorders of bone density and structure, unspecified site: Secondary | ICD-10-CM | POA: Diagnosis not present

## 2019-08-26 DIAGNOSIS — D508 Other iron deficiency anemias: Secondary | ICD-10-CM | POA: Diagnosis not present

## 2019-08-26 DIAGNOSIS — N159 Renal tubulo-interstitial disease, unspecified: Secondary | ICD-10-CM | POA: Diagnosis not present

## 2019-08-26 DIAGNOSIS — E7800 Pure hypercholesterolemia, unspecified: Secondary | ICD-10-CM | POA: Diagnosis not present

## 2019-08-26 DIAGNOSIS — N183 Chronic kidney disease, stage 3 unspecified: Secondary | ICD-10-CM | POA: Diagnosis not present

## 2019-08-26 DIAGNOSIS — M1990 Unspecified osteoarthritis, unspecified site: Secondary | ICD-10-CM | POA: Diagnosis not present

## 2019-09-06 DIAGNOSIS — R58 Hemorrhage, not elsewhere classified: Secondary | ICD-10-CM | POA: Diagnosis not present

## 2019-09-06 DIAGNOSIS — N183 Chronic kidney disease, stage 3 unspecified: Secondary | ICD-10-CM | POA: Diagnosis not present

## 2019-09-15 ENCOUNTER — Encounter (HOSPITAL_COMMUNITY): Payer: Self-pay

## 2019-09-15 ENCOUNTER — Emergency Department (HOSPITAL_COMMUNITY): Payer: PPO

## 2019-09-15 ENCOUNTER — Observation Stay (HOSPITAL_COMMUNITY): Payer: PPO

## 2019-09-15 ENCOUNTER — Inpatient Hospital Stay (HOSPITAL_COMMUNITY)
Admission: EM | Admit: 2019-09-15 | Discharge: 2019-09-21 | DRG: 641 | Disposition: A | Discharge status: Home / Self Care | Payer: PPO | Attending: Internal Medicine | Admitting: Internal Medicine

## 2019-09-15 ENCOUNTER — Other Ambulatory Visit: Payer: Self-pay

## 2019-09-15 DIAGNOSIS — E876 Hypokalemia: Secondary | ICD-10-CM | POA: Diagnosis present

## 2019-09-15 DIAGNOSIS — R509 Fever, unspecified: Secondary | ICD-10-CM

## 2019-09-15 DIAGNOSIS — D631 Anemia in chronic kidney disease: Secondary | ICD-10-CM | POA: Diagnosis not present

## 2019-09-15 DIAGNOSIS — B951 Streptococcus, group B, as the cause of diseases classified elsewhere: Secondary | ICD-10-CM | POA: Diagnosis not present

## 2019-09-15 DIAGNOSIS — R109 Unspecified abdominal pain: Secondary | ICD-10-CM

## 2019-09-15 DIAGNOSIS — K449 Diaphragmatic hernia without obstruction or gangrene: Secondary | ICD-10-CM | POA: Diagnosis present

## 2019-09-15 DIAGNOSIS — M109 Gout, unspecified: Secondary | ICD-10-CM | POA: Diagnosis present

## 2019-09-15 DIAGNOSIS — I159 Secondary hypertension, unspecified: Secondary | ICD-10-CM | POA: Diagnosis not present

## 2019-09-15 DIAGNOSIS — Z882 Allergy status to sulfonamides status: Secondary | ICD-10-CM

## 2019-09-15 DIAGNOSIS — E860 Dehydration: Secondary | ICD-10-CM | POA: Diagnosis not present

## 2019-09-15 DIAGNOSIS — R0602 Shortness of breath: Secondary | ICD-10-CM | POA: Diagnosis not present

## 2019-09-15 DIAGNOSIS — N183 Chronic kidney disease, stage 3 unspecified: Secondary | ICD-10-CM | POA: Diagnosis present

## 2019-09-15 DIAGNOSIS — I447 Left bundle-branch block, unspecified: Secondary | ICD-10-CM | POA: Diagnosis not present

## 2019-09-15 DIAGNOSIS — Z6833 Body mass index (BMI) 33.0-33.9, adult: Secondary | ICD-10-CM

## 2019-09-15 DIAGNOSIS — E669 Obesity, unspecified: Secondary | ICD-10-CM | POA: Diagnosis present

## 2019-09-15 DIAGNOSIS — I5042 Chronic combined systolic (congestive) and diastolic (congestive) heart failure: Secondary | ICD-10-CM | POA: Diagnosis not present

## 2019-09-15 DIAGNOSIS — Z8673 Personal history of transient ischemic attack (TIA), and cerebral infarction without residual deficits: Secondary | ICD-10-CM

## 2019-09-15 DIAGNOSIS — Z86718 Personal history of other venous thrombosis and embolism: Secondary | ICD-10-CM | POA: Diagnosis not present

## 2019-09-15 DIAGNOSIS — F0390 Unspecified dementia without behavioral disturbance: Secondary | ICD-10-CM | POA: Diagnosis not present

## 2019-09-15 DIAGNOSIS — N390 Urinary tract infection, site not specified: Secondary | ICD-10-CM | POA: Diagnosis present

## 2019-09-15 DIAGNOSIS — I5043 Acute on chronic combined systolic (congestive) and diastolic (congestive) heart failure: Secondary | ICD-10-CM | POA: Diagnosis not present

## 2019-09-15 DIAGNOSIS — G2581 Restless legs syndrome: Secondary | ICD-10-CM | POA: Diagnosis not present

## 2019-09-15 DIAGNOSIS — Z86711 Personal history of pulmonary embolism: Secondary | ICD-10-CM

## 2019-09-15 DIAGNOSIS — R609 Edema, unspecified: Secondary | ICD-10-CM

## 2019-09-15 DIAGNOSIS — Z7982 Long term (current) use of aspirin: Secondary | ICD-10-CM

## 2019-09-15 DIAGNOSIS — D649 Anemia, unspecified: Secondary | ICD-10-CM

## 2019-09-15 DIAGNOSIS — F419 Anxiety disorder, unspecified: Secondary | ICD-10-CM | POA: Diagnosis present

## 2019-09-15 DIAGNOSIS — K219 Gastro-esophageal reflux disease without esophagitis: Secondary | ICD-10-CM | POA: Diagnosis not present

## 2019-09-15 DIAGNOSIS — D6852 Prothrombin gene mutation: Secondary | ICD-10-CM | POA: Diagnosis not present

## 2019-09-15 DIAGNOSIS — N179 Acute kidney failure, unspecified: Secondary | ICD-10-CM | POA: Diagnosis present

## 2019-09-15 DIAGNOSIS — Z8249 Family history of ischemic heart disease and other diseases of the circulatory system: Secondary | ICD-10-CM | POA: Diagnosis not present

## 2019-09-15 DIAGNOSIS — Z20822 Contact with and (suspected) exposure to covid-19: Secondary | ICD-10-CM | POA: Diagnosis not present

## 2019-09-15 DIAGNOSIS — Z885 Allergy status to narcotic agent status: Secondary | ICD-10-CM

## 2019-09-15 DIAGNOSIS — I10 Essential (primary) hypertension: Secondary | ICD-10-CM

## 2019-09-15 DIAGNOSIS — I130 Hypertensive heart and chronic kidney disease with heart failure and stage 1 through stage 4 chronic kidney disease, or unspecified chronic kidney disease: Secondary | ICD-10-CM | POA: Diagnosis present

## 2019-09-15 DIAGNOSIS — Z79899 Other long term (current) drug therapy: Secondary | ICD-10-CM

## 2019-09-15 DIAGNOSIS — M79675 Pain in left toe(s): Secondary | ICD-10-CM | POA: Diagnosis not present

## 2019-09-15 DIAGNOSIS — R0600 Dyspnea, unspecified: Secondary | ICD-10-CM | POA: Diagnosis not present

## 2019-09-15 DIAGNOSIS — Z880 Allergy status to penicillin: Secondary | ICD-10-CM

## 2019-09-15 DIAGNOSIS — I509 Heart failure, unspecified: Secondary | ICD-10-CM

## 2019-09-15 LAB — SARS CORONAVIRUS 2 BY RT PCR (HOSPITAL ORDER, PERFORMED IN ~~LOC~~ HOSPITAL LAB): SARS Coronavirus 2: NEGATIVE

## 2019-09-15 LAB — CBC
HCT: 33.5 % — ABNORMAL LOW (ref 36.0–46.0)
Hemoglobin: 10.7 g/dL — ABNORMAL LOW (ref 12.0–15.0)
MCH: 28.1 pg (ref 26.0–34.0)
MCHC: 31.9 g/dL (ref 30.0–36.0)
MCV: 87.9 fL (ref 80.0–100.0)
Platelets: 161 10*3/uL (ref 150–400)
RBC: 3.81 MIL/uL — ABNORMAL LOW (ref 3.87–5.11)
RDW: 15.9 % — ABNORMAL HIGH (ref 11.5–15.5)
WBC: 8.8 10*3/uL (ref 4.0–10.5)
nRBC: 0 % (ref 0.0–0.2)

## 2019-09-15 LAB — BASIC METABOLIC PANEL
Anion gap: 12 (ref 5–15)
BUN: 49 mg/dL — ABNORMAL HIGH (ref 8–23)
CO2: 27 mmol/L (ref 22–32)
Calcium: 9.4 mg/dL (ref 8.9–10.3)
Chloride: 95 mmol/L — ABNORMAL LOW (ref 98–111)
Creatinine, Ser: 2.39 mg/dL — ABNORMAL HIGH (ref 0.44–1.00)
GFR calc Af Amer: 22 mL/min — ABNORMAL LOW (ref >60)
GFR calc non Af Amer: 19 mL/min — ABNORMAL LOW (ref >60)
Glucose, Bld: 92 mg/dL (ref 70–99)
Potassium: 3.8 mmol/L (ref 3.5–5.1)
Sodium: 134 mmol/L — ABNORMAL LOW (ref 135–145)

## 2019-09-15 LAB — URINALYSIS, ROUTINE W REFLEX MICROSCOPIC
Bilirubin Urine: NEGATIVE
Bilirubin Urine: NEGATIVE
Glucose, UA: NEGATIVE mg/dL
Glucose, UA: NEGATIVE mg/dL
Hgb urine dipstick: NEGATIVE
Hgb urine dipstick: NEGATIVE
Ketones, ur: NEGATIVE mg/dL
Ketones, ur: NEGATIVE mg/dL
Leukocytes,Ua: NEGATIVE
Nitrite: NEGATIVE
Nitrite: NEGATIVE
Protein, ur: NEGATIVE mg/dL
Protein, ur: NEGATIVE mg/dL
Specific Gravity, Urine: 1.016 (ref 1.005–1.030)
Specific Gravity, Urine: 1.014 (ref 1.005–1.030)
WBC, UA: >50 WBC/hpf — ABNORMAL HIGH (ref 0–5)
pH: 5 (ref 5.0–8.0)
pH: 5 (ref 5.0–8.0)

## 2019-09-15 LAB — HEPATIC FUNCTION PANEL
ALT: 21 U/L (ref 0–44)
AST: 28 U/L (ref 15–41)
Albumin: 2.9 g/dL — ABNORMAL LOW (ref 3.5–5.0)
Alkaline Phosphatase: 79 U/L (ref 38–126)
Bilirubin, Direct: 0.2 mg/dL (ref 0.0–0.2)
Indirect Bilirubin: 1.2 mg/dL — ABNORMAL HIGH (ref 0.3–0.9)
Total Bilirubin: 1.4 mg/dL — ABNORMAL HIGH (ref 0.3–1.2)
Total Protein: 6.5 g/dL (ref 6.5–8.1)

## 2019-09-15 MED ORDER — CALCIUM CARBONATE 1250 (500 CA) MG PO TABS
1.0000 | ORAL_TABLET | Freq: Every day | ORAL | Status: DC
  Administered 2019-09-16 – 2019-09-21 (×6): 500 mg via ORAL
  Filled 2019-09-15 (×7): qty 1

## 2019-09-15 MED ORDER — VITAMIN D 25 MCG (1000 UNIT) PO TABS
1000.0000 [IU] | ORAL_TABLET | Freq: Two times a day (BID) | ORAL | Status: DC
  Administered 2019-09-15 – 2019-09-21 (×12): 1000 [IU] via ORAL
  Filled 2019-09-15 (×12): qty 1

## 2019-09-15 MED ORDER — TECHNETIUM TO 99M ALBUMIN AGGREGATED
1.6500 | Freq: Once | INTRAVENOUS | Status: AC | PRN
  Administered 2019-09-15: 1.65 via INTRAVENOUS

## 2019-09-15 MED ORDER — ACETAMINOPHEN 650 MG RE SUPP: 650.0000 mg | Freq: Four times a day (QID) | RECTAL | Status: DC | PRN

## 2019-09-15 MED ORDER — CYCLOSPORINE 0.05 % OP EMUL
1.0000 [drp] | Freq: Two times a day (BID) | OPHTHALMIC | Status: DC
  Administered 2019-09-16 – 2019-09-21 (×12): 1 [drp] via OPHTHALMIC
  Filled 2019-09-15 (×14): qty 1

## 2019-09-15 MED ORDER — LIDOCAINE 5 % EX PTCH
1.0000 | MEDICATED_PATCH | CUTANEOUS | Status: DC
  Administered 2019-09-15: 1 via TRANSDERMAL
  Filled 2019-09-15 (×2): qty 1

## 2019-09-15 MED ORDER — ASPIRIN EC 81 MG PO TBEC
81.0000 mg | DELAYED_RELEASE_TABLET | Freq: Every day | ORAL | Status: DC
  Administered 2019-09-16 – 2019-09-19 (×4): 81 mg via ORAL
  Filled 2019-09-15 (×5): qty 1

## 2019-09-15 MED ORDER — HEPARIN SODIUM (PORCINE) 5000 UNIT/ML IJ SOLN
5000.0000 [IU] | Freq: Three times a day (TID) | INTRAMUSCULAR | Status: DC
  Administered 2019-09-16 – 2019-09-20 (×13): 5000 [IU] via SUBCUTANEOUS
  Filled 2019-09-15 (×14): qty 1

## 2019-09-15 MED ORDER — METOPROLOL SUCCINATE ER 25 MG PO TB24
25.0000 mg | ORAL_TABLET | Freq: Every day | ORAL | Status: DC
  Administered 2019-09-16 – 2019-09-21 (×6): 25 mg via ORAL
  Filled 2019-09-15 (×6): qty 1

## 2019-09-15 MED ORDER — CITALOPRAM HYDROBROMIDE 20 MG PO TABS
20.0000 mg | ORAL_TABLET | Freq: Every day | ORAL | Status: DC
  Administered 2019-09-15 – 2019-09-20 (×6): 20 mg via ORAL
  Filled 2019-09-15 (×6): qty 1

## 2019-09-15 MED ORDER — CALCIUM POLYCARBOPHIL 625 MG PO TABS
625.0000 mg | ORAL_TABLET | Freq: Two times a day (BID) | ORAL | Status: DC
  Administered 2019-09-16 – 2019-09-21 (×12): 625 mg via ORAL
  Filled 2019-09-15 (×14): qty 1

## 2019-09-15 MED ORDER — SODIUM CHLORIDE 0.9 % IV SOLN: INTRAVENOUS | Status: AC

## 2019-09-15 MED ORDER — PRAMIPEXOLE DIHYDROCHLORIDE 0.25 MG PO TABS
0.5000 mg | ORAL_TABLET | Freq: Every day | ORAL | Status: DC
  Administered 2019-09-16 – 2019-09-20 (×6): 0.5 mg via ORAL
  Filled 2019-09-15 (×8): qty 2

## 2019-09-15 MED ORDER — FENTANYL CITRATE (PF) 100 MCG/2ML IJ SOLN: 50.0000 ug | INTRAMUSCULAR | Status: DC | PRN

## 2019-09-15 MED ORDER — PANTOPRAZOLE SODIUM 40 MG PO TBEC
40.0000 mg | DELAYED_RELEASE_TABLET | Freq: Every day | ORAL | Status: DC
  Administered 2019-09-16 – 2019-09-21 (×6): 40 mg via ORAL
  Filled 2019-09-15 (×6): qty 1

## 2019-09-15 MED ORDER — BIOTIN 5000 MCG PO TABS: 5000.0000 ug | ORAL_TABLET | ORAL | Status: DC

## 2019-09-15 MED ORDER — OMEGA-3-ACID ETHYL ESTERS 1 G PO CAPS
1.0000 g | ORAL_CAPSULE | Freq: Every day | ORAL | Status: DC
  Administered 2019-09-16 – 2019-09-21 (×6): 1 g via ORAL
  Filled 2019-09-15 (×6): qty 1

## 2019-09-15 MED ORDER — TURMERIC CURCUMIN 500 MG PO CAPS: 500.0000 mg | ORAL_CAPSULE | Freq: Every day | ORAL | Status: DC

## 2019-09-15 MED ORDER — FISH OIL 1200 MG PO CAPS: 1200.0000 mg | ORAL_CAPSULE | Freq: Every day | ORAL | Status: DC

## 2019-09-15 MED ORDER — PROPOFOL 1000 MG/100ML IV EMUL: 0.0000 ug/kg/min | INTRAVENOUS | Status: DC

## 2019-09-15 MED ORDER — ADULT MULTIVITAMIN W/MINERALS CH
1.0000 | ORAL_TABLET | Freq: Every day | ORAL | Status: DC
  Administered 2019-09-16 – 2019-09-21 (×6): 1 via ORAL
  Filled 2019-09-15 (×6): qty 1

## 2019-09-15 MED ORDER — OXYCODONE-ACETAMINOPHEN 5-325 MG PO TABS
1.0000 | ORAL_TABLET | Freq: Four times a day (QID) | ORAL | Status: DC | PRN
  Administered 2019-09-15 – 2019-09-17 (×6): 1 via ORAL
  Filled 2019-09-15 (×6): qty 1

## 2019-09-15 MED ORDER — TEMAZEPAM 15 MG PO CAPS
15.0000 mg | ORAL_CAPSULE | Freq: Every day | ORAL | Status: DC
  Administered 2019-09-15 – 2019-09-20 (×6): 15 mg via ORAL
  Filled 2019-09-15 (×6): qty 1

## 2019-09-15 MED ORDER — ACETAMINOPHEN 325 MG PO TABS: 650.0000 mg | ORAL_TABLET | Freq: Four times a day (QID) | ORAL | Status: DC | PRN

## 2019-09-15 NOTE — ED Triage Notes (Signed)
Pt arrives to ED w/ c/o 10/10 R sided flank pain. Pt endorses fever. Pt denies n/v. Pt denies urinary symptoms. No hx of kidney stones.

## 2019-09-15 NOTE — ED Provider Notes (Signed)
[facility] EMERGENCY DEPARTMENT Provider Note   CSN: [id number] Arrival date & time: 09/15/19  1128     History Chief Complaint  Patient presents with  . Flank Pain    [name] is a 79 y.o. female.  The history is provided by the patient and medical records. No language interpreter was used.  Flank Pain   [name] is a 79 y.o. female who presents to the Emergency Department complaining of flank pain. She presents the emergency department complaining of several days of right sided flank pain. Pain is constant in nature but significantly worse with moving. He is also worse with deep breaths. She reports temperature to 100.5 at home the last two days. No reports of chest pain, cough, abdominal pain, nausea, vomiting, dysuria. She has been evaluated recently for similar symptoms. She also reports that she feels like she is slightly more swollen and her baseline despite taking her home medications. She states that her weight has gone up 5 pounds over the last few days.    Past Medical History:  Diagnosis Date  . Anemia   . Arthritis   . Bell's palsy   . CHF (congestive heart failure) ([place])   . GERD (gastroesophageal reflux disease)   . Hemochromatosis   . History of hiatal hernia   . Hypertension   . Iron deficiency anemia due to chronic blood loss 03/10/2017  . PE (pulmonary embolism)   . Pneumonia    "walking" pneumonia  . Prothrombin gene mutation ([place]) 05/30/2013  . Restless legs   . Right leg DVT ([place]) 05/30/2013  . Stroke [facility])    found on a MRI, she's not aware otherwise    Patient Active Problem List   Diagnosis Date Noted  . Abnormal nuclear cardiac imaging test   . CHF (congestive heart failure) ([place]) 07/09/2019  . Dyspnea 07/09/2019  . Respiratory failure with hypoxia ([place]) 07/09/2019  . Shoulder pain 07/09/2019  . Dyspnea on exertion 06/03/2019  . Bilateral lower extremity edema 06/03/2019  . Left bundle branch block 06/03/2019   . Iron deficiency anemia due to chronic blood loss 03/10/2017  . Postoperative wound dehiscence 12/02/2016  . E. coli UTI   . Abnormal urinalysis   . Medication side effect   . Neuropathic pain   . History of DVT (deep vein thrombosis)   . Benign essential HTN   . RLS (restless legs syndrome)   . Urinary retention   . Hypokalemia   . Hypoalbuminemia due to protein-calorie malnutrition ([place])   . Acute blood loss anemia   . Lumbar stenosis with neurogenic claudication 11/18/2016  . Spondylolisthesis of lumbar region 11/12/2016  . Hereditary hemochromatosis ([place]) 07/28/2016  . Prothrombin gene mutation ([place]) 05/30/2013  . Right leg DVT ([place]) 05/30/2013  . Hemochromatosis 07/29/2012    Past Surgical History:  Procedure Laterality Date  . BACK SURGERY    . COLONOSCOPY    . RIGHT/LEFT HEART CATH AND CORONARY ANGIOGRAPHY N/A 07/12/2019   Procedure: RIGHT/LEFT HEART CATH AND CORONARY ANGIOGRAPHY;  Surgeon: Troy Sine, MD;  Location: [facility];  Service: Cardiovascular;  Laterality: N/A;  . SHOULDER SURGERY Bilateral      OB History   No obstetric history on file.     Family History  Problem Relation Age of Onset  . Congestive Heart Failure Mother   . Pancreatic cancer Father     Social History   Tobacco Use  . Smoking status: Never Smoker  .  Smokeless tobacco: Never Used  . Tobacco comment: never used tobacco  Substance Use Topics  . Alcohol use: No    Alcohol/week: 0.0 standard drinks  . Drug use: No    Home Medications Prior to Admission medications   Medication Sig Start Date End Date Taking? Authorizing Provider  aspirin EC 81 MG tablet Take 162 mg by mouth daily.   Yes [provider]  Biotin 5000 MCG TABS Take 5,000 mcg by mouth every morning.   Yes [provider]  calcium carbonate (CALCIUM 600) 600 MG TABS tablet Take 600 mg by mouth daily with breakfast.   Yes [provider]  cholecalciferol (VITAMIN D3) 25 MCG  (1000 UNIT) tablet Take 1,000 Units by mouth 2 (two) times daily.    Yes [provider]  citalopram (CELEXA) 20 MG tablet Take 20 mg by mouth at bedtime.  08/05/11  Yes [provider]  colchicine 0.6 MG tablet Take 1 tablet (0.6 mg total) by mouth daily. Gout flare up. Patient taking differently: Take 0.6 mg by mouth daily as needed (gout flare).  12/02/16  Yes Love, Ivan Anchors, PA-C  cycloSPORINE (RESTASIS) 0.05 % ophthalmic emulsion Place 1 drop into both eyes 2 (two) times daily.   Yes [provider]  furosemide (LASIX) 80 MG tablet Take 1 tablet (80 mg total) by mouth daily. 07/13/19 07/12/20 Yes Oswald Hillock, MD  losartan (COZAAR) 50 MG tablet Take 1 tablet (50 mg total) by mouth daily. 07/13/19  Yes Oswald Hillock, MD  metoprolol succinate (TOPROL-XL) 25 MG 24 hr tablet Take 1 tablet (25 mg total) by mouth daily. 07/14/19  Yes Oswald Hillock, MD  Multiple Vitamin (MULTIVITAMIN WITH MINERALS) TABS tablet Take 1 tablet by mouth daily. Centrum Silver   Yes [provider]  Omega-3 Fatty Acids (FISH OIL) 1200 MG CAPS Take 1,200 mg by mouth daily.   Yes [provider]  omeprazole (PRILOSEC) 20 MG capsule Take 20 mg by mouth daily. 07/04/19  Yes [provider]  oxyCODONE-acetaminophen (PERCOCET/ROXICET) 5-325 MG tablet Take 1 tablet by mouth every 6 (six) hours as needed (pain).  03/10/17  Yes [provider]  polycarbophil (FIBERCON) 625 MG tablet Take 625 mg by mouth in the morning and at bedtime.   Yes [provider]  pramipexole (MIRAPEX) 0.5 MG tablet Take 0.5 mg by mouth at bedtime.    Yes [provider]  temazepam (RESTORIL) 15 MG capsule Take 1 capsule (15 mg total) by mouth at bedtime. 12/02/16  Yes Love, Ivan Anchors, PA-C  Turmeric Curcumin 500 MG CAPS Take 500 mg by mouth at bedtime.    Yes [provider]  VITAMIN E PO Take 1 capsule by mouth at bedtime.   Yes [provider]  atorvastatin (LIPITOR)  40 MG tablet Take 1 tablet (40 mg total) by mouth daily at 6 PM. Patient not taking: Reported on 09/15/2019 07/13/19   Oswald Hillock, MD  diclofenac Sodium (VOLTAREN) 1 % GEL Apply 4 g topically 4 (four) times daily. Patient not taking: Reported on 09/15/2019 08/10/19   Blanchie Dessert, MD  Hypromellose (ARTIFICIAL TEARS OP) Place 1 drop into both eyes 3 (three) times daily as needed (dry eyes).    [provider]    Allergies    Morphine and related, Penicillins, and Sulfa antibiotics  Review of Systems   Review of Systems  Genitourinary: Positive for flank pain.  All other systems reviewed and are negative.   Physical  Exam Updated Vital Signs BP 124/77 (BP Location: Right Arm)   Pulse 71   Temp 98.6 F (37 C) (Oral)   Resp 18   Ht 5' 2.5" (1.588 m)   Wt 77.6 kg   SpO2 98%   BMI 30.78 kg/m   Physical Exam Vitals and nursing note reviewed.  Constitutional:      Appearance: She is well-developed.  HENT:     Head: Normocephalic and atraumatic.  Cardiovascular:     Rate and Rhythm: Normal rate and regular rhythm.     Heart sounds: No murmur.  Pulmonary:     Effort: Pulmonary effort is normal. No respiratory distress.     Breath sounds: Normal breath sounds.  Abdominal:     Palpations: Abdomen is soft.     Tenderness: There is no abdominal tenderness. There is no right CVA tenderness, left CVA tenderness, guarding or rebound.  Musculoskeletal:        General: No tenderness.     Comments: None pitting edema to bilateral lower extremities. Five out of five strength and bilateral lower extremities.  Skin:    General: Skin is warm and dry.  Neurological:     Mental Status: She is alert and oriented to person, place, and time.  Psychiatric:        Behavior: Behavior normal.     ED Results / Procedures / Treatments   Labs (all labs ordered are listed, but only abnormal results are displayed) Labs Reviewed  URINALYSIS, ROUTINE W REFLEX MICROSCOPIC - Abnormal;  Notable for the following components:      Result Value   Color, Urine AMBER (*)    APPearance CLOUDY (*)    Leukocytes,Ua LARGE (*)    WBC, UA >50 (*)    Bacteria, UA RARE (*)    Non Squamous Epithelial 0-5 (*)    All other components within normal limits  BASIC METABOLIC PANEL - Abnormal; Notable for the following components:   Sodium 134 (*)    Chloride 95 (*)    BUN 49 (*)    Creatinine, Ser 2.39 (*)    GFR calc non Af Amer 19 (*)    GFR calc Af Amer 22 (*)    All other components within normal limits  CBC - Abnormal; Notable for the following components:   RBC 3.81 (*)    Hemoglobin 10.7 (*)    HCT 33.5 (*)    RDW 15.9 (*)    All other components within normal limits  HEPATIC FUNCTION PANEL - Abnormal; Notable for the following components:   Albumin 2.9 (*)    Total Bilirubin 1.4 (*)    Indirect Bilirubin 1.2 (*)    All other components within normal limits  URINE CULTURE  SARS CORONAVIRUS 2 BY RT PCR (HOSPITAL ORDER, [facility])  URINE CULTURE  URINALYSIS, ROUTINE W REFLEX MICROSCOPIC    EKG None  Radiology DG Chest 2 View  Result Date: 09/15/2019 CLINICAL DATA:  Flank pain, fever EXAM: CHEST - 2 VIEW COMPARISON:  08/10/2019 FINDINGS: The heart size and mediastinal contours are stable. Atherosclerotic calcification of the aortic knob. No focal airspace consolidation, pleural effusion, or pneumothorax. Advanced degenerative changes of the right shoulder. Partially visualized lumbar spondylosis. IMPRESSION: No active cardiopulmonary disease. Electronically Signed   By: Davina Poke D.O.   On: 09/15/2019 16:16    Procedures Procedures (including critical care time)  Medications Ordered in ED Medications  lidocaine (LIDODERM) 5 % 1 patch (1 patch  Transdermal Patch Applied 09/15/19 1547)    ED Course  I have reviewed the triage vital signs and the nursing notes.  Pertinent labs & imaging results that were available during my care of  the patient were reviewed by me and considered in my medical decision making (see chart for details).    MDM Rules/Calculators/A&P                     Patient with history of DVT times two here for evaluation of right flank pain, low-grade temperatures. She is non-toxic appearing on evaluation. BMP demonstrates elevation in her creatinine when compared to baseline. Initially urinalysis is difficult to interpret given epithelial cells present, will re-collect with in and out cath. Given her history of prior DVT and not currently anticoagulated will obtain VQ scan to rule out PE. Patient care transferred pending repeat UA as well as VQ scan.  Final Clinical Impression(s) / ED Diagnoses Final diagnoses:  None    Rx / DC Orders ED Discharge Orders    None       Quintella Reichert, MD 09/15/19 1658

## 2019-09-15 NOTE — H&P (Signed)
History and Physical    [name] [id number] DOB: [birth year] DOA: 09/15/2019  PCP: Lorretta Harp, MD Patient coming from: Home  Chief Complaint: Right flank pain  HPI: [name] is a 79 y.o. female with medical history significant of mild CAD, chronic combined systolic and diastolic CHF (EF 45 to [id number] on echo done 06/20/2019), CKD stage III, GERD, hypertension, history of PE and DVT currently not on anticoagulation, prothrombin gene mutation, stroke, RLS, iron deficiency anemia, hemochromatosis presenting with a chief complaint of right flank pain. Patient reports 63-month history of right flank pain.  States she was seen in the hospital previously and told she did not have kidney stones based on her CT scan.  For the past 1 week the pain has been worse and she is taking her home Percocet on a scheduled basis.  Also reports temperature of 100.2 to 100.4 F for the past 2 days.  States sometimes she drinks up to 3 L of fluid and still makes no urine.  Denies any back pain or injuries to her back/flank region.  States her flank pain is sharp in character and 10 out of 10 in intensity.  She does not experience pain at rest but whenever she gets up in the bed or twists the pain starts again.  Also reports having diarrhea for the first 2 weeks of this month after her physician prescribed her a high-dose medicine for a gout flare.  Since she stopped taking the medicine, the diarrhea has resolved.  Denies any nausea, vomiting, or abdominal pain.  She has been vaccinated for Covid.  Denies cough or shortness of breath.  ED Course: Afebrile.  No leukocytosis or signs of sepsis.  BUN 49, creatinine 2.3.  Initial UA showed signs of infection but was not clean-catch.  Repeat urinalysis did not show any signs of infection.  Urine culture pending.  No significant elevation of LFTs.  SARS-CoV-2 PCR test negative.  Chest x-ray showing no active cardiopulmonary disease.  V/Q was done in the ED to rule out  PE as a possible explanation for right-sided flank pain -low probability scan.  Review of Systems:  All systems reviewed and apart from history of presenting illness, are negative.  Past Medical History:  Diagnosis Date  . Anemia   . Arthritis   . Bell's palsy   . CHF (congestive heart failure) ([place])   . GERD (gastroesophageal reflux disease)   . Hemochromatosis   . History of hiatal hernia   . Hypertension   . Iron deficiency anemia due to chronic blood loss 03/10/2017  . PE (pulmonary embolism)   . Pneumonia    "walking" pneumonia  . Prothrombin gene mutation ([place]) 05/30/2013  . Restless legs   . Right leg DVT ([place]) 05/30/2013  . Stroke [facility])    found on a MRI, she's not aware otherwise    Past Surgical History:  Procedure Laterality Date  . BACK SURGERY    . COLONOSCOPY    . RIGHT/LEFT HEART CATH AND CORONARY ANGIOGRAPHY N/A 07/12/2019   Procedure: RIGHT/LEFT HEART CATH AND CORONARY ANGIOGRAPHY;  Surgeon: Troy Sine, MD;  Location: [place] [facility];  Service: Cardiovascular;  Laterality: N/A;  . SHOULDER SURGERY Bilateral      reports that she has never smoked. She has never used smokeless tobacco. She reports that she does not drink alcohol or use drugs.  Allergies  Allergen Reactions  . Morphine And Related Other (See Comments)    LARGER DOSES  OF MORPHINE CAUSES BODY TWITCHING  . Penicillins Anaphylaxis    Childhood reaction Has patient had a PCN reaction causing immediate rash, facial/tongue/throat swelling, SOB or lightheadedness with hypotension: Yes Has patient had a PCN reaction causing severe rash involving mucus membranes or skin necrosis: Unknown Has patient had a PCN reaction that required hospitalization: No Has patient had a PCN reaction occurring within the last 10 years: no (5 or 79 yrs old) If all of the above answers are "NO", then may proceed with Cephalosporin use.   . Sulfa Antibiotics Other (See Comments)    Unknown childhood reaction     Family History  Problem Relation Age of Onset  . Congestive Heart Failure Mother   . Pancreatic cancer Father     Prior to Admission medications   Medication Sig Start Date End Date Taking? Authorizing Provider  aspirin EC 81 MG tablet Take 162 mg by mouth daily.   Yes [provider]  Biotin 5000 MCG TABS Take 5,000 mcg by mouth every morning.   Yes [provider]  calcium carbonate (CALCIUM 600) 600 MG TABS tablet Take 600 mg by mouth daily with breakfast.   Yes [provider]  cholecalciferol (VITAMIN D3) 25 MCG (1000 UNIT) tablet Take 1,000 Units by mouth 2 (two) times daily.    Yes [provider]  citalopram (CELEXA) 20 MG tablet Take 20 mg by mouth at bedtime.  08/05/11  Yes [provider]  colchicine 0.6 MG tablet Take 1 tablet (0.6 mg total) by mouth daily. Gout flare up. Patient taking differently: Take 0.6 mg by mouth daily as needed (gout flare).  12/02/16  Yes Love, Ivan Anchors, PA-C  cycloSPORINE (RESTASIS) 0.05 % ophthalmic emulsion Place 1 drop into both eyes 2 (two) times daily.   Yes [provider]  furosemide (LASIX) 80 MG tablet Take 1 tablet (80 mg total) by mouth daily. 07/13/19 07/12/20 Yes Oswald Hillock, MD  losartan (COZAAR) 50 MG tablet Take 1 tablet (50 mg total) by mouth daily. 07/13/19  Yes Oswald Hillock, MD  metoprolol succinate (TOPROL-XL) 25 MG 24 hr tablet Take 1 tablet (25 mg total) by mouth daily. 07/14/19  Yes Oswald Hillock, MD  Multiple Vitamin (MULTIVITAMIN WITH MINERALS) TABS tablet Take 1 tablet by mouth daily. Centrum Silver   Yes [provider]  Omega-3 Fatty Acids (FISH OIL) 1200 MG CAPS Take 1,200 mg by mouth daily.   Yes [provider]  omeprazole (PRILOSEC) 20 MG capsule Take 20 mg by mouth daily. 07/04/19  Yes [provider]  oxyCODONE-acetaminophen (PERCOCET/ROXICET) 5-325 MG tablet Take 1 tablet by mouth every 6 (six) hours as needed (pain).  03/10/17  Yes [provider]  polycarbophil (FIBERCON) 625 MG tablet Take 625 mg by mouth in the morning and at bedtime.   Yes [provider]  pramipexole (MIRAPEX) 0.5 MG tablet Take 0.5 mg by mouth at bedtime.    Yes [provider]  temazepam (RESTORIL) 15 MG capsule Take 1 capsule (15 mg total) by mouth at bedtime. 12/02/16  Yes Love, Ivan Anchors, PA-C  Turmeric Curcumin 500 MG CAPS Take 500 mg by mouth at bedtime.    Yes [provider]  VITAMIN E PO Take 1 capsule by mouth at bedtime.   Yes [provider]  atorvastatin (LIPITOR) 40 MG tablet Take 1 tablet (40 mg total) by mouth daily at 6 PM. Patient not taking: Reported on 09/15/2019 07/13/19   Oswald Hillock,  MD  diclofenac Sodium (VOLTAREN) 1 % GEL Apply 4 g topically 4 (four) times daily. Patient not taking: Reported on 09/15/2019 08/10/19   Blanchie Dessert, MD  Hypromellose (ARTIFICIAL TEARS OP) Place 1 drop into both eyes 3 (three) times daily as needed (dry eyes).    [provider]    Physical Exam: Vitals:   09/15/19 1134 09/15/19 1547 09/15/19 2130  BP: 124/77 (!) 105/46 136/74  Pulse: 71 61 62  Resp: 18    Temp: 98.6 F (37 C)    TempSrc: Oral    SpO2: 98% 98%   Weight: 77.6 kg    Height: 5' 2.5" (1.588 m)      Physical Exam  Constitutional: She is oriented to person, place, and time. She appears well-developed and well-nourished. No distress.  HENT:  Head: Normocephalic.  Dry mucous membranes  Eyes: Right eye exhibits no discharge. Left eye exhibits no discharge.  Cardiovascular: Normal rate, regular rhythm and intact distal pulses.  Pulmonary/Chest: Effort normal. No respiratory distress. She has no wheezes. She has no rales.  Abdominal: Soft. Bowel sounds are normal. She exhibits no distension. There is no abdominal tenderness. There is no guarding.  Musculoskeletal:        General: No edema.     Cervical back: Neck supple.     Comments: No CVA tenderness No tenderness on palpation  of the spine  Neurological: She is alert and oriented to person, place, and time.  Skin: Skin is warm and dry. She is not diaphoretic.    Labs on Admission: I have personally reviewed following labs and imaging studies  CBC: Recent Labs  Lab 09/15/19 1140  WBC 8.8  HGB 10.7*  HCT 33.5*  MCV 87.9  PLT [id number]   Basic Metabolic Panel: Recent Labs  Lab 09/15/19 1140  NA 134*  K 3.8  CL 95*  CO2 27  GLUCOSE 92  BUN 49*  CREATININE 2.39*  CALCIUM 9.4   GFR: Estimated Creatinine Clearance: 18.6 mL/min (A) (by C-G formula based on SCr of 2.39 mg/dL (H)). Liver Function Tests: Recent Labs  Lab 09/15/19 1528  AST 28  ALT 21  ALKPHOS 79  BILITOT 1.4*  PROT 6.5  ALBUMIN 2.9*   No results for input(s): LIPASE, AMYLASE in the last 168 hours. No results for input(s): AMMONIA in the last 168 hours. Coagulation Profile: No results for input(s): INR, PROTIME in the last 168 hours. Cardiac Enzymes: No results for input(s): CKTOTAL, CKMB, CKMBINDEX, TROPONINI in the last 168 hours. BNP (last 3 results) No results for input(s): PROBNP in the last 8760 hours. HbA1C: No results for input(s): HGBA1C in the last 72 hours. CBG: No results for input(s): GLUCAP in the last 168 hours. Lipid Profile: No results for input(s): CHOL, HDL, LDLCALC, TRIG, CHOLHDL, LDLDIRECT in the last 72 hours. Thyroid Function Tests: No results for input(s): TSH, T4TOTAL, FREET4, T3FREE, THYROIDAB in the last 72 hours. Anemia Panel: No results for input(s): VITAMINB12, FOLATE, FERRITIN, TIBC, IRON, RETICCTPCT in the last 72 hours. Urine analysis:    Component Value Date/Time   COLORURINE YELLOW 09/15/2019 [place] 09/15/2019 1552   LABSPEC 1.014 09/15/2019 1552   PHURINE 5.0 09/15/2019 1552   GLUCOSEU NEGATIVE 09/15/2019 1552   HGBUR NEGATIVE 09/15/2019 1552   BILIRUBINUR NEGATIVE 09/15/2019 1552   KETONESUR NEGATIVE 09/15/2019 1552   PROTEINUR NEGATIVE 09/15/2019 1552    UROBILINOGEN 0.2 11/24/2010 1201   NITRITE NEGATIVE 09/15/2019 [place] 09/15/2019 1552  Radiological Exams on Admission: DG Chest 2 View  Result Date: 09/15/2019 CLINICAL DATA:  Flank pain, fever EXAM: CHEST - 2 VIEW COMPARISON:  08/10/2019 FINDINGS: The heart size and mediastinal contours are stable. Atherosclerotic calcification of the aortic knob. No focal airspace consolidation, pleural effusion, or pneumothorax. Advanced degenerative changes of the right shoulder. Partially visualized lumbar spondylosis. IMPRESSION: No active cardiopulmonary disease. Electronically Signed   By: Davina Poke D.O.   On: 09/15/2019 16:16   NM Pulmonary Perfusion  Result Date: 09/15/2019 CLINICAL DATA:  flank pain, hx/o PE EXAM: NUCLEAR MEDICINE PERFUSION LUNG SCAN TECHNIQUE: Perfusion images were obtained in multiple projections after intravenous injection of radiopharmaceutical. Ventilation scans intentionally deferred if perfusion scan and chest x-ray adequate for interpretation during COVID 19 epidemic. RADIOPHARMACEUTICALS:  1.65 mCi Tc-48m MAA IV COMPARISON:  Chest radiograph earlier this day. Chest CTA 07/09/2019 FINDINGS: Homogeneous distribution of radiotracer throughout the lungs. There are no wedge-shaped or peripheral perfusion abnormalities to suggest pulmonary embolus. Cardiomegaly is seen on chest radiograph. IMPRESSION: Low probability for pulmonary embolus. Electronically Signed   By: Keith Rake M.D.   On: 09/15/2019 18:03    Assessment/Plan Principal Problem:   AKI (acute kidney injury) ([place]) Active Problems:   HTN (hypertension)   CHF (congestive heart failure) ([facility])   Right flank pain   Anemia  AKI on CKD stage III: Likely prerenal secondary to dehydration in setting of recent diarrhea and home diuretic plus ARB use.  Does appear dehydrated on exam with dry mucous membranes.  BUN 49, creatinine 2.3.  Creatinine was 2.0 during ED visit on 4/14 but previous  baseline around 1.0. -IV fluid hydration.  Monitor renal function and urine output.  Order renal ultrasound.  Check urine sodium, creatinine.  Avoid nephrotoxic agents/contrast.  Hold home Lasix and losartan.  Right flank pain: Ongoing for the past 2 months.  Appears musculoskeletal based on history.  Do not feel it is related to pyelonephritis as urinalysis is negative for pyuria or bacteriuria.  Afebrile here and labs showing no leukocytosis.  No CVA tenderness on exam.  She was seen in the ED on 4/14 for the same problem and CT abdomen pelvis done at that time revealed nonobstructive left nephrolithiasis which would not explain the patient's right-sided flank pain.  Gallbladder was normal on CT. -Renal ultrasound ordered as aboveI.  Patient is currently taking Percocet at home for this pain, will continue at this time.  Also ordered lidocaine patches.  PT evaluation.  Normocytic anemia: Hemoglobin 10.7, was 11.8 on labs done a month ago.  MCV 87.  Not endorsing any symptoms of GI bleed. -Order anemia panel  Chronic combined systolic and diastolic CHF: EF 45 to [id number] on echo done 06/20/2019.  No signs of volume overload at this time. -Hold home Lasix and losartan given AKI  Hypertension: Continue home metoprolol  GERD: Continue PPI  DVT prophylaxis: Subcutaneous heparin Code Status: Patient wishes to be full code. Family Communication: Husband at bedside. Disposition Plan: Status is: Observation  The patient remains OBS appropriate and will d/c before 2 midnights.  Dispo: The patient is from: Home              Anticipated d/c is to: Home              Anticipated d/c date is: 2 days              Patient currently is not medically stable to d/c.  The medical decision making on this patient  was of high complexity and the patient is at high risk for clinical deterioration, therefore this is a level 3 visit.  Shela Leff MD [facility]  If 7PM-7AM, please contact  night-coverage [email or web address]  09/15/2019, 9:56 PM

## 2019-09-15 NOTE — ED Provider Notes (Signed)
4:38 PM Care assumed from Dr. Ralene Bathe.  At time of transfer care, patient is awaiting results of VQ scan, repeat urinalysis prior to admission for AKI, possible pyelonephritis, flank pain, and fever.  7:38 PM VQ scan is returned showing low probability for pulmonary ballismus.  Repeat urinalysis shows no nitrites or leukocytes.  At this time, I have a lower suspicion for acute pyelonephritis given the clean urine that was reassuring.  I am still concerned about the patient's fever.  Covid test is negative.  Patient still complaining of right-sided flank and back pain that is up to 10 out of 10 in severity when she tries to ambulate or do anything.  She is concerned of the kidney function is worsening.   I had a shared decision made conversation with patient and her family about discharge and she is concerned about the kidney function worsening, her blood pressure being on the soft side now, the fever today, and 10 out of 10 pain.  Due to this and the previous team's concern that she will need admission, will call for admission for further management and rehydration for AKI.  Clinical Impression: 1. AKI (acute kidney injury) ([place])   2. Fever, unspecified fever cause   3. Right flank pain   4. Flank pain     Disposition: Admit  This note was prepared with assistance of Dragon voice recognition software. Occasional wrong-word or sound-a-like substitutions may have occurred due to the inherent limitations of voice recognition software.     Roise Emert, Gwenyth Allegra, MD 09/15/19 2330

## 2019-09-16 ENCOUNTER — Observation Stay (HOSPITAL_COMMUNITY): Payer: PPO

## 2019-09-16 DIAGNOSIS — F419 Anxiety disorder, unspecified: Secondary | ICD-10-CM | POA: Diagnosis present

## 2019-09-16 DIAGNOSIS — N183 Chronic kidney disease, stage 3 unspecified: Secondary | ICD-10-CM | POA: Diagnosis present

## 2019-09-16 DIAGNOSIS — M109 Gout, unspecified: Secondary | ICD-10-CM | POA: Diagnosis present

## 2019-09-16 DIAGNOSIS — Z86718 Personal history of other venous thrombosis and embolism: Secondary | ICD-10-CM | POA: Diagnosis not present

## 2019-09-16 DIAGNOSIS — D6852 Prothrombin gene mutation: Secondary | ICD-10-CM | POA: Diagnosis present

## 2019-09-16 DIAGNOSIS — I5042 Chronic combined systolic (congestive) and diastolic (congestive) heart failure: Secondary | ICD-10-CM | POA: Diagnosis present

## 2019-09-16 DIAGNOSIS — Z8673 Personal history of transient ischemic attack (TIA), and cerebral infarction without residual deficits: Secondary | ICD-10-CM | POA: Diagnosis not present

## 2019-09-16 DIAGNOSIS — R0600 Dyspnea, unspecified: Secondary | ICD-10-CM | POA: Diagnosis not present

## 2019-09-16 DIAGNOSIS — E669 Obesity, unspecified: Secondary | ICD-10-CM | POA: Diagnosis present

## 2019-09-16 DIAGNOSIS — F0390 Unspecified dementia without behavioral disturbance: Secondary | ICD-10-CM | POA: Diagnosis present

## 2019-09-16 DIAGNOSIS — R509 Fever, unspecified: Secondary | ICD-10-CM | POA: Diagnosis not present

## 2019-09-16 DIAGNOSIS — K449 Diaphragmatic hernia without obstruction or gangrene: Secondary | ICD-10-CM | POA: Diagnosis present

## 2019-09-16 DIAGNOSIS — K219 Gastro-esophageal reflux disease without esophagitis: Secondary | ICD-10-CM | POA: Diagnosis present

## 2019-09-16 DIAGNOSIS — I5043 Acute on chronic combined systolic (congestive) and diastolic (congestive) heart failure: Secondary | ICD-10-CM | POA: Diagnosis not present

## 2019-09-16 DIAGNOSIS — B951 Streptococcus, group B, as the cause of diseases classified elsewhere: Secondary | ICD-10-CM | POA: Diagnosis present

## 2019-09-16 DIAGNOSIS — N390 Urinary tract infection, site not specified: Secondary | ICD-10-CM | POA: Diagnosis present

## 2019-09-16 DIAGNOSIS — N179 Acute kidney failure, unspecified: Secondary | ICD-10-CM | POA: Diagnosis present

## 2019-09-16 DIAGNOSIS — I130 Hypertensive heart and chronic kidney disease with heart failure and stage 1 through stage 4 chronic kidney disease, or unspecified chronic kidney disease: Secondary | ICD-10-CM | POA: Diagnosis present

## 2019-09-16 DIAGNOSIS — Z20822 Contact with and (suspected) exposure to covid-19: Secondary | ICD-10-CM | POA: Diagnosis present

## 2019-09-16 DIAGNOSIS — I159 Secondary hypertension, unspecified: Secondary | ICD-10-CM | POA: Diagnosis not present

## 2019-09-16 DIAGNOSIS — M79675 Pain in left toe(s): Secondary | ICD-10-CM | POA: Diagnosis not present

## 2019-09-16 DIAGNOSIS — E876 Hypokalemia: Secondary | ICD-10-CM | POA: Diagnosis present

## 2019-09-16 DIAGNOSIS — D631 Anemia in chronic kidney disease: Secondary | ICD-10-CM | POA: Diagnosis present

## 2019-09-16 DIAGNOSIS — Z8249 Family history of ischemic heart disease and other diseases of the circulatory system: Secondary | ICD-10-CM | POA: Diagnosis not present

## 2019-09-16 DIAGNOSIS — I447 Left bundle-branch block, unspecified: Secondary | ICD-10-CM | POA: Diagnosis present

## 2019-09-16 DIAGNOSIS — E860 Dehydration: Secondary | ICD-10-CM | POA: Diagnosis present

## 2019-09-16 DIAGNOSIS — R109 Unspecified abdominal pain: Secondary | ICD-10-CM | POA: Diagnosis present

## 2019-09-16 DIAGNOSIS — Z86711 Personal history of pulmonary embolism: Secondary | ICD-10-CM | POA: Diagnosis not present

## 2019-09-16 DIAGNOSIS — G2581 Restless legs syndrome: Secondary | ICD-10-CM | POA: Diagnosis present

## 2019-09-16 LAB — BASIC METABOLIC PANEL
Anion gap: 10 (ref 5–15)
BUN: 45 mg/dL — ABNORMAL HIGH (ref 8–23)
CO2: 25 mmol/L (ref 22–32)
Calcium: 8.8 mg/dL — ABNORMAL LOW (ref 8.9–10.3)
Chloride: 99 mmol/L (ref 98–111)
Creatinine, Ser: 1.62 mg/dL — ABNORMAL HIGH (ref 0.44–1.00)
GFR calc Af Amer: 35 mL/min — ABNORMAL LOW (ref >60)
GFR calc non Af Amer: 30 mL/min — ABNORMAL LOW (ref >60)
Glucose, Bld: 166 mg/dL — ABNORMAL HIGH (ref 70–99)
Potassium: 3.4 mmol/L — ABNORMAL LOW (ref 3.5–5.1)
Sodium: 134 mmol/L — ABNORMAL LOW (ref 135–145)

## 2019-09-16 LAB — FERRITIN: Ferritin: 155 ng/mL (ref 11–307)

## 2019-09-16 LAB — FOLATE: Folate: 48.6 ng/mL

## 2019-09-16 LAB — IRON AND TIBC
Iron: 15 ug/dL — ABNORMAL LOW (ref 28–170)
Saturation Ratios: 8 % — ABNORMAL LOW (ref 10.4–31.8)
TIBC: 188 ug/dL — ABNORMAL LOW (ref 250–450)
UIBC: 173 ug/dL

## 2019-09-16 LAB — URINE CULTURE: Culture: >=100000 — AB

## 2019-09-16 LAB — SODIUM, URINE, RANDOM: Sodium, Ur: <10 mmol/L

## 2019-09-16 LAB — RETICULOCYTES
Immature Retic Fract: 8.4 % (ref 2.3–15.9)
RBC.: 3.46 MIL/uL — ABNORMAL LOW (ref 3.87–5.11)
Retic Count, Absolute: 31.5 10*3/uL (ref 19.0–186.0)
Retic Ct Pct: 0.9 % (ref 0.4–3.1)

## 2019-09-16 LAB — CREATININE, URINE, RANDOM: Creatinine, Urine: 137.37 mg/dL

## 2019-09-16 LAB — VITAMIN B12: Vitamin B-12: 460 pg/mL (ref 180–914)

## 2019-09-16 MED ORDER — POTASSIUM CHLORIDE CRYS ER 20 MEQ PO TBCR
40.0000 meq | EXTENDED_RELEASE_TABLET | Freq: Once | ORAL | Status: AC
  Administered 2019-09-16: 40 meq via ORAL
  Filled 2019-09-16: qty 2

## 2019-09-16 MED ORDER — SODIUM CHLORIDE 0.9 % IV SOLN: INTRAVENOUS | Status: AC

## 2019-09-16 MED ORDER — SODIUM CHLORIDE 0.9 % IV SOLN
1.0000 g | INTRAVENOUS | Status: DC
  Administered 2019-09-16 – 2019-09-20 (×5): 1 g via INTRAVENOUS
  Filled 2019-09-16: qty 10
  Filled 2019-09-16: qty 1
  Filled 2019-09-16 (×3): qty 10
  Filled 2019-09-16: qty 1

## 2019-09-16 MED ORDER — DICLOFENAC SODIUM 1 % EX GEL
2.0000 g | Freq: Four times a day (QID) | CUTANEOUS | Status: DC
  Administered 2019-09-16 – 2019-09-21 (×14): 2 g via TOPICAL
  Filled 2019-09-16: qty 100

## 2019-09-16 NOTE — Evaluation (Signed)
Physical Therapy Evaluation Patient Details Name: [name] MRN: [id number] DOB: [birth year] Today's Date: 09/16/2019   History of Present Illness  Pt is a 79 y/o female admitted secondary to R flank pain. Pt found to have an AKI on CKD. Work-up regarding flank pain still pending. PMH including but not limited to CHF, HTN and stroke.    Clinical Impression  Pt presented supine in bed with HOB elevated, awake and willing to participate in therapy session. Prior to admission, pt reported that she ambulated with use of a rollator and was independent with ADLs. Pt lives with her spouse in a single level home with a couple of steps to enter. At the time of evaluation, pt overall at a supervision to min guard level with all functional mobility including hallway ambulation with use of a RW. Pt reporting minimal R flank pain throughout with no exacerbating movements. Pt would continue to benefit from skilled physical therapy services at this time while admitted and after d/c to address the below listed limitations in order to improve overall safety and independence with functional mobility.     Follow Up Recommendations Outpatient PT    Equipment Recommendations  None recommended by PT    Recommendations for Other Services       Precautions / Restrictions Precautions Precautions: Fall Precaution Comments: L foot drop at baseline Restrictions Weight Bearing Restrictions: No      Mobility  Bed Mobility Overal bed mobility: Needs Assistance Bed Mobility: Supine to Sit;Sit to Supine     Supine to sit: Supervision Sit to supine: Supervision   General bed mobility comments: no physical assistance needed  Transfers Overall transfer level: Needs assistance Equipment used: Rolling walker (2 wheeled) Transfers: Sit to/from Stand Sit to Stand: Supervision         General transfer comment: for safety  Ambulation/Gait Ambulation/Gait assistance: Min guard Gait Distance (Feet): 100  Feet Assistive device: Rolling walker (2 wheeled) Gait Pattern/deviations: Step-through pattern;Decreased stride length Gait velocity: decreased   General Gait Details: pt with slow, cautious but steady gait with RW; no LOB or need for physical assistance, min guard for safety. Pt with consistent L foot drop and utilized excessive hip and knee flexion to create an exaggerated step to avoid toe drag  Stairs            Wheelchair Mobility    Modified Rankin (Stroke Patients Only)       Balance Overall balance assessment: Needs assistance Sitting-balance support: Feet supported Sitting balance-Leahy Scale: Good     Standing balance support: During functional activity Standing balance-Leahy Scale: Fair                               Pertinent Vitals/Pain Pain Assessment: Faces Faces Pain Scale: Hurts a little bit Pain Location: R flank Pain Descriptors / Indicators: Guarding Pain Intervention(s): Monitored during session;Repositioned    Home Living Family/patient expects to be discharged to:: Private residence Living Arrangements: Spouse/significant other Available Help at Discharge: Family;Available 24 hours/day Type of Home: House Home Access: Stairs to enter Entrance Stairs-Rails: Psychiatric nurse of Steps: 2 Home Layout: One level Home Equipment: Walker - 4 wheels      Prior Function Level of Independence: Independent with assistive device(s)         Comments: ambulates with use of an rollator; does not wear AFO because it causes pain in her foot     Hand Dominance  Extremity/Trunk Assessment   Upper Extremity Assessment Upper Extremity Assessment: Overall WFL for tasks assessed    Lower Extremity Assessment Lower Extremity Assessment: Generalized weakness    Cervical / Trunk Assessment Cervical / Trunk Assessment: Kyphotic  Communication   Communication: No difficulties  Cognition Arousal/Alertness:  Awake/alert Behavior During Therapy: WFL for tasks assessed/performed Overall Cognitive Status: Within Functional Limits for tasks assessed                                        General Comments      Exercises     Assessment/Plan    PT Assessment Patient needs continued PT services  PT Problem List Decreased strength;Decreased coordination;Decreased mobility;Decreased balance;Decreased knowledge of use of DME;Decreased knowledge of precautions;Decreased safety awareness;Pain       PT Treatment Interventions DME instruction;Gait training;Stair training;Functional mobility training;Therapeutic activities;Therapeutic exercise;Balance training;Neuromuscular re-education;Patient/family education    PT Goals (Current goals can be found in the Care Plan section)  Acute Rehab PT Goals Patient Stated Goal: decrease pain PT Goal Formulation: With patient Time For Goal Achievement: 09/30/19 Potential to Achieve Goals: Good    Frequency Min 3X/week   Barriers to discharge        Co-evaluation               AM-PAC PT "6 Clicks" Mobility  Outcome Measure Help needed turning from your back to your side while in a flat bed without using bedrails?: None Help needed moving from lying on your back to sitting on the side of a flat bed without using bedrails?: None Help needed moving to and from a bed to a chair (including a wheelchair)?: None Help needed standing up from a chair using your arms (e.g., wheelchair or bedside chair)?: None Help needed to walk in hospital room?: None Help needed climbing 3-5 steps with a railing? : A Little 6 Click Score: 23    End of Session   Activity Tolerance: Patient tolerated treatment well Patient left: in bed;with call bell/phone within reach;with chair alarm set;with family/visitor present Nurse Communication: Mobility status PT Visit Diagnosis: Other abnormalities of gait and mobility (R26.89);Pain Pain - Right/Left:  Right Pain - part of body: (flank)    Time: [id number] PT Time Calculation (min) (ACUTE ONLY): 26 min   Charges:   PT Evaluation $PT Eval Moderate Complexity: 1 Mod PT Treatments $Gait Training: 8-22 mins        Anastasio Champion, DPT  Acute Rehabilitation Services Pager [phone] Office [place] 09/16/2019, 4:58 PM

## 2019-09-16 NOTE — Progress Notes (Signed)
Progress Note    [name]  [id number] DOB: 06-06-1940  DOA: 09/15/2019 PCP: Lorretta Harp, MD    Brief Narrative:     Medical records reviewed and are as summarized below:  [name] is an 79 y.o. female with medical history significant of mild CAD, chronic combined systolic and diastolic CHF (EF 45 to [id number] on echo done 06/20/2019), CKD stage III, GERD, hypertension, history of PE and DVT currently not on anticoagulation, prothrombin gene mutation, stroke, RLS, iron deficiency anemia, hemochromatosis presenting with a chief complaint of right flank pain. Patient reports 30-month history of right flank pain.  States she was seen in the hospital previously and told she did not have kidney stones based on her CT scan.  For the past 1 week the pain has been worse and she is taking her home Percocet on a scheduled basis.  Also reports temperature of 100.2 to 100.4 F for the past 2 days.  States sometimes she drinks up to 3 L of fluid and still makes no urine.  Assessment/Plan:   Principal Problem:   AKI (acute kidney injury) ([place]) Active Problems:   HTN (hypertension)   CHF (congestive heart failure) (H[facility])   Right flank pain   Anemia   AKI on CKD stage III: Likely prerenal secondary to dehydration in setting of recent diarrhea and home diuretic plus ARB use.  Does appear dehydrated on exam with dry mucous membranes.   -IV fluid hydration.  -Monitor renal function and urine output.   Right flank pain:  -? pyelonephritis as urinalysis has > 50 WBCs.  - Afebrile here but does state she has fevers at home  -She was seen in the ED on 4/14 for the same problem and CT abdomen pelvis done at that time revealed nonobstructive left nephrolithiasis which would not explain the patient's right-sided flank pain.  Gallbladder was normal on CT. -Renal ultrasound: shows thinning on right pole so ? From chronic pyelo? -culture pending -will start rocephin as has ecoli on last  culture that was pan-sensitive  Normocytic anemia with h/o hemochromatosis: Hemoglobin 10.7, was 11.8 on labs done a month ago.  MCV 87.  Not endorsing any symptoms of GI bleed. -ferritin pending  Chronic combined systolic and diastolic CHF: EF 45 to [id number] on echo done 06/20/2019.  No signs of volume overload at this time. -Hold home Lasix and losartan given AKI  Hypertension: Continue home metoprolol  GERD: Continue PPI  obesity Body mass index is 32.46 kg/m.  Left toe pain -recently treated for gout with prednisone taper -check x ray as swollen  Hypokalemia -replete  Family Communication/Anticipated D/C date and plan/Code Status   DVT prophylaxis: Lovenox ordered. Code Status: Full Code.  Disposition Plan: Status is: Observation  The patient will require care spanning > 2 midnights and should be moved to inpatient because: IV treatments appropriate due to intensity of illness or inability to take PO  Dispo: The patient is from: Home              Anticipated d/c is to: Home              Anticipated d/c date is: 2 days              Patient currently is not medically stable to d/c.         Medical Consultants:    None.   Subjective:   C/o left toe pain  Objective:    Vitals:  09/15/19 2224 09/16/19 0252 09/16/19 0630 09/16/19 1025  BP: (!) 121/54 (!) 120/50 (!) 126/56 (!) 104/50  Pulse: 75 66 63 60  Resp: 16 14 16 14   Temp: 98.1 F (36.7 C) 97.8 F (36.6 C) 98.2 F (36.8 C) 98 F (36.7 C)  TempSrc: Oral Oral Oral   SpO2: 98% 96% 97% 95%  Weight: 81.8 kg     Height:        Intake/Output Summary (Last 24 hours) at 09/16/2019 1035 Last data filed at 09/16/2019 0815 Gross per 24 hour  Intake 1455 ml  Output 800 ml  Net 655 ml   Filed Weights   09/15/19 1134 09/15/19 2224  Weight: 77.6 kg 81.8 kg    Exam:  General: Appearance:    Obese female in no acute distress     Lungs:     Clear to auscultation bilaterally, respirations unlabored    Heart:    Normal heart rate. Normal rhythm. No murmurs, rubs, or gallops.   MS:   All extremities are intact. -- left 1st toe swollen  Neurologic:   Awake, alert, oriented x 3. Left ankle supination     Data Reviewed:   I have personally reviewed following labs and imaging studies:  Labs: Labs show the following:   Basic Metabolic Panel: Recent Labs  Lab 09/15/19 1140 09/16/19 0820  NA 134* 134*  K 3.8 3.4*  CL 95* 99  CO2 27 25  GLUCOSE 92 166*  BUN 49* 45*  CREATININE 2.39* 1.62*  CALCIUM 9.4 8.8*   GFR Estimated Creatinine Clearance: 28.2 mL/min (A) (by C-G formula based on SCr of 1.62 mg/dL (H)). Liver Function Tests: Recent Labs  Lab 09/15/19 1528  AST 28  ALT 21  ALKPHOS 79  BILITOT 1.4*  PROT 6.5  ALBUMIN 2.9*   No results for input(s): LIPASE, AMYLASE in the last 168 hours. No results for input(s): AMMONIA in the last 168 hours. Coagulation profile No results for input(s): INR, PROTIME in the last 168 hours.  CBC: Recent Labs  Lab 09/15/19 1140  WBC 8.8  HGB 10.7*  HCT 33.5*  MCV 87.9  PLT 161   Cardiac Enzymes: No results for input(s): CKTOTAL, CKMB, CKMBINDEX, TROPONINI in the last 168 hours. BNP (last 3 results) No results for input(s): PROBNP in the last 8760 hours. CBG: No results for input(s): GLUCAP in the last 168 hours. D-Dimer: No results for input(s): DDIMER in the last 72 hours. Hgb A1c: No results for input(s): HGBA1C in the last 72 hours. Lipid Profile: No results for input(s): CHOL, HDL, LDLCALC, TRIG, CHOLHDL, LDLDIRECT in the last 72 hours. Thyroid function studies: No results for input(s): TSH, T4TOTAL, T3FREE, THYROIDAB in the last 72 hours.  Invalid input(s): FREET3 Anemia work up: Recent Labs    09/16/19 0820  RETICCTPCT 0.9   Sepsis Labs: Recent Labs  Lab 09/15/19 1140  WBC 8.8    Microbiology Recent Results (from the past 240 hour(s))  SARS Coronavirus 2 by RT PCR (hospital order, performed in [facility] hospital lab) Nasopharyngeal Nasopharyngeal Swab     Status: None   Collection Time: 09/15/19  3:38 PM   Specimen: Nasopharyngeal Swab  Result Value Ref Range Status   SARS Coronavirus 2 NEGATIVE NEGATIVE Final    Comment: (NOTE) SARS-CoV-2 target nucleic acids are NOT DETECTED. The SARS-CoV-2 RNA is generally detectable in upper and lower respiratory specimens during the acute phase of infection. The lowest concentration of SARS-CoV-2 viral copies this assay can  detect is 250 copies / mL. A negative result does not preclude SARS-CoV-2 infection and should not be used as the sole basis for treatment or other patient management decisions.  A negative result may occur with improper specimen collection / handling, submission of specimen other than nasopharyngeal swab, presence of viral mutation(s) within the areas targeted by this assay, and inadequate number of viral copies (<250 copies / mL). A negative result must be combined with clinical observations, patient history, and epidemiological information. Fact Sheet for Patients:   StrictlyIdeas.no Fact Sheet for Healthcare Providers: BankingDealers.co.za This test is not yet approved or cleared  by the [place] FDA and has been authorized for detection and/or diagnosis of SARS-CoV-2 by FDA under an Emergency Use Authorization (EUA).  This EUA will remain in effect (meaning this test can be used) for the duration of the COVID-19 declaration under Section 564(b)(1) of the Act, 21 U.S.C. section 360bbb-3(b)(1), unless the authorization is terminated or revoked sooner. Performed at [facility], [place] [address] [place],  [address]     Procedures and diagnostic studies:  DG Chest 2 View  Result Date: 09/15/2019 CLINICAL DATA:  Flank pain, fever EXAM: CHEST - 2 VIEW COMPARISON:  08/10/2019 FINDINGS: The heart size and mediastinal contours are stable. Atherosclerotic  calcification of the aortic knob. No focal airspace consolidation, pleural effusion, or pneumothorax. Advanced degenerative changes of the right shoulder. Partially visualized lumbar spondylosis. IMPRESSION: No active cardiopulmonary disease. Electronically Signed   By: Davina Poke D.O.   On: 09/15/2019 16:16   NM Pulmonary Perfusion  Result Date: 09/15/2019 CLINICAL DATA:  flank pain, hx/o PE EXAM: NUCLEAR MEDICINE PERFUSION LUNG SCAN TECHNIQUE: Perfusion images were obtained in multiple projections after intravenous injection of radiopharmaceutical. Ventilation scans intentionally deferred if perfusion scan and chest x-ray adequate for interpretation during COVID 19 epidemic. RADIOPHARMACEUTICALS:  1.65 mCi Tc-39m MAA IV COMPARISON:  Chest radiograph earlier this day. Chest CTA 07/09/2019 FINDINGS: Homogeneous distribution of radiotracer throughout the lungs. There are no wedge-shaped or peripheral perfusion abnormalities to suggest pulmonary embolus. Cardiomegaly is seen on chest radiograph. IMPRESSION: Low probability for pulmonary embolus. Electronically Signed   By: Keith Rake M.D.   On: 09/15/2019 18:03   US RENAL  Result Date: 09/16/2019 CLINICAL DATA:  Initial evaluation for acute flank pain. EXAM: RENAL / URINARY TRACT ULTRASOUND COMPLETE COMPARISON:  Prior CT from 08/10/2019. FINDINGS: Right Kidney: Renal measurements: 8.5 x 3.5 x 4.3 cm = volume: 65.6 mL. Echogenicity within normal limits. Focal cortical thinning/scarring noted at the upper pole. No nephrolithiasis or hydronephrosis. No focal renal mass. Left Kidney: Renal measurements: 8.1 x 4.6 x 4.3 cm = volume: 82.3 mL. Echogenicity within normal limits. No nephrolithiasis or hydronephrosis. No focal renal mass. Bladder: Appears normal for degree of bladder distention. Bilateral jets are visualized. Other: None. IMPRESSION: 1. No hydronephrosis or other acute abnormality. No sonographic evidence for nephrolithiasis. 2. Focal  cortical thinning/scarring at the upper pole of the right kidney. Electronically Signed   By: Jeannine Boga M.D.   On: 09/16/2019 00:56    Medications:   . aspirin EC  81 mg Oral Daily  . calcium carbonate  1 tablet Oral Q breakfast  . cholecalciferol  1,000 Units Oral BID  . citalopram  20 mg Oral QHS  . cycloSPORINE  1 drop Both Eyes BID  . diclofenac Sodium  2 g Topical QID  . heparin  5,000 Units Subcutaneous Q8H  . metoprolol succinate  25 mg Oral Daily  .  multivitamin with minerals  1 tablet Oral Daily  . omega-3 acid ethyl esters  1 g Oral Daily  . pantoprazole  40 mg Oral Daily  . polycarbophil  625 mg Oral BID  . pramipexole  0.5 mg Oral QHS  . temazepam  15 mg Oral QHS   Continuous Infusions: . sodium chloride    . cefTRIAXone (ROCEPHIN)  IV       LOS: 0 days   Geradine Girt  [facility]   How to contact the [facility] Attending or Consulting provider [place] or covering provider during after hours [place], for this patient?  1. Check the care team in [facility] and look for a) attending/consulting [facility] provider listed and b) the [facility] team listed 2. Log into [email or web address] and use [place] universal password to access. If you do not have the password, please contact the hospital operator. 3. Locate the [facility] provider you are looking for under [facility] and page to a number that you can be directly reached. 4. If you still have difficulty reaching the provider, please page the [facility] (Director on Call) for the Hospitalists listed on amion for assistance.  09/16/2019, 10:35 AM

## 2019-09-17 LAB — CBC
HCT: 28.1 % — ABNORMAL LOW (ref 36.0–46.0)
Hemoglobin: 9 g/dL — ABNORMAL LOW (ref 12.0–15.0)
MCH: 28.3 pg (ref 26.0–34.0)
MCHC: 32 g/dL (ref 30.0–36.0)
MCV: 88.4 fL (ref 80.0–100.0)
Platelets: 159 10*3/uL (ref 150–400)
RBC: 3.18 MIL/uL — ABNORMAL LOW (ref 3.87–5.11)
RDW: 15.9 % — ABNORMAL HIGH (ref 11.5–15.5)
WBC: 6.2 10*3/uL (ref 4.0–10.5)
nRBC: 0 % (ref 0.0–0.2)

## 2019-09-17 LAB — BASIC METABOLIC PANEL
Anion gap: 9 (ref 5–15)
BUN: 39 mg/dL — ABNORMAL HIGH (ref 8–23)
CO2: 25 mmol/L (ref 22–32)
Calcium: 8.9 mg/dL (ref 8.9–10.3)
Chloride: 102 mmol/L (ref 98–111)
Creatinine, Ser: 1.31 mg/dL — ABNORMAL HIGH (ref 0.44–1.00)
GFR calc Af Amer: 45 mL/min — ABNORMAL LOW (ref >60)
GFR calc non Af Amer: 39 mL/min — ABNORMAL LOW (ref >60)
Glucose, Bld: 95 mg/dL (ref 70–99)
Potassium: 4 mmol/L (ref 3.5–5.1)
Sodium: 136 mmol/L (ref 135–145)

## 2019-09-17 LAB — URIC ACID: Uric Acid, Serum: 12.1 mg/dL — ABNORMAL HIGH (ref 2.5–7.1)

## 2019-09-17 MED ORDER — COLCHICINE 0.6 MG PO TABS
0.6000 mg | ORAL_TABLET | Freq: Every day | ORAL | Status: DC
  Administered 2019-09-17 – 2019-09-21 (×5): 0.6 mg via ORAL
  Filled 2019-09-17 (×5): qty 1

## 2019-09-17 MED ORDER — FUROSEMIDE 40 MG PO TABS
40.0000 mg | ORAL_TABLET | Freq: Every day | ORAL | Status: DC
  Administered 2019-09-17 – 2019-09-18 (×2): 40 mg via ORAL
  Filled 2019-09-17 (×3): qty 1

## 2019-09-17 MED ORDER — PREDNISONE 20 MG PO TABS
30.0000 mg | ORAL_TABLET | Freq: Every day | ORAL | Status: AC
  Administered 2019-09-17 – 2019-09-21 (×5): 30 mg via ORAL
  Filled 2019-09-17 (×5): qty 1

## 2019-09-17 NOTE — Plan of Care (Signed)
  Problem: Education: Goal: Knowledge of disease and its progression will improve Outcome: Progressing   Problem: Pain Managment: Goal: General experience of comfort will improve Outcome: Progressing   

## 2019-09-17 NOTE — Progress Notes (Signed)
Progress Note    [name]  [id number] DOB: [birth year]  DOA: 09/15/2019 PCP: Lorretta Harp, MD    Brief Narrative:     Medical records reviewed and are as summarized below:  [name] is an 79 y.o. female with medical history significant of mild CAD, chronic combined systolic and diastolic CHF (EF 45 to A999333 on echo done 06/20/2019), CKD stage III, GERD, hypertension, history of PE and DVT currently not on anticoagulation, prothrombin gene mutation, stroke, RLS, iron deficiency anemia, hemochromatosis presenting with a chief complaint of right flank pain. Patient reports 60-month history of right flank pain.  States she was seen in the hospital previously and told she did not have kidney stones based on her CT scan.  For the past 1 week the pain has been worse and she is taking her home Percocet on a scheduled basis.  Also reports temperature of 100.2 to 100.4 F for the past 2 days.  States sometimes she drinks up to 3 L of fluid and still makes no urine.  Assessment/Plan:   Principal Problem:   AKI (acute kidney injury) ([place]) Active Problems:   HTN (hypertension)   CHF (congestive heart failure) (HCC)   Right flank pain   Anemia   AKI on CKD stage III: Likely prerenal secondary to dehydration in setting of recent diarrhea and home diuretic plus ARB use.  Does appear dehydrated on exam with dry mucous membranes.   -IV fluid hydration.  -Monitor renal function and urine output.   Right flank pain: now with pain on both flanks -? pyelonephritis as urinalysis has > 50 WBCs.  - Afebrile here but does state she has fevers at home  -She was seen in the ED on 4/14 for the same problem and CT abdomen pelvis done at that time revealed nonobstructive left nephrolithiasis which would not explain the patient's right-sided flank pain.  Gallbladder was normal on CT. -Renal ultrasound: shows thinning on right pole so ? From chronic pyelo? -culture shows: >100000 group b  strep  Normocytic anemia with h/o hemochromatosis: Hemoglobin 10.7, was 11.8 on labs done a month ago.  MCV 87.  Not endorsing any symptoms of GI bleed. -ferritin pending  Chronic combined systolic and diastolic CHF: EF 45 to A999333 on echo done 06/20/2019.  No signs of volume overload at this time. -resume lasix  Hypertension: Continue home metoprolol  GERD: Continue PPI  obesity Body mass index is 32.46 kg/m.  Left toe pain due to gout flare -Uric acid elevated -recently treated for gout with prednisone taper; did not tolerate colchicine due to diarrhea but will attempt to use colchicine again as well as steroids -X-ray does not show fracture -once recovered, will need preventative medication  Hypokalemia -repleted  Family Communication/Anticipated D/C date and plan/Code Status   DVT prophylaxis: Lovenox ordered. Code Status: Full Code.  Disposition Plan: Status is: Inpatient  Remains inpatient appropriate because:IV treatments appropriate due to intensity of illness or inability to take PO and Inpatient level of care appropriate due to severity of illness   Dispo: The patient is from: Home              Anticipated d/c is to: Home              Anticipated d/c date is: 2 days              Patient currently is not medically stable to d/c.        Medical  Consultants:    None.   Subjective:   Left first toe more swollen today and painful Flank pain on both sides now  Objective:    Vitals:   09/16/19 1841 09/16/19 2253 09/17/19 0511 09/17/19 0907  BP: (!) 114/53 (!) 124/51 (!) 132/55 (!) 128/51  Pulse: 68 64 68 71  Resp: 16 16 14 18   Temp: 98.9 F (37.2 C) 98.4 F (36.9 C) 99.1 F (37.3 C) 98.5 F (36.9 C)  TempSrc:  Oral Oral Oral  SpO2: 95% 98% 92% 95%  Weight:      Height:        Intake/Output Summary (Last 24 hours) at 09/17/2019 1102 Last data filed at 09/17/2019 0845 Gross per 24 hour  Intake 850 ml  Output 700 ml  Net 150 ml   Filed  Weights   09/15/19 1134 09/15/19 2224  Weight: 77.6 kg 81.8 kg    Exam:  General: Appearance:    Obese female in no acute distress     Lungs:     Clear to auscultation bilaterally, respirations unlabored  Heart:    Normal heart rate. Normal rhythm. No murmurs, rubs, or gallops.   MS:   Right first toes swollen  Neurologic:   Awake, alert, oriented x 3. No apparent focal neurological           defect.      Data Reviewed:   I have personally reviewed following labs and imaging studies:  Labs: Labs show the following:   Basic Metabolic Panel: Recent Labs  Lab 09/15/19 1140 09/15/19 1140 09/16/19 0820 09/17/19 0455  NA 134*  --  134* 136  K 3.8   < > 3.4* 4.0  CL 95*  --  99 102  CO2 27  --  25 25  GLUCOSE 92  --  166* 95  BUN 49*  --  45* 39*  CREATININE 2.39*  --  1.62* 1.31*  CALCIUM 9.4  --  8.8* 8.9   < > = values in this interval not displayed.   GFR Estimated Creatinine Clearance: 34.9 mL/min (A) (by C-G formula based on SCr of 1.31 mg/dL (H)). Liver Function Tests: Recent Labs  Lab 09/15/19 1528  AST 28  ALT 21  ALKPHOS 79  BILITOT 1.4*  PROT 6.5  ALBUMIN 2.9*   No results for input(s): LIPASE, AMYLASE in the last 168 hours. No results for input(s): AMMONIA in the last 168 hours. Coagulation profile No results for input(s): INR, PROTIME in the last 168 hours.  CBC: Recent Labs  Lab 09/15/19 1140 09/17/19 0455  WBC 8.8 6.2  HGB 10.7* 9.0*  HCT 33.5* 28.1*  MCV 87.9 88.4  PLT 161 159   Cardiac Enzymes: No results for input(s): CKTOTAL, CKMB, CKMBINDEX, TROPONINI in the last 168 hours. BNP (last 3 results) No results for input(s): PROBNP in the last 8760 hours. CBG: No results for input(s): GLUCAP in the last 168 hours. D-Dimer: No results for input(s): DDIMER in the last 72 hours. Hgb A1c: No results for input(s): HGBA1C in the last 72 hours. Lipid Profile: No results for input(s): CHOL, HDL, LDLCALC, TRIG, CHOLHDL, LDLDIRECT in the  last 72 hours. Thyroid function studies: No results for input(s): TSH, T4TOTAL, T3FREE, THYROIDAB in the last 72 hours.  Invalid input(s): FREET3 Anemia work up: Recent Labs    09/16/19 0820  VITAMINB12 460  FOLATE 48.6  FERRITIN 155  TIBC 188*  IRON 15*  RETICCTPCT 0.9   Sepsis Labs: Recent Labs  Lab 09/15/19 1140 09/17/19 0455  WBC 8.8 6.2    Microbiology Recent Results (from the past 240 hour(s))  Urine culture     Status: Abnormal   Collection Time: 09/15/19  3:18 PM   Specimen: Urine, Random  Result Value Ref Range Status   Specimen Description URINE, RANDOM  Final   Special Requests NONE  Final   Culture (A)  Final    >=100,000 COLONIES/mL GROUP B STREP(S.AGALACTIAE)ISOLATED TESTING AGAINST S. AGALACTIAE NOT ROUTINELY PERFORMED DUE TO PREDICTABILITY OF AMP/PEN/VAN SUSCEPTIBILITY. Performed at [facility], [place] [address], [place], [place] [address]    Report Status 09/16/2019 FINAL  Final  SARS Coronavirus 2 by RT PCR (hospital order, performed in [facility] hospital lab) Nasopharyngeal Nasopharyngeal Swab     Status: None   Collection Time: 09/15/19  3:38 PM   Specimen: Nasopharyngeal Swab  Result Value Ref Range Status   SARS Coronavirus 2 NEGATIVE NEGATIVE Final    Comment: (NOTE) SARS-CoV-2 target nucleic acids are NOT DETECTED. The SARS-CoV-2 RNA is generally detectable in upper and lower respiratory specimens during the acute phase of infection. The lowest concentration of SARS-CoV-2 viral copies this assay can detect is 250 copies / mL. A negative result does not preclude SARS-CoV-2 infection and should not be used as the sole basis for treatment or other patient management decisions.  A negative result may occur with improper specimen collection / handling, submission of specimen other than nasopharyngeal swab, presence of viral mutation(s) within the areas targeted by this assay, and inadequate number of viral copies (<250 copies / mL). A  negative result must be combined with clinical observations, patient history, and epidemiological information. Fact Sheet for Patients:   StrictlyIdeas.no Fact Sheet for Healthcare Providers: BankingDealers.co.za This test is not yet approved or cleared  by the [place] FDA and has been authorized for detection and/or diagnosis of SARS-CoV-2 by FDA under an Emergency Use Authorization (EUA).  This EUA will remain in effect (meaning this test can be used) for the duration of the COVID-19 declaration under Section 564(b)(1) of the Act, 21 U.S.C. section 360bbb-3(b)(1), unless the authorization is terminated or revoked sooner. Performed at [facility], [place] [address] [place], [place] [address]     Procedures and diagnostic studies:  DG Chest 2 View  Result Date: 09/15/2019 CLINICAL DATA:  Flank pain, fever EXAM: CHEST - 2 VIEW COMPARISON:  08/10/2019 FINDINGS: The heart size and mediastinal contours are stable. Atherosclerotic calcification of the aortic knob. No focal airspace consolidation, pleural effusion, or pneumothorax. Advanced degenerative changes of the right shoulder. Partially visualized lumbar spondylosis. IMPRESSION: No active cardiopulmonary disease. Electronically Signed   By: Davina Poke D.O.   On: 09/15/2019 16:16   NM Pulmonary Perfusion  Result Date: 09/15/2019 CLINICAL DATA:  flank pain, hx/o PE EXAM: NUCLEAR MEDICINE PERFUSION LUNG SCAN TECHNIQUE: Perfusion images were obtained in multiple projections after intravenous injection of radiopharmaceutical. Ventilation scans intentionally deferred if perfusion scan and chest x-ray adequate for interpretation during COVID 19 epidemic. RADIOPHARMACEUTICALS:  1.65 mCi Tc-65m MAA IV COMPARISON:  Chest radiograph earlier this day. Chest CTA 07/09/2019 FINDINGS: Homogeneous distribution of radiotracer throughout the lungs. There are no wedge-shaped or peripheral  perfusion abnormalities to suggest pulmonary embolus. Cardiomegaly is seen on chest radiograph. IMPRESSION: Low probability for pulmonary embolus. Electronically Signed   By: Keith Rake M.D.   On: 09/15/2019 18:03   US RENAL  Result Date: 09/16/2019 CLINICAL DATA:  Initial evaluation for acute flank  pain. EXAM: RENAL / URINARY TRACT ULTRASOUND COMPLETE COMPARISON:  Prior CT from 08/10/2019. FINDINGS: Right Kidney: Renal measurements: 8.5 x 3.5 x 4.3 cm = volume: 65.6 mL. Echogenicity within normal limits. Focal cortical thinning/scarring noted at the upper pole. No nephrolithiasis or hydronephrosis. No focal renal mass. Left Kidney: Renal measurements: 8.1 x 4.6 x 4.3 cm = volume: 82.3 mL. Echogenicity within normal limits. No nephrolithiasis or hydronephrosis. No focal renal mass. Bladder: Appears normal for degree of bladder distention. Bilateral jets are visualized. Other: None. IMPRESSION: 1. No hydronephrosis or other acute abnormality. No sonographic evidence for nephrolithiasis. 2. Focal cortical thinning/scarring at the upper pole of the right kidney. Electronically Signed   By: Jeannine Boga M.D.   On: 09/16/2019 00:56   DG Toe Great Left  Result Date: 09/16/2019 CLINICAL DATA:  Left great pain EXAM: LEFT GREAT TOE COMPARISON:  None. FINDINGS: No acute osseous evaluate seen. Specifically, no marginal erosions in this patient with history of gout. No fracture is seen. Mild degenerative changes of the 1st MTP joint. Visualized soft tissues are within normal limits. IMPRESSION: No acute osseous abnormality is seen. Electronically Signed   By: Julian Hy M.D.   On: 09/16/2019 11:35    Medications:   . aspirin EC  81 mg Oral Daily  . calcium carbonate  1 tablet Oral Q breakfast  . cholecalciferol  1,000 Units Oral BID  . citalopram  20 mg Oral QHS  . cycloSPORINE  1 drop Both Eyes BID  . diclofenac Sodium  2 g Topical QID  . heparin  5,000 Units Subcutaneous Q8H  .  metoprolol succinate  25 mg Oral Daily  . multivitamin with minerals  1 tablet Oral Daily  . omega-3 acid ethyl esters  1 g Oral Daily  . pantoprazole  40 mg Oral Daily  . polycarbophil  625 mg Oral BID  . pramipexole  0.5 mg Oral QHS  . predniSONE  30 mg Oral Q breakfast  . temazepam  15 mg Oral QHS   Continuous Infusions: . cefTRIAXone (ROCEPHIN)  IV 1 g (09/16/19 1219)     LOS: 1 day   Geradine Girt  [facility]   How to contact the [facility] Attending or Consulting provider [place] or covering provider during after hours [place], for this patient?  1. Check the care team in [facility] and look for a) attending/consulting TRH provider listed and b) the [facility] team listed 2. Log into [email or web address] and use [place]'s universal password to access. If you do not have the password, please contact the hospital operator. 3. Locate the [facility] provider you are looking for under [facility] and page to a number that you can be directly reached. 4. If you still have difficulty reaching the provider, please page the [facility] (Director on Call) for the Hospitalists listed on amion for assistance.  09/17/2019, 11:02 AM

## 2019-09-18 DIAGNOSIS — M109 Gout, unspecified: Secondary | ICD-10-CM

## 2019-09-18 DIAGNOSIS — R109 Unspecified abdominal pain: Secondary | ICD-10-CM

## 2019-09-18 LAB — BASIC METABOLIC PANEL
Anion gap: 6 (ref 5–15)
BUN: 35 mg/dL — ABNORMAL HIGH (ref 8–23)
CO2: 27 mmol/L (ref 22–32)
Calcium: 9.1 mg/dL (ref 8.9–10.3)
Chloride: 104 mmol/L (ref 98–111)
Creatinine, Ser: 1.18 mg/dL — ABNORMAL HIGH (ref 0.44–1.00)
GFR calc Af Amer: 51 mL/min — ABNORMAL LOW (ref >60)
GFR calc non Af Amer: 44 mL/min — ABNORMAL LOW (ref >60)
Glucose, Bld: 112 mg/dL — ABNORMAL HIGH (ref 70–99)
Potassium: 3.7 mmol/L (ref 3.5–5.1)
Sodium: 137 mmol/L (ref 135–145)

## 2019-09-18 LAB — CBC
HCT: 29 % — ABNORMAL LOW (ref 36.0–46.0)
Hemoglobin: 9.4 g/dL — ABNORMAL LOW (ref 12.0–15.0)
MCH: 28.5 pg (ref 26.0–34.0)
MCHC: 32.4 g/dL (ref 30.0–36.0)
MCV: 87.9 fL (ref 80.0–100.0)
Platelets: 178 10*3/uL (ref 150–400)
RBC: 3.3 MIL/uL — ABNORMAL LOW (ref 3.87–5.11)
RDW: 15.7 % — ABNORMAL HIGH (ref 11.5–15.5)
WBC: 7.3 10*3/uL (ref 4.0–10.5)
nRBC: 0 % (ref 0.0–0.2)

## 2019-09-18 NOTE — Progress Notes (Signed)
Progress Note    [name]  [id number] DOB: 1941-01-19  DOA: 09/15/2019 PCP: Lorretta Harp, MD    Brief Narrative:     Medical records reviewed and are as summarized below:  [name] is an 79 y.o. female with medical history significant of mild CAD, chronic combined systolic and diastolic CHF (EF 45 to A999333 on echo done 06/20/2019), CKD stage III, GERD, hypertension, history of PE and DVT currently not on anticoagulation, prothrombin gene mutation, stroke, RLS, iron deficiency anemia, hemochromatosis presenting with a chief complaint of right flank pain. Patient reports 64-month history of right flank pain.  States she was seen in the hospital previously and told she did not have kidney stones based on her CT scan.  For the past 1 week the pain has been worse and she is taking her home Percocet on a scheduled basis.  Also reports temperature of 100.2 to 100.4 F for the past 2 days.  States sometimes she drinks up to 3 L of fluid and still makes no urine.  Assessment/Plan:   Principal Problem:   AKI (acute kidney injury) ([place]) Active Problems:   HTN (hypertension)   CHF (congestive heart failure) (HCC)   Right flank pain   Anemia   AKI on CKD stage III: Likely prerenal secondary to dehydration in setting of recent diarrhea and home diuretic plus ARB use.  Does appear dehydrated on exam with dry mucous membranes.   -IV fluid hydration.  -Monitor renal function and urine output.   Right flank pain: now with pain on both flanks -? pyelonephritis as urinalysis has > 50 WBCs.  - Afebrile here but does state she has fevers at home  -She was seen in the ED on 4/14 for the same problem and CT abdomen pelvis done at that time revealed nonobstructive left nephrolithiasis which would not explain the patient's right-sided flank pain.  Gallbladder was normal on CT. -Renal ultrasound: shows thinning on right pole so ? From chronic pyelo? -culture shows: >100000 group b  strep -pain resolved on 5/23 after IV abx- has not needed Percocet thus far today  Normocytic anemia with h/o hemochromatosis: Hemoglobin 10.7, was 11.8 on labs done a month ago.  MCV 87.  Not endorsing any symptoms of GI bleed. -ferritin normal  Chronic combined systolic and diastolic CHF: EF 45 to A999333 on echo done 06/20/2019.  No signs of volume overload at this time. -resume lasix  Hypertension: Continue home metoprolol  GERD: Continue PPI  obesity Body mass index is 32.46 kg/m.  Left toe pain due to gout flare -Uric acid elevated -recently treated for gout with prednisone taper; did not tolerate colchicine due to diarrhea but will attempt to use colchicine again as well as steroids -X-ray does not show fracture -once recovered, will need preventative medication  Hypokalemia -repleted  Family Communication/Anticipated D/C date and plan/Code Status   DVT prophylaxis: Lovenox ordered. Code Status: Full Code.  Disposition Plan: Status is: Inpatient  Remains inpatient appropriate because:IV treatments appropriate due to intensity of illness or inability to take PO and Inpatient level of care appropriate due to severity of illness   Dispo: The patient is from: Home              Anticipated d/c is to: Home              Anticipated d/c date is: 2 days              Patient currently  is not medically stable to d/c.        Medical Consultants:    None.   Subjective:   Pain in toe better but still has swelling No pain in flank   Objective:    Vitals:   09/17/19 1657 09/17/19 2058 09/18/19 0445 09/18/19 0942  BP: (!) 142/69 (!) 124/51 (!) 154/70 (!) 143/86  Pulse: 73 61 (!) 57 70  Resp: 18 16 16 20   Temp: 99.1 F (37.3 C) 97.8 F (36.6 C) 97.7 F (36.5 C) 98.5 F (36.9 C)  TempSrc: Oral Oral Oral Oral  SpO2: 96% 96% 95% 95%  Weight:  81.8 kg    Height:        Intake/Output Summary (Last 24 hours) at 09/18/2019 1331 Last data filed at 09/18/2019  0900 Gross per 24 hour  Intake 680 ml  Output 100 ml  Net 580 ml   Filed Weights   09/15/19 1134 09/15/19 2224 09/17/19 2058  Weight: 77.6 kg 81.8 kg 81.8 kg    Exam:   General: Appearance:    Obese female in no acute distress     Lungs:     Clear to auscultation bilaterally, respirations unlabored  Heart:    Normal heart rate. Normal rhythm. No murmurs, rubs, or gallops.   MS:   All extremities are intact- left great toe with swelling  Neurologic:   Awake, alert, oriented x 3. No apparent focal neurological           defect.      Data Reviewed:   I have personally reviewed following labs and imaging studies:  Labs: Labs show the following:   Basic Metabolic Panel: Recent Labs  Lab 09/15/19 1140 09/15/19 1140 09/16/19 0820 09/16/19 0820 09/17/19 0455 09/18/19 0508  NA 134*  --  134*  --  136 137  K 3.8   < > 3.4*   < > 4.0 3.7  CL 95*  --  99  --  102 104  CO2 27  --  25  --  25 27  GLUCOSE 92  --  166*  --  95 112*  BUN 49*  --  45*  --  39* 35*  CREATININE 2.39*  --  1.62*  --  1.31* 1.18*  CALCIUM 9.4  --  8.8*  --  8.9 9.1   < > = values in this interval not displayed.   GFR Estimated Creatinine Clearance: 38.8 mL/min (A) (by C-G formula based on SCr of 1.18 mg/dL (H)). Liver Function Tests: Recent Labs  Lab 09/15/19 1528  AST 28  ALT 21  ALKPHOS 79  BILITOT 1.4*  PROT 6.5  ALBUMIN 2.9*   No results for input(s): LIPASE, AMYLASE in the last 168 hours. No results for input(s): AMMONIA in the last 168 hours. Coagulation profile No results for input(s): INR, PROTIME in the last 168 hours.  CBC: Recent Labs  Lab 09/15/19 1140 09/17/19 0455 09/18/19 0508  WBC 8.8 6.2 7.3  HGB 10.7* 9.0* 9.4*  HCT 33.5* 28.1* 29.0*  MCV 87.9 88.4 87.9  PLT 161 159 178   Cardiac Enzymes: No results for input(s): CKTOTAL, CKMB, CKMBINDEX, TROPONINI in the last 168 hours. BNP (last 3 results) No results for input(s): PROBNP in the last 8760 hours. CBG: No  results for input(s): GLUCAP in the last 168 hours. D-Dimer: No results for input(s): DDIMER in the last 72 hours. Hgb A1c: No results for input(s): HGBA1C in the last 72 hours. Lipid Profile: No  results for input(s): CHOL, HDL, LDLCALC, TRIG, CHOLHDL, LDLDIRECT in the last 72 hours. Thyroid function studies: No results for input(s): TSH, T4TOTAL, T3FREE, THYROIDAB in the last 72 hours.  Invalid input(s): FREET3 Anemia work up: Recent Labs    09/16/19 0820  VITAMINB12 460  FOLATE 48.6  FERRITIN 155  TIBC 188*  IRON 15*  RETICCTPCT 0.9   Sepsis Labs: Recent Labs  Lab 09/15/19 1140 09/17/19 0455 09/18/19 0508  WBC 8.8 6.2 7.3    Microbiology Recent Results (from the past 240 hour(s))  Urine culture     Status: Abnormal   Collection Time: 09/15/19  3:18 PM   Specimen: Urine, Random  Result Value Ref Range Status   Specimen Description URINE, RANDOM  Final   Special Requests NONE  Final   Culture (A)  Final    >=100,000 COLONIES/mL GROUP B STREP(S.AGALACTIAE)ISOLATED TESTING AGAINST S. AGALACTIAE NOT ROUTINELY PERFORMED DUE TO PREDICTABILITY OF AMP/PEN/VAN SUSCEPTIBILITY. Performed at [facility], [place] [address] [place], [place] [address]    Report Status 09/16/2019 FINAL  Final  SARS Coronavirus 2 by RT PCR (hospital order, performed in [facility] hospital lab) Nasopharyngeal Nasopharyngeal Swab     Status: None   Collection Time: 09/15/19  3:38 PM   Specimen: Nasopharyngeal Swab  Result Value Ref Range Status   SARS Coronavirus 2 NEGATIVE NEGATIVE Final    Comment: (NOTE) SARS-CoV-2 target nucleic acids are NOT DETECTED. The SARS-CoV-2 RNA is generally detectable in upper and lower respiratory specimens during the acute phase of infection. The lowest concentration of SARS-CoV-2 viral copies this assay can detect is 250 copies / mL. A negative result does not preclude SARS-CoV-2 infection and should not be used as the sole basis for treatment or  other patient management decisions.  A negative result may occur with improper specimen collection / handling, submission of specimen other than nasopharyngeal swab, presence of viral mutation(s) within the areas targeted by this assay, and inadequate number of viral copies (<250 copies / mL). A negative result must be combined with clinical observations, patient history, and epidemiological information. Fact Sheet for Patients:   StrictlyIdeas.no Fact Sheet for Healthcare Providers: BankingDealers.co.za This test is not yet approved or cleared  by the [place] FDA and has been authorized for detection and/or diagnosis of SARS-CoV-2 by FDA under an Emergency Use Authorization (EUA).  This EUA will remain in effect (meaning this test can be used) for the duration of the COVID-19 declaration under Section 564(b)(1) of the Act, 21 U.S.C. section 360bbb-3(b)(1), unless the authorization is terminated or revoked sooner. Performed at [facility], [place] [address], [place], [place] [address]     Procedures and diagnostic studies:  No results found.  Medications:   . aspirin EC  81 mg Oral Daily  . calcium carbonate  1 tablet Oral Q breakfast  . cholecalciferol  1,000 Units Oral BID  . citalopram  20 mg Oral QHS  . colchicine  0.6 mg Oral Daily  . cycloSPORINE  1 drop Both Eyes BID  . diclofenac Sodium  2 g Topical QID  . furosemide  40 mg Oral Daily  . heparin  5,000 Units Subcutaneous Q8H  . metoprolol succinate  25 mg Oral Daily  . multivitamin with minerals  1 tablet Oral Daily  . omega-3 acid ethyl esters  1 g Oral Daily  . pantoprazole  40 mg Oral Daily  . polycarbophil  625 mg Oral BID  . pramipexole  0.5 mg  Oral QHS  . predniSONE  30 mg Oral Q breakfast  . temazepam  15 mg Oral QHS   Continuous Infusions: . cefTRIAXone (ROCEPHIN)  IV 1 g (09/18/19 1047)     LOS: 2 days   Geradine Girt  [facility]   How to contact the [facility] Attending or Consulting provider [place] or covering provider during after hours [place], for this patient?  1. Check the care team in [facility] and look for a) attending/consulting TRH provider listed and b) the [facility] team listed 2. Log into [email or web address] and use 's universal password to access. If you do not have the password, please contact the hospital operator. 3. Locate the [facility] provider you are looking for under [facility] and page to a number that you can be directly reached. 4. If you still have difficulty reaching the provider, please page the [facility] (Director on Call) for the Hospitalists listed on amion for assistance.  09/18/2019, 1:31 PM

## 2019-09-19 ENCOUNTER — Inpatient Hospital Stay (HOSPITAL_COMMUNITY): Payer: PPO

## 2019-09-19 MED ORDER — FUROSEMIDE 10 MG/ML IJ SOLN
40.0000 mg | Freq: Once | INTRAMUSCULAR | Status: AC
  Administered 2019-09-19: 40 mg via INTRAVENOUS
  Filled 2019-09-19: qty 4

## 2019-09-19 MED ORDER — ALBUTEROL SULFATE (2.5 MG/3ML) 0.083% IN NEBU
2.5000 mg | INHALATION_SOLUTION | Freq: Once | RESPIRATORY_TRACT | Status: AC
  Administered 2019-09-19: 2.5 mg via RESPIRATORY_TRACT
  Filled 2019-09-19: qty 3

## 2019-09-19 NOTE — Plan of Care (Signed)
  Problem: Activity: Goal: Activity intolerance will improve Outcome: Progressing   Problem: Respiratory: Goal: Respiratory symptoms related to disease process will be avoided Outcome: Progressing   

## 2019-09-19 NOTE — Progress Notes (Signed)
Progress Note    [name]  [id number] DOB: [birth year]  DOA: 09/15/2019 PCP: Lorretta Harp, MD    Brief Narrative:     Medical records reviewed and are as summarized below:  [name] is an 79 y.o. female with medical history significant of mild CAD, chronic combined systolic and diastolic CHF (EF 45 to [id number] on echo done 06/20/2019), CKD stage III, GERD, hypertension, history of PE and DVT currently not on anticoagulation, prothrombin gene mutation, stroke, RLS, iron deficiency anemia, hemochromatosis presenting with a chief complaint of right flank pain. Patient reports 24-month history of right flank pain.  States she was seen in the hospital previously and told she did not have kidney stones based on her CT scan.  For the past 1 week the pain has been worse and she is taking her home Percocet on a scheduled basis.  Also reports temperature of 100.2 to 100.4 F for the past 2 days.  States sometimes she drinks up to 3 L of fluid and still makes no urine.  Assessment/Plan:   Principal Problem:   AKI (acute kidney injury) ([place]) Active Problems:   HTN (hypertension)   CHF (congestive heart failure) (HCC)   Right flank pain   Anemia   AKI on CKD stage III: Likely prerenal secondary to dehydration in setting of recent diarrhea and home diuretic plus ARB use.  Does appear dehydrated on exam with dry mucous membranes.   -IV fluid hydration stopped and suspect patient has 3rd spaced some with pulm edema -Monitor renal function and urine output.   Dyspnea -Patient able to carry on a very verbose conversation without appearing winded -Diminished breath sounds at bases will get x-ray -Plan to give IV Lasix x1 -Monitor closely  Right flank pain: now with pain on both flanks -? pyelonephritis as urinalysis has > 50 WBCs.  - Afebrile here but does state she has fevers at home  -She was seen in the ED on 4/14 for the same problem and CT abdomen pelvis done at that time  revealed nonobstructive left nephrolithiasis which would not explain the patient's right-sided flank pain.  Gallbladder was normal on CT. -Renal ultrasound: shows thinning on right pole so ? From chronic pyelo? -culture shows: >100000 group b strep -pain resolved on 5/23 after IV abx- has not needed Percocet thus far today  Normocytic anemia with h/o hemochromatosis: Hemoglobin 10.7, was 11.8 on labs done a month ago.  MCV 87.  Not endorsing any symptoms of GI bleed. -ferritin normal  Chronic combined systolic and diastolic CHF: EF 45 to [id number] on echo done 06/20/2019.  No signs of volume overload at this time. -resume lasix  Hypertension: Continue home metoprolol  GERD: Continue PPI  obesity Body mass index is 32.46 kg/m.  Left toe pain due to gout flare -Uric acid elevated -recently treated for gout with prednisone taper; did not tolerate colchicine due to diarrhea but will attempt to use colchicine again as well as steroids -X-ray does not show fracture -once recovered, will need preventative medication  Hypokalemia -repleted  Family Communication/Anticipated D/C date and plan/Code Status   DVT prophylaxis: Lovenox ordered. Code Status: Full Code.  Disposition Plan: Status is: Inpatient  Remains inpatient appropriate because:IV treatments appropriate due to intensity of illness or inability to take PO and Inpatient level of care appropriate due to severity of illness   Dispo: The patient is from: Home  Anticipated d/c is to: Home              Anticipated d/c date is: 2 days              Patient currently is not medically stable to d/c.        Medical Consultants:    None.   Subjective:   Patient states she developed left knee pain after IV antibiotics stopped yesterday Patient states that she short of breath when she bends over  Objective:    Vitals:   09/18/19 1704 09/18/19 2056 09/19/19 0557 09/19/19 0849  BP: (!) 146/73 (!) 154/71 (!)  157/64   Pulse: 64 67 65 78  Resp: 18 16 18    Temp: 98 F (36.7 C) 98.1 F (36.7 C) 97.7 F (36.5 C)   TempSrc: Oral Oral Oral   SpO2: 94% 95% 95%   Weight:      Height:        Intake/Output Summary (Last 24 hours) at 09/19/2019 0905 Last data filed at 09/19/2019 0557 Gross per 24 hour  Intake 600 ml  Output 0 ml  Net 600 ml   Filed Weights   09/15/19 1134 09/15/19 2224 09/17/19 2058  Weight: 77.6 kg 81.8 kg 81.8 kg    Exam:   General: Appearance:    Obese female in no acute distress     Lungs:     Diminished breath sounds  Heart:    Normal heart rate. Normal rhythm. No murmurs, rubs, or gallops.   MS:   Left toe swollen but not painful to touch  Neurologic:   Awake, alert, oriented x 3. No apparent focal neurological           defect.       Data Reviewed:   I have personally reviewed following labs and imaging studies:  Labs: Labs show the following:   Basic Metabolic Panel: Recent Labs  Lab 09/15/19 1140 09/15/19 1140 09/16/19 0820 09/16/19 0820 09/17/19 0455 09/18/19 0508  NA 134*  --  134*  --  136 137  K 3.8   < > 3.4*   < > 4.0 3.7  CL 95*  --  99  --  102 104  CO2 27  --  25  --  25 27  GLUCOSE 92  --  166*  --  95 112*  BUN 49*  --  45*  --  39* 35*  CREATININE 2.39*  --  1.62*  --  1.31* 1.18*  CALCIUM 9.4  --  8.8*  --  8.9 9.1   < > = values in this interval not displayed.   GFR Estimated Creatinine Clearance: 38.8 mL/min (A) (by C-G formula based on SCr of 1.18 mg/dL (H)). Liver Function Tests: Recent Labs  Lab 09/15/19 1528  AST 28  ALT 21  ALKPHOS 79  BILITOT 1.4*  PROT 6.5  ALBUMIN 2.9*   No results for input(s): LIPASE, AMYLASE in the last 168 hours. No results for input(s): AMMONIA in the last 168 hours. Coagulation profile No results for input(s): INR, PROTIME in the last 168 hours.  CBC: Recent Labs  Lab 09/15/19 1140 09/17/19 0455 09/18/19 0508  WBC 8.8 6.2 7.3  HGB 10.7* 9.0* 9.4*  HCT 33.5* 28.1* 29.0*  MCV  87.9 88.4 87.9  PLT 161 159 178   Cardiac Enzymes: No results for input(s): CKTOTAL, CKMB, CKMBINDEX, TROPONINI in the last 168 hours. BNP (last 3 results) No results for input(s): PROBNP in the  last 8760 hours. CBG: No results for input(s): GLUCAP in the last 168 hours. D-Dimer: No results for input(s): DDIMER in the last 72 hours. Hgb A1c: No results for input(s): HGBA1C in the last 72 hours. Lipid Profile: No results for input(s): CHOL, HDL, LDLCALC, TRIG, CHOLHDL, LDLDIRECT in the last 72 hours. Thyroid function studies: No results for input(s): TSH, T4TOTAL, T3FREE, THYROIDAB in the last 72 hours.  Invalid input(s): FREET3 Anemia work up: No results for input(s): VITAMINB12, FOLATE, FERRITIN, TIBC, IRON, RETICCTPCT in the last 72 hours. Sepsis Labs: Recent Labs  Lab 09/15/19 1140 09/17/19 0455 09/18/19 0508  WBC 8.8 6.2 7.3    Microbiology Recent Results (from the past 240 hour(s))  Urine culture     Status: Abnormal   Collection Time: 09/15/19  3:18 PM   Specimen: Urine, Random  Result Value Ref Range Status   Specimen Description URINE, RANDOM  Final   Special Requests NONE  Final   Culture (A)  Final    >=100,000 COLONIES/mL GROUP B STREP(S.AGALACTIAE)ISOLATED TESTING AGAINST S. AGALACTIAE NOT ROUTINELY PERFORMED DUE TO PREDICTABILITY OF AMP/PEN/VAN SUSCEPTIBILITY. Performed at [facility], [place] [address], [place], [place] [address]    Report Status 09/16/2019 FINAL  Final  SARS Coronavirus 2 by RT PCR (hospital order, performed in [facility] hospital lab) Nasopharyngeal Nasopharyngeal Swab     Status: None   Collection Time: 09/15/19  3:38 PM   Specimen: Nasopharyngeal Swab  Result Value Ref Range Status   SARS Coronavirus 2 NEGATIVE NEGATIVE Final    Comment: (NOTE) SARS-CoV-2 target nucleic acids are NOT DETECTED. The SARS-CoV-2 RNA is generally detectable in upper and lower respiratory specimens during the acute phase of infection. The  lowest concentration of SARS-CoV-2 viral copies this assay can detect is 250 copies / mL. A negative result does not preclude SARS-CoV-2 infection and should not be used as the sole basis for treatment or other patient management decisions.  A negative result may occur with improper specimen collection / handling, submission of specimen other than nasopharyngeal swab, presence of viral mutation(s) within the areas targeted by this assay, and inadequate number of viral copies (<250 copies / mL). A negative result must be combined with clinical observations, patient history, and epidemiological information. Fact Sheet for Patients:   StrictlyIdeas.no Fact Sheet for Healthcare Providers: BankingDealers.co.za This test is not yet approved or cleared  by the [place] FDA and has been authorized for detection and/or diagnosis of SARS-CoV-2 by FDA under an Emergency Use Authorization (EUA).  This EUA will remain in effect (meaning this test can be used) for the duration of the COVID-19 declaration under Section 564(b)(1) of the Act, 21 U.S.C. section 360bbb-3(b)(1), unless the authorization is terminated or revoked sooner. Performed at [facility], [place] [address], [place],  [address]     Procedures and diagnostic studies:  No results found.  Medications:   . aspirin EC  81 mg Oral Daily  . calcium carbonate  1 tablet Oral Q breakfast  . cholecalciferol  1,000 Units Oral BID  . citalopram  20 mg Oral QHS  . colchicine  0.6 mg Oral Daily  . cycloSPORINE  1 drop Both Eyes BID  . diclofenac Sodium  2 g Topical QID  . furosemide  40 mg Intravenous Once  . heparin  5,000 Units Subcutaneous Q8H  . metoprolol succinate  25 mg Oral Daily  . multivitamin with minerals  1 tablet Oral Daily  . omega-3 acid ethyl  esters  1 g Oral Daily  . pantoprazole  40 mg Oral Daily  . polycarbophil  625 mg Oral BID  . pramipexole  0.5 mg  Oral QHS  . predniSONE  30 mg Oral Q breakfast  . temazepam  15 mg Oral QHS   Continuous Infusions: . cefTRIAXone (ROCEPHIN)  IV 1 g (09/18/19 1047)     LOS: 3 days   Geradine Girt  [facility]   How to contact the [facility] Attending or Consulting provider [place] or covering provider during after hours [place], for this patient?  1. Check the care team in [facility] and look for a) attending/consulting [facility] provider listed and b) the [facility] team listed 2. Log into [email or web address] and use [facility] universal password to access. If you do not have the password, please contact the hospital operator. 3. Locate the [facility] provider you are looking for under [facility] and page to a number that you can be directly reached. 4. If you still have difficulty reaching the provider, please page the [facility] (Director on Call) for the Hospitalists listed on amion for assistance.  09/19/2019, 9:05 AM

## 2019-09-19 NOTE — Progress Notes (Signed)
Physical Therapy Treatment Patient Details Name: [name] MRN: [id number] DOB: September 28, 1940 Today's Date: 09/19/2019    History of Present Illness Pt is a 80 y/o female admitted secondary to R flank pain. Pt found to have an AKI on CKD. Work-up regarding flank pain still pending. PMH including but not limited to CHF, HTN and stroke.    PT Comments    Continuing work on functional mobility and activity tolerance;  Making good progress towards goals, ambualting greater distance and initiated stair training; She was able to demonstrate how she and her husband manage the stairs with rail and RW, and it looks like they have a sequence and manage well    Follow Up Recommendations  Outpatient PT     Equipment Recommendations  None recommended by PT    Recommendations for Other Services       Precautions / Restrictions Precautions Precautions: Fall Precaution Comments: L foot drop at baseline Restrictions Weight Bearing Restrictions: No    Mobility  Bed Mobility Overal bed mobility: Needs Assistance Bed Mobility: Supine to Sit     Supine to sit: Supervision     General bed mobility comments: no physical assistance needed  Transfers Overall transfer level: Needs assistance Equipment used: Rolling walker (2 wheeled) Transfers: Sit to/from Stand Sit to Stand: Supervision         General transfer comment: for safety  Ambulation/Gait Ambulation/Gait assistance: Min guard Gait Distance (Feet): 150 Feet Assistive device: Rolling walker (2 wheeled) Gait Pattern/deviations: Step-through pattern;Decreased stride length Gait velocity: decreased   General Gait Details: pt with slow, cautious but steady gait with RW; no LOB or need for physical assistance, min guard for safety. Pt with consistent L foot drop and utilized excessive hip and knee flexion to create an exaggerated step to avoid toe drag   Stairs Stairs: Yes Stairs assistance: Min assist Stair Management: One  rail Right;Forwards;With walker Number of Stairs: 4 General stair comments: Pt showed me how she and her husband go up and down teh steps with her holding  rail and having the RW one step above her with L hand on the L grip of the RW and her husband steadying her; managed well   Wheelchair Mobility    Modified Rankin (Stroke Patients Only)       Balance     Sitting balance-Leahy Scale: Good       Standing balance-Leahy Scale: Fair                              Cognition Arousal/Alertness: Awake/alert Behavior During Therapy: WFL for tasks assessed/performed Overall Cognitive Status: Within Functional Limits for tasks assessed                                        Exercises      General Comments        Pertinent Vitals/Pain Pain Assessment: Faces Faces Pain Scale: Hurts a little bit Pain Location: R flank Pain Descriptors / Indicators: Guarding Pain Intervention(s): Monitored during session    Home Living                      Prior Function            PT Goals (current goals can now be found in the care plan section) Acute Rehab PT Goals Patient  Stated Goal: decrease pain PT Goal Formulation: With patient Time For Goal Achievement: 09/30/19 Potential to Achieve Goals: Good Progress towards PT goals: Progressing toward goals    Frequency    Min 3X/week      PT Plan Current plan remains appropriate    Co-evaluation              AM-PAC PT "6 Clicks" Mobility   Outcome Measure  Help needed turning from your back to your side while in a flat bed without using bedrails?: None Help needed moving from lying on your back to sitting on the side of a flat bed without using bedrails?: None Help needed moving to and from a bed to a chair (including a wheelchair)?: None Help needed standing up from a chair using your arms (e.g., wheelchair or bedside chair)?: None Help needed to walk in hospital room?: None Help  needed climbing 3-5 steps with a railing? : A Little 6 Click Score: 23    End of Session Equipment Utilized During Treatment: Gait belt Activity Tolerance: Patient tolerated treatment well Patient left: in chair;with call bell/phone within reach;with chair alarm set Nurse Communication: Mobility status PT Visit Diagnosis: Other abnormalities of gait and mobility (R26.89);Pain Pain - Right/Left: Right Pain - part of body: (flank)     Time: [id number] PT Time Calculation (min) (ACUTE ONLY): 27 min  Charges:  $Gait Training: 23-37 mins                     Roney Marion, [place]  Acute Rehabilitation Services Pager [phone] Office [place] 09/19/2019, 5:01 PM

## 2019-09-20 LAB — BASIC METABOLIC PANEL
Anion gap: 11 (ref 5–15)
BUN: 34 mg/dL — ABNORMAL HIGH (ref 8–23)
CO2: 28 mmol/L (ref 22–32)
Calcium: 9.1 mg/dL (ref 8.9–10.3)
Chloride: 104 mmol/L (ref 98–111)
Creatinine, Ser: 1.07 mg/dL — ABNORMAL HIGH (ref 0.44–1.00)
GFR calc Af Amer: 57 mL/min — ABNORMAL LOW (ref >60)
GFR calc non Af Amer: 49 mL/min — ABNORMAL LOW (ref >60)
Glucose, Bld: 87 mg/dL (ref 70–99)
Potassium: 3.7 mmol/L (ref 3.5–5.1)
Sodium: 143 mmol/L (ref 135–145)

## 2019-09-20 LAB — CBC
HCT: 26.9 % — ABNORMAL LOW (ref 36.0–46.0)
Hemoglobin: 8.9 g/dL — ABNORMAL LOW (ref 12.0–15.0)
MCH: 29 pg (ref 26.0–34.0)
MCHC: 33.1 g/dL (ref 30.0–36.0)
MCV: 87.6 fL (ref 80.0–100.0)
Platelets: 226 10*3/uL (ref 150–400)
RBC: 3.07 MIL/uL — ABNORMAL LOW (ref 3.87–5.11)
RDW: 15.7 % — ABNORMAL HIGH (ref 11.5–15.5)
WBC: 6.8 10*3/uL (ref 4.0–10.5)
nRBC: 0 % (ref 0.0–0.2)

## 2019-09-20 MED ORDER — ALBUTEROL SULFATE (2.5 MG/3ML) 0.083% IN NEBU
2.5000 mg | INHALATION_SOLUTION | RESPIRATORY_TRACT | Status: DC | PRN
  Administered 2019-09-20: 2.5 mg via RESPIRATORY_TRACT
  Filled 2019-09-20: qty 3

## 2019-09-20 MED ORDER — ENOXAPARIN SODIUM 40 MG/0.4ML ~~LOC~~ SOLN
40.0000 mg | SUBCUTANEOUS | Status: DC
  Administered 2019-09-20 – 2019-09-21 (×2): 40 mg via SUBCUTANEOUS
  Filled 2019-09-20 (×2): qty 0.4

## 2019-09-20 MED ORDER — HYDROXYZINE HCL 10 MG PO TABS: 10.0000 mg | ORAL_TABLET | Freq: Three times a day (TID) | ORAL | Status: DC | PRN

## 2019-09-20 MED ORDER — FUROSEMIDE 10 MG/ML IJ SOLN
40.0000 mg | Freq: Once | INTRAMUSCULAR | Status: AC
  Administered 2019-09-20: 40 mg via INTRAVENOUS
  Filled 2019-09-20: qty 4

## 2019-09-20 MED ORDER — ASPIRIN EC 81 MG PO TBEC
162.0000 mg | DELAYED_RELEASE_TABLET | Freq: Every day | ORAL | Status: DC
  Administered 2019-09-20 – 2019-09-21 (×2): 162 mg via ORAL
  Filled 2019-09-20 (×2): qty 2

## 2019-09-20 MED ORDER — SODIUM CHLORIDE 0.9 % IV SOLN
510.0000 mg | Freq: Once | INTRAVENOUS | Status: AC
  Administered 2019-09-20: 510 mg via INTRAVENOUS
  Filled 2019-09-20: qty 17

## 2019-09-20 NOTE — Progress Notes (Signed)
Progress Note    [name]  [id number] DOB: Sep 13, 1940  DOA: 09/15/2019 PCP: Lorretta Harp, MD    Brief Narrative:     Medical records reviewed and are as summarized below:  [name] is an 79 y.o. female with medical history significant of mild CAD, chronic combined systolic and diastolic CHF (EF 45 to [id number] on echo done 06/20/2019), CKD stage III, GERD, hypertension, history of PE and DVT currently not on anticoagulation, prothrombin gene mutation, stroke, RLS, iron deficiency anemia, hemochromatosis presenting with a chief complaint of right flank pain. Patient reports 12-month history of right flank pain.  States she was seen in the hospital previously and told she did not have kidney stones based on her CT scan.  For the past 1 week the pain has been worse and she is taking her home Percocet on a scheduled basis.  Also reports temperature of 100.2 to 100.4 F for the past 2 days.  States sometimes she drinks up to 3 L of fluid and still makes no urine.   Assessment/Plan:   Principal Problem:   AKI (acute kidney injury) ([place]) Active Problems:   HTN (hypertension)   CHF (congestive heart failure) (HCC)   Right flank pain   Anemia   AKI on CKD stage III: Likely prerenal secondary to dehydration in setting of recent diarrhea and home diuretic plus ARB use. -IV fluid hydration stopped and suspect patient has 3rd spaced some with pulm edema -Monitor renal function and urine output.   Severe anxiety with memory impairment-- ? Early dementia -very anxious which is driving her SOB and other issues -due to age will avoid xanax/ativan-- trial of cognitive behavioral adjustments and PRN hydroxyzine   Dyspnea -Patient able to carry on a very verbose conversation without appearing winded -related to anxiety -x ray free of pulm edema/infiltrate -encouraged OOB and incentive spirometry  Right flank pain: now with pain on both flanks: group b strep -? pyelonephritis  as urinalysis has > 50 WBCs.  - Afebrile here but does state she has fevers at home  -She was seen in the ED on 4/14 for the same problem and CT abdomen pelvis done at that time revealed nonobstructive left nephrolithiasis which would not explain the patient's right-sided flank pain.  Gallbladder was normal on CT. -Renal ultrasound: shows thinning on right pole so ? From chronic pyelo? -culture shows: >100000 group b strep -pain resolved on 5/23 after IV abx- has not needed Percocet since 5/22  Normocytic anemia with h/o hemochromatosis: Hemoglobin 10.7, was 11.8 on labs done a month ago.  MCV 87.  Not endorsing any symptoms of GI bleed. -iron low so will do 1 dose of IV Fe  Chronic combined systolic and diastolic CHF: EF 45 to [id number] on echo done 06/20/2019.  No signs of volume overload at this time. -resume lasix PO in the AM after 2 doses of IV  Hypertension: Continue home metoprolol  GERD: Continue PPI  obesity Body mass index is 33.57 kg/m.  Left toe pain due to gout flare -Uric acid elevated -recently treated for gout with prednisone taper; did not tolerate colchicine due to diarrhea but will attempt to use colchicine again as well as steroids -X-ray does not show fracture -once recovered, will need preventative medication  Hypokalemia -repleted  Family Communication/Anticipated D/C date and plan/Code Status   DVT prophylaxis: Lovenox ordered. Code Status: Full Code.  Disposition Plan: Status is: Inpatient  Remains inpatient appropriate because:IV treatments appropriate due  to intensity of illness or inability to take PO and Inpatient level of care appropriate due to severity of illness   Dispo: The patient is from: Home              Anticipated d/c is to: Home              Anticipated d/c date is: in AM 5/26              Patient currently is not medically stable to d/c.        Medical Consultants:    None.   Subjective:   Became SOB and symptomatic after  getting upset after she said the nurse was not as friendly as she would like  Objective:    Vitals:   09/19/19 2157 09/20/19 0433 09/20/19 0921 09/20/19 0922  BP: (!) 155/71 (!) 142/61 (!) 162/66   Pulse: 75 70 84   Resp: 14 12 18    Temp: 98.5 F (36.9 C) 98 F (36.7 C)    TempSrc: Oral Oral    SpO2: 96% 94% 97% 97%  Weight: 84.6 kg     Height:        Intake/Output Summary (Last 24 hours) at 09/20/2019 1012 Last data filed at 09/20/2019 0601 Gross per 24 hour  Intake 900 ml  Output 225 ml  Net 675 ml   Filed Weights   09/15/19 2224 09/17/19 2058 09/19/19 2157  Weight: 81.8 kg 81.8 kg 84.6 kg    Exam:   General: Appearance:    Obese female who appears anxious     Lungs:     Clear to auscultation bilaterally, respirations unlabored but shallow  Heart:    Normal heart rate. Normal rhythm. No murmurs, rubs, or gallops.   MS:   All extremities are intact. Swelling in left LE improved as well as pain  Neurologic:   Anxious appearing     Data Reviewed:   I have personally reviewed following labs and imaging studies:  Labs: Labs show the following:   Basic Metabolic Panel: Recent Labs  Lab 09/15/19 1140 09/15/19 1140 09/16/19 0820 09/16/19 0820 09/17/19 0455 09/17/19 0455 09/18/19 0508 09/20/19 0456  NA 134*  --  134*  --  136  --  137 143  K 3.8   < > 3.4*   < > 4.0   < > 3.7 3.7  CL 95*  --  99  --  102  --  104 104  CO2 27  --  25  --  25  --  27 28  GLUCOSE 92  --  166*  --  95  --  112* 87  BUN 49*  --  45*  --  39*  --  35* 34*  CREATININE 2.39*  --  1.62*  --  1.31*  --  1.18* 1.07*  CALCIUM 9.4  --  8.8*  --  8.9  --  9.1 9.1   < > = values in this interval not displayed.   GFR Estimated Creatinine Clearance: 43.5 mL/min (A) (by C-G formula based on SCr of 1.07 mg/dL (H)). Liver Function Tests: Recent Labs  Lab 09/15/19 1528  AST 28  ALT 21  ALKPHOS 79  BILITOT 1.4*  PROT 6.5  ALBUMIN 2.9*   No results for input(s): LIPASE, AMYLASE in  the last 168 hours. No results for input(s): AMMONIA in the last 168 hours. Coagulation profile No results for input(s): INR, PROTIME in the last 168 hours.  CBC:  Recent Labs  Lab 09/15/19 1140 09/17/19 0455 09/18/19 0508 09/20/19 0456  WBC 8.8 6.2 7.3 6.8  HGB 10.7* 9.0* 9.4* 8.9*  HCT 33.5* 28.1* 29.0* 26.9*  MCV 87.9 88.4 87.9 87.6  PLT 161 159 178 226   Cardiac Enzymes: No results for input(s): CKTOTAL, CKMB, CKMBINDEX, TROPONINI in the last 168 hours. BNP (last 3 results) No results for input(s): PROBNP in the last 8760 hours. CBG: No results for input(s): GLUCAP in the last 168 hours. D-Dimer: No results for input(s): DDIMER in the last 72 hours. Hgb A1c: No results for input(s): HGBA1C in the last 72 hours. Lipid Profile: No results for input(s): CHOL, HDL, LDLCALC, TRIG, CHOLHDL, LDLDIRECT in the last 72 hours. Thyroid function studies: No results for input(s): TSH, T4TOTAL, T3FREE, THYROIDAB in the last 72 hours.  Invalid input(s): FREET3 Anemia work up: No results for input(s): VITAMINB12, FOLATE, FERRITIN, TIBC, IRON, RETICCTPCT in the last 72 hours. Sepsis Labs: Recent Labs  Lab 09/15/19 1140 09/17/19 0455 09/18/19 0508 09/20/19 0456  WBC 8.8 6.2 7.3 6.8    Microbiology Recent Results (from the past 240 hour(s))  Urine culture     Status: Abnormal   Collection Time: 09/15/19  3:18 PM   Specimen: Urine, Random  Result Value Ref Range Status   Specimen Description URINE, RANDOM  Final   Special Requests NONE  Final   Culture (A)  Final    >=100,000 COLONIES/mL GROUP B STREP(S.AGALACTIAE)ISOLATED TESTING AGAINST S. AGALACTIAE NOT ROUTINELY PERFORMED DUE TO PREDICTABILITY OF AMP/PEN/VAN SUSCEPTIBILITY. Performed at [facility], [place] [address] [place], [place] [address]    Report Status 09/16/2019 FINAL  Final  SARS Coronavirus 2 by RT PCR (hospital order, performed in [facility] hospital lab) Nasopharyngeal Nasopharyngeal Swab     Status:  None   Collection Time: 09/15/19  3:38 PM   Specimen: Nasopharyngeal Swab  Result Value Ref Range Status   SARS Coronavirus 2 NEGATIVE NEGATIVE Final    Comment: (NOTE) SARS-CoV-2 target nucleic acids are NOT DETECTED. The SARS-CoV-2 RNA is generally detectable in upper and lower respiratory specimens during the acute phase of infection. The lowest concentration of SARS-CoV-2 viral copies this assay can detect is 250 copies / mL. A negative result does not preclude SARS-CoV-2 infection and should not be used as the sole basis for treatment or other patient management decisions.  A negative result may occur with improper specimen collection / handling, submission of specimen other than nasopharyngeal swab, presence of viral mutation(s) within the areas targeted by this assay, and inadequate number of viral copies (<250 copies / mL). A negative result must be combined with clinical observations, patient history, and epidemiological information. Fact Sheet for Patients:   StrictlyIdeas.no Fact Sheet for Healthcare Providers: BankingDealers.co.za This test is not yet approved or cleared  by the [place] FDA and has been authorized for detection and/or diagnosis of SARS-CoV-2 by FDA under an Emergency Use Authorization (EUA).  This EUA will remain in effect (meaning this test can be used) for the duration of the COVID-19 declaration under Section 564(b)(1) of the Act, 21 U.S.C. section 360bbb-3(b)(1), unless the authorization is terminated or revoked sooner. Performed at [facility], [place] [address], [place], [place] [address]     Procedures and diagnostic studies:  DG CHEST PORT 1 VIEW  Result Date: 09/19/2019 CLINICAL DATA:  Shortness of breath EXAM: PORTABLE CHEST 1 VIEW COMPARISON:  Sep 15, 2019 chest radiograph and chest CT July 09, 2019 FINDINGS:  There is no edema or airspace opacity. Heart is upper normal in size with  pulmonary vascularity normal. There is an apparent hiatal hernia. There is aortic atherosclerosis. There is degenerative change in each shoulder. IMPRESSION: No edema or airspace opacity. Heart upper normal in size. Hiatal hernia evident. Aortic Atherosclerosis (ICD10-I70.0). Electronically Signed   By: Lowella Grip III M.D.   On: 09/19/2019 09:38    Medications:   . aspirin EC  162 mg Oral Daily  . calcium carbonate  1 tablet Oral Q breakfast  . cholecalciferol  1,000 Units Oral BID  . citalopram  20 mg Oral QHS  . colchicine  0.6 mg Oral Daily  . cycloSPORINE  1 drop Both Eyes BID  . diclofenac Sodium  2 g Topical QID  . enoxaparin (LOVENOX) injection  40 mg Subcutaneous Q24H  . furosemide  40 mg Intravenous Once  . metoprolol succinate  25 mg Oral Daily  . multivitamin with minerals  1 tablet Oral Daily  . omega-3 acid ethyl esters  1 g Oral Daily  . pantoprazole  40 mg Oral Daily  . polycarbophil  625 mg Oral BID  . pramipexole  0.5 mg Oral QHS  . predniSONE  30 mg Oral Q breakfast  . temazepam  15 mg Oral QHS   Continuous Infusions: . cefTRIAXone (ROCEPHIN)  IV Stopped (09/19/19 0943)     LOS: 4 days   Geradine Girt  [facility]   How to contact the [facility] Attending or Consulting provider [place] or covering provider during after hours [place], for this patient?  1. Check the care team in [facility] [employer] [facility] and look for a) attending/consulting [facility] provider listed and b) the [facility] team listed 2. Log into [email or web address] and use [facility]'s universal password to access. If you do not have the password, please contact the hospital operator. 3. Locate the [facility] provider you are looking for under [facility] and page to a number that you can be directly reached. 4. If you still have difficulty reaching the provider, please page the [facility] (Director on Call) for the Hospitalists listed on amion for assistance.  09/20/2019, 10:12 AM

## 2019-09-20 NOTE — Plan of Care (Signed)
  Problem: Activity: Goal: Activity intolerance will improve Outcome: Progressing   Problem: Coping: Goal: Level of anxiety will decrease Outcome: Progressing

## 2019-09-21 DIAGNOSIS — I5043 Acute on chronic combined systolic (congestive) and diastolic (congestive) heart failure: Secondary | ICD-10-CM

## 2019-09-21 DIAGNOSIS — I159 Secondary hypertension, unspecified: Secondary | ICD-10-CM

## 2019-09-21 DIAGNOSIS — R509 Fever, unspecified: Secondary | ICD-10-CM

## 2019-09-21 MED ORDER — CEFDINIR 300 MG PO CAPS
300.0000 mg | ORAL_CAPSULE | Freq: Two times a day (BID) | ORAL | 0 refills | Status: DC
Start: 2019-09-21 — End: 2019-11-15

## 2019-09-21 NOTE — Progress Notes (Signed)
[name] to be discharged Home  per MD order. Discussed prescriptions and follow up appointments with the patient. Prescriptions given to patient; medication list explained in detail. Patient verbalized understanding.  Skin clean, dry and intact without evidence of skin break down, no evidence of skin tears noted. IV catheter discontinued intact. Site without signs and symptoms of complications. Dressing and pressure applied. Pt denies pain at the site currently. No complaints noted.  Patient free of lines, drains, and wounds.   An After Visit Summary (AVS) was printed and given to the patient. Patient escorted via wheelchair, and discharged home via private auto.  Shela Commons, RN

## 2019-09-21 NOTE — Care Management (Signed)
Spoke with patient at bedside. She declines OP PT at this time. She states that she is familiar with it, she has had it before. She understands that if she changes her mind, her PCP can easily make a referral for her.

## 2019-09-21 NOTE — Discharge Summary (Signed)
Physician Discharge Summary  LAURALYNN RAUL [id number] DOB: [birth year] DOA: 09/15/2019  PCP: Lorretta Harp, MD  Admit date: 09/15/2019 Discharge date: 09/21/2019  Admitted From: home Disposition:  home  Recommendations for Outpatient Follow-up:  1. Follow up with PCP in 1-2 weeks  Home Health: none Equipment/Devices: none  Discharge Condition: stable CODE STATUS: Full code Diet recommendation: regular  HPI: Per admitting MD, [name] is a 79 y.o. female with medical history significant of mild CAD, chronic combined systolic and diastolic CHF (EF 45 to [id number] on echo done 06/20/2019), CKD stage III, GERD, hypertension, history of PE and DVT currently not on anticoagulation, prothrombin gene mutation, stroke, RLS, iron deficiency anemia, hemochromatosis presenting with a chief complaint of right flank pain. Patient reports 54-month history of right flank pain.  States she was seen in the hospital previously and told she did not have kidney stones based on her CT scan.  For the past 1 week the pain has been worse and she is taking her home Percocet on a scheduled basis.  Also reports temperature of 100.2 to 100.4 F for the past 2 days.  States sometimes she drinks up to 3 L of fluid and still makes no urine.  Denies any back pain or injuries to her back/flank region.  States her flank pain is sharp in character and 10 out of 10 in intensity.  She does not experience pain at rest but whenever she gets up in the bed or twists the pain starts again.  Also reports having diarrhea for the first 2 weeks of this month after her physician prescribed her a high-dose medicine for a gout flare.  Since she stopped taking the medicine, the diarrhea has resolved.  Denies any nausea, vomiting, or abdominal pain.  She has been vaccinated for Covid.  Denies cough or shortness of breath.  Hospital Course / Discharge diagnoses: AKI on CKD stage III-Likely prerenal secondary to dehydration in setting of  recent diarrhea and home diuretic plus ARB use. She was briefly on IVF with improvement in her renal function. She did have an episode of dyspnea thought to be due to IVF and they were discontinued and has received Lasix, with improvement in her respiratory status and currently returned to baseline Severe anxiety with memory impairment-- ? Early dementia -very anxious which is driving her SOB and other issues Dyspnea -Patient able to carry on a very verbose conversation without appearing winded, possibly related to anxiety, x ray free of pulm edema/infiltrate Group B strep UTI / pyelonephritis - with flank pain, now resolved after antibiotics. Tolerated Ceftriaxone well, will transition to Cefdinir Normocytic anemia with h/o hemochromatosis -outpatient follow up  Chronic combined systolic and diastolic CHF: EF 45 to [id number] on echo done 06/20/2019. No signs of volume overload at this time. Hypertension: Continue home metoprolol GERD: Continue PPI Obesity Body mass index is 33.57 kg/m. Left toe pain due to gout flare -Uric acid elevated, responded to prednisone, resolved. Hypokalemia -repleted  Discharge Instructions   Allergies as of 09/21/2019      Reactions   Morphine And Related Other (See Comments)   LARGER DOSES OF MORPHINE CAUSES BODY TWITCHING   Penicillins Anaphylaxis   Childhood reaction Has patient had a PCN reaction causing immediate rash, facial/tongue/throat swelling, SOB or lightheadedness with hypotension: Yes Has patient had a PCN reaction causing severe rash involving mucus membranes or skin necrosis: Unknown Has patient had a PCN reaction that required hospitalization: No Has patient had a PCN  reaction occurring within the last 10 years: no (5 or 80 yrs old) If all of the above answers are "NO", then may proceed with Cephalosporin use.   Sulfa Antibiotics Other (See Comments)   Unknown childhood reaction      Medication List    TAKE these medications   ARTIFICIAL TEARS  OP Place 1 drop into both eyes 3 (three) times daily as needed (dry eyes).   aspirin EC 81 MG tablet Take 162 mg by mouth daily.   atorvastatin 40 MG tablet Commonly known as: LIPITOR Take 1 tablet (40 mg total) by mouth daily at 6 PM.   Biotin 5000 MCG Tabs Take 5,000 mcg by mouth every morning.   Calcium 600 600 MG Tabs tablet Generic drug: calcium carbonate Take 600 mg by mouth daily with breakfast.   cefdinir 300 MG capsule Commonly known as: OMNICEF Take 1 capsule (300 mg total) by mouth 2 (two) times daily.   cholecalciferol 25 MCG (1000 UNIT) tablet Commonly known as: VITAMIN D3 Take 1,000 Units by mouth 2 (two) times daily.   citalopram 20 MG tablet Commonly known as: CELEXA Take 20 mg by mouth at bedtime.   colchicine 0.6 MG tablet Take 1 tablet (0.6 mg total) by mouth daily. Gout flare up. What changed:   when to take this  reasons to take this  additional instructions   cycloSPORINE 0.05 % ophthalmic emulsion Commonly known as: RESTASIS Place 1 drop into both eyes 2 (two) times daily.   diclofenac Sodium 1 % Gel Commonly known as: Voltaren Apply 4 g topically 4 (four) times daily.   FiberCon 625 MG tablet Generic drug: polycarbophil Take 625 mg by mouth in the morning and at bedtime.   Fish Oil 1200 MG Caps Take 1,200 mg by mouth daily.   furosemide 80 MG tablet Commonly known as: Lasix Take 1 tablet (80 mg total) by mouth daily.   losartan 50 MG tablet Commonly known as: COZAAR Take 1 tablet (50 mg total) by mouth daily.   metoprolol succinate 25 MG 24 hr tablet Commonly known as: TOPROL-XL Take 1 tablet (25 mg total) by mouth daily.   multivitamin with minerals Tabs tablet Take 1 tablet by mouth daily. Centrum Silver   omeprazole 20 MG capsule Commonly known as: PRILOSEC Take 20 mg by mouth daily.   oxyCODONE-acetaminophen 5-325 MG tablet Commonly known as: PERCOCET/ROXICET Take 1 tablet by mouth every 6 (six) hours as needed  (pain).   pramipexole 0.5 MG tablet Commonly known as: MIRAPEX Take 0.5 mg by mouth at bedtime.   temazepam 15 MG capsule Commonly known as: RESTORIL Take 1 capsule (15 mg total) by mouth at bedtime.   Turmeric Curcumin 500 MG Caps Take 500 mg by mouth at bedtime.   VITAMIN E PO Take 1 capsule by mouth at bedtime.       Consultations:  None   Procedures/Studies:  DG Chest 2 View  Result Date: 09/15/2019 CLINICAL DATA:  Flank pain, fever EXAM: CHEST - 2 VIEW COMPARISON:  08/10/2019 FINDINGS: The heart size and mediastinal contours are stable. Atherosclerotic calcification of the aortic knob. No focal airspace consolidation, pleural effusion, or pneumothorax. Advanced degenerative changes of the right shoulder. Partially visualized lumbar spondylosis. IMPRESSION: No active cardiopulmonary disease. Electronically Signed   By: Davina Poke D.O.   On: 09/15/2019 16:16   NM Pulmonary Perfusion  Result Date: 09/15/2019 CLINICAL DATA:  flank pain, hx/o PE EXAM: NUCLEAR MEDICINE PERFUSION LUNG SCAN TECHNIQUE: Perfusion images were obtained  in multiple projections after intravenous injection of radiopharmaceutical. Ventilation scans intentionally deferred if perfusion scan and chest x-ray adequate for interpretation during COVID 19 epidemic. RADIOPHARMACEUTICALS:  1.65 mCi Tc-92m MAA IV COMPARISON:  Chest radiograph earlier this day. Chest CTA 07/09/2019 FINDINGS: Homogeneous distribution of radiotracer throughout the lungs. There are no wedge-shaped or peripheral perfusion abnormalities to suggest pulmonary embolus. Cardiomegaly is seen on chest radiograph. IMPRESSION: Low probability for pulmonary embolus. Electronically Signed   By: Keith Rake M.D.   On: 09/15/2019 18:03   US RENAL  Result Date: 09/16/2019 CLINICAL DATA:  Initial evaluation for acute flank pain. EXAM: RENAL / URINARY TRACT ULTRASOUND COMPLETE COMPARISON:  Prior CT from 08/10/2019. FINDINGS: Right Kidney: Renal  measurements: 8.5 x 3.5 x 4.3 cm = volume: 65.6 mL. Echogenicity within normal limits. Focal cortical thinning/scarring noted at the upper pole. No nephrolithiasis or hydronephrosis. No focal renal mass. Left Kidney: Renal measurements: 8.1 x 4.6 x 4.3 cm = volume: 82.3 mL. Echogenicity within normal limits. No nephrolithiasis or hydronephrosis. No focal renal mass. Bladder: Appears normal for degree of bladder distention. Bilateral jets are visualized. Other: None. IMPRESSION: 1. No hydronephrosis or other acute abnormality. No sonographic evidence for nephrolithiasis. 2. Focal cortical thinning/scarring at the upper pole of the right kidney. Electronically Signed   By: Jeannine Boga M.D.   On: 09/16/2019 00:56   DG CHEST PORT 1 VIEW  Result Date: 09/19/2019 CLINICAL DATA:  Shortness of breath EXAM: PORTABLE CHEST 1 VIEW COMPARISON:  Sep 15, 2019 chest radiograph and chest CT July 09, 2019 FINDINGS: There is no edema or airspace opacity. Heart is upper normal in size with pulmonary vascularity normal. There is an apparent hiatal hernia. There is aortic atherosclerosis. There is degenerative change in each shoulder. IMPRESSION: No edema or airspace opacity. Heart upper normal in size. Hiatal hernia evident. Aortic Atherosclerosis (ICD10-I70.0). Electronically Signed   By: Lowella Grip III M.D.   On: 09/19/2019 09:38   DG Toe Great Left  Result Date: 09/16/2019 CLINICAL DATA:  Left great pain EXAM: LEFT GREAT TOE COMPARISON:  None. FINDINGS: No acute osseous evaluate seen. Specifically, no marginal erosions in this patient with history of gout. No fracture is seen. Mild degenerative changes of the 1st MTP joint. Visualized soft tissues are within normal limits. IMPRESSION: No acute osseous abnormality is seen. Electronically Signed   By: Julian Hy M.D.   On: 09/16/2019 11:35      Subjective: - no chest pain, shortness of breath, no abdominal pain, nausea or vomiting.   Discharge  Exam: BP (!) 145/72 (BP Location: Right Arm)   Pulse (!) 57   Temp 98.3 F (36.8 C) (Oral)   Resp 16   Ht 5' 2.5" (1.588 m)   Wt 84.2 kg   SpO2 96%   BMI 33.41 kg/m   General: Pt is alert, awake, not in acute distress Cardiovascular: RRR, S1/S2 +, no rubs, no gallops Respiratory: CTA bilaterally, no wheezing, no rhonchi Abdominal: Soft, NT, ND, bowel sounds + Extremities: no edema, no cyanosis    The results of significant diagnostics from this hospitalization (including imaging, microbiology, ancillary and laboratory) are listed below for reference.     Microbiology: Recent Results (from the past 240 hour(s))  Urine culture     Status: Abnormal   Collection Time: 09/15/19  3:18 PM   Specimen: Urine, Random  Result Value Ref Range Status   Specimen Description URINE, RANDOM  Final   Special Requests NONE  Final  Culture (A)  Final    >=100,000 COLONIES/mL GROUP B STREP(S.AGALACTIAE)ISOLATED TESTING AGAINST S. AGALACTIAE NOT ROUTINELY PERFORMED DUE TO PREDICTABILITY OF AMP/PEN/VAN SUSCEPTIBILITY. Performed at [facility], [place] [address], [place], [place] [address]    Report Status 09/16/2019 FINAL  Final  SARS Coronavirus 2 by RT PCR (hospital order, performed in [facility] hospital lab) Nasopharyngeal Nasopharyngeal Swab     Status: None   Collection Time: 09/15/19  3:38 PM   Specimen: Nasopharyngeal Swab  Result Value Ref Range Status   SARS Coronavirus 2 NEGATIVE NEGATIVE Final    Comment: (NOTE) SARS-CoV-2 target nucleic acids are NOT DETECTED. The SARS-CoV-2 RNA is generally detectable in upper and lower respiratory specimens during the acute phase of infection. The lowest concentration of SARS-CoV-2 viral copies this assay can detect is 250 copies / mL. A negative result does not preclude SARS-CoV-2 infection and should not be used as the sole basis for treatment or other patient management decisions.  A negative result may occur with improper specimen  collection / handling, submission of specimen other than nasopharyngeal swab, presence of viral mutation(s) within the areas targeted by this assay, and inadequate number of viral copies (<250 copies / mL). A negative result must be combined with clinical observations, patient history, and epidemiological information. Fact Sheet for Patients:   StrictlyIdeas.no Fact Sheet for Healthcare Providers: BankingDealers.co.za This test is not yet approved or cleared  by the [place] FDA and has been authorized for detection and/or diagnosis of SARS-CoV-2 by FDA under an Emergency Use Authorization (EUA).  This EUA will remain in effect (meaning this test can be used) for the duration of the COVID-19 declaration under Section 564(b)(1) of the Act, 21 U.S.C. section 360bbb-3(b)(1), unless the authorization is terminated or revoked sooner. Performed at [facility], [place] [address], [place], [place] [address]      Labs: Basic Metabolic Panel: Recent Labs  Lab 09/15/19 1140 09/16/19 0820 09/17/19 0455 09/18/19 0508 09/20/19 0456  NA 134* 134* 136 137 143  K 3.8 3.4* 4.0 3.7 3.7  CL 95* 99 102 104 104  CO2 27 25 25 27 28   GLUCOSE 92 166* 95 112* 87  BUN 49* 45* 39* 35* 34*  CREATININE 2.39* 1.62* 1.31* 1.18* 1.07*  CALCIUM 9.4 8.8* 8.9 9.1 9.1   Liver Function Tests: Recent Labs  Lab 09/15/19 1528  AST 28  ALT 21  ALKPHOS 79  BILITOT 1.4*  PROT 6.5  ALBUMIN 2.9*   CBC: Recent Labs  Lab 09/15/19 1140 09/17/19 0455 09/18/19 0508 09/20/19 0456  WBC 8.8 6.2 7.3 6.8  HGB 10.7* 9.0* 9.4* 8.9*  HCT 33.5* 28.1* 29.0* 26.9*  MCV 87.9 88.4 87.9 87.6  PLT 161 159 178 226   CBG: No results for input(s): GLUCAP in the last 168 hours. Hgb A1c No results for input(s): HGBA1C in the last 72 hours. Lipid Profile No results for input(s): CHOL, HDL, LDLCALC, TRIG, CHOLHDL, LDLDIRECT in the last 72 hours. Thyroid function  studies No results for input(s): TSH, T4TOTAL, T3FREE, THYROIDAB in the last 72 hours.  Invalid input(s): FREET3 Urinalysis    Component Value Date/Time   COLORURINE YELLOW 09/15/2019 [place] 09/15/2019 1552   LABSPEC 1.014 09/15/2019 1552   PHURINE 5.0 09/15/2019 1552   GLUCOSEU NEGATIVE 09/15/2019 1552   HGBUR NEGATIVE 09/15/2019 1552   BILIRUBINUR NEGATIVE 09/15/2019 [place] 09/15/2019 1552   PROTEINUR NEGATIVE 09/15/2019 1552   UROBILINOGEN 0.2  11/24/2010 1201   NITRITE NEGATIVE 09/15/2019 1552   LEUKOCYTESUR NEGATIVE 09/15/2019 1552    FURTHER DISCHARGE INSTRUCTIONS:   Get Medicines reviewed and adjusted: Please take all your medications with you for your next visit with your Primary MD   Laboratory/radiological data: Please request your Primary MD to go over all hospital tests and procedure/radiological results at the follow up, please ask your Primary MD to get all Hospital records sent to his/her office.   In some cases, they will be blood work, cultures and biopsy results pending at the time of your discharge. Please request that your primary care M.D. goes through all the records of your hospital data and follows up on these results.   Also Note the following: If you experience worsening of your admission symptoms, develop shortness of breath, life threatening emergency, suicidal or homicidal thoughts you must seek medical attention immediately by calling 911 or calling your MD immediately  if symptoms less severe.   You must read complete instructions/literature along with all the possible adverse reactions/side effects for all the Medicines you take and that have been prescribed to you. Take any new Medicines after you have completely understood and accpet all the possible adverse reactions/side effects.    Do not drive when taking Pain medications or sleeping medications (Benzodaizepines)   Do not take more than prescribed Pain,  Sleep and Anxiety Medications. It is not advisable to combine anxiety,sleep and pain medications without talking with your primary care practitioner   Special Instructions: If you have smoked or chewed Tobacco  in the last 2 yrs please stop smoking, stop any regular Alcohol  and or any Recreational drug use.   Wear Seat belts while driving.   Please note: You were cared for by a hospitalist during your hospital stay. Once you are discharged, your primary care physician will handle any further medical issues. Please note that NO REFILLS for any discharge medications will be authorized once you are discharged, as it is imperative that you return to your primary care physician (or establish a relationship with a primary care physician if you do not have one) for your post hospital discharge needs so that they can reassess your need for medications and monitor your lab values.  Time coordinating discharge: 40 minutes  SIGNED:  Marzetta Board, MD, PhD 09/21/2019, 9:22 AM

## 2019-09-21 NOTE — Discharge Instructions (Signed)
Follow with Lorretta Harp, MD in 1-2 weeks  Please check daily weights because you are on Lasix, if you gain 2 to 3 pounds over couple of days or noticed lower extremity swelling please limit your water intake to 1200 cc/day for a few days while your weight returns to normal.   Please get a complete blood count and chemistry panel checked by your Primary MD at your next visit, and again as instructed by your Primary MD. Please get your medications reviewed and adjusted by your Primary MD.  Please request your Primary MD to go over all Hospital Tests and Procedure/Radiological results at the follow up, please get all Hospital records sent to your Prim MD by signing hospital release before you go home.  In some cases, there will be blood work, cultures and biopsy results pending at the time of your discharge. Please request that your primary care M.D. goes through all the records of your hospital data and follows up on these results.  If you had Pneumonia of Lung problems at the Hospital: Please get a 2 view Chest X ray done in 6-8 weeks after hospital discharge or sooner if instructed by your Primary MD.  If you have Congestive Heart Failure: Please call your Cardiologist or Primary MD anytime you have any of the following symptoms:  1) 3 pound weight gain in 24 hours or 5 pounds in 1 week  2) shortness of breath, with or without a dry hacking cough  3) swelling in the hands, feet or stomach  4) if you have to sleep on extra pillows at night in order to breathe  Follow cardiac low salt diet and 1.5 lit/day fluid restriction.  If you have diabetes Accuchecks 4 times/day, Once in AM empty stomach and then before each meal. Log in all results and show them to your primary doctor at your next visit. If any glucose reading is under 80 or above 300 call your primary MD immediately.  If you have Seizure/Convulsions/Epilepsy: Please do not drive, operate heavy machinery, participate in  activities at heights or participate in high speed sports until you have seen by Primary MD or a Neurologist and advised to do so again. Per [facility] statutes, patients with seizures are not allowed to drive until they have been seizure-free for six months.  Use caution when using heavy equipment or power tools. Avoid working on ladders or at heights. Take showers instead of baths. Ensure the water temperature is not too high on the home water heater. Do not go swimming alone. Do not lock yourself in a room alone (i.e. bathroom). When caring for infants or small children, sit down when holding, feeding, or changing them to minimize risk of injury to the child in the event you have a seizure. Maintain good sleep hygiene. Avoid alcohol.   If you had Gastrointestinal Bleeding: Please ask your Primary MD to check a complete blood count within one week of discharge or at your next visit. Your endoscopic/colonoscopic biopsies that are pending at the time of discharge, will also need to followed by your Primary MD.  Get Medicines reviewed and adjusted. Please take all your medications with you for your next visit with your Primary MD  Please request your Primary MD to go over all hospital tests and procedure/radiological results at the follow up, please ask your Primary MD to get all Hospital records sent to his/her office.  If you experience worsening of your admission symptoms, develop shortness of breath,  life threatening emergency, suicidal or homicidal thoughts you must seek medical attention immediately by calling 911 or calling your MD immediately  if symptoms less severe.  You must read complete instructions/literature along with all the possible adverse reactions/side effects for all the Medicines you take and that have been prescribed to you. Take any new Medicines after you have completely understood and accpet all the possible adverse reactions/side effects.   Do not drive or operate  heavy machinery when taking Pain medications.   Do not take more than prescribed Pain, Sleep and Anxiety Medications  Special Instructions: If you have smoked or chewed Tobacco  in the last 2 yrs please stop smoking, stop any regular Alcohol  and or any Recreational drug use.  Wear Seat belts while driving.  Please note You were cared for by a hospitalist during your hospital stay. If you have any questions about your discharge medications or the care you received while you were in the hospital after you are discharged, you can call the unit and asked to speak with the hospitalist on call if the hospitalist that took care of you is not available. Once you are discharged, your primary care physician will handle any further medical issues. Please note that NO REFILLS for any discharge medications will be authorized once you are discharged, as it is imperative that you return to your primary care physician (or establish a relationship with a primary care physician if you do not have one) for your aftercare needs so that they can reassess your need for medications and monitor your lab values.  You can reach the hospitalist office at phone [phone] or fax [phone]   If you do not have a primary care physician, you can call [phone] for a physician referral.  Activity: As tolerated with Full fall precautions use walker/cane & assistance as needed    Diet: regular  Disposition Home

## 2019-09-21 NOTE — Progress Notes (Signed)
AVS instructions discussed with patient.  All questions discussed and answered.  PIV removed.  Personal belongings sent with patient.  Patient discharged into husband's care.  Earleen Reaper RN

## 2019-09-22 DIAGNOSIS — M19041 Primary osteoarthritis, right hand: Secondary | ICD-10-CM | POA: Diagnosis not present

## 2019-09-22 DIAGNOSIS — F329 Major depressive disorder, single episode, unspecified: Secondary | ICD-10-CM | POA: Diagnosis not present

## 2019-09-22 DIAGNOSIS — I10 Essential (primary) hypertension: Secondary | ICD-10-CM | POA: Diagnosis not present

## 2019-09-22 DIAGNOSIS — M1990 Unspecified osteoarthritis, unspecified site: Secondary | ICD-10-CM | POA: Diagnosis not present

## 2019-09-22 DIAGNOSIS — M8580 Other specified disorders of bone density and structure, unspecified site: Secondary | ICD-10-CM | POA: Diagnosis not present

## 2019-09-22 DIAGNOSIS — D508 Other iron deficiency anemias: Secondary | ICD-10-CM | POA: Diagnosis not present

## 2019-09-22 DIAGNOSIS — N159 Renal tubulo-interstitial disease, unspecified: Secondary | ICD-10-CM | POA: Diagnosis not present

## 2019-09-22 DIAGNOSIS — E7800 Pure hypercholesterolemia, unspecified: Secondary | ICD-10-CM | POA: Diagnosis not present

## 2019-09-22 DIAGNOSIS — N183 Chronic kidney disease, stage 3 unspecified: Secondary | ICD-10-CM | POA: Diagnosis not present

## 2019-10-04 DIAGNOSIS — M10042 Idiopathic gout, left hand: Secondary | ICD-10-CM | POA: Diagnosis not present

## 2019-10-04 DIAGNOSIS — N12 Tubulo-interstitial nephritis, not specified as acute or chronic: Secondary | ICD-10-CM | POA: Diagnosis not present

## 2019-10-04 DIAGNOSIS — N183 Chronic kidney disease, stage 3 unspecified: Secondary | ICD-10-CM | POA: Diagnosis not present

## 2019-10-04 DIAGNOSIS — R251 Tremor, unspecified: Secondary | ICD-10-CM | POA: Diagnosis not present

## 2019-10-04 DIAGNOSIS — D508 Other iron deficiency anemias: Secondary | ICD-10-CM | POA: Diagnosis not present

## 2019-10-10 ENCOUNTER — Other Ambulatory Visit: Payer: Self-pay | Admitting: *Deleted

## 2019-10-10 ENCOUNTER — Telehealth: Payer: Self-pay | Admitting: *Deleted

## 2019-10-10 DIAGNOSIS — D500 Iron deficiency anemia secondary to blood loss (chronic): Secondary | ICD-10-CM

## 2019-10-10 NOTE — Telephone Encounter (Signed)
Patient called stating that she was told in the hospital that she needs to start getting IV iron infusions.  Patient going on vacation in 2 days.  Has appt with Dr Marin Olp in July.  Patient ok to wait till July to check labwork for low iron .

## 2019-10-12 DIAGNOSIS — N159 Renal tubulo-interstitial disease, unspecified: Secondary | ICD-10-CM | POA: Diagnosis not present

## 2019-10-12 DIAGNOSIS — E7800 Pure hypercholesterolemia, unspecified: Secondary | ICD-10-CM | POA: Diagnosis not present

## 2019-10-12 DIAGNOSIS — N183 Chronic kidney disease, stage 3 unspecified: Secondary | ICD-10-CM | POA: Diagnosis not present

## 2019-10-12 DIAGNOSIS — I10 Essential (primary) hypertension: Secondary | ICD-10-CM | POA: Diagnosis not present

## 2019-10-12 DIAGNOSIS — M8580 Other specified disorders of bone density and structure, unspecified site: Secondary | ICD-10-CM | POA: Diagnosis not present

## 2019-10-12 DIAGNOSIS — M19041 Primary osteoarthritis, right hand: Secondary | ICD-10-CM | POA: Diagnosis not present

## 2019-10-12 DIAGNOSIS — D508 Other iron deficiency anemias: Secondary | ICD-10-CM | POA: Diagnosis not present

## 2019-10-12 DIAGNOSIS — M1990 Unspecified osteoarthritis, unspecified site: Secondary | ICD-10-CM | POA: Diagnosis not present

## 2019-10-12 DIAGNOSIS — F329 Major depressive disorder, single episode, unspecified: Secondary | ICD-10-CM | POA: Diagnosis not present

## 2019-11-01 DIAGNOSIS — M170 Bilateral primary osteoarthritis of knee: Secondary | ICD-10-CM | POA: Diagnosis not present

## 2019-11-01 DIAGNOSIS — M25561 Pain in right knee: Secondary | ICD-10-CM | POA: Diagnosis not present

## 2019-11-01 DIAGNOSIS — M1712 Unilateral primary osteoarthritis, left knee: Secondary | ICD-10-CM | POA: Diagnosis not present

## 2019-11-01 DIAGNOSIS — M1711 Unilateral primary osteoarthritis, right knee: Secondary | ICD-10-CM | POA: Diagnosis not present

## 2019-11-07 ENCOUNTER — Ambulatory Visit: Payer: PPO | Admitting: Hematology & Oncology

## 2019-11-07 ENCOUNTER — Other Ambulatory Visit: Payer: PPO

## 2019-11-08 DIAGNOSIS — M1711 Unilateral primary osteoarthritis, right knee: Secondary | ICD-10-CM | POA: Diagnosis not present

## 2019-11-09 DIAGNOSIS — F329 Major depressive disorder, single episode, unspecified: Secondary | ICD-10-CM | POA: Diagnosis not present

## 2019-11-09 DIAGNOSIS — I10 Essential (primary) hypertension: Secondary | ICD-10-CM | POA: Diagnosis not present

## 2019-11-09 DIAGNOSIS — M1990 Unspecified osteoarthritis, unspecified site: Secondary | ICD-10-CM | POA: Diagnosis not present

## 2019-11-09 DIAGNOSIS — E7800 Pure hypercholesterolemia, unspecified: Secondary | ICD-10-CM | POA: Diagnosis not present

## 2019-11-09 DIAGNOSIS — N183 Chronic kidney disease, stage 3 unspecified: Secondary | ICD-10-CM | POA: Diagnosis not present

## 2019-11-09 DIAGNOSIS — M19041 Primary osteoarthritis, right hand: Secondary | ICD-10-CM | POA: Diagnosis not present

## 2019-11-09 DIAGNOSIS — N159 Renal tubulo-interstitial disease, unspecified: Secondary | ICD-10-CM | POA: Diagnosis not present

## 2019-11-09 DIAGNOSIS — D508 Other iron deficiency anemias: Secondary | ICD-10-CM | POA: Diagnosis not present

## 2019-11-09 DIAGNOSIS — M8580 Other specified disorders of bone density and structure, unspecified site: Secondary | ICD-10-CM | POA: Diagnosis not present

## 2019-11-10 ENCOUNTER — Emergency Department (HOSPITAL_COMMUNITY)
Admission: EM | Admit: 2019-11-10 | Discharge: 2019-11-10 | Disposition: A | Discharge status: Home / Self Care | Payer: PPO | Attending: Emergency Medicine | Admitting: Emergency Medicine

## 2019-11-10 ENCOUNTER — Encounter (HOSPITAL_COMMUNITY): Payer: Self-pay | Admitting: Emergency Medicine

## 2019-11-10 ENCOUNTER — Other Ambulatory Visit: Payer: Self-pay

## 2019-11-10 ENCOUNTER — Emergency Department (HOSPITAL_COMMUNITY): Payer: PPO

## 2019-11-10 DIAGNOSIS — I509 Heart failure, unspecified: Secondary | ICD-10-CM | POA: Insufficient documentation

## 2019-11-10 DIAGNOSIS — Z880 Allergy status to penicillin: Secondary | ICD-10-CM | POA: Insufficient documentation

## 2019-11-10 DIAGNOSIS — M25511 Pain in right shoulder: Secondary | ICD-10-CM | POA: Diagnosis not present

## 2019-11-10 DIAGNOSIS — G8929 Other chronic pain: Secondary | ICD-10-CM

## 2019-11-10 DIAGNOSIS — Z7982 Long term (current) use of aspirin: Secondary | ICD-10-CM | POA: Insufficient documentation

## 2019-11-10 DIAGNOSIS — Z96611 Presence of right artificial shoulder joint: Secondary | ICD-10-CM | POA: Diagnosis not present

## 2019-11-10 DIAGNOSIS — Z79899 Other long term (current) drug therapy: Secondary | ICD-10-CM | POA: Diagnosis not present

## 2019-11-10 DIAGNOSIS — M109 Gout, unspecified: Secondary | ICD-10-CM | POA: Diagnosis not present

## 2019-11-10 DIAGNOSIS — I10 Essential (primary) hypertension: Secondary | ICD-10-CM | POA: Diagnosis not present

## 2019-11-10 DIAGNOSIS — I110 Hypertensive heart disease with heart failure: Secondary | ICD-10-CM | POA: Insufficient documentation

## 2019-11-10 DIAGNOSIS — Z9861 Coronary angioplasty status: Secondary | ICD-10-CM | POA: Insufficient documentation

## 2019-11-10 MED ORDER — OXYCODONE-ACETAMINOPHEN 5-325 MG PO TABS: 1.0000 | ORAL_TABLET | ORAL | 0 refills | Status: DC | PRN

## 2019-11-10 NOTE — ED Triage Notes (Signed)
Pt c/o R shoulder pain that radiates across back, neck, and to R elbow since Tuesday.  No recent injury.  Fell 1 month ago in bathtub.

## 2019-11-10 NOTE — ED Provider Notes (Signed)
[facility] EMERGENCY DEPARTMENT Provider Note   CSN: [id number] Arrival date & time: 11/10/19  6789     History Chief Complaint  Patient presents with  . Shoulder Pain    [name] is a 79 y.o. female.  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  Patient reports she has pain in her knees and Dr. Maxie Better is doing injections in her knees.  Patient states she saw Dr. Maxie Better last week and she thought he was going to give her something for pain but he did not.  Patient complains of having gout in her fingers.  She has been on 2 courses of prednisone recently.  Patient states she would like to be on a medication to prevent her flareups of gout.  Patient states she has not discussed this with her primary care physician.  Patient reports she thinks she needs to have right shoulder surgery patient wants a shoulder replacement.  Patient reports she came in here today with the hopes of getting shoulder surgery.  She has not discussed her shoulder pain with Dr. Maxie Better.  The history is provided by the patient. No language interpreter was used.  Shoulder Pain      Past Medical History:  Diagnosis Date  . Anemia   . Arthritis   . Bell's palsy   . CHF (congestive heart failure) ([place])   . GERD (gastroesophageal reflux disease)   . Hemochromatosis   . History of hiatal hernia   . Hypertension   . Iron deficiency anemia due to chronic blood loss 03/10/2017  . PE (pulmonary embolism)   . Pneumonia    "walking" pneumonia  . Prothrombin gene mutation ([place]) 05/30/2013  . Restless legs   . Right leg DVT ([place]) 05/30/2013  . Stroke [facility])    found on a MRI, she's not aware otherwise    Patient Active Problem List   Diagnosis Date Noted  . AKI (acute kidney injury) ([place]) 09/15/2019  . Right flank pain 09/15/2019  . Anemia 09/15/2019  . Abnormal nuclear cardiac imaging test   . CHF  (congestive heart failure) ([place]) 07/09/2019  . Dyspnea 07/09/2019  . Respiratory failure with hypoxia ([place]) 07/09/2019  . Shoulder pain 07/09/2019  . Dyspnea on exertion 06/03/2019  . Bilateral lower extremity edema 06/03/2019  . Left bundle branch block 06/03/2019  . Iron deficiency anemia due to chronic blood loss 03/10/2017  . Postoperative wound dehiscence 12/02/2016  . E. coli UTI   . Abnormal urinalysis   . Medication side effect   . Neuropathic pain   . History of DVT (deep vein thrombosis)   . HTN (hypertension)   . RLS (restless legs syndrome)   . Urinary retention   . Hypokalemia   . Hypoalbuminemia due to protein-calorie malnutrition ([place])   . Acute blood loss anemia   . Lumbar stenosis with neurogenic claudication 11/18/2016  . Spondylolisthesis of lumbar region 11/12/2016  . Hereditary hemochromatosis ([place]) 07/28/2016  . Prothrombin gene mutation ([place]) 05/30/2013  . Right leg DVT ([place]) 05/30/2013  . Hemochromatosis 07/29/2012    Past Surgical History:  Procedure Laterality Date  . BACK SURGERY    . COLONOSCOPY    . RIGHT/LEFT HEART CATH AND CORONARY ANGIOGRAPHY N/A 07/12/2019   Procedure: RIGHT/LEFT HEART CATH AND CORONARY ANGIOGRAPHY;  Surgeon: Troy Sine, MD;  Location: [facility] CV LAB;  Service: Cardiovascular;  Laterality:  N/A;  . SHOULDER SURGERY Bilateral      OB History   No obstetric history on file.     Family History  Problem Relation Age of Onset  . Congestive Heart Failure Mother   . Pancreatic cancer Father     Social History   Tobacco Use  . Smoking status: Never Smoker  . Smokeless tobacco: Never Used  . Tobacco comment: never used tobacco  Vaping Use  . Vaping Use: Never used  Substance Use Topics  . Alcohol use: No    Alcohol/week: 0.0 standard drinks  . Drug use: No    Home Medications Prior to Admission medications   Medication Sig Start Date End Date Taking? Authorizing Provider  aspirin EC 81 MG tablet Take 162  mg by mouth daily.    [provider]  atorvastatin (LIPITOR) 40 MG tablet Take 1 tablet (40 mg total) by mouth daily at 6 PM. Patient not taking: Reported on 09/15/2019 07/13/19   Oswald Hillock, MD  Biotin 5000 MCG TABS Take 5,000 mcg by mouth every morning.    [provider]  calcium carbonate (CALCIUM 600) 600 MG TABS tablet Take 600 mg by mouth daily with breakfast.    [provider]  cefdinir (OMNICEF) 300 MG capsule Take 1 capsule (300 mg total) by mouth 2 (two) times daily. 09/21/19   Caren Griffins, MD  cholecalciferol (VITAMIN D3) 25 MCG (1000 UNIT) tablet Take 1,000 Units by mouth 2 (two) times daily.     [provider]  citalopram (CELEXA) 20 MG tablet Take 20 mg by mouth at bedtime.  08/05/11   [provider]  colchicine 0.6 MG tablet Take 1 tablet (0.6 mg total) by mouth daily. Gout flare up. Patient taking differently: Take 0.6 mg by mouth daily as needed (gout flare).  12/02/16   Love, Ivan Anchors, PA-C  cycloSPORINE (RESTASIS) 0.05 % ophthalmic emulsion Place 1 drop into both eyes 2 (two) times daily.    [provider]  diclofenac Sodium (VOLTAREN) 1 % GEL Apply 4 g topically 4 (four) times daily. Patient not taking: Reported on 09/15/2019 08/10/19   Blanchie Dessert, MD  furosemide (LASIX) 80 MG tablet Take 1 tablet (80 mg total) by mouth daily. 07/13/19 07/12/20  Oswald Hillock, MD  Hypromellose (ARTIFICIAL TEARS OP) Place 1 drop into both eyes 3 (three) times daily as needed (dry eyes).    [provider]  losartan (COZAAR) 50 MG tablet Take 1 tablet (50 mg total) by mouth daily. 07/13/19   Oswald Hillock, MD  metoprolol succinate (TOPROL-XL) 25 MG 24 hr tablet Take 1 tablet (25 mg total) by mouth daily. 07/14/19   Oswald Hillock, MD  Multiple Vitamin (MULTIVITAMIN WITH MINERALS) TABS tablet Take 1 tablet by mouth daily. Centrum Silver    [provider]  Omega-3 Fatty Acids (FISH OIL) 1200 MG CAPS Take 1,200 mg by  mouth daily.    [provider]  omeprazole (PRILOSEC) 20 MG capsule Take 20 mg by mouth daily. 07/04/19   [provider]  oxyCODONE-acetaminophen (PERCOCET/ROXICET) 5-325 MG tablet Take 1 tablet by mouth every 6 (six) hours as needed (pain).  03/10/17   [provider]  polycarbophil (FIBERCON) 625 MG tablet Take 625 mg by mouth in the morning and at bedtime.    [provider]  pramipexole (MIRAPEX) 0.5 MG tablet Take 0.5 mg by mouth at bedtime.     [provider]  temazepam (RESTORIL) 15  MG capsule Take 1 capsule (15 mg total) by mouth at bedtime. 12/02/16   Love, Ivan Anchors, PA-C  Turmeric Curcumin 500 MG CAPS Take 500 mg by mouth at bedtime.     [provider]  VITAMIN E PO Take 1 capsule by mouth at bedtime.    [provider]    Allergies    Morphine and related, Penicillins, and Sulfa antibiotics  Review of Systems   Review of Systems  All other systems reviewed and are negative.   Physical Exam Updated Vital Signs BP (!) 137/53 (BP Location: Left Arm)   Pulse 71   Temp 99.1 F (37.3 C) (Oral)   Resp 14   SpO2 96%   Physical Exam Vitals and nursing note reviewed.  Constitutional:      Appearance: She is well-developed.  HENT:     Head: Normocephalic.  Cardiovascular:     Rate and Rhythm: Normal rate.  Pulmonary:     Effort: Pulmonary effort is normal.  Abdominal:     General: There is no distension.  Musculoskeletal:        General: Tenderness present.     Cervical back: Normal range of motion.     Comments: Decreased range of motion right shoulder.   Skin:    General: Skin is warm.  Neurological:     General: No focal deficit present.     Mental Status: She is alert and oriented to person, place, and time.  Psychiatric:        Mood and Affect: Mood normal.     ED Results / Procedures / Treatments   Labs (all labs ordered are listed, but only abnormal results are displayed) Labs Reviewed - No  data to display  EKG EKG Interpretation  Date/Time:  Thursday November 10 2019 07:47:53 EDT Ventricular Rate:  73 PR Interval:  168 QRS Duration: 154 QT Interval:  410 QTC Calculation: 451 R Axis:   -41 Text Interpretation: Normal sinus rhythm Left axis deviation Left bundle branch block Abnormal ECG since last tracing no significant change Confirmed by Noemi Chapel [phone]) on 11/10/2019 9:28:37 AM   Radiology DG Shoulder Right  Result Date: 11/10/2019 CLINICAL DATA:  Right shoulder pain.  Fall 1 month ago EXAM: RIGHT SHOULDER - 2+ VIEW COMPARISON:  07/02/2017 FINDINGS: No evidence of acute fracture. No dislocation. Severe arthropathy of the glenohumeral joint. Humeral head remains high-riding with remodeling of the undersurface of the acromion compatible with a chronic complete rotator cuff tear. Moderate AC joint arthropathy. Mineralized densities adjacent to the medial cortex of the proximal humeral shaft may reflect soft tissue calcifications versus mineralized densities within the biceps tendon sheath, unchanged. IMPRESSION: 1. No acute fracture or dislocation. 2. Severe arthropathy of the glenohumeral joint. 3. Chronic underlying rotator cuff tear. Electronically Signed   By: Davina Poke D.O.   On: 11/10/2019 08:27    Procedures Procedures (including critical care time)  Medications Ordered in ED Medications - No data to display  ED Course  I have reviewed the triage vital signs and the nursing notes.  Pertinent labs & imaging results that were available during my care of the patient were reviewed by me and considered in my medical decision making (see chart for details).    MDM Rules/Calculators/A&P                          MDM: Patient has multiple chronic issues she has chronic gout in her fingers she  has arthritis in her shoulder as well as chronic rotator cuff syndrome.  Patient has chronic knee problems followed by Dr. Maxie Better.  X-ray of patient's shoulder shows rotator  cuff disease as well as arthritis I discussed the results with patient.  I explained to her that needs to see her primary care doctor to discuss her gout management as well as discussing her chronic shoulder issues with Dr. Maxie Better.  Patient is requesting medication for pain I advised her that I will give her a short course she should discuss pain management with her medical doctor. Final Clinical Impression(s) / ED Diagnoses Final diagnoses:  Chronic right shoulder pain  Gout of hand, unspecified cause, unspecified chronicity, unspecified laterality    Rx / DC Orders ED Discharge Orders         Ordered    oxyCODONE-acetaminophen (PERCOCET) 5-325 MG tablet  Every 4 hours PRN     Discontinue  Reprint     11/10/19 1011        An After Visit Summary was printed and given to the patient.    Sidney Ace 11/10/19 1012    Noemi Chapel, MD 11/11/19 [phone]

## 2019-11-10 NOTE — ED Notes (Signed)
Patient verbalizes understanding of discharge instructions. Opportunity for questioning and answers were provided. Armband removed by staff, pt discharged from ED.  

## 2019-11-14 DIAGNOSIS — N183 Chronic kidney disease, stage 3 unspecified: Secondary | ICD-10-CM | POA: Diagnosis not present

## 2019-11-14 DIAGNOSIS — M10041 Idiopathic gout, right hand: Secondary | ICD-10-CM | POA: Diagnosis not present

## 2019-11-14 DIAGNOSIS — G250 Essential tremor: Secondary | ICD-10-CM | POA: Diagnosis not present

## 2019-11-14 DIAGNOSIS — E7800 Pure hypercholesterolemia, unspecified: Secondary | ICD-10-CM | POA: Diagnosis not present

## 2019-11-14 DIAGNOSIS — M25511 Pain in right shoulder: Secondary | ICD-10-CM | POA: Diagnosis not present

## 2019-11-14 DIAGNOSIS — G259 Extrapyramidal and movement disorder, unspecified: Secondary | ICD-10-CM | POA: Diagnosis not present

## 2019-11-14 DIAGNOSIS — Z0000 Encounter for general adult medical examination without abnormal findings: Secondary | ICD-10-CM | POA: Diagnosis not present

## 2019-11-14 DIAGNOSIS — K219 Gastro-esophageal reflux disease without esophagitis: Secondary | ICD-10-CM | POA: Diagnosis not present

## 2019-11-14 DIAGNOSIS — F419 Anxiety disorder, unspecified: Secondary | ICD-10-CM | POA: Diagnosis not present

## 2019-11-14 DIAGNOSIS — I10 Essential (primary) hypertension: Secondary | ICD-10-CM | POA: Diagnosis not present

## 2019-11-14 NOTE — Progress Notes (Signed)
Cardiology Clinic Note   Patient Name: NADJA LINA Date of Encounter: 11/15/2019  Primary Care Provider:  Shirline Frees, MD Primary Cardiologist:  Quay Burow, MD  Patient Profile    [name] 79 year old female presents today for a follow-up of her hypertension, CHF, DOE, and left bundle branch block.   Past Medical History    Past Medical History:  Diagnosis Date  . Anemia   . Arthritis   . Bell's palsy   . CHF (congestive heart failure) ([place])   . GERD (gastroesophageal reflux disease)   . Hemochromatosis   . History of hiatal hernia   . Hypertension   . Iron deficiency anemia due to chronic blood loss 03/10/2017  . PE (pulmonary embolism)   . Pneumonia    "walking" pneumonia  . Prothrombin gene mutation ([place]) 05/30/2013  . Restless legs   . Right leg DVT ([place]) 05/30/2013  . Stroke [facility])    found on a MRI, she's not aware otherwise   Past Surgical History:  Procedure Laterality Date  . BACK SURGERY    . COLONOSCOPY    . RIGHT/LEFT HEART CATH AND CORONARY ANGIOGRAPHY N/A 07/12/2019   Procedure: RIGHT/LEFT HEART CATH AND CORONARY ANGIOGRAPHY;  Surgeon: Troy Sine, MD;  Location: [facility] CV LAB;  Service: Cardiovascular;  Laterality: N/A;  . SHOULDER SURGERY Bilateral     Allergies  Allergies  Allergen Reactions  . Morphine And Related Other (See Comments)    LARGER DOSES OF MORPHINE CAUSES BODY TWITCHING  . Penicillins Anaphylaxis    Childhood reaction Has patient had a PCN reaction causing immediate rash, facial/tongue/throat swelling, SOB or lightheadedness with hypotension: Yes Has patient had a PCN reaction causing severe rash involving mucus membranes or skin necrosis: Unknown Has patient had a PCN reaction that required hospitalization: No Has patient had a PCN reaction occurring within the last 10 years: no (5 or 79 yrs old) If all of the above answers are "NO", then may proceed with Cephalosporin use.   . Sulfa Antibiotics Other  (See Comments)    Unknown childhood reaction    History of Present Illness    Ms. Kilman is a PMH of, right leg DVT (4287 on Xarelto complicated by PE), hypertension, left bundle branch block, GERD, hyperlipidemia, and CHF.  She does have a sister who has had CABG.  She may have also had a remote CVA.  Is noted to have progressive DOE over the last several years.  She was admitted to the hospital 3/21 for 4 days with CHF.  She underwent right and left heart cath by Dr. Claiborne Billings that showed 40% proximal LAD with elevated right atrial pressures, pulmonary artery pressure, wedge pressure and LVEDP.  She was diuresed at that time.  Her admission weight on 06/03/2019 was 188.  She was last seen by Dr. Gwenlyn Found on 08/16/2019.  During that time she denied chest pain.  Her weight was 172 pounds.  She was following a low-sodium diet, and was taking furosemide 80 mg daily.  She denied shortness of breath.  She presents to the clinic today for follow-up evaluation and states she feels well today.  She is having some right shoulder and right knee pain.  She was in the emergency department recently with right shoulder pain and was told it was related to arthritis.  She is also seeing orthopedics for her right knee pain.  She is planning to have a total knee replacement in the future.  She is unsure  whether she will pursue this or not.  She states that her blood pressure is better controlled and that she has had no chest discomfort.  Today she denies chest pain, shortness of breath, lower extremity edema, fatigue, palpitations, melena, hematuria, hemoptysis, diaphoresis, weakness, presyncope, syncope, orthopnea, and PND.   Home Medications    Prior to Admission medications   Medication Sig Start Date End Date Taking? Authorizing Provider  aspirin EC 81 MG tablet Take 162 mg by mouth daily.    [provider]  atorvastatin (LIPITOR) 40 MG tablet Take 1 tablet (40 mg total) by mouth daily at 6 PM. Patient not  taking: Reported on 09/15/2019 07/13/19   Oswald Hillock, MD  Biotin 5000 MCG TABS Take 5,000 mcg by mouth every morning.    [provider]  calcium carbonate (CALCIUM 600) 600 MG TABS tablet Take 600 mg by mouth daily with breakfast.    [provider]  cefdinir (OMNICEF) 300 MG capsule Take 1 capsule (300 mg total) by mouth 2 (two) times daily. 09/21/19   Caren Griffins, MD  cholecalciferol (VITAMIN D3) 25 MCG (1000 UNIT) tablet Take 1,000 Units by mouth 2 (two) times daily.     [provider]  citalopram (CELEXA) 20 MG tablet Take 20 mg by mouth at bedtime.  08/05/11   [provider]  colchicine 0.6 MG tablet Take 1 tablet (0.6 mg total) by mouth daily. Gout flare up. Patient taking differently: Take 0.6 mg by mouth daily as needed (gout flare).  12/02/16   Love, Ivan Anchors, PA-C  cycloSPORINE (RESTASIS) 0.05 % ophthalmic emulsion Place 1 drop into both eyes 2 (two) times daily.    [provider]  diclofenac Sodium (VOLTAREN) 1 % GEL Apply 4 g topically 4 (four) times daily. Patient not taking: Reported on 09/15/2019 08/10/19   Blanchie Dessert, MD  furosemide (LASIX) 80 MG tablet Take 1 tablet (80 mg total) by mouth daily. 07/13/19 07/12/20  Oswald Hillock, MD  Hypromellose (ARTIFICIAL TEARS OP) Place 1 drop into both eyes 3 (three) times daily as needed (dry eyes).    [provider]  losartan (COZAAR) 50 MG tablet Take 1 tablet (50 mg total) by mouth daily. 07/13/19   Oswald Hillock, MD  metoprolol succinate (TOPROL-XL) 25 MG 24 hr tablet Take 1 tablet (25 mg total) by mouth daily. 07/14/19   Oswald Hillock, MD  Multiple Vitamin (MULTIVITAMIN WITH MINERALS) TABS tablet Take 1 tablet by mouth daily. Centrum Silver    [provider]  Omega-3 Fatty Acids (FISH OIL) 1200 MG CAPS Take 1,200 mg by mouth daily.    [provider]  omeprazole (PRILOSEC) 20 MG capsule Take 20 mg by mouth daily. 07/04/19   [provider]    oxyCODONE-acetaminophen (PERCOCET) 5-325 MG tablet Take 1 tablet by mouth every 4 (four) hours as needed for severe pain. 11/10/19 11/09/20  Fransico Meadow, PA-C  polycarbophil (FIBERCON) 625 MG tablet Take 625 mg by mouth in the morning and at bedtime.    [provider]  pramipexole (MIRAPEX) 0.5 MG tablet Take 0.5 mg by mouth at bedtime.     [provider]  predniSONE (DELTASONE) 10 MG tablet Take 30 mg by mouth daily. 10/30/19   [provider]  primidone (MYSOLINE) 50 MG tablet Take 50 mg by mouth at bedtime. 11/09/19   [provider]  temazepam (RESTORIL) 15 MG capsule Take 1 capsule (15 mg total) by mouth at bedtime.  12/02/16   Love, Ivan Anchors, PA-C  Turmeric Curcumin 500 MG CAPS Take 500 mg by mouth at bedtime.     [provider]  VITAMIN E PO Take 1 capsule by mouth at bedtime.    [provider]    Family History    Family History  Problem Relation Age of Onset  . Congestive Heart Failure Mother   . Pancreatic cancer Father    She indicated that her mother is deceased. She indicated that her father is deceased.  Social History    Social History   Socioeconomic History  . Marital status: Married    Spouse name: Not on file  . Number of children: Not on file  . Years of education: Not on file  . Highest education level: Not on file  Occupational History  . Not on file  Tobacco Use  . Smoking status: Never Smoker  . Smokeless tobacco: Never Used  . Tobacco comment: never used tobacco  Vaping Use  . Vaping Use: Never used  Substance and Sexual Activity  . Alcohol use: No    Alcohol/week: 0.0 standard drinks  . Drug use: No  . Sexual activity: Not on file  Other Topics Concern  . Not on file  Social History Narrative  . Not on file   Social Determinants of Health   Financial Resource Strain:   . Difficulty of Paying Living Expenses:   Food Insecurity:   . Worried About Charity fundraiser in the Last Year:    . Arboriculturist in the Last Year:   Transportation Needs:   . Film/video editor (Medical):   Marland Kitchen Lack of Transportation (Non-Medical):   Physical Activity:   . Days of Exercise per Week:   . Minutes of Exercise per Session:   Stress:   . Feeling of Stress :   Social Connections:   . Frequency of Communication with Friends and Family:   . Frequency of Social Gatherings with Friends and Family:   . Attends Religious Services:   . Active Member of Clubs or Organizations:   . Attends Archivist Meetings:   Marland Kitchen Marital Status:   Intimate Partner Violence:   . Fear of Current or Ex-Partner:   . Emotionally Abused:   Marland Kitchen Physically Abused:   . Sexually Abused:      Review of Systems    General:  No chills, fever, night sweats or weight changes.  Cardiovascular:  No chest pain, dyspnea on exertion, edema, orthopnea, palpitations, paroxysmal nocturnal dyspnea. Dermatological: No rash, lesions/masses Respiratory: No cough, dyspnea Urologic: No hematuria, dysuria Abdominal:   No nausea, vomiting, diarrhea, bright red blood per rectum, melena, or hematemesis Neurologic:  No visual changes, wkns, changes in mental status. All other systems reviewed and are otherwise negative except as noted above.  Physical Exam    VS:  BP 132/80 (BP Location: Left Arm, Patient Position: Sitting, Cuff Size: Normal)   Pulse 64   Ht 5\' 3"  (1.6 m)   Wt 173 lb (78.5 kg)   SpO2 95%   BMI 30.65 kg/m  , BMI Body mass index is 30.65 kg/m. GEN: Well nourished, well developed, in no acute distress. HEENT: normal. Neck: Supple, no JVD, carotid bruits, or masses. Cardiac: RRR, no murmurs, rubs, or gallops. No clubbing, cyanosis, edema.  Radials/DP/PT 2+ and equal bilaterally.  Respiratory:  Respirations regular and unlabored, clear to auscultation bilaterally. GI: Soft, nontender, nondistended, BS + x 4. MS: no deformity  or atrophy. Skin: warm and dry, no rash. Neuro:  Strength and sensation  are intact. Psych: Normal affect.  Accessory Clinical Findings    Recent Labs: 07/09/2019: B Natriuretic Peptide 326.5 07/10/2019: TSH 0.420 07/13/2019: Magnesium 1.7 09/15/2019: ALT 21 09/20/2019: BUN 34; Creatinine, Ser 1.07; Hemoglobin 8.9; Platelets 226; Potassium 3.7; Sodium 143   Recent Lipid Panel    Component Value Date/Time   CHOL 152 07/10/2019 0525   TRIG 75 07/10/2019 0525   HDL 64 07/10/2019 0525   CHOLHDL 2.4 07/10/2019 0525   VLDL 15 07/10/2019 0525   LDLCALC 73 07/10/2019 0525    ECG personally reviewed by me today-none today.  EKG 11/11/2019 Normal sinus rhythm 73 bpm  Echocardiogram 06/20/2019  1. Mild global hypokinesis worse in the septum.. Left ventricular  ejection fraction, by estimation, is 45 to 50%. The left ventricle has  mildly decreased function. The left ventricle demonstrates global  hypokinesis. The left ventricular internal cavity  size was mildly dilated. There is mild asymmetric left ventricular  hypertrophy. Left ventricular diastolic parameters are consistent with  Grade I diastolic dysfunction (impaired relaxation). Elevated left  ventricular end-diastolic pressure.  2. Right ventricular systolic function is normal. The right ventricular  size is normal. There is normal pulmonary artery systolic pressure.  3. Left atrial size was moderately dilated.  4. The mitral valve is normal in structure and function. Mild mitral  valve regurgitation. No evidence of mitral stenosis.  5. The aortic valve is tricuspid. Aortic valve regurgitation is trivial.  No aortic stenosis is present.  6. The inferior vena cava is normal in size with greater than 50%  respiratory variability, suggesting right atrial pressure of 3 mmHg.  Cardiac catheterization 07/12/2019   Prox LAD lesion is 45% stenosed.   Mild coronary obstructive disease with a smooth area of 40 to 50% proximal LAD stenosis in the region of a large first diagonal vessel.  The remainder  of the coronary arteries are angiographically normal including a large ramus intermediate vessel, left circumflex vessel, and dominant RCA.  Mild right heart pressure elevation with PA systolic pressure at 37 and mean pressure 25 mmHg.  LVEDP: 25 mmHg  RECOMMENDATION: Medical therapy for mild CAD involving her proximal LAD with guideline directed medical therapy for LV dysfunction.   Assessment & Plan   1.  Essential hypertension-BP today 132/80.  Well-controlled at home Continue losartan, metoprolol Heart healthy low-sodium diet-salty 6 given Increase physical activity as tolerated  Chronic systolic congestive heart failure-no increased DOE today.  Euvolemic.  Echocardiogram 3/21 showed LVEF 45-50% with G1 DD and normal valvular function.  Cardiac catheterization 07/12/2019 showed 40% proximal LAD lesion with elevated right atrial pressures, wedge pressure and LVEDP. Continue furosemide, losartan, metoprolol Heart healthy low-sodium diet-salty 6 given Increase physical activity as tolerated Order BMP  Coronary artery disease-no chest pain today.Cardiac catheterization 07/12/2019 showed 40% proximal LAD lesion with elevated right atrial pressures, wedge pressure and LVEDP. Continue aspirin, atorvastatin, losartan, metoprolol, Heart healthy low-sodium diet-salty 6 given Increase physical activity as tolerated   Preoperative cardiac evaluation-     Primary Cardiologist: Quay Burow, MD  Chart reviewed as part of pre-operative protocol coverage. Given past medical history and time since last visit, based on ACC/AHA guidelines, ASHMI BLAS would be at acceptable risk for the planned procedure without further cardiovascular testing.   She has an RCRI class III risk, 6.6% risk of major cardiac event.  I will route this recommendation to the requesting party via [facility] fax  function and remove from pre-op pool.  Please call with questions.   Disposition: Follow-up with Dr.  Gwenlyn Found in 6 months.  [name] Cinzia Devos NP-C    11/15/2019, 9:50 AM [address] Office [phone] Fax [phone]

## 2019-11-15 ENCOUNTER — Telehealth: Payer: Self-pay

## 2019-11-15 ENCOUNTER — Ambulatory Visit: Payer: PPO | Admitting: Cardiovascular Disease

## 2019-11-15 ENCOUNTER — Ambulatory Visit: Payer: PPO | Admitting: General Practice

## 2019-11-15 ENCOUNTER — Encounter: Payer: Self-pay | Admitting: General Practice

## 2019-11-15 ENCOUNTER — Other Ambulatory Visit: Payer: Self-pay

## 2019-11-15 VITALS — BP 132/80 | HR 64 | Ht 63.0 in | Wt 173.0 lb

## 2019-11-15 DIAGNOSIS — I5043 Acute on chronic combined systolic (congestive) and diastolic (congestive) heart failure: Secondary | ICD-10-CM | POA: Diagnosis not present

## 2019-11-15 DIAGNOSIS — I2510 Atherosclerotic heart disease of native coronary artery without angina pectoris: Secondary | ICD-10-CM | POA: Diagnosis not present

## 2019-11-15 DIAGNOSIS — Z01810 Encounter for preprocedural cardiovascular examination: Secondary | ICD-10-CM

## 2019-11-15 DIAGNOSIS — I10 Essential (primary) hypertension: Secondary | ICD-10-CM

## 2019-11-15 LAB — BASIC METABOLIC PANEL
BUN/Creatinine Ratio: 28 (ref 12–28)
BUN: 31 mg/dL — ABNORMAL HIGH (ref 8–27)
CO2: 29 mmol/L (ref 20–29)
Calcium: 10.1 mg/dL (ref 8.7–10.3)
Chloride: 96 mmol/L (ref 96–106)
Creatinine, Ser: 1.1 mg/dL — ABNORMAL HIGH (ref 0.57–1.00)
GFR calc Af Amer: 55 mL/min/{1.73_m2} — ABNORMAL LOW (ref >59)
GFR calc non Af Amer: 48 mL/min/{1.73_m2} — ABNORMAL LOW (ref >59)
Glucose: 78 mg/dL (ref 65–99)
Potassium: 3.8 mmol/L (ref 3.5–5.2)
Sodium: 140 mmol/L (ref 134–144)

## 2019-11-15 NOTE — Telephone Encounter (Signed)
NOT NEEDED

## 2019-11-15 NOTE — Patient Instructions (Signed)
Medication Instructions:  The current medical regimen is effective;  continue present plan and medications as directed. Please refer to the Current Medication list given to you today. *If you need a refill on your cardiac medications before your next appointment, please call your pharmacy*  Lab Work: BMET TODAY If you have labs (blood work) drawn today and your tests are completely normal, you will receive your results only by:  [place] (if you have MyChart) OR A paper copy in the mail.  If you have any lab test that is abnormal or we need to change your treatment, we will call you to review the results. You may go to any [facility] that is convenient for you however, we do have a lab in our office that is able to assist you. You DO NOT need an appointment for our lab. The lab is open 8:00am and closes at 4:00pm. Lunch 12:45 - 1:45pm.  Special Instructions CLEARED FOR R-TKA  PLEASE READ AND FOLLOW SALTY 6-ATTACHED  PLEASE INCREASE PHYSICAL ACTIVITY AS TOLERATED  Follow-Up: Your next appointment:  6 month(s) Please call our office 2 months in advance to schedule this appointment In Person with Quay Burow, MD  At [facility], you and your health needs are our priority.  As part of our continuing mission to provide you with exceptional heart care, we have created designated Provider Care Teams.  These Care Teams include your primary Cardiologist (physician) and Advanced Practice Providers (APPs -  Physician Assistants and Nurse Practitioners) who all work together to provide you with the care you need, when you need it.  We recommend signing up for the patient portal called "MyChart".  Sign up information is provided on this After Visit Summary.  MyChart is used to connect with patients for Virtual Visits (Telemedicine).  Patients are able to view lab/test results, encounter notes, upcoming appointments, etc.  Non-urgent messages can be sent to your provider as well.   To learn more  about what you can do with MyChart, go to [email or web address].

## 2019-11-16 ENCOUNTER — Telehealth: Payer: Self-pay | Admitting: Cardiovascular Disease

## 2019-11-16 MED ORDER — METOPROLOL SUCCINATE ER 25 MG PO TB24: 25.0000 mg | ORAL_TABLET | Freq: Every day | ORAL | 3 refills | Status: DC

## 2019-11-16 NOTE — Telephone Encounter (Signed)
Rx has been sent to the pharmacy electronically. ° °

## 2019-11-16 NOTE — Telephone Encounter (Signed)
*  STAT* If patient is at the pharmacy, call can be transferred to refill team.   1. Which medications need to be refilled? (please list name of each medication and dose if known) metoprolol succinate (TOPROL-XL) 25 MG 24 hr tablet  2. Which pharmacy/location (including street and city if local pharmacy) is medication to be sent to? [facility] - [place], [place] - [address]  3. Do they need a 30 day or 90 day supply? 90  

## 2019-11-17 ENCOUNTER — Telehealth: Payer: Self-pay | Admitting: Family

## 2019-11-17 ENCOUNTER — Other Ambulatory Visit: Payer: Self-pay

## 2019-11-17 ENCOUNTER — Encounter: Payer: Self-pay | Admitting: Family

## 2019-11-17 ENCOUNTER — Inpatient Hospital Stay (HOSPITAL_BASED_OUTPATIENT_CLINIC_OR_DEPARTMENT_OTHER): Payer: PPO | Admitting: Family

## 2019-11-17 ENCOUNTER — Inpatient Hospital Stay: Payer: PPO | Attending: Hematology & Oncology

## 2019-11-17 VITALS — BP 145/56 | HR 70 | Temp 98.3°F | Resp 20 | Ht 63.0 in | Wt 173.0 lb

## 2019-11-17 DIAGNOSIS — Z7982 Long term (current) use of aspirin: Secondary | ICD-10-CM | POA: Diagnosis not present

## 2019-11-17 DIAGNOSIS — I82401 Acute embolism and thrombosis of unspecified deep veins of right lower extremity: Secondary | ICD-10-CM | POA: Diagnosis not present

## 2019-11-17 DIAGNOSIS — Z86718 Personal history of other venous thrombosis and embolism: Secondary | ICD-10-CM | POA: Insufficient documentation

## 2019-11-17 DIAGNOSIS — D500 Iron deficiency anemia secondary to blood loss (chronic): Secondary | ICD-10-CM | POA: Diagnosis not present

## 2019-11-17 DIAGNOSIS — D6852 Prothrombin gene mutation: Secondary | ICD-10-CM | POA: Diagnosis not present

## 2019-11-17 DIAGNOSIS — I509 Heart failure, unspecified: Secondary | ICD-10-CM | POA: Diagnosis not present

## 2019-11-17 LAB — COMPREHENSIVE METABOLIC PANEL
ALT: 20 U/L (ref 0–44)
AST: 28 U/L (ref 15–41)
Albumin: 3.9 g/dL (ref 3.5–5.0)
Alkaline Phosphatase: 64 U/L (ref 38–126)
Anion gap: 9 (ref 5–15)
BUN: 27 mg/dL — ABNORMAL HIGH (ref 8–23)
CO2: 33 mmol/L — ABNORMAL HIGH (ref 22–32)
Calcium: 10.4 mg/dL — ABNORMAL HIGH (ref 8.9–10.3)
Chloride: 95 mmol/L — ABNORMAL LOW (ref 98–111)
Creatinine, Ser: 1.29 mg/dL — ABNORMAL HIGH (ref 0.44–1.00)
GFR calc Af Amer: 46 mL/min — ABNORMAL LOW (ref >60)
GFR calc non Af Amer: 39 mL/min — ABNORMAL LOW (ref >60)
Glucose, Bld: 114 mg/dL — ABNORMAL HIGH (ref 70–99)
Potassium: 4.1 mmol/L (ref 3.5–5.1)
Sodium: 137 mmol/L (ref 135–145)
Total Bilirubin: 0.5 mg/dL (ref 0.3–1.2)
Total Protein: 6.9 g/dL (ref 6.5–8.1)

## 2019-11-17 LAB — CBC WITH DIFFERENTIAL (CANCER CENTER ONLY)
Abs Immature Granulocytes: 0.08 10*3/uL — ABNORMAL HIGH (ref 0.00–0.07)
Basophils Absolute: 0 10*3/uL (ref 0.0–0.1)
Basophils Relative: 0 %
Eosinophils Absolute: 0.1 10*3/uL (ref 0.0–0.5)
Eosinophils Relative: 1 %
HCT: 34.8 % — ABNORMAL LOW (ref 36.0–46.0)
Hemoglobin: 11.3 g/dL — ABNORMAL LOW (ref 12.0–15.0)
Immature Granulocytes: 1 %
Lymphocytes Relative: 12 %
Lymphs Abs: 1.1 10*3/uL (ref 0.7–4.0)
MCH: 29.8 pg (ref 26.0–34.0)
MCHC: 32.5 g/dL (ref 30.0–36.0)
MCV: 91.8 fL (ref 80.0–100.0)
Monocytes Absolute: 0.8 10*3/uL (ref 0.1–1.0)
Monocytes Relative: 8 %
Neutro Abs: 7.1 10*3/uL (ref 1.7–7.7)
Neutrophils Relative %: 78 %
Platelet Count: 233 10*3/uL (ref 150–400)
RBC: 3.79 MIL/uL — ABNORMAL LOW (ref 3.87–5.11)
RDW: 16.1 % — ABNORMAL HIGH (ref 11.5–15.5)
WBC Count: 9.1 10*3/uL (ref 4.0–10.5)
nRBC: 0 % (ref 0.0–0.2)

## 2019-11-17 NOTE — Telephone Encounter (Signed)
Appointments scheduled calendar printed & mailed per 7/22 los 

## 2019-11-17 NOTE — Progress Notes (Signed)
Hematology and Oncology Follow Up Visit  MOANI WEIPERT [id number] 12/11/1940 79 y.o. 11/17/2019   Principle Diagnosis:  DVT of the right leg Prothrombin II gene mutation Hemachromatosis (homozygous for C282Y Mutation) Past history of right lower extremity DVT/PE Iron deficiency anemia secondary to blood loss  Current Therapy:        Aspirin 162 mg PO daily IV iron as indicated    Interim History:  Ms. Inabinet is here today for follow-up. She has had a rough 6 months with a hospitalization in May for CHF and AKI. She is home again and slowly recuperating.  She still has some fatigue but is ambulating with her walker for added support.  She had a fall in the bathtub of their camper while at the beach in June. Thankfully, she was not seriously injured.  She denies syncope.  She needs a total knee replacement with Dr. Tonita Cong and he has sent a medical clearance form for [place] to fill out. We will take care of this today.  She has also been having right flank and shoulder pain. She has a chronic rotator cuff tear but is unsure as to why she has the flank pain. So far she states that her work up for that has been negative.  She is doing well on 2 baby aspirin a day and has not had any new recurrent thrombus. She has not noted any bleeding but does states that she bruises easily.  She is currently on an iron supplement as she was told she is anemic. Iron studies are pending.  No swelling in her extremities at this time.  She has numbness and tingling that comes and goes in her fingers and toes.  She states that she has a good appetite and is hydrating appropriately. Her weight is stable.   ECOG Performance Status: 2 - Symptomatic, <50% confined to bed  Medications:  Allergies as of 11/17/2019      Reactions   Morphine And Related Other (See Comments)   LARGER DOSES OF MORPHINE CAUSES BODY TWITCHING   Penicillins Anaphylaxis   Childhood reaction Has patient had a PCN reaction causing  immediate rash, facial/tongue/throat swelling, SOB or lightheadedness with hypotension: Yes Has patient had a PCN reaction causing severe rash involving mucus membranes or skin necrosis: Unknown Has patient had a PCN reaction that required hospitalization: No Has patient had a PCN reaction occurring within the last 10 years: no (5 or 79 yrs old) If all of the above answers are "NO", then may proceed with Cephalosporin use.   Sulfa Antibiotics Other (See Comments)   Unknown childhood reaction      Medication List       Accurate as of November 17, 2019 11:40 AM. If you have any questions, ask your nurse or doctor.        ARTIFICIAL TEARS OP Place 1 drop into both eyes 3 (three) times daily as needed (dry eyes).   aspirin EC 81 MG tablet Take 162 mg by mouth daily. Swallow whole.   atorvastatin 40 MG tablet Commonly known as: LIPITOR Take 1 tablet (40 mg total) by mouth daily at 6 PM.   Biotin 5000 MCG Tabs Take 5,000 mcg by mouth every morning.   Calcium 600 600 MG Tabs tablet Generic drug: calcium carbonate Take 600 mg by mouth daily with breakfast.   cholecalciferol 25 MCG (1000 UNIT) tablet Commonly known as: VITAMIN D3 Take 1,000 Units by mouth 2 (two) times daily.   citalopram 20 MG tablet  Commonly known as: CELEXA Take 20 mg by mouth at bedtime.   colchicine 0.6 MG tablet Take 1 tablet (0.6 mg total) by mouth daily. Gout flare up. What changed:   when to take this  reasons to take this  additional instructions   cycloSPORINE 0.05 % ophthalmic emulsion Commonly known as: RESTASIS Place 1 drop into both eyes 2 (two) times daily.   FiberCon 625 MG tablet Generic drug: polycarbophil Take 625 mg by mouth in the morning and at bedtime.   Fish Oil 1200 MG Caps Take 1,200 mg by mouth daily.   furosemide 80 MG tablet Commonly known as: Lasix Take 1 tablet (80 mg total) by mouth daily.   losartan 50 MG tablet Commonly known as: COZAAR Take 1 tablet (50 mg  total) by mouth daily.   metoprolol succinate 25 MG 24 hr tablet Commonly known as: TOPROL-XL Take 1 tablet (25 mg total) by mouth daily.   multivitamin with minerals Tabs tablet Take 1 tablet by mouth daily. Centrum Silver   omeprazole 20 MG capsule Commonly known as: PRILOSEC Take 20 mg by mouth daily.   temazepam 15 MG capsule Commonly known as: RESTORIL Take 1 capsule (15 mg total) by mouth at bedtime.   Turmeric Curcumin 500 MG Caps Take 500 mg by mouth at bedtime.   VITAMIN E PO Take 1 capsule by mouth at bedtime.       Allergies:  Allergies  Allergen Reactions  . Morphine And Related Other (See Comments)    LARGER DOSES OF MORPHINE CAUSES BODY TWITCHING  . Penicillins Anaphylaxis    Childhood reaction Has patient had a PCN reaction causing immediate rash, facial/tongue/throat swelling, SOB or lightheadedness with hypotension: Yes Has patient had a PCN reaction causing severe rash involving mucus membranes or skin necrosis: Unknown Has patient had a PCN reaction that required hospitalization: No Has patient had a PCN reaction occurring within the last 10 years: no (5 or 79 yrs old) If all of the above answers are "NO", then may proceed with Cephalosporin use.   . Sulfa Antibiotics Other (See Comments)    Unknown childhood reaction    Past Medical History, Surgical history, Social history, and Family History were reviewed and updated.  Review of Systems: All other 10 point review of systems is negative.   Physical Exam:  vitals were not taken for this visit.   Wt Readings from Last 3 Encounters:  11/15/19 173 lb (78.5 kg)  09/21/19 185 lb 10 oz (84.2 kg)  08/16/19 172 lb 12.8 oz (78.4 kg)    Ocular: Sclerae unicteric, pupils equal, round and reactive to light Ear-nose-throat: Oropharynx clear, dentition fair Lymphatic: No cervical or supraclavicular adenopathy Lungs no rales or rhonchi, good excursion bilaterally Heart regular rate and rhythm, no  murmur appreciated Abd soft, nontender, positive bowel sounds, no liver or spleen tip palpated on exam, no fluid wave  MSK no focal spinal tenderness, no joint edema Neuro: non-focal, well-oriented, appropriate affect Breasts: Deferred   Lab Results  Component Value Date   WBC 9.1 11/17/2019   HGB 11.3 (L) 11/17/2019   HCT 34.8 (L) 11/17/2019   MCV 91.8 11/17/2019   PLT 233 11/17/2019   Lab Results  Component Value Date   FERRITIN 155 09/16/2019   IRON 15 (L) 09/16/2019   TIBC 188 (L) 09/16/2019   UIBC 173 09/16/2019   IRONPCTSAT 8 (L) 09/16/2019   Lab Results  Component Value Date   RETICCTPCT 0.9 09/16/2019   RBC 3.79 (L)  11/17/2019   RETICCTABS 39.7 05/11/2014   No results found for: KPAFRELGTCHN, LAMBDASER, KAPLAMBRATIO No results found for: Kandis Cocking, IGMSERUM No results found for: Odetta Pink, SPEI   Chemistry      Component Value Date/Time   NA 137 11/17/2019 1048   NA 140 11/15/2019 1023   NA 144 04/13/2017 1134   NA 142 07/28/2016 1144   K 4.1 11/17/2019 1048   K 3.3 04/13/2017 1134   K 3.6 07/28/2016 1144   CL 95 (L) 11/17/2019 1048   CL 97 (L) 04/13/2017 1134   CO2 33 (H) 11/17/2019 1048   CO2 32 04/13/2017 1134   CO2 33 (H) 07/28/2016 1144   BUN 27 (H) 11/17/2019 1048   BUN 31 (H) 11/15/2019 1023   BUN 18 04/13/2017 1134   BUN 22.6 07/28/2016 1144   CREATININE 1.29 (H) 11/17/2019 1048   CREATININE 1.04 (H) 05/09/2019 1456   CREATININE 1.0 04/13/2017 1134   CREATININE 1.0 07/28/2016 1144      Component Value Date/Time   CALCIUM 10.4 (H) 11/17/2019 1048   CALCIUM 10.1 04/13/2017 1134   CALCIUM 9.5 07/28/2016 1144   ALKPHOS 64 11/17/2019 1048   ALKPHOS 103 (H) 04/13/2017 1134   ALKPHOS 84 07/28/2016 1144   AST 28 11/17/2019 1048   AST 23 05/09/2019 1456   AST 24 07/28/2016 1144   ALT 20 11/17/2019 1048   ALT 17 05/09/2019 1456   ALT 27 04/13/2017 1134   ALT 17 07/28/2016 1144    BILITOT 0.5 11/17/2019 1048   BILITOT 0.4 05/09/2019 1456   BILITOT 0.41 07/28/2016 1144       Impression and Plan: Ms. Childress is a very pleasant 79 yo caucasian female with a chronic DVT in the right lower extremity.  She has not had any new recurrence and is doing well on 2 baby aspirin daily.  The hemochromatosis and IDA have not been much of an issue for her. Iron studies are pending.  Surgical clearance form for knee replacement was filled out and signed by Dr. Marin Olp and form returned to [place].  We will plan to see her again in 6 months.  She can contact our office with any questions or concerns. We can certainly see her sooner if needed.   Laverna Peace, NP 7/22/202111:40 AM

## 2019-11-18 LAB — IRON AND TIBC
Iron: 24 ug/dL — ABNORMAL LOW (ref 41–142)
Saturation Ratios: 11 % — ABNORMAL LOW (ref 21–57)
TIBC: 214 ug/dL — ABNORMAL LOW (ref 236–444)
UIBC: 190 ug/dL (ref 120–384)

## 2019-11-18 LAB — FERRITIN: Ferritin: 253 ng/mL (ref 11–307)

## 2019-11-21 ENCOUNTER — Telehealth: Payer: Self-pay | Admitting: *Deleted

## 2019-11-21 NOTE — Telephone Encounter (Signed)
As noted below by Dr. Marin Olp, I informed the patient that the iron is low, but there is no need for IV iron. She verbalized understanding.

## 2019-11-21 NOTE — Telephone Encounter (Signed)
-----   Message from Volanda Napoleon, MD sent at 11/18/2019  4:48 PM EDT ----- Call - the iron is low, but no need for IV iron.  Laurey Arrow

## 2019-12-01 DIAGNOSIS — K219 Gastro-esophageal reflux disease without esophagitis: Secondary | ICD-10-CM | POA: Diagnosis not present

## 2019-12-01 DIAGNOSIS — N159 Renal tubulo-interstitial disease, unspecified: Secondary | ICD-10-CM | POA: Diagnosis not present

## 2019-12-01 DIAGNOSIS — F329 Major depressive disorder, single episode, unspecified: Secondary | ICD-10-CM | POA: Diagnosis not present

## 2019-12-01 DIAGNOSIS — M1990 Unspecified osteoarthritis, unspecified site: Secondary | ICD-10-CM | POA: Diagnosis not present

## 2019-12-01 DIAGNOSIS — M19041 Primary osteoarthritis, right hand: Secondary | ICD-10-CM | POA: Diagnosis not present

## 2019-12-01 DIAGNOSIS — E7800 Pure hypercholesterolemia, unspecified: Secondary | ICD-10-CM | POA: Diagnosis not present

## 2019-12-01 DIAGNOSIS — I10 Essential (primary) hypertension: Secondary | ICD-10-CM | POA: Diagnosis not present

## 2019-12-01 DIAGNOSIS — M8580 Other specified disorders of bone density and structure, unspecified site: Secondary | ICD-10-CM | POA: Diagnosis not present

## 2019-12-01 DIAGNOSIS — D508 Other iron deficiency anemias: Secondary | ICD-10-CM | POA: Diagnosis not present

## 2019-12-01 DIAGNOSIS — N183 Chronic kidney disease, stage 3 unspecified: Secondary | ICD-10-CM | POA: Diagnosis not present

## 2019-12-09 DIAGNOSIS — M542 Cervicalgia: Secondary | ICD-10-CM | POA: Diagnosis not present

## 2019-12-09 DIAGNOSIS — M25511 Pain in right shoulder: Secondary | ICD-10-CM | POA: Diagnosis not present

## 2019-12-09 DIAGNOSIS — M19011 Primary osteoarthritis, right shoulder: Secondary | ICD-10-CM | POA: Diagnosis not present

## 2019-12-14 ENCOUNTER — Telehealth: Payer: Self-pay | Admitting: Hematology & Oncology

## 2019-12-14 NOTE — Telephone Encounter (Signed)
[employer] called requesting a faxed note stating OK for patient to get IVC filter placed when she has TJR w/ Dr Susa Day @ [facility].  I faxed a note over to her at [phone]

## 2019-12-16 ENCOUNTER — Encounter: Payer: Self-pay | Admitting: *Deleted

## 2019-12-16 ENCOUNTER — Other Ambulatory Visit: Payer: Self-pay | Admitting: *Deleted

## 2019-12-20 ENCOUNTER — Ambulatory Visit: Payer: Self-pay | Admitting: Orthopedic Surgery

## 2020-01-03 ENCOUNTER — Ambulatory Visit: Payer: Self-pay | Admitting: Orthopedic Surgery

## 2020-01-03 ENCOUNTER — Other Ambulatory Visit: Payer: Self-pay | Admitting: Orthopedic Surgery

## 2020-01-05 ENCOUNTER — Encounter (HOSPITAL_COMMUNITY): Admission: RE | Admit: 2020-01-05 | Payer: PPO | Source: Ambulatory Visit

## 2020-01-07 ENCOUNTER — Other Ambulatory Visit (HOSPITAL_COMMUNITY): Payer: PPO

## 2020-01-20 DIAGNOSIS — M1990 Unspecified osteoarthritis, unspecified site: Secondary | ICD-10-CM | POA: Diagnosis not present

## 2020-01-20 DIAGNOSIS — E7800 Pure hypercholesterolemia, unspecified: Secondary | ICD-10-CM | POA: Diagnosis not present

## 2020-01-20 DIAGNOSIS — I10 Essential (primary) hypertension: Secondary | ICD-10-CM | POA: Diagnosis not present

## 2020-01-20 DIAGNOSIS — D508 Other iron deficiency anemias: Secondary | ICD-10-CM | POA: Diagnosis not present

## 2020-01-20 DIAGNOSIS — K219 Gastro-esophageal reflux disease without esophagitis: Secondary | ICD-10-CM | POA: Diagnosis not present

## 2020-01-20 DIAGNOSIS — F329 Major depressive disorder, single episode, unspecified: Secondary | ICD-10-CM | POA: Diagnosis not present

## 2020-01-20 DIAGNOSIS — N183 Chronic kidney disease, stage 3 unspecified: Secondary | ICD-10-CM | POA: Diagnosis not present

## 2020-01-20 DIAGNOSIS — M19041 Primary osteoarthritis, right hand: Secondary | ICD-10-CM | POA: Diagnosis not present

## 2020-01-20 DIAGNOSIS — N159 Renal tubulo-interstitial disease, unspecified: Secondary | ICD-10-CM | POA: Diagnosis not present

## 2020-01-20 DIAGNOSIS — M8580 Other specified disorders of bone density and structure, unspecified site: Secondary | ICD-10-CM | POA: Diagnosis not present

## 2020-01-31 ENCOUNTER — Other Ambulatory Visit: Payer: Self-pay

## 2020-01-31 ENCOUNTER — Ambulatory Visit (INDEPENDENT_AMBULATORY_CARE_PROVIDER_SITE_OTHER): Payer: PPO | Admitting: Cardiovascular Disease

## 2020-01-31 ENCOUNTER — Encounter: Payer: Self-pay | Admitting: Cardiovascular Disease

## 2020-01-31 DIAGNOSIS — I10 Essential (primary) hypertension: Secondary | ICD-10-CM

## 2020-01-31 DIAGNOSIS — R0609 Other forms of dyspnea: Secondary | ICD-10-CM

## 2020-01-31 DIAGNOSIS — R0600 Dyspnea, unspecified: Secondary | ICD-10-CM

## 2020-01-31 DIAGNOSIS — E785 Hyperlipidemia, unspecified: Secondary | ICD-10-CM | POA: Insufficient documentation

## 2020-01-31 DIAGNOSIS — E782 Mixed hyperlipidemia: Secondary | ICD-10-CM | POA: Diagnosis not present

## 2020-01-31 NOTE — Assessment & Plan Note (Signed)
History of dyspnea on exertion most likely related to diastolic dysfunction.  She did have a right left heart cath performed by Dr. Claiborne Billings 07/09/2019 during hospitalization for heart failure.  She was found to have noncritical CAD with elevated LVEDP, wedge pressure and pulmonary artery pressures.  She was diuresed.  She is on oral diuretics and is aware of salt restriction.  She denies shortness of breath or weight gain.

## 2020-01-31 NOTE — Patient Instructions (Signed)
  Follow-Up: At [facility], you and your health needs are our priority.  As part of our continuing mission to provide you with exceptional heart care, we have created designated Provider Care Teams.  These Care Teams include your primary Cardiologist (physician) and Advanced Practice Providers (APPs -  Physician Assistants and Nurse Practitioners) who all work together to provide you with the care you need, when you need it.  We recommend signing up for the patient portal called "MyChart".  Sign up information is provided on this After Visit Summary.  MyChart is used to connect with patients for Virtual Visits (Telemedicine).  Patients are able to view lab/test results, encounter notes, upcoming appointments, etc.  Non-urgent messages can be sent to your provider as well.   To learn more about what you can do with MyChart, go to [email or web address].    Your next appointment:   6 month(s)  The format for your next appointment:   In Person  Provider:   You will see one of the following Advanced Practice Providers on your designated Care Team:    Kerin Ransom, PA-C  [place], [place]  Coletta Memos, [place]  Then, Quay Burow, MD will plan to see you again in 12 month(s).

## 2020-01-31 NOTE — Assessment & Plan Note (Signed)
History of essential hypertension a blood pressure measured today at 138/72.  She is on losartan and metoprolol.

## 2020-01-31 NOTE — Progress Notes (Signed)
01/31/2020 [name]   [birth year]  [id number]  Primary Physician Shirline Frees, MD Primary Cardiologist: Lorretta Harp MD Lupe Carney, [place]  HPI:  [name] is a 79 y.o.  moderately overweight married Caucasian female mother of 2 children, grandmother of 82 grandchildren referred by Dr. Kenton Kingfisher for evaluation of dyspnea on exertion. She is retired from being in accounts payable in data entry.  I last saw her in the office 08/16/2019.Her risk factors include treated hypertension and mild untreated hyperlipidemia. One of her sisters did have CABG. There is a question that she has had a remote stroke. She is never had a heart attack. She denies chest pain. She does have GERD. She has had right lower extremity DVT 10 years ago complicated by pulmonary embolism on Xarelto. She says over the last several years she said progressive dyspnea on exertion.  She was admitted to the hospital 07/09/2019 for 4 days and heart failure.  She underwent right and left heart cath by Dr. Claiborne Billings revealing almost 40% proximal LAD lesion with elevated right atrial pressure, pulmonary artery pressure, wedge pressure and LVEDP.  She was diuresed and currently weighs 172 pounds down from 188 pounds on 06/03/2019.  She is aware of salt restriction.  She is on furosemide 80 mg a day and currently denies shortness of breath.  She does have a history of thrombophilia with prothrombin gene mutation.  She is had DVT in the past as well as pulmonary emboli.  She has been on Xarelto several times in the past most recently 2 years ago.  She is scheduled to have an IVC filter placed in the next week or 2 in anticipation of right total knee replacement scheduled in several weeks by Dr. Maxie Better which I have given her clearance for.   Current Meds  Medication Sig  . aspirin EC 81 MG tablet Take 162 mg by mouth daily. Swallow whole.  Marland Kitchen atorvastatin (LIPITOR) 40 MG tablet Take 1 tablet (40 mg total) by mouth daily  at 6 PM.  . Biotin 5000 MCG TABS Take 5,000 mcg by mouth daily.   . Calcium Carb-Cholecalciferol (CALCIUM 600+D3 PO) Take 1 tablet by mouth in the morning and at bedtime.  . Cholecalciferol (VITAMIN D3) 50 MCG (2000 UT) TABS Take 2,000 Units by mouth in the morning and at bedtime.  . citalopram (CELEXA) 20 MG tablet Take 20 mg by mouth at bedtime.   . cycloSPORINE (RESTASIS) 0.05 % ophthalmic emulsion Place 1 drop into both eyes 2 (two) times daily.  . ferrous sulfate 325 (65 FE) MG EC tablet Take 325 mg by mouth daily.  . furosemide (LASIX) 80 MG tablet Take 1 tablet (80 mg total) by mouth daily.  . Hypromellose (ARTIFICIAL TEARS OP) Place 1 drop into both eyes 3 (three) times daily as needed (dry eyes).  Marland Kitchen losartan (COZAAR) 50 MG tablet Take 1 tablet (50 mg total) by mouth daily.  . metoprolol succinate (TOPROL-XL) 25 MG 24 hr tablet Take 1 tablet (25 mg total) by mouth daily.  . Multiple Vitamin (MULTIVITAMIN WITH MINERALS) TABS tablet Take 1 tablet by mouth daily. Centrum Silver (NO IRON)  . Omega-3 Fatty Acids (FISH OIL PO) Take 2,000 mg by mouth daily.  . Omega-3 Fatty Acids (FISH OIL) 1200 MG CAPS Take 1,200 mg by mouth daily.  Marland Kitchen omeprazole (PRILOSEC) 20 MG capsule Take 20 mg by mouth daily before breakfast.   . polycarbophil (FIBERCON) 625 MG tablet Take 625 mg  by mouth in the morning and at bedtime.  . pramipexole (MIRAPEX) 0.5 MG tablet Take 0.5 mg by mouth daily in the afternoon.  . temazepam (RESTORIL) 15 MG capsule Take 1 capsule (15 mg total) by mouth at bedtime.  . Turmeric Curcumin 500 MG CAPS Take 500 mg by mouth at bedtime.   . vitamin E 180 MG (400 UNITS) capsule Take 400 Units by mouth at bedtime.     Allergies  Allergen Reactions  . Morphine And Related Other (See Comments)    LARGER DOSES OF MORPHINE CAUSES BODY TWITCHING  . Penicillins Anaphylaxis    Childhood reaction Has patient had a PCN reaction causing immediate rash, facial/tongue/throat swelling, SOB or  lightheadedness with hypotension: Yes Has patient had a PCN reaction causing severe rash involving mucus membranes or skin necrosis: Unknown Has patient had a PCN reaction that required hospitalization: No Has patient had a PCN reaction occurring within the last 10 years: no (5 or 79 yrs old) If all of the above answers are "NO", then may proceed with Cephalosporin use.   . Sulfa Antibiotics Other (See Comments)    Unknown childhood reaction    Social History   Socioeconomic History  . Marital status: Married    Spouse name: Not on file  . Number of children: Not on file  . Years of education: Not on file  . Highest education level: Not on file  Occupational History  . Not on file  Tobacco Use  . Smoking status: Never Smoker  . Smokeless tobacco: Never Used  . Tobacco comment: never used tobacco  Vaping Use  . Vaping Use: Never used  Substance and Sexual Activity  . Alcohol use: No    Alcohol/week: 0.0 standard drinks  . Drug use: No  . Sexual activity: Not on file  Other Topics Concern  . Not on file  Social History Narrative  . Not on file   Social Determinants of Health   Financial Resource Strain:   . Difficulty of Paying Living Expenses: Not on file  Food Insecurity:   . Worried About Charity fundraiser in the Last Year: Not on file  . Ran Out of Food in the Last Year: Not on file  Transportation Needs:   . Lack of Transportation (Medical): Not on file  . Lack of Transportation (Non-Medical): Not on file  Physical Activity:   . Days of Exercise per Week: Not on file  . Minutes of Exercise per Session: Not on file  Stress:   . Feeling of Stress : Not on file  Social Connections:   . Frequency of Communication with Friends and Family: Not on file  . Frequency of Social Gatherings with Friends and Family: Not on file  . Attends Religious Services: Not on file  . Active Member of Clubs or Organizations: Not on file  . Attends Archivist Meetings: Not  on file  . Marital Status: Not on file  Intimate Partner Violence:   . Fear of Current or Ex-Partner: Not on file  . Emotionally Abused: Not on file  . Physically Abused: Not on file  . Sexually Abused: Not on file     Review of Systems: General: negative for chills, fever, night sweats or weight changes.  Cardiovascular: negative for chest pain, dyspnea on exertion, edema, orthopnea, palpitations, paroxysmal nocturnal dyspnea or shortness of breath Dermatological: negative for rash Respiratory: negative for cough or wheezing Urologic: negative for hematuria Abdominal: negative for nausea, vomiting, diarrhea, bright  [name] blood per rectum, melena, or hematemesis Neurologic: negative for visual changes, syncope, or dizziness All other systems reviewed and are otherwise negative except as noted above.    Blood pressure 138/72, pulse 70, height 5\' 3"  (1.6 m), weight 173 lb (78.5 kg).  General appearance: alert and no distress Neck: no adenopathy, no carotid bruit, no JVD, supple, symmetrical, trachea midline and thyroid not enlarged, symmetric, no tenderness/mass/nodules Lungs: clear to auscultation bilaterally Heart: regular rate and rhythm, S1, S2 normal, no murmur, click, rub or gallop Extremities: extremities normal, atraumatic, no cyanosis or edema Pulses: 2+ and symmetric Skin: Skin color, texture, turgor normal. No rashes or lesions Neurologic: Alert and oriented X 3, normal strength and tone. Normal symmetric reflexes. Normal coordination and gait  EKG not performed today  ASSESSMENT AND PLAN:   HTN (hypertension) History of essential hypertension a blood pressure measured today at 138/72.  She is on losartan and metoprolol.  Dyspnea on exertion History of dyspnea on exertion most likely related to diastolic dysfunction.  She did have a right left heart cath performed by Dr. Claiborne Billings 07/09/2019 during hospitalization for heart failure.  She was found to have noncritical CAD with  elevated LVEDP, wedge pressure and pulmonary artery pressures.  She was diuresed.  She is on oral diuretics and is aware of salt restriction.  She denies shortness of breath or weight gain.  Hyperlipidemia History of hyperlipidemia on atorvastatin with lipid profile performed 07/10/2019 revealing total cholesterol 152, LDL 73 and HDL 64.      Lorretta Harp MD [facility], [facility] 01/31/2020 10:28 AM

## 2020-01-31 NOTE — Assessment & Plan Note (Signed)
History of hyperlipidemia on atorvastatin with lipid profile performed 07/10/2019 revealing total cholesterol 152, LDL 73 and HDL 64.

## 2020-02-01 DIAGNOSIS — E669 Obesity, unspecified: Secondary | ICD-10-CM | POA: Diagnosis not present

## 2020-02-01 DIAGNOSIS — M5417 Radiculopathy, lumbosacral region: Secondary | ICD-10-CM | POA: Diagnosis not present

## 2020-02-01 DIAGNOSIS — M542 Cervicalgia: Secondary | ICD-10-CM | POA: Diagnosis not present

## 2020-02-01 DIAGNOSIS — M150 Primary generalized (osteo)arthritis: Secondary | ICD-10-CM | POA: Diagnosis not present

## 2020-02-01 DIAGNOSIS — Z6830 Body mass index (BMI) 30.0-30.9, adult: Secondary | ICD-10-CM | POA: Diagnosis not present

## 2020-02-01 DIAGNOSIS — M1120 Other chondrocalcinosis, unspecified site: Secondary | ICD-10-CM | POA: Diagnosis not present

## 2020-02-01 DIAGNOSIS — M2550 Pain in unspecified joint: Secondary | ICD-10-CM | POA: Diagnosis not present

## 2020-02-01 DIAGNOSIS — M1009 Idiopathic gout, multiple sites: Secondary | ICD-10-CM | POA: Diagnosis not present

## 2020-02-02 ENCOUNTER — Ambulatory Visit: Payer: Self-pay | Admitting: Orthopedic Surgery

## 2020-02-02 NOTE — H&P (Signed)
[name] is an 79 y.o. female.   Chief Complaint: R knee pain HPI: The patient is scheduled for a right total knee arthroplasty by Dr. Tonita Cong on 02/15/20 at [facility].  She has failed conservative treatment including injections, bracing, strengthening, medications, activity modifications and relative rest. Pain is interfering with quality-of-life and activities of daily living at this point. Dr. Tonita Cong and the patient mutually agreed to proceed with a right total knee replacement. Risks and benefits of the procedure were discussed including stiffness, suboptimal range of motion, persistent pain, infection requiring removal of prosthesis and reinsertion, need for prophylactic antibiotics in the future, for example, dental procedures, possible need for manipulation, revision in the future and also anesthetic complications including DVT, PE, etc. We discussed the perioperative course, time in the hospital, postoperative recovery and the need for elevation to control swelling. We also discussed the predicted range of motion and the probability that squatting and kneeling would be unobtainable in the future. In addition, postoperative anticoagulation was discussed. We have obtained preoperative medical clearance as necessary. Provided illustrated handout and discussed it in detail. They will enroll in the total joint replacement educational forum at the hospital.  She has been cleared medically and has been scheduled for a preoperative IVC filter on 10/18 with Dr. Donzetta Matters due to prior hx of DVT and PE. She does have a history of gout.  Past Medical History:  Diagnosis Date  . Anemia   . Arthritis   . Bell's palsy   . CHF (congestive heart failure) ([place])   . GERD (gastroesophageal reflux disease)   . Hemochromatosis   . History of hiatal hernia   . Hypertension   . Iron deficiency anemia due to chronic blood loss 03/10/2017  . PE (pulmonary embolism)   . Pneumonia    "walking" pneumonia   . Prothrombin gene mutation ([place]) 05/30/2013  . Restless legs   . Right leg DVT ([place]) 05/30/2013  . Stroke [facility])    found on a MRI, she's not aware otherwise    Past Surgical History:  Procedure Laterality Date  . BACK SURGERY    . COLONOSCOPY    . RIGHT/LEFT HEART CATH AND CORONARY ANGIOGRAPHY N/A 07/12/2019   Procedure: RIGHT/LEFT HEART CATH AND CORONARY ANGIOGRAPHY;  Surgeon: Troy Sine, MD;  Location: [place] [facility];  Service: Cardiovascular;  Laterality: N/A;  . SHOULDER SURGERY Bilateral     Family History  Problem Relation Age of Onset  . Congestive Heart Failure Mother   . Pancreatic cancer Father    Social History:  reports that she has never smoked. She has never used smokeless tobacco. She reports that she does not drink alcohol and does not use drugs.  Allergies:  Allergies  Allergen Reactions  . Morphine And Related Other (See Comments)    LARGER DOSES OF MORPHINE CAUSES BODY TWITCHING  . Penicillins Anaphylaxis    Childhood reaction Has patient had a PCN reaction causing immediate rash, facial/tongue/throat swelling, SOB or lightheadedness with hypotension: Yes Has patient had a PCN reaction causing severe rash involving mucus membranes or skin necrosis: Unknown Has patient had a PCN reaction that required hospitalization: No Has patient had a PCN reaction occurring within the last 10 years: no (5 or 79 yrs old) If all of the above answers are "NO", then may proceed with Cephalosporin use.   . Sulfa Antibiotics Other (See Comments)    Unknown childhood reaction   Medications: aspirin atorvastatin 40 mg tablet biotin calcium  citalopram 20 mg tablet colchicine 0.6 mg tablet Concentrated Fiber Fish OiL 1,200 mg (144 mg-216 mg) capsule furosemide 80 mg tablet iron losartan 50 mg tablet metoprolol succinate ER 25 mg tablet,extended release 24 hr omeprazole 20 mg capsule,delayed release oxyCODONE-acetaminophen 5 mg-325 mg tablet pramipexole 0.5 mg  tablet Restasis 0.05 % eye drops in a dropperette temazepam 15 mg capsule turmeric Vitamin D vitamin E  Review of Systems  Constitutional: Negative.   HENT: Negative.   Eyes: Negative.   Respiratory: Negative.   Cardiovascular: Negative.   Gastrointestinal: Negative.   Endocrine: Negative.   Genitourinary: Negative.   Musculoskeletal: Positive for arthralgias and joint swelling.  Skin: Negative.   Neurological: Negative.   Psychiatric/Behavioral: Negative.     There were no vitals taken for this visit. Physical Exam Constitutional:      Appearance: Normal appearance.  HENT:     Head: Atraumatic.     Right Ear: External ear normal.     Left Ear: External ear normal.     Nose: Nose normal.     Mouth/Throat:     Pharynx: Oropharynx is clear.  Cardiovascular:     Rate and Rhythm: Normal rate and regular rhythm.     Pulses: Normal pulses.  Pulmonary:     Effort: Pulmonary effort is normal.  Abdominal:     Palpations: Abdomen is soft.  Musculoskeletal:     Cervical back: Normal range of motion.     Comments: Patient is a 79 year old female.  On exam she walks an antalgic gait. Tender medial joint line. Range is 0-1 30. Patellofemoral pain compression. No significant effusion. Please see hospital record for full history and physical.  Skin:    General: Skin is warm and dry.  Neurological:     General: No focal deficit present.     Mental Status: She is alert.    X-rays with end-stage degenerative changes right knee.  Assessment/Plan Impression: Right knee end-stage degenerative osteoarthritis  Plan: Pt with end-stage right knee DJD, bone-on-bone, refractory to conservative tx, scheduled for right total knee replacement by Dr. Tonita Cong on 02/15/2020. We again discussed the procedure itself as well as risks, complications and alternatives, including but not limited to DVT, PE, infx, bleeding, failure of procedure, need for secondary procedure including manipulation, nerve  injury, ongoing pain/symptoms, anesthesia risk, even stroke or death. Also discussed typical post-op protocols, activity restrictions, need for PT, flexion/extension exercises, time out of work. Discussed need for DVT ppx post-op per protocol. Discussed dental ppx and infx prevention. Also discussed limitations post-operatively such as kneeling and squatting. All questions were answered. Patient desires to proceed with surgery as scheduled.  Will hold supplements, ASA and NSAIDs accordingly. Will remain NPO after midnight the night before surgery. [facility] to [facility] for pre-op testing. She has also been scheduled for a preoperative IVC filter 2 days prior to surgery. Anticipate hospital stay to include at least 2 midnights given medical history and to ensure proper pain control. Plan Xarelto for DVT ppx post-op, given her history of PE and DVT. Plan oxycodone, Robaxin, Colace, Miralax. Plan home with HHPT post-op with family members at home for assistance, then transition to outpatient PT. Will follow up 10-14 days post-op for suture removal and xrays.  Plan RIGHT total knee replacement  Cecilie Kicks, PA-C for Dr. Tonita Cong 02/02/2020, 9:45 AM

## 2020-02-03 DIAGNOSIS — H18593 Other hereditary corneal dystrophies, bilateral: Secondary | ICD-10-CM | POA: Diagnosis not present

## 2020-02-03 DIAGNOSIS — M8580 Other specified disorders of bone density and structure, unspecified site: Secondary | ICD-10-CM | POA: Diagnosis not present

## 2020-02-03 DIAGNOSIS — E7800 Pure hypercholesterolemia, unspecified: Secondary | ICD-10-CM | POA: Diagnosis not present

## 2020-02-03 DIAGNOSIS — H18509 Unspecified hereditary corneal dystrophies, unspecified eye: Secondary | ICD-10-CM | POA: Diagnosis not present

## 2020-02-03 DIAGNOSIS — D508 Other iron deficiency anemias: Secondary | ICD-10-CM | POA: Diagnosis not present

## 2020-02-03 DIAGNOSIS — F329 Major depressive disorder, single episode, unspecified: Secondary | ICD-10-CM | POA: Diagnosis not present

## 2020-02-03 DIAGNOSIS — Z961 Presence of intraocular lens: Secondary | ICD-10-CM | POA: Diagnosis not present

## 2020-02-03 DIAGNOSIS — M19041 Primary osteoarthritis, right hand: Secondary | ICD-10-CM | POA: Diagnosis not present

## 2020-02-03 DIAGNOSIS — H52213 Irregular astigmatism, bilateral: Secondary | ICD-10-CM | POA: Diagnosis not present

## 2020-02-03 DIAGNOSIS — N183 Chronic kidney disease, stage 3 unspecified: Secondary | ICD-10-CM | POA: Diagnosis not present

## 2020-02-03 DIAGNOSIS — M1990 Unspecified osteoarthritis, unspecified site: Secondary | ICD-10-CM | POA: Diagnosis not present

## 2020-02-03 DIAGNOSIS — K219 Gastro-esophageal reflux disease without esophagitis: Secondary | ICD-10-CM | POA: Diagnosis not present

## 2020-02-03 DIAGNOSIS — I10 Essential (primary) hypertension: Secondary | ICD-10-CM | POA: Diagnosis not present

## 2020-02-03 DIAGNOSIS — N159 Renal tubulo-interstitial disease, unspecified: Secondary | ICD-10-CM | POA: Diagnosis not present

## 2020-02-03 DIAGNOSIS — H18523 Epithelial (juvenile) corneal dystrophy, bilateral: Secondary | ICD-10-CM | POA: Diagnosis not present

## 2020-02-03 NOTE — Patient Instructions (Addendum)
DUE TO COVID-19 ONLY ONE VISITOR IS ALLOWED TO COME WITH YOU AND STAY IN THE WAITING ROOM ONLY DURING PRE OP AND PROCEDURE DAY OF SURGERY. THE 1 VISITOR  MAY VISIT WITH YOU AFTER SURGERY IN YOUR PRIVATE ROOM DURING VISITING HOURS ONLY!  YOU NEED TO HAVE A COVID 19 TEST ON: 02/11/20 @ 10:45 AM, THIS TEST MUST BE DONE BEFORE SURGERY,  COVID TESTING SITE [place] [address], IT IS ON THE RIGHT GOING OUT [address] APPROXIMATELY  2 MINUTES PAST [employer] ON THE RIGHT. ONCE YOUR COVID TEST IS COMPLETED,  PLEASE BEGIN THE QUARANTINE INSTRUCTIONS AS OUTLINED IN YOUR HANDOUT.                [name]    Your procedure is scheduled on: 02/15/20   Report to [facility] Main  Entrance   Report to admitting at: 9:00 AM     Call this number if you have problems the morning of surgery [phone]    Remember:   NO SOLID FOOD AFTER MIDNIGHT THE NIGHT PRIOR TO SURGERY. NOTHING BY MOUTH EXCEPT CLEAR LIQUIDS UNTIL: 8:30 AM. PLEASE FINISH ENSURE DRINK PER SURGEON ORDER  WHICH NEEDS TO BE COMPLETED AT: 8:30 AM .  CLEAR LIQUID DIET   Foods Allowed                                                                     Foods Excluded  Coffee and tea, regular and decaf                             liquids that you cannot  Plain Jell-O any favor except red or purple                                           see through such as: Fruit ices (not with fruit pulp)                                     milk, soups, orange juice  Iced Popsicles                                    All solid food Carbonated beverages, regular and diet                                    Cranberry, grape and apple juices Sports drinks like Gatorade Lightly seasoned clear broth or consume(fat free) Sugar, honey syrup  Sample Menu Breakfast                                Lunch  Supper Cranberry juice                    Beef broth                             Chicken broth Jell-O                                     Grape juice                           Apple juice Coffee or tea                        Jell-O                                      Popsicle                                                Coffee or tea                        Coffee or tea  _____________________________________________________________________   BRUSH YOUR TEETH MORNING OF SURGERY AND RINSE YOUR MOUTH OUT, NO CHEWING GUM CANDY OR MINTS.     Take these medicines the morning of surgery with A SIP OF WATER: metoprolol,colchicine,prednisone.Use eye drops as usual.                               You may not have any metal on your body including hair pins and              piercings  Do not wear jewelry, make-up, lotions, powders or perfumes, deodorant             Do not wear nail polish on your fingernails.  Do not shave  48 hours prior to surgery.    Do not bring valuables to the hospital. [place].  Contacts, dentures or bridgework may not be worn into surgery.  Leave suitcase in the car. After surgery it may be brought to your room.     Patients discharged the day of surgery will not be allowed to drive home. IF YOU ARE HAVING SURGERY AND GOING HOME THE SAME DAY, YOU MUST HAVE AN ADULT TO DRIVE YOU HOME AND BE WITH YOU FOR 24 HOURS. YOU MAY GO HOME BY TAXI OR UBER OR ORTHERWISE, BUT AN ADULT MUST ACCOMPANY YOU HOME AND STAY WITH YOU FOR 24 HOURS.  Name and phone number of your driver:  Special Instructions: N/A              Please read over the following fact sheets you were given: _____________________________________________________________________          [facility] - Preparing for Surgery Before surgery, you can play an important role.  Because skin is not sterile, your skin needs to be as free of  germs as possible.  You can reduce the number of germs on your skin by washing with CHG (chlorahexidine gluconate)  soap before surgery.  CHG is an antiseptic cleaner which kills germs and bonds with the skin to continue killing germs even after washing. Please DO NOT use if you have an allergy to CHG or antibacterial soaps.  If your skin becomes reddened/irritated stop using the CHG and inform your nurse when you arrive at Short Stay. Do not shave (including legs and underarms) for at least 48 hours prior to the first CHG shower.  You may shave your face/neck. Please follow these instructions carefully:  1.  Shower with CHG Soap the night before surgery and the  morning of Surgery.  2.  If you choose to wash your hair, wash your hair first as usual with your  normal  shampoo.  3.  After you shampoo, rinse your hair and body thoroughly to remove the  shampoo.                           4.  Use CHG as you would any other liquid soap.  You can apply chg directly  to the skin and wash                       Gently with a scrungie or clean washcloth.  5.  Apply the CHG Soap to your body ONLY FROM THE NECK DOWN.   Do not use on face/ open                           Wound or open sores. Avoid contact with eyes, ears mouth and genitals (private parts).                       Wash face,  Genitals (private parts) with your normal soap.             6.  Wash thoroughly, paying special attention to the area where your surgery  will be performed.  7.  Thoroughly rinse your body with warm water from the neck down.  8.  DO NOT shower/wash with your normal soap after using and rinsing off  the CHG Soap.                9.  Pat yourself dry with a clean towel.            10.  Wear clean pajamas.            11.  Place clean sheets on your bed the night of your first shower and do not  sleep with pets. Day of Surgery : Do not apply any lotions/deodorants the morning of surgery.  Please wear clean clothes to the hospital/surgery center.  FAILURE TO FOLLOW THESE INSTRUCTIONS MAY RESULT IN THE CANCELLATION OF YOUR SURGERY PATIENT  SIGNATURE_________________________________  NURSE SIGNATURE__________________________________  ________________________________________________________________________   Adam Phenix  An incentive spirometer is a tool that can help keep your lungs clear and active. This tool measures how well you are filling your lungs with each breath. Taking long deep breaths may help reverse or decrease the chance of developing breathing (pulmonary) problems (especially infection) following:  A long period of time when you are unable to move or be active. BEFORE THE PROCEDURE   If the spirometer includes an indicator to show your best effort, your nurse  or respiratory therapist will set it to a desired goal.  If possible, sit up straight or lean slightly forward. Try not to slouch.  Hold the incentive spirometer in an upright position. INSTRUCTIONS FOR USE  1. Sit on the edge of your bed if possible, or sit up as far as you can in bed or on a chair. 2. Hold the incentive spirometer in an upright position. 3. Breathe out normally. 4. Place the mouthpiece in your mouth and seal your lips tightly around it. 5. Breathe in slowly and as deeply as possible, raising the piston or the ball toward the top of the column. 6. Hold your breath for 3-5 seconds or for as long as possible. Allow the piston or ball to fall to the bottom of the column. 7. Remove the mouthpiece from your mouth and breathe out normally. 8. Rest for a few seconds and repeat Steps 1 through 7 at least 10 times every 1-2 hours when you are awake. Take your time and take a few normal breaths between deep breaths. 9. The spirometer may include an indicator to show your best effort. Use the indicator as a goal to work toward during each repetition. 10. After each set of 10 deep breaths, practice coughing to be sure your lungs are clear. If you have an incision (the cut made at the time of surgery), support your incision when coughing  by placing a pillow or rolled up towels firmly against it. Once you are able to get out of bed, walk around indoors and cough well. You may stop using the incentive spirometer when instructed by your caregiver.  RISKS AND COMPLICATIONS  Take your time so you do not get dizzy or light-headed.  If you are in pain, you may need to take or ask for pain medication before doing incentive spirometry. It is harder to take a deep breath if you are having pain. AFTER USE  Rest and breathe slowly and easily.  It can be helpful to keep track of a log of your progress. Your caregiver can provide you with a simple table to help with this. If you are using the spirometer at home, follow these instructions: [place] IF:   You are having difficultly using the spirometer.  You have trouble using the spirometer as often as instructed.  Your pain medication is not giving enough relief while using the spirometer.  You develop fever of 100.5 F (38.1 C) or higher. SEEK IMMEDIATE MEDICAL CARE IF:   You cough up bloody sputum that had not been present before.  You develop fever of 102 F (38.9 C) or greater.  You develop worsening pain at or near the incision site. MAKE SURE YOU:   Understand these instructions.  Will watch your condition.  Will get help right away if you are not doing well or get worse. Document Released: 08/25/2006 Document Revised: 07/07/2011 Document Reviewed: 10/26/2006 [facility] Patient Information 2014 [place], [place].   ________________________________________________________________________

## 2020-02-06 ENCOUNTER — Other Ambulatory Visit: Payer: Self-pay

## 2020-02-06 ENCOUNTER — Encounter (HOSPITAL_COMMUNITY): Payer: Self-pay

## 2020-02-06 ENCOUNTER — Encounter (HOSPITAL_COMMUNITY)
Admission: RE | Admit: 2020-02-06 | Discharge: 2020-02-06 | Disposition: A | Discharge status: Home / Self Care | Payer: PPO | Source: Ambulatory Visit | Attending: Specialist | Admitting: Specialist

## 2020-02-06 DIAGNOSIS — Z01812 Encounter for preprocedural laboratory examination: Secondary | ICD-10-CM | POA: Diagnosis not present

## 2020-02-06 HISTORY — DX: Chronic kidney disease, unspecified: N18.9

## 2020-02-06 LAB — BASIC METABOLIC PANEL
Anion gap: 13 (ref 5–15)
BUN: 32 mg/dL — ABNORMAL HIGH (ref 8–23)
CO2: 31 mmol/L (ref 22–32)
Calcium: 9.7 mg/dL (ref 8.9–10.3)
Chloride: 97 mmol/L — ABNORMAL LOW (ref 98–111)
Creatinine, Ser: 1.51 mg/dL — ABNORMAL HIGH (ref 0.44–1.00)
GFR, Estimated: 33 mL/min — ABNORMAL LOW (ref >60)
Glucose, Bld: 95 mg/dL (ref 70–99)
Potassium: 4.3 mmol/L (ref 3.5–5.1)
Sodium: 141 mmol/L (ref 135–145)

## 2020-02-06 LAB — URINALYSIS, ROUTINE W REFLEX MICROSCOPIC
Bilirubin Urine: NEGATIVE
Glucose, UA: NEGATIVE mg/dL
Hgb urine dipstick: NEGATIVE
Ketones, ur: NEGATIVE mg/dL
Leukocytes,Ua: NEGATIVE
Nitrite: NEGATIVE
Protein, ur: NEGATIVE mg/dL
Specific Gravity, Urine: 1.006 (ref 1.005–1.030)
pH: 6 (ref 5.0–8.0)

## 2020-02-06 LAB — APTT: aPTT: 28 seconds (ref 24–36)

## 2020-02-06 LAB — SURGICAL PCR SCREEN
MRSA, PCR: NEGATIVE
Staphylococcus aureus: NEGATIVE

## 2020-02-06 LAB — CBC
HCT: 37.6 % (ref 36.0–46.0)
Hemoglobin: 11.8 g/dL — ABNORMAL LOW (ref 12.0–15.0)
MCH: 29.8 pg (ref 26.0–34.0)
MCHC: 31.4 g/dL (ref 30.0–36.0)
MCV: 94.9 fL (ref 80.0–100.0)
Platelets: 180 10*3/uL (ref 150–400)
RBC: 3.96 MIL/uL (ref 3.87–5.11)
RDW: 13.8 % (ref 11.5–15.5)
WBC: 6 10*3/uL (ref 4.0–10.5)
nRBC: 0 % (ref 0.0–0.2)

## 2020-02-06 LAB — PROTIME-INR
INR: 1 (ref 0.8–1.2)
Prothrombin Time: 12.9 seconds (ref 11.4–15.2)

## 2020-02-06 NOTE — Progress Notes (Signed)
COVID Vaccine Completed: Yes Date COVID Vaccine completed: 08/02/19 COVID vaccine manufacturer:    Moderna    PCP - Dr. Shirline Frees Cardiologist - Dr. Quay Burow.  Chest x-ray -  EKG - 11/10/19 Stress Test -  ECHO -  Cardiac Cath -  Pacemaker/ICD device last checked:  Sleep Study -  CPAP -   Fasting Blood Sugar -  Checks Blood Sugar _____ times a day  Blood Thinner Instructions: Aspirin Instructions: Last Dose:  Anesthesia review: Hx: HTN,PE,CHF.  Patient denies shortness of breath, fever, cough and chest pain at PAT appointment   Patient verbalized understanding of instructions that were given to them at the PAT appointment. Patient was also instructed that they will need to review over the PAT instructions again at home before surgery.

## 2020-02-08 ENCOUNTER — Other Ambulatory Visit (HOSPITAL_BASED_OUTPATIENT_CLINIC_OR_DEPARTMENT_OTHER): Payer: Self-pay | Admitting: Family Medicine

## 2020-02-08 ENCOUNTER — Other Ambulatory Visit: Payer: Self-pay

## 2020-02-08 ENCOUNTER — Ambulatory Visit (HOSPITAL_BASED_OUTPATIENT_CLINIC_OR_DEPARTMENT_OTHER)
Admission: RE | Admit: 2020-02-08 | Discharge: 2020-02-08 | Disposition: A | Discharge status: Home / Self Care | Payer: PPO | Source: Ambulatory Visit | Attending: Family Medicine | Admitting: Family Medicine

## 2020-02-08 ENCOUNTER — Encounter (HOSPITAL_COMMUNITY): Payer: Self-pay | Admitting: Physician Assistant

## 2020-02-08 DIAGNOSIS — R0602 Shortness of breath: Secondary | ICD-10-CM | POA: Diagnosis not present

## 2020-02-08 DIAGNOSIS — M7989 Other specified soft tissue disorders: Secondary | ICD-10-CM | POA: Diagnosis not present

## 2020-02-09 ENCOUNTER — Encounter (HOSPITAL_COMMUNITY): Payer: Self-pay | Admitting: Physician Assistant

## 2020-02-09 DIAGNOSIS — R509 Fever, unspecified: Secondary | ICD-10-CM | POA: Diagnosis not present

## 2020-02-09 DIAGNOSIS — Z03818 Encounter for observation for suspected exposure to other biological agents ruled out: Secondary | ICD-10-CM | POA: Diagnosis not present

## 2020-02-09 DIAGNOSIS — R52 Pain, unspecified: Secondary | ICD-10-CM | POA: Diagnosis not present

## 2020-02-09 NOTE — Anesthesia Preprocedure Evaluation (Deleted)
Anesthesia Evaluation    Airway        Dental   Pulmonary           Cardiovascular hypertension,      Neuro/Psych    GI/Hepatic   Endo/Other    Renal/GU      Musculoskeletal   Abdominal   Peds  Hematology   Anesthesia Other Findings   Reproductive/Obstetrics                             Anesthesia Physical Anesthesia Plan  ASA:   Anesthesia Plan:    Post-op Pain Management:    Induction:   PONV Risk Score and Plan:   Airway Management Planned:   Additional Equipment:   Intra-op Plan:   Post-operative Plan:   Informed Consent:   Plan Discussed with:   Anesthesia Plan Comments: (See PAT note 02/09/2020, Konrad Felix, PA-C)        Anesthesia Quick Evaluation

## 2020-02-09 NOTE — Progress Notes (Signed)
Anesthesia Chart Review   Case: [id number] Date/Time: 02/15/20 1115   Procedure: TOTAL KNEE ARTHROPLASTY (Right Knee)   Anesthesia type: Choice   Pre-op diagnosis: RIGHT KNEE DEGENERATIVE JOINT DISEASE   Location: [facility] ROOM 05 / [facility]   Surgeons: Susa Day, MD      DISCUSSION:79 y.o. never smoker with h/o HTN, GERD, CKD Stage III, CHF, right lower extremity DVT and PE 10 years ago (on Xarelto), right knee djd scheduled for above procedure 02/15/2020 with Dr. Susa Day.   Pt has been cleared by cardiology.  Last seen 01/31/2020.  Per OV note, "History of dyspnea on exertion most likely related to diastolic dysfunction.  She did have a right left heart cath performed by Dr. Claiborne Billings 07/09/2019 during hospitalization for heart failure.  She was found to have noncritical CAD with elevated LVEDP, wedge pressure and pulmonary artery pressures.  She was diuresed.  She is on oral diuretics and is aware of salt restriction.  She denies shortness of breath or weight gain. She does have a history of thrombophilia with prothrombin gene mutation.  She is had DVT in the past as well as pulmonary emboli.  She has been on Xarelto several times in the past most recently 2 years ago.  She is scheduled to have an IVC filter placed in the next week or 2 in anticipation of right total knee replacement scheduled in several weeks by Dr. Maxie Better which I have given her clearance for."  IVC filter to be placed 02/13/2020 by Dr. Servando Snare.   History of lumbar fusion with hardware in place L5-S1.   Anticipate pt can proceed with planned procedure barring acute status change.   VS: There were no vitals taken for this visit.  PROVIDERS: Shirline Frees, MD is PCP   Quay Burow, MD is Cardiologist  LABS: Labs reviewed: Acceptable for surgery. (all labs ordered are listed, but only abnormal results are displayed)  Labs Reviewed - No data to display   IMAGES:   EKG: 11/11/2019 Rate 73 bpm  NSR LAD   LBBB  CV: Cardiac Cath 07/12/2019  Prox LAD lesion is 45% stenosed.   Mild coronary obstructive disease with a smooth area of 40 to 50% proximal LAD stenosis in the region of a large first diagonal vessel.  The remainder of the coronary arteries are angiographically normal including a large ramus intermediate vessel, left circumflex vessel, and dominant RCA.  Mild right heart pressure elevation with PA systolic pressure at 37 and mean pressure 25 mmHg.  LVEDP: 25 mmHg  RECOMMENDATION: Medical therapy for mild CAD involving her proximal LAD with guideline directed medical therapy for LV dysfunction.  Myocardial Perfusion 07/08/2019  The left ventricular ejection fraction is severely decreased (<30%).  Nuclear stress EF: 29%.  There was no ST segment deviation noted during stress.  There is a small defect of mild severity present in the mid anteroseptal and apical septal location. The defect is non-reversible. This may be related to underlying LBBB. No ischemia noted.  This is a high risk study due to LV dysfunction.  Echo 06/20/2019 IMPRESSIONS    1. Mild global hypokinesis worse in the septum.. Left ventricular  ejection fraction, by estimation, is 45 to 50%. The left ventricle has  mildly decreased function. The left ventricle demonstrates global  hypokinesis. The left ventricular internal cavity  size was mildly dilated. There is mild asymmetric left ventricular  hypertrophy. Left ventricular diastolic parameters are consistent with  Grade I diastolic dysfunction (impaired relaxation). Elevated  left  ventricular end-diastolic pressure.  2. Right ventricular systolic function is normal. The right ventricular  size is normal. There is normal pulmonary artery systolic pressure.  3. Left atrial size was moderately dilated.  4. The mitral valve is normal in structure and function. Mild mitral  valve regurgitation. No evidence of mitral stenosis.  5. The aortic valve  is tricuspid. Aortic valve regurgitation is trivial.  No aortic stenosis is present.  6. The inferior vena cava is normal in size with greater than 50%  respiratory variability, suggesting right atrial pressure of 3 mmHg.  Past Medical History:  Diagnosis Date  . Anemia   . Arthritis   . Bell's palsy   . CHF (congestive heart failure) ([place])   . Chronic kidney disease    Stage III  . Dyspnea    On exertion  . GERD (gastroesophageal reflux disease)   . Hemochromatosis   . History of hiatal hernia   . Hypertension   . Iron deficiency anemia due to chronic blood loss 03/10/2017  . PE (pulmonary embolism)   . Pneumonia    "walking" pneumonia  . Prothrombin gene mutation ([place]) 05/30/2013  . Restless legs   . Right leg DVT ([place]) 05/30/2013  . Stroke [facility])    found on a MRI, she's not aware otherwise    Past Surgical History:  Procedure Laterality Date  . BACK SURGERY    . COLONOSCOPY    . RIGHT/LEFT HEART CATH AND CORONARY ANGIOGRAPHY N/A 07/12/2019   Procedure: RIGHT/LEFT HEART CATH AND CORONARY ANGIOGRAPHY;  Surgeon: Troy Sine, MD;  Location: [place] [facility];  Service: Cardiovascular;  Laterality: N/A;  . SHOULDER SURGERY Bilateral     MEDICATIONS: No current facility-administered medications for this encounter.   Marland Kitchen aspirin EC 81 MG tablet  . atorvastatin (LIPITOR) 40 MG tablet  . Biotin 5000 MCG TABS  . Calcium Carb-Cholecalciferol (CALCIUM 600+D3 PO)  . Cholecalciferol (VITAMIN D3) 50 MCG (2000 UT) TABS  . citalopram (CELEXA) 20 MG tablet  . cycloSPORINE (RESTASIS) 0.05 % ophthalmic emulsion  . ferrous sulfate 325 (65 FE) MG EC tablet  . furosemide (LASIX) 80 MG tablet  . Hypromellose (ARTIFICIAL TEARS OP)  . losartan (COZAAR) 50 MG tablet  . metoprolol succinate (TOPROL-XL) 25 MG 24 hr tablet  . Multiple Vitamin (MULTIVITAMIN WITH MINERALS) TABS tablet  . Omega-3 Fatty Acids (FISH OIL PO)  . Omega-3 Fatty Acids (FISH OIL) 1200 MG CAPS  . omeprazole  (PRILOSEC) 20 MG capsule  . polycarbophil (FIBERCON) 625 MG tablet  . pramipexole (MIRAPEX) 0.5 MG tablet  . temazepam (RESTORIL) 15 MG capsule  . Turmeric Curcumin 500 MG CAPS  . vitamin E 180 MG (400 UNITS) capsule  . colchicine 0.6 MG tablet  . predniSONE (DELTASONE) 10 MG tablet      Konrad Felix, PA-C [facility] Pre-Surgical Testing [phone]

## 2020-02-11 ENCOUNTER — Inpatient Hospital Stay (HOSPITAL_COMMUNITY): Admission: RE | Admit: 2020-02-11 | Payer: PPO | Source: Ambulatory Visit

## 2020-02-11 ENCOUNTER — Other Ambulatory Visit (HOSPITAL_COMMUNITY): Payer: PPO

## 2020-02-13 ENCOUNTER — Ambulatory Visit (HOSPITAL_COMMUNITY): Admission: RE | Admit: 2020-02-13 | Payer: PPO | Source: Home / Self Care | Admitting: Vascular Surgery

## 2020-02-13 ENCOUNTER — Encounter (HOSPITAL_COMMUNITY): Admission: RE | Payer: Self-pay | Source: Home / Self Care

## 2020-02-13 SURGERY — IVC FILTER INSERTION
Anesthesia: LOCAL

## 2020-02-13 NOTE — Progress Notes (Signed)
Patient didn't get covid test on Saturday.  Called her this am, she says her arthritis is hurting to bad, she is not coming today.  I will let office know.

## 2020-02-15 ENCOUNTER — Inpatient Hospital Stay (HOSPITAL_COMMUNITY): Admission: RE | Admit: 2020-02-15 | Payer: PPO | Source: Home / Self Care | Admitting: Specialist

## 2020-02-15 ENCOUNTER — Encounter (HOSPITAL_COMMUNITY): Admission: RE | Payer: Self-pay | Source: Home / Self Care

## 2020-02-15 SURGERY — ARTHROPLASTY, KNEE, TOTAL
Anesthesia: Choice | Site: Knee | Laterality: Right

## 2020-02-20 ENCOUNTER — Ambulatory Visit: Payer: Self-pay | Admitting: Orthopedic Surgery

## 2020-02-20 DIAGNOSIS — M10041 Idiopathic gout, right hand: Secondary | ICD-10-CM | POA: Diagnosis not present

## 2020-02-20 DIAGNOSIS — Z23 Encounter for immunization: Secondary | ICD-10-CM | POA: Diagnosis not present

## 2020-02-20 DIAGNOSIS — I10 Essential (primary) hypertension: Secondary | ICD-10-CM | POA: Diagnosis not present

## 2020-02-20 DIAGNOSIS — E7800 Pure hypercholesterolemia, unspecified: Secondary | ICD-10-CM | POA: Diagnosis not present

## 2020-02-20 DIAGNOSIS — R109 Unspecified abdominal pain: Secondary | ICD-10-CM | POA: Diagnosis not present

## 2020-02-20 DIAGNOSIS — N183 Chronic kidney disease, stage 3 unspecified: Secondary | ICD-10-CM | POA: Diagnosis not present

## 2020-02-20 DIAGNOSIS — F419 Anxiety disorder, unspecified: Secondary | ICD-10-CM | POA: Diagnosis not present

## 2020-02-29 DIAGNOSIS — M2550 Pain in unspecified joint: Secondary | ICD-10-CM | POA: Diagnosis not present

## 2020-03-01 DIAGNOSIS — I10 Essential (primary) hypertension: Secondary | ICD-10-CM | POA: Diagnosis not present

## 2020-03-01 DIAGNOSIS — N159 Renal tubulo-interstitial disease, unspecified: Secondary | ICD-10-CM | POA: Diagnosis not present

## 2020-03-01 DIAGNOSIS — M1990 Unspecified osteoarthritis, unspecified site: Secondary | ICD-10-CM | POA: Diagnosis not present

## 2020-03-01 DIAGNOSIS — E7800 Pure hypercholesterolemia, unspecified: Secondary | ICD-10-CM | POA: Diagnosis not present

## 2020-03-01 DIAGNOSIS — M8580 Other specified disorders of bone density and structure, unspecified site: Secondary | ICD-10-CM | POA: Diagnosis not present

## 2020-03-01 DIAGNOSIS — F329 Major depressive disorder, single episode, unspecified: Secondary | ICD-10-CM | POA: Diagnosis not present

## 2020-03-01 DIAGNOSIS — K219 Gastro-esophageal reflux disease without esophagitis: Secondary | ICD-10-CM | POA: Diagnosis not present

## 2020-03-01 DIAGNOSIS — M19041 Primary osteoarthritis, right hand: Secondary | ICD-10-CM | POA: Diagnosis not present

## 2020-03-01 DIAGNOSIS — G4700 Insomnia, unspecified: Secondary | ICD-10-CM | POA: Diagnosis not present

## 2020-03-01 DIAGNOSIS — D508 Other iron deficiency anemias: Secondary | ICD-10-CM | POA: Diagnosis not present

## 2020-03-01 DIAGNOSIS — N183 Chronic kidney disease, stage 3 unspecified: Secondary | ICD-10-CM | POA: Diagnosis not present

## 2020-03-05 DIAGNOSIS — Z961 Presence of intraocular lens: Secondary | ICD-10-CM | POA: Diagnosis not present

## 2020-03-05 DIAGNOSIS — H18509 Unspecified hereditary corneal dystrophies, unspecified eye: Secondary | ICD-10-CM | POA: Diagnosis not present

## 2020-03-05 DIAGNOSIS — H52213 Irregular astigmatism, bilateral: Secondary | ICD-10-CM | POA: Diagnosis not present

## 2020-03-05 DIAGNOSIS — H18523 Epithelial (juvenile) corneal dystrophy, bilateral: Secondary | ICD-10-CM | POA: Diagnosis not present

## 2020-03-08 NOTE — Patient Instructions (Addendum)
DUE TO COVID-19 ONLY ONE VISITOR IS ALLOWED TO COME WITH YOU AND STAY IN THE WAITING ROOM ONLY DURING PRE OP AND PROCEDURE DAY OF SURGERY. THE 1 VISITOR  MAY VISIT WITH YOU AFTER SURGERY IN YOUR PRIVATE ROOM DURING VISITING HOURS ONLY!  YOU NEED TO HAVE A COVID 19 TEST ON__11/16_____ @_10 :40______, THIS TEST MUST BE DONE BEFORE SURGERY,  COVID TESTING SITE [place] [address], IT IS ON THE RIGHT GOING OUT [address] APPROXIMATELY  2 MINUTES PAST [employer] ON THE RIGHT. ONCE YOUR COVID TEST IS COMPLETED,  PLEASE BEGIN THE QUARANTINE INSTRUCTIONS AS OUTLINED IN YOUR HANDOUT.                [name]    Your procedure is scheduled on: 03/16/20   Report to [facility] Main  Entrance   Report to admitting at   10:15 AM     Call this number if you have problems the morning of surgery [place], NO CHEWING GUM [place].   No food after midnight  .  You may have clear liquid until 9:30 AM.     CLEAR LIQUID DIET   Foods Allowed                                                                     Foods Excluded  Coffee and tea, regular and decaf                             liquids that you cannot  Plain Jell-O any favor except red or purple                                           see through such as: Fruit ices (not with fruit pulp)                                     milk, soups, orange juice  Iced Popsicles                                    All solid food Carbonated beverages, regular and diet                                    Cranberry, grape and apple juices Sports drinks like Gatorade Lightly seasoned clear broth or consume(fat free) Sugar, honey syrup       At 9:30 AM drink pre surgery drink  . Nothing by mouth after 9:30 AM.   Take these medicines the morning of surgery with A SIP OF WATER: Metoprolol, Prednisone,Allopurinol, eye drops  You may not have any metal on your body including hair pins and              piercings  Do not wear jewelry, make-up, lotions, powders or perfumes, deodorant             Do not wear nail polish on your fingernails.  Do not shave  48 hours prior to surgery.                Do not bring valuables to the hospital. [place].  Contacts, dentures or bridgework may not be worn into surgery.                  - Preparing for Surgery Before surgery, you can play an important role.   Because skin is not sterile, your skin needs to be as free of germs as possible .  You can reduce the number of germs on your skin by washing with CHG (chlorahexidine gluconate) soap before surgery.   CHG is an antiseptic cleaner which kills germs and bonds with the skin to continue killing germs even after washing. Please DO NOT use if you have an allergy to CHG or antibacterial soaps .  If your skin becomes reddened/irritated stop using the CHG and inform your nurse when you arrive at Short Stay. Do not shave (including legs and underarms) for at least 48 hours prior to the first CHG shower.    Please follow these instructions carefully:  1.  Shower with CHG Soap the night before surgery and the  morning of Surgery.  2.  If you choose to wash your hair, wash your hair first as usual with your  normal  shampoo.  3.  After you shampoo, rinse your hair and body thoroughly to remove the  shampoo.                                        4.  Use CHG as you would any other liquid soap.  You can apply chg directly  to the skin and wash                       Gently with a scrungie or clean washcloth.  5.  Apply the CHG Soap to your body ONLY FROM THE NECK DOWN.   Do not use on face/ open                           Wound or open sores. Avoid contact with eyes, ears mouth and genitals (private parts).                       Wash face,  Genitals  (private parts) with your normal soap.             6.  Wash thoroughly, paying special attention to the area where your surgery  will be performed.  7.  Thoroughly rinse your body with warm water from the neck down.  8.  DO NOT shower/wash with your normal soap after using and rinsing off  the CHG Soap.                9.  Pat yourself dry with  a clean towel.            10.  Wear clean pajamas.            11.  Place clean sheets on your bed the night of your first shower and do not  sleep with pets. Day of Surgery : Do not apply any lotions/deodorants the morning of surgery.  Please wear clean clothes to the hospital/surgery center.  FAILURE TO FOLLOW THESE INSTRUCTIONS MAY RESULT IN THE CANCELLATION OF YOUR SURGERY PATIENT SIGNATURE_________________________________  NURSE SIGNATURE__________________________________  ________________________________________________________________________   Adam Phenix  An incentive spirometer is a tool that can help keep your lungs clear and active. This tool measures how well you are filling your lungs with each breath. Taking long deep breaths may help reverse or decrease the chance of developing breathing (pulmonary) problems (especially infection) following:  A long period of time when you are unable to move or be active. BEFORE THE PROCEDURE   If the spirometer includes an indicator to show your best effort, your nurse or respiratory therapist will set it to a desired goal.  If possible, sit up straight or lean slightly forward. Try not to slouch.  Hold the incentive spirometer in an upright position. INSTRUCTIONS FOR USE  1. Sit on the edge of your bed if possible, or sit up as far as you can in bed or on a chair. 2. Hold the incentive spirometer in an upright position. 3. Breathe out normally. 4. Place the mouthpiece in your mouth and seal your lips tightly around it. 5. Breathe in slowly and as deeply as possible, raising the  piston or the ball toward the top of the column. 6. Hold your breath for 3-5 seconds or for as long as possible. Allow the piston or ball to fall to the bottom of the column. 7. Remove the mouthpiece from your mouth and breathe out normally. 8. Rest for a few seconds and repeat Steps 1 through 7 at least 10 times every 1-2 hours when you are awake. Take your time and take a few normal breaths between deep breaths. 9. The spirometer may include an indicator to show your best effort. Use the indicator as a goal to work toward during each repetition. 10. After each set of 10 deep breaths, practice coughing to be sure your lungs are clear. If you have an incision (the cut made at the time of surgery), support your incision when coughing by placing a pillow or rolled up towels firmly against it. Once you are able to get out of bed, walk around indoors and cough well. You may stop using the incentive spirometer when instructed by your caregiver.  RISKS AND COMPLICATIONS  Take your time so you do not get dizzy or light-headed.  If you are in pain, you may need to take or ask for pain medication before doing incentive spirometry. It is harder to take a deep breath if you are having pain. AFTER USE  Rest and breathe slowly and easily.  It can be helpful to keep track of a log of your progress. Your caregiver can provide you with a simple table to help with this. If you are using the spirometer at home, follow these instructions: [place] IF:   You are having difficultly using the spirometer.  You have trouble using the spirometer as often as instructed.  Your pain medication is not giving enough relief while using the spirometer.  You develop fever of 100.5 F (38.1 C)  or higher. SEEK IMMEDIATE MEDICAL CARE IF:   You cough up bloody sputum that had not been present before.  You develop fever of 102 F (38.9 C) or greater.  You develop worsening pain at or near the incision  site. MAKE SURE YOU:   Understand these instructions.  Will watch your condition.  Will get help right away if you are not doing well or get worse. Document Released: 08/25/2006 Document Revised: 07/07/2011 Document Reviewed: 10/26/2006 [facility] Patient Information 2014 [place], [place].   ________________________________________________________________________

## 2020-03-09 ENCOUNTER — Encounter (HOSPITAL_COMMUNITY)
Admission: RE | Admit: 2020-03-09 | Discharge: 2020-03-09 | Disposition: A | Discharge status: Home / Self Care | Payer: PPO | Source: Ambulatory Visit | Attending: Specialist | Admitting: Specialist

## 2020-03-09 ENCOUNTER — Other Ambulatory Visit: Payer: Self-pay

## 2020-03-09 ENCOUNTER — Ambulatory Visit: Payer: Self-pay | Admitting: Orthopedic Surgery

## 2020-03-09 ENCOUNTER — Encounter (HOSPITAL_COMMUNITY): Payer: Self-pay

## 2020-03-09 DIAGNOSIS — Z01812 Encounter for preprocedural laboratory examination: Secondary | ICD-10-CM | POA: Insufficient documentation

## 2020-03-09 HISTORY — DX: Peripheral vascular disease, unspecified: I73.9

## 2020-03-09 LAB — SURGICAL PCR SCREEN
MRSA, PCR: NEGATIVE
Staphylococcus aureus: NEGATIVE

## 2020-03-09 LAB — PROTIME-INR
INR: 1.1 (ref 0.8–1.2)
Prothrombin Time: 13.3 seconds (ref 11.4–15.2)

## 2020-03-09 LAB — BASIC METABOLIC PANEL
Anion gap: 11 (ref 5–15)
BUN: 43 mg/dL — ABNORMAL HIGH (ref 8–23)
CO2: 28 mmol/L (ref 22–32)
Calcium: 9.4 mg/dL (ref 8.9–10.3)
Chloride: 99 mmol/L (ref 98–111)
Creatinine, Ser: 1.44 mg/dL — ABNORMAL HIGH (ref 0.44–1.00)
GFR, Estimated: 37 mL/min — ABNORMAL LOW (ref >60)
Glucose, Bld: 92 mg/dL (ref 70–99)
Potassium: 4.2 mmol/L (ref 3.5–5.1)
Sodium: 138 mmol/L (ref 135–145)

## 2020-03-09 LAB — CBC
HCT: 36 % (ref 36.0–46.0)
Hemoglobin: 11.4 g/dL — ABNORMAL LOW (ref 12.0–15.0)
MCH: 29.4 pg (ref 26.0–34.0)
MCHC: 31.7 g/dL (ref 30.0–36.0)
MCV: 92.8 fL (ref 80.0–100.0)
Platelets: 163 10*3/uL (ref 150–400)
RBC: 3.88 MIL/uL (ref 3.87–5.11)
RDW: 14.5 % (ref 11.5–15.5)
WBC: 6.2 10*3/uL (ref 4.0–10.5)
nRBC: 0 % (ref 0.0–0.2)

## 2020-03-09 LAB — URINALYSIS, ROUTINE W REFLEX MICROSCOPIC
Bilirubin Urine: NEGATIVE
Glucose, UA: NEGATIVE mg/dL
Hgb urine dipstick: NEGATIVE
Ketones, ur: NEGATIVE mg/dL
Leukocytes,Ua: NEGATIVE
Nitrite: NEGATIVE
Protein, ur: NEGATIVE mg/dL
Specific Gravity, Urine: 1.01 (ref 1.005–1.030)
pH: 5 (ref 5.0–8.0)

## 2020-03-09 LAB — APTT: aPTT: 33 seconds (ref 24–36)

## 2020-03-09 NOTE — Progress Notes (Signed)
COVID Vaccine Completed:Yes Date COVID Vaccine completed:08/02/19 COVID vaccine manufacturer: [place]     PCP - Dr. Kenton Kingfisher Cardiologist -Dr/ Gwenlyn Found   Chest x-ray - no EKG - 11/10/19-Epic Stress Test - 07/08/19- Dr. Kennon Holter office ECHO - 06/20/19-Dr. Berry's office Cardiac Cath - 07/12/19- Epic Pacemaker/ICD device last checked:  Sleep Study - no CPAP -   Fasting Blood Sugar - NA Checks Blood Sugar _____ times a day  Blood Thinner Instructions:ASA/ [place] Aspirin Instructions: stop 5 days prior/ [place] Last Dose:03/09/20  Anesthesia review:   Patient denies shortness of breath, fever, cough and chest pain at PAT appointment yes   Patient verbalized understanding of instructions that were given to them at the PAT appointment. Patient was also instructed that they will need to review over the PAT instructions again at home before surgery. Yes  Pt has Hemochromatosis. She has had a PE and DVT. She will get a IVC filter on 03/12/20 She had SOB in 06/2019 for CHF but none now. None with doing housework or with ADLs.  She has foot drop on the left from a back surgery and uses a walker

## 2020-03-10 ENCOUNTER — Other Ambulatory Visit (HOSPITAL_COMMUNITY)
Admission: RE | Admit: 2020-03-10 | Discharge: 2020-03-10 | Disposition: A | Discharge status: Home / Self Care | Payer: PPO | Source: Ambulatory Visit | Attending: Vascular Surgery | Admitting: Vascular Surgery

## 2020-03-10 DIAGNOSIS — Z01812 Encounter for preprocedural laboratory examination: Secondary | ICD-10-CM | POA: Diagnosis not present

## 2020-03-10 DIAGNOSIS — Z20822 Contact with and (suspected) exposure to covid-19: Secondary | ICD-10-CM | POA: Diagnosis not present

## 2020-03-10 LAB — SARS CORONAVIRUS 2 (TAT 6-24 HRS): SARS Coronavirus 2: NEGATIVE

## 2020-03-12 ENCOUNTER — Encounter (HOSPITAL_COMMUNITY): Payer: Self-pay | Admitting: Vascular Surgery

## 2020-03-12 ENCOUNTER — Encounter (HOSPITAL_COMMUNITY)
Admission: RE | Disposition: A | Discharge status: Home / Self Care | Payer: Self-pay | Source: Home / Self Care | Attending: Vascular Surgery

## 2020-03-12 ENCOUNTER — Other Ambulatory Visit: Payer: Self-pay

## 2020-03-12 ENCOUNTER — Ambulatory Visit (HOSPITAL_COMMUNITY)
Admission: RE | Admit: 2020-03-12 | Discharge: 2020-03-12 | Disposition: A | Discharge status: Home / Self Care | Payer: PPO | Attending: Vascular Surgery | Admitting: Vascular Surgery

## 2020-03-12 DIAGNOSIS — Z86718 Personal history of other venous thrombosis and embolism: Secondary | ICD-10-CM | POA: Insufficient documentation

## 2020-03-12 DIAGNOSIS — M25561 Pain in right knee: Secondary | ICD-10-CM | POA: Diagnosis not present

## 2020-03-12 DIAGNOSIS — Z79899 Other long term (current) drug therapy: Secondary | ICD-10-CM | POA: Diagnosis not present

## 2020-03-12 DIAGNOSIS — Z7982 Long term (current) use of aspirin: Secondary | ICD-10-CM | POA: Diagnosis not present

## 2020-03-12 DIAGNOSIS — Z885 Allergy status to narcotic agent status: Secondary | ICD-10-CM | POA: Diagnosis not present

## 2020-03-12 DIAGNOSIS — Z882 Allergy status to sulfonamides status: Secondary | ICD-10-CM | POA: Diagnosis not present

## 2020-03-12 DIAGNOSIS — Z880 Allergy status to penicillin: Secondary | ICD-10-CM | POA: Diagnosis not present

## 2020-03-12 HISTORY — PX: IVC FILTER INSERTION: CATH118245

## 2020-03-12 LAB — POCT I-STAT, CHEM 8
BUN: 41 mg/dL — ABNORMAL HIGH (ref 8–23)
Calcium, Ion: 1.22 mmol/L (ref 1.15–1.40)
Chloride: 102 mmol/L (ref 98–111)
Creatinine, Ser: 1.3 mg/dL — ABNORMAL HIGH (ref 0.44–1.00)
Glucose, Bld: 87 mg/dL (ref 70–99)
HCT: 33 % — ABNORMAL LOW (ref 36.0–46.0)
Hemoglobin: 11.2 g/dL — ABNORMAL LOW (ref 12.0–15.0)
Potassium: 3.5 mmol/L (ref 3.5–5.1)
Sodium: 142 mmol/L (ref 135–145)
TCO2: 29 mmol/L (ref 22–32)

## 2020-03-12 SURGERY — IVC FILTER INSERTION
Anesthesia: LOCAL

## 2020-03-12 MED ORDER — SODIUM CHLORIDE 0.9% FLUSH: 3.0000 mL | Freq: Two times a day (BID) | INTRAVENOUS | Status: DC

## 2020-03-12 MED ORDER — LABETALOL HCL 5 MG/ML IV SOLN: 10.0000 mg | INTRAVENOUS | Status: DC | PRN

## 2020-03-12 MED ORDER — LIDOCAINE HCL (PF) 1 % IJ SOLN
INTRAMUSCULAR | Status: AC
  Filled 2020-03-12: qty 30

## 2020-03-12 MED ORDER — MIDAZOLAM HCL 2 MG/2ML IJ SOLN
INTRAMUSCULAR | Status: AC
  Filled 2020-03-12: qty 2

## 2020-03-12 MED ORDER — FENTANYL CITRATE (PF) 100 MCG/2ML IJ SOLN
INTRAMUSCULAR | Status: DC | PRN
  Administered 2020-03-12: 25 ug via INTRAVENOUS

## 2020-03-12 MED ORDER — HEPARIN (PORCINE) IN NACL 1000-0.9 UT/500ML-% IV SOLN
INTRAVENOUS | Status: DC | PRN
  Administered 2020-03-12: 500 mL

## 2020-03-12 MED ORDER — ACETAMINOPHEN 325 MG PO TABS: 650.0000 mg | ORAL_TABLET | ORAL | Status: DC | PRN

## 2020-03-12 MED ORDER — SODIUM CHLORIDE 0.9 % IV SOLN: INTRAVENOUS | Status: DC

## 2020-03-12 MED ORDER — ONDANSETRON HCL 4 MG/2ML IJ SOLN: 4.0000 mg | Freq: Four times a day (QID) | INTRAMUSCULAR | Status: DC | PRN

## 2020-03-12 MED ORDER — SODIUM CHLORIDE 0.9% FLUSH: 3.0000 mL | INTRAVENOUS | Status: DC | PRN

## 2020-03-12 MED ORDER — HYDRALAZINE HCL 20 MG/ML IJ SOLN: 5.0000 mg | INTRAMUSCULAR | Status: DC | PRN

## 2020-03-12 MED ORDER — IODIXANOL 320 MG/ML IV SOLN
INTRAVENOUS | Status: DC | PRN
  Administered 2020-03-12: 10 mL via INTRAVENOUS

## 2020-03-12 MED ORDER — FENTANYL CITRATE (PF) 100 MCG/2ML IJ SOLN
INTRAMUSCULAR | Status: AC
  Filled 2020-03-12: qty 2

## 2020-03-12 MED ORDER — LIDOCAINE HCL (PF) 1 % IJ SOLN
INTRAMUSCULAR | Status: DC | PRN
  Administered 2020-03-12: 15 mL via INTRADERMAL

## 2020-03-12 MED ORDER — SODIUM CHLORIDE 0.9 % IV SOLN: 250.0000 mL | INTRAVENOUS | Status: DC | PRN

## 2020-03-12 MED ORDER — HEPARIN (PORCINE) IN NACL 1000-0.9 UT/500ML-% IV SOLN
INTRAVENOUS | Status: AC
  Filled 2020-03-12: qty 500

## 2020-03-12 MED ORDER — MIDAZOLAM HCL 2 MG/2ML IJ SOLN
INTRAMUSCULAR | Status: DC | PRN
  Administered 2020-03-12: 1 mg via INTRAVENOUS

## 2020-03-12 SURGICAL SUPPLY — 10 items
FILTER VC CELECT-FEMORAL (Filter) ×2 IMPLANT
KIT MICROPUNCTURE NIT STIFF (SHEATH) ×2 IMPLANT
KIT PV (KITS) ×2 IMPLANT
PROTECTION STATION PRESSURIZED (MISCELLANEOUS) ×2
SHEATH PROBE COVER 6X72 (BAG) ×2 IMPLANT
STATION PROTECTION PRESSURIZED (MISCELLANEOUS) ×1 IMPLANT
TRANSDUCER W/STOPCOCK (MISCELLANEOUS) ×2 IMPLANT
TRAY PV CATH (CUSTOM PROCEDURE TRAY) ×2 IMPLANT
TUBING INJECTOR 48 (MISCELLANEOUS) ×2 IMPLANT
WIRE BENTSON .035X145CM (WIRE) ×2 IMPLANT

## 2020-03-12 NOTE — Op Note (Signed)
    Patient name: [name] MRN: [id number] DOB: 10/09/1940 Sex: female  03/12/2020 Pre-operative Diagnosis: History of DVT, need for right total knee arthroplasty Post-operative diagnosis:  Same Surgeon:  Eda Paschal. Donzetta Matters, MD Procedure Performed: 1.  Ultrasound-guided cannulation right common femoral vein 2. central venogram 3.  Placement of Cook Celect IVC filter in the infrarenal IVC 4.  Moderate sedation with fentanyl and Versed for 9 minutes  Indications: 79 year old female with a history of hypercoagulable disorder.  She is now indicated for a total knee arthroplasty and we are placing a filter which should be removed in 3 months.  Findings: Patient has a very tortuous infrarenal IVC.  Bilateral renal veins were identified filter was placed just below unfortunately is tilted due to the tortuosity of her IVC.  Plan will be to remove filter in 3 months   Procedure:  The patient was identified in the holding area and taken to room 8.  The patient was then placed supine on the table and prepped and draped in the usual sterile fashion.  Under sedation with fentanyl and Versed was administered and a nurse monitored her vital signs throughout the course of the operation.  A time out was called.  Ultrasound was used to evaluate the right common femoral vein this was patent and compressible.  The area was anesthetized 1% lidocaine cannulated with micropuncture needle followed the wire sheath.  And images saved the permanent record.  Bentson wire was placed into the suprarenal IVC.  Introducer sheath was placed.  Venogram was performed.  Filter was deployed in the infrarenal position.  She tolerated procedure well without any complication.  Sheath was removed and pressure held until hemostasis obtained.   Contrast: 10cc  Doralene Glanz C. Donzetta Matters, MD [facility] Office: [phone] Pager: [phone]

## 2020-03-12 NOTE — H&P (Signed)
HP   History of Present Illness: This is a 79 y.o. female with history of dvt. She now needs R TKA and plan for ivc filter.   Past Medical History:  Diagnosis Date  . Anemia   . Arthritis    osteoarthritis Shoulder,neck  . Bell's palsy 1980s  . CHF (congestive heart failure) ([place]) 06/2019  . Chronic kidney disease    Stage III  . Dyspnea 06/2019   On exertion  . GERD (gastroesophageal reflux disease)   . Hemochromatosis   . History of hiatal hernia   . Hypertension   . Iron deficiency anemia due to chronic blood loss 03/10/2017  . PE (pulmonary embolism) 2007   x2  . Peripheral vascular disease (HCC)    DVT Rt leg  . Pneumonia 1990s   "walking" pneumonia  . Prothrombin gene mutation ([place]) 05/30/2013  . Restless legs   . Right leg DVT ([place]) 05/30/2013  . Stroke [facility])    found on a MRI, she's not aware otherwise    Past Surgical History:  Procedure Laterality Date  . BACK SURGERY    . CARDIAC CATHETERIZATION Bilateral 2005  . COLONOSCOPY    . RIGHT/LEFT HEART CATH AND CORONARY ANGIOGRAPHY N/A 07/12/2019   Procedure: RIGHT/LEFT HEART CATH AND CORONARY ANGIOGRAPHY;  Surgeon: Troy Sine, MD;  Location: [facility];  Service: Cardiovascular;  Laterality: N/A;  . SHOULDER SURGERY Bilateral     Allergies  Allergen Reactions  . Morphine And Related Other (See Comments)    LARGER DOSES OF MORPHINE CAUSES BODY TWITCHING  . Penicillins Anaphylaxis    Childhood reaction Has patient had a PCN reaction causing immediate rash, facial/tongue/throat swelling, SOB or lightheadedness with hypotension: Yes Has patient had a PCN reaction causing severe rash involving mucus membranes or skin necrosis: Unknown Has patient had a PCN reaction that required hospitalization: No Has patient had a PCN reaction occurring within the last 10 years: no (5 or 79 yrs old) If all of the above answers are "NO", then may proceed with Cephalosporin use.   . Sulfa Antibiotics Other (See  Comments)    Unknown childhood reaction    Prior to Admission medications   Medication Sig Start Date End Date Taking? Authorizing Provider  allopurinol (ZYLOPRIM) 100 MG tablet Take 100 mg by mouth daily. 03/02/20  Yes [provider]  aspirin EC 81 MG tablet Take 162 mg by mouth daily. Swallow whole.   Yes [provider]  atorvastatin (LIPITOR) 40 MG tablet Take 1 tablet (40 mg total) by mouth daily at 6 PM. 07/13/19  Yes Darrick Meigs, Marge Duncans, MD  Biotin 5000 MCG TABS Take 5,000 mcg by mouth daily.    Yes [provider]  Calcium Carb-Cholecalciferol (CALCIUM 600+D3 PO) Take 1 tablet by mouth in the morning and at bedtime.   Yes [provider]  Cholecalciferol (VITAMIN D3) 50 MCG (2000 UT) TABS Take 2,000 Units by mouth in the morning and at bedtime.   Yes [provider]  citalopram (CELEXA) 20 MG tablet Take 20 mg by mouth at bedtime.  08/05/11  Yes [provider]  colchicine 0.6 MG tablet Take 0.6 mg by mouth every other day.  01/20/20  Yes [provider]  cycloSPORINE (RESTASIS) 0.05 % ophthalmic emulsion Place 1 drop into both eyes 2 (two) times daily.   Yes [provider]  ferrous sulfate 325 (65 FE) MG EC tablet Take 325 mg by mouth daily.   Yes [provider]  furosemide (LASIX) 80 MG tablet Take 1 tablet (80 mg total) by mouth daily. 07/13/19 07/12/20 Yes Lama, Marge Duncans, MD  Hypromellose (ARTIFICIAL TEARS OP) Place 1 drop into both eyes 3 (three) times daily as needed (dry eyes).   Yes [provider]  losartan (COZAAR) 50 MG tablet Take 1 tablet (50 mg total) by mouth daily. 07/13/19  Yes Oswald Hillock, MD  metoprolol succinate (TOPROL-XL) 25 MG 24 hr tablet Take 1 tablet (25 mg total) by mouth daily. 11/16/19  Yes Lorretta Harp, MD  Multiple Vitamin (MULTIVITAMIN WITH MINERALS) TABS tablet Take 1 tablet by mouth daily. Centrum Silver (NO IRON)   Yes [provider]  Omega-3 Fatty Acids (FISH  OIL) 1200 MG CAPS Take 1,200 mg by mouth daily.   Yes [provider]  omeprazole (PRILOSEC) 20 MG capsule Take 20 mg by mouth daily before breakfast.  07/04/19  Yes [provider]  polycarbophil (FIBERCON) 625 MG tablet Take 625 mg by mouth in the morning and at bedtime.   Yes [provider]  pramipexole (MIRAPEX) 0.5 MG tablet Take 0.5 mg by mouth daily in the afternoon.   Yes [provider]  temazepam (RESTORIL) 15 MG capsule Take 1 capsule (15 mg total) by mouth at bedtime. 12/02/16  Yes Love, Ivan Anchors, PA-C  Turmeric Curcumin 500 MG CAPS Take 500 mg by mouth at bedtime.    Yes [provider]  vitamin E 180 MG (400 UNITS) capsule Take 400 Units by mouth at bedtime.   Yes [provider]  Omega-3 Fatty Acids (FISH OIL PO) Take 2,000 mg by mouth daily. Patient not taking: Reported on 03/05/2020    [provider]  predniSONE (DELTASONE) 10 MG tablet Take by mouth. Patient not taking: Reported on 03/05/2020 12/14/19   [provider]    Social History   Socioeconomic History  . Marital status: Married    Spouse name: Not on file  . Number of children: Not on file  . Years of education: Not on file  . Highest education level: Not on file  Occupational History  . Not on file  Tobacco Use  . Smoking status: Never Smoker  . Smokeless tobacco: Never Used  . Tobacco comment: never used tobacco  Vaping Use  . Vaping Use: Never used  Substance and Sexual Activity  . Alcohol use: No    Alcohol/week: 0.0 standard drinks  . Drug use: No  . Sexual activity: Not on file  Other Topics Concern  . Not on file  Social History Narrative  . Not on file   Social Determinants of Health   Financial Resource Strain:   . Difficulty of Paying Living Expenses: Not on file  Food Insecurity:   . Worried About Charity fundraiser in the Last Year: Not on file  . Ran Out of Food in the Last Year: Not on file  Transportation Needs:    . Lack of Transportation (Medical): Not on file  . Lack of Transportation (Non-Medical): Not on file  Physical Activity:   . Days of Exercise per Week: Not on file  . Minutes of Exercise per Session: Not on file  Stress:   . Feeling of Stress : Not on file  Social Connections:   . Frequency of Communication with Friends and Family: Not on file  . Frequency of Social Gatherings with Friends and Family: Not on file  . Attends Religious Services: Not on file  . Active Member  of Clubs or Organizations: Not on file  . Attends Archivist Meetings: Not on file  . Marital Status: Not on file  Intimate Partner Violence:   . Fear of Current or Ex-Partner: Not on file  . Emotionally Abused: Not on file  . Physically Abused: Not on file  . Sexually Abused: Not on file     Family History  Problem Relation Age of Onset  . Congestive Heart Failure Mother   . Pancreatic cancer Father     ROS Cardiovascular: []  chest pain/pressure []  palpitations []  SOB lying flat []  DOE []  pain in legs while walking []  pain in legs at rest []  pain in legs at night []  non-healing ulcers []  hx of DVT []  swelling in legs  Pulmonary: []  productive cough []  asthma/wheezing []  home O2  Neurologic: []  weakness in []  arms []  legs []  numbness in []  arms []  legs []  hx of CVA []  mini stroke [] difficulty speaking or slurred speech []  temporary loss of vision in one eye []  dizziness  Hematologic: []  hx of cancer []  bleeding problems []  problems with blood clotting easily  Endocrine:   []  diabetes []  thyroid disease  GI []  vomiting blood []  blood in stool  GU: []  CKD/renal failure []  HD--[]  M/W/F or []  T/T/S []  burning with urination []  blood in urine  Psychiatric: []  anxiety []  depression  Musculoskeletal: []  arthritis []  joint pain  Integumentary: []  rashes []  ulcers  Constitutional: []  fever []  chills   Physical Examination  Vitals:   03/12/20 0657  BP: (!)  146/64  Pulse: 67  Temp: 97.9 F (36.6 C)  SpO2: 98%   Body mass index is 30.11 kg/m.  General:  nad HENT: WNL, normocephalic Pulmonary: normal non-labored breathing Cardiac: palpable pulses Abdomen:  soft, NT/ND, no masses Musculoskeletal: no muscle wasting or atrophy  Neurologic: A&O X 3; Appropriate Affect ; SENSATION: normal; MOTOR FUNCTION:  moving all extremities equally. Speech is fluent/normal   CBC    Component Value Date/Time   WBC 6.2 03/09/2020 0854   RBC 3.88 03/09/2020 0854   HGB 11.4 (L) 03/09/2020 0854   HGB 11.3 (L) 11/17/2019 1048   HGB 11.7 04/13/2017 1134   HGB 12.2 06/14/2007 0918   HCT 36.0 03/09/2020 0854   HCT 37.0 04/13/2017 1134   HCT 37.6 06/14/2007 0918   PLT 163 03/09/2020 0854   PLT 233 11/17/2019 1048   PLT 255 04/13/2017 1134   PLT 171 06/14/2007 0918   MCV 92.8 03/09/2020 0854   MCV 84 04/13/2017 1134   MCV 89.8 06/14/2007 0918   MCH 29.4 03/09/2020 0854   MCHC 31.7 03/09/2020 0854   RDW 14.5 03/09/2020 0854   RDW 18.7 (H) 04/13/2017 1134   RDW 13.8 06/14/2007 0918   LYMPHSABS 1.1 11/17/2019 1048   LYMPHSABS 1.4 04/13/2017 1134   LYMPHSABS 1.4 06/14/2007 0918   MONOABS 0.8 11/17/2019 1048   MONOABS 0.5 06/14/2007 0918   EOSABS 0.1 11/17/2019 1048   EOSABS 0.3 04/13/2017 1134   BASOSABS 0.0 11/17/2019 1048   BASOSABS 0.0 04/13/2017 1134   BASOSABS 0.0 06/14/2007 0918    BMET    Component Value Date/Time   NA 138 03/09/2020 0854   NA 140 11/15/2019 1023   NA 144 04/13/2017 1134   NA 142 07/28/2016 1144   K 4.2 03/09/2020 0854   K 3.3 04/13/2017 1134   K 3.6 07/28/2016 1144   CL 99 03/09/2020 0854   CL 97 (L) 04/13/2017 1134  CO2 28 03/09/2020 0854   CO2 32 04/13/2017 1134   CO2 33 (H) 07/28/2016 1144   GLUCOSE 92 03/09/2020 0854   GLUCOSE 105 04/13/2017 1134   BUN 43 (H) 03/09/2020 0854   BUN 31 (H) 11/15/2019 1023   BUN 18 04/13/2017 1134   BUN 22.6 07/28/2016 1144   CREATININE 1.44 (H) 03/09/2020 0854    CREATININE 1.04 (H) 05/09/2019 1456   CREATININE 1.0 04/13/2017 1134   CREATININE 1.0 07/28/2016 1144   CALCIUM 9.4 03/09/2020 0854   CALCIUM 10.1 04/13/2017 1134   CALCIUM 9.5 07/28/2016 1144   GFRNONAA 37 (L) 03/09/2020 0854   GFRNONAA 51 (L) 05/09/2019 1456   GFRAA 46 (L) 11/17/2019 1048   GFRAA 59 (L) 05/09/2019 1456    COAGS: Lab Results  Component Value Date   INR 1.1 03/09/2020   INR 1.0 02/06/2020   INR 1.4 (L) 01/18/2009   PROTIME 16.8 (H) 01/18/2009   PROTIME 32.4 (H) 12/07/2008   PROTIME 24.0 (H) 06/15/2008     ASSESSMENT/PLAN: This is a 79 y.o. female with history of multiple dvt now maintained on aspirin. Plan for ivc filter and will remove in 3 months.   Dantavious Snowball C. Donzetta Matters, MD [facility] Office: [phone] Pager: [phone]

## 2020-03-12 NOTE — Discharge Instructions (Signed)
Inferior Vena Cava Filter Insertion, Care After This sheet gives you information about how to care for yourself after your procedure. Your health care provider may also give you more specific instructions. If you have problems or questions, contact your health care provider. What can I expect after the procedure? After your procedure, it is common to have:  Mild pain in the area where the filter was inserted.  Mild bruising in the area where the filter was inserted. Follow these instructions at home: Insertion site care   Follow instructions from your health care provider about how to take care of the site where a catheter was inserted at your neck or groin (insertion site). Make sure you: ? Wash your hands with soap and water before you change your bandage (dressing). If soap and water are not available, use hand sanitizer. ? Change your dressing as told by your health care provider.  Check your insertion site every day for signs of infection. Check for: ? More redness, swelling, or pain. ? More fluid or blood. ? Warmth. ? Pus or a bad smell.  Keep the insertion site clean and dry.  Do not shower, bathe, use a hot tub, or let the dressing get wet until your health care provider approves. General instructions  Take over-the-counter and prescription medicines only as told by your health care provider.  Avoid heavy lifting or hard activities for 48 hours after the procedure or as told by your health care provider.  Do not drive for 24 hours if you were given a medicine to help you relax (sedative).  Do not drive or use heavy machinery while taking prescription pain medicine.  Do not go back to school or work until your health care provider approves.  Keep all follow-up visits as told by your health care provider. This is important. Contact a health care provider if:  You have more redness, swelling, or pain around your insertion site.  You have more fluid or blood coming from  your insertion site.  Your insertion site feels warm to the touch.  You have pus or a bad smell coming from your insertion site.  You have a fever.  You are dizzy.  You have nausea and vomiting.  You develop a rash. Get help right away if:  You develop chest pain, a cough, or difficulty breathing.  You develop shortness of breath, feel faint, or pass out.  You cough up blood.  You have severe pain in your abdomen.  You develop swelling and discoloration or pain in your legs.  Your legs become pale and cold or blue.  You develop weakness, difficulty moving your arms or legs, or balance problems.  You develop problems with speech or vision. These symptoms may represent a serious problem that is an emergency. Do not wait to see if the symptoms will go away. Get medical help right away. Call your local emergency services (911 in the [place] Do not drive yourself to the hospital. Summary  After your insertion procedure, it is common to have mild pain and bruising.  Do not shower, bathe, use a hot tub, or let the dressing get wet until your health care provider approves.  Every day, check for signs of infection where a catheter was inserted at your neck or groin (insertion site). This information is not intended to replace advice given to you by your health care provider. Make sure you discuss any questions you have with your health care provider. Document Revised: 03/27/2017 Document Reviewed:   03/05/2016 Elsevier Patient Education  2020 Elsevier Inc.  

## 2020-03-13 ENCOUNTER — Other Ambulatory Visit (HOSPITAL_COMMUNITY)
Admission: RE | Admit: 2020-03-13 | Discharge: 2020-03-13 | Disposition: A | Discharge status: Home / Self Care | Payer: PPO | Source: Ambulatory Visit | Attending: Vascular Surgery | Admitting: Vascular Surgery

## 2020-03-13 DIAGNOSIS — Z86718 Personal history of other venous thrombosis and embolism: Secondary | ICD-10-CM | POA: Diagnosis not present

## 2020-03-13 DIAGNOSIS — G510 Bell's palsy: Secondary | ICD-10-CM | POA: Diagnosis present

## 2020-03-13 DIAGNOSIS — K219 Gastro-esophageal reflux disease without esophagitis: Secondary | ICD-10-CM | POA: Diagnosis present

## 2020-03-13 DIAGNOSIS — I252 Old myocardial infarction: Secondary | ICD-10-CM | POA: Diagnosis not present

## 2020-03-13 DIAGNOSIS — N183 Chronic kidney disease, stage 3 unspecified: Secondary | ICD-10-CM | POA: Diagnosis not present

## 2020-03-13 DIAGNOSIS — G8918 Other acute postprocedural pain: Secondary | ICD-10-CM | POA: Diagnosis not present

## 2020-03-13 DIAGNOSIS — M19019 Primary osteoarthritis, unspecified shoulder: Secondary | ICD-10-CM | POA: Diagnosis present

## 2020-03-13 DIAGNOSIS — M109 Gout, unspecified: Secondary | ICD-10-CM | POA: Diagnosis present

## 2020-03-13 DIAGNOSIS — Z800 Family history of malignant neoplasm of digestive organs: Secondary | ICD-10-CM | POA: Diagnosis not present

## 2020-03-13 DIAGNOSIS — Z96651 Presence of right artificial knee joint: Secondary | ICD-10-CM | POA: Diagnosis not present

## 2020-03-13 DIAGNOSIS — Z8249 Family history of ischemic heart disease and other diseases of the circulatory system: Secondary | ICD-10-CM | POA: Diagnosis not present

## 2020-03-13 DIAGNOSIS — Z8673 Personal history of transient ischemic attack (TIA), and cerebral infarction without residual deficits: Secondary | ICD-10-CM | POA: Diagnosis not present

## 2020-03-13 DIAGNOSIS — Z7901 Long term (current) use of anticoagulants: Secondary | ICD-10-CM | POA: Diagnosis not present

## 2020-03-13 DIAGNOSIS — Z882 Allergy status to sulfonamides status: Secondary | ICD-10-CM | POA: Diagnosis not present

## 2020-03-13 DIAGNOSIS — I509 Heart failure, unspecified: Secondary | ICD-10-CM | POA: Diagnosis not present

## 2020-03-13 DIAGNOSIS — J449 Chronic obstructive pulmonary disease, unspecified: Secondary | ICD-10-CM | POA: Diagnosis present

## 2020-03-13 DIAGNOSIS — Z86711 Personal history of pulmonary embolism: Secondary | ICD-10-CM | POA: Diagnosis not present

## 2020-03-13 DIAGNOSIS — I2510 Atherosclerotic heart disease of native coronary artery without angina pectoris: Secondary | ICD-10-CM | POA: Diagnosis present

## 2020-03-13 DIAGNOSIS — M1711 Unilateral primary osteoarthritis, right knee: Secondary | ICD-10-CM | POA: Diagnosis present

## 2020-03-13 DIAGNOSIS — E1151 Type 2 diabetes mellitus with diabetic peripheral angiopathy without gangrene: Secondary | ICD-10-CM | POA: Diagnosis present

## 2020-03-13 DIAGNOSIS — Z7982 Long term (current) use of aspirin: Secondary | ICD-10-CM | POA: Diagnosis not present

## 2020-03-13 DIAGNOSIS — I130 Hypertensive heart and chronic kidney disease with heart failure and stage 1 through stage 4 chronic kidney disease, or unspecified chronic kidney disease: Secondary | ICD-10-CM | POA: Diagnosis not present

## 2020-03-13 DIAGNOSIS — I10 Essential (primary) hypertension: Secondary | ICD-10-CM | POA: Diagnosis present

## 2020-03-13 DIAGNOSIS — Z20822 Contact with and (suspected) exposure to covid-19: Secondary | ICD-10-CM | POA: Insufficient documentation

## 2020-03-13 DIAGNOSIS — G8929 Other chronic pain: Secondary | ICD-10-CM | POA: Diagnosis present

## 2020-03-13 DIAGNOSIS — Z885 Allergy status to narcotic agent status: Secondary | ICD-10-CM | POA: Diagnosis not present

## 2020-03-13 DIAGNOSIS — Z01812 Encounter for preprocedural laboratory examination: Secondary | ICD-10-CM | POA: Insufficient documentation

## 2020-03-13 DIAGNOSIS — Z79899 Other long term (current) drug therapy: Secondary | ICD-10-CM | POA: Diagnosis not present

## 2020-03-13 DIAGNOSIS — D500 Iron deficiency anemia secondary to blood loss (chronic): Secondary | ICD-10-CM | POA: Diagnosis present

## 2020-03-13 DIAGNOSIS — N179 Acute kidney failure, unspecified: Secondary | ICD-10-CM | POA: Diagnosis not present

## 2020-03-13 DIAGNOSIS — Z471 Aftercare following joint replacement surgery: Secondary | ICD-10-CM | POA: Diagnosis not present

## 2020-03-13 DIAGNOSIS — Z880 Allergy status to penicillin: Secondary | ICD-10-CM | POA: Diagnosis not present

## 2020-03-13 LAB — SARS CORONAVIRUS 2 (TAT 6-24 HRS): SARS Coronavirus 2: NEGATIVE

## 2020-03-15 ENCOUNTER — Ambulatory Visit: Payer: Self-pay | Admitting: Orthopedic Surgery

## 2020-03-15 NOTE — H&P (View-Only) (Signed)
[name] is an 79 y.o. female.   Chief Complaint: R knee pain HPI: The patient is scheduled for a right total knee arthroplasty by Dr. Tonita Cong on 03/16/20 at [facility].  She has failed conservative treatment including injections, bracing, strengthening, medications, activity modifications and relative rest. Pain is interfering with quality-of-life and activities of daily living at this point. Dr. Tonita Cong and the patient mutually agreed to proceed with a right total knee replacement. Risks and benefits of the procedure were discussed including stiffness, suboptimal range of motion, persistent pain, infection requiring removal of prosthesis and reinsertion, need for prophylactic antibiotics in the future, for example, dental procedures, possible need for manipulation, revision in the future and also anesthetic complications including DVT, PE, etc. We discussed the perioperative course, time in the hospital, postoperative recovery and the need for elevation to control swelling. We also discussed the predicted range of motion and the probability that squatting and kneeling would be unobtainable in the future. In addition, postoperative anticoagulation was discussed. We have obtained preoperative medical clearance as necessary. Provided illustrated handout and discussed it in detail. They will enroll in the total joint replacement educational forum at the hospital.  She has been cleared medically and has been scheduled for a preoperative IVC filter with Dr. Donzetta Matters due to prior hx of DVT and PE. She does have a history of gout.      Past Medical History:  Diagnosis Date  . Anemia   . Arthritis   . Bell's palsy   . CHF (congestive heart failure) ([place])   . GERD (gastroesophageal reflux disease)   . Hemochromatosis   . History of hiatal hernia   . Hypertension   . Iron deficiency anemia due to chronic blood loss 03/10/2017  . PE (pulmonary embolism)   . Pneumonia    "walking"  pneumonia  . Prothrombin gene mutation ([place]) 05/30/2013  . Restless legs   . Right leg DVT ([place]) 05/30/2013  . Stroke [facility])    found on a MRI, she's not aware otherwise         Past Surgical History:  Procedure Laterality Date  . BACK SURGERY    . COLONOSCOPY    . RIGHT/LEFT HEART CATH AND CORONARY ANGIOGRAPHY N/A 07/12/2019   Procedure: RIGHT/LEFT HEART CATH AND CORONARY ANGIOGRAPHY;  Surgeon: Troy Sine, MD;  Location: [place] [facility];  Service: Cardiovascular;  Laterality: N/A;  . SHOULDER SURGERY Bilateral          Family History  Problem Relation Age of Onset  . Congestive Heart Failure Mother   . Pancreatic cancer Father    Social History:  reports that she has never smoked. She has never used smokeless tobacco. She reports that she does not drink alcohol and does not use drugs.  Allergies:       Allergies  Allergen Reactions  . Morphine And Related Other (See Comments)    LARGER DOSES OF MORPHINE CAUSES BODY TWITCHING  . Penicillins Anaphylaxis    Childhood reaction Has patient had a PCN reaction causing immediate rash, facial/tongue/throat swelling, SOB or lightheadedness with hypotension: Yes Has patient had a PCN reaction causing severe rash involving mucus membranes or skin necrosis: Unknown Has patient had a PCN reaction that required hospitalization: No Has patient had a PCN reaction occurring within the last 10 years: no (5 or 79 yrs old) If all of the above answers are "NO", then may proceed with Cephalosporin use.   . Sulfa Antibiotics Other (See  Comments)    Unknown childhood reaction   Medications: aspirin atorvastatin 40 mg tablet biotin calcium citalopram 20 mg tablet colchicine 0.6 mg tablet Concentrated Fiber Fish OiL 1,200 mg (144 mg-216 mg) capsule furosemide 80 mg tablet iron losartan 50 mg tablet metoprolol succinate ER 25 mg tablet,extended release 24 hr omeprazole 20 mg capsule,delayed  release oxyCODONE-acetaminophen 5 mg-325 mg tablet pramipexole 0.5 mg tablet Restasis 0.05 % eye drops in a dropperette temazepam 15 mg capsule turmeric Vitamin D vitamin E  Review of Systems  Constitutional: Negative.   HENT: Negative.   Eyes: Negative.   Respiratory: Negative.   Cardiovascular: Negative.   Gastrointestinal: Negative.   Endocrine: Negative.   Genitourinary: Negative.   Musculoskeletal: Positive for arthralgias and joint swelling.  Skin: Negative.   Neurological: Negative.   Psychiatric/Behavioral: Negative.     There were no vitals taken for this visit. Physical Exam Constitutional:      Appearance: Normal appearance.  HENT:     Head: Atraumatic.     Right Ear: External ear normal.     Left Ear: External ear normal.     Nose: Nose normal.     Mouth/Throat:     Pharynx: Oropharynx is clear.  Cardiovascular:     Rate and Rhythm: Normal rate and regular rhythm.     Pulses: Normal pulses.  Pulmonary:     Effort: Pulmonary effort is normal.  Abdominal:     Palpations: Abdomen is soft.  Musculoskeletal:     Cervical back: Normal range of motion.     Comments: Patient is a 79 year old female.  On exam she walks an antalgic gait. Tender medial joint line. Range is 0-1 30. Patellofemoral pain compression. No significant effusion. Please see hospital record for full history and physical.  Skin:    General: Skin is warm and dry.  Neurological:     General: No focal deficit present.     Mental Status: She is alert.    X-rays with end-stage degenerative changes right knee.  Assessment/Plan Impression: Right knee end-stage degenerative osteoarthritis  Plan: Pt with end-stage right knee DJD, bone-on-bone, refractory to conservative tx, scheduled for right total knee replacement by Dr. Tonita Cong on 03/16/2020. We again discussed the procedure itself as well as risks, complications and alternatives, including but not limited to DVT, PE, infx, bleeding,  failure of procedure, need for secondary procedure including manipulation, nerve injury, ongoing pain/symptoms, anesthesia risk, even stroke or death. Also discussed typical post-op protocols, activity restrictions, need for PT, flexion/extension exercises, time out of work. Discussed need for DVT ppx post-op per protocol. Discussed dental ppx and infx prevention. Also discussed limitations post-operatively such as kneeling and squatting. All questions were answered. Patient desires to proceed with surgery as scheduled.  Will hold supplements, ASA and NSAIDs accordingly. Will remain NPO after midnight the night before surgery. [facility] to [facility] pre-op testing. She has also been scheduled for a preoperative IVC filter 2 days prior to surgery. Anticipate hospital stay to include at least 2 midnights given medical history and to ensure proper pain control. Plan Xarelto for DVT ppx post-op, given her history of PE and DVT. Plan oxycodone, Robaxin, Colace, Miralax. Plan home with HHPT post-op with family members at home for assistance, then transition to outpatient PT. Will follow up 10-14 days post-op for suture removal and xrays.  Plan RIGHT total knee replacement  Cecilie Kicks, PA-C for Dr. Tonita Cong

## 2020-03-15 NOTE — H&P (Signed)
[name] is an 79 y.o. female.   Chief Complaint: R knee pain HPI: The patient is scheduled for a right total knee arthroplasty by Dr. Tonita Cong on 03/16/20 at [facility].  She has failed conservative treatment including injections, bracing, strengthening, medications, activity modifications and relative rest. Pain is interfering with quality-of-life and activities of daily living at this point. Dr. Tonita Cong and the patient mutually agreed to proceed with a right total knee replacement. Risks and benefits of the procedure were discussed including stiffness, suboptimal range of motion, persistent pain, infection requiring removal of prosthesis and reinsertion, need for prophylactic antibiotics in the future, for example, dental procedures, possible need for manipulation, revision in the future and also anesthetic complications including DVT, PE, etc. We discussed the perioperative course, time in the hospital, postoperative recovery and the need for elevation to control swelling. We also discussed the predicted range of motion and the probability that squatting and kneeling would be unobtainable in the future. In addition, postoperative anticoagulation was discussed. We have obtained preoperative medical clearance as necessary. Provided illustrated handout and discussed it in detail. They will enroll in the total joint replacement educational forum at the hospital.  She has been cleared medically and has been scheduled for a preoperative IVC filter with Dr. Donzetta Matters due to prior hx of DVT and PE. She does have a history of gout.      Past Medical History:  Diagnosis Date  . Anemia   . Arthritis   . Bell's palsy   . CHF (congestive heart failure) ([place])   . GERD (gastroesophageal reflux disease)   . Hemochromatosis   . History of hiatal hernia   . Hypertension   . Iron deficiency anemia due to chronic blood loss 03/10/2017  . PE (pulmonary embolism)   . Pneumonia    "walking"  pneumonia  . Prothrombin gene mutation ([place]) 05/30/2013  . Restless legs   . Right leg DVT ([place]) 05/30/2013  . Stroke [facility])    found on a MRI, she's not aware otherwise         Past Surgical History:  Procedure Laterality Date  . BACK SURGERY    . COLONOSCOPY    . RIGHT/LEFT HEART CATH AND CORONARY ANGIOGRAPHY N/A 07/12/2019   Procedure: RIGHT/LEFT HEART CATH AND CORONARY ANGIOGRAPHY;  Surgeon: Troy Sine, MD;  Location: [place] [facility];  Service: Cardiovascular;  Laterality: N/A;  . SHOULDER SURGERY Bilateral          Family History  Problem Relation Age of Onset  . Congestive Heart Failure Mother   . Pancreatic cancer Father    Social History:  reports that she has never smoked. She has never used smokeless tobacco. She reports that she does not drink alcohol and does not use drugs.  Allergies:       Allergies  Allergen Reactions  . Morphine And Related Other (See Comments)    LARGER DOSES OF MORPHINE CAUSES BODY TWITCHING  . Penicillins Anaphylaxis    Childhood reaction Has patient had a PCN reaction causing immediate rash, facial/tongue/throat swelling, SOB or lightheadedness with hypotension: Yes Has patient had a PCN reaction causing severe rash involving mucus membranes or skin necrosis: Unknown Has patient had a PCN reaction that required hospitalization: No Has patient had a PCN reaction occurring within the last 10 years: no (5 or 79 yrs old) If all of the above answers are "NO", then may proceed with Cephalosporin use.   . Sulfa Antibiotics Other (See  Comments)    Unknown childhood reaction   Medications: aspirin atorvastatin 40 mg tablet biotin calcium citalopram 20 mg tablet colchicine 0.6 mg tablet Concentrated Fiber Fish OiL 1,200 mg (144 mg-216 mg) capsule furosemide 80 mg tablet iron losartan 50 mg tablet metoprolol succinate ER 25 mg tablet,extended release 24 hr omeprazole 20 mg capsule,delayed  release oxyCODONE-acetaminophen 5 mg-325 mg tablet pramipexole 0.5 mg tablet Restasis 0.05 % eye drops in a dropperette temazepam 15 mg capsule turmeric Vitamin D vitamin E  Review of Systems  Constitutional: Negative.   HENT: Negative.   Eyes: Negative.   Respiratory: Negative.   Cardiovascular: Negative.   Gastrointestinal: Negative.   Endocrine: Negative.   Genitourinary: Negative.   Musculoskeletal: Positive for arthralgias and joint swelling.  Skin: Negative.   Neurological: Negative.   Psychiatric/Behavioral: Negative.     There were no vitals taken for this visit. Physical Exam Constitutional:      Appearance: Normal appearance.  HENT:     Head: Atraumatic.     Right Ear: External ear normal.     Left Ear: External ear normal.     Nose: Nose normal.     Mouth/Throat:     Pharynx: Oropharynx is clear.  Cardiovascular:     Rate and Rhythm: Normal rate and regular rhythm.     Pulses: Normal pulses.  Pulmonary:     Effort: Pulmonary effort is normal.  Abdominal:     Palpations: Abdomen is soft.  Musculoskeletal:     Cervical back: Normal range of motion.     Comments: Patient is a 79 year old female.  On exam she walks an antalgic gait. Tender medial joint line. Range is 0-1 30. Patellofemoral pain compression. No significant effusion. Please see hospital record for full history and physical.  Skin:    General: Skin is warm and dry.  Neurological:     General: No focal deficit present.     Mental Status: She is alert.    X-rays with end-stage degenerative changes right knee.  Assessment/Plan Impression: Right knee end-stage degenerative osteoarthritis  Plan: Pt with end-stage right knee DJD, bone-on-bone, refractory to conservative tx, scheduled for right total knee replacement by Dr. Tonita Cong on 03/16/2020. We again discussed the procedure itself as well as risks, complications and alternatives, including but not limited to DVT, PE, infx, bleeding,  failure of procedure, need for secondary procedure including manipulation, nerve injury, ongoing pain/symptoms, anesthesia risk, even stroke or death. Also discussed typical post-op protocols, activity restrictions, need for PT, flexion/extension exercises, time out of work. Discussed need for DVT ppx post-op per protocol. Discussed dental ppx and infx prevention. Also discussed limitations post-operatively such as kneeling and squatting. All questions were answered. Patient desires to proceed with surgery as scheduled.  Will hold supplements, ASA and NSAIDs accordingly. Will remain NPO after midnight the night before surgery. Will present to [facility] for pre-op testing. She has also been scheduled for a preoperative IVC filter 2 days prior to surgery. Anticipate hospital stay to include at least 2 midnights given medical history and to ensure proper pain control. Plan Xarelto for DVT ppx post-op, given her history of PE and DVT. Plan oxycodone, Robaxin, Colace, Miralax. Plan home with HHPT post-op with family members at home for assistance, then transition to outpatient PT. Will follow up 10-14 days post-op for suture removal and xrays.  Plan RIGHT total knee replacement  Cecilie Kicks, PA-C for Dr. Tonita Cong

## 2020-03-16 ENCOUNTER — Other Ambulatory Visit: Payer: Self-pay

## 2020-03-16 ENCOUNTER — Encounter (HOSPITAL_COMMUNITY): Payer: Self-pay | Admitting: Specialist

## 2020-03-16 ENCOUNTER — Other Ambulatory Visit (HOSPITAL_COMMUNITY): Payer: Self-pay | Admitting: Specialist

## 2020-03-16 ENCOUNTER — Inpatient Hospital Stay (HOSPITAL_COMMUNITY)
Admission: RE | Admit: 2020-03-16 | Discharge: 2020-03-17 | DRG: 470 | Disposition: A | Discharge status: Home / Self Care | Payer: PPO | Attending: Specialist | Admitting: Specialist

## 2020-03-16 ENCOUNTER — Encounter (HOSPITAL_COMMUNITY)
Admission: RE | Disposition: A | Discharge status: Home / Self Care | Payer: Self-pay | Source: Other Acute Inpatient Hospital | Attending: Specialist

## 2020-03-16 ENCOUNTER — Inpatient Hospital Stay (HOSPITAL_COMMUNITY): Payer: PPO | Admitting: Anesthesiology

## 2020-03-16 ENCOUNTER — Inpatient Hospital Stay (HOSPITAL_COMMUNITY): Payer: PPO

## 2020-03-16 DIAGNOSIS — G8929 Other chronic pain: Secondary | ICD-10-CM | POA: Diagnosis present

## 2020-03-16 DIAGNOSIS — M109 Gout, unspecified: Secondary | ICD-10-CM | POA: Diagnosis present

## 2020-03-16 DIAGNOSIS — E1151 Type 2 diabetes mellitus with diabetic peripheral angiopathy without gangrene: Secondary | ICD-10-CM | POA: Diagnosis present

## 2020-03-16 DIAGNOSIS — I252 Old myocardial infarction: Secondary | ICD-10-CM

## 2020-03-16 DIAGNOSIS — Z86718 Personal history of other venous thrombosis and embolism: Secondary | ICD-10-CM

## 2020-03-16 DIAGNOSIS — Z882 Allergy status to sulfonamides status: Secondary | ICD-10-CM | POA: Diagnosis not present

## 2020-03-16 DIAGNOSIS — K769 Liver disease, unspecified: Secondary | ICD-10-CM | POA: Diagnosis present

## 2020-03-16 DIAGNOSIS — G510 Bell's palsy: Secondary | ICD-10-CM | POA: Diagnosis present

## 2020-03-16 DIAGNOSIS — M19019 Primary osteoarthritis, unspecified shoulder: Secondary | ICD-10-CM | POA: Diagnosis present

## 2020-03-16 DIAGNOSIS — D500 Iron deficiency anemia secondary to blood loss (chronic): Secondary | ICD-10-CM | POA: Diagnosis present

## 2020-03-16 DIAGNOSIS — I2510 Atherosclerotic heart disease of native coronary artery without angina pectoris: Secondary | ICD-10-CM | POA: Diagnosis present

## 2020-03-16 DIAGNOSIS — Z79899 Other long term (current) drug therapy: Secondary | ICD-10-CM | POA: Diagnosis not present

## 2020-03-16 DIAGNOSIS — Z8673 Personal history of transient ischemic attack (TIA), and cerebral infarction without residual deficits: Secondary | ICD-10-CM

## 2020-03-16 DIAGNOSIS — Z7982 Long term (current) use of aspirin: Secondary | ICD-10-CM | POA: Diagnosis not present

## 2020-03-16 DIAGNOSIS — Z800 Family history of malignant neoplasm of digestive organs: Secondary | ICD-10-CM

## 2020-03-16 DIAGNOSIS — Z8249 Family history of ischemic heart disease and other diseases of the circulatory system: Secondary | ICD-10-CM

## 2020-03-16 DIAGNOSIS — Z7901 Long term (current) use of anticoagulants: Secondary | ICD-10-CM | POA: Diagnosis not present

## 2020-03-16 DIAGNOSIS — Z86711 Personal history of pulmonary embolism: Secondary | ICD-10-CM

## 2020-03-16 DIAGNOSIS — M1711 Unilateral primary osteoarthritis, right knee: Secondary | ICD-10-CM | POA: Diagnosis present

## 2020-03-16 DIAGNOSIS — Z20822 Contact with and (suspected) exposure to covid-19: Secondary | ICD-10-CM | POA: Diagnosis present

## 2020-03-16 DIAGNOSIS — J449 Chronic obstructive pulmonary disease, unspecified: Secondary | ICD-10-CM | POA: Diagnosis present

## 2020-03-16 DIAGNOSIS — Z880 Allergy status to penicillin: Secondary | ICD-10-CM

## 2020-03-16 DIAGNOSIS — I10 Essential (primary) hypertension: Secondary | ICD-10-CM | POA: Diagnosis present

## 2020-03-16 DIAGNOSIS — Z96659 Presence of unspecified artificial knee joint: Secondary | ICD-10-CM

## 2020-03-16 DIAGNOSIS — Z885 Allergy status to narcotic agent status: Secondary | ICD-10-CM

## 2020-03-16 DIAGNOSIS — K219 Gastro-esophageal reflux disease without esophagitis: Secondary | ICD-10-CM | POA: Diagnosis present

## 2020-03-16 HISTORY — PX: TOTAL KNEE ARTHROPLASTY: SHX125

## 2020-03-16 LAB — TYPE AND SCREEN
ABO/RH(D): O POS
Antibody Screen: NEGATIVE

## 2020-03-16 SURGERY — ARTHROPLASTY, KNEE, TOTAL
Anesthesia: Spinal | Site: Knee | Laterality: Right

## 2020-03-16 MED ORDER — SODIUM CHLORIDE 0.9 % IR SOLN
Status: DC | PRN
  Administered 2020-03-16: 1000 mL

## 2020-03-16 MED ORDER — DEXAMETHASONE SODIUM PHOSPHATE 10 MG/ML IJ SOLN
INTRAMUSCULAR | Status: AC
  Filled 2020-03-16: qty 1

## 2020-03-16 MED ORDER — METOCLOPRAMIDE HCL 5 MG/ML IJ SOLN: 5.0000 mg | Freq: Three times a day (TID) | INTRAMUSCULAR | Status: DC | PRN

## 2020-03-16 MED ORDER — LIDOCAINE 2% (20 MG/ML) 5 ML SYRINGE
INTRAMUSCULAR | Status: AC
  Filled 2020-03-16: qty 5

## 2020-03-16 MED ORDER — PHENYLEPHRINE HCL-NACL 10-0.9 MG/250ML-% IV SOLN
INTRAVENOUS | Status: DC | PRN
  Administered 2020-03-16: 25 ug/min via INTRAVENOUS

## 2020-03-16 MED ORDER — MEPERIDINE HCL 50 MG/ML IJ SOLN: 6.2500 mg | INTRAMUSCULAR | Status: DC | PRN

## 2020-03-16 MED ORDER — ALLOPURINOL 100 MG PO TABS
100.0000 mg | ORAL_TABLET | Freq: Every day | ORAL | Status: DC
  Administered 2020-03-16 – 2020-03-17 (×2): 100 mg via ORAL
  Filled 2020-03-16 (×2): qty 1

## 2020-03-16 MED ORDER — APIXABAN 2.5 MG PO TABS: 2.5000 mg | ORAL_TABLET | Freq: Two times a day (BID) | ORAL | 1 refills | Status: DC

## 2020-03-16 MED ORDER — PANTOPRAZOLE SODIUM 40 MG PO TBEC
40.0000 mg | DELAYED_RELEASE_TABLET | Freq: Every day | ORAL | Status: DC
  Administered 2020-03-16 – 2020-03-17 (×2): 40 mg via ORAL
  Filled 2020-03-16 (×2): qty 1

## 2020-03-16 MED ORDER — PHENOL 1.4 % MT LIQD: 1.0000 | OROMUCOSAL | Status: DC | PRN

## 2020-03-16 MED ORDER — LOSARTAN POTASSIUM 50 MG PO TABS
50.0000 mg | ORAL_TABLET | Freq: Every day | ORAL | Status: DC
  Administered 2020-03-17 (×2): 50 mg via ORAL
  Filled 2020-03-16 (×2): qty 1

## 2020-03-16 MED ORDER — GENTAMICIN SULFATE 40 MG/ML IJ SOLN
5.0000 mg/kg | INTRAVENOUS | Status: AC
  Administered 2020-03-16: 310 mg via INTRAVENOUS
  Filled 2020-03-16: qty 7.75

## 2020-03-16 MED ORDER — VANCOMYCIN HCL IN DEXTROSE 1-5 GM/200ML-% IV SOLN
1000.0000 mg | Freq: Two times a day (BID) | INTRAVENOUS | Status: AC
  Administered 2020-03-16: 1000 mg via INTRAVENOUS
  Filled 2020-03-16: qty 200

## 2020-03-16 MED ORDER — PROPOFOL 10 MG/ML IV BOLUS
INTRAVENOUS | Status: AC
  Filled 2020-03-16: qty 20

## 2020-03-16 MED ORDER — GENTAMICIN IN SALINE 1-0.9 MG/ML-% IV SOLN: 100.0000 mg | INTRAVENOUS | Status: DC

## 2020-03-16 MED ORDER — BISACODYL 5 MG PO TBEC: 5.0000 mg | DELAYED_RELEASE_TABLET | Freq: Every day | ORAL | Status: DC | PRN

## 2020-03-16 MED ORDER — POLYETHYLENE GLYCOL 3350 17 G PO PACK: 17.0000 g | PACK | Freq: Every day | ORAL | 0 refills | Status: DC

## 2020-03-16 MED ORDER — ROPIVACAINE HCL 7.5 MG/ML IJ SOLN
INTRAMUSCULAR | Status: DC | PRN
  Administered 2020-03-16 (×4): 5 mL via PERINEURAL

## 2020-03-16 MED ORDER — TRANEXAMIC ACID-NACL 1000-0.7 MG/100ML-% IV SOLN
1000.0000 mg | INTRAVENOUS | Status: AC
  Administered 2020-03-16: 1000 mg via INTRAVENOUS
  Filled 2020-03-16: qty 100

## 2020-03-16 MED ORDER — DEXAMETHASONE SODIUM PHOSPHATE 10 MG/ML IJ SOLN
INTRAMUSCULAR | Status: DC | PRN
  Administered 2020-03-16: 10 mg

## 2020-03-16 MED ORDER — RISAQUAD PO CAPS
1.0000 | ORAL_CAPSULE | Freq: Every day | ORAL | Status: DC
  Administered 2020-03-16 – 2020-03-17 (×2): 1 via ORAL
  Filled 2020-03-16 (×2): qty 1

## 2020-03-16 MED ORDER — OXYCODONE-ACETAMINOPHEN 5-325 MG PO TABS
1.0000 | ORAL_TABLET | ORAL | 0 refills | Status: DC | PRN
Start: 2020-03-16 — End: 2020-03-16

## 2020-03-16 MED ORDER — ADULT MULTIVITAMIN W/MINERALS CH
1.0000 | ORAL_TABLET | Freq: Every day | ORAL | Status: DC
  Administered 2020-03-16 – 2020-03-17 (×2): 1 via ORAL
  Filled 2020-03-16 (×2): qty 1

## 2020-03-16 MED ORDER — ONDANSETRON HCL 4 MG/2ML IJ SOLN: 4.0000 mg | Freq: Once | INTRAMUSCULAR | Status: DC | PRN

## 2020-03-16 MED ORDER — ALUM & MAG HYDROXIDE-SIMETH 200-200-20 MG/5ML PO SUSP: 30.0000 mL | ORAL | Status: DC | PRN

## 2020-03-16 MED ORDER — DOCUSATE SODIUM 100 MG PO CAPS
100.0000 mg | ORAL_CAPSULE | Freq: Two times a day (BID) | ORAL | Status: DC
  Administered 2020-03-16 – 2020-03-17 (×2): 100 mg via ORAL
  Filled 2020-03-16 (×2): qty 1

## 2020-03-16 MED ORDER — CHLORHEXIDINE GLUCONATE 0.12 % MT SOLN
15.0000 mL | Freq: Once | OROMUCOSAL | Status: AC
  Administered 2020-03-16: 15 mL via OROMUCOSAL

## 2020-03-16 MED ORDER — PHENYLEPHRINE HCL (PRESSORS) 10 MG/ML IV SOLN
INTRAVENOUS | Status: AC
  Filled 2020-03-16: qty 1

## 2020-03-16 MED ORDER — FENTANYL CITRATE (PF) 100 MCG/2ML IJ SOLN
INTRAMUSCULAR | Status: AC
  Filled 2020-03-16: qty 2

## 2020-03-16 MED ORDER — TEMAZEPAM 15 MG PO CAPS
15.0000 mg | ORAL_CAPSULE | Freq: Every day | ORAL | Status: DC
  Filled 2020-03-16: qty 1

## 2020-03-16 MED ORDER — LACTATED RINGERS IV SOLN: INTRAVENOUS | Status: DC

## 2020-03-16 MED ORDER — BUPIVACAINE-EPINEPHRINE 0.25% -1:200000 IJ SOLN
INTRAMUSCULAR | Status: DC | PRN
  Administered 2020-03-16: 38 mL

## 2020-03-16 MED ORDER — FENTANYL CITRATE (PF) 100 MCG/2ML IJ SOLN
INTRAMUSCULAR | Status: DC | PRN
  Administered 2020-03-16: 100 ug via INTRAVENOUS

## 2020-03-16 MED ORDER — ONDANSETRON HCL 4 MG/2ML IJ SOLN
INTRAMUSCULAR | Status: AC
  Filled 2020-03-16: qty 2

## 2020-03-16 MED ORDER — ACETAMINOPHEN 10 MG/ML IV SOLN: 1000.0000 mg | Freq: Once | INTRAVENOUS | Status: DC | PRN

## 2020-03-16 MED ORDER — ROPIVACAINE HCL 5 MG/ML IJ SOLN
INTRAMUSCULAR | Status: DC | PRN
  Administered 2020-03-16 (×2): 5 mL via PERINEURAL

## 2020-03-16 MED ORDER — POLYETHYLENE GLYCOL 3350 17 G PO PACK: 17.0000 g | PACK | Freq: Every day | ORAL | Status: DC | PRN

## 2020-03-16 MED ORDER — ACETAMINOPHEN 10 MG/ML IV SOLN
1000.0000 mg | INTRAVENOUS | Status: AC
  Administered 2020-03-16: 1000 mg via INTRAVENOUS
  Filled 2020-03-16: qty 100

## 2020-03-16 MED ORDER — CLONIDINE HCL (ANALGESIA) 100 MCG/ML EP SOLN
EPIDURAL | Status: DC | PRN
  Administered 2020-03-16: 50 ug

## 2020-03-16 MED ORDER — ACETAMINOPHEN 500 MG PO TABS
1000.0000 mg | ORAL_TABLET | Freq: Four times a day (QID) | ORAL | Status: AC
  Administered 2020-03-16 – 2020-03-17 (×4): 1000 mg via ORAL
  Filled 2020-03-16 (×4): qty 2

## 2020-03-16 MED ORDER — FENTANYL CITRATE (PF) 100 MCG/2ML IJ SOLN
50.0000 ug | Freq: Once | INTRAMUSCULAR | Status: AC
  Administered 2020-03-16: 100 ug via INTRAVENOUS
  Filled 2020-03-16: qty 2

## 2020-03-16 MED ORDER — METOPROLOL SUCCINATE ER 25 MG PO TB24
25.0000 mg | ORAL_TABLET | Freq: Every day | ORAL | Status: DC
  Administered 2020-03-17: 25 mg via ORAL
  Filled 2020-03-16: qty 1

## 2020-03-16 MED ORDER — STERILE WATER FOR IRRIGATION IR SOLN
Status: DC | PRN
  Administered 2020-03-16: 2000 mL

## 2020-03-16 MED ORDER — MIDAZOLAM HCL 2 MG/2ML IJ SOLN: 1.0000 mg | INTRAMUSCULAR | Status: DC

## 2020-03-16 MED ORDER — CYCLOSPORINE 0.05 % OP EMUL
1.0000 [drp] | Freq: Two times a day (BID) | OPHTHALMIC | Status: DC
  Administered 2020-03-16 – 2020-03-17 (×2): 1 [drp] via OPHTHALMIC
  Filled 2020-03-16 (×2): qty 1

## 2020-03-16 MED ORDER — MAGNESIUM CITRATE PO SOLN: 1.0000 | Freq: Once | ORAL | Status: DC | PRN

## 2020-03-16 MED ORDER — BUPIVACAINE-EPINEPHRINE (PF) 0.25% -1:200000 IJ SOLN
INTRAMUSCULAR | Status: AC
  Filled 2020-03-16: qty 60

## 2020-03-16 MED ORDER — ONDANSETRON HCL 4 MG PO TABS: 4.0000 mg | ORAL_TABLET | Freq: Four times a day (QID) | ORAL | Status: DC | PRN

## 2020-03-16 MED ORDER — ONDANSETRON HCL 4 MG/2ML IJ SOLN: 4.0000 mg | Freq: Four times a day (QID) | INTRAMUSCULAR | Status: DC | PRN

## 2020-03-16 MED ORDER — MIDAZOLAM HCL 2 MG/2ML IJ SOLN
INTRAMUSCULAR | Status: AC
  Administered 2020-03-16: 2 mg via INTRAVENOUS
  Filled 2020-03-16: qty 2

## 2020-03-16 MED ORDER — DOCUSATE SODIUM 100 MG PO CAPS: 100.0000 mg | ORAL_CAPSULE | Freq: Two times a day (BID) | ORAL | 2 refills | Status: AC

## 2020-03-16 MED ORDER — MIDAZOLAM HCL 2 MG/2ML IJ SOLN
INTRAMUSCULAR | Status: AC
  Filled 2020-03-16: qty 2

## 2020-03-16 MED ORDER — FENTANYL CITRATE (PF) 100 MCG/2ML IJ SOLN: 25.0000 ug | INTRAMUSCULAR | Status: DC | PRN

## 2020-03-16 MED ORDER — PROPOFOL 1000 MG/100ML IV EMUL
INTRAVENOUS | Status: AC
  Filled 2020-03-16: qty 100

## 2020-03-16 MED ORDER — HYDROMORPHONE HCL 1 MG/ML IJ SOLN: 0.5000 mg | INTRAMUSCULAR | Status: DC | PRN

## 2020-03-16 MED ORDER — CALCIUM POLYCARBOPHIL 625 MG PO TABS
625.0000 mg | ORAL_TABLET | Freq: Every day | ORAL | Status: DC
  Administered 2020-03-16 – 2020-03-17 (×2): 625 mg via ORAL
  Filled 2020-03-16 (×2): qty 1

## 2020-03-16 MED ORDER — OXYCODONE HCL 5 MG PO TABS
10.0000 mg | ORAL_TABLET | ORAL | Status: DC | PRN
  Administered 2020-03-17: 10 mg via ORAL
  Filled 2020-03-16: qty 2

## 2020-03-16 MED ORDER — IRRISEPT - 450ML BOTTLE WITH 0.05% CHG IN STERILE WATER, USP 99.95% OPTIME
TOPICAL | Status: DC | PRN
  Administered 2020-03-16: 450 mL via TOPICAL

## 2020-03-16 MED ORDER — COLCHICINE 0.6 MG PO TABS: 0.6000 mg | ORAL_TABLET | ORAL | Status: DC

## 2020-03-16 MED ORDER — CITALOPRAM HYDROBROMIDE 20 MG PO TABS
20.0000 mg | ORAL_TABLET | Freq: Every day | ORAL | Status: DC
  Administered 2020-03-16: 20 mg via ORAL
  Filled 2020-03-16: qty 1

## 2020-03-16 MED ORDER — ORAL CARE MOUTH RINSE: 15.0000 mL | Freq: Once | OROMUCOSAL | Status: AC

## 2020-03-16 MED ORDER — TRANEXAMIC ACID 1000 MG/10ML IV SOLN
2000.0000 mg | Freq: Once | INTRAVENOUS | Status: DC
  Filled 2020-03-16: qty 20

## 2020-03-16 MED ORDER — APIXABAN 2.5 MG PO TABS
2.5000 mg | ORAL_TABLET | Freq: Two times a day (BID) | ORAL | Status: DC
  Administered 2020-03-17: 2.5 mg via ORAL
  Filled 2020-03-16: qty 1

## 2020-03-16 MED ORDER — PROPOFOL 500 MG/50ML IV EMUL
INTRAVENOUS | Status: DC | PRN
  Administered 2020-03-16: 75 ug/kg/min via INTRAVENOUS

## 2020-03-16 MED ORDER — MENTHOL 3 MG MT LOZG: 1.0000 | LOZENGE | OROMUCOSAL | Status: DC | PRN

## 2020-03-16 MED ORDER — OXYCODONE HCL 5 MG PO TABS
5.0000 mg | ORAL_TABLET | ORAL | Status: DC | PRN
  Administered 2020-03-16 – 2020-03-17 (×2): 10 mg via ORAL
  Filled 2020-03-16 (×2): qty 2

## 2020-03-16 MED ORDER — METOCLOPRAMIDE HCL 5 MG PO TABS: 5.0000 mg | ORAL_TABLET | Freq: Three times a day (TID) | ORAL | Status: DC | PRN

## 2020-03-16 MED ORDER — PRAMIPEXOLE DIHYDROCHLORIDE 0.25 MG PO TABS
0.5000 mg | ORAL_TABLET | Freq: Every day | ORAL | Status: DC
  Administered 2020-03-16: 0.5 mg via ORAL
  Filled 2020-03-16: qty 2

## 2020-03-16 MED ORDER — KCL IN DEXTROSE-NACL 20-5-0.45 MEQ/L-%-% IV SOLN
INTRAVENOUS | Status: DC
  Filled 2020-03-16: qty 1000

## 2020-03-16 MED ORDER — VANCOMYCIN HCL IN DEXTROSE 1-5 GM/200ML-% IV SOLN
1000.0000 mg | INTRAVENOUS | Status: AC
  Administered 2020-03-16: 1000 mg via INTRAVENOUS
  Filled 2020-03-16: qty 200

## 2020-03-16 MED ORDER — SUCCINYLCHOLINE CHLORIDE 200 MG/10ML IV SOSY
PREFILLED_SYRINGE | INTRAVENOUS | Status: AC
  Filled 2020-03-16: qty 10

## 2020-03-16 MED ORDER — ONDANSETRON HCL 4 MG/2ML IJ SOLN
INTRAMUSCULAR | Status: DC | PRN
  Administered 2020-03-16: 4 mg via INTRAVENOUS

## 2020-03-16 MED ORDER — ACETAMINOPHEN 325 MG PO TABS: 325.0000 mg | ORAL_TABLET | Freq: Four times a day (QID) | ORAL | Status: DC | PRN

## 2020-03-16 MED ORDER — POLYVINYL ALCOHOL 1.4 % OP SOLN
1.0000 [drp] | Freq: Three times a day (TID) | OPHTHALMIC | Status: DC | PRN
  Filled 2020-03-16: qty 15

## 2020-03-16 MED ORDER — DIPHENHYDRAMINE HCL 12.5 MG/5ML PO ELIX: 12.5000 mg | ORAL_SOLUTION | ORAL | Status: DC | PRN

## 2020-03-16 MED ORDER — FUROSEMIDE 40 MG PO TABS
80.0000 mg | ORAL_TABLET | Freq: Every day | ORAL | Status: DC
  Administered 2020-03-17: 80 mg via ORAL
  Filled 2020-03-16: qty 2

## 2020-03-16 MED FILL — OXYCODONE-APAP 5-325MG: 5-325 | 5 days supply | Qty: 40 | Fill #0

## 2020-03-16 MED FILL — ELIQUIS 2.5 MG TABLET: 2.5 | 30 days supply | Qty: 60 | Fill #0

## 2020-03-16 SURGICAL SUPPLY — 80 items
ATTUNE PS FEM RT SZ 5 CEM KNEE (Femur) ×2 IMPLANT
ATTUNE PSRP INSR SZ5 6 KNEE (Insert) ×2 IMPLANT
BAG DECANTER FOR FLEXI CONT (MISCELLANEOUS) ×2 IMPLANT
BAG ZIPLOCK 12X15 (MISCELLANEOUS) IMPLANT
BASE TIBIAL ROT PLAT SZ 5 KNEE (Knees) ×1 IMPLANT
BLADE SAG 18X100X1.27 (BLADE) ×2 IMPLANT
BLADE SAW SGTL 11.0X1.19X90.0M (BLADE) ×2 IMPLANT
BLADE SAW SGTL 13.0X1.19X90.0M (BLADE) ×2 IMPLANT
BLADE SURG SZ10 CARB STEEL (BLADE) ×4 IMPLANT
BNDG COHESIVE 4X5 TAN STRL (GAUZE/BANDAGES/DRESSINGS) ×2 IMPLANT
BNDG ELASTIC 3X5.8 VLCR STR LF (GAUZE/BANDAGES/DRESSINGS) ×2 IMPLANT
BNDG ELASTIC 4X5.8 VLCR STR LF (GAUZE/BANDAGES/DRESSINGS) ×2 IMPLANT
BNDG ELASTIC 6X10 VLCR STRL LF (GAUZE/BANDAGES/DRESSINGS) ×2 IMPLANT
BNDG ELASTIC 6X5.8 VLCR STR LF (GAUZE/BANDAGES/DRESSINGS) ×2 IMPLANT
CEMENT HV SMART SET (Cement) ×4 IMPLANT
COVER SURGICAL LIGHT HANDLE (MISCELLANEOUS) ×2 IMPLANT
COVER WAND RF STERILE (DRAPES) IMPLANT
CUFF TOURN SGL QUICK 34 (TOURNIQUET CUFF) ×2
CUFF TRNQT CYL 34X4.125X (TOURNIQUET CUFF) ×1 IMPLANT
DECANTER SPIKE VIAL GLASS SM (MISCELLANEOUS) ×2 IMPLANT
DRAPE INCISE IOBAN 66X45 STRL (DRAPES) IMPLANT
DRAPE ORTHO SPLIT 77X108 STRL (DRAPES) ×4
DRAPE SHEET LG 3/4 BI-LAMINATE (DRAPES) ×4 IMPLANT
DRAPE SURG ORHT 6 SPLT 77X108 (DRAPES) ×2 IMPLANT
DRAPE U-SHAPE 47X51 STRL (DRAPES) ×2 IMPLANT
DRSG AQUACEL AG ADV 3.5X10 (GAUZE/BANDAGES/DRESSINGS) ×2 IMPLANT
DRSG TEGADERM 4X4.75 (GAUZE/BANDAGES/DRESSINGS) IMPLANT
DURAPREP 26ML APPLICATOR (WOUND CARE) ×2 IMPLANT
ELECT BLADE TIP CTD 4 INCH (ELECTRODE) ×2 IMPLANT
ELECT REM PT RETURN 15FT ADLT (MISCELLANEOUS) ×2 IMPLANT
EVACUATOR 1/8 PVC DRAIN (DRAIN) IMPLANT
GAUZE SPONGE 2X2 8PLY STRL LF (GAUZE/BANDAGES/DRESSINGS) IMPLANT
GLOVE BIOGEL PI IND STRL 7.5 (GLOVE) ×1 IMPLANT
GLOVE BIOGEL PI IND STRL 8 (GLOVE) ×1 IMPLANT
GLOVE BIOGEL PI INDICATOR 7.5 (GLOVE) ×1
GLOVE BIOGEL PI INDICATOR 8 (GLOVE) ×1
GLOVE SURG SS PI 7.5 STRL IVOR (GLOVE) ×4 IMPLANT
GLOVE SURG SS PI 8.0 STRL IVOR (GLOVE) ×4 IMPLANT
GOWN STRL REUS W/TWL XL LVL3 (GOWN DISPOSABLE) ×4 IMPLANT
HANDPIECE INTERPULSE COAX TIP (DISPOSABLE) ×2
HEMOSTAT SPONGE AVITENE ULTRA (HEMOSTASIS) ×2 IMPLANT
HOLDER FOLEY CATH W/STRAP (MISCELLANEOUS) IMPLANT
IMMOBILIZER KNEE 20 (SOFTGOODS) ×2
IMMOBILIZER KNEE 20 THIGH 36 (SOFTGOODS) ×1 IMPLANT
JET LAVAGE IRRISEPT WOUND (IRRIGATION / IRRIGATOR) ×2
KIT TURNOVER KIT A (KITS) IMPLANT
LAVAGE JET IRRISEPT WOUND (IRRIGATION / IRRIGATOR) ×1 IMPLANT
MANIFOLD NEPTUNE II (INSTRUMENTS) ×2 IMPLANT
NDL SAFETY ECLIPSE 18X1.5 (NEEDLE) IMPLANT
NEEDLE HYPO 18GX1.5 SHARP (NEEDLE)
NS IRRIG 1000ML POUR BTL (IV SOLUTION) IMPLANT
PACK TOTAL KNEE CUSTOM (KITS) ×2 IMPLANT
PATELLA MEDIAL ATTUN 35MM KNEE (Knees) ×2 IMPLANT
PIN FIX SIGMA LCS THRD HI (PIN) ×2 IMPLANT
PIN STEINMAN FIXATION KNEE (PIN) ×2 IMPLANT
PROTECTOR NERVE ULNAR (MISCELLANEOUS) ×2 IMPLANT
SEALER BIPOLAR AQUA 6.0 (INSTRUMENTS) IMPLANT
SET HNDPC FAN SPRY TIP SCT (DISPOSABLE) ×1 IMPLANT
SPONGE GAUZE 2X2 STER 10/PKG (GAUZE/BANDAGES/DRESSINGS)
SPONGE SURGIFOAM ABS GEL 100 (HEMOSTASIS) IMPLANT
STAPLER VISISTAT (STAPLE) ×2 IMPLANT
STRIP CLOSURE SKIN 1/2X4 (GAUZE/BANDAGES/DRESSINGS) IMPLANT
SUT BONE WAX W31G (SUTURE) IMPLANT
SUT MNCRL AB 4-0 PS2 18 (SUTURE) IMPLANT
SUT STRATAFIX 0 PDS 27 VIOLET (SUTURE) ×2
SUT VIC AB 1 CT1 27 (SUTURE) ×6
SUT VIC AB 1 CT1 27XBRD ANTBC (SUTURE) ×3 IMPLANT
SUT VIC AB 1 CTX 36 (SUTURE)
SUT VIC AB 1 CTX36XBRD ANBCTR (SUTURE) IMPLANT
SUT VIC AB 2-0 CT1 27 (SUTURE) ×6
SUT VIC AB 2-0 CT1 TAPERPNT 27 (SUTURE) ×3 IMPLANT
SUTURE STRATFX 0 PDS 27 VIOLET (SUTURE) ×1 IMPLANT
SYR 3ML LL SCALE MARK (SYRINGE) IMPLANT
SYR 50ML LL SCALE MARK (SYRINGE) IMPLANT
TIBIAL BASE ROT PLAT SZ 5 KNEE (Knees) ×2 IMPLANT
TOWER CARTRIDGE SMART MIX (DISPOSABLE) ×2 IMPLANT
TRAY FOLEY MTR SLVR 16FR STAT (SET/KITS/TRAYS/PACK) ×2 IMPLANT
WATER STERILE IRR 1000ML POUR (IV SOLUTION) ×2 IMPLANT
WIPE CHG CHLORHEXIDINE 2% (PERSONAL CARE ITEMS) ×2 IMPLANT
WRAP KNEE MAXI GEL POST OP (GAUZE/BANDAGES/DRESSINGS) ×2 IMPLANT

## 2020-03-16 NOTE — Interval H&P Note (Signed)
History and Physical Interval Note:  03/16/2020 1:00 PM  [name]  has presented today for surgery, with the diagnosis of RIGHT KNEE DEGENERATIVE JOINT DISEASE.  The various methods of treatment have been discussed with the patient and family. After consideration of risks, benefits and other options for treatment, the patient has consented to  Procedure(s) with comments: TOTAL KNEE ARTHROPLASTY (Right) - 2.5 hrs as a surgical intervention.  The patient's history has been reviewed, patient examined, no change in status, stable for surgery.  I have reviewed the patient's chart and labs.  Questions were answered to the patient's satisfaction.     [name]

## 2020-03-16 NOTE — Interval H&P Note (Signed)
History and Physical Interval Note:  03/16/2020 12:59 PM  [name]  has presented today for surgery, with the diagnosis of RIGHT KNEE DEGENERATIVE JOINT DISEASE.  The various methods of treatment have been discussed with the patient and family. After consideration of risks, benefits and other options for treatment, the patient has consented to  Procedure(s) with comments: TOTAL KNEE ARTHROPLASTY (Right) - 2.5 hrs as a surgical intervention.  The patient's history has been reviewed, patient examined, no change in status, stable for surgery.  I have reviewed the patient's chart and labs.  Questions were answered to the patient's satisfaction.     Johnn Hai

## 2020-03-16 NOTE — Anesthesia Procedure Notes (Signed)
Spinal  Patient location during procedure: OR End time: 03/16/2020 2:15 PM Staffing Performed: resident/CRNA  Resident/CRNA: Lissa Morales, CRNA Preanesthetic Checklist Completed: patient identified, IV checked, site marked, risks and benefits discussed, surgical consent, monitors and equipment checked, pre-op evaluation and timeout performed Spinal Block Patient position: sitting Prep: DuraPrep Patient monitoring: heart rate, continuous pulse ox and blood pressure Approach: midline Location: L3-4 Injection technique: single-shot Needle Needle type: Quincke  Needle gauge: 22 G Needle length: 9 cm Additional Notes Expiration date of kit checked and confirmed. Patient tolerated procedure well, without complications.

## 2020-03-16 NOTE — Transfer of Care (Signed)
Immediate Anesthesia Transfer of Care Note  Patient: [name]  Procedure(s) Performed: TOTAL KNEE ARTHROPLASTY (Right Knee)  Patient Location: PACU  Anesthesia Type:Spinal  Level of Consciousness: awake, alert , oriented and patient cooperative  Airway & Oxygen Therapy: Patient Spontanous Breathing and Patient connected to face mask oxygen  Post-op Assessment: Report given to RN and Post -op Vital signs reviewed and stable  Post vital signs: stable  Last Vitals:  Vitals Value Taken Time  BP    Temp    Pulse    Resp    SpO2      Last Pain:  Vitals:   03/16/20 1350  TempSrc:   PainSc: 0-No pain         Complications: No complications documented.

## 2020-03-16 NOTE — Progress Notes (Signed)
Assisted Dr. Hatchett with right, ultrasound guided, adductor canal block. Side rails up, monitors on throughout procedure. See vital signs in flow sheet. Tolerated Procedure well.  

## 2020-03-16 NOTE — Progress Notes (Signed)
Patient has throat congestion and loss of voice with talking. Afebrile. Has quarentined since covid test. No body aches or any other symptoms. Dr Jillyn Hidden made aware. To proceed with getting patient ready for surgery.

## 2020-03-16 NOTE — Anesthesia Procedure Notes (Addendum)
Anesthesia Regional Block: Adductor canal block   Pre-Anesthetic Checklist: ,, timeout performed, Correct Patient, Correct Site, Correct Laterality, Correct Procedure, Correct Position, site marked, Risks and benefits discussed,  Surgical consent,  Pre-op evaluation,  At surgeon's request and post-op pain management  Laterality: Lower and Right  Prep: chloraprep       Needles:  Injection technique: Single-shot  Needle Type: Echogenic Stimulator Needle     Needle Length: 9cm  Needle Gauge: 20   Needle insertion depth: 2.5 cm   Additional Needles:   Procedures:,,,, ultrasound used (permanent image in chart),,,,  Narrative:  Start time: 03/16/2020 12:55 PM End time: 03/16/2020 1:05 PM Injection made incrementally with aspirations every 5 mL.  Performed by: Personally  Anesthesiologist: Lyn Hollingshead, MD

## 2020-03-16 NOTE — Interval H&P Note (Signed)
History and Physical Interval Note:  03/16/2020 1:00 PM  [name]  has presented today for surgery, with the diagnosis of RIGHT KNEE DEGENERATIVE JOINT DISEASE.  The various methods of treatment have been discussed with the patient and family. After consideration of risks, benefits and other options for treatment, the patient has consented to  Procedure(s) with comments: TOTAL KNEE ARTHROPLASTY (Right) - 2.5 hrs as a surgical intervention.  The patient's history has been reviewed, patient examined, no change in status, stable for surgery.  I have reviewed the patient's chart and labs.  Questions were answered to the patient's satisfaction.     Johnn Hai

## 2020-03-16 NOTE — Interval H&P Note (Signed)
History and Physical Interval Note:  03/16/2020 1:01 PM  [name]  has presented today for surgery, with the diagnosis of RIGHT KNEE DEGENERATIVE JOINT DISEASE.  The various methods of treatment have been discussed with the patient and family. After consideration of risks, benefits and other options for treatment, the patient has consented to  Procedure(s) with comments: TOTAL KNEE ARTHROPLASTY (Right) - 2.5 hrs as a surgical intervention.  The patient's history has been reviewed, patient examined, no change in status, stable for surgery.  I have reviewed the patient's chart and labs.  Questions were answered to the patient's satisfaction.     Johnn Hai

## 2020-03-16 NOTE — Anesthesia Preprocedure Evaluation (Addendum)
Anesthesia Evaluation  Patient identified by MRN, date of birth, ID band Patient awake    Reviewed: Allergy & Precautions, NPO status , Patient's Chart, lab work & pertinent test results, reviewed documented beta blocker date and time   Airway Mallampati: II  TM Distance: >3 FB Neck ROM: Full    Dental no notable dental hx. (+) Teeth Intact   Pulmonary    Pulmonary exam normal breath sounds clear to auscultation       Cardiovascular hypertension, Pt. on medications and Pt. on home beta blockers + CAD and +CHF  Normal cardiovascular exam Rhythm:Regular Rate:Normal     Neuro/Psych    GI/Hepatic GERD  Medicated,  Endo/Other    Renal/GU Renal InsufficiencyRenal disease  negative genitourinary   Musculoskeletal  (+) Arthritis , Osteoarthritis,    Abdominal Normal abdominal exam  (+)   Peds  Hematology  (+) Blood dyscrasia, anemia ,   Anesthesia Other Findings  1. Mild global hypokinesis worse in the septum.. Left ventricular  ejection fraction, by estimation, is 45 to 50%. The left ventricle has  mildly decreased function. The left ventricle demonstrates global  hypokinesis. The left ventricular internal cavity  size was mildly dilated. There is mild asymmetric left ventricular  hypertrophy. Left ventricular diastolic parameters are consistent with  Grade I diastolic dysfunction (impaired relaxation). Elevated left  ventricular end-diastolic pressure.  2. Right ventricular systolic function is normal. The right ventricular  size is normal. There is normal pulmonary artery systolic pressure.  3. Left atrial size was moderately dilated.  4. The mitral valve is normal in structure and function. Mild mitral  valve regurgitation. No evidence of mitral stenosis.  5. The aortic valve is tricuspid. Aortic valve regurgitation is trivial.  No aortic stenosis is present.  6. The inferior vena cava is normal in size with  greater than 50%  respiratory variability, suggesting right atrial pressure of 3 mmHg.   Prox LAD lesion is 45% stenosed.   Mild coronary obstructive disease with a smooth area of 40 to 50% proximal LAD stenosis in the region of a large first diagonal vessel.  The remainder of the coronary arteries are angiographically normal including a large ramus intermediate vessel, left circumflex vessel, and dominant RCA.  Mild right heart pressure elevation with PA systolic pressure at 37 and mean pressure 25 mmHg.  LVEDP: 25 mmHg  RECOMMENDATION: Medical therapy for mild CAD involving her proximal LAD with guideline directed medical therapy for LV dysfunction.    Reproductive/Obstetrics                          Anesthesia Physical Anesthesia Plan  ASA: III  Anesthesia Plan: Spinal   Post-op Pain Management:  Regional for Post-op pain   Induction:   PONV Risk Score and Plan: 2 and Ondansetron, Dexamethasone and Midazolam  Airway Management Planned: Natural Airway, Simple Face Mask and Nasal Cannula  Additional Equipment: None  Intra-op Plan:   Post-operative Plan:   Informed Consent: I have reviewed the patients History and Physical, chart, labs and discussed the procedure including the risks, benefits and alternatives for the proposed anesthesia with the patient or authorized representative who has indicated his/her understanding and acceptance.       Plan Discussed with: CRNA  Anesthesia Plan Comments:        Anesthesia Quick Evaluation

## 2020-03-16 NOTE — Anesthesia Procedure Notes (Signed)
Procedure Name: MAC Date/Time: 03/16/2020 2:06 PM Performed by: Lissa Morales, CRNA Pre-anesthesia Checklist: Patient identified, Emergency Drugs available, Suction available, Patient being monitored and Timeout performed Patient Re-evaluated:Patient Re-evaluated prior to induction Oxygen Delivery Method: Simple face mask Placement Confirmation: positive ETCO2

## 2020-03-16 NOTE — Brief Op Note (Signed)
03/16/2020  4:00 PM  PATIENT:  [name]  79 y.o. female  PRE-OPERATIVE DIAGNOSIS:  RIGHT KNEE DEGENERATIVE JOINT DISEASE  POST-OPERATIVE DIAGNOSIS:  RIGHT KNEE DEGENERATIVE JOINT DISEASE  PROCEDURE:  Procedure(s) with comments: TOTAL KNEE ARTHROPLASTY (Right) - 2.5 hrs  SURGEON:  Surgeon(s) and Role:    Susa Day, MD - Primary  PHYSICIAN ASSISTANT:   ASSISTANTS: [place]   ANESTHESIA:   spinal  EBL:  50 mL   BLOOD ADMINISTERED:none  DRAINS: none   LOCAL MEDICATIONS USED:  MARCAINE     SPECIMEN:  No Specimen  DISPOSITION OF SPECIMEN:  N/A  COUNTS:  YES  TOURNIQUET:   Total Tourniquet Time Documented: Thigh (Right) - 59 minutes Total: Thigh (Right) - 59 minutes   DICTATION: .Other Dictation: Dictation Number  [id number]  PLAN OF CARE: Admit to inpatient   PATIENT DISPOSITION:  PACU - hemodynamically stable.   Delay start of Pharmacological VTE agent (>24hrs) due to surgical blood loss or risk of bleeding: yes

## 2020-03-17 LAB — CBC
HCT: 30.7 % — ABNORMAL LOW (ref 36.0–46.0)
Hemoglobin: 9.6 g/dL — ABNORMAL LOW (ref 12.0–15.0)
MCH: 29.4 pg (ref 26.0–34.0)
MCHC: 31.3 g/dL (ref 30.0–36.0)
MCV: 93.9 fL (ref 80.0–100.0)
Platelets: 170 10*3/uL (ref 150–400)
RBC: 3.27 MIL/uL — ABNORMAL LOW (ref 3.87–5.11)
RDW: 14.5 % (ref 11.5–15.5)
WBC: 7.9 10*3/uL (ref 4.0–10.5)
nRBC: 0 % (ref 0.0–0.2)

## 2020-03-17 LAB — BASIC METABOLIC PANEL
Anion gap: 7 (ref 5–15)
BUN: 31 mg/dL — ABNORMAL HIGH (ref 8–23)
CO2: 28 mmol/L (ref 22–32)
Calcium: 8.5 mg/dL — ABNORMAL LOW (ref 8.9–10.3)
Chloride: 99 mmol/L (ref 98–111)
Creatinine, Ser: 1.13 mg/dL — ABNORMAL HIGH (ref 0.44–1.00)
GFR, Estimated: 49 mL/min — ABNORMAL LOW (ref >60)
Glucose, Bld: 222 mg/dL — ABNORMAL HIGH (ref 70–99)
Potassium: 4.8 mmol/L (ref 3.5–5.1)
Sodium: 134 mmol/L — ABNORMAL LOW (ref 135–145)

## 2020-03-17 NOTE — Progress Notes (Signed)
Physical Therapy Treatment Patient Details Name: [name] MRN: [id number] DOB: 1941/04/19 Today's Date: 03/17/2020    History of Present Illness 79 yo female s/p R TKR on 11/19. PMH includes HF, OA, HTN, PE, RLS, CVA with no residual deficits, lumbar surgery with residual L foot drop.    PT Comments    Pt ambulating good hallway distance with use of RW this session, and proficiently navigated steps with min sequencing cues from PT. Pt's husband present for session, and understands how to assist wife with mobility. Pt completed TKR exercises, handout reviewed and administered. Pt and husband with no further questions, appropriate to d/c home from PT standpoint.     Follow Up Recommendations  Follow surgeon's recommendation for DC plan and follow-up therapies;Home health PT;Supervision for mobility/OOB     Equipment Recommendations  None recommended by PT    Recommendations for Other Services       Precautions / Restrictions Precautions Precautions: Fall Restrictions Weight Bearing Restrictions: No    Mobility  Bed Mobility Overal bed mobility: Needs Assistance Bed Mobility: Supine to Sit;Sit to Supine     Supine to sit: Supervision Sit to supine: Min assist   General bed mobility comments: supervision for supine>sit for increased time and use of bedrails, min assist for return to supine for LE lifting into bed.  Transfers Overall transfer level: Needs assistance Equipment used: Rolling walker (2 wheeled) Transfers: Sit to/from Stand Sit to Stand: Min guard         General transfer comment: for safety, STS x3 from recliner x2 and from EOB x1.  Ambulation/Gait Ambulation/Gait assistance: Supervision Gait Distance (Feet): 100 Feet Assistive device: Rolling walker (2 wheeled) Gait Pattern/deviations: Step-to pattern;Trunk flexed;Antalgic;Step-through pattern;Decreased stride length;Decreased step length - right Gait velocity: decr   General Gait Details:  supervision for safety, verbal cue x1 for placement in RW, upright posture.   Stairs Stairs: Yes Stairs assistance: Min guard Stair Management: One rail Left;Step to pattern;Sideways Number of Stairs: 3 (x2) General stair comments: min guard for safety, verbal cuing for facing rail for bilateral UE support, sequencing (up with good leg, down with bad leg leading), and step-to sequence.   Wheelchair Mobility    Modified Rankin (Stroke Patients Only)       Balance Overall balance assessment: Needs assistance;History of Falls Sitting-balance support: No upper extremity supported Sitting balance-Leahy Scale: Good     Standing balance support: Bilateral upper extremity supported Standing balance-Leahy Scale: Poor Standing balance comment: reliant on external support                            Cognition Arousal/Alertness: Awake/alert Behavior During Therapy: WFL for tasks assessed/performed Overall Cognitive Status: Within Functional Limits for tasks assessed                                        Exercises Total Joint Exercises Ankle Circles/Pumps: AROM;Right;5 reps;Supine Quad Sets: AROM;Right;5 reps;Supine Short Arc Quad: AAROM;Right;5 reps;Seated Heel Slides: AAROM;Right;5 reps;Supine Hip ABduction/ADduction: AAROM;Right;5 reps;Supine Straight Leg Raises: AAROM;Right;5 reps;Supine Goniometric ROM: knee aarom ~5-90*, limited by pain    General Comments        Pertinent Vitals/Pain Pain Assessment: 0-10 Pain Score: 4  Pain Location: R knee Pain Descriptors / Indicators: Sore;Discomfort Pain Intervention(s): Limited activity within patient's tolerance;Monitored during session;Repositioned    Home Living Family/patient expects to  be discharged to:: Private residence Living Arrangements: Spouse/significant other Available Help at Discharge: Family;Available 24 hours/day Type of Home: House Home Access: Stairs to enter Entrance  Stairs-Rails: Right Home Layout: One level Home Equipment: Walker - 4 wheels;Walker - 2 wheels;Bedside commode;Toilet riser      Prior Function Level of Independence: Independent with assistive device(s)      Comments: pt using rollator for ambulation PTA, independent in ADLs. L foot drop due to previous back surgery, states she has not had a fall in 1 year.   PT Goals (current goals can now be found in the care plan section) Acute Rehab PT Goals Patient Stated Goal: go home PT Goal Formulation: With patient Time For Goal Achievement: 03/24/20 Potential to Achieve Goals: Good Progress towards PT goals: Progressing toward goals    Frequency    7X/week      PT Plan Current plan remains appropriate    Co-evaluation              AM-PAC PT "6 Clicks" Mobility   Outcome Measure  Help needed turning from your back to your side while in a flat bed without using bedrails?: A Little Help needed moving from lying on your back to sitting on the side of a flat bed without using bedrails?: A Little Help needed moving to and from a bed to a chair (including a wheelchair)?: A Little Help needed standing up from a chair using your arms (e.g., wheelchair or bedside chair)?: A Little Help needed to walk in hospital room?: A Little Help needed climbing 3-5 steps with a railing? : A Little 6 Click Score: 18    End of Session   Activity Tolerance: Patient tolerated treatment well Patient left: with call bell/phone within reach;in bed;with family/visitor present Nurse Communication: Mobility status PT Visit Diagnosis: Other abnormalities of gait and mobility (R26.89);Difficulty in walking, not elsewhere classified (R26.2)     Time: 0350-0938 PT Time Calculation (min) (ACUTE ONLY): 35 min  Charges:  $Gait Training: 8-22 mins $Therapeutic Exercise: 8-22 mins                    Jaymeson Mengel E, PT Acute Rehabilitation Services Pager [phone]  Office [phone]   Roxine Caddy D  Elonda Husky 03/17/2020, 2:28 PM

## 2020-03-17 NOTE — Progress Notes (Signed)
Subjective: 1 Day Post-Op Procedure(s) (LRB): TOTAL KNEE ARTHROPLASTY (Right) Patient reports pain as mild.   Patient seen in rounds for Dr. Tonita Cong. Patient is well, and has had no acute complaints or problems other than discomfort in the right knee. Patient was about to begin working with PT on exam this AM. She reports she is hoping to go home today. Voiding without difficulty, positive flatus. Denies CP, SHOB, N/V.  We will continue therapy today.   Objective: Vital signs in last 24 hours: Temp:  [97.5 F (36.4 C)-97.9 F (36.6 C)] 97.8 F (36.6 C) (11/20 0623) Pulse Rate:  [49-70] 49 (11/20 0623) Resp:  [14-20] 16 (11/20 0623) BP: (116-153)/(52-71) 136/65 (11/20 0623) SpO2:  [86 %-100 %] 96 % (11/20 0800) Weight:  [77.1 kg] 77.1 kg (11/19 1054)  Intake/Output from previous day:  Intake/Output Summary (Last 24 hours) at 03/17/2020 0912 Last data filed at 03/17/2020 0600 Gross per 24 hour  Intake 2985.72 ml  Output 900 ml  Net 2085.72 ml     Intake/Output this shift: No intake/output data recorded.  Labs: Recent Labs    03/17/20 0354  HGB 9.6*   Recent Labs    03/17/20 0354  WBC 7.9  RBC 3.27*  HCT 30.7*  PLT 170   Recent Labs    03/17/20 0354  NA 134*  K 4.8  CL 99  CO2 28  BUN 31*  CREATININE 1.13*  GLUCOSE 222*  CALCIUM 8.5*   No results for input(s): LABPT, INR in the last 72 hours.  Exam: General - Patient is Alert and Oriented Extremity - Neurologically intact Sensation intact distally Intact pulses distally Dorsiflexion/Plantar flexion intact Dressing - dressing C/D/I Motor Function - intact, moving foot and toes well on exam.   Past Medical History:  Diagnosis Date  . Anemia   . Arthritis    osteoarthritis Shoulder,neck  . Bell's palsy 1980s  . CHF (congestive heart failure) ([place]) 06/2019  . Chronic kidney disease    Stage III  . Dyspnea 06/2019   On exertion  . GERD (gastroesophageal reflux disease)   . Hemochromatosis     . History of hiatal hernia   . Hypertension   . Iron deficiency anemia due to chronic blood loss 03/10/2017  . PE (pulmonary embolism) 2007   x2  . Peripheral vascular disease ([facility])    DVT Rt leg  . Pneumonia 1990s   "walking" pneumonia  . Prothrombin gene mutation ([place]) 05/30/2013  . Restless legs   . Right leg DVT ([place]) 05/30/2013  . Stroke [facility])    found on a MRI, she's not aware otherwise    Assessment/Plan: 1 Day Post-Op Procedure(s) (LRB): TOTAL KNEE ARTHROPLASTY (Right) Active Problems:   S/P TKR (total knee replacement) using cement  Estimated body mass index is 30.11 kg/m as calculated from the following:   Height as of this encounter: 5\' 3"  (1.6 m).   Weight as of this encounter: 77.1 kg. Advance diet Up with therapy D/C IV fluids   Patient's anticipated LOS is less than 2 midnights, meeting these requirements: - Lives within 1 hour of care - Has a competent adult at home to recover with post-op recover - NO history of  - Chronic pain requiring opiods  - Diabetes  - Coronary Artery Disease  - Heart failure  - Heart attack  - Stroke  - DVT/VTE  - Cardiac arrhythmia  - Respiratory Failure/COPD  - Renal failure  - Anemia  - Advanced Liver disease  DVT Prophylaxis - Eliquis Weight bearing as tolerated.  Plan is to go Home after hospital stay. Plan for discharge today following 1-2 sessions of therapy as long as she is meeting her goals. Scheduled for OPPT. Follow up in the office in 2 weeks.   Griffith Citron, PA-C Orthopedic Surgery [phone] 03/17/2020, 9:12 AM

## 2020-03-17 NOTE — TOC Progression Note (Signed)
Transition of Care [facility]) - Progression Note    Patient Details  Name: WILFRED SIVERSON MRN: [id number] Date of Birth: 1941/01/23  Transition of Care [facility]) CM/SW Contact  Joaquin Courts, RN Phone Number: 03/17/2020, 10:14 AM  Clinical Narrative:    Patient reports she has rolling walker and 3in1 at home.  Patient scheduled for OPPT services.   Expected Discharge Plan: Home/Self Care Barriers to Discharge: No Barriers Identified  Expected Discharge Plan and Services Expected Discharge Plan: Home/Self Care   Discharge Planning Services: CM Consult   Living arrangements for the past 2 months: Single Family Home Expected Discharge Date: 03/17/20               DME Arranged: N/A DME Agency: NA       HH Arranged: NA           Social Determinants of Health (SDOH) Interventions    Readmission Risk Interventions No flowsheet data found.

## 2020-03-17 NOTE — Discharge Instructions (Signed)
Elevate leg above heart 6x a day for 20minutes each Use knee immobilizer while walking until can SLR x 10 Use knee immobilizer in bed to keep knee in extension Aquacel dressing may remain in place until follow up. May shower with aquacel dressing in place. If the dressing becomes saturated or peels off, you may remove aquacel dressing. Do not remove steri-strips if they are present. Place new dressing with gauze and tape or ACE bandage which should be kept clean and dry and changed daily.  INSTRUCTIONS AFTER JOINT REPLACEMENT   o Remove items at home which could result in a fall. This includes throw rugs or furniture in walking pathways o ICE to the affected joint every three hours while awake for 30 minutes at a time, for at least the first 3-5 days, and then as needed for pain and swelling.  Continue to use ice for pain and swelling. You may notice swelling that will progress down to the foot and ankle.  This is normal after surgery.  Elevate your leg when you are not up walking on it.   o Continue to use the breathing machine you got in the hospital (incentive spirometer) which will help keep your temperature down.  It is common for your temperature to cycle up and down following surgery, especially at night when you are not up moving around and exerting yourself.  The breathing machine keeps your lungs expanded and your temperature down.   DIET:  As you were doing prior to hospitalization, we recommend a well-balanced diet.  DRESSING / WOUND CARE / SHOWERING  Keep the surgical dressing until follow up.  The dressing is water proof, so you can shower without any extra covering.  IF THE DRESSING FALLS OFF or the wound gets wet inside, change the dressing with sterile gauze.  Please use good hand washing techniques before changing the dressing.  Do not use any lotions or creams on the incision until instructed by your surgeon.    ACTIVITY  o Increase activity slowly as tolerated, but follow the  weight bearing instructions below.   o No driving for 6 weeks or until further direction given by your physician.  You cannot drive while taking narcotics.  o No lifting or carrying greater than 10 lbs. until further directed by your surgeon. o Avoid periods of inactivity such as sitting longer than an hour when not asleep. This helps prevent blood clots.  o You may return to work once you are authorized by your doctor.     WEIGHT BEARING   Weight bearing as tolerated with assist device (walker, cane, etc) as directed, use it as long as suggested by your surgeon or therapist, typically at least 4-6 weeks.   EXERCISES  Results after joint replacement surgery are often greatly improved when you follow the exercise, range of motion and muscle strengthening exercises prescribed by your doctor. Safety measures are also important to protect the joint from further injury. Any time any of these exercises cause you to have increased pain or swelling, decrease what you are doing until you are comfortable again and then slowly increase them. If you have problems or questions, call your caregiver or physical therapist for advice.   Rehabilitation is important following a joint replacement. After just a few days of immobilization, the muscles of the leg can become weakened and shrink (atrophy).  These exercises are designed to build up the tone and strength of the thigh and leg muscles and to improve motion. Often   times heat used for twenty to thirty minutes before working out will loosen up your tissues and help with improving the range of motion but do not use heat for the first two weeks following surgery (sometimes heat can increase post-operative swelling).   These exercises can be done on a training (exercise) mat, on the floor, on a table or on a bed. Use whatever works the best and is most comfortable for you.    Use music or television while you are exercising so that the exercises are a pleasant  break in your day. This will make your life better with the exercises acting as a break in your routine that you can look forward to.   Perform all exercises about fifteen times, three times per day or as directed.  You should exercise both the operative leg and the other leg as well.  Exercises include:   . Quad Sets - Tighten up the muscle on the front of the thigh (Quad) and hold for 5-10 seconds.   . Straight Leg Raises - With your knee straight (if you were given a brace, keep it on), lift the leg to 60 degrees, hold for 3 seconds, and slowly lower the leg.  Perform this exercise against resistance later as your leg gets stronger.  . Leg Slides: Lying on your back, slowly slide your foot toward your buttocks, bending your knee up off the floor (only go as far as is comfortable). Then slowly slide your foot back down until your leg is flat on the floor again.  . Angel Wings: Lying on your back spread your legs to the side as far apart as you can without causing discomfort.  . Hamstring Strength:  Lying on your back, push your heel against the floor with your leg straight by tightening up the muscles of your buttocks.  Repeat, but this time bend your knee to a comfortable angle, and push your heel against the floor.  You may put a pillow under the heel to make it more comfortable if necessary.   A rehabilitation program following joint replacement surgery can speed recovery and prevent re-injury in the future due to weakened muscles. Contact your doctor or a physical therapist for more information on knee rehabilitation.    CONSTIPATION  Constipation is defined medically as fewer than three stools per week and severe constipation as less than one stool per week.  Even if you have a regular bowel pattern at home, your normal regimen is likely to be disrupted due to multiple reasons following surgery.  Combination of anesthesia, postoperative narcotics, change in appetite and fluid intake all can  affect your bowels.   YOU MUST use at least one of the following options; they are listed in order of increasing strength to get the job done.  They are all available over the counter, and you may need to use some, POSSIBLY even all of these options:    Drink plenty of fluids (prune juice may be helpful) and high fiber foods Colace 100 mg by mouth twice a day  Senokot for constipation as directed and as needed Dulcolax (bisacodyl), take with full glass of water  Miralax (polyethylene glycol) once or twice a day as needed.  If you have tried all these things and are unable to have a bowel movement in the first 3-4 days after surgery call either your surgeon or your primary doctor.    If you experience loose stools or diarrhea, hold the medications until you stool   forms back up.  If your symptoms do not get better within 1 week or if they get worse, check with your doctor.  If you experience "the worst abdominal pain ever" or develop nausea or vomiting, please contact the office immediately for further recommendations for treatment.   ITCHING:  If you experience itching with your medications, try taking only a single pain pill, or even half a pain pill at a time.  You can also use Benadryl over the counter for itching or also to help with sleep.   TED HOSE STOCKINGS:  Use stockings on both legs until for at least 2 weeks or as directed by physician office. They may be removed at night for sleeping.  MEDICATIONS:  See your medication summary on the "After Visit Summary" that nursing will review with you.  You may have some home medications which will be placed on hold until you complete the course of blood thinner medication.  It is important for you to complete the blood thinner medication as prescribed.  PRECAUTIONS:  If you experience chest pain or shortness of breath - call 911 immediately for transfer to the hospital emergency department.   If you develop a fever greater that 101 F, purulent  drainage from wound, increased redness or drainage from wound, foul odor from the wound/dressing, or calf pain - CONTACT YOUR SURGEON.                                                   FOLLOW-UP APPOINTMENTS:  If you do not already have a post-op appointment, please call the office for an appointment to be seen by your surgeon.  Guidelines for how soon to be seen are listed in your "After Visit Summary", but are typically between 1-4 weeks after surgery.  OTHER INSTRUCTIONS:   Knee Replacement:  Do not place pillow under knee, focus on keeping the knee straight while resting. CPM instructions: 0-90 degrees, 2 hours in the morning, 2 hours in the afternoon, and 2 hours in the evening. Place foam block, curve side up under heel at all times except when in CPM or when walking.  DO NOT modify, tear, cut, or change the foam block in any way.   DENTAL ANTIBIOTICS:  In most cases prophylactic antibiotics for Dental procdeures after total joint surgery are not necessary.  Exceptions are as follows:  1. History of prior total joint infection  2. Severely immunocompromised (Organ Transplant, cancer chemotherapy, Rheumatoid biologic meds such as Foot of Ten)  3. Poorly controlled diabetes (A1C &gt; 8.0, blood glucose over 200)  If you have one of these conditions, contact your surgeon for an antibiotic prescription, prior to your dental procedure.   MAKE SURE YOU:  . Understand these instructions.  . Get help right away if you are not doing well or get worse.    Thank you for letting us be a part of your medical care team.  It is a privilege we respect greatly.  We hope these instructions will help you stay on track for a fast and full recovery!    Information on my medicine - ELIQUIS (apixaban)  Why was Eliquis prescribed for you? Eliquis was prescribed for you to reduce the risk of blood clots forming after orthopedic surgery.    What do You need to know about Eliquis? Take your  Eliquis TWICE DAILY -  one tablet in the morning and one tablet in the evening with or without food.  It would be best to take the dose about the same time each day.  If you have difficulty swallowing the tablet whole please discuss with your pharmacist how to take the medication safely.  Take Eliquis exactly as prescribed by your doctor and DO NOT stop taking Eliquis without talking to the doctor who prescribed the medication.  Stopping without other medication to take the place of Eliquis may increase your risk of developing a clot.  After discharge, you should have regular check-up appointments with your healthcare provider that is prescribing your Eliquis.  What do you do if you miss a dose? If a dose of ELIQUIS is not taken at the scheduled time, take it as soon as possible on the same day and twice-daily administration should be resumed.  The dose should not be doubled to make up for a missed dose.  Do not take more than one tablet of ELIQUIS at the same time.  Important Safety Information A possible side effect of Eliquis is bleeding. You should call your healthcare provider right away if you experience any of the following: ? Bleeding from an injury or your nose that does not stop. ? Unusual colored urine (red or dark brown) or unusual colored stools (red or black). ? Unusual bruising for unknown reasons. ? A serious fall or if you hit your head (even if there is no bleeding).  Some medicines may interact with Eliquis and might increase your risk of bleeding or clotting while on Eliquis. To help avoid this, consult your healthcare provider or pharmacist prior to using any new prescription or non-prescription medications, including herbals, vitamins, non-steroidal anti-inflammatory drugs (NSAIDs) and supplements.  Discussed holding Turmeric, and Aspirin while on Apixaban  This website has more information on Eliquis (apixaban): [email or web address]://[email or web address]/home

## 2020-03-17 NOTE — Evaluation (Signed)
Physical Therapy Evaluation Patient Details Name: [name] MRN: [id number] DOB: Feb 03, 1941 Today's Date: 03/17/2020   History of Present Illness  79 yo female s/p R TKR on 11/19. PMH includes HF, OA, HTN, PE, RLS, CVA with no residual deficits, lumbar surgery with residual L foot drop.  Clinical Impression   Pt presents with RLE post-operative pain, RLE post-operative weakness and decreased ROM, increased time and effort to perform mobility tasks, and decreased activity tolerance. Pt to benefit from acute PT to address deficits. Pt ambulated short hallway distance with use of RW and cues for form and safety throughout. Pt educated on TKR exercises to increase circulation and promote ROM, to pt's tolerance and limited by pain. PT to progress mobility as tolerated, and will continue to follow acutely.         Follow Up Recommendations Follow surgeon's recommendation for DC plan and follow-up therapies;Supervision for mobility/OOB    Equipment Recommendations  None recommended by PT    Recommendations for Other Services       Precautions / Restrictions Precautions Precautions: Fall Restrictions Weight Bearing Restrictions: No      Mobility  Bed Mobility Overal bed mobility: Needs Assistance Bed Mobility: Supine to Sit     Supine to sit: Min assist;HOB elevated     General bed mobility comments: Min assist for RLE lifting and translation to EOB    Transfers Overall transfer level: Needs assistance Equipment used: Rolling walker (2 wheeled) Transfers: Sit to/from Stand Sit to Stand: Min guard         General transfer comment: for safety, verbal cuing for hand placement when rising/sitting.  Ambulation/Gait Ambulation/Gait assistance: Min guard Gait Distance (Feet): 45 Feet Assistive device: Rolling walker (2 wheeled) Gait Pattern/deviations: Step-to pattern;Decreased step length - right;Trunk flexed;Antalgic Gait velocity: decr   General Gait Details: Min  gaurd for safety, verbal cuing for upright posture, looking forwards as opposed to down at feet, placement in RW.  Stairs            Wheelchair Mobility    Modified Rankin (Stroke Patients Only)       Balance Overall balance assessment: Needs assistance;History of Falls Sitting-balance support: No upper extremity supported Sitting balance-Leahy Scale: Good     Standing balance support: Bilateral upper extremity supported Standing balance-Leahy Scale: Poor Standing balance comment: reliant on external support                             Pertinent Vitals/Pain Pain Assessment: 0-10 Pain Score: 4  Pain Location: R knee Pain Descriptors / Indicators: Sore;Discomfort Pain Intervention(s): Monitored during session;Limited activity within patient's tolerance;Repositioned    Home Living Family/patient expects to be discharged to:: Private residence Living Arrangements: Spouse/significant other Available Help at Discharge: Family;Available 24 hours/day Type of Home: House Home Access: Stairs to enter Entrance Stairs-Rails: Right Entrance Stairs-Number of Steps: 2 Home Layout: One level Home Equipment: Walker - 4 wheels;Walker - 2 wheels;Bedside commode;Toilet riser      Prior Function Level of Independence: Independent with assistive device(s)         Comments: pt using rollator for ambulation PTA, independent in ADLs. L foot drop due to previous back surgery, states she has not had a fall in 1 year.     Hand Dominance   Dominant Hand: Right    Extremity/Trunk Assessment   Upper Extremity Assessment Upper Extremity Assessment: Overall WFL for tasks assessed;RUE deficits/detail RUE Deficits / Details: post-operative  weakness; able to perform full ROM ankle pump, knee flexion to 90*, quad set, SLR with 0* quad lag RUE Sensation: WNL    Lower Extremity Assessment Lower Extremity Assessment: LLE deficits/detail LLE Deficits / Details: L foot drop,  chronic    Cervical / Trunk Assessment Cervical / Trunk Assessment: Normal  Communication   Communication: No difficulties  Cognition Arousal/Alertness: Awake/alert Behavior During Therapy: WFL for tasks assessed/performed Overall Cognitive Status: Within Functional Limits for tasks assessed                                        General Comments      Exercises Total Joint Exercises Ankle Circles/Pumps: AROM;Right;5 reps;Supine Quad Sets: AROM;Right;5 reps;Supine Heel Slides: AAROM;Right;5 reps;Supine Goniometric ROM: knee aarom ~5-90*, limited by pain and stiffness   Assessment/Plan    PT Assessment Patient needs continued PT services  PT Problem List Decreased strength;Decreased mobility;Decreased range of motion;Decreased activity tolerance;Decreased balance;Decreased knowledge of use of DME;Pain       PT Treatment Interventions DME instruction;Therapeutic activities;Gait training;Therapeutic exercise;Patient/family education;Balance training;Stair training;Functional mobility training;Neuromuscular re-education    PT Goals (Current goals can be found in the Care Plan section)  Acute Rehab PT Goals Patient Stated Goal: go home PT Goal Formulation: With patient Time For Goal Achievement: 03/24/20 Potential to Achieve Goals: Good    Frequency 7X/week   Barriers to discharge        Co-evaluation               AM-PAC PT "6 Clicks" Mobility  Outcome Measure Help needed turning from your back to your side while in a flat bed without using bedrails?: A Little Help needed moving from lying on your back to sitting on the side of a flat bed without using bedrails?: A Little Help needed moving to and from a bed to a chair (including a wheelchair)?: A Little Help needed standing up from a chair using your arms (e.g., wheelchair or bedside chair)?: A Little Help needed to walk in hospital room?: A Little Help needed climbing 3-5 steps with a railing?  : A Lot 6 Click Score: 17    End of Session   Activity Tolerance: Patient tolerated treatment well Patient left: in chair;with call bell/phone within reach (pt verbalizes she will not get up without staff assist) Nurse Communication: Mobility status PT Visit Diagnosis: Other abnormalities of gait and mobility (R26.89);Difficulty in walking, not elsewhere classified (R26.2)    Time: 4268-3419 PT Time Calculation (min) (ACUTE ONLY): 25 min   Charges:   PT Evaluation $PT Eval Low Complexity: 1 Low PT Treatments $Gait Training: 8-22 mins       Chatham Howington E, PT Acute Rehabilitation Services Pager [phone]  Office [phone]   Jkai Arwood D Gregoire Bennis 03/17/2020, 11:13 AM

## 2020-03-17 NOTE — Op Note (Signed)
NAME: ZAN, TRISKA MEDICAL RECORD [id number] ACCOUNT [id number] DATE OF BIRTH:Jun 20, 1940 FACILITY: [facility] LOCATION: [id number] PHYSICIAN:Murrel Freet Windy Kalata, MD  OPERATIVE REPORT  DATE OF PROCEDURE:  03/16/2020  PREOPERATIVE DIAGNOSIS:  End-stage osteoarthritis of the right knee.  POSTOPERATIVE DIAGNOSIS:  End-stage osteoarthritis of the right knee.  PROCEDURE PERFORMED:  Right total knee arthroplasty utilizing DePuy rotating platform, 5 femur, 5 tibia, 6 mm insert, 35 patella.  ANESTHESIA:  Spinal.  ASSISTANT:  Lacie Draft, PA  HISTORY:  A 79 year old with end-stage osteoarthrosis of the medial compartment indicated for replacement of the degenerated joint.  Risks and benefits discussed including bleeding, infection, damage to neurovascular structures, no change in symptoms,  worsening symptoms, DVT, PE, anesthetic complications, etc.  The patient had a preoperative Greenfield filter placed due to history of DVT and PE.  TECHNIQUE:  With the patient in supine position, and after the induction of adequate spinal anesthesia, 2 grams Kefzol and 100 mg gentamicin due to HISTORY OF ANAPHYLAXIS WITH PENICILLIN, the right lower extremity was prepped, draped, and exsanguinated in the  usual sterile fashion.  Thigh tourniquet inflated to 250 mmHg.  Knee was slightly flexed.  Midline incision was made over the patella.  Full thickness flaps developed.  Median parapatellar arthrotomy performed, soft tissue envelope medially, preserving  the MCL.  Patella everted, knee flexed.  Tricompartmental osteoarthrosis was noted.  Bone-on-bone medial compartment.  Leksell rongeur utilized to remove osteophytes.  Remnants of medial and lateral meniscus and ACL were resected as well.  A notch was  placed above the notch with a Leksell rongeur.  Step drill was utilized to enter the femoral canal in line with the femur.  This was then subsequently irrigated, confirmed with a T-handle reamer.  An intramedullary  guide placed with a 5-degree right 9 mm  off the distal femur.  This was then pinned.  Distal femoral cut was then performed.  I then used a femoral sizer on the distal femur.  Based off the anterior cortex, this was a 5 with 3 degrees of external rotation, which was pinned.  I then put a  distal femoral cutting jig on those pins.  Anterior, posterior and chamfer cuts were performed with soft tissues protected at all times with curved Crego.  Next, attention was turned towards the tibia, which was subluxed with a McHale retractor.   External alignment guide was utilized based off the medial compartment, 2 mm off the posteromedial side, parallel to the shaft, bisecting the tibiotalar joint with a 3-degree slope.  This was pinned.  I performed a tibial cut, protecting the popliteus  posteriorly.  Next, I trialed our extension gap with a 5 mm insert.  It was stable with slight recurvatum.  Knee was then subsequently flexed.  Attention turned back towards the femur.  We measured our box cut bisecting the canal.  This was then performed and the  box cut was planed.  Next, I subluxed the tibia, measured it to a 5.  Just the medial aspect of the tibial tubercle with maximum contained coverage.  I then harvested bone from the central tibia and placed in the femoral canal.  I then used a central reamer  followed by a punch guide.  I then turned attention back to the femur.  A trial femur was then placed.  I drilled the lug holes.  This was 5 .  I used a 6 mm insert, reduced it and had full extension, full flexion, good stability to varus and valgus  stress at 0 and 30 degrees.  Negative anterior drawer.  Attention turned towards preparing the patella, which was everted, measured to a 24 and planed to a 15.  Utilizing the external patellar jig.  I used a trial paddle, which was parallel to the joint  surface.  Drilled the peg holes.  I placed a trial patellar component,  reduced it and had excellent patellofemoral  tracking.  I removed all implants.  Checked posteriorly, removed any remnants of the menisci, popliteus and capsule was intact, cauterized  the geniculates.  I then everted the patella, knee flexed, subluxed, all surfaces thoroughly dried.  Mixed cement on the back table in the appropriate fashion.  I then injected cement into the tibial canal, digitally pressurizing it.  I then placed  cement on the permanent 5 tibial tray and impacted it onto the tibia.  Redundant cement removed.  I cemented and impacted the femur as well with cement on the femur as well as the femoral condyles.  Redundant cement removed.  I placed a trial 6 mm  insert, reduced it, held it with axial load throughout the curing of the cement in extension.  Redundant cement removed.  I then cemented and clamped the patella as well.  Redundant cement meticulously removed.  I placed 0.25% Marcaine with epinephrine  into the joint and covered the joint throughout the curing of the cement.  After curing of the cement, tourniquet was deflated.  Any minor bleeding was cauterized.  This was at 59 minutes.  I then flexed the knee, everted the patella, removed the 6 mm  Insert.All implants were in satisfactory position.  I meticulously removed all redundant cement.  I copiously irrigated with pulsatile lavage and then by antibiotic irrigation.  Subluxed the tibia, placed a 6 mm permanent insert in and I  reduced it then and I had full extension, full flexion,  good stability with varus and valgus stressing at 0 and 30 degrees.  Negative anterior drawer.  Following this, I flexed the knee, reapproximated the patellar arthrotomy with #1 Vicryl interrupted figure-of-eight sutures.  Then oversewn it with a  running StrataFix.  Following this, had excellent patellofemoral tracking and stability.  Flexion to gravity at 90 degrees.  Copiously irrigated the wound, subcutaneous with 2-0 and skin with staples.  The wound was dressed sterilely, placed in   immobilizer, and transferred to the recovery room in satisfactory condition.  The patient tolerated the procedure well.  No complications.  Assistant, Lacie Draft, [place].  Minimal blood loss.  HN/NUANCE  D:03/16/2020 T:03/17/2020 JOB:[id number]

## 2020-03-18 NOTE — Anesthesia Postprocedure Evaluation (Signed)
Anesthesia Post Note  Patient: [name]  Procedure(s) Performed: TOTAL KNEE ARTHROPLASTY (Right Knee)     Patient location during evaluation: Nursing Unit Anesthesia Type: Spinal Level of consciousness: oriented and awake and alert Pain management: pain level controlled Vital Signs Assessment: post-procedure vital signs reviewed and stable Respiratory status: spontaneous breathing and respiratory function stable Cardiovascular status: blood pressure returned to baseline and stable Postop Assessment: no headache, no backache, no apparent nausea or vomiting and patient able to bend at knees Anesthetic complications: no   No complications documented.  Last Vitals:  Vitals:   03/17/20 0800 03/17/20 0923  BP:  134/74  Pulse:  (!) 52  Resp:  18  Temp:  36.8 C  SpO2: 96% 97%    Last Pain:  Vitals:   03/17/20 1152  TempSrc:   PainSc: 6    Pain Goal:                   [place]

## 2020-03-19 ENCOUNTER — Encounter (HOSPITAL_COMMUNITY): Payer: Self-pay | Admitting: Specialist

## 2020-03-20 ENCOUNTER — Other Ambulatory Visit: Payer: Self-pay

## 2020-03-21 NOTE — Discharge Summary (Signed)
Physician Discharge Summary   Patient ID: ILYSSA [name] MRN: [id number] DOB/AGE: 1940-09-11 79 y.o.  Admit date: 03/16/2020 Discharge date: 03/17/2020  Primary Diagnosis: right knee primary osteoarthritis  Admission Diagnoses:  Past Medical History:  Diagnosis Date  . Anemia   . Arthritis    osteoarthritis Shoulder,neck  . Bell's palsy 1980s  . CHF (congestive heart failure) ([place]) 06/2019  . Chronic kidney disease    Stage III  . Dyspnea 06/2019   On exertion  . GERD (gastroesophageal reflux disease)   . Hemochromatosis   . History of hiatal hernia   . Hypertension   . Iron deficiency anemia due to chronic blood loss 03/10/2017  . PE (pulmonary embolism) 2007   x2  . Peripheral vascular disease (HCC)    DVT Rt leg  . Pneumonia 1990s   "walking" pneumonia  . Prothrombin gene mutation ([place]) 05/30/2013  . Restless legs   . Right leg DVT ([place]) 05/30/2013  . Stroke [facility])    found on a MRI, she's not aware otherwise   Discharge Diagnoses:   Active Problems:   S/P TKR (total knee replacement) using cement  Estimated body mass index is 30.11 kg/m as calculated from the following:   Height as of this encounter: 5\' 3"  (1.6 m).   Weight as of this encounter: 77.1 kg.  Procedure:  Procedure(s) (LRB): TOTAL KNEE ARTHROPLASTY (Right)   Consults: None  HPI: see H&P Laboratory Data: Admission on 03/16/2020, Discharged on 03/17/2020  Component Date Value Ref Range Status  . ABO/RH(D) 03/16/2020 O POS   Final  . Antibody Screen 03/16/2020 NEG   Final  . Sample Expiration 03/16/2020    Final                   Value:03/19/2020,2359 Performed at [facility], [place] [address], [place], [place] [address]   . WBC 03/17/2020 7.9  4.0 - 10.5 K/uL Final  . RBC 03/17/2020 3.27* 3.87 - 5.11 MIL/uL Final  . Hemoglobin 03/17/2020 9.6* 12.0 - 15.0 g/dL Final  . HCT 03/17/2020 30.7* 36 - 46 % Final  . MCV 03/17/2020 93.9  80.0 - 100.0 fL Final  . MCH 03/17/2020 29.4   26.0 - 34.0 pg Final  . MCHC 03/17/2020 31.3  30.0 - 36.0 g/dL Final  . RDW 03/17/2020 14.5  11.5 - 15.5 % Final  . Platelets 03/17/2020 170  150 - 400 K/uL Final  . nRBC 03/17/2020 0.0  0.0 - 0.2 % Final   Performed at [facility], [place] [address], [place], [place] [address]  . Sodium 03/17/2020 134* 135 - 145 mmol/L Final  . Potassium 03/17/2020 4.8  3.5 - 5.1 mmol/L Final  . Chloride 03/17/2020 99  98 - 111 mmol/L Final  . CO2 03/17/2020 28  22 - 32 mmol/L Final  . Glucose, Bld 03/17/2020 222* 70 - 99 mg/dL Final   Glucose reference range applies only to samples taken after fasting for at least 8 hours.  . BUN 03/17/2020 31* 8 - 23 mg/dL Final  . Creatinine, Ser 03/17/2020 1.13* 0.44 - 1.00 mg/dL Final  . Calcium 03/17/2020 8.5* 8.9 - 10.3 mg/dL Final  . GFR, Estimated 03/17/2020 49* >60 mL/min Final   Comment: (NOTE) Calculated using the CKD-EPI Creatinine Equation (2021)   . Anion gap 03/17/2020 7  5 - 15 Final   Performed at [facility], [place] [address] [place], [place] [address]  Hospital Outpatient Visit on 03/13/2020  Component Date Value Ref Range Status  . SARS Coronavirus 2 03/13/2020 NEGATIVE  NEGATIVE Final   Comment: (NOTE) SARS-CoV-2 target nucleic acids are NOT DETECTED.  The SARS-CoV-2 RNA is generally detectable in upper and lower respiratory specimens during the acute phase of infection. Negative results do not preclude SARS-CoV-2 infection, do not rule out co-infections with other pathogens, and should not be used as the sole basis for treatment or other patient management decisions. Negative results must be combined with clinical observations, patient history, and epidemiological information. The expected result is Negative.  Fact Sheet for Patients: SugarRoll.be  Fact Sheet for Healthcare Providers: https://www.woods-mathews.com/  This test is not yet approved or cleared by  the [place] FDA and  has been authorized for detection and/or diagnosis of SARS-CoV-2 by FDA under an Emergency Use Authorization (EUA). This EUA will remain  in effect (meaning this test can be used) for the duration of the COVID-19 declaration under Se                          ction 564(b)(1) of the Act, 21 U.S.C. section 360bbb-3(b)(1), unless the authorization is terminated or revoked sooner.  Performed at [facility], [address] [place], [place] [address]   Admission on 03/12/2020, Discharged on 03/12/2020  Component Date Value Ref Range Status  . Sodium 03/12/2020 142  135 - 145 mmol/L Final  . Potassium 03/12/2020 3.5  3.5 - 5.1 mmol/L Final  . Chloride 03/12/2020 102  98 - 111 mmol/L Final  . BUN 03/12/2020 41* 8 - 23 mg/dL Final  . Creatinine, Ser 03/12/2020 1.30* 0.44 - 1.00 mg/dL Final  . Glucose, Bld 03/12/2020 87  70 - 99 mg/dL Final   Glucose reference range applies only to samples taken after fasting for at least 8 hours.  . Calcium, Ion 03/12/2020 1.22  1.15 - 1.40 mmol/L Final  . TCO2 03/12/2020 29  22 - 32 mmol/L Final  . Hemoglobin 03/12/2020 11.2* 12.0 - 15.0 g/dL Final  . HCT 03/12/2020 33.0* 36 - 46 % Final  Hospital Outpatient Visit on 03/10/2020  Component Date Value Ref Range Status  . SARS Coronavirus 2 03/10/2020 NEGATIVE  NEGATIVE Final   Comment: (NOTE) SARS-CoV-2 target nucleic acids are NOT DETECTED.  The SARS-CoV-2 RNA is generally detectable in upper and lower respiratory specimens during the acute phase of infection. Negative results do not preclude SARS-CoV-2 infection, do not rule out co-infections with other pathogens, and should not be used as the sole basis for treatment or other patient management decisions. Negative results must be combined with clinical observations, patient history, and epidemiological information. The expected result is Negative.  Fact Sheet for  Patients: SugarRoll.be  Fact Sheet for Healthcare Providers: https://www.woods-mathews.com/  This test is not yet approved or cleared by the [place] FDA and  has been authorized for detection and/or diagnosis of SARS-CoV-2 by FDA under an Emergency Use Authorization (EUA). This EUA will remain  in effect (meaning this test can be used) for the duration of the COVID-19 declaration under Se                          ction 564(b)(1) of the Act, 21 U.S.C. section 360bbb-3(b)(1), unless the authorization is terminated or revoked sooner.  Performed at [facility], [place] [address] [place], [place] [address]   Hospital Outpatient Visit on 03/09/2020  Component Date Value Ref  Range Status  . Sodium 03/09/2020 138  135 - 145 mmol/L Final  . Potassium 03/09/2020 4.2  3.5 - 5.1 mmol/L Final  . Chloride 03/09/2020 99  98 - 111 mmol/L Final  . CO2 03/09/2020 28  22 - 32 mmol/L Final  . Glucose, Bld 03/09/2020 92  70 - 99 mg/dL Final   Glucose reference range applies only to samples taken after fasting for at least 8 hours.  . BUN 03/09/2020 43* 8 - 23 mg/dL Final  . Creatinine, Ser 03/09/2020 1.44* 0.44 - 1.00 mg/dL Final  . Calcium 03/09/2020 9.4  8.9 - 10.3 mg/dL Final  . GFR, Estimated 03/09/2020 37* >60 mL/min Final   Comment: (NOTE) Calculated using the CKD-EPI Creatinine Equation (2021)   . Anion gap 03/09/2020 11  5 - 15 Final   Performed at [facility], [place] [address] [place], [place] [address]  . WBC 03/09/2020 6.2  4.0 - 10.5 K/uL Final  . RBC 03/09/2020 3.88  3.87 - 5.11 MIL/uL Final  . Hemoglobin 03/09/2020 11.4* 12.0 - 15.0 g/dL Final  . HCT 03/09/2020 36.0  36 - 46 % Final  . MCV 03/09/2020 92.8  80.0 - 100.0 fL Final  . MCH 03/09/2020 29.4  26.0 - 34.0 pg Final  . MCHC 03/09/2020 31.7  30.0 - 36.0 g/dL Final  . RDW 03/09/2020 14.5  11.5 - 15.5 % Final  . Platelets 03/09/2020 163  150 - 400 K/uL Final   . nRBC 03/09/2020 0.0  0.0 - 0.2 % Final   Performed at [facility], [place] [address], [place], [place] [address]  . Prothrombin Time 03/09/2020 13.3  11.4 - 15.2 seconds Final  . INR 03/09/2020 1.1  0.8 - 1.2 Final   Comment: (NOTE) INR goal varies based on device and disease states. Performed at [facility], [place] [address] [place], [place] [address]   . aPTT 03/09/2020 33  24 - 36 seconds Final   Performed at [facility], [place] [address], [place], [place] [address]  . MRSA, PCR 03/09/2020 NEGATIVE  NEGATIVE Final  . Staphylococcus aureus 03/09/2020 NEGATIVE  NEGATIVE Final   Comment: (NOTE) The Xpert SA Assay (FDA approved for NASAL specimens in patients 72 years of age and older), is one component of a comprehensive surveillance program. It is not intended to diagnose infection nor to guide or monitor treatment. Performed at [facility], [place] [address] [place], [place] [address]   . Color, Urine 03/09/2020 YELLOW  YELLOW Final  . APPearance 03/09/2020 CLEAR  CLEAR Final  . Specific Gravity, Urine 03/09/2020 1.010  1.005 - 1.030 Final  . pH 03/09/2020 5.0  5.0 - 8.0 Final  . Glucose, UA 03/09/2020 NEGATIVE  NEGATIVE mg/dL Final  . Hgb urine dipstick 03/09/2020 NEGATIVE  NEGATIVE Final  . Bilirubin Urine 03/09/2020 NEGATIVE  NEGATIVE Final  . Ketones, ur 03/09/2020 NEGATIVE  NEGATIVE mg/dL Final  . Protein, ur 03/09/2020 NEGATIVE  NEGATIVE mg/dL Final  . Nitrite 03/09/2020 NEGATIVE  NEGATIVE Final  . Chalmers Guest 03/09/2020 NEGATIVE  NEGATIVE Final   Performed at [facility], [place] [address], [place], [place] [address]  Hospital Outpatient Visit on 02/06/2020  Component Date Value Ref Range Status  . MRSA, PCR 02/06/2020 NEGATIVE  NEGATIVE Final  . Staphylococcus aureus 02/06/2020 NEGATIVE  NEGATIVE Final   Comment: (NOTE) The Xpert SA Assay (FDA approved for NASAL specimens in  patients 64 years of age and older),  is one component of a comprehensive surveillance program. It is not intended to diagnose infection nor to guide or monitor treatment. Performed at [facility], [place] [address] [place], [place] [address]   . aPTT 02/06/2020 28  24 - 36 seconds Final   Performed at [facility], [place] [address], [place], [place] [address]  . Sodium 02/06/2020 141  135 - 145 mmol/L Final  . Potassium 02/06/2020 4.3  3.5 - 5.1 mmol/L Final  . Chloride 02/06/2020 97* 98 - 111 mmol/L Final  . CO2 02/06/2020 31  22 - 32 mmol/L Final  . Glucose, Bld 02/06/2020 95  70 - 99 mg/dL Final   Glucose reference range applies only to samples taken after fasting for at least 8 hours.  . BUN 02/06/2020 32* 8 - 23 mg/dL Final  . Creatinine, Ser 02/06/2020 1.51* 0.44 - 1.00 mg/dL Final  . Calcium 02/06/2020 9.7  8.9 - 10.3 mg/dL Final  . GFR, Estimated 02/06/2020 33* >60 mL/min Final  . Anion gap 02/06/2020 13  5 - 15 Final   Performed at [facility], [place] [address], [place], [place] [address]  . WBC 02/06/2020 6.0  4.0 - 10.5 K/uL Final  . RBC 02/06/2020 3.96  3.87 - 5.11 MIL/uL Final  . Hemoglobin 02/06/2020 11.8* 12.0 - 15.0 g/dL Final  . HCT 02/06/2020 37.6  36 - 46 % Final  . MCV 02/06/2020 94.9  80.0 - 100.0 fL Final  . MCH 02/06/2020 29.8  26.0 - 34.0 pg Final  . MCHC 02/06/2020 31.4  30.0 - 36.0 g/dL Final  . RDW 02/06/2020 13.8  11.5 - 15.5 % Final  . Platelets 02/06/2020 180  150 - 400 K/uL Final  . nRBC 02/06/2020 0.0  0.0 - 0.2 % Final   Performed at [facility], [place] [address], [place], [place] [address]  . Prothrombin Time 02/06/2020 12.9  11.4 - 15.2 seconds Final  . INR 02/06/2020 1.0  0.8 - 1.2 Final   Comment: (NOTE) INR goal varies based on device and disease states. Performed at [facility], [place] [address] [place], [place] [address]   . Color, Urine 02/06/2020  STRAW* YELLOW Final  . APPearance 02/06/2020 CLEAR  CLEAR Final  . Specific Gravity, Urine 02/06/2020 1.006  1.005 - 1.030 Final  . pH 02/06/2020 6.0  5.0 - 8.0 Final  . Glucose, UA 02/06/2020 NEGATIVE  NEGATIVE mg/dL Final  . Hgb urine dipstick 02/06/2020 NEGATIVE  NEGATIVE Final  . Bilirubin Urine 02/06/2020 NEGATIVE  NEGATIVE Final  . Ketones, ur 02/06/2020 NEGATIVE  NEGATIVE mg/dL Final  . Protein, ur 02/06/2020 NEGATIVE  NEGATIVE mg/dL Final  . Nitrite 02/06/2020 NEGATIVE  NEGATIVE Final  . Chalmers Guest 02/06/2020 NEGATIVE  NEGATIVE Final   Performed at [place] [address] [place], [place] [address]     X-Rays:DG Knee 1-2 Views Right  Result Date: 03/16/2020 CLINICAL DATA:  Status post right knee replacement EXAM: RIGHT KNEE - 1-2 VIEW COMPARISON:  None. FINDINGS: The patient is status post right knee replacement. Hardware is in good position. Skin staples are noted. Postoperative gas in the soft tissues identified. IMPRESSION: Right knee replacement as above. Electronically Signed   By: Dorise Bullion III M.D   On: 03/16/2020 17:40   PERIPHERAL VASCULAR CATHETERIZATION  Result Date: 03/12/2020   Patient name: LETISHA YERA           MRN: [id number]  DOB: [birth year]            Sex: female  03/12/2020 Pre-operative Diagnosis: History of DVT, need for right total knee arthroplasty Post-operative diagnosis:  Same Surgeon:  Eda Paschal. Donzetta Matters, MD Procedure Performed: 1.  Ultrasound-guided cannulation right common femoral vein 2. central venogram 3.  Placement of Cook Celect IVC filter in the infrarenal IVC 4.  Moderate sedation with fentanyl and Versed for 9 minutes  Indications: 79 year old female with a history of hypercoagulable disorder.  She is now indicated for a total knee arthroplasty and we are placing a filter which should be removed in 3 months.  Findings: Patient has a very tortuous infrarenal IVC.  Bilateral renal veins were identified filter  was placed just below unfortunately is tilted due to the tortuosity of her IVC.  Plan will be to remove filter in 3 months             Procedure:  The patient was identified in the holding area and taken to room 8.  The patient was then placed supine on the table and prepped and draped in the usual sterile fashion.  Under sedation with fentanyl and Versed was administered and a nurse monitored her vital signs throughout the course of the operation.  A time out was called.  Ultrasound was used to evaluate the right common femoral vein this was patent and compressible.  The area was anesthetized 1% lidocaine cannulated with micropuncture needle followed the wire sheath.  And images saved the permanent record.  Bentson wire was placed into the suprarenal IVC.  Introducer sheath was placed.  Venogram was performed.  Filter was deployed in the infrarenal position.  She tolerated procedure well without any complication.  Sheath was removed and pressure held until hemostasis obtained.   Contrast: 10cc  Brandon C. Donzetta Matters, MD [facility] Office: [phone] Pager: [phone]   EKG: Orders placed or performed during the hospital encounter of 11/10/19  . ED EKG  . ED EKG  . EKG 12-Lead  . EKG 12-Lead  . EKG     Hospital Course: [name] is a 79 y.o. who was admitted to [facility]. They were brought to the operating room on 03/16/2020 and underwent Procedure(s): TOTAL KNEE ARTHROPLASTY.  Patient tolerated the procedure well and was later transferred to the recovery room and then to the orthopaedic floor for postoperative care.  They were given PO and IV analgesics for pain control following their surgery.  They were given 24 hours of postoperative antibiotics of  Anti-infectives (From admission, onward)   Start     Dose/Rate Route Frequency Ordered Stop   03/16/20 2330  vancomycin (VANCOCIN) IVPB 1000 mg/200 mL premix        1,000 mg 200 mL/hr over 60  Minutes Intravenous Every 12 hours 03/16/20 1818 03/17/20 0027   03/16/20 1300  gentamicin (GARAMYCIN) 310 mg in dextrose 5 % 100 mL IVPB        5 mg/kg  62.3 kg (Adjusted) 107.8 mL/hr over 60 Minutes Intravenous On call to O.R. 03/16/20 1225 03/16/20 1432   03/16/20 1230  gentamicin (GARAMYCIN) IVPB 100 mg  Status:  Discontinued        100 mg 200 mL/hr over 30 Minutes Intravenous On call to O.R. 03/16/20 1217 03/16/20 1224   03/16/20 1000  vancomycin (VANCOCIN) IVPB 1000 mg/200 mL premix        1,000 mg 200 mL/hr over 60 Minutes Intravenous On call to O.R.  03/16/20 0954 03/16/20 1409     and started on DVT prophylaxis in the form of TED hose and Eliquis and SCDs. Temporary IVC filter.   PT and OT were ordered for total joint protocol.  Discharge planning consulted to help with postop disposition and equipment needs.  Patient had a fair night on the evening of surgery.  They started to get up OOB with therapy on day one.  Continued to work with therapy into day two.  By day two, the patient had progressed with therapy and meeting their goals.  Incision was healing well.  Patient was seen in rounds and was ready to go home.   Diet: Regular diet Activity:WBAT Follow-up:in 10-14 days Disposition - Home Discharged Condition: good   Discharge Instructions    Call MD / Call 911   Complete by: As directed    If you experience chest pain or shortness of breath, CALL 911 and be transported to the hospital emergency room.  If you develope a fever above 101 F, pus (white drainage) or increased drainage or redness at the wound, or calf pain, call your surgeon's office.   Change dressing   Complete by: As directed    Maintain surgical dressing until follow up in the clinic. If the edges start to pull up, may reinforce with tape. If the dressing is no longer working, may remove and cover with gauze and tape, but must keep the area dry and clean.  Call with any questions or concerns.   Constipation  Prevention   Complete by: As directed    Drink plenty of fluids.  Prune juice may be helpful.  You may use a stool softener, such as Colace (over the counter) 100 mg twice a day.  Use MiraLax (over the counter) for constipation as needed.   Diet - low sodium heart healthy   Complete by: As directed    Discharge instructions   Complete by: As directed    Maintain surgical dressing until follow up in the clinic. If the edges start to pull up, may reinforce with tape. If the dressing is no longer working, may remove and cover with gauze and tape, but must keep the area dry and clean.  Follow [facility] in 2 [facility] at [facility]. Call with any questions or concerns.   Increase activity slowly as tolerated   Complete by: As directed    Weight bearing as tolerated with assist device (walker, cane, etc) as directed, use it as long as suggested by your surgeon or therapist, typically at least 4-6 weeks.   TED hose   Complete by: As directed    Use stockings (TED hose) for 2 weeks on both leg(s).  You may remove them at night for sleeping.     Allergies as of 03/17/2020      Reactions   Morphine And Related Other (See Comments)   LARGER DOSES OF MORPHINE CAUSES BODY TWITCHING   Penicillins Anaphylaxis   Childhood reaction Has patient had a PCN reaction causing immediate rash, facial/tongue/throat swelling, SOB or lightheadedness with hypotension: Yes Has patient had a PCN reaction causing severe rash involving mucus membranes or skin necrosis: Unknown Has patient had a PCN reaction that required hospitalization: No Has patient had a PCN reaction occurring within the last 10 years: no (5 or 79 yrs old) If all of the above answers are "NO", then may proceed with Cephalosporin use.   Sulfa Antibiotics Other (See Comments)   Unknown childhood reaction  Medication List    STOP taking these medications   aspirin EC 81 MG tablet     TAKE these medications   allopurinol 100 MG tablet Commonly known  as: ZYLOPRIM Take 100 mg by mouth daily.   apixaban 2.5 MG Tabs tablet Commonly known as: Eliquis Take 1 tablet (2.5 mg total) by mouth 2 (two) times daily.   ARTIFICIAL TEARS OP Place 1 drop into both eyes 3 (three) times daily as needed (dry eyes).   atorvastatin 40 MG tablet Commonly known as: LIPITOR Take 1 tablet (40 mg total) by mouth daily at 6 PM.   Biotin 5000 MCG Tabs Take 5,000 mcg by mouth daily.   CALCIUM 600+D3 PO Take 1 tablet by mouth in the morning and at bedtime.   citalopram 20 MG tablet Commonly known as: CELEXA Take 20 mg by mouth at bedtime.   colchicine 0.6 MG tablet Take 0.6 mg by mouth every other day.   cycloSPORINE 0.05 % ophthalmic emulsion Commonly known as: RESTASIS Place 1 drop into both eyes 2 (two) times daily.   docusate sodium 100 MG capsule Commonly known as: Colace Take 1 capsule (100 mg total) by mouth 2 (two) times daily.   ferrous sulfate 325 (65 FE) MG EC tablet Take 325 mg by mouth daily.   FiberCon 625 MG tablet Generic drug: polycarbophil Take 625 mg by mouth in the morning and at bedtime.   Fish Oil 1200 MG Caps Take 1,200 mg by mouth daily.   FISH OIL PO Take 2,000 mg by mouth daily.   furosemide 80 MG tablet Commonly known as: Lasix Take 1 tablet (80 mg total) by mouth daily.   losartan 50 MG tablet Commonly known as: COZAAR Take 1 tablet (50 mg total) by mouth daily.   metoprolol succinate 25 MG 24 hr tablet Commonly known as: TOPROL-XL Take 1 tablet (25 mg total) by mouth daily.   multivitamin with minerals Tabs tablet Take 1 tablet by mouth daily. Centrum Silver (NO IRON)   omeprazole 20 MG capsule Commonly known as: PRILOSEC Take 20 mg by mouth daily before breakfast.   oxyCODONE-acetaminophen 5-325 MG tablet Commonly known as: Percocet Take 1-2 tablets by mouth every 4 (four) hours as needed for severe pain.   polyethylene glycol 17 g packet Commonly known as: MIRALAX / GLYCOLAX Take 17 g by  mouth daily.   pramipexole 0.5 MG tablet Commonly known as: MIRAPEX Take 0.5 mg by mouth daily in the afternoon.   predniSONE 10 MG tablet Commonly known as: DELTASONE Take by mouth.   temazepam 15 MG capsule Commonly known as: RESTORIL Take 1 capsule (15 mg total) by mouth at bedtime.   Turmeric Curcumin 500 MG Caps Take 500 mg by mouth at bedtime.   Vitamin D3 50 MCG (2000 UT) Tabs Take 2,000 Units by mouth in the morning and at bedtime.   vitamin E 180 MG (400 UNITS) capsule Take 400 Units by mouth at bedtime.            Discharge Care Instructions  (From admission, onward)         Start     Ordered   03/17/20 0000  Change dressing       Comments: Maintain surgical dressing until follow up in the clinic. If the edges start to pull up, may reinforce with tape. If the dressing is no longer working, may remove and cover with gauze and tape, but must keep the area dry and clean.  Call with  any questions or concerns.   03/17/20 1464          Follow-up Information    Susa Day, MD Follow up in 2 week(s).   Specialty: Orthopedic Surgery Contact information: [address] [place] [address] [phone]               Signed: Lacie Draft, PA-C Orthopaedic Surgery 03/21/2020, 10:09 AM

## 2020-03-30 DIAGNOSIS — Z471 Aftercare following joint replacement surgery: Secondary | ICD-10-CM | POA: Diagnosis not present

## 2020-03-30 DIAGNOSIS — Z5189 Encounter for other specified aftercare: Secondary | ICD-10-CM | POA: Diagnosis not present

## 2020-04-03 DIAGNOSIS — G4700 Insomnia, unspecified: Secondary | ICD-10-CM | POA: Diagnosis not present

## 2020-04-03 DIAGNOSIS — D508 Other iron deficiency anemias: Secondary | ICD-10-CM | POA: Diagnosis not present

## 2020-04-03 DIAGNOSIS — F329 Major depressive disorder, single episode, unspecified: Secondary | ICD-10-CM | POA: Diagnosis not present

## 2020-04-03 DIAGNOSIS — M8580 Other specified disorders of bone density and structure, unspecified site: Secondary | ICD-10-CM | POA: Diagnosis not present

## 2020-04-03 DIAGNOSIS — N159 Renal tubulo-interstitial disease, unspecified: Secondary | ICD-10-CM | POA: Diagnosis not present

## 2020-04-03 DIAGNOSIS — N183 Chronic kidney disease, stage 3 unspecified: Secondary | ICD-10-CM | POA: Diagnosis not present

## 2020-04-03 DIAGNOSIS — K219 Gastro-esophageal reflux disease without esophagitis: Secondary | ICD-10-CM | POA: Diagnosis not present

## 2020-04-03 DIAGNOSIS — I10 Essential (primary) hypertension: Secondary | ICD-10-CM | POA: Diagnosis not present

## 2020-04-03 DIAGNOSIS — M1990 Unspecified osteoarthritis, unspecified site: Secondary | ICD-10-CM | POA: Diagnosis not present

## 2020-04-03 DIAGNOSIS — E7800 Pure hypercholesterolemia, unspecified: Secondary | ICD-10-CM | POA: Diagnosis not present

## 2020-04-03 DIAGNOSIS — M19041 Primary osteoarthritis, right hand: Secondary | ICD-10-CM | POA: Diagnosis not present

## 2020-04-04 ENCOUNTER — Ambulatory Visit: Payer: PPO | Attending: Specialist | Admitting: Physical Therapy

## 2020-04-04 ENCOUNTER — Other Ambulatory Visit: Payer: Self-pay

## 2020-04-04 ENCOUNTER — Encounter: Payer: Self-pay | Admitting: Physical Therapy

## 2020-04-04 DIAGNOSIS — M25561 Pain in right knee: Secondary | ICD-10-CM | POA: Insufficient documentation

## 2020-04-04 DIAGNOSIS — M25661 Stiffness of right knee, not elsewhere classified: Secondary | ICD-10-CM | POA: Diagnosis not present

## 2020-04-04 DIAGNOSIS — R262 Difficulty in walking, not elsewhere classified: Secondary | ICD-10-CM | POA: Insufficient documentation

## 2020-04-04 DIAGNOSIS — R600 Localized edema: Secondary | ICD-10-CM | POA: Diagnosis not present

## 2020-04-04 NOTE — Therapy (Signed)
[facility] [place], [place], [address] Phone: [phone]   Fax:  [phone]  Physical Therapy Evaluation  Patient Details  Name: [name] MRN: [id number] Date of Birth: 05/09/1940 Referring Provider (PT): Susa Day, MD   Encounter Date: 04/04/2020   PT End of Session - 04/04/20 0858    Visit Number 1    Number of Visits 12    Date for PT Re-Evaluation 05/09/20    Authorization Type [employer] (CQ modifier) FOTO; progress note every 10th visit    PT Start Time 0815    PT Stop Time 0857    PT Time Calculation (min) 42 min    Equipment Utilized During Treatment Other (comment)   rollator   Activity Tolerance Patient tolerated treatment well    Behavior During Therapy [facility] for tasks assessed/performed           Past Medical History:  Diagnosis Date  . Anemia   . Arthritis    osteoarthritis Shoulder,neck  . Bell's palsy 1980s  . CHF (congestive heart failure) ([place]) 06/2019  . Chronic kidney disease    Stage III  . Dyspnea 06/2019   On exertion  . GERD (gastroesophageal reflux disease)   . Hemochromatosis   . History of hiatal hernia   . Hypertension   . Iron deficiency anemia due to chronic blood loss 03/10/2017  . PE (pulmonary embolism) 2007   x2  . Peripheral vascular disease (HCC)    DVT Rt leg  . Pneumonia 1990s   "walking" pneumonia  . Prothrombin gene mutation ([place]) 05/30/2013  . Restless legs   . Right leg DVT ([place]) 05/30/2013  . Stroke [facility])    found on a MRI, she's not aware otherwise    Past Surgical History:  Procedure Laterality Date  . BACK SURGERY    . CARDIAC CATHETERIZATION Bilateral 2005  . COLONOSCOPY    . IVC FILTER INSERTION N/A 03/12/2020   Procedure: IVC FILTER INSERTION;  Surgeon: Waynetta Sandy, MD;  Location: [place] [facility];  Service: Cardiovascular;  Laterality: N/A;  . RIGHT/LEFT HEART CATH AND CORONARY ANGIOGRAPHY N/A 07/12/2019    Procedure: RIGHT/LEFT HEART CATH AND CORONARY ANGIOGRAPHY;  Surgeon: Troy Sine, MD;  Location: [place] [facility];  Service: Cardiovascular;  Laterality: N/A;  . SHOULDER SURGERY Bilateral   . TOTAL KNEE ARTHROPLASTY Right 03/16/2020   Procedure: TOTAL KNEE ARTHROPLASTY;  Surgeon: Susa Day, MD;  Location: [facility];  Service: Orthopedics;  Laterality: Right;  2.5 hrs    There were no vitals filed for this visit.    Subjective Assessment - 04/04/20 0913    Subjective COVID-19 screening performed upon arrival. Patient arrives to physical therapy with reports of right knee pain, difficulty walking, and increased R knee edema secondary to a R TKA on 03/16/2020. Patient requires assistance from husband for lower  body dressing. Patient compliant with HEP and edema management but with pain. Patient reports pain at worst as 8/10 and pain at best as 0/10 with rest and pain medication. Patient's goals are to decrease pain, improve movement, improve strength, improve standing and walking tolerance, and improve ability to perform home activities and ADLs.    Pertinent History HTN, CHF, Chronic kidney disease, sulfa drug allergy, IVC filter, history of shoulder surgery, L foot drop after lumbar surgery    Limitations Standing;Sitting;Walking;House hold activities    How long can you stand comfortably? short periods    How long can you walk  comfortably? between rooms    Diagnostic tests x-ray    Patient Stated Goals walk with rollator    Currently in Pain? Yes    Pain Score 6     Pain Location Knee    Pain Orientation Left    Pain Descriptors / Indicators Throbbing    Pain Type Surgical pain    Pain Onset 1 to 4 weeks ago    Pain Frequency Constant    Aggravating Factors  "workouts and moving wrong"    Pain Relieving Factors "doing nothing"    Effect of Pain on Daily Activities "moving around but not all the time"              [facility] PT Assessment - 04/04/20 0001      Assessment    Medical Diagnosis aftercare following joint replacement surgery    Referring Provider (PT) Susa Day, MD    Onset Date/Surgical Date 03/16/20    Next MD Visit End of December    Prior Therapy no      Precautions   Precautions Other (comment)    Precaution Comments no ultrasound      Restrictions   Weight Bearing Restrictions No      Balance Screen   Has the patient fallen in the past 6 months No    Has the patient had a decrease in activity level because of a fear of falling?  Yes    Is the patient reluctant to leave their home because of a fear of falling?  No      Home Environment   Living Environment Private residence    Living Arrangements Spouse/significant other    Type of [place] to enter    Entrance Stairs-Number of Steps 3    Entrance Stairs-Rails Right      Prior Function   Level of Independence Needs assistance with homemaking;Needs assistance with ADLs      Observation/Other Assessments   Focus on Therapeutic Outcomes (FOTO)  50% limitation      Observation/Other Assessments-Edema    Edema Circumferential      Circumferential Edema   Circumferential - Right 50.0 cm mid patella    Circumferential - Left  45.0 cm mid patella      ROM / Strength   AROM / PROM / Strength AROM;PROM;Strength      AROM   Overall AROM  Deficits    AROM Assessment Site Knee    Right/Left Knee Right    Right Knee Extension 8    Right Knee Flexion 78      PROM   Overall PROM  Deficits    PROM Assessment Site Knee    Right/Left Knee Right    Right Knee Extension 5    Right Knee Flexion 88      Palpation   Patella mobility limited secondary to edema      Transfers   Comments able to sit<>stand independently; uses L LE to hook R LE for transfers on/off plinth      Ambulation/Gait   Assistive device Rolling walker    Gait Pattern Decreased stance time - right;Decreased step length - left;Decreased stride length;Decreased weight shift to  right;Decreased hip/knee flexion - right;Left steppage;Antalgic;Trunk flexed                      Objective measurements completed on examination: See above findings.       [facility] Adult PT Treatment/Exercise - 04/04/20 0001  Modalities   Modalities Vasopneumatic      Vasopneumatic   Number Minutes Vasopneumatic  10 minutes    Vasopnuematic Location  Knee    Vasopneumatic Pressure --   No pressure   Vasopneumatic Temperature  34 for pain and edema                  PT Education - 04/04/20 1008    Education Details continue HEP, ice and elevation frequency for edema control and pain relief    Person(s) Educated Patient    Methods Explanation    Comprehension Verbalized understanding               PT Long Term Goals - 04/04/20 1013      PT LONG TERM GOAL #1   Title Patient will be independent with HEP and its progression.    Time 4    Period Weeks    Status On-going      PT LONG TERM GOAL #2   Title Patient will demonstrate 5 degrees or less of R knee extension AROM to improve gait mechanics.    Baseline 8 degrees R knee extension AROM    Time 4    Period Weeks    Status New      PT LONG TERM GOAL #3   Title Patient will demonstrate 115+ degrees of right knee flexion AROM to improve ability to perform home activities.    Time 4    Period Weeks    Status New      PT LONG TERM GOAL #4   Title Patient will report ability to perform ADLs independently with R knee pain less than or equal to 3/10.    Time 4    Period Weeks    Status New      PT LONG TERM GOAL #5   Title Patient will ambulate with rollator with step through gait pattern to safely walk in home and around community.    Time 4    Period Weeks    Status New                  Plan - 04/04/20 0904    Clinical Impression Statement Patient is a 79 year old female who presents to physical therapy with R knee pain, difficulty walking and increased edema secondary to a R  TKA on 03/16/2020. Patient able to perform sit<>stands with supervision but requires left LE to raise R LE on/off plinth. Patient ambulates with varying step to and step through gait patttern with decreased R stance time, decreased left stride length, and with L steppage gait secondary to L foot drop. Patient and PT discussed continuing HEP provided by surgeon and importance of ice and elevation as tolerated for edema control. Patient educated on plan of care and progressions through episode of care. Patient reported understanding. Patient would benefit from skilled physical therapy to address deficits and goals.    Personal Factors and Comorbidities Comorbidity 3+    Comorbidities R TKA 03/16/2020,  HTN, CHF, Chronic kidney disease, sulfa drug allergy, IVC filter, history of shoulder surgery, L foot drop after lumbar surgery    Examination-Activity Limitations Bed Mobility;Locomotion Level;Transfers;Sleep;Stairs;Stand;Dressing    Stability/Clinical Decision Making Stable/Uncomplicated    Clinical Decision Making Low    Rehab Potential Good    PT Frequency 3x / week    PT Duration 4 weeks    PT Treatment/Interventions ADLs/Self Care Home Management;Cryotherapy;Electrical Stimulation;Gait training;Stair training;Functional mobility training;Therapeutic activities;Therapeutic exercise;Balance training;Neuromuscular re-education;Manual techniques;Passive  range of motion;Patient/family education;Vasopneumatic Device    PT Next Visit Plan nustep, right knee AROM and PROM, gait training to return to ambulation with rollator, VASO WITH NO COMPRESSION.    PT Home Exercise Plan continue HEP provided by surgeon    Consulted and Agree with Plan of Care Patient           Patient will benefit from skilled therapeutic intervention in order to improve the following deficits and impairments:  Abnormal gait, Decreased balance, Decreased strength, Increased edema, Pain, Decreased range of motion, Difficulty  walking  Visit Diagnosis: Acute pain of right knee - Plan: PT plan of care cert/re-cert  Stiffness of right knee, not elsewhere classified - Plan: PT plan of care cert/re-cert  Localized edema - Plan: PT plan of care cert/re-cert  Difficulty in walking, not elsewhere classified - Plan: PT plan of care cert/re-cert     Problem List Patient Active Problem List   Diagnosis Date Noted  . S/P TKR (total knee replacement) using cement 03/16/2020  . Hyperlipidemia 01/31/2020  . AKI (acute kidney injury) ([place]) 09/15/2019  . Right flank pain 09/15/2019  . Anemia 09/15/2019  . Abnormal nuclear cardiac imaging test   . CHF (congestive heart failure) ([place]) 07/09/2019  . Dyspnea 07/09/2019  . Respiratory failure with hypoxia ([place]) 07/09/2019  . Shoulder pain 07/09/2019  . Dyspnea on exertion 06/03/2019  . Bilateral lower extremity edema 06/03/2019  . Left bundle branch block 06/03/2019  . Iron deficiency anemia due to chronic blood loss 03/10/2017  . Postoperative wound dehiscence 12/02/2016  . E. coli UTI   . Abnormal urinalysis   . Medication side effect   . Neuropathic pain   . History of DVT (deep vein thrombosis)   . HTN (hypertension)   . RLS (restless legs syndrome)   . Urinary retention   . Hypokalemia   . Hypoalbuminemia due to protein-calorie malnutrition ([place])   . Acute blood loss anemia   . Lumbar stenosis with neurogenic claudication 11/18/2016  . Spondylolisthesis of lumbar region 11/12/2016  . Hereditary hemochromatosis ([place]) 07/28/2016  . Prothrombin gene mutation ([place]) 05/30/2013  . Right leg DVT ([place]) 05/30/2013  . Hemochromatosis 07/29/2012    Gabriela Eves, PT, DPT 04/04/2020, 10:23 AM  [facility] [address]  [place], [place], [address] Phone: [phone]   Fax:  [phone]  Name: AUJANAE MCCULLUM MRN: [id number] Date of Birth: [birth year]

## 2020-04-06 ENCOUNTER — Other Ambulatory Visit: Payer: Self-pay

## 2020-04-06 ENCOUNTER — Encounter: Payer: Self-pay | Admitting: Physical Therapy

## 2020-04-06 ENCOUNTER — Ambulatory Visit: Payer: PPO | Admitting: Physical Therapy

## 2020-04-06 DIAGNOSIS — R600 Localized edema: Secondary | ICD-10-CM

## 2020-04-06 DIAGNOSIS — M25561 Pain in right knee: Secondary | ICD-10-CM

## 2020-04-06 DIAGNOSIS — M25661 Stiffness of right knee, not elsewhere classified: Secondary | ICD-10-CM

## 2020-04-06 DIAGNOSIS — R262 Difficulty in walking, not elsewhere classified: Secondary | ICD-10-CM

## 2020-04-06 NOTE — Therapy (Signed)
[facility] [place], [place], [address] Phone: [phone]   Fax:  [phone]  Physical Therapy Treatment  Patient Details  Name: [name] MRN: [id number] Date of Birth: 1940-06-04 Referring Provider (PT): Susa Day, MD   Encounter Date: 04/06/2020   PT End of Session - 04/06/20 0831    Visit Number 2    Number of Visits 12    Date for PT Re-Evaluation 05/09/20    Authorization Type [employer] (CQ modifier) FOTO; progress note every 10th visit    PT Start Time 0820    PT Stop Time 0901    PT Time Calculation (min) 41 min    Equipment Utilized During Treatment Other (comment)   FWW   Activity Tolerance Patient tolerated treatment well    Behavior During Therapy [facility] for tasks assessed/performed           Past Medical History:  Diagnosis Date  . Anemia   . Arthritis    osteoarthritis Shoulder,neck  . Bell's palsy 1980s  . CHF (congestive heart failure) ([place]) 06/2019  . Chronic kidney disease    Stage III  . Dyspnea 06/2019   On exertion  . GERD (gastroesophageal reflux disease)   . Hemochromatosis   . History of hiatal hernia   . Hypertension   . Iron deficiency anemia due to chronic blood loss 03/10/2017  . PE (pulmonary embolism) 2007   x2  . Peripheral vascular disease (HCC)    DVT Rt leg  . Pneumonia 1990s   "walking" pneumonia  . Prothrombin gene mutation ([place]) 05/30/2013  . Restless legs   . Right leg DVT ([place]) 05/30/2013  . Stroke [facility])    found on a MRI, she's not aware otherwise    Past Surgical History:  Procedure Laterality Date  . BACK SURGERY    . CARDIAC CATHETERIZATION Bilateral 2005  . COLONOSCOPY    . IVC FILTER INSERTION N/A 03/12/2020   Procedure: IVC FILTER INSERTION;  Surgeon: Waynetta Sandy, MD;  Location: [place] [facility];  Service: Cardiovascular;  Laterality: N/A;  . RIGHT/LEFT HEART CATH AND CORONARY ANGIOGRAPHY N/A 07/12/2019   Procedure:  RIGHT/LEFT HEART CATH AND CORONARY ANGIOGRAPHY;  Surgeon: Troy Sine, MD;  Location: [place] [facility];  Service: Cardiovascular;  Laterality: N/A;  . SHOULDER SURGERY Bilateral   . TOTAL KNEE ARTHROPLASTY Right 03/16/2020   Procedure: TOTAL KNEE ARTHROPLASTY;  Surgeon: Susa Day, MD;  Location: [facility];  Service: Orthopedics;  Laterality: Right;  2.5 hrs    There were no vitals filed for this visit.   Subjective Assessment - 04/06/20 0830    Subjective COVID 19 screening performed on patient upon arrival. Patient reports she is back in the recliner to sleep. Her knee feels better stretched out.    Pertinent History HTN, CHF, Chronic kidney disease, sulfa drug allergy, IVC filter, history of shoulder surgery, L foot drop after lumbar surgery    Limitations Standing;Sitting;Walking;House hold activities    How long can you stand comfortably? short periods    How long can you walk comfortably? between rooms    Diagnostic tests x-ray    Patient Stated Goals walk with rollator    Currently in Pain? Yes    Pain Score 7     Pain Location Knee    Pain Orientation Left    Pain Descriptors / Indicators Discomfort    Pain Type Surgical pain    Pain Onset 1 to 4 weeks ago  Pain Frequency Constant              [facility] PT Assessment - 04/06/20 0001      Assessment   Medical Diagnosis aftercare following joint replacement surgery    Referring Provider (PT) Susa Day, MD    Onset Date/Surgical Date 03/16/20    Next MD Visit End of December    Prior Therapy no      Precautions   Precautions Other (comment)    Precaution Comments no ultrasound      Restrictions   Weight Bearing Restrictions No      ROM / Strength   AROM / PROM / Strength AROM      AROM   Overall AROM  Deficits    AROM Assessment Site Knee    Right/Left Knee Right    Right Knee Flexion 97                         [facility] Adult PT Treatment/Exercise - 04/06/20 0001      Exercises    Exercises Knee/Hip      Knee/Hip Exercises: Aerobic   Nustep L1, seat 10-9 x17 min      Knee/Hip Exercises: Supine   Quad Sets Strengthening;Right;10 reps    Short Arc Quad Sets Strengthening;Right;20 reps    Heel Slides AROM;Right;20 reps      Modalities   Modalities Vasopneumatic      Vasopneumatic   Number Minutes Vasopneumatic  10 minutes    Vasopnuematic Location  Knee    Vasopneumatic Pressure --   No pressure   Vasopneumatic Temperature  34 for pain and edema                       PT Long Term Goals - 04/06/20 0858      PT LONG TERM GOAL #1   Title Patient will be independent with HEP and its progression.    Time 4    Period Weeks    Status On-going      PT LONG TERM GOAL #2   Title Patient will demonstrate 5 degrees or less of R knee extension AROM to improve gait mechanics.    Baseline 8 degrees R knee extension AROM    Time 4    Period Weeks    Status On-going      PT LONG TERM GOAL #3   Title Patient will demonstrate 115+ degrees of right knee flexion AROM to improve ability to perform home activities.    Time 4    Period Weeks    Status On-going      PT LONG TERM GOAL #4   Title Patient will report ability to perform ADLs independently with R knee pain less than or equal to 3/10.    Time 4    Period Weeks    Status On-going      PT LONG TERM GOAL #5   Title Patient will ambulate with rollator with step through gait pattern to safely walk in home and around community.    Time 4    Period Weeks    Status On-going                 Plan - 04/06/20 0854    Clinical Impression Statement Patient presented in clinic with reports of increased R knee pain today. Patient using FWW for ambulation at this time and educated that we will progress her to gait training with her  normal rollator once strength is improved. Patient able to complete light strengthening and ROM exercises with mod VCs and tactile cueing using towel for all therex.  Normal vasopneumatic response with no pressure for pain control. Patient reports compliance with icing and elevation of RLE at home.    Personal Factors and Comorbidities Comorbidity 3+    Comorbidities R TKA 03/16/2020,  HTN, CHF, Chronic kidney disease, sulfa drug allergy, IVC filter, history of shoulder surgery, L foot drop after lumbar surgery    Examination-Activity Limitations Bed Mobility;Locomotion Level;Transfers;Sleep;Stairs;Stand;Dressing    Stability/Clinical Decision Making Stable/Uncomplicated    Rehab Potential Good    PT Frequency 3x / week    PT Duration 4 weeks    PT Treatment/Interventions ADLs/Self Care Home Management;Cryotherapy;Electrical Stimulation;Gait training;Stair training;Functional mobility training;Therapeutic activities;Therapeutic exercise;Balance training;Neuromuscular re-education;Manual techniques;Passive range of motion;Patient/family education;Vasopneumatic Device    PT Next Visit Plan nustep, right knee AROM and PROM, gait training to return to ambulation with rollator, VASO WITH NO COMPRESSION.    PT Home Exercise Plan continue HEP provided by surgeon    Consulted and Agree with Plan of Care Patient           Patient will benefit from skilled therapeutic intervention in order to improve the following deficits and impairments:  Abnormal gait,Decreased balance,Decreased strength,Increased edema,Pain,Decreased range of motion,Difficulty walking  Visit Diagnosis: Acute pain of right knee  Stiffness of right knee, not elsewhere classified  Localized edema  Difficulty in walking, not elsewhere classified     Problem List Patient Active Problem List   Diagnosis Date Noted  . S/P TKR (total knee replacement) using cement 03/16/2020  . Hyperlipidemia 01/31/2020  . AKI (acute kidney injury) ([place]) 09/15/2019  . Right flank pain 09/15/2019  . Anemia 09/15/2019  . Abnormal nuclear cardiac imaging test   . CHF (congestive heart failure) ([place])  07/09/2019  . Dyspnea 07/09/2019  . Respiratory failure with hypoxia ([place]) 07/09/2019  . Shoulder pain 07/09/2019  . Dyspnea on exertion 06/03/2019  . Bilateral lower extremity edema 06/03/2019  . Left bundle branch block 06/03/2019  . Iron deficiency anemia due to chronic blood loss 03/10/2017  . Postoperative wound dehiscence 12/02/2016  . E. coli UTI   . Abnormal urinalysis   . Medication side effect   . Neuropathic pain   . History of DVT (deep vein thrombosis)   . HTN (hypertension)   . RLS (restless legs syndrome)   . Urinary retention   . Hypokalemia   . Hypoalbuminemia due to protein-calorie malnutrition ([place])   . Acute blood loss anemia   . Lumbar stenosis with neurogenic claudication 11/18/2016  . Spondylolisthesis of lumbar region 11/12/2016  . Hereditary hemochromatosis ([place]) 07/28/2016  . Prothrombin gene mutation ([place]) 05/30/2013  . Right leg DVT ([place]) 05/30/2013  . Hemochromatosis 07/29/2012    Standley Brooking, PTA 04/06/2020, 9:33 AM  [facility] [address] [place], [place], [address] Phone: [phone]   Fax:  [phone]  Name: [name] MRN: [id number] Date of Birth: April 05, 1941

## 2020-04-09 ENCOUNTER — Encounter: Payer: Self-pay | Admitting: Physical Therapy

## 2020-04-09 ENCOUNTER — Ambulatory Visit: Payer: PPO | Admitting: Physical Therapy

## 2020-04-09 ENCOUNTER — Other Ambulatory Visit: Payer: Self-pay

## 2020-04-09 DIAGNOSIS — R262 Difficulty in walking, not elsewhere classified: Secondary | ICD-10-CM

## 2020-04-09 DIAGNOSIS — R600 Localized edema: Secondary | ICD-10-CM

## 2020-04-09 DIAGNOSIS — M25661 Stiffness of right knee, not elsewhere classified: Secondary | ICD-10-CM

## 2020-04-09 DIAGNOSIS — M25561 Pain in right knee: Secondary | ICD-10-CM | POA: Diagnosis not present

## 2020-04-09 NOTE — Therapy (Signed)
[facility] [place], [place], [address] Phone: [phone]   Fax:  [phone]  Physical Therapy Treatment  Patient Details  Name: [name] MRN: [id number] Date of Birth: Aug 17, 1940 Referring Provider (PT): Susa Day, MD   Encounter Date: 04/09/2020   PT End of Session - 04/09/20 0911    Visit Number 3    Number of Visits 12    Date for PT Re-Evaluation 05/09/20    Authorization Type [employer] (CQ modifier) FOTO; progress note every 10th visit    PT Start Time 0900    PT Stop Time 0948    PT Time Calculation (min) 48 min    Equipment Utilized During Treatment Other (comment)   rolling walker   Activity Tolerance Patient tolerated treatment well    Behavior During Therapy [facility] for tasks assessed/performed           Past Medical History:  Diagnosis Date  . Anemia   . Arthritis    osteoarthritis Shoulder,neck  . Bell's palsy 1980s  . CHF (congestive heart failure) ([place]) 06/2019  . Chronic kidney disease    Stage III  . Dyspnea 06/2019   On exertion  . GERD (gastroesophageal reflux disease)   . Hemochromatosis   . History of hiatal hernia   . Hypertension   . Iron deficiency anemia due to chronic blood loss 03/10/2017  . PE (pulmonary embolism) 2007   x2  . Peripheral vascular disease (HCC)    DVT Rt leg  . Pneumonia 1990s   "walking" pneumonia  . Prothrombin gene mutation ([place]) 05/30/2013  . Restless legs   . Right leg DVT ([place]) 05/30/2013  . Stroke [facility])    found on a MRI, she's not aware otherwise    Past Surgical History:  Procedure Laterality Date  . BACK SURGERY    . CARDIAC CATHETERIZATION Bilateral 2005  . COLONOSCOPY    . IVC FILTER INSERTION N/A 03/12/2020   Procedure: IVC FILTER INSERTION;  Surgeon: Waynetta Sandy, MD;  Location: [place];  Service: Cardiovascular;  Laterality: N/A;  . RIGHT/LEFT HEART CATH AND CORONARY ANGIOGRAPHY N/A 07/12/2019    Procedure: RIGHT/LEFT HEART CATH AND CORONARY ANGIOGRAPHY;  Surgeon: Troy Sine, MD;  Location: [place] [facility];  Service: Cardiovascular;  Laterality: N/A;  . SHOULDER SURGERY Bilateral   . TOTAL KNEE ARTHROPLASTY Right 03/16/2020   Procedure: TOTAL KNEE ARTHROPLASTY;  Surgeon: Susa Day, MD;  Location: [facility];  Service: Orthopedics;  Laterality: Right;  2.5 hrs    There were no vitals filed for this visit.   Subjective Assessment - 04/09/20 0910    Subjective COVID 19 screening performed on patient upon arrival. Patient reports doing fairly well this morning but reports pain pills take some time to work.    Pertinent History HTN, CHF, Chronic kidney disease, sulfa drug allergy, IVC filter, history of shoulder surgery, L foot drop after lumbar surgery    Limitations Standing;Sitting;Walking;House hold activities    How long can you stand comfortably? short periods    How long can you walk comfortably? between rooms    Diagnostic tests x-ray    Patient Stated Goals walk with rollator    Currently in Pain? Yes    Pain Score --   did not provide number on pain scale             [facility] PT Assessment - 04/09/20 0001      Assessment   Medical Diagnosis  aftercare following joint replacement surgery; S/P R TKA    Referring Provider (PT) Susa Day, MD    Onset Date/Surgical Date 03/16/20    Next MD Visit End of December    Prior Therapy no      Precautions   Precautions Other (comment)    Precaution Comments no ultrasound      Restrictions   Weight Bearing Restrictions No                         [facility] Adult PT Treatment/Exercise - 04/09/20 0001      Knee/Hip Exercises: Stretches   Passive Hamstring Stretch Right;10 seconds;Other (comment)   x10 reps   Passive Hamstring Stretch Limitations on 6" step    Knee: Self-Stretch to increase Flexion Right;10 seconds;Other (comment)   x10 reps   Knee: Self-Stretch Limitations on 6" step      Knee/Hip  Exercises: Aerobic   Nustep L2, seat 9-7 15 min      Knee/Hip Exercises: Standing   Hip Flexion AROM;Both;1 set;10 reps;Knee bent      Modalities   Modalities Vasopneumatic      Vasopneumatic   Number Minutes Vasopneumatic  10 minutes    Vasopnuematic Location  Knee    Vasopneumatic Pressure --   no presure   Vasopneumatic Temperature  34 for pain and edema      Manual Therapy   Manual Therapy Passive ROM    Passive ROM gentle PROM into knee flexion and extension with intermitent oscillations, in sitting                       PT Long Term Goals - 04/06/20 0858      PT LONG TERM GOAL #1   Title Patient will be independent with HEP and its progression.    Time 4    Period Weeks    Status On-going      PT LONG TERM GOAL #2   Title Patient will demonstrate 5 degrees or less of R knee extension AROM to improve gait mechanics.    Baseline 8 degrees R knee extension AROM    Time 4    Period Weeks    Status On-going      PT LONG TERM GOAL #3   Title Patient will demonstrate 115+ degrees of right knee flexion AROM to improve ability to perform home activities.    Time 4    Period Weeks    Status On-going      PT LONG TERM GOAL #4   Title Patient will report ability to perform ADLs independently with R knee pain less than or equal to 3/10.    Time 4    Period Weeks    Status On-going      PT LONG TERM GOAL #5   Title Patient will ambulate with rollator with step through gait pattern to safely walk in home and around community.    Time 4    Period Weeks    Status On-going                 Plan - 04/09/20 0943    Clinical Impression Statement Patient responded well to therapy session with slight reports of increased pain. Standing TEs initiated with good form after verbal cuing and demonstration. Patient reported an increase of pain to 6/10 with a decrease of pain after sitting. No adverse affect upon removal of [place] with no pressure.  Personal  Factors and Comorbidities Comorbidity 3+    Comorbidities R TKA 03/16/2020,  HTN, CHF, Chronic kidney disease, sulfa drug allergy, IVC filter, history of shoulder surgery, L foot drop after lumbar surgery    Examination-Activity Limitations Bed Mobility;Locomotion Level;Transfers;Sleep;Stairs;Stand;Dressing    Stability/Clinical Decision Making Stable/Uncomplicated    Clinical Decision Making Low    Rehab Potential Good    PT Frequency 3x / week    PT Duration 4 weeks    PT Treatment/Interventions ADLs/Self Care Home Management;Cryotherapy;Electrical Stimulation;Gait training;Stair training;Functional mobility training;Therapeutic activities;Therapeutic exercise;Balance training;Neuromuscular re-education;Manual techniques;Passive range of motion;Patient/family education;Vasopneumatic Device    PT Next Visit Plan nustep, right knee AROM and PROM, gait training to return to ambulation with rollator, VASO WITH NO COMPRESSION.    PT Home Exercise Plan continue HEP provided by surgeon    Consulted and Agree with Plan of Care Patient           Patient will benefit from skilled therapeutic intervention in order to improve the following deficits and impairments:  Abnormal gait,Decreased balance,Decreased strength,Increased edema,Pain,Decreased range of motion,Difficulty walking  Visit Diagnosis: Acute pain of right knee  Stiffness of right knee, not elsewhere classified  Localized edema  Difficulty in walking, not elsewhere classified     Problem List Patient Active Problem List   Diagnosis Date Noted  . S/P TKR (total knee replacement) using cement 03/16/2020  . Hyperlipidemia 01/31/2020  . AKI (acute kidney injury) ([place]) 09/15/2019  . Right flank pain 09/15/2019  . Anemia 09/15/2019  . Abnormal nuclear cardiac imaging test   . CHF (congestive heart failure) ([place]) 07/09/2019  . Dyspnea 07/09/2019  . Respiratory failure with hypoxia ([place]) 07/09/2019  . Shoulder pain 07/09/2019  .  Dyspnea on exertion 06/03/2019  . Bilateral lower extremity edema 06/03/2019  . Left bundle branch block 06/03/2019  . Iron deficiency anemia due to chronic blood loss 03/10/2017  . Postoperative wound dehiscence 12/02/2016  . E. coli UTI   . Abnormal urinalysis   . Medication side effect   . Neuropathic pain   . History of DVT (deep vein thrombosis)   . HTN (hypertension)   . RLS (restless legs syndrome)   . Urinary retention   . Hypokalemia   . Hypoalbuminemia due to protein-calorie malnutrition ([place])   . Acute blood loss anemia   . Lumbar stenosis with neurogenic claudication 11/18/2016  . Spondylolisthesis of lumbar region 11/12/2016  . Hereditary hemochromatosis ([place]) 07/28/2016  . Prothrombin gene mutation ([place]) 05/30/2013  . Right leg DVT ([place]) 05/30/2013  . Hemochromatosis 07/29/2012    Gabriela Eves, PT, DPT 04/09/2020, 10:36 AM  [facility] [address] [place], [place], [address] Phone: [phone]   Fax:  [phone]  Name: [name] MRN: [id number] Date of Birth: 27-Aug-1940

## 2020-04-11 ENCOUNTER — Other Ambulatory Visit: Payer: Self-pay

## 2020-04-11 ENCOUNTER — Ambulatory Visit: Payer: PPO | Admitting: Physical Therapy

## 2020-04-11 DIAGNOSIS — M25661 Stiffness of right knee, not elsewhere classified: Secondary | ICD-10-CM

## 2020-04-11 DIAGNOSIS — M25561 Pain in right knee: Secondary | ICD-10-CM

## 2020-04-11 DIAGNOSIS — R262 Difficulty in walking, not elsewhere classified: Secondary | ICD-10-CM

## 2020-04-11 DIAGNOSIS — R600 Localized edema: Secondary | ICD-10-CM

## 2020-04-11 NOTE — Therapy (Signed)
[facility] [place], [place], [address] Phone: [phone]   Fax:  [phone]  Physical Therapy Treatment  Patient Details  Name: [name] MRN: [id number] Date of Birth: June 12, 1940 Referring Provider (PT): Susa Day, MD   Encounter Date: 04/11/2020   PT End of Session - 04/11/20 0954    Visit Number 4    Number of Visits 12    Date for PT Re-Evaluation 05/09/20    Authorization Type [employer] (CQ modifier) FOTO; progress note every 10th visit    PT Start Time 0900    PT Stop Time 0953    PT Time Calculation (min) 53 min    Activity Tolerance Patient tolerated treatment well    Behavior During Therapy [facility] for tasks assessed/performed           Past Medical History:  Diagnosis Date  . Anemia   . Arthritis    osteoarthritis Shoulder,neck  . Bell's palsy 1980s  . CHF (congestive heart failure) ([place]) 06/2019  . Chronic kidney disease    Stage III  . Dyspnea 06/2019   On exertion  . GERD (gastroesophageal reflux disease)   . Hemochromatosis   . History of hiatal hernia   . Hypertension   . Iron deficiency anemia due to chronic blood loss 03/10/2017  . PE (pulmonary embolism) 2007   x2  . Peripheral vascular disease (HCC)    DVT Rt leg  . Pneumonia 1990s   "walking" pneumonia  . Prothrombin gene mutation ([place]) 05/30/2013  . Restless legs   . Right leg DVT ([place]) 05/30/2013  . Stroke [facility])    found on a MRI, she's not aware otherwise    Past Surgical History:  Procedure Laterality Date  . BACK SURGERY    . CARDIAC CATHETERIZATION Bilateral 2005  . COLONOSCOPY    . IVC FILTER INSERTION N/A 03/12/2020   Procedure: IVC FILTER INSERTION;  Surgeon: Waynetta Sandy, MD;  Location: [place];  Service: Cardiovascular;  Laterality: N/A;  . RIGHT/LEFT HEART CATH AND CORONARY ANGIOGRAPHY N/A 07/12/2019   Procedure: RIGHT/LEFT HEART CATH AND CORONARY ANGIOGRAPHY;  Surgeon:  Troy Sine, MD;  Location: [place] [facility];  Service: Cardiovascular;  Laterality: N/A;  . SHOULDER SURGERY Bilateral   . TOTAL KNEE ARTHROPLASTY Right 03/16/2020   Procedure: TOTAL KNEE ARTHROPLASTY;  Surgeon: Susa Day, MD;  [facility];  Service: Orthopedics;  Laterality: Right;  2.5 hrs    There were no vitals filed for this visit.   Subjective Assessment - 04/11/20 0937    Subjective COVID-19 screen performed prior to patient entering clinic.  No new complaints.    Pertinent History HTN, CHF, Chronic kidney disease, sulfa drug allergy, IVC filter, history of shoulder surgery, L foot drop after lumbar surgery    Limitations Standing;Sitting;Walking;House hold activities    How long can you stand comfortably? short periods    How long can you walk comfortably? between rooms    Diagnostic tests x-ray    Patient Stated Goals walk with rollator    Pain Score 3     Pain Location Knee    Pain Orientation Left    Pain Descriptors / Indicators Discomfort    Pain Type Surgical pain    Pain Onset 1 to 4 weeks ago              [facility] PT Assessment - 04/11/20 0001      PROM   Right Knee Flexion  104                         [facility] Adult PT Treatment/Exercise - 04/11/20 0001      Exercises   Exercises Knee/Hip      Knee/Hip Exercises: Aerobic   Nustep 15 minutes.      Modalities   Modalities Electrical engineer Stimulation Location Right knee.    Electrical Stimulation Action IFC at 80-150 Hz at 40% scan x 20 minutes.      Manual Therapy   Manual Therapy Passive ROM    Passive ROM In supine:  PROM into right knee flexion and extension with low load long duration streching utilized x 8 minutes.                       PT Long Term Goals - 04/06/20 6629      PT LONG TERM GOAL #1   Title Patient will be independent with HEP and its progression.    Time 4    Period Weeks    Status  On-going      PT LONG TERM GOAL #2   Title Patient will demonstrate 5 degrees or less of R knee extension AROM to improve gait mechanics.    Baseline 8 degrees R knee extension AROM    Time 4    Period Weeks    Status On-going      PT LONG TERM GOAL #3   Title Patient will demonstrate 115+ degrees of right knee flexion AROM to improve ability to perform home activities.    Time 4    Period Weeks    Status On-going      PT LONG TERM GOAL #4   Title Patient will report ability to perform ADLs independently with R knee pain less than or equal to 3/10.    Time 4    Period Weeks    Status On-going      PT LONG TERM GOAL #5   Title Patient will ambulate with rollator with step through gait pattern to safely walk in home and around community.    Time 4    Period Weeks    Status On-going                 Plan - 04/11/20 0951    Clinical Impression Statement The patient did very well today with passive right knee flexion to 104 degrees today.    Personal Factors and Comorbidities Comorbidity 3+    Comorbidities R TKA 03/16/2020,  HTN, CHF, Chronic kidney disease, sulfa drug allergy, IVC filter, history of shoulder surgery, L foot drop after lumbar surgery    Examination-Activity Limitations Bed Mobility;Locomotion Level;Transfers;Sleep;Stairs;Stand;Dressing    Stability/Clinical Decision Making Stable/Uncomplicated    Rehab Potential Good    PT Frequency 3x / week    PT Duration 4 weeks    PT Treatment/Interventions ADLs/Self Care Home Management;Cryotherapy;Electrical Stimulation;Gait training;Stair training;Functional mobility training;Therapeutic activities;Therapeutic exercise;Balance training;Neuromuscular re-education;Manual techniques;Passive range of motion;Patient/family education;Vasopneumatic Device    PT Next Visit Plan nustep, right knee AROM and PROM, gait training to return to ambulation with rollator, VASO WITH NO COMPRESSION.    PT Home Exercise Plan continue HEP  provided by surgeon    Consulted and Agree with Plan of Care Patient           Patient will benefit from skilled therapeutic intervention in order to improve  the following deficits and impairments:  Abnormal gait,Decreased balance,Decreased strength,Increased edema,Pain,Decreased range of motion,Difficulty walking  Visit Diagnosis: Acute pain of right knee  Stiffness of right knee, not elsewhere classified  Localized edema  Difficulty in walking, not elsewhere classified     Problem List Patient Active Problem List   Diagnosis Date Noted  . S/P TKR (total knee replacement) using cement 03/16/2020  . Hyperlipidemia 01/31/2020  . AKI (acute kidney injury) ([place]) 09/15/2019  . Right flank pain 09/15/2019  . Anemia 09/15/2019  . Abnormal nuclear cardiac imaging test   . CHF (congestive heart failure) ([place]) 07/09/2019  . Dyspnea 07/09/2019  . Respiratory failure with hypoxia ([place]) 07/09/2019  . Shoulder pain 07/09/2019  . Dyspnea on exertion 06/03/2019  . Bilateral lower extremity edema 06/03/2019  . Left bundle branch block 06/03/2019  . Iron deficiency anemia due to chronic blood loss 03/10/2017  . Postoperative wound dehiscence 12/02/2016  . E. coli UTI   . Abnormal urinalysis   . Medication side effect   . Neuropathic pain   . History of DVT (deep vein thrombosis)   . HTN (hypertension)   . RLS (restless legs syndrome)   . Urinary retention   . Hypokalemia   . Hypoalbuminemia due to protein-calorie malnutrition ([place])   . Acute blood loss anemia   . Lumbar stenosis with neurogenic claudication 11/18/2016  . Spondylolisthesis of lumbar region 11/12/2016  . Hereditary hemochromatosis ([place]) 07/28/2016  . Prothrombin gene mutation ([place]) 05/30/2013  . Right leg DVT ([employer]) 05/30/2013  . Hemochromatosis 07/29/2012    Basil Blakesley, [place] MPT 04/11/2020, 9:54 AM  [facility] [address] [place], [place], [address] Phone:  [phone]   Fax:  [phone]  Name: [name] MRN: [id number] Date of Birth: 1941-03-12

## 2020-04-13 ENCOUNTER — Ambulatory Visit: Payer: PPO | Admitting: *Deleted

## 2020-04-13 ENCOUNTER — Other Ambulatory Visit: Payer: Self-pay

## 2020-04-13 DIAGNOSIS — M25561 Pain in right knee: Secondary | ICD-10-CM

## 2020-04-13 DIAGNOSIS — R262 Difficulty in walking, not elsewhere classified: Secondary | ICD-10-CM

## 2020-04-13 DIAGNOSIS — M25661 Stiffness of right knee, not elsewhere classified: Secondary | ICD-10-CM

## 2020-04-13 DIAGNOSIS — R600 Localized edema: Secondary | ICD-10-CM

## 2020-04-13 NOTE — Therapy (Signed)
[facility] [place], [place], [address] Phone: [phone]   Fax:  [phone]  Physical Therapy Treatment  Patient Details  Name: [name] MRN: [id number] Date of Birth: Jul 26, 1940 Referring Provider (PT): Susa Day, MD   Encounter Date: 04/13/2020   PT End of Session - 04/13/20 9833    Visit Number 5    Number of Visits 12    Date for PT Re-Evaluation 05/09/20    Authorization Type [employer] (CQ modifier) FOTO; progress note every 10th visit    PT Start Time 0815    PT Stop Time 0905    PT Time Calculation (min) 50 min           Past Medical History:  Diagnosis Date  . Anemia   . Arthritis    osteoarthritis Shoulder,neck  . Bell's palsy 1980s  . CHF (congestive heart failure) ([place]) 06/2019  . Chronic kidney disease    Stage III  . Dyspnea 06/2019   On exertion  . GERD (gastroesophageal reflux disease)   . Hemochromatosis   . History of hiatal hernia   . Hypertension   . Iron deficiency anemia due to chronic blood loss 03/10/2017  . PE (pulmonary embolism) 2007   x2  . Peripheral vascular disease (HCC)    DVT Rt leg  . Pneumonia 1990s   "walking" pneumonia  . Prothrombin gene mutation ([place]) 05/30/2013  . Restless legs   . Right leg DVT ([place]) 05/30/2013  . Stroke [facility])    found on a MRI, she's not aware otherwise    Past Surgical History:  Procedure Laterality Date  . BACK SURGERY    . CARDIAC CATHETERIZATION Bilateral 2005  . COLONOSCOPY    . IVC FILTER INSERTION N/A 03/12/2020   Procedure: IVC FILTER INSERTION;  Surgeon: Waynetta Sandy, MD;  Location: [facility];  Service: Cardiovascular;  Laterality: N/A;  . RIGHT/LEFT HEART CATH AND CORONARY ANGIOGRAPHY N/A 07/12/2019   Procedure: RIGHT/LEFT HEART CATH AND CORONARY ANGIOGRAPHY;  Surgeon: Troy Sine, MD;  Location: [facility];  Service: Cardiovascular;  Laterality: N/A;  . SHOULDER SURGERY  Bilateral   . TOTAL KNEE ARTHROPLASTY Right 03/16/2020   Procedure: TOTAL KNEE ARTHROPLASTY;  Surgeon: Susa Day, MD;  Location: [facility];  Service: Orthopedics;  Laterality: Right;  2.5 hrs    There were no vitals filed for this visit.   Subjective Assessment - 04/13/20 0821    Subjective COVID-19 screen performed prior to patient entering clinic.  No new complaints.Doing better.    Pertinent History HTN, CHF, Chronic kidney disease, sulfa drug allergy, IVC filter, history of shoulder surgery, L foot drop after lumbar surgery    How long can you stand comfortably? short periods    How long can you walk comfortably? between rooms    Patient Stated Goals walk with rollator    Currently in Pain? Yes    Pain Score 3     Pain Location Knee    Pain Orientation Left    Pain Descriptors / Indicators Discomfort    Pain Type Surgical pain    Pain Onset 1 to 4 weeks ago    Pain Frequency Constant                             [facility] Adult PT Treatment/Exercise - 04/13/20 0001      Exercises   Exercises Knee/Hip  Knee/Hip Exercises: Aerobic   Nustep L2-3, seat  9,8, 7  x 15 min      Knee/Hip Exercises: Standing   Heel Raises Both;3 sets;10 reps    Heel Raises Limitations toe raises 3x10    Hip Flexion Right;2 sets;10 reps      Modalities   Modalities Electrical Stimulation      Electrical Stimulation   Electrical Stimulation Location Right knee.    Electrical Stimulation Action IFC x15 mins 80-150hz     Electrical Stimulation Goals Pain      Vasopneumatic   Number Minutes Vasopneumatic  10 minutes    Vasopnuematic Location  Knee    Vasopneumatic Pressure --   no pressure   Vasopneumatic Temperature  34 for pain and edema      Manual Therapy   Manual Therapy Passive ROM    Manual therapy comments PROM flexion 107 degrees    Passive ROM In supine:  PROM into right knee flexion and extension with low load long duration streching utilized                        PT Long Term Goals - 04/06/20 0858      PT LONG TERM GOAL #1   Title Patient will be independent with HEP and its progression.    Time 4    Period Weeks    Status On-going      PT LONG TERM GOAL #2   Title Patient will demonstrate 5 degrees or less of R knee extension AROM to improve gait mechanics.    Baseline 8 degrees R knee extension AROM    Time 4    Period Weeks    Status On-going      PT LONG TERM GOAL #3   Title Patient will demonstrate 115+ degrees of right knee flexion AROM to improve ability to perform home activities.    Time 4    Period Weeks    Status On-going      PT LONG TERM GOAL #4   Title Patient will report ability to perform ADLs independently with R knee pain less than or equal to 3/10.    Time 4    Period Weeks    Status On-going      PT LONG TERM GOAL #5   Title Patient will ambulate with rollator with step through gait pattern to safely walk in home and around community.    Time 4    Period Weeks    Status On-going                 Plan - 04/13/20 3220    Clinical Impression Statement Pt arrived today doing fairly well with RT kneee. She is using FWW walker for ambulation and will try her rollator at home. Rx focused on standing exs as well as PROM. She was able to reach 107 degrees flexion today. VASO tolerated well with NO Pressure    Comorbidities R TKA 03/16/2020,  HTN, CHF, Chronic kidney disease, sulfa drug allergy, IVC filter, history of shoulder surgery, L foot drop after lumbar surgery    Examination-Activity Limitations Bed Mobility;Locomotion Level;Transfers;Sleep;Stairs;Stand;Dressing    Stability/Clinical Decision Making Stable/Uncomplicated    Rehab Potential Good    PT Frequency 3x / week    PT Duration 4 weeks    PT Treatment/Interventions ADLs/Self Care Home Management;Cryotherapy;Electrical Stimulation;Gait training;Stair training;Functional mobility training;Therapeutic activities;Therapeutic  exercise;Balance training;Neuromuscular re-education;Manual techniques;Passive range of motion;Patient/family education;Vasopneumatic Device    PT Next  Visit Plan nustep, right knee AROM and PROM, gait training to return to ambulation with rollator, VASO WITH NO COMPRESSION.    PT Home Exercise Plan continue HEP provided by surgeon    Consulted and Agree with Plan of Care Patient           Patient will benefit from skilled therapeutic intervention in order to improve the following deficits and impairments:  Abnormal gait,Decreased balance,Decreased strength,Increased edema,Pain,Decreased range of motion,Difficulty walking  Visit Diagnosis: Acute pain of right knee  Stiffness of right knee, not elsewhere classified  Localized edema  Difficulty in walking, not elsewhere classified     Problem List Patient Active Problem List   Diagnosis Date Noted  . S/P TKR (total knee replacement) using cement 03/16/2020  . Hyperlipidemia 01/31/2020  . AKI (acute kidney injury) ([place]) 09/15/2019  . Right flank pain 09/15/2019  . Anemia 09/15/2019  . Abnormal nuclear cardiac imaging test   . CHF (congestive heart failure) ([place]) 07/09/2019  . Dyspnea 07/09/2019  . Respiratory failure with hypoxia ([place]) 07/09/2019  . Shoulder pain 07/09/2019  . Dyspnea on exertion 06/03/2019  . Bilateral lower extremity edema 06/03/2019  . Left bundle branch block 06/03/2019  . Iron deficiency anemia due to chronic blood loss 03/10/2017  . Postoperative wound dehiscence 12/02/2016  . E. coli UTI   . Abnormal urinalysis   . Medication side effect   . Neuropathic pain   . History of DVT (deep vein thrombosis)   . HTN (hypertension)   . RLS (restless legs syndrome)   . Urinary retention   . Hypokalemia   . Hypoalbuminemia due to protein-calorie malnutrition ([place])   . Acute blood loss anemia   . Lumbar stenosis with neurogenic claudication 11/18/2016  . Spondylolisthesis of lumbar region 11/12/2016  .  Hereditary hemochromatosis ([place]) 07/28/2016  . Prothrombin gene mutation ([place]) 05/30/2013  . Right leg DVT ([place]) 05/30/2013  . Hemochromatosis 07/29/2012    Dallin Mccorkel,CHRIS, PTA 04/13/2020, 9:06 AM  [facility] [place], [place], [address] Phone: [phone]   Fax:  [phone]  Name: [name] MRN: [id number] Date of Birth: 08-25-1940

## 2020-04-16 ENCOUNTER — Other Ambulatory Visit: Payer: Self-pay

## 2020-04-16 ENCOUNTER — Ambulatory Visit: Payer: PPO | Admitting: *Deleted

## 2020-04-16 DIAGNOSIS — R262 Difficulty in walking, not elsewhere classified: Secondary | ICD-10-CM

## 2020-04-16 DIAGNOSIS — M25661 Stiffness of right knee, not elsewhere classified: Secondary | ICD-10-CM

## 2020-04-16 DIAGNOSIS — R600 Localized edema: Secondary | ICD-10-CM

## 2020-04-16 DIAGNOSIS — M25561 Pain in right knee: Secondary | ICD-10-CM

## 2020-04-16 NOTE — Therapy (Signed)
[facility] [place], [place], [address] Phone: [phone]   Fax:  [phone]  Physical Therapy Treatment  Patient Details  Name: [name] MRN: [id number] Date of Birth: 11-04-1940 Referring Provider (PT): Susa Day, MD   Encounter Date: 04/16/2020   PT End of Session - 04/16/20 1044    Visit Number 6    Number of Visits 12    Date for PT Re-Evaluation 05/09/20    Authorization Type [employer] (CQ modifier) FOTO; progress note every 10th visit    PT Start Time 1039    PT Stop Time 1138    PT Time Calculation (min) 59 min           Past Medical History:  Diagnosis Date  . Anemia   . Arthritis    osteoarthritis Shoulder,neck  . Bell's palsy 1980s  . CHF (congestive heart failure) ([place]) 06/2019  . Chronic kidney disease    Stage III  . Dyspnea 06/2019   On exertion  . GERD (gastroesophageal reflux disease)   . Hemochromatosis   . History of hiatal hernia   . Hypertension   . Iron deficiency anemia due to chronic blood loss 03/10/2017  . PE (pulmonary embolism) 2007   x2  . Peripheral vascular disease (HCC)    DVT Rt leg  . Pneumonia 1990s   "walking" pneumonia  . Prothrombin gene mutation ([place]) 05/30/2013  . Restless legs   . Right leg DVT ([place]) 05/30/2013  . Stroke [facility])    found on a MRI, she's not aware otherwise    Past Surgical History:  Procedure Laterality Date  . BACK SURGERY    . CARDIAC CATHETERIZATION Bilateral 2005  . COLONOSCOPY    . IVC FILTER INSERTION N/A 03/12/2020   Procedure: IVC FILTER INSERTION;  Surgeon: Waynetta Sandy, MD;  Location: [facility];  Service: Cardiovascular;  Laterality: N/A;  . RIGHT/LEFT HEART CATH AND CORONARY ANGIOGRAPHY N/A 07/12/2019   Procedure: RIGHT/LEFT HEART CATH AND CORONARY ANGIOGRAPHY;  Surgeon: Troy Sine, MD;  Location: [facility];  Service: Cardiovascular;  Laterality: N/A;  . SHOULDER SURGERY  Bilateral   . TOTAL KNEE ARTHROPLASTY Right 03/16/2020   Procedure: TOTAL KNEE ARTHROPLASTY;  Surgeon: Susa Day, MD;  Location[facility];  Service: Orthopedics;  Laterality: Right;  2.5 hrs    There were no vitals filed for this visit.   Subjective Assessment - 04/16/20 1043    Subjective COVID-19 screen performed prior to patient entering clinic. Rt knee is sore after getting out of the truck    Pertinent History HTN, CHF, Chronic kidney disease, sulfa drug allergy, IVC filter, history of shoulder surgery, L foot drop after lumbar surgery    How long can you stand comfortably? short periods    How long can you walk comfortably? between rooms    Diagnostic tests x-ray    Patient Stated Goals walk with rollator    Currently in Pain? Yes    Pain Score 2     Pain Location Knee    Pain Orientation Left    Pain Descriptors / Indicators Discomfort    Pain Type Surgical pain    Pain Onset 1 to 4 weeks ago                             [facility] Adult PT Treatment/Exercise - 04/16/20 0001      Exercises  Exercises Knee/Hip      Knee/Hip Exercises: Aerobic   Nustep L2-3, seat  9,8, 7 ,6 x 15 min      Knee/Hip Exercises: Standing   Heel Raises Both;3 sets;10 reps    Heel Raises Limitations toe raises 3x10    Knee Flexion Right;2 sets;10 reps    Hip Flexion Right;2 sets;10 reps    Forward Step Up 2 sets;Right;10 reps;Step Height: 6";Hand Hold: 2      Modalities   Modalities Electrical engineer Stimulation Location Right knee.    Electrical Stimulation Action IFC x10 min 80-150hz     Electrical Stimulation Goals Pain      Vasopneumatic   Number Minutes Vasopneumatic  10 minutes    Vasopnuematic Location  Knee    Vasopneumatic Pressure Low    Vasopneumatic Temperature  34 for pain and edema      Manual Therapy   Manual Therapy Passive ROM    Passive ROM Sitting   PROM / AAROM for right knee flexion and supine  AAROM/PROM for  extension with low load long duration streching utilized                       PT Long Term Goals - 04/06/20 0858      PT LONG TERM GOAL #1   Title Patient will be independent with HEP and its progression.    Time 4    Period Weeks    Status On-going      PT LONG TERM GOAL #2   Title Patient will demonstrate 5 degrees or less of R knee extension AROM to improve gait mechanics.    Baseline 8 degrees R knee extension AROM    Time 4    Period Weeks    Status On-going      PT LONG TERM GOAL #3   Title Patient will demonstrate 115+ degrees of right knee flexion AROM to improve ability to perform home activities.    Time 4    Period Weeks    Status On-going      PT LONG TERM GOAL #4   Title Patient will report ability to perform ADLs independently with R knee pain less than or equal to 3/10.    Time 4    Period Weeks    Status On-going      PT LONG TERM GOAL #5   Title Patient will ambulate with rollator with step through gait pattern to safely walk in home and around community.    Time 4    Period Weeks    Status On-going                 Plan - 04/16/20 1053    Clinical Impression Statement Pt arrived today doing very well with RT knee and continues to progress. She is ambulating with a rollator today and did well. Rx focused on ROM and strengthening. Vaso no pressure tolerated well with estim.    Personal Factors and Comorbidities Comorbidity 3+    Comorbidities R TKA 03/16/2020,  HTN, CHF, Chronic kidney disease, sulfa drug allergy, IVC filter, history of shoulder surgery, L foot drop after lumbar surgery    Examination-Activity Limitations Bed Mobility;Locomotion Level;Transfers;Sleep;Stairs;Stand;Dressing    Rehab Potential Good    PT Frequency 3x / week    PT Duration 4 weeks    PT Treatment/Interventions ADLs/Self Care Home Management;Cryotherapy;Electrical Stimulation;Gait training;Stair training;Functional mobility  training;Therapeutic activities;Therapeutic exercise;Balance training;Neuromuscular  re-education;Manual techniques;Passive range of motion;Patient/family education;Vasopneumatic Device    PT Next Visit Plan nustep, right knee AROM and PROM, gait training to return to ambulation with rollator, VASO WITH NO COMPRESSION.    PT Home Exercise Plan continue HEP provided by surgeon           Patient will benefit from skilled therapeutic intervention in order to improve the following deficits and impairments:  Abnormal gait,Decreased balance,Decreased strength,Increased edema,Pain,Decreased range of motion,Difficulty walking  Visit Diagnosis: Acute pain of right knee  Stiffness of right knee, not elsewhere classified  Localized edema  Difficulty in walking, not elsewhere classified     Problem List Patient Active Problem List   Diagnosis Date Noted  . S/P TKR (total knee replacement) using cement 03/16/2020  . Hyperlipidemia 01/31/2020  . AKI (acute kidney injury) ([place]) 09/15/2019  . Right flank pain 09/15/2019  . Anemia 09/15/2019  . Abnormal nuclear cardiac imaging test   . CHF (congestive heart failure) ([place]) 07/09/2019  . Dyspnea 07/09/2019  . Respiratory failure with hypoxia ([place]) 07/09/2019  . Shoulder pain 07/09/2019  . Dyspnea on exertion 06/03/2019  . Bilateral lower extremity edema 06/03/2019  . Left bundle branch block 06/03/2019  . Iron deficiency anemia due to chronic blood loss 03/10/2017  . Postoperative wound dehiscence 12/02/2016  . E. coli UTI   . Abnormal urinalysis   . Medication side effect   . Neuropathic pain   . History of DVT (deep vein thrombosis)   . HTN (hypertension)   . RLS (restless legs syndrome)   . Urinary retention   . Hypokalemia   . Hypoalbuminemia due to protein-calorie malnutrition ([place])   . Acute blood loss anemia   . Lumbar stenosis with neurogenic claudication 11/18/2016  . Spondylolisthesis of lumbar region 11/12/2016  .  Hereditary hemochromatosis ([place]) 07/28/2016  . Prothrombin gene mutation ([place]) 05/30/2013  . Right leg DVT ([place]) 05/30/2013  . Hemochromatosis 07/29/2012    Ranferi Clingan,CHRIS, PTA 04/16/2020, 12:03 PM  [facility] [address] [place], [place], [address] Phone: [phone]   Fax:  [phone]  Name: [name] MRN: [id number] Date of Birth: 07/12/1940

## 2020-04-18 ENCOUNTER — Encounter: Payer: PPO | Admitting: *Deleted

## 2020-04-18 DIAGNOSIS — Z7952 Long term (current) use of systemic steroids: Secondary | ICD-10-CM | POA: Diagnosis not present

## 2020-04-18 DIAGNOSIS — M542 Cervicalgia: Secondary | ICD-10-CM | POA: Diagnosis not present

## 2020-04-18 DIAGNOSIS — M25519 Pain in unspecified shoulder: Secondary | ICD-10-CM | POA: Diagnosis not present

## 2020-04-18 DIAGNOSIS — M549 Dorsalgia, unspecified: Secondary | ICD-10-CM | POA: Diagnosis not present

## 2020-04-18 DIAGNOSIS — M25562 Pain in left knee: Secondary | ICD-10-CM | POA: Diagnosis not present

## 2020-04-19 ENCOUNTER — Ambulatory Visit: Payer: PPO | Admitting: Physical Therapy

## 2020-04-19 ENCOUNTER — Other Ambulatory Visit: Payer: Self-pay

## 2020-04-19 DIAGNOSIS — M25661 Stiffness of right knee, not elsewhere classified: Secondary | ICD-10-CM

## 2020-04-19 DIAGNOSIS — M25561 Pain in right knee: Secondary | ICD-10-CM

## 2020-04-19 DIAGNOSIS — R600 Localized edema: Secondary | ICD-10-CM

## 2020-04-19 DIAGNOSIS — R262 Difficulty in walking, not elsewhere classified: Secondary | ICD-10-CM

## 2020-04-19 NOTE — Therapy (Signed)
[facility] [place], [place], [address] Phone: [phone]   Fax:  [phone]  Physical Therapy Treatment  Patient Details  Name: [name] MRN: [id number] Date of Birth: 1940/11/21 Referring Provider (PT): Susa Day, MD   Encounter Date: 04/19/2020   PT End of Session - 04/19/20 1205    Visit Number 7    Number of Visits 12    Date for PT Re-Evaluation 05/09/20    Authorization Type [employer] (CQ modifier) FOTO; progress note every 10th visit    PT Start Time 1202    PT Stop Time 1247    PT Time Calculation (min) 45 min    Activity Tolerance Patient tolerated treatment well    Behavior During Therapy [facility] for tasks assessed/performed           Past Medical History:  Diagnosis Date  . Anemia   . Arthritis    osteoarthritis Shoulder,neck  . Bell's palsy 1980s  . CHF (congestive heart failure) ([place]) 06/2019  . Chronic kidney disease    Stage III  . Dyspnea 06/2019   On exertion  . GERD (gastroesophageal reflux disease)   . Hemochromatosis   . History of hiatal hernia   . Hypertension   . Iron deficiency anemia due to chronic blood loss 03/10/2017  . PE (pulmonary embolism) 2007   x2  . Peripheral vascular disease (HCC)    DVT Rt leg  . Pneumonia 1990s   "walking" pneumonia  . Prothrombin gene mutation ([place]) 05/30/2013  . Restless legs   . Right leg DVT ([place]) 05/30/2013  . Stroke [facility])    found on a MRI, she's not aware otherwise    Past Surgical History:  Procedure Laterality Date  . BACK SURGERY    . CARDIAC CATHETERIZATION Bilateral 2005  . COLONOSCOPY    . IVC FILTER INSERTION N/A 03/12/2020   Procedure: IVC FILTER INSERTION;  Surgeon: Waynetta Sandy, MD;  Location: [place] [facility];  Service: Cardiovascular;  Laterality: N/A;  . RIGHT/LEFT HEART CATH AND CORONARY ANGIOGRAPHY N/A 07/12/2019   Procedure: RIGHT/LEFT HEART CATH AND CORONARY ANGIOGRAPHY;  Surgeon:  Troy Sine, MD;  Location: [place] [facility];  Service: Cardiovascular;  Laterality: N/A;  . SHOULDER SURGERY Bilateral   . TOTAL KNEE ARTHROPLASTY Right 03/16/2020   Procedure: TOTAL KNEE ARTHROPLASTY;  Surgeon: Susa Day, MD;  Location: [facility];  Service: Orthopedics;  Laterality: Right;  2.5 hrs    There were no vitals filed for this visit.   Subjective Assessment - 04/19/20 1204    Subjective COVID-19 screen performed prior to patient entering clinic. Patient arrived and reported "knee is doing good today"    Pertinent History HTN, CHF, Chronic kidney disease, sulfa drug allergy, IVC filter, history of shoulder surgery, L foot drop after lumbar surgery    Limitations Standing;Sitting;Walking;House hold activities    How long can you stand comfortably? short periods    How long can you walk comfortably? between rooms    Diagnostic tests x-ray    Patient Stated Goals walk with rollator    Currently in Pain? Yes    Pain Score 2     Pain Location Knee    Pain Orientation Left    Pain Descriptors / Indicators Discomfort    Pain Type Surgical pain    Pain Onset 1 to 4 weeks ago    Pain Frequency Constant    Aggravating Factors  proong activity    Pain  Relieving Factors rest              [facility] PT Assessment - 04/19/20 0001      AROM   AROM Assessment Site Knee    Right/Left Knee Right    Right Knee Extension -6    Right Knee Flexion 97      PROM   PROM Assessment Site Knee    Right/Left Knee Right    Right Knee Extension -4    Right Knee Flexion 108                         [facility] Adult PT Treatment/Exercise - 04/19/20 0001      Knee/Hip Exercises: Aerobic   Nustep L3 x 27min adjusted for ROM      Knee/Hip Exercises: Standing   Forward Step Up 2 sets;Right;10 reps;Step Height: 6";Hand Hold: 2      Knee/Hip Exercises: Supine   Straight Leg Raises Strengthening;Right;2 sets;10 reps    Other Supine Knee/Hip Exercises clamshell with red t-band  x20      Electrical Stimulation   Electrical Stimulation Location Right knee.    Electrical Stimulation Action IFC 80-150hz  x21min    Electrical Stimulation Goals Pain      Vasopneumatic   Number Minutes Vasopneumatic  10 minutes    Vasopnuematic Location  Knee    Vasopneumatic Pressure Low    Vasopneumatic Temperature  34 for pain and edema      Manual Therapy   Manual Therapy Passive ROM    Passive ROM manual PROM for right knee flexion and ext to improve mobility                       PT Long Term Goals - 04/19/20 1206      PT LONG TERM GOAL #1   Title Patient will be independent with HEP and its progression.    Time 4    Period Weeks    Status On-going      PT LONG TERM GOAL #2   Title Patient will demonstrate 5 degrees or less of R knee extension AROM to improve gait mechanics.    Baseline AROM -6 degrees 04/19/20    Time 4    Period Weeks    Status On-going      PT LONG TERM GOAL #3   Title Patient will demonstrate 115+ degrees of right knee flexion AROM to improve ability to perform home activities.    Baseline AROM 97 degrees 04/19/20    Time 4    Period Weeks    Status On-going      PT LONG TERM GOAL #4   Title Patient will report ability to perform ADLs independently with R knee pain less than or equal to 3/10.    Time 4    Period Weeks    Status On-going      PT LONG TERM GOAL #5   Title Patient will ambulate with rollator with step through gait pattern to safely walk in home and around community.    Time 4    Period Weeks    Status On-going                 Plan - 04/19/20 1238    Clinical Impression Statement Patient tolerated treatment well today. Patient progressing with ROM for right knee. Patient reported pain level varies depending on the day and activity level. Patient educated on self stretches daily to  maintain and improve mobility. Goals progressing this week.    Personal Factors and Comorbidities Comorbidity 3+     Comorbidities R TKA 03/16/2020,  HTN, CHF, Chronic kidney disease, sulfa drug allergy, IVC filter, history of shoulder surgery, L foot drop after lumbar surgery    Examination-Activity Limitations Bed Mobility;Locomotion Level;Transfers;Sleep;Stairs;Stand;Dressing    Stability/Clinical Decision Making Stable/Uncomplicated    Rehab Potential Good    PT Frequency 3x / week    PT Duration 4 weeks    PT Treatment/Interventions ADLs/Self Care Home Management;Cryotherapy;Electrical Stimulation;Gait training;Stair training;Functional mobility training;Therapeutic activities;Therapeutic exercise;Balance training;Neuromuscular re-education;Manual techniques;Passive range of motion;Patient/family education;Vasopneumatic Device    PT Next Visit Plan cont with nustep, right knee AROM and PROM, gait training to return to ambulation with rollator, VASO WITH NO COMPRESSION.    Consulted and Agree with Plan of Care Patient           Patient will benefit from skilled therapeutic intervention in order to improve the following deficits and impairments:  Abnormal gait,Decreased balance,Decreased strength,Increased edema,Pain,Decreased range of motion,Difficulty walking  Visit Diagnosis: Acute pain of right knee  Stiffness of right knee, not elsewhere classified  Localized edema  Difficulty in walking, not elsewhere classified     Problem List Patient Active Problem List   Diagnosis Date Noted  . S/P TKR (total knee replacement) using cement 03/16/2020  . Hyperlipidemia 01/31/2020  . AKI (acute kidney injury) ([place]) 09/15/2019  . Right flank pain 09/15/2019  . Anemia 09/15/2019  . Abnormal nuclear cardiac imaging test   . CHF (congestive heart failure) ([place]) 07/09/2019  . Dyspnea 07/09/2019  . Respiratory failure with hypoxia ([place]) 07/09/2019  . Shoulder pain 07/09/2019  . Dyspnea on exertion 06/03/2019  . Bilateral lower extremity edema 06/03/2019  . Left bundle branch block 06/03/2019  . Iron  deficiency anemia due to chronic blood loss 03/10/2017  . Postoperative wound dehiscence 12/02/2016  . E. coli UTI   . Abnormal urinalysis   . Medication side effect   . Neuropathic pain   . History of DVT (deep vein thrombosis)   . HTN (hypertension)   . RLS (restless legs syndrome)   . Urinary retention   . Hypokalemia   . Hypoalbuminemia due to protein-calorie malnutrition ([place])   . Acute blood loss anemia   . Lumbar stenosis with neurogenic claudication 11/18/2016  . Spondylolisthesis of lumbar region 11/12/2016  . Hereditary hemochromatosis ([place]) 07/28/2016  . Prothrombin gene mutation ([place]) 05/30/2013  . Right leg DVT ([place]) 05/30/2013  . Hemochromatosis 07/29/2012    Phillips Climes, PTA 04/19/2020, 12:50 PM  [facility] [address] [place], [place], [address] Phone: [phone]   Fax:  [phone]  Name: [name] MRN: [id number] Date of Birth: 21-Jan-1941

## 2020-04-23 ENCOUNTER — Other Ambulatory Visit: Payer: Self-pay

## 2020-04-23 ENCOUNTER — Ambulatory Visit: Payer: PPO | Admitting: Physical Therapy

## 2020-04-23 DIAGNOSIS — M25561 Pain in right knee: Secondary | ICD-10-CM

## 2020-04-23 DIAGNOSIS — M25661 Stiffness of right knee, not elsewhere classified: Secondary | ICD-10-CM

## 2020-04-23 DIAGNOSIS — R600 Localized edema: Secondary | ICD-10-CM

## 2020-04-23 DIAGNOSIS — R262 Difficulty in walking, not elsewhere classified: Secondary | ICD-10-CM

## 2020-04-23 NOTE — Therapy (Signed)
[facility] [place], [place], [address] Phone: [phone]   Fax:  [phone]  Physical Therapy Treatment  Patient Details  Name: [name] MRN: [id number] Date of Birth: 1940/05/27 Referring Provider (PT): Susa Day, MD   Encounter Date: 04/23/2020   PT End of Session - 04/23/20 1349    Visit Number 8    Number of Visits 12    Date for PT Re-Evaluation 05/09/20    Authorization Type [employer] (CQ modifier) FOTO; progress note every 10th visit    PT Start Time 0146    PT Stop Time 0234    PT Time Calculation (min) 48 min    Activity Tolerance Patient tolerated treatment well    Behavior During Therapy [facility] for tasks assessed/performed           Past Medical History:  Diagnosis Date  . Anemia   . Arthritis    osteoarthritis Shoulder,neck  . Bell's palsy 1980s  . CHF (congestive heart failure) ([place]) 06/2019  . Chronic kidney disease    Stage III  . Dyspnea 06/2019   On exertion  . GERD (gastroesophageal reflux disease)   . Hemochromatosis   . History of hiatal hernia   . Hypertension   . Iron deficiency anemia due to chronic blood loss 03/10/2017  . PE (pulmonary embolism) 2007   x2  . Peripheral vascular disease (HCC)    DVT Rt leg  . Pneumonia 1990s   "walking" pneumonia  . Prothrombin gene mutation ([place]) 05/30/2013  . Restless legs   . Right leg DVT ([place]) 05/30/2013  . Stroke [facility])    found on a MRI, she's not aware otherwise    Past Surgical History:  Procedure Laterality Date  . BACK SURGERY    . CARDIAC CATHETERIZATION Bilateral 2005  . COLONOSCOPY    . IVC FILTER INSERTION N/A 03/12/2020   Procedure: IVC FILTER INSERTION;  Surgeon: Waynetta Sandy, MD;  Location: [place] [facility];  Service: Cardiovascular;  Laterality: N/A;  . RIGHT/LEFT HEART CATH AND CORONARY ANGIOGRAPHY N/A 07/12/2019   Procedure: RIGHT/LEFT HEART CATH AND CORONARY ANGIOGRAPHY;  Surgeon:  Troy Sine, MD;  Location: [place] [facility];  Service: Cardiovascular;  Laterality: N/A;  . SHOULDER SURGERY Bilateral   . TOTAL KNEE ARTHROPLASTY Right 03/16/2020   Procedure: TOTAL KNEE ARTHROPLASTY;  Surgeon: Susa Day, MD;  [facility];  Service: Orthopedics;  Laterality: Right;  2.5 hrs    There were no vitals filed for this visit.   Subjective Assessment - 04/23/20 1348    Subjective COVID-19 screen performed prior to patient entering clinic. Patient arrived and reported some minimal discomfort today    Pertinent History HTN, CHF, Chronic kidney disease, sulfa drug allergy, IVC filter, history of shoulder surgery, L foot drop after lumbar surgery    Limitations Standing;Sitting;Walking;House hold activities    How long can you stand comfortably? short periods    How long can you walk comfortably? between rooms    Diagnostic tests x-ray    Patient Stated Goals walk with rollator    Currently in Pain? Yes    Pain Score 2     Pain Location Knee    Pain Orientation Left    Pain Descriptors / Indicators Discomfort    Pain Type Surgical pain    Pain Onset More than a month ago    Pain Frequency Intermittent    Aggravating Factors  prolong activity with knee  Pain Relieving Factors at rest              [facility] PT Assessment - 04/23/20 0001      AROM   AROM Assessment Site Knee    Right/Left Knee Right    Right Knee Extension -5    Right Knee Flexion 105      PROM   PROM Assessment Site Knee    Right/Left Knee Right    Right Knee Extension -3    Right Knee Flexion 114                         [facility] Adult PT Treatment/Exercise - 04/23/20 0001      Knee/Hip Exercises: Aerobic   Recumbent Bike x36mn for ROM (seat 6)    Nustep L3-5 x 816m      Knee/Hip Exercises: Standing   Heel Raises Both;20 reps    Hip Abduction Stengthening;Right;2 sets;10 reps;Knee straight      Knee/Hip Exercises: Seated   Sit to Sand 15 reps;with UE support       Knee/Hip Exercises: Supine   Straight Leg Raises Strengthening;Right;2 sets;10 reps    Other Supine Knee/Hip Exercises clamshell with red t-band x30      Electrical Stimulation   Electrical Stimulation Location Right knee.    Electrical Stimulation Action IFC 80-'150hz'  x10 min    Electrical Stimulation Goals Pain      Vasopneumatic   Number Minutes Vasopneumatic  10 minutes    Vasopnuematic Location  Knee    Vasopneumatic Pressure Low    Vasopneumatic Temperature  34 for pain and edema      Manual Therapy   Manual Therapy Passive ROM    Passive ROM manual PROM for right knee flexion and ext to improve mobility                       PT Long Term Goals - 04/23/20 1427      PT LONG TERM GOAL #1   Title Patient will be independent with HEP and its progression.    Time 4    Period Weeks    Status On-going      PT LONG TERM GOAL #2   Title Patient will demonstrate 5 degrees or less of R knee extension AROM to improve gait mechanics.    Baseline AROM -5 degrees 04/23/20    Time 4    Period Weeks    Status Achieved      PT LONG TERM GOAL #3   Title Patient will demonstrate 115+ degrees of right knee flexion AROM to improve ability to perform home activities.    Baseline AROM 105 degrees 04/23/20    Time 4    Period Weeks    Status On-going      PT LONG TERM GOAL #4   Title Patient will report ability to perform ADLs independently with R knee pain less than or equal to 3/10.    Time 4    Period Weeks    Status On-going      PT LONG TERM GOAL #5   Title Patient will ambulate with rollator with step through gait pattern to safely walk in home and around community.    Time 4    Period Weeks    Status On-going                 Plan - 04/23/20 1424    Clinical Impression Statement Patient  tolerated treatment well today. Patient progressing with decreased pain and able to progress to bike today. Patient has improved right knee flexion and ext today.  Patient progressing with right knee strenthening exercises. Patient met goal #2 with other goals progressing. MD F/U next week    Personal Factors and Comorbidities Comorbidity 3+    Comorbidities R TKA 03/16/2020,  HTN, CHF, Chronic kidney disease, sulfa drug allergy, IVC filter, history of shoulder surgery, L foot drop after lumbar surgery    Examination-Activity Limitations Bed Mobility;Locomotion Level;Transfers;Sleep;Stairs;Stand;Dressing    Stability/Clinical Decision Making Stable/Uncomplicated    Rehab Potential Good    PT Frequency 3x / week    PT Duration 4 weeks    PT Treatment/Interventions ADLs/Self Care Home Management;Cryotherapy;Electrical Stimulation;Gait training;Stair training;Functional mobility training;Therapeutic activities;Therapeutic exercise;Balance training;Neuromuscular re-education;Manual techniques;Passive range of motion;Patient/family education;Vasopneumatic Device    PT Next Visit Plan cont with nustep/bike  right knee AROM and PROM, gait training to return to ambulation with rollator, VASO WITH NO COMPRESSION.    Consulted and Agree with Plan of Care Patient           Patient will benefit from skilled therapeutic intervention in order to improve the following deficits and impairments:  Abnormal gait,Decreased balance,Decreased strength,Increased edema,Pain,Decreased range of motion,Difficulty walking  Visit Diagnosis: Acute pain of right knee  Stiffness of right knee, not elsewhere classified  Localized edema  Difficulty in walking, not elsewhere classified     Problem List Patient Active Problem List   Diagnosis Date Noted  . S/P TKR (total knee replacement) using cement 03/16/2020  . Hyperlipidemia 01/31/2020  . AKI (acute kidney injury) ([place]) 09/15/2019  . Right flank pain 09/15/2019  . Anemia 09/15/2019  . Abnormal nuclear cardiac imaging test   . CHF (congestive heart failure) ([place]) 07/09/2019  . Dyspnea 07/09/2019  . Respiratory failure  with hypoxia ([place]) 07/09/2019  . Shoulder pain 07/09/2019  . Dyspnea on exertion 06/03/2019  . Bilateral lower extremity edema 06/03/2019  . Left bundle branch block 06/03/2019  . Iron deficiency anemia due to chronic blood loss 03/10/2017  . Postoperative wound dehiscence 12/02/2016  . E. coli UTI   . Abnormal urinalysis   . Medication side effect   . Neuropathic pain   . History of DVT (deep vein thrombosis)   . HTN (hypertension)   . RLS (restless legs syndrome)   . Urinary retention   . Hypokalemia   . Hypoalbuminemia due to protein-calorie malnutrition ([place])   . Acute blood loss anemia   . Lumbar stenosis with neurogenic claudication 11/18/2016  . Spondylolisthesis of lumbar region 11/12/2016  . Hereditary hemochromatosis ([place]) 07/28/2016  . Prothrombin gene mutation ([place]) 05/30/2013  . Right leg DVT ([place]) 05/30/2013  . Hemochromatosis 07/29/2012    Teyla Skidgel P, PTA 04/23/2020, 2:36 PM  [facility] [address] [place], [place], [address] Phone: [phone]   Fax:  [phone]  Name: [name] MRN: [id number] Date of Birth: 13-Sep-1940

## 2020-04-24 ENCOUNTER — Encounter: Payer: PPO | Admitting: Physical Therapy

## 2020-04-24 DIAGNOSIS — Z09 Encounter for follow-up examination after completed treatment for conditions other than malignant neoplasm: Secondary | ICD-10-CM | POA: Diagnosis not present

## 2020-04-24 DIAGNOSIS — Z8739 Personal history of other diseases of the musculoskeletal system and connective tissue: Secondary | ICD-10-CM | POA: Diagnosis not present

## 2020-04-25 ENCOUNTER — Other Ambulatory Visit: Payer: Self-pay

## 2020-04-25 ENCOUNTER — Ambulatory Visit: Payer: PPO | Admitting: Physical Therapy

## 2020-04-25 DIAGNOSIS — M25561 Pain in right knee: Secondary | ICD-10-CM | POA: Diagnosis not present

## 2020-04-25 DIAGNOSIS — R600 Localized edema: Secondary | ICD-10-CM

## 2020-04-25 DIAGNOSIS — R262 Difficulty in walking, not elsewhere classified: Secondary | ICD-10-CM

## 2020-04-25 DIAGNOSIS — M25661 Stiffness of right knee, not elsewhere classified: Secondary | ICD-10-CM

## 2020-04-25 NOTE — Therapy (Signed)
[facility] [place], [place], [address] Phone: [phone]   Fax:  [phone]  Physical Therapy Treatment  Patient Details  Name: [name] MRN: [id number] Date of Birth: [birth year] Referring Provider (PT): Susa Day, MD   Encounter Date: 04/25/2020   PT End of Session - 04/25/20 1221    Visit Number 9    Number of Visits 12    Date for PT Re-Evaluation 05/09/20    Authorization Type [employer] (CQ modifier) FOTO; progress note every 10th visit    PT Start Time 0945    PT Stop Time 1030    PT Time Calculation (min) 45 min    Activity Tolerance Patient tolerated treatment well    Behavior During Therapy [facility] for tasks assessed/performed           Past Medical History:  Diagnosis Date  . Anemia   . Arthritis    osteoarthritis Shoulder,neck  . Bell's palsy 1980s  . CHF (congestive heart failure) ([place]) 06/2019  . Chronic kidney disease    Stage III  . Dyspnea 06/2019   On exertion  . GERD (gastroesophageal reflux disease)   . Hemochromatosis   . History of hiatal hernia   . Hypertension   . Iron deficiency anemia due to chronic blood loss 03/10/2017  . PE (pulmonary embolism) 2007   x2  . Peripheral vascular disease (HCC)    DVT Rt leg  . Pneumonia 1990s   "walking" pneumonia  . Prothrombin gene mutation ([place]) 05/30/2013  . Restless legs   . Right leg DVT ([place]) 05/30/2013  . Stroke [facility])    found on a MRI, she's not aware otherwise    Past Surgical History:  Procedure Laterality Date  . BACK SURGERY    . CARDIAC CATHETERIZATION Bilateral 2005  . COLONOSCOPY    . IVC FILTER INSERTION N/A 03/12/2020   Procedure: IVC FILTER INSERTION;  Surgeon: Waynetta Sandy, MD;  Location: [place] [facility];  Service: Cardiovascular;  Laterality: N/A;  . RIGHT/LEFT HEART CATH AND CORONARY ANGIOGRAPHY N/A 07/12/2019   Procedure: RIGHT/LEFT HEART CATH AND CORONARY ANGIOGRAPHY;  Surgeon:  Troy Sine, MD;  Location: [place] CV LAB;  Service: Cardiovascular;  Laterality: N/A;  . SHOULDER SURGERY Bilateral   . TOTAL KNEE ARTHROPLASTY Right 03/16/2020   Procedure: TOTAL KNEE ARTHROPLASTY;  Surgeon: Susa Day, MD;  Location: [facility];  Service: Orthopedics;  Laterality: Right;  2.5 hrs    There were no vitals filed for this visit.   Subjective Assessment - 04/25/20 0956    Subjective COVID-19 screen performed prior to patient entering clinic.  Doing okay.    Pertinent History HTN, CHF, Chronic kidney disease, sulfa drug allergy, IVC filter, history of shoulder surgery, L foot drop after lumbar surgery    Limitations Standing;Sitting;Walking;House hold activities    How long can you stand comfortably? short periods    How long can you walk comfortably? between rooms    Diagnostic tests x-ray    Patient Stated Goals walk with rollator    Currently in Pain? Yes    Pain Score 2     Pain Location Knee    Pain Orientation Left    Pain Descriptors / Indicators Discomfort    Pain Type Surgical pain    Pain Onset More than a month ago              [facility] PT Assessment - 04/25/20 0001  AROM   AROM Assessment Site Knee    Right/Left Knee Right    Right Knee Extension -5    Right Knee Flexion 110      PROM   Right Knee Extension -2    Right Knee Flexion 115                         [facility] Adult PT Treatment/Exercise - 04/25/20 0001      Exercises   Exercises Knee/Hip      Knee/Hip Exercises: Aerobic   Recumbent Bike 15 minutes on seat 8.      Knee/Hip Exercises: Machines for Strengthening   Cybex Knee Extension 10# x 2 minutes.    Cybex Knee Flexion 30# x 2 minutes.      Modalities   Modalities Cryotherapy      Cryotherapy   Number Minutes Cryotherapy 15 Minutes    Cryotherapy Location --   Right knee.   Type of Cryotherapy Ice pack      Electrical Stimulation   Electrical Stimulation Location Right knee.    Electrical  Stimulation Action IFC at 80-150 Hz x 15 minutes.    Electrical Stimulation Goals Pain      Manual Therapy   Manual Therapy Passive ROM    Passive ROM In supine:  Passive right knee extension and flexion x 4 minutes.                       PT Long Term Goals - 04/23/20 1427      PT LONG TERM GOAL #1   Title Patient will be independent with HEP and its progression.    Time 4    Period Weeks    Status On-going      PT LONG TERM GOAL #2   Title Patient will demonstrate 5 degrees or less of R knee extension AROM to improve gait mechanics.    Baseline AROM -5 degrees 04/23/20    Time 4    Period Weeks    Status Achieved      PT LONG TERM GOAL #3   Title Patient will demonstrate 115+ degrees of right knee flexion AROM to improve ability to perform home activities.    Baseline AROM 105 degrees 04/23/20    Time 4    Period Weeks    Status On-going      PT LONG TERM GOAL #4   Title Patient will report ability to perform ADLs independently with R knee pain less than or equal to 3/10.    Time 4    Period Weeks    Status On-going      PT LONG TERM GOAL #5   Title Patient will ambulate with rollator with step through gait pattern to safely walk in home and around community.    Time 4    Period Weeks    Status On-going                 Plan - 04/25/20 1224    Clinical Impression Statement Excellent progress today with patient making forward revolutions on bike today and also adding resisted weight machines with excellent technique and no complaints.  Her active right knee flexion was 110 degrees and passively to 115 degrees today.    Personal Factors and Comorbidities Comorbidity 3+    Comorbidities R TKA 03/16/2020,  HTN, CHF, Chronic kidney disease, sulfa drug allergy, IVC filter, history of shoulder surgery, L foot drop after  lumbar surgery    Examination-Activity Limitations Bed Mobility;Locomotion Level;Transfers;Sleep;Stairs;Stand;Dressing     Stability/Clinical Decision Making Stable/Uncomplicated           Patient will benefit from skilled therapeutic intervention in order to improve the following deficits and impairments:  Abnormal gait,Decreased balance,Decreased strength,Increased edema,Pain,Decreased range of motion,Difficulty walking  Visit Diagnosis: Acute pain of right knee  Stiffness of right knee, not elsewhere classified  Localized edema  Difficulty in walking, not elsewhere classified     Problem List Patient Active Problem List   Diagnosis Date Noted  . S/P TKR (total knee replacement) using cement 03/16/2020  . Hyperlipidemia 01/31/2020  . AKI (acute kidney injury) (HCC) 09/15/2019  . Right flank pain 09/15/2019  . Anemia 09/15/2019  . Abnormal nuclear cardiac imaging test   . CHF (congestive heart failure) (HCC) 07/09/2019  . Dyspnea 07/09/2019  . Respiratory failure with hypoxia (HCC) 07/09/2019  . Shoulder pain 07/09/2019  . Dyspnea on exertion 06/03/2019  . Bilateral lower extremity edema 06/03/2019  . Left bundle branch block 06/03/2019  . Iron deficiency anemia due to chronic blood loss 03/10/2017  . Postoperative wound dehiscence 12/02/2016  . E. coli UTI   . Abnormal urinalysis   . Medication side effect   . Neuropathic pain   . History of DVT (deep vein thrombosis)   . HTN (hypertension)   . RLS (restless legs syndrome)   . Urinary retention   . Hypokalemia   . Hypoalbuminemia due to protein-calorie malnutrition (HCC)   . Acute blood loss anemia   . Lumbar stenosis with neurogenic claudication 11/18/2016  . Spondylolisthesis of lumbar region 11/12/2016  . Hereditary hemochromatosis (HCC) 07/28/2016  . Prothrombin gene mutation (HCC) 05/30/2013  . Right leg DVT (HCC) 05/30/2013  . Hemochromatosis 07/29/2012    Dwan Hemmelgarn, [place] MPT 04/25/2020, 12:33 PM  [facility] [address] [place], [place], [address] Phone: [phone]    Fax:  [phone]  Name: [name] MRN: [id number] Date of Birth: 1940/08/19

## 2020-04-26 ENCOUNTER — Ambulatory Visit: Payer: PPO | Admitting: Physical Therapy

## 2020-04-26 ENCOUNTER — Other Ambulatory Visit: Payer: Self-pay

## 2020-04-26 DIAGNOSIS — M25661 Stiffness of right knee, not elsewhere classified: Secondary | ICD-10-CM

## 2020-04-26 DIAGNOSIS — M25561 Pain in right knee: Secondary | ICD-10-CM | POA: Diagnosis not present

## 2020-04-26 DIAGNOSIS — R262 Difficulty in walking, not elsewhere classified: Secondary | ICD-10-CM

## 2020-04-26 DIAGNOSIS — R600 Localized edema: Secondary | ICD-10-CM

## 2020-04-26 NOTE — Therapy (Signed)
[facility] [place], [place], [address] Phone: [phone]   Fax:  [phone]  Physical Therapy Treatment  Patient Details  Name: [name] MRN: [id number] Date of Birth: 1941/04/17 Referring Provider (PT): Susa Day, MD   Encounter Date: 04/26/2020   PT End of Session - 04/26/20 0820    Visit Number 10    Number of Visits 12    Date for PT Re-Evaluation 05/09/20    Authorization Type [employer] (CQ modifier) FOTO 10th visit 35%; progress note every 10th visit    PT Start Time 0816    PT Stop Time 0902    PT Time Calculation (min) 46 min    Activity Tolerance Patient tolerated treatment well    Behavior During Therapy [facility] for tasks assessed/performed           Past Medical History:  Diagnosis Date  . Anemia   . Arthritis    osteoarthritis Shoulder,neck  . Bell's palsy 1980s  . CHF (congestive heart failure) ([place]) 06/2019  . Chronic kidney disease    Stage III  . Dyspnea 06/2019   On exertion  . GERD (gastroesophageal reflux disease)   . Hemochromatosis   . History of hiatal hernia   . Hypertension   . Iron deficiency anemia due to chronic blood loss 03/10/2017  . PE (pulmonary embolism) 2007   x2  . Peripheral vascular disease (HCC)    DVT Rt leg  . Pneumonia 1990s   "walking" pneumonia  . Prothrombin gene mutation ([place]) 05/30/2013  . Restless legs   . Right leg DVT ([place]) 05/30/2013  . Stroke [facility])    found on a MRI, she's not aware otherwise    Past Surgical History:  Procedure Laterality Date  . BACK SURGERY    . CARDIAC CATHETERIZATION Bilateral 2005  . COLONOSCOPY    . IVC FILTER INSERTION N/A 03/12/2020   Procedure: IVC FILTER INSERTION;  Surgeon: Waynetta Sandy, MD;  Location: [place] CV LAB;  Service: Cardiovascular;  Laterality: N/A;  . RIGHT/LEFT HEART CATH AND CORONARY ANGIOGRAPHY N/A 07/12/2019   Procedure: RIGHT/LEFT HEART CATH AND CORONARY ANGIOGRAPHY;   Surgeon: Troy Sine, MD;  Location: [place] CV LAB;  Service: Cardiovascular;  Laterality: N/A;  . SHOULDER SURGERY Bilateral   . TOTAL KNEE ARTHROPLASTY Right 03/16/2020   Procedure: TOTAL KNEE ARTHROPLASTY;  Surgeon: Susa Day, MD;  [facility];  Service: Orthopedics;  Laterality: Right;  2.5 hrs    There were no vitals filed for this visit.   Subjective Assessment - 04/26/20 0819    Subjective COVID-19 screen performed prior to patient entering clinic.  Patient arrived with some increased soreness today.    Pertinent History HTN, CHF, Chronic kidney disease, sulfa drug allergy, IVC filter, history of shoulder surgery, L foot drop after lumbar surgery    Limitations Standing;Sitting;Walking;House hold activities    How long can you stand comfortably? short periods    How long can you walk comfortably? between rooms    Diagnostic tests x-ray    Patient Stated Goals walk with rollator    Currently in Pain? Yes    Pain Score 3     Pain Location Knee    Pain Orientation Left    Pain Descriptors / Indicators Discomfort    Pain Type Surgical pain    Pain Onset More than a month ago    Pain Frequency Intermittent    Aggravating Factors  prolong activity  Pain Relieving Factors rest              [facility] PT Assessment - 04/26/20 0001      AROM   AROM Assessment Site Knee    Right/Left Knee Right    Right Knee Extension -4    Right Knee Flexion 110      PROM   Right Knee Extension -1    Right Knee Flexion 116                         [facility] Adult PT Treatment/Exercise - 04/26/20 0001      Knee/Hip Exercises: Aerobic   Recumbent Bike x12 min (seat 7-4)      Knee/Hip Exercises: Standing   Forward Step Up Right;2 sets;10 reps;Step Height: 6";Hand Hold: 1    Rocker Board 2 minutes      Knee/Hip Exercises: Seated   Long Arc Quad Strengthening;Right;3 sets;10 reps    Long Arc Quad Weight 4 lbs.      Civil engineer, contracting Location Right knee.    Electrical Stimulation Action IFC 80-'150hz'  x30mn    Electrical Stimulation Goals Pain      Vasopneumatic   Number Minutes Vasopneumatic  10 minutes    Vasopnuematic Location  Knee    Vasopneumatic Pressure Low    Vasopneumatic Temperature  --   no pressure     Manual Therapy   Manual Therapy Passive ROM    Passive ROM PROM for right kne flexion and ext with low load holds to improve mobility                       PT Long Term Goals - 04/26/20 00174     PT LONG TERM GOAL #1   Title Patient will be independent with HEP and its progression.    Time 4    Period Weeks    Status On-going      PT LONG TERM GOAL #2   Title Patient will demonstrate 5 degrees or less of R knee extension AROM to improve gait mechanics.    Baseline AROM -5 degrees 04/23/20    Time 4    Period Weeks    Status Achieved      PT LONG TERM GOAL #3   Title Patient will demonstrate 115+ degrees of right knee flexion AROM to improve ability to perform home activities.    Baseline AROM 110 degrees 04/26/20    Time 4    Period Weeks    Status On-going      PT LONG TERM GOAL #4   Title Patient will report ability to perform ADLs independently with R knee pain less than or equal to 3/10.    Time 4    Period Weeks    Status On-going      PT LONG TERM GOAL #5   Title Patient will ambulate with rollator with step through gait pattern to safely walk in home and around community.    Time 4    Period Weeks    Status On-going                 Plan - 04/26/20 0855    Clinical Impression Statement Patient tolerated treatment well today. Patient able to progress forward on bike today for ROM with no pain. Patient is progressing with right knee ROM and strengthening. Patient continues to have discomfort and weakness with prolong activity or  walking. Goals progressing this week.    Personal Factors and Comorbidities Comorbidity 3+    Comorbidities R TKA  03/16/2020,  HTN, CHF, Chronic kidney disease, sulfa drug allergy, IVC filter, history of shoulder surgery, L foot drop after lumbar surgery    Examination-Activity Limitations Bed Mobility;Locomotion Level;Transfers;Sleep;Stairs;Stand;Dressing    Stability/Clinical Decision Making Stable/Uncomplicated    Rehab Potential Good    PT Frequency 3x / week    PT Duration 4 weeks    PT Treatment/Interventions ADLs/Self Care Home Management;Cryotherapy;Electrical Stimulation;Gait training;Stair training;Functional mobility training;Therapeutic activities;Therapeutic exercise;Balance training;Neuromuscular re-education;Manual techniques;Passive range of motion;Patient/family education;Vasopneumatic Device    PT Next Visit Plan cont with right knee AROM and PROM, gait training to return to ambulation with rollator, VASO WITH NO COMPRESSION.    Consulted and Agree with Plan of Care Patient           Patient will benefit from skilled therapeutic intervention in order to improve the following deficits and impairments:  Abnormal gait,Decreased balance,Decreased strength,Increased edema,Pain,Decreased range of motion,Difficulty walking  Visit Diagnosis: Acute pain of right knee  Stiffness of right knee, not elsewhere classified  Localized edema  Difficulty in walking, not elsewhere classified     Problem List Patient Active Problem List   Diagnosis Date Noted  . S/P TKR (total knee replacement) using cement 03/16/2020  . Hyperlipidemia 01/31/2020  . AKI (acute kidney injury) ([place]) 09/15/2019  . Right flank pain 09/15/2019  . Anemia 09/15/2019  . Abnormal nuclear cardiac imaging test   . CHF (congestive heart failure) ([place]) 07/09/2019  . Dyspnea 07/09/2019  . Respiratory failure with hypoxia ([place]) 07/09/2019  . Shoulder pain 07/09/2019  . Dyspnea on exertion 06/03/2019  . Bilateral lower extremity edema 06/03/2019  . Left bundle branch block 06/03/2019  . Iron deficiency anemia due to  chronic blood loss 03/10/2017  . Postoperative wound dehiscence 12/02/2016  . E. coli UTI   . Abnormal urinalysis   . Medication side effect   . Neuropathic pain   . History of DVT (deep vein thrombosis)   . HTN (hypertension)   . RLS (restless legs syndrome)   . Urinary retention   . Hypokalemia   . Hypoalbuminemia due to protein-calorie malnutrition ([place])   . Acute blood loss anemia   . Lumbar stenosis with neurogenic claudication 11/18/2016  . Spondylolisthesis of lumbar region 11/12/2016  . Hereditary hemochromatosis ([place]) 07/28/2016  . Prothrombin gene mutation ([place]) 05/30/2013  . Right leg DVT ([place]) 05/30/2013  . Hemochromatosis 07/29/2012    Ladean Raya, PTA 04/26/20 9:05 AM  [facility] [place], [place], [address] Phone: [phone]   Fax:  [phone]  Name: [name] MRN: [id number] Date of Birth: 01-25-1941   Progress Note Reporting Period 02/23/20 to 04/26/20  See note below for Objective Data and Assessment of Progress/Goals. LTG #2 met.  Patient able to perform stationary bike making forward revolutions now.    Mali Applegate MPT

## 2020-04-30 ENCOUNTER — Ambulatory Visit: Payer: PPO | Admitting: Physical Therapy

## 2020-05-02 ENCOUNTER — Other Ambulatory Visit: Payer: Self-pay

## 2020-05-02 ENCOUNTER — Ambulatory Visit: Payer: PPO | Attending: Specialist | Admitting: Physical Therapy

## 2020-05-02 DIAGNOSIS — M25661 Stiffness of right knee, not elsewhere classified: Secondary | ICD-10-CM | POA: Insufficient documentation

## 2020-05-02 DIAGNOSIS — R600 Localized edema: Secondary | ICD-10-CM | POA: Insufficient documentation

## 2020-05-02 DIAGNOSIS — M25561 Pain in right knee: Secondary | ICD-10-CM | POA: Insufficient documentation

## 2020-05-02 DIAGNOSIS — R262 Difficulty in walking, not elsewhere classified: Secondary | ICD-10-CM | POA: Diagnosis not present

## 2020-05-02 DIAGNOSIS — Z4789 Encounter for other orthopedic aftercare: Secondary | ICD-10-CM | POA: Diagnosis not present

## 2020-05-02 NOTE — Patient Instructions (Addendum)
Knee Extension (Sitting)   Place __0-3__ pound weight on left ankle and straighten knee fully, lower slowly. Repeat _10___ times per set. Do __2-3__ sets per session. Do __2-3__ sessions per day.   Bridging   Slowly raise buttocks from floor, keeping stomach tight. Repeat _10___ times per set. Do __2__ sets per session. Do __2__ sessions per day.   Straight Leg Raise   Tighten stomach and slowly raise locked right leg __4__ inches from floor. Repeat __10-30__ times per set. Do __2__ sets per session. Do __2__ sessions per day.    Lower Body: Toe Rise   Standing, place feet apart. Hold arms out for balance or use support. Rise up on toes. Hold _3___ seconds, then lower. Repeat immediately. Repeat __10__ times. Do __2__ sessions per day.   Half Squat to Chair   Stand with feet shoulder width apart. Push buttocks backward and lower slowly, sitting in chair lightly and returning to standing position. Complete _2_ sets of 10_ repetitions. Perform __2-3_ sessions per day.     Hip abduction   While sitting with good posture, tie theraband around knees and pull apart. Slowly resume starting position. x30 1-2 x day    Knee Flexion: Resisted (Sitting)      SEATED MARCHING - ELASTIC BAND  Start by sitting in a chair with an elastic band wrapped around your lower thighs.  Next, move a knee upward, set it back down and then alternate to the other side. Perform 10-20 1-2x daily    Heel Slide   Bend left knee and pull heel toward buttocks. Use strap around foot and pull strap with arms to assist knee to bend further. Hold 10 secs.  Repeat __10-20__ times. Do __2-4__ sessions per day.

## 2020-05-02 NOTE — Therapy (Signed)
[facility] [place], [place], [address] Phone: [phone]   Fax:  [phone]  Physical Therapy Treatment  Patient Details  Name: [name] MRN: [id number] Date of Birth: 1940/06/17 Referring Provider (PT): Susa Day, MD   Encounter Date: 05/02/2020   PT End of Session - 05/02/20 4401    Visit Number 11    Number of Visits 12    Date for PT Re-Evaluation 05/09/20    Authorization Type [employer] (CQ modifier) FOTO 10th visit 35%; progress note every 10th visit    PT Start Time 0815    PT Stop Time 0911    PT Time Calculation (min) 56 min    Activity Tolerance Patient tolerated treatment well    Behavior During Therapy [facility] for tasks assessed/performed           Past Medical History:  Diagnosis Date  . Anemia   . Arthritis    osteoarthritis Shoulder,neck  . Bell's palsy 1980s  . CHF (congestive heart failure) ([place]) 06/2019  . Chronic kidney disease    Stage III  . Dyspnea 06/2019   On exertion  . GERD (gastroesophageal reflux disease)   . Hemochromatosis   . History of hiatal hernia   . Hypertension   . Iron deficiency anemia due to chronic blood loss 03/10/2017  . PE (pulmonary embolism) 2007   x2  . Peripheral vascular disease (HCC)    DVT Rt leg  . Pneumonia 1990s   "walking" pneumonia  . Prothrombin gene mutation ([place]) 05/30/2013  . Restless legs   . Right leg DVT ([place]) 05/30/2013  . Stroke [facility])    found on a MRI, she's not aware otherwise    Past Surgical History:  Procedure Laterality Date  . BACK SURGERY    . CARDIAC CATHETERIZATION Bilateral 2005  . COLONOSCOPY    . IVC FILTER INSERTION N/A 03/12/2020   Procedure: IVC FILTER INSERTION;  Surgeon: Waynetta Sandy, MD;  Location: [place] CV LAB;  Service: Cardiovascular;  Laterality: N/A;  . RIGHT/LEFT HEART CATH AND CORONARY ANGIOGRAPHY N/A 07/12/2019   Procedure: RIGHT/LEFT HEART CATH AND CORONARY ANGIOGRAPHY;   Surgeon: Troy Sine, MD;  Location: [place] CV LAB;  Service: Cardiovascular;  Laterality: N/A;  . SHOULDER SURGERY Bilateral   . TOTAL KNEE ARTHROPLASTY Right 03/16/2020   Procedure: TOTAL KNEE ARTHROPLASTY;  Surgeon: Susa Day, MD;  Location: [facility];  Service: Orthopedics;  Laterality: Right;  2.5 hrs    There were no vitals filed for this visit.   Subjective Assessment - 05/02/20 0820    Subjective COVID-19 screen performed prior to patient entering clinic.  Patient reported ongoing discomfort when walking.    Pertinent History HTN, CHF, Chronic kidney disease, sulfa drug allergy, IVC filter, history of shoulder surgery, L foot drop after lumbar surgery    Limitations Standing;Sitting;Walking;House hold activities    How long can you stand comfortably? short periods    How long can you walk comfortably? between rooms    Diagnostic tests x-ray    Patient Stated Goals walk with rollator    Currently in Pain? Yes    Pain Score 4     Pain Location Knee    Pain Orientation Left    Pain Descriptors / Indicators Discomfort    Pain Type Surgical pain    Pain Onset More than a month ago    Pain Frequency Intermittent    Aggravating Factors  prolong activity/walking  Pain Relieving Factors at rest              [facility] PT Assessment - 05/02/20 0001      AROM   AROM Assessment Site Knee    Right/Left Knee Right    Right Knee Extension -4    Right Knee Flexion 115      PROM   PROM Assessment Site Knee    Right/Left Knee Right    Right Knee Extension -1    Right Knee Flexion 119                         [facility] Adult PT Treatment/Exercise - 05/02/20 0001      Knee/Hip Exercises: Aerobic   Recumbent Bike x12mn (seat 5-4)      Knee/Hip Exercises: Standing   Forward Step Up Right;2 sets;10 reps;Step Height: 6";Hand Hold: 1    Step Down Right;1 set;10 reps;Hand Hold: 2;Step Height: 4"      Knee/Hip Exercises: Seated   Long Arc Quad  Strengthening;Right;3 sets;10 reps    Long Arc Quad Weight 4 lbs.    Long ACSX CorporationLimitations with ball squeeze for VMO activation      Knee/Hip Exercises: Supine   Short Arc QTarget CorporationStrengthening;Right;3 sets;10 reps    Short Arc QTarget CorporationLimitations 4# w ball squueze for VUnited States Steel Corporationactivation    BDarden RestaurantsStrengthening;Both;20 reps    Straight Leg Raises Strengthening;Right;1 set;20 reps;5 reps    Other Supine Knee/Hip Exercises clamshell with red t-band x30      Electrical Stimulation   Electrical Stimulation Location Right knee.    EChartered certified accountantIFC    Electrical Stimulation Parameters 80-'150hz'  x169m    Electrical Stimulation Goals Pain      Vasopneumatic   Number Minutes Vasopneumatic  10 minutes    Vasopnuematic Location  Knee    Vasopneumatic Pressure --   no compression   Vasopneumatic Temperature  34 for pain and edema      Manual Therapy   Manual Therapy Passive ROM    Passive ROM PROM for right kne flexion and ext with low load holds to improve mobility                  PT Education - 05/02/20 0854    Education Details HEP progression    Person(s) Educated Patient    Methods Explanation;Demonstration;Handout    Comprehension Verbalized understanding;Returned demonstration               PT Long Term Goals - 05/02/20 [id number]    PT LONG TERM GOAL #1   Title Patient will be independent with HEP and its progression.    Baseline Met 05/02/20    Time 4    Period Weeks    Status Achieved      PT LONG TERM GOAL #2   Title Patient will demonstrate 5 degrees or less of R knee extension AROM to improve gait mechanics.    Baseline AROM -5 degrees 04/23/20    Time 4    Period Weeks    Status Achieved      PT LONG TERM GOAL #3   Title Patient will demonstrate 115+ degrees of right knee flexion AROM to improve ability to perform home activities.    Baseline AROM 115 degrees 05/02/20    Time 4    Period Weeks    Status Achieved      PT LONG  TERM GOAL #  4   Title Patient will report ability to perform ADLs independently with R knee pain less than or equal to 3/10.    Baseline 4/10 reported today 05/02/20    Time 4    Period Weeks    Status On-going      PT LONG TERM GOAL #5   Title Patient will ambulate with rollator with step through gait pattern to safely walk in home and around community.    Time 4    Period Weeks    Status On-going                 Plan - 05/02/20 0906    Clinical Impression Statement Patient tolerated treatment very well today and continues to progress with improved ROM and strength progression. Patient issued HEP for home progression for right LE strengthening and educated with maintaining ROM for mobility. Patient met LTG #1 for HEP and #3 for Flexion with remaing goals progressing due to strength and pain limitations. Patient going to MD for F/U today and will progress per recommendation.    Personal Factors and Comorbidities Comorbidity 3+    Comorbidities R TKA 03/16/2020,  HTN, CHF, Chronic kidney disease, sulfa drug allergy, IVC filter, history of shoulder surgery, L foot drop after lumbar surgery    Examination-Activity Limitations Bed Mobility;Locomotion Level;Transfers;Sleep;Stairs;Stand;Dressing    Stability/Clinical Decision Making Stable/Uncomplicated    Rehab Potential Good    PT Frequency 3x / week    PT Duration 4 weeks    PT Treatment/Interventions ADLs/Self Care Home Management;Cryotherapy;Electrical Stimulation;Gait training;Stair training;Functional mobility training;Therapeutic activities;Therapeutic exercise;Balance training;Neuromuscular re-education;Manual techniques;Passive range of motion;Patient/family education;Vasopneumatic Device    PT Next Visit Plan cont with right knee ROM and strength progression/ gait training to return to ambulation with rollator, VASO WITH NO COMPRESSION.    Consulted and Agree with Plan of Care Patient           Patient will benefit from  skilled therapeutic intervention in order to improve the following deficits and impairments:  Abnormal gait,Decreased balance,Decreased strength,Increased edema,Pain,Decreased range of motion,Difficulty walking  Visit Diagnosis: Acute pain of right knee  Stiffness of right knee, not elsewhere classified  Localized edema  Difficulty in walking, not elsewhere classified     Problem List Patient Active Problem List   Diagnosis Date Noted  . S/P TKR (total knee replacement) using cement 03/16/2020  . Hyperlipidemia 01/31/2020  . AKI (acute kidney injury) ([place]) 09/15/2019  . Right flank pain 09/15/2019  . Anemia 09/15/2019  . Abnormal nuclear cardiac imaging test   . CHF (congestive heart failure) ([place]) 07/09/2019  . Dyspnea 07/09/2019  . Respiratory failure with hypoxia ([place]) 07/09/2019  . Shoulder pain 07/09/2019  . Dyspnea on exertion 06/03/2019  . Bilateral lower extremity edema 06/03/2019  . Left bundle branch block 06/03/2019  . Iron deficiency anemia due to chronic blood loss 03/10/2017  . Postoperative wound dehiscence 12/02/2016  . E. coli UTI   . Abnormal urinalysis   . Medication side effect   . Neuropathic pain   . History of DVT (deep vein thrombosis)   . HTN (hypertension)   . RLS (restless legs syndrome)   . Urinary retention   . Hypokalemia   . Hypoalbuminemia due to protein-calorie malnutrition ([place])   . Acute blood loss anemia   . Lumbar stenosis with neurogenic claudication 11/18/2016  . Spondylolisthesis of lumbar region 11/12/2016  . Hereditary hemochromatosis ([place]) 07/28/2016  . Prothrombin gene mutation ([place]) 05/30/2013  . Right leg DVT ([place]) 05/30/2013  .  Hemochromatosis 07/29/2012    Ladean Raya, PTA 05/02/20 9:16 AM   [facility] [place], [place], [address] Phone: [phone]   Fax:  [phone]  Name: [name] MRN: [id number] Date of Birth: 1940/08/28

## 2020-05-04 ENCOUNTER — Ambulatory Visit: Payer: PPO | Admitting: Physical Therapy

## 2020-05-18 ENCOUNTER — Inpatient Hospital Stay (HOSPITAL_BASED_OUTPATIENT_CLINIC_OR_DEPARTMENT_OTHER): Payer: PPO | Admitting: Family

## 2020-05-18 ENCOUNTER — Inpatient Hospital Stay: Payer: PPO | Attending: Hematology & Oncology

## 2020-05-18 ENCOUNTER — Encounter: Payer: Self-pay | Admitting: Family

## 2020-05-18 ENCOUNTER — Other Ambulatory Visit: Payer: Self-pay

## 2020-05-18 VITALS — BP 123/52 | HR 62 | Temp 97.4°F | Resp 17 | Ht 63.0 in | Wt 171.2 lb

## 2020-05-18 DIAGNOSIS — Z86718 Personal history of other venous thrombosis and embolism: Secondary | ICD-10-CM | POA: Diagnosis not present

## 2020-05-18 DIAGNOSIS — D500 Iron deficiency anemia secondary to blood loss (chronic): Secondary | ICD-10-CM | POA: Diagnosis not present

## 2020-05-18 DIAGNOSIS — Z96651 Presence of right artificial knee joint: Secondary | ICD-10-CM | POA: Diagnosis not present

## 2020-05-18 DIAGNOSIS — Z7901 Long term (current) use of anticoagulants: Secondary | ICD-10-CM | POA: Insufficient documentation

## 2020-05-18 DIAGNOSIS — I82401 Acute embolism and thrombosis of unspecified deep veins of right lower extremity: Secondary | ICD-10-CM

## 2020-05-18 DIAGNOSIS — Z7982 Long term (current) use of aspirin: Secondary | ICD-10-CM | POA: Diagnosis not present

## 2020-05-18 DIAGNOSIS — D6852 Prothrombin gene mutation: Secondary | ICD-10-CM | POA: Diagnosis not present

## 2020-05-18 LAB — CBC WITH DIFFERENTIAL (CANCER CENTER ONLY)
Abs Immature Granulocytes: 0.01 10*3/uL (ref 0.00–0.07)
Basophils Absolute: 0.1 10*3/uL (ref 0.0–0.1)
Basophils Relative: 1 %
Eosinophils Absolute: 0.2 10*3/uL (ref 0.0–0.5)
Eosinophils Relative: 3 %
HCT: 36.5 % (ref 36.0–46.0)
Hemoglobin: 11.6 g/dL — ABNORMAL LOW (ref 12.0–15.0)
Immature Granulocytes: 0 %
Lymphocytes Relative: 24 %
Lymphs Abs: 1.3 10*3/uL (ref 0.7–4.0)
MCH: 28.8 pg (ref 26.0–34.0)
MCHC: 31.8 g/dL (ref 30.0–36.0)
MCV: 90.6 fL (ref 80.0–100.0)
Monocytes Absolute: 0.5 10*3/uL (ref 0.1–1.0)
Monocytes Relative: 10 %
Neutro Abs: 3.4 10*3/uL (ref 1.7–7.7)
Neutrophils Relative %: 62 %
Platelet Count: 210 10*3/uL (ref 150–400)
RBC: 4.03 MIL/uL (ref 3.87–5.11)
RDW: 15.2 % (ref 11.5–15.5)
WBC Count: 5.5 10*3/uL (ref 4.0–10.5)
nRBC: 0 % (ref 0.0–0.2)

## 2020-05-18 LAB — CMP (CANCER CENTER ONLY)
ALT: 12 U/L (ref 0–44)
AST: 20 U/L (ref 15–41)
Albumin: 4.2 g/dL (ref 3.5–5.0)
Alkaline Phosphatase: 89 U/L (ref 38–126)
Anion gap: 8 (ref 5–15)
BUN: 34 mg/dL — ABNORMAL HIGH (ref 8–23)
CO2: 34 mmol/L — ABNORMAL HIGH (ref 22–32)
Calcium: 10.9 mg/dL — ABNORMAL HIGH (ref 8.9–10.3)
Chloride: 98 mmol/L (ref 98–111)
Creatinine: 1.3 mg/dL — ABNORMAL HIGH (ref 0.44–1.00)
GFR, Estimated: 42 mL/min — ABNORMAL LOW (ref >60)
Glucose, Bld: 96 mg/dL (ref 70–99)
Potassium: 4.2 mmol/L (ref 3.5–5.1)
Sodium: 140 mmol/L (ref 135–145)
Total Bilirubin: 0.5 mg/dL (ref 0.3–1.2)
Total Protein: 7.5 g/dL (ref 6.5–8.1)

## 2020-05-18 NOTE — Progress Notes (Signed)
Hematology and Oncology Follow Up Visit  [name] [id number] 1940-10-05 80 y.o. 05/18/2020   Principle Diagnosis:  DVT of the right leg Prothrombin II gene mutation Hemachromatosis (homozygous for C282Y Mutation) Past history of right lower extremity DVT/PE Iron deficiency anemia secondary to blood loss  Current Therapy: Aspirin 162 mg PO daily IV iron as indicated    Interim History:  [name] is here today for follow-up. She had her right knee replacement surgery in November and states that everything went well. She just recently finished PT.  She notes fatigue at times.  She had a temporary IVC filter placed prior to surgery and will be having this removed on 06/11/2020. She was on Eliquis but stopped due to nose bleeds and is back on the 2 baby aspirin daily and tolerating well.  She states that she has had some right hip and lower back pain with sciatica. He follow-up with surgery is in 2 weeks and plans to discuss with them.  No swelling noted in extremities.  No falls or syncope to report. She ambulates with a Rolator for added support.  She states that her appetite has been good and she is staying well hydrated. Her weight is stable at 171 lbs.  No fever, chills, n/v, cough, rash, SOB, chest pain, palpitations, abdominal pain or changes in bowel or bladder habits.  She has occasional episodes of dizziness if she stands too quickly.   ECOG Performance Status: 1 - Symptomatic but completely ambulatory  Medications:  Allergies as of 05/18/2020      Reactions   Morphine And Related Other (See Comments)   LARGER DOSES OF MORPHINE CAUSES BODY TWITCHING   Penicillins Anaphylaxis   Childhood reaction Has patient had a PCN reaction causing immediate rash, facial/tongue/throat swelling, SOB or lightheadedness with hypotension: Yes Has patient had a PCN reaction causing severe rash involving mucus membranes or skin necrosis: Unknown Has patient had a PCN reaction  that required hospitalization: No Has patient had a PCN reaction occurring within the last 10 years: no (5 or 80 yrs old) If all of the above answers are "NO", then may proceed with Cephalosporin use.   Sulfa Antibiotics Other (See Comments)   Unknown childhood reaction      Medication List       Accurate as of May 18, 2020 12:12 PM. If you have any questions, ask your nurse or doctor.        allopurinol 100 MG tablet Commonly known as: ZYLOPRIM Take 100 mg by mouth daily.   apixaban 2.5 MG Tabs tablet Commonly known as: Eliquis Take 1 tablet (2.5 mg total) by mouth 2 (two) times daily.   ARTIFICIAL TEARS OP Place 1 drop into both eyes 3 (three) times daily as needed (dry eyes).   atorvastatin 40 MG tablet Commonly known as: LIPITOR Take 1 tablet (40 mg total) by mouth daily at 6 PM.   Biotin 5000 MCG Tabs Take 5,000 mcg by mouth daily.   CALCIUM 600+D3 PO Take 1 tablet by mouth in the morning and at bedtime.   citalopram 20 MG tablet Commonly known as: CELEXA Take 20 mg by mouth at bedtime.   colchicine 0.6 MG tablet Take 0.6 mg by mouth every other day.   cycloSPORINE 0.05 % ophthalmic emulsion Commonly known as: RESTASIS Place 1 drop into both eyes 2 (two) times daily.   docusate sodium 100 MG capsule Commonly known as: Colace Take 1 capsule (100 mg total) by mouth 2 (two) times  daily.   ferrous sulfate 325 (65 FE) MG EC tablet Take 325 mg by mouth daily.   FiberCon 625 MG tablet Generic drug: polycarbophil Take 625 mg by mouth in the morning and at bedtime.   Fish Oil 1200 MG Caps Take 1,200 mg by mouth daily.   FISH OIL PO Take 2,000 mg by mouth daily.   furosemide 80 MG tablet Commonly known as: Lasix Take 1 tablet (80 mg total) by mouth daily.   losartan 50 MG tablet Commonly known as: COZAAR Take 1 tablet (50 mg total) by mouth daily.   metoprolol succinate 25 MG 24 hr tablet Commonly known as: TOPROL-XL Take 1 tablet (25 mg total)  by mouth daily.   multivitamin with minerals Tabs tablet Take 1 tablet by mouth daily. Centrum Silver (NO IRON)   omeprazole 20 MG capsule Commonly known as: PRILOSEC Take 20 mg by mouth daily before breakfast.   oxyCODONE-acetaminophen 5-325 MG tablet Commonly known as: Percocet Take 1-2 tablets by mouth every 4 (four) hours as needed for severe pain.   polyethylene glycol 17 g packet Commonly known as: MIRALAX / GLYCOLAX Take 17 g by mouth daily.   pramipexole 0.5 MG tablet Commonly known as: MIRAPEX Take 0.5 mg by mouth daily in the afternoon.   predniSONE 10 MG tablet Commonly known as: DELTASONE Take by mouth.   temazepam 15 MG capsule Commonly known as: RESTORIL Take 1 capsule (15 mg total) by mouth at bedtime.   Turmeric Curcumin 500 MG Caps Take 500 mg by mouth at bedtime.   Vitamin D3 50 MCG (2000 UT) Tabs Take 2,000 Units by mouth in the morning and at bedtime.   vitamin E 180 MG (400 UNITS) capsule Take 400 Units by mouth at bedtime.       Allergies:  Allergies  Allergen Reactions   Morphine And Related Other (See Comments)    LARGER DOSES OF MORPHINE CAUSES BODY TWITCHING   Penicillins Anaphylaxis    Childhood reaction Has patient had a PCN reaction causing immediate rash, facial/tongue/throat swelling, SOB or lightheadedness with hypotension: Yes Has patient had a PCN reaction causing severe rash involving mucus membranes or skin necrosis: Unknown Has patient had a PCN reaction that required hospitalization: No Has patient had a PCN reaction occurring within the last 10 years: no (5 or 80 yrs old) If all of the above answers are "NO", then may proceed with Cephalosporin use.    Sulfa Antibiotics Other (See Comments)    Unknown childhood reaction    Past Medical History, Surgical history, Social history, and Family History were reviewed and updated.  Review of Systems: All other 10 point review of systems is negative.   Physical Exam:   vitals were not taken for this visit.   Wt Readings from Last 3 Encounters:  03/16/20 169 lb 15.6 oz (77.1 kg)  03/12/20 170 lb (77.1 kg)  03/09/20 172 lb (78 kg)    Ocular: Sclerae unicteric, pupils equal, round and reactive to light Ear-nose-throat: Oropharynx clear, dentition fair Lymphatic: No cervical or supraclavicular adenopathy Lungs no rales or rhonchi, good excursion bilaterally Heart regular rate and rhythm, no murmur appreciated Abd soft, nontender, positive bowel sounds MSK no focal spinal tenderness, no joint edema Neuro: non-focal, well-oriented, appropriate affect Breasts: Deferred   Lab Results  Component Value Date   WBC 5.5 05/18/2020   HGB 11.6 (L) 05/18/2020   HCT 36.5 05/18/2020   MCV 90.6 05/18/2020   PLT 210 05/18/2020   Lab Results  Component Value Date   FERRITIN 253 11/17/2019   IRON 24 (L) 11/17/2019   TIBC 214 (L) 11/17/2019   UIBC 190 11/17/2019   IRONPCTSAT 11 (L) 11/17/2019   Lab Results  Component Value Date   RETICCTPCT 0.9 09/16/2019   RBC 4.03 05/18/2020   RETICCTABS 39.7 05/11/2014   No results found for: KPAFRELGTCHN, LAMBDASER, KAPLAMBRATIO No results found for: Kandis Cocking, IGMSERUM No results found for: Kathrynn Ducking, MSPIKE, SPEI   Chemistry      Component Value Date/Time   NA 134 (L) 03/17/2020 0354   NA 140 11/15/2019 1023   NA 144 04/13/2017 1134   NA 142 07/28/2016 1144   K 4.8 03/17/2020 0354   K 3.3 04/13/2017 1134   K 3.6 07/28/2016 1144   CL 99 03/17/2020 0354   CL 97 (L) 04/13/2017 1134   CO2 28 03/17/2020 0354   CO2 32 04/13/2017 1134   CO2 33 (H) 07/28/2016 1144   BUN 31 (H) 03/17/2020 0354   BUN 31 (H) 11/15/2019 1023   BUN 18 04/13/2017 1134   BUN 22.6 07/28/2016 1144   CREATININE 1.13 (H) 03/17/2020 0354   CREATININE 1.04 (H) 05/09/2019 1456   CREATININE 1.0 04/13/2017 1134   CREATININE 1.0 07/28/2016 1144      Component Value Date/Time   CALCIUM  8.5 (L) 03/17/2020 0354   CALCIUM 10.1 04/13/2017 1134   CALCIUM 9.5 07/28/2016 1144   ALKPHOS 64 11/17/2019 1048   ALKPHOS 103 (H) 04/13/2017 1134   ALKPHOS 84 07/28/2016 1144   AST 28 11/17/2019 1048   AST 23 05/09/2019 1456   AST 24 07/28/2016 1144   ALT 20 11/17/2019 1048   ALT 17 05/09/2019 1456   ALT 27 04/13/2017 1134   ALT 17 07/28/2016 1144   BILITOT 0.5 11/17/2019 1048   BILITOT 0.4 05/09/2019 1456   BILITOT 0.41 07/28/2016 1144       Impression and Plan: [name] is a very pleasant 80 yo caucasian female with a chronic DVT in the right lower extremity.  She is doing well on 2 baby aspirin an so far there has been no recurrence.  She also has history of hemochromatosis as well as iron deficiency anemia. Her iron studies are pending.  Follow-up planned for in 6 months.  She was encouraged to contact our office with any questions or concerns.   Laverna Peace, NP 1/21/202212:12 PM

## 2020-05-21 LAB — IRON AND TIBC
Iron: 147 ug/dL — ABNORMAL HIGH (ref 41–142)
Saturation Ratios: 59 % — ABNORMAL HIGH (ref 21–57)
TIBC: 250 ug/dL (ref 236–444)
UIBC: 103 ug/dL — ABNORMAL LOW (ref 120–384)

## 2020-05-21 LAB — FERRITIN: Ferritin: 158 ng/mL (ref 11–307)

## 2020-05-23 DIAGNOSIS — G250 Essential tremor: Secondary | ICD-10-CM | POA: Diagnosis not present

## 2020-05-23 DIAGNOSIS — M8580 Other specified disorders of bone density and structure, unspecified site: Secondary | ICD-10-CM | POA: Diagnosis not present

## 2020-05-23 DIAGNOSIS — N159 Renal tubulo-interstitial disease, unspecified: Secondary | ICD-10-CM | POA: Diagnosis not present

## 2020-05-23 DIAGNOSIS — N183 Chronic kidney disease, stage 3 unspecified: Secondary | ICD-10-CM | POA: Diagnosis not present

## 2020-05-23 DIAGNOSIS — G259 Extrapyramidal and movement disorder, unspecified: Secondary | ICD-10-CM | POA: Diagnosis not present

## 2020-05-23 DIAGNOSIS — I10 Essential (primary) hypertension: Secondary | ICD-10-CM | POA: Diagnosis not present

## 2020-05-23 DIAGNOSIS — K219 Gastro-esophageal reflux disease without esophagitis: Secondary | ICD-10-CM | POA: Diagnosis not present

## 2020-05-23 DIAGNOSIS — M10041 Idiopathic gout, right hand: Secondary | ICD-10-CM | POA: Diagnosis not present

## 2020-05-23 DIAGNOSIS — M19041 Primary osteoarthritis, right hand: Secondary | ICD-10-CM | POA: Diagnosis not present

## 2020-05-23 DIAGNOSIS — D508 Other iron deficiency anemias: Secondary | ICD-10-CM | POA: Diagnosis not present

## 2020-05-23 DIAGNOSIS — F419 Anxiety disorder, unspecified: Secondary | ICD-10-CM | POA: Diagnosis not present

## 2020-05-23 DIAGNOSIS — F329 Major depressive disorder, single episode, unspecified: Secondary | ICD-10-CM | POA: Diagnosis not present

## 2020-05-23 DIAGNOSIS — M1990 Unspecified osteoarthritis, unspecified site: Secondary | ICD-10-CM | POA: Diagnosis not present

## 2020-05-23 DIAGNOSIS — G4700 Insomnia, unspecified: Secondary | ICD-10-CM | POA: Diagnosis not present

## 2020-05-23 DIAGNOSIS — E7800 Pure hypercholesterolemia, unspecified: Secondary | ICD-10-CM | POA: Diagnosis not present

## 2020-05-28 DIAGNOSIS — M1009 Idiopathic gout, multiple sites: Secondary | ICD-10-CM | POA: Diagnosis not present

## 2020-05-28 DIAGNOSIS — M150 Primary generalized (osteo)arthritis: Secondary | ICD-10-CM | POA: Diagnosis not present

## 2020-05-28 DIAGNOSIS — E669 Obesity, unspecified: Secondary | ICD-10-CM | POA: Diagnosis not present

## 2020-05-28 DIAGNOSIS — M2550 Pain in unspecified joint: Secondary | ICD-10-CM | POA: Diagnosis not present

## 2020-05-28 DIAGNOSIS — M1120 Other chondrocalcinosis, unspecified site: Secondary | ICD-10-CM | POA: Diagnosis not present

## 2020-05-28 DIAGNOSIS — Z6830 Body mass index (BMI) 30.0-30.9, adult: Secondary | ICD-10-CM | POA: Diagnosis not present

## 2020-06-06 DIAGNOSIS — G4700 Insomnia, unspecified: Secondary | ICD-10-CM | POA: Diagnosis not present

## 2020-06-06 DIAGNOSIS — K219 Gastro-esophageal reflux disease without esophagitis: Secondary | ICD-10-CM | POA: Diagnosis not present

## 2020-06-06 DIAGNOSIS — D508 Other iron deficiency anemias: Secondary | ICD-10-CM | POA: Diagnosis not present

## 2020-06-06 DIAGNOSIS — I10 Essential (primary) hypertension: Secondary | ICD-10-CM | POA: Diagnosis not present

## 2020-06-06 DIAGNOSIS — M19041 Primary osteoarthritis, right hand: Secondary | ICD-10-CM | POA: Diagnosis not present

## 2020-06-06 DIAGNOSIS — N183 Chronic kidney disease, stage 3 unspecified: Secondary | ICD-10-CM | POA: Diagnosis not present

## 2020-06-06 DIAGNOSIS — N159 Renal tubulo-interstitial disease, unspecified: Secondary | ICD-10-CM | POA: Diagnosis not present

## 2020-06-06 DIAGNOSIS — F329 Major depressive disorder, single episode, unspecified: Secondary | ICD-10-CM | POA: Diagnosis not present

## 2020-06-06 DIAGNOSIS — M1990 Unspecified osteoarthritis, unspecified site: Secondary | ICD-10-CM | POA: Diagnosis not present

## 2020-06-06 DIAGNOSIS — E7800 Pure hypercholesterolemia, unspecified: Secondary | ICD-10-CM | POA: Diagnosis not present

## 2020-06-06 DIAGNOSIS — M8580 Other specified disorders of bone density and structure, unspecified site: Secondary | ICD-10-CM | POA: Diagnosis not present

## 2020-06-08 ENCOUNTER — Other Ambulatory Visit (HOSPITAL_COMMUNITY)
Admission: RE | Admit: 2020-06-08 | Discharge: 2020-06-08 | Disposition: A | Discharge status: Home / Self Care | Payer: PPO | Source: Ambulatory Visit | Attending: Vascular Surgery | Admitting: Vascular Surgery

## 2020-06-08 DIAGNOSIS — Z01812 Encounter for preprocedural laboratory examination: Secondary | ICD-10-CM | POA: Insufficient documentation

## 2020-06-08 DIAGNOSIS — Z20822 Contact with and (suspected) exposure to covid-19: Secondary | ICD-10-CM | POA: Insufficient documentation

## 2020-06-08 LAB — SARS CORONAVIRUS 2 (TAT 6-24 HRS): SARS Coronavirus 2: NEGATIVE

## 2020-06-11 ENCOUNTER — Encounter (HOSPITAL_COMMUNITY): Payer: Self-pay | Admitting: Vascular Surgery

## 2020-06-11 ENCOUNTER — Other Ambulatory Visit: Payer: Self-pay

## 2020-06-11 ENCOUNTER — Encounter (HOSPITAL_COMMUNITY)
Admission: RE | Disposition: A | Discharge status: Home / Self Care | Payer: Self-pay | Source: Home / Self Care | Attending: Vascular Surgery

## 2020-06-11 ENCOUNTER — Ambulatory Visit (HOSPITAL_COMMUNITY)
Admission: RE | Admit: 2020-06-11 | Discharge: 2020-06-11 | Disposition: A | Discharge status: Home / Self Care | Payer: PPO | Attending: Vascular Surgery | Admitting: Vascular Surgery

## 2020-06-11 DIAGNOSIS — Z86718 Personal history of other venous thrombosis and embolism: Secondary | ICD-10-CM | POA: Diagnosis not present

## 2020-06-11 DIAGNOSIS — Z452 Encounter for adjustment and management of vascular access device: Secondary | ICD-10-CM | POA: Insufficient documentation

## 2020-06-11 DIAGNOSIS — Z880 Allergy status to penicillin: Secondary | ICD-10-CM | POA: Insufficient documentation

## 2020-06-11 DIAGNOSIS — Z7982 Long term (current) use of aspirin: Secondary | ICD-10-CM | POA: Diagnosis not present

## 2020-06-11 DIAGNOSIS — Z882 Allergy status to sulfonamides status: Secondary | ICD-10-CM | POA: Diagnosis not present

## 2020-06-11 DIAGNOSIS — Z79899 Other long term (current) drug therapy: Secondary | ICD-10-CM | POA: Insufficient documentation

## 2020-06-11 DIAGNOSIS — Z885 Allergy status to narcotic agent status: Secondary | ICD-10-CM | POA: Insufficient documentation

## 2020-06-11 HISTORY — PX: IVC FILTER REMOVAL: CATH118246

## 2020-06-11 LAB — POCT I-STAT, CHEM 8
BUN: 52 mg/dL — ABNORMAL HIGH (ref 8–23)
Calcium, Ion: 1.28 mmol/L (ref 1.15–1.40)
Chloride: 101 mmol/L (ref 98–111)
Creatinine, Ser: 1.3 mg/dL — ABNORMAL HIGH (ref 0.44–1.00)
Glucose, Bld: 102 mg/dL — ABNORMAL HIGH (ref 70–99)
HCT: 36 % (ref 36.0–46.0)
Hemoglobin: 12.2 g/dL (ref 12.0–15.0)
Potassium: 4.2 mmol/L (ref 3.5–5.1)
Sodium: 141 mmol/L (ref 135–145)
TCO2: 30 mmol/L (ref 22–32)

## 2020-06-11 SURGERY — IVC FILTER REMOVAL
Anesthesia: LOCAL

## 2020-06-11 MED ORDER — IODIXANOL 320 MG/ML IV SOLN
INTRAVENOUS | Status: DC | PRN
  Administered 2020-06-11: 10 mL via INTRAVENOUS

## 2020-06-11 MED ORDER — FENTANYL CITRATE (PF) 100 MCG/2ML IJ SOLN
INTRAMUSCULAR | Status: DC | PRN
  Administered 2020-06-11: 25 ug via INTRAVENOUS
  Administered 2020-06-11: 50 ug via INTRAVENOUS

## 2020-06-11 MED ORDER — LIDOCAINE HCL (PF) 1 % IJ SOLN
INTRAMUSCULAR | Status: DC | PRN
  Administered 2020-06-11: 2 mL via INTRADERMAL

## 2020-06-11 MED ORDER — SODIUM CHLORIDE 0.9 % IV SOLN: INTRAVENOUS | Status: DC

## 2020-06-11 MED ORDER — MIDAZOLAM HCL 2 MG/2ML IJ SOLN
INTRAMUSCULAR | Status: AC
  Filled 2020-06-11: qty 2

## 2020-06-11 MED ORDER — HEPARIN (PORCINE) IN NACL 1000-0.9 UT/500ML-% IV SOLN
INTRAVENOUS | Status: AC
  Filled 2020-06-11: qty 500

## 2020-06-11 MED ORDER — HEPARIN (PORCINE) IN NACL 1000-0.9 UT/500ML-% IV SOLN
INTRAVENOUS | Status: DC | PRN
  Administered 2020-06-11: 500 mL

## 2020-06-11 MED ORDER — FENTANYL CITRATE (PF) 100 MCG/2ML IJ SOLN
INTRAMUSCULAR | Status: AC
  Filled 2020-06-11: qty 2

## 2020-06-11 MED ORDER — MIDAZOLAM HCL 2 MG/2ML IJ SOLN
INTRAMUSCULAR | Status: DC | PRN
  Administered 2020-06-11: 1 mg via INTRAVENOUS

## 2020-06-11 MED ORDER — LIDOCAINE HCL (PF) 1 % IJ SOLN
INTRAMUSCULAR | Status: AC
  Filled 2020-06-11: qty 30

## 2020-06-11 SURGICAL SUPPLY — 9 items
CATH ANGIO 5F BER 100CM (CATHETERS) ×2 IMPLANT
CATH ANGIO 5F SIM1 100CM (CATHETERS) ×2 IMPLANT
CATHETER LAUNCHER 6FR MP1 (CATHETERS) ×2 IMPLANT
DEVICE ONE SNARE 25MM (VASCULAR PRODUCTS) ×2 IMPLANT
GUIDEWIRE ANGLED .035X260CM (WIRE) ×4 IMPLANT
KIT MICROPUNCTURE NIT STIFF (SHEATH) ×2 IMPLANT
SET VENACAVA FILTER RETRIEVAL (MISCELLANEOUS) ×2 IMPLANT
SHEATH PROBE COVER 6X72 (BAG) ×2 IMPLANT
TRAY PV CATH (CUSTOM PROCEDURE TRAY) ×2 IMPLANT

## 2020-06-11 NOTE — Op Note (Signed)
    Patient name: [name] MRN: [id number] DOB: 1941/01/26 Sex: female  06/11/2020 Pre-operative Diagnosis: History of DVT Post-operative diagnosis:  Same Surgeon:  Eda Paschal. Donzetta Matters, MD Procedure Performed: 1.  Ultrasound-guided cannulation right internal jugular vein 2.  Central venogram 3.  Retrieval of IVC filter 4.  Moderate sedation with fentanyl Versed for 39 minutes  Indications: 80 year old female with history of DVT recently went total knee arthroplasty and IVC filter was placed first.  She is now indicated for removal of IVC filter.  Findings: The hook had unfortunately embedded in the left sidewall the IVC.  We were able to place a Glidewire around the top of the filter snare the Glidewire and pulled the filter into the suprarenal position and then snare the hook and remove it in the filter was intact at completion.   Procedure:  The patient was identified in the holding area and taken to room 8.  The patient was then placed supine on the table and prepped and draped in the usual sterile fashion.  A time out was called.  Ultrasound was used to evaluate the right internal jugular vein which was noted to be patent and compressible.  The area was anesthetized 1% lidocaine.  I cannulated the IJ with micropuncture needle followed by wire and sheath.  And images saved the permanent record.  I placed a Bentson wire into the infrarenal IVC and serially dilated the wire tract and then placed the retrieval sheath.  Unfortunately it appeared that the filter was embedded in the wall on the left side of the IVC.  I attempted with multiple catheters to get the snare to the hook but could not.  I then used a Simmons catheter placed a Glidewire up and around of the neck of the filter and then snared the Glidewire.  I was able to pull the Glidewire through under tension and then move the filter to the center of the IVC in the suprarenal position.  I then snared the hook advance the sheath over the  filter and the filter was removed.  Central venogram was performed did not appear to be any perforation.  Filter was noted to be intact.  All wires were removed.  Pressure was held until hemostasis was obtained.  She tolerated procedure well any complication.   Contrast: 10cc  Elizandro Laura C. Donzetta Matters, MD [facility] Office: [phone] Pager: [phone]

## 2020-06-11 NOTE — Discharge Instructions (Signed)
Inferior Vena Cava Filter Removal, Care After This sheet gives you information about how to care for yourself after your procedure. Your health care provider may also give you more specific instructions. If you have problems or questions, contact your health care provider. What can I expect after the procedure? After the procedure, it is common to have:  Mild pain and bruising around the area where the long, thin tube (catheter) was inserted in your neck or groin.  Tiredness (fatigue). Follow these instructions at home: Insertion site care  Follow instructions from your health care provider about how to take care of your catheter insertion site. Make sure you: ? Wash your hands with soap and water for at least 20 seconds before and after you change your bandage (dressing). If soap and water are not available, use hand sanitizer. ? Change your dressing as told by your health care provider.  Check your insertion site every day for signs of infection. Check for: ? Redness, swelling, or more pain. ? Fluid or blood. ? Warmth. ? Pus or a bad smell.   General instructions  Take over-the-counter and prescription medicines only as told by your health care provider.  Do not take baths, swim, or use a hot tub until your health care provider approves. Ask your health care provider if you may take showers.  If you were given a sedative during the procedure, it can affect you for several hours. Do not drive or operate machinery until your health care provider says that it is safe.  Return to your normal activities as told by your health care provider. Ask your health care provider what activities are safe for you.  Keep all follow-up visits as told by your health care provider. This is important. Contact a health care provider if:  You have chills or a fever.  You have redness, swelling, or more pain around your catheter insertion site.  Your insertion site feels warm to the touch.  You have  pus or a bad smell coming from your insertion site. Get help right away if:  You have blood coming from your catheter insertion site (active bleeding). ? If you have bleeding from the insertion site, lie down, apply pressure to the area with a clean cloth or gauze, and get help right away.  You have chest pain.  You have difficulty breathing. Summary  Follow instructions from your health care provider about how to take care of your catheter insertion site.  Return to your normal activities as told by your health care provider.  Check your catheter insertion site every day for signs of infection.  Get help right away if you have active bleeding, chest pain, or trouble breathing. This information is not intended to replace advice given to you by your health care provider. Make sure you discuss any questions you have with your health care provider. Document Revised: 04/13/2019 Document Reviewed: 04/13/2019 Elsevier Patient Education  2021 [employer]  

## 2020-06-11 NOTE — H&P (Signed)
H+P   History of Present Illness: This is a 80 y.o. female with history of dvt. She has had ivc filter placed for R TKA and has done well from surgery. Maintained on aspirin for previous dvt.       Past Medical History:  Diagnosis Date  . Anemia   . Arthritis    osteoarthritis Shoulder,neck  . Bell's palsy 1980s  . CHF (congestive heart failure) ([place]) 06/2019  . Chronic kidney disease    Stage III  . Dyspnea 06/2019   On exertion  . GERD (gastroesophageal reflux disease)   . Hemochromatosis   . History of hiatal hernia   . Hypertension   . Iron deficiency anemia due to chronic blood loss 03/10/2017  . PE (pulmonary embolism) 2007   x2  . Peripheral vascular disease (H[facility])    DVT Rt leg  . Pneumonia 1990s   "walking" pneumonia  . Prothrombin gene mutation ([place]) 05/30/2013  . Restless legs   . Right leg DVT ([place]) 05/30/2013  . Stroke [facility])    found on a MRI, she's not aware otherwise         Past Surgical History:  Procedure Laterality Date  . BACK SURGERY    . CARDIAC CATHETERIZATION Bilateral 2005  . COLONOSCOPY    . RIGHT/LEFT HEART CATH AND CORONARY ANGIOGRAPHY N/A 07/12/2019   Procedure: RIGHT/LEFT HEART CATH AND CORONARY ANGIOGRAPHY;  Surgeon: Troy Sine, MD;  Location: [facility];  Service: Cardiovascular;  Laterality: N/A;  . SHOULDER SURGERY Bilateral     Allergies  Allergen Reactions  . Morphine And Related Other (See Comments)    LARGER DOSES OF MORPHINE CAUSES BODY TWITCHING  . Penicillins Anaphylaxis    Childhood reaction Has patient had a PCN reaction causing immediate rash, facial/tongue/throat swelling, SOB or lightheadedness with hypotension: Yes Has patient had a PCN reaction causing severe rash involving mucus membranes or skin necrosis: Unknown Has patient had a PCN reaction that required hospitalization: No Has patient had a PCN reaction occurring within the last 10 years: no (5 or 80 yrs old) If all  of the above answers are "NO", then may proceed with Cephalosporin use.   . Sulfa Antibiotics Other (See Comments)    Unknown childhood reaction           Prior to Admission medications   Medication Sig Start Date End Date Taking? Authorizing Provider  allopurinol (ZYLOPRIM) 100 MG tablet Take 100 mg by mouth daily. 03/02/20  Yes [provider]  aspirin EC 81 MG tablet Take 162 mg by mouth daily. Swallow whole.   Yes [provider]  atorvastatin (LIPITOR) 40 MG tablet Take 1 tablet (40 mg total) by mouth daily at 6 PM. 07/13/19  Yes Darrick Meigs, Marge Duncans, MD  Biotin 5000 MCG TABS Take 5,000 mcg by mouth daily.    Yes [provider]  Calcium Carb-Cholecalciferol (CALCIUM 600+D3 PO) Take 1 tablet by mouth in the morning and at bedtime.   Yes [provider]  Cholecalciferol (VITAMIN D3) 50 MCG (2000 UT) TABS Take 2,000 Units by mouth in the morning and at bedtime.   Yes [provider]  citalopram (CELEXA) 20 MG tablet Take 20 mg by mouth at bedtime.  08/05/11  Yes [provider]  colchicine 0.6 MG tablet Take 0.6 mg by mouth every other day.  01/20/20  Yes [provider]  cycloSPORINE (RESTASIS) 0.05 % ophthalmic emulsion Place 1 drop into both eyes 2 (two) times daily.  Yes [provider]  ferrous sulfate 325 (65 FE) MG EC tablet Take 325 mg by mouth daily.   Yes [provider]  furosemide (LASIX) 80 MG tablet Take 1 tablet (80 mg total) by mouth daily. 07/13/19 07/12/20 Yes Lama, Marge Duncans, MD  Hypromellose (ARTIFICIAL TEARS OP) Place 1 drop into both eyes 3 (three) times daily as needed (dry eyes).   Yes [provider]  losartan (COZAAR) 50 MG tablet Take 1 tablet (50 mg total) by mouth daily. 07/13/19  Yes Oswald Hillock, MD  metoprolol succinate (TOPROL-XL) 25 MG 24 hr tablet Take 1 tablet (25 mg total) by mouth daily. 11/16/19  Yes Lorretta Harp, MD  Multiple Vitamin (MULTIVITAMIN  WITH MINERALS) TABS tablet Take 1 tablet by mouth daily. Centrum Silver (NO IRON)   Yes [provider]  Omega-3 Fatty Acids (FISH OIL) 1200 MG CAPS Take 1,200 mg by mouth daily.   Yes [provider]  omeprazole (PRILOSEC) 20 MG capsule Take 20 mg by mouth daily before breakfast.  07/04/19  Yes [provider]  polycarbophil (FIBERCON) 625 MG tablet Take 625 mg by mouth in the morning and at bedtime.   Yes [provider]  pramipexole (MIRAPEX) 0.5 MG tablet Take 0.5 mg by mouth daily in the afternoon.   Yes [provider]  temazepam (RESTORIL) 15 MG capsule Take 1 capsule (15 mg total) by mouth at bedtime. 12/02/16  Yes Love, Ivan Anchors, PA-C  Turmeric Curcumin 500 MG CAPS Take 500 mg by mouth at bedtime.    Yes [provider]  vitamin E 180 MG (400 UNITS) capsule Take 400 Units by mouth at bedtime.   Yes [provider]  Omega-3 Fatty Acids (FISH OIL PO) Take 2,000 mg by mouth daily. Patient not taking: Reported on 03/05/2020    [provider]  predniSONE (DELTASONE) 10 MG tablet Take by mouth. Patient not taking: Reported on 03/05/2020 12/14/19   [provider]    Social History        Socioeconomic History  . Marital status: Married    Spouse name: Not on file  . Number of children: Not on file  . Years of education: Not on file  . Highest education level: Not on file  Occupational History  . Not on file  Tobacco Use  . Smoking status: Never Smoker  . Smokeless tobacco: Never Used  . Tobacco comment: never used tobacco  Vaping Use  . Vaping Use: Never used  Substance and Sexual Activity  . Alcohol use: No    Alcohol/week: 0.0 standard drinks  . Drug use: No  . Sexual activity: Not on file  Other Topics Concern  . Not on file  Social History Narrative  . Not on file   Social Determinants of Health      Financial Resource Strain:   . Difficulty of Paying Living  Expenses: Not on file  Food Insecurity:   . Worried About Charity fundraiser in the Last Year: Not on file  . Ran Out of Food in the Last Year: Not on file  Transportation Needs:   . Lack of Transportation (Medical): Not on file  . Lack of Transportation (Non-Medical): Not on file  Physical Activity:   . Days of Exercise per Week: Not on file  . Minutes of Exercise per Session: Not on file  Stress:   . Feeling of Stress : Not on file  Social Connections:   . Frequency  of Communication with Friends and Family: Not on file  . Frequency of Social Gatherings with Friends and Family: Not on file  . Attends Religious Services: Not on file  . Active Member of Clubs or Organizations: Not on file  . Attends Archivist Meetings: Not on file  . Marital Status: Not on file  Intimate Partner Violence:   . Fear of Current or Ex-Partner: Not on file  . Emotionally Abused: Not on file  . Physically Abused: Not on file  . Sexually Abused: Not on file     Family History  Problem Relation Age of Onset  . Congestive Heart Failure Mother   . Pancreatic cancer Father     ROS Cardiovascular: []  chest pain/pressure []  palpitations []  SOB lying flat []  DOE []  pain in legs while walking []  pain in legs at rest []  pain in legs at night []  non-healing ulcers []  hx of DVT []  swelling in legs  Pulmonary: []  productive cough []  asthma/wheezing []  home O2  Neurologic: []  weakness in []  arms []  legs []  numbness in []  arms []  legs []  hx of CVA []  mini stroke [] difficulty speaking or slurred speech []  temporary loss of vision in one eye []  dizziness  Hematologic: []  hx of cancer []  bleeding problems []  problems with blood clotting easily  Endocrine:   []  diabetes []  thyroid disease  GI []  vomiting blood []  blood in stool  GU: []  CKD/renal failure []  HD--[]  M/W/F or []  T/T/S []  burning with urination []  blood in urine  Psychiatric: []  anxiety []   depression  Musculoskeletal: []  arthritis []  joint pain  Integumentary: []  rashes []  ulcers  Constitutional: []  fever []  chills   Physical Examination  Vitals:   06/11/20 0551  BP: (!) 157/62  Pulse: 68  Resp: 16  Temp: 97.7 F (36.5 C)  SpO2: 98%     General:  nad HENT: WNL, normocephalic Pulmonary: normal non-labored breathing Cardiac: palpable pulses Abdomen:  soft, NT/ND, no masses Musculoskeletal: no muscle wasting or atrophy       Neurologic: A&O X 3; Appropriate Affect ; SENSATION: normal; MOTOR FUNCTION:  moving all extremities equally. Speech is fluent/normal   CBC Labs (Brief)          Component Value Date/Time   WBC 6.2 03/09/2020 0854   RBC 3.88 03/09/2020 0854   HGB 11.4 (L) 03/09/2020 0854   HGB 11.3 (L) 11/17/2019 1048   HGB 11.7 04/13/2017 1134   HGB 12.2 06/14/2007 0918   HCT 36.0 03/09/2020 0854   HCT 37.0 04/13/2017 1134   HCT 37.6 06/14/2007 0918   PLT 163 03/09/2020 0854   PLT 233 11/17/2019 1048   PLT 255 04/13/2017 1134   PLT 171 06/14/2007 0918   MCV 92.8 03/09/2020 0854   MCV 84 04/13/2017 1134   MCV 89.8 06/14/2007 0918   MCH 29.4 03/09/2020 0854   MCHC 31.7 03/09/2020 0854   RDW 14.5 03/09/2020 0854   RDW 18.7 (H) 04/13/2017 1134   RDW 13.8 06/14/2007 0918   LYMPHSABS 1.1 11/17/2019 1048   LYMPHSABS 1.4 04/13/2017 1134   LYMPHSABS 1.4 06/14/2007 0918   MONOABS 0.8 11/17/2019 1048   MONOABS 0.5 06/14/2007 0918   EOSABS 0.1 11/17/2019 1048   EOSABS 0.3 04/13/2017 1134   BASOSABS 0.0 11/17/2019 1048   BASOSABS 0.0 04/13/2017 1134   BASOSABS 0.0 06/14/2007 0918      BMET Labs (Brief)          Component Value Date/Time  NA 138 03/09/2020 0854   NA 140 11/15/2019 1023   NA 144 04/13/2017 1134   NA 142 07/28/2016 1144   K 4.2 03/09/2020 0854   K 3.3 04/13/2017 1134   K 3.6 07/28/2016 1144   CL 99 03/09/2020 0854   CL 97 (L) 04/13/2017 1134   CO2 28  03/09/2020 0854   CO2 32 04/13/2017 1134   CO2 33 (H) 07/28/2016 1144   GLUCOSE 92 03/09/2020 0854   GLUCOSE 105 04/13/2017 1134   BUN 43 (H) 03/09/2020 0854   BUN 31 (H) 11/15/2019 1023   BUN 18 04/13/2017 1134   BUN 22.6 07/28/2016 1144   CREATININE 1.44 (H) 03/09/2020 0854   CREATININE 1.04 (H) 05/09/2019 1456   CREATININE 1.0 04/13/2017 1134   CREATININE 1.0 07/28/2016 1144   CALCIUM 9.4 03/09/2020 0854   CALCIUM 10.1 04/13/2017 1134   CALCIUM 9.5 07/28/2016 1144   GFRNONAA 37 (L) 03/09/2020 0854   GFRNONAA 51 (L) 05/09/2019 1456   GFRAA 46 (L) 11/17/2019 1048   GFRAA 59 (L) 05/09/2019 1456      COAGS: Recent Labs       Lab Results  Component Value Date   INR 1.1 03/09/2020   INR 1.0 02/06/2020   INR 1.4 (L) 01/18/2009   PROTIME 16.8 (H) 01/18/2009   PROTIME 32.4 (H) 12/07/2008   PROTIME 24.0 (H) 06/15/2008       ASSESSMENT/PLAN: This is a 80 y.o. female with history of multiple dvt now maintained on aspirin. She had ivc filter placed for R TKA. Plan to remove today.   Lyan Holck C. Donzetta Matters, MD [facility] Office: [phone] Pager: [phone]

## 2020-06-26 DIAGNOSIS — Z79899 Other long term (current) drug therapy: Secondary | ICD-10-CM | POA: Diagnosis not present

## 2020-06-26 DIAGNOSIS — M1009 Idiopathic gout, multiple sites: Secondary | ICD-10-CM | POA: Diagnosis not present

## 2020-07-02 DIAGNOSIS — H18453 Nodular corneal degeneration, bilateral: Secondary | ICD-10-CM | POA: Diagnosis not present

## 2020-07-02 DIAGNOSIS — H52213 Irregular astigmatism, bilateral: Secondary | ICD-10-CM | POA: Diagnosis not present

## 2020-07-02 DIAGNOSIS — H18523 Epithelial (juvenile) corneal dystrophy, bilateral: Secondary | ICD-10-CM | POA: Diagnosis not present

## 2020-07-02 DIAGNOSIS — Z961 Presence of intraocular lens: Secondary | ICD-10-CM | POA: Diagnosis not present

## 2020-07-09 DIAGNOSIS — H52213 Irregular astigmatism, bilateral: Secondary | ICD-10-CM | POA: Diagnosis not present

## 2020-07-09 DIAGNOSIS — H18453 Nodular corneal degeneration, bilateral: Secondary | ICD-10-CM | POA: Diagnosis not present

## 2020-07-09 DIAGNOSIS — Z48810 Encounter for surgical aftercare following surgery on the sense organs: Secondary | ICD-10-CM | POA: Diagnosis not present

## 2020-07-09 DIAGNOSIS — Z961 Presence of intraocular lens: Secondary | ICD-10-CM | POA: Diagnosis not present

## 2020-07-09 DIAGNOSIS — H18523 Epithelial (juvenile) corneal dystrophy, bilateral: Secondary | ICD-10-CM | POA: Diagnosis not present

## 2020-07-23 DIAGNOSIS — E7800 Pure hypercholesterolemia, unspecified: Secondary | ICD-10-CM | POA: Diagnosis not present

## 2020-07-23 DIAGNOSIS — G4700 Insomnia, unspecified: Secondary | ICD-10-CM | POA: Diagnosis not present

## 2020-07-23 DIAGNOSIS — M8580 Other specified disorders of bone density and structure, unspecified site: Secondary | ICD-10-CM | POA: Diagnosis not present

## 2020-07-23 DIAGNOSIS — D508 Other iron deficiency anemias: Secondary | ICD-10-CM | POA: Diagnosis not present

## 2020-07-23 DIAGNOSIS — M1990 Unspecified osteoarthritis, unspecified site: Secondary | ICD-10-CM | POA: Diagnosis not present

## 2020-07-23 DIAGNOSIS — N183 Chronic kidney disease, stage 3 unspecified: Secondary | ICD-10-CM | POA: Diagnosis not present

## 2020-07-23 DIAGNOSIS — I10 Essential (primary) hypertension: Secondary | ICD-10-CM | POA: Diagnosis not present

## 2020-07-23 DIAGNOSIS — M19041 Primary osteoarthritis, right hand: Secondary | ICD-10-CM | POA: Diagnosis not present

## 2020-07-23 DIAGNOSIS — K219 Gastro-esophageal reflux disease without esophagitis: Secondary | ICD-10-CM | POA: Diagnosis not present

## 2020-07-23 DIAGNOSIS — N159 Renal tubulo-interstitial disease, unspecified: Secondary | ICD-10-CM | POA: Diagnosis not present

## 2020-07-23 DIAGNOSIS — F329 Major depressive disorder, single episode, unspecified: Secondary | ICD-10-CM | POA: Diagnosis not present

## 2020-07-26 DIAGNOSIS — M1009 Idiopathic gout, multiple sites: Secondary | ICD-10-CM | POA: Diagnosis not present

## 2020-07-26 DIAGNOSIS — M1120 Other chondrocalcinosis, unspecified site: Secondary | ICD-10-CM | POA: Diagnosis not present

## 2020-07-26 DIAGNOSIS — E669 Obesity, unspecified: Secondary | ICD-10-CM | POA: Diagnosis not present

## 2020-07-26 DIAGNOSIS — M2550 Pain in unspecified joint: Secondary | ICD-10-CM | POA: Diagnosis not present

## 2020-07-26 DIAGNOSIS — Z6830 Body mass index (BMI) 30.0-30.9, adult: Secondary | ICD-10-CM | POA: Diagnosis not present

## 2020-07-26 DIAGNOSIS — M150 Primary generalized (osteo)arthritis: Secondary | ICD-10-CM | POA: Diagnosis not present

## 2020-07-31 NOTE — Progress Notes (Signed)
Cardiology Clinic Note   Patient Name: [name] Date of Encounter: 08/02/2020  Primary Care Provider:  Shirline Frees, MD Primary Cardiologist:  Quay Burow, MD  Patient Profile    [name] 80 year old female presents the clinic today for follow-up evaluation of her hypertension, left BBB, and CHF.  Past Medical History    Past Medical History:  Diagnosis Date  . Anemia   . Arthritis    osteoarthritis Shoulder,neck  . Bell's palsy 1980s  . CHF (congestive heart failure) ([place]) 06/2019  . Chronic kidney disease    Stage III  . Dyspnea 06/2019   On exertion  . GERD (gastroesophageal reflux disease)   . Hemochromatosis   . History of hiatal hernia   . Hypertension   . Iron deficiency anemia due to chronic blood loss 03/10/2017  . PE (pulmonary embolism) 2007   x2  . Peripheral vascular disease (HCC)    DVT Rt leg  . Pneumonia 1990s   "walking" pneumonia  . Prothrombin gene mutation ([place]) 05/30/2013  . Restless legs   . Right leg DVT ([place]) 05/30/2013  . Stroke [facility])    found on a MRI, she's not aware otherwise   Past Surgical History:  Procedure Laterality Date  . BACK SURGERY    . CARDIAC CATHETERIZATION Bilateral 2005  . COLONOSCOPY    . IVC FILTER INSERTION N/A 03/12/2020   Procedure: IVC FILTER INSERTION;  Surgeon: Waynetta Sandy, MD;  Location: [place] [facility];  Service: Cardiovascular;  Laterality: N/A;  . IVC FILTER REMOVAL N/A 06/11/2020   Procedure: IVC FILTER REMOVAL;  Surgeon: Waynetta Sandy, MD;  Location: [place] [facility];  Service: Cardiovascular;  Laterality: N/A;  . RIGHT/LEFT HEART CATH AND CORONARY ANGIOGRAPHY N/A 07/12/2019   Procedure: RIGHT/LEFT HEART CATH AND CORONARY ANGIOGRAPHY;  Surgeon: Troy Sine, MD;  Location: [place] [facility];  Service: Cardiovascular;  Laterality: N/A;  . SHOULDER SURGERY Bilateral   . TOTAL KNEE ARTHROPLASTY Right 03/16/2020   Procedure: TOTAL KNEE ARTHROPLASTY;   Surgeon: Susa Day, MD;  Location: [facility];  Service: Orthopedics;  Laterality: Right;  2.5 hrs    Allergies  Allergies  Allergen Reactions  . Morphine And Related Other (See Comments)    LARGER DOSES OF MORPHINE CAUSES BODY TWITCHING  . Penicillins Anaphylaxis    Childhood reaction Has patient had a PCN reaction causing immediate rash, facial/tongue/throat swelling, SOB or lightheadedness with hypotension: Yes Has patient had a PCN reaction causing severe rash involving mucus membranes or skin necrosis: Unknown Has patient had a PCN reaction that required hospitalization: No Has patient had a PCN reaction occurring within the last 10 years: no (5 or 80 yrs old) If all of the above answers are "NO", then may proceed with Cephalosporin use.   . Sulfa Antibiotics Other (See Comments)    Unknown childhood reaction    History of Present Illness    [name] has a PMH of right leg NKN(3976 on Xarelto complicated by PE), hypertension, LBBB, CHF, respiratory failure with hypoxia, iron deficiency anemia, RLS, hypokalemia, dyspnea, hyperlipidemia, and total knee replacement. She may have also had a remote CVA.  Is noted to have progressive DOE over the last several years.  She was admitted to the hospital 3/21 for 4 days with CHF.  She underwent right and left heart cath by Dr. Claiborne Billings that showed 40% proximal LAD with elevated right atrial pressures, pulmonary artery pressure, wedge pressure and LVEDP.  She was  diuresed at that time.  Her admission weight on 06/03/2019 was 188.  She was  seen by Dr. Gwenlyn Found on 08/16/2019.  During that time she denied chest pain.  Her weight was 172 pounds.  She was following a low-sodium diet, and was taking furosemide 80 mg daily.  She denied shortness of breath.  She presented to the clinic 11/15/2019 for follow-up evaluation and stated she felt well .  She was having some right shoulder and right knee pain.  She was in the emergency department recently with right  shoulder pain and was told it was related to arthritis.  She was also seeing orthopedics for her right knee pain.  She was planning to have a total knee replacement in the future.  She was unsure whether she would pursue this or not.  She stated that her blood pressure was better controlled and that she  had no chest discomfort.  She underwent IVC filter placement and bilateral renal veins 03/12/2020 by Dr. Donzetta Matters.  This was done prior to her right total knee arthroplasty.  Her IVC filters were removed 06/11/2020.  She presents the clinic today for follow-up evaluation and states she feels well.  She has been maintaining her low-sodium diet.  She reports that she has had some pain in her knee since having her knee replacement.  She also noted she was having nosebleeds and was taken off of her Eliquis.  She is now taking 2 baby aspirin and has not had any further episodes of bleeding.  She reports that she has not been very physically active because she is worried about falls.  I have asked her to use her walker on level ground and increase her physical activity.  I will have her maintain her low-sodium diet, increase her physical activity, and follow-up in 6 months with Dr. Gwenlyn Found.  Today she denies chest pain, shortness of breath, lower extremity edema, fatigue, palpitations, melena, hematuria, hemoptysis, diaphoresis, weakness, presyncope, syncope, orthopnea, and PND.   Home Medications    Prior to Admission medications   Medication Sig Start Date End Date Taking? Authorizing Provider  allopurinol (ZYLOPRIM) 100 MG tablet Take 200 mg by mouth daily. 03/02/20   [provider]  aspirin EC 81 MG tablet Take 162 mg by mouth daily. Swallow whole.    [provider]  atorvastatin (LIPITOR) 40 MG tablet Take 1 tablet (40 mg total) by mouth daily at 6 PM. 07/13/19   Oswald Hillock, MD  Biotin 5000 MCG TABS Take 5,000 mcg by mouth daily.     [provider]  Calcium  Carb-Cholecalciferol (CALCIUM 600+D3 PO) Take 1 tablet by mouth in the morning and at bedtime.    [provider]  Cholecalciferol (VITAMIN D3) 50 MCG (2000 UT) TABS Take 2,000 Units by mouth in the morning and at bedtime.    [provider]  citalopram (CELEXA) 20 MG tablet Take 20 mg by mouth at bedtime.  08/05/11   [provider]  colchicine 0.6 MG tablet Take 0.6 mg by mouth every other day.  01/20/20   [provider]  cycloSPORINE (RESTASIS) 0.05 % ophthalmic emulsion Place 1 drop into both eyes 2 (two) times daily.    [provider]  docusate sodium (COLACE) 100 MG capsule Take 1 capsule (100 mg total) by mouth 2 (two) times daily. Patient not taking: No sig reported 03/16/20 03/16/21  Susa Day, MD  ferrous sulfate 325 (65 FE) MG EC tablet Take 325 mg by mouth  daily.    [provider]  furosemide (LASIX) 80 MG tablet Take 1 tablet (80 mg total) by mouth daily. 07/13/19 07/12/20  Oswald Hillock, MD  Hypromellose (ARTIFICIAL TEARS OP) Place 1 drop into both eyes 3 (three) times daily as needed (dry eyes).    [provider]  losartan (COZAAR) 50 MG tablet Take 1 tablet (50 mg total) by mouth daily. 07/13/19   Oswald Hillock, MD  metoprolol succinate (TOPROL-XL) 25 MG 24 hr tablet Take 1 tablet (25 mg total) by mouth daily. 11/16/19   Lorretta Harp, MD  Multiple Vitamin (MULTIVITAMIN WITH MINERALS) TABS tablet Take 1 tablet by mouth daily. Centrum Silver (NO IRON)    [provider]  Omega-3 Fatty Acids (FISH OIL PO) Take 1,200 mg by mouth daily.    [provider]  Omega-3 Fatty Acids (FISH OIL) 1200 MG CAPS Take 1,200 mg by mouth daily.    [provider]  omeprazole (PRILOSEC) 20 MG capsule Take 20 mg by mouth daily before breakfast.  07/04/19   [provider]  oxyCODONE-acetaminophen (PERCOCET/ROXICET) 5-325 MG tablet TAKE 1 TO 2 TABLETS BY MOUTH EVERY 4 HOURS AS NEEDED FOR SEVERE PAIN  03/16/20 09/12/20  Susa Day, MD  polycarbophil (FIBERCON) 625 MG tablet Take 625 mg by mouth in the morning and at bedtime.    [provider]  pramipexole (MIRAPEX) 0.5 MG tablet Take 0.5 mg by mouth daily in the afternoon.    [provider]  temazepam (RESTORIL) 15 MG capsule Take 1 capsule (15 mg total) by mouth at bedtime. 12/02/16   Love, Ivan Anchors, PA-C  Turmeric Curcumin 500 MG CAPS Take 500 mg by mouth at bedtime.     [provider]  vitamin E 180 MG (400 UNITS) capsule Take 400 Units by mouth at bedtime.    [provider]  apixaban (ELIQUIS) 2.5 MG TABS tablet Take 1 tablet (2.5 mg total) by mouth 2 (two) times daily. Patient not taking: No sig reported 03/16/20 06/11/20  Susa Day, MD    Family History    Family History  Problem Relation Age of Onset  . Congestive Heart Failure Mother   . Pancreatic cancer Father    She indicated that her mother is deceased. She indicated that her father is deceased.  Social History    Social History   Socioeconomic History  . Marital status: Married    Spouse name: Not on file  . Number of children: Not on file  . Years of education: Not on file  . Highest education level: Not on file  Occupational History  . Not on file  Tobacco Use  . Smoking status: Never Smoker  . Smokeless tobacco: Never Used  . Tobacco comment: never used tobacco  Vaping Use  . Vaping Use: Never used  Substance and Sexual Activity  . Alcohol use: No    Alcohol/week: 0.0 standard drinks  . Drug use: No  . Sexual activity: Not on file  Other Topics Concern  . Not on file  Social History Narrative  . Not on file   Social Determinants of Health   Financial Resource Strain: Not on file  Food Insecurity: Not on file  Transportation Needs: Not on file  Physical Activity: Not on file  Stress: Not on file  Social Connections: Not on file  Intimate Partner Violence: Not on file     Review of Systems     General:  No chills, fever, night  sweats or weight changes.  Cardiovascular:  No chest pain, dyspnea on exertion, edema, orthopnea, palpitations, paroxysmal nocturnal dyspnea. Dermatological: No rash, lesions/masses Respiratory: No cough, dyspnea Urologic: No hematuria, dysuria Abdominal:   No nausea, vomiting, diarrhea, bright red blood per rectum, melena, or hematemesis Neurologic:  No visual changes, wkns, changes in mental status. All other systems reviewed and are otherwise negative except as noted above.  Physical Exam    VS:  BP 124/66 (BP Location: Left Arm, Patient Position: Sitting, Cuff Size: Normal)   Pulse 65   Ht 5\' 2"  (1.575 m)   Wt 174 lb 6.4 oz (79.1 kg)   SpO2 97%   BMI 31.90 kg/m  , BMI Body mass index is 31.9 kg/m. GEN: Well nourished, well developed, in no acute distress. HEENT: normal. Neck: Supple, no JVD, carotid bruits, or masses. Cardiac: RRR, no murmurs, rubs, or gallops. No clubbing, cyanosis, edema.  Radials/DP/PT 2+ and equal bilaterally.  Respiratory:  Respirations regular and unlabored, clear to auscultation bilaterally. GI: Soft, nontender, nondistended, BS + x 4. MS: no deformity or atrophy. Skin: warm and dry, no rash. Neuro:  Strength and sensation are intact. Psych: Normal affect.  Accessory Clinical Findings    Recent Labs: 05/18/2020: ALT 12; Platelet Count 210 06/11/2020: BUN 52; Creatinine, Ser 1.30; Hemoglobin 12.2; Potassium 4.2; Sodium 141   Recent Lipid Panel    Component Value Date/Time   CHOL 152 07/10/2019 0525   TRIG 75 07/10/2019 0525   HDL 64 07/10/2019 0525   CHOLHDL 2.4 07/10/2019 0525   VLDL 15 07/10/2019 0525   LDLCALC 73 07/10/2019 0525    ECG personally reviewed by me today-none today.  EKG 11/11/2019 Normal sinus rhythm 73 bpm  Echocardiogram 06/20/2019  1. Mild global hypokinesis worse in the septum.. Left ventricular  ejection fraction, by estimation, is 45 to 50%. The left ventricle has  mildly  decreased function. The left ventricle demonstrates global  hypokinesis. The left ventricular internal cavity  size was mildly dilated. There is mild asymmetric left ventricular  hypertrophy. Left ventricular diastolic parameters are consistent with  Grade I diastolic dysfunction (impaired relaxation). Elevated left  ventricular end-diastolic pressure.  2. Right ventricular systolic function is normal. The right ventricular  size is normal. There is normal pulmonary artery systolic pressure.  3. Left atrial size was moderately dilated.  4. The mitral valve is normal in structure and function. Mild mitral  valve regurgitation. No evidence of mitral stenosis.  5. The aortic valve is tricuspid. Aortic valve regurgitation is trivial.  No aortic stenosis is present.  6. The inferior vena cava is normal in size with greater than 50%  respiratory variability, suggesting right atrial pressure of 3 mmHg.  Cardiac catheterization 07/12/2019   Prox LAD lesion is 45% stenosed.  Mild coronary obstructive disease with a smooth area of 40 to 50% proximal LAD stenosis in the region of a large first diagonal vessel. The remainder of the coronary arteries are angiographically normal including a large ramus intermediate vessel, left circumflex vessel, and dominant RCA.  Mild right heart pressure elevation with PA systolic pressure at 37 and mean pressure 25 mmHg.  LVEDP: 25 mmHg  RECOMMENDATION: Medical therapy for mild CAD involving her proximal LAD with guideline directed medical therapy for LV dysfunction.  Assessment & Plan   1.  Coronary artery disease-denies chest pain today.underwent cardiac catheterization 07/12/2019 showed 40% proximal LAD lesion with elevated right atrial pressures, wedge pressure and LVEDP.  Tolerated right knee arthroplasty well and  is increasing physical activity. Continue aspirin, atorvastatin, losartan, metoprolol, Heart healthy low-sodium diet-salty 6  given Increase physical activity as tolerated  Essential hypertension-BP today  124/66.  Well-controlled at home Continue losartan, metoprolol Heart healthy low-sodium diet-salty 6 given Increase physical activity as tolerated  Chronic systolic congestive heart failure-  Euvolemic.  Progress in physical activity after right total knee. Echocardiogram 3/21 showed LVEF 45-50% with G1 DD and normal valvular function.  Cardiac catheterization 07/12/2019 showed 40% proximal LAD lesion with elevated right atrial pressures, wedge pressure and LVEDP. Continue furosemide, losartan, metoprolol Heart healthy low-sodium diet-salty 6 given Increase physical activity as tolerated  Disposition: Follow-up with Dr. Gwenlyn Found in 6 months.  [name] Phillp Dolores NP-C    08/02/2020, 10:39 AM [address] [place] [address] Office [phone] Fax [phone]  Notice: This dictation was prepared with Dragon dictation along with smaller phrase technology. Any transcriptional errors that result from this process are unintentional and may not be corrected upon review.  I spent 10 minutes examining this patient, reviewing medications, and using patient centered shared decision making involving her cardiac care.  Prior to her visit I spent greater than 20 minutes reviewing her past medical history,  medications, and prior cardiac tests.

## 2020-08-02 ENCOUNTER — Encounter: Payer: Self-pay | Admitting: General Practice

## 2020-08-02 ENCOUNTER — Other Ambulatory Visit: Payer: Self-pay

## 2020-08-02 ENCOUNTER — Ambulatory Visit: Payer: PPO | Admitting: General Practice

## 2020-08-02 VITALS — BP 124/66 | HR 65 | Ht 62.0 in | Wt 174.4 lb

## 2020-08-02 DIAGNOSIS — I2510 Atherosclerotic heart disease of native coronary artery without angina pectoris: Secondary | ICD-10-CM | POA: Diagnosis not present

## 2020-08-02 DIAGNOSIS — I5043 Acute on chronic combined systolic (congestive) and diastolic (congestive) heart failure: Secondary | ICD-10-CM | POA: Diagnosis not present

## 2020-08-02 DIAGNOSIS — I10 Essential (primary) hypertension: Secondary | ICD-10-CM | POA: Diagnosis not present

## 2020-08-02 NOTE — Patient Instructions (Signed)
Medication Instructions:  The current medical regimen is effective;  continue present plan and medications as directed. Please refer to the Current Medication list given to you today.  *If you need a refill on your cardiac medications before your next appointment, please call your pharmacy*  Lab Work:   Testing/Procedures:  NONE    NONE  Special Instructions CONTINUE LOW SALT DIET  PLEASE READ AND FOLLOW SALTY 6-ATTACHED-1,800mg  daily  PLEASE INCREASE PHYSICAL ACTIVITY AS TOLERATED-PLEASE WALK IN A SAFE PLACE WITH LEVEL GROUND WITH WALKER  Follow-Up: Your next appointment:  6 month(s) In Person with Quay Burow, MD   Please call our office 2 months in advance to schedule this appointment   At [facility], you and your health needs are our priority.  As part of our continuing mission to provide you with exceptional heart care, we have created designated Provider Care Teams.  These Care Teams include your primary Cardiologist (physician) and Advanced Practice Providers (APPs -  Physician Assistants and Nurse Practitioners) who all work together to provide you with the care you need, when you need it.  We recommend signing up for the patient portal called "MyChart".  Sign up information is provided on this After Visit Summary.  MyChart is used to connect with patients for Virtual Visits (Telemedicine).  Patients are able to view lab/test results, encounter notes, upcoming appointments, etc.  Non-urgent messages can be sent to your provider as well.   To learn more about what you can do with MyChart, go to [email or web address].              6 SALTY THINGS TO AVOID     1,800MG  DAILY

## 2020-08-03 DIAGNOSIS — Z96651 Presence of right artificial knee joint: Secondary | ICD-10-CM | POA: Diagnosis not present

## 2020-08-06 DIAGNOSIS — H0288A Meibomian gland dysfunction right eye, upper and lower eyelids: Secondary | ICD-10-CM | POA: Diagnosis not present

## 2020-08-06 DIAGNOSIS — H0288B Meibomian gland dysfunction left eye, upper and lower eyelids: Secondary | ICD-10-CM | POA: Diagnosis not present

## 2020-08-06 DIAGNOSIS — Z961 Presence of intraocular lens: Secondary | ICD-10-CM | POA: Diagnosis not present

## 2020-08-06 DIAGNOSIS — H04123 Dry eye syndrome of bilateral lacrimal glands: Secondary | ICD-10-CM | POA: Diagnosis not present

## 2020-08-06 DIAGNOSIS — H18523 Epithelial (juvenile) corneal dystrophy, bilateral: Secondary | ICD-10-CM | POA: Diagnosis not present

## 2020-08-06 DIAGNOSIS — H52213 Irregular astigmatism, bilateral: Secondary | ICD-10-CM | POA: Diagnosis not present

## 2020-08-10 ENCOUNTER — Other Ambulatory Visit: Payer: Self-pay | Admitting: Cardiovascular Disease

## 2020-08-14 ENCOUNTER — Other Ambulatory Visit: Payer: Self-pay | Admitting: Family Medicine

## 2020-08-14 DIAGNOSIS — Z1231 Encounter for screening mammogram for malignant neoplasm of breast: Secondary | ICD-10-CM

## 2020-08-23 DIAGNOSIS — M542 Cervicalgia: Secondary | ICD-10-CM | POA: Diagnosis not present

## 2020-08-24 DIAGNOSIS — N159 Renal tubulo-interstitial disease, unspecified: Secondary | ICD-10-CM | POA: Diagnosis not present

## 2020-08-24 DIAGNOSIS — F329 Major depressive disorder, single episode, unspecified: Secondary | ICD-10-CM | POA: Diagnosis not present

## 2020-08-24 DIAGNOSIS — E7800 Pure hypercholesterolemia, unspecified: Secondary | ICD-10-CM | POA: Diagnosis not present

## 2020-08-24 DIAGNOSIS — K219 Gastro-esophageal reflux disease without esophagitis: Secondary | ICD-10-CM | POA: Diagnosis not present

## 2020-08-24 DIAGNOSIS — N183 Chronic kidney disease, stage 3 unspecified: Secondary | ICD-10-CM | POA: Diagnosis not present

## 2020-08-24 DIAGNOSIS — M8580 Other specified disorders of bone density and structure, unspecified site: Secondary | ICD-10-CM | POA: Diagnosis not present

## 2020-08-24 DIAGNOSIS — M1990 Unspecified osteoarthritis, unspecified site: Secondary | ICD-10-CM | POA: Diagnosis not present

## 2020-08-24 DIAGNOSIS — G4700 Insomnia, unspecified: Secondary | ICD-10-CM | POA: Diagnosis not present

## 2020-08-24 DIAGNOSIS — I10 Essential (primary) hypertension: Secondary | ICD-10-CM | POA: Diagnosis not present

## 2020-08-24 DIAGNOSIS — D508 Other iron deficiency anemias: Secondary | ICD-10-CM | POA: Diagnosis not present

## 2020-08-27 ENCOUNTER — Ambulatory Visit
Admission: RE | Admit: 2020-08-27 | Discharge: 2020-08-27 | Disposition: A | Discharge status: Home / Self Care | Payer: PPO | Source: Ambulatory Visit | Attending: Family Medicine | Admitting: Family Medicine

## 2020-08-27 ENCOUNTER — Other Ambulatory Visit: Payer: Self-pay

## 2020-08-27 ENCOUNTER — Other Ambulatory Visit: Payer: Self-pay | Admitting: Family Medicine

## 2020-08-27 DIAGNOSIS — M542 Cervicalgia: Secondary | ICD-10-CM

## 2020-08-27 MED ORDER — IOPAMIDOL (ISOVUE-M 200) INJECTION 41%: 15.0000 mL | Freq: Once | INTRAMUSCULAR | Status: DC

## 2020-09-05 DIAGNOSIS — Z48810 Encounter for surgical aftercare following surgery on the sense organs: Secondary | ICD-10-CM | POA: Diagnosis not present

## 2020-09-05 DIAGNOSIS — H0288B Meibomian gland dysfunction left eye, upper and lower eyelids: Secondary | ICD-10-CM | POA: Diagnosis not present

## 2020-09-05 DIAGNOSIS — H04123 Dry eye syndrome of bilateral lacrimal glands: Secondary | ICD-10-CM | POA: Diagnosis not present

## 2020-09-05 DIAGNOSIS — H0288A Meibomian gland dysfunction right eye, upper and lower eyelids: Secondary | ICD-10-CM | POA: Diagnosis not present

## 2020-09-05 DIAGNOSIS — H18523 Epithelial (juvenile) corneal dystrophy, bilateral: Secondary | ICD-10-CM | POA: Diagnosis not present

## 2020-09-17 DIAGNOSIS — M1A9XX0 Chronic gout, unspecified, without tophus (tophi): Secondary | ICD-10-CM | POA: Diagnosis not present

## 2020-09-17 DIAGNOSIS — E261 Secondary hyperaldosteronism: Secondary | ICD-10-CM | POA: Diagnosis not present

## 2020-09-17 DIAGNOSIS — N183 Chronic kidney disease, stage 3 unspecified: Secondary | ICD-10-CM | POA: Diagnosis not present

## 2020-09-17 DIAGNOSIS — I509 Heart failure, unspecified: Secondary | ICD-10-CM | POA: Diagnosis not present

## 2020-09-17 DIAGNOSIS — M47812 Spondylosis without myelopathy or radiculopathy, cervical region: Secondary | ICD-10-CM | POA: Diagnosis not present

## 2020-09-17 DIAGNOSIS — Z96651 Presence of right artificial knee joint: Secondary | ICD-10-CM | POA: Diagnosis not present

## 2020-09-17 DIAGNOSIS — F3340 Major depressive disorder, recurrent, in remission, unspecified: Secondary | ICD-10-CM | POA: Diagnosis not present

## 2020-09-17 DIAGNOSIS — M159 Polyosteoarthritis, unspecified: Secondary | ICD-10-CM | POA: Diagnosis not present

## 2020-09-17 DIAGNOSIS — I130 Hypertensive heart and chronic kidney disease with heart failure and stage 1 through stage 4 chronic kidney disease, or unspecified chronic kidney disease: Secondary | ICD-10-CM | POA: Diagnosis not present

## 2020-09-25 DIAGNOSIS — I10 Essential (primary) hypertension: Secondary | ICD-10-CM | POA: Diagnosis not present

## 2020-09-25 DIAGNOSIS — K219 Gastro-esophageal reflux disease without esophagitis: Secondary | ICD-10-CM | POA: Diagnosis not present

## 2020-09-25 DIAGNOSIS — F329 Major depressive disorder, single episode, unspecified: Secondary | ICD-10-CM | POA: Diagnosis not present

## 2020-09-25 DIAGNOSIS — D508 Other iron deficiency anemias: Secondary | ICD-10-CM | POA: Diagnosis not present

## 2020-09-25 DIAGNOSIS — M1990 Unspecified osteoarthritis, unspecified site: Secondary | ICD-10-CM | POA: Diagnosis not present

## 2020-09-25 DIAGNOSIS — M8580 Other specified disorders of bone density and structure, unspecified site: Secondary | ICD-10-CM | POA: Diagnosis not present

## 2020-09-25 DIAGNOSIS — N183 Chronic kidney disease, stage 3 unspecified: Secondary | ICD-10-CM | POA: Diagnosis not present

## 2020-09-25 DIAGNOSIS — E7800 Pure hypercholesterolemia, unspecified: Secondary | ICD-10-CM | POA: Diagnosis not present

## 2020-09-25 DIAGNOSIS — G4700 Insomnia, unspecified: Secondary | ICD-10-CM | POA: Diagnosis not present

## 2020-10-03 ENCOUNTER — Other Ambulatory Visit: Payer: Self-pay

## 2020-10-03 ENCOUNTER — Ambulatory Visit
Admission: RE | Admit: 2020-10-03 | Discharge: 2020-10-03 | Disposition: A | Discharge status: Home / Self Care | Payer: PPO | Source: Ambulatory Visit | Attending: Family Medicine | Admitting: Family Medicine

## 2020-10-03 DIAGNOSIS — Z1231 Encounter for screening mammogram for malignant neoplasm of breast: Secondary | ICD-10-CM | POA: Diagnosis not present

## 2020-10-03 DIAGNOSIS — M25811 Other specified joint disorders, right shoulder: Secondary | ICD-10-CM | POA: Diagnosis not present

## 2020-10-15 DIAGNOSIS — H0288B Meibomian gland dysfunction left eye, upper and lower eyelids: Secondary | ICD-10-CM | POA: Diagnosis not present

## 2020-10-15 DIAGNOSIS — H18453 Nodular corneal degeneration, bilateral: Secondary | ICD-10-CM | POA: Diagnosis not present

## 2020-10-15 DIAGNOSIS — H0288A Meibomian gland dysfunction right eye, upper and lower eyelids: Secondary | ICD-10-CM | POA: Diagnosis not present

## 2020-10-15 DIAGNOSIS — H04123 Dry eye syndrome of bilateral lacrimal glands: Secondary | ICD-10-CM | POA: Diagnosis not present

## 2020-10-15 DIAGNOSIS — H52213 Irregular astigmatism, bilateral: Secondary | ICD-10-CM | POA: Diagnosis not present

## 2020-10-15 DIAGNOSIS — H18523 Epithelial (juvenile) corneal dystrophy, bilateral: Secondary | ICD-10-CM | POA: Diagnosis not present

## 2020-10-15 DIAGNOSIS — Z961 Presence of intraocular lens: Secondary | ICD-10-CM | POA: Diagnosis not present

## 2020-10-22 DIAGNOSIS — H52213 Irregular astigmatism, bilateral: Secondary | ICD-10-CM | POA: Diagnosis not present

## 2020-10-22 DIAGNOSIS — Z961 Presence of intraocular lens: Secondary | ICD-10-CM | POA: Diagnosis not present

## 2020-10-22 DIAGNOSIS — H18453 Nodular corneal degeneration, bilateral: Secondary | ICD-10-CM | POA: Diagnosis not present

## 2020-10-22 DIAGNOSIS — Z9841 Cataract extraction status, right eye: Secondary | ICD-10-CM | POA: Diagnosis not present

## 2020-10-22 DIAGNOSIS — Z9842 Cataract extraction status, left eye: Secondary | ICD-10-CM | POA: Diagnosis not present

## 2020-10-22 DIAGNOSIS — H0288B Meibomian gland dysfunction left eye, upper and lower eyelids: Secondary | ICD-10-CM | POA: Diagnosis not present

## 2020-10-22 DIAGNOSIS — H18523 Epithelial (juvenile) corneal dystrophy, bilateral: Secondary | ICD-10-CM | POA: Diagnosis not present

## 2020-10-22 DIAGNOSIS — Z48810 Encounter for surgical aftercare following surgery on the sense organs: Secondary | ICD-10-CM | POA: Diagnosis not present

## 2020-10-22 DIAGNOSIS — H0288A Meibomian gland dysfunction right eye, upper and lower eyelids: Secondary | ICD-10-CM | POA: Diagnosis not present

## 2020-10-25 DIAGNOSIS — I5043 Acute on chronic combined systolic (congestive) and diastolic (congestive) heart failure: Secondary | ICD-10-CM | POA: Diagnosis not present

## 2020-11-02 ENCOUNTER — Encounter (HOSPITAL_BASED_OUTPATIENT_CLINIC_OR_DEPARTMENT_OTHER): Payer: Self-pay

## 2020-11-02 ENCOUNTER — Other Ambulatory Visit: Payer: Self-pay

## 2020-11-02 ENCOUNTER — Emergency Department (HOSPITAL_BASED_OUTPATIENT_CLINIC_OR_DEPARTMENT_OTHER)
Admission: EM | Admit: 2020-11-02 | Discharge: 2020-11-02 | Disposition: A | Discharge status: Home / Self Care | Payer: PPO | Attending: Emergency Medicine | Admitting: Emergency Medicine

## 2020-11-02 DIAGNOSIS — I130 Hypertensive heart and chronic kidney disease with heart failure and stage 1 through stage 4 chronic kidney disease, or unspecified chronic kidney disease: Secondary | ICD-10-CM | POA: Diagnosis not present

## 2020-11-02 DIAGNOSIS — M25512 Pain in left shoulder: Secondary | ICD-10-CM | POA: Diagnosis not present

## 2020-11-02 DIAGNOSIS — R079 Chest pain, unspecified: Secondary | ICD-10-CM | POA: Diagnosis not present

## 2020-11-02 DIAGNOSIS — R072 Precordial pain: Secondary | ICD-10-CM | POA: Diagnosis not present

## 2020-11-02 DIAGNOSIS — Z7982 Long term (current) use of aspirin: Secondary | ICD-10-CM | POA: Insufficient documentation

## 2020-11-02 DIAGNOSIS — Z79899 Other long term (current) drug therapy: Secondary | ICD-10-CM | POA: Diagnosis not present

## 2020-11-02 DIAGNOSIS — M5412 Radiculopathy, cervical region: Secondary | ICD-10-CM | POA: Insufficient documentation

## 2020-11-02 DIAGNOSIS — M79602 Pain in left arm: Secondary | ICD-10-CM | POA: Insufficient documentation

## 2020-11-02 DIAGNOSIS — G8929 Other chronic pain: Secondary | ICD-10-CM

## 2020-11-02 DIAGNOSIS — Z20822 Contact with and (suspected) exposure to covid-19: Secondary | ICD-10-CM | POA: Diagnosis not present

## 2020-11-02 DIAGNOSIS — I509 Heart failure, unspecified: Secondary | ICD-10-CM | POA: Insufficient documentation

## 2020-11-02 DIAGNOSIS — M79642 Pain in left hand: Secondary | ICD-10-CM | POA: Diagnosis not present

## 2020-11-02 DIAGNOSIS — N183 Chronic kidney disease, stage 3 unspecified: Secondary | ICD-10-CM | POA: Diagnosis not present

## 2020-11-02 DIAGNOSIS — Z96651 Presence of right artificial knee joint: Secondary | ICD-10-CM | POA: Insufficient documentation

## 2020-11-02 LAB — BASIC METABOLIC PANEL
Anion gap: 10 (ref 5–15)
BUN: 34 mg/dL — ABNORMAL HIGH (ref 8–23)
CO2: 29 mmol/L (ref 22–32)
Calcium: 9.8 mg/dL (ref 8.9–10.3)
Chloride: 97 mmol/L — ABNORMAL LOW (ref 98–111)
Creatinine, Ser: 1.38 mg/dL — ABNORMAL HIGH (ref 0.44–1.00)
GFR, Estimated: 39 mL/min — ABNORMAL LOW (ref >60)
Glucose, Bld: 96 mg/dL (ref 70–99)
Potassium: 4.4 mmol/L (ref 3.5–5.1)
Sodium: 136 mmol/L (ref 135–145)

## 2020-11-02 LAB — TROPONIN I (HIGH SENSITIVITY): Troponin I (High Sensitivity): 5 ng/L (ref <18)

## 2020-11-02 LAB — CBC WITH DIFFERENTIAL/PLATELET
Abs Immature Granulocytes: 0.02 10*3/uL (ref 0.00–0.07)
Basophils Absolute: 0 10*3/uL (ref 0.0–0.1)
Basophils Relative: 1 %
Eosinophils Absolute: 0.2 10*3/uL (ref 0.0–0.5)
Eosinophils Relative: 4 %
HCT: 36 % (ref 36.0–46.0)
Hemoglobin: 11.6 g/dL — ABNORMAL LOW (ref 12.0–15.0)
Immature Granulocytes: 0 %
Lymphocytes Relative: 17 %
Lymphs Abs: 1.1 10*3/uL (ref 0.7–4.0)
MCH: 30 pg (ref 26.0–34.0)
MCHC: 32.2 g/dL (ref 30.0–36.0)
MCV: 93 fL (ref 80.0–100.0)
Monocytes Absolute: 0.7 10*3/uL (ref 0.1–1.0)
Monocytes Relative: 11 %
Neutro Abs: 4.3 10*3/uL (ref 1.7–7.7)
Neutrophils Relative %: 67 %
Platelets: 168 10*3/uL (ref 150–400)
RBC: 3.87 MIL/uL (ref 3.87–5.11)
RDW: 14.4 % (ref 11.5–15.5)
WBC: 6.3 10*3/uL (ref 4.0–10.5)
nRBC: 0 % (ref 0.0–0.2)

## 2020-11-02 LAB — RESP PANEL BY RT-PCR (FLU A&B, COVID) ARPGX2
Influenza A by PCR: NEGATIVE
Influenza B by PCR: NEGATIVE
SARS Coronavirus 2 by RT PCR: NEGATIVE

## 2020-11-02 LAB — BRAIN NATRIURETIC PEPTIDE: B Natriuretic Peptide: 170.7 pg/mL — ABNORMAL HIGH (ref 0.0–100.0)

## 2020-11-02 MED ORDER — ACETAMINOPHEN 325 MG PO TABS
650.0000 mg | ORAL_TABLET | Freq: Once | ORAL | Status: AC
  Administered 2020-11-02: 650 mg via ORAL
  Filled 2020-11-02: qty 2

## 2020-11-02 MED ORDER — METHYLPREDNISOLONE 4 MG PO TBPK: ORAL_TABLET | ORAL | 0 refills | Status: DC

## 2020-11-02 MED ORDER — PREDNISONE 50 MG PO TABS
60.0000 mg | ORAL_TABLET | Freq: Once | ORAL | Status: AC
  Administered 2020-11-02: 60 mg via ORAL
  Filled 2020-11-02: qty 1

## 2020-11-02 NOTE — ED Provider Notes (Signed)
[facility] EMERGENCY DEPARTMENT Provider Note   CSN: [id number] Arrival date & time: 11/02/20  1324     History Chief Complaint  Patient presents with   Chest Pain    [name] is a 80 y.o. female with a history of degenerative disc disease, arthritis, presenting to Emergency Department with left arm and hand pain.  She reports gradual onset when she woke up yesterday of pain in her left shoulder and the left arm.  She says entire arm hurts.  She denies numbness in her arm.  She is able to lift the arm, but feels it is weak when she is picking things up.  She says the pain radiates from her neck all the way down her shoulder into her hand.  She reports that the day before she was doing a lot of lifting and moving of close and furniture in the closet, wonders whether she overexerted herself.  She has had "arthritis problems in my shoulder".  Usually this gets better with prednisone.  She did call her doctor's office today and was told to come to the emergency department for evaluation for cardiac pain.  She denies any chest pain or pressure.  She denies to me any other active symptoms.  HPI     Past Medical History:  Diagnosis Date   Anemia    Arthritis    osteoarthritis Shoulder,neck   Bell's palsy 1980s   CHF (congestive heart failure) ([place]) 06/2019   Chronic kidney disease    Stage III   Dyspnea 06/2019   On exertion   GERD (gastroesophageal reflux disease)    Hemochromatosis    History of hiatal hernia    Hypertension    Iron deficiency anemia due to chronic blood loss 03/10/2017   PE (pulmonary embolism) 2007   x2   Peripheral vascular disease (HCC)    DVT Rt leg   Pneumonia 1990s   "walking" pneumonia   Prothrombin gene mutation ([place]) 05/30/2013   Restless legs    Right leg DVT ([place]) 05/30/2013   Stroke ([place])    found on a MRI, she's not aware otherwise    Patient Active Problem List   Diagnosis Date Noted   S/P TKR (total knee replacement) using  cement 03/16/2020   Hyperlipidemia 01/31/2020   AKI (acute kidney injury) ([place]) 09/15/2019   Right flank pain 09/15/2019   Anemia 09/15/2019   Abnormal nuclear cardiac imaging test    CHF (congestive heart failure) ([place]) 07/09/2019   Dyspnea 07/09/2019   Respiratory failure with hypoxia ([place]) 07/09/2019   Shoulder pain 07/09/2019   Dyspnea on exertion 06/03/2019   Bilateral lower extremity edema 06/03/2019   Left bundle branch block 06/03/2019   Iron deficiency anemia due to chronic blood loss 03/10/2017   Postoperative wound dehiscence 12/02/2016   E. coli UTI    Abnormal urinalysis    Medication side effect    Neuropathic pain    History of DVT (deep vein thrombosis)    HTN (hypertension)    RLS (restless legs syndrome)    Urinary retention    Hypokalemia    Hypoalbuminemia due to protein-calorie malnutrition (HCC)    Acute blood loss anemia    Lumbar stenosis with neurogenic claudication 11/18/2016   Spondylolisthesis of lumbar region 11/12/2016   Hereditary hemochromatosis ([place]) 07/28/2016   Prothrombin gene mutation ([place]) 05/30/2013   Right leg DVT ([place]) 05/30/2013   Hemochromatosis 07/29/2012    Past Surgical History:  Procedure Laterality Date  BACK SURGERY     CARDIAC CATHETERIZATION Bilateral 2005   COLONOSCOPY     IVC FILTER INSERTION N/A 03/12/2020   Procedure: IVC FILTER INSERTION;  Surgeon: Waynetta Sandy, MD;  Location: [facility];  Service: Cardiovascular;  Laterality: N/A;   IVC FILTER REMOVAL N/A 06/11/2020   Procedure: IVC FILTER REMOVAL;  Surgeon: Waynetta Sandy, MD;  Location: [facility];  Service: Cardiovascular;  Laterality: N/A;   RIGHT/LEFT HEART CATH AND CORONARY ANGIOGRAPHY N/A 07/12/2019   Procedure: RIGHT/LEFT HEART CATH AND CORONARY ANGIOGRAPHY;  Surgeon: Troy Sine, MD;  Location: [facility];  Service: Cardiovascular;  Laterality: N/A;   SHOULDER SURGERY Bilateral    TOTAL KNEE ARTHROPLASTY  Right 03/16/2020   Procedure: TOTAL KNEE ARTHROPLASTY;  Surgeon: Susa Day, MD;  Location: [facility];  Service: Orthopedics;  Laterality: Right;  2.5 hrs     OB History   No obstetric history on file.     Family History  Problem Relation Age of Onset   Congestive Heart Failure Mother    Pancreatic cancer Father     Social History   Tobacco Use   Smoking status: Never   Smokeless tobacco: Never   Tobacco comments:    never used tobacco  Vaping Use   Vaping Use: Never used  Substance Use Topics   Alcohol use: No    Alcohol/week: 0.0 standard drinks   Drug use: No    Home Medications Prior to Admission medications   Medication Sig Start Date End Date Taking? Authorizing Provider  methylPREDNISolone (MEDROL DOSEPAK) 4 MG TBPK tablet Use as directed 11/03/20  Yes Brittnae Aschenbrenner, Carola Rhine, MD  allopurinol (ZYLOPRIM) 100 MG tablet Take 200 mg by mouth daily. 03/02/20   [provider]  aspirin EC 81 MG tablet Take 162 mg by mouth daily. Swallow whole.    [provider]  atorvastatin (LIPITOR) 40 MG tablet Take 1 tablet (40 mg total) by mouth daily at 6 PM. 07/13/19   Oswald Hillock, MD  Biotin 5000 MCG TABS Take 5,000 mcg by mouth daily.     [provider]  Calcium Carb-Cholecalciferol (CALCIUM 600+D3 PO) Take 1 tablet by mouth in the morning and at bedtime.    [provider]  Cholecalciferol (VITAMIN D3) 50 MCG (2000 UT) TABS Take 2,000 Units by mouth in the morning and at bedtime.    [provider]  citalopram (CELEXA) 20 MG tablet Take 20 mg by mouth at bedtime.  08/05/11   [provider]  colchicine 0.6 MG tablet Take 0.6 mg by mouth every other day.  01/20/20   [provider]  cycloSPORINE (RESTASIS) 0.05 % ophthalmic emulsion Place 1 drop into both eyes 2 (two) times daily.    [provider]  docusate sodium (COLACE) 100 MG capsule Take 1 capsule (100 mg total) by mouth 2 (two) times daily. Patient not  taking: No sig reported 03/16/20 03/16/21  Susa Day, MD  ferrous sulfate 325 (65 FE) MG EC tablet Take 325 mg by mouth daily.    [provider]  furosemide (LASIX) 80 MG tablet Take 1 tablet (80 mg total) by mouth daily. 07/13/19 07/12/20  Oswald Hillock, MD  Hypromellose (ARTIFICIAL TEARS OP) Place 1 drop into both eyes 3 (three) times daily as needed (dry eyes).    [provider]  losartan (COZAAR) 50 MG tablet Take 1 tablet (50 mg total) by mouth daily. 07/13/19   Eleonore Chiquito  S, MD  metoprolol succinate (TOPROL-XL) 25 MG 24 hr tablet TAKE ONE TABLET BY MOUTH ONCE DAILY 08/10/20   Lorretta Harp, MD  Multiple Vitamin (MULTIVITAMIN WITH MINERALS) TABS tablet Take 1 tablet by mouth daily. Centrum Silver (NO IRON)    [provider]  Omega-3 Fatty Acids (FISH OIL PO) Take 1,200 mg by mouth daily.    [provider]  Omega-3 Fatty Acids (FISH OIL) 1200 MG CAPS Take 1,200 mg by mouth daily.    [provider]  omeprazole (PRILOSEC) 20 MG capsule Take 20 mg by mouth daily before breakfast.  07/04/19   [provider]  polycarbophil (FIBERCON) 625 MG tablet Take 625 mg by mouth in the morning and at bedtime.    [provider]  pramipexole (MIRAPEX) 0.5 MG tablet Take 0.5 mg by mouth daily in the afternoon.    [provider]  temazepam (RESTORIL) 15 MG capsule Take 1 capsule (15 mg total) by mouth at bedtime. 12/02/16   Love, Ivan Anchors, PA-C  Turmeric Curcumin 500 MG CAPS Take 500 mg by mouth at bedtime.     [provider]  vitamin E 180 MG (400 UNITS) capsule Take 400 Units by mouth at bedtime.    [provider]    Allergies    Morphine and related, Penicillins, and Sulfa antibiotics  Review of Systems   Review of Systems  Constitutional:  Negative for chills and fever.  Respiratory:  Negative for cough and shortness of breath.   Cardiovascular:  Negative for chest pain and palpitations.   Gastrointestinal:  Negative for abdominal pain and vomiting.  Genitourinary:  Negative for dysuria and hematuria.  Musculoskeletal:  Positive for arthralgias and myalgias.  Skin:  Negative for color change and rash.  Neurological:  Negative for weakness and numbness.  All other systems reviewed and are negative.  Physical Exam Updated Vital Signs BP (!) 150/65   Pulse 69   Temp 98.5 F (36.9 C)   Resp 20   Ht 5\' 2"  (1.575 m)   Wt 77.1 kg   SpO2 96%   BMI 31.09 kg/m   Physical Exam Constitutional:      General: She is not in acute distress. HENT:     Head: Normocephalic and atraumatic.  Eyes:     Conjunctiva/sclera: Conjunctivae normal.     Pupils: Pupils are equal, round, and reactive to light.  Cardiovascular:     Rate and Rhythm: Normal rate and regular rhythm.  Pulmonary:     Effort: Pulmonary effort is normal. No respiratory distress.  Abdominal:     General: There is no distension.     Tenderness: There is no abdominal tenderness.  Musculoskeletal:     Comments: Left trapezius trigger point tenderness Suprascapular tenderness  Skin:    General: Skin is warm and dry.  Neurological:     General: No focal deficit present.     Mental Status: She is alert. Mental status is at baseline.     GCS: GCS eye subscore is 4. GCS verbal subscore is 5. GCS motor subscore is 6.     Cranial Nerves: Cranial nerves are intact.     Sensory: Sensation is intact.     Motor: Motor function is intact.  Psychiatric:        Mood and Affect: Mood normal.        Behavior: Behavior normal.    ED Results / Procedures / Treatments   Labs (all labs ordered are listed,  but only abnormal results are displayed) Labs Reviewed  CBC WITH DIFFERENTIAL/PLATELET - Abnormal; Notable for the following components:      Result Value   Hemoglobin 11.6 (*)    All other components within normal limits  BASIC METABOLIC PANEL - Abnormal; Notable for the following components:   Chloride 97 (*)     BUN 34 (*)    Creatinine, Ser 1.38 (*)    GFR, Estimated 39 (*)    All other components within normal limits  BRAIN NATRIURETIC PEPTIDE - Abnormal; Notable for the following components:   B Natriuretic Peptide 170.7 (*)    All other components within normal limits  RESP PANEL BY RT-PCR (FLU A&B, COVID) ARPGX2  TROPONIN I (HIGH SENSITIVITY)    EKG None  Radiology No results found.  Procedures Procedures   Medications Ordered in ED Medications  predniSONE (DELTASONE) tablet 60 mg (60 mg Oral Given 11/02/20 1055)  acetaminophen (TYLENOL) tablet 650 mg (650 mg Oral Given 11/02/20 1055)    ED Course  I have reviewed the triage vital signs and the nursing notes.  Pertinent labs & imaging results that were available during my care of the patient were reviewed by me and considered in my medical decision making (see chart for details).  Left shoulder and arm pain, acute on chronic Ddx includes cervical radiculopathy vs peripheral nerve impingement vs bursitis vs atypical ACS vs other  She has excellent grip strength and does not have numbness or weakness on exam, although her overhead arm raise is limited by pain in her shoulder.  I suspect this may be a bursitis or an issue with arthritis of the shoulder, but cannot exclude cervical radiculopathy.  Remainder of her lab work was reassuring.  Her troponin is 5.  Her EKG shows no acute ischemic findings, or no significant changes from her prior.  Her COVID is negative and her basic labs are at baseline.  I have a lower suspicion for ACS, PE, pneumothorax, pneumonia, or other life-threatening based on her clinical exam and work-up.  We started her on some steroids today and give her some Tylenol.  I offered her a sling for comfort.  Advised that she follow-up with her PCP to discuss an MRI of the cervical spine.  She verbalized understanding.  Her husband was present for the full conversation.  Although was noted in triage the patient been  complaining about diarrhea and lightheadedness, the patient denied that she was having any other active symptoms to me.  Clinical Course as of 11/02/20 1425  Fri Nov 02, 2020  1012 Not ideal sign or EKG.  This shows a left bundle branch block of the sinus rhythm with no significant changes from her June 2021 EKG.  She does have occasional PVCs. [MT]    Clinical Course User Index [MT] Olden Klauer, Carola Rhine, MD    Final Clinical Impression(s) / ED Diagnoses Final diagnoses:  Chronic left shoulder pain  Cervical radiculopathy    Rx / DC Orders ED Discharge Orders          Ordered    methylPREDNISolone (MEDROL DOSEPAK) 4 MG TBPK tablet        11/02/20 1144             Wyvonnia Dusky, MD 11/02/20 1425

## 2020-11-02 NOTE — ED Notes (Signed)
Pt appears anxious during assessment, she states that "home health told me I was likely having a heart attack". Pt reports she has recently "canned vegetables" and "messed with some heavy laundry in the closet" yesterday morning when her left arm started to hurt. Pt denies pain in neck at this time.

## 2020-11-02 NOTE — Discharge Instructions (Addendum)
Please call your primary care doctor's office about your shoulder and arm pain.  This may be related to a pinched nerve in your neck or shoulder.  I would recommend that your doctor try to set you up for an MRI of your cervical spine.  If your doctor cannot do this or is unavailable, you can try to make an appointment at the [facility] office.  You can use a sling for comfort as needed.  If it is not helping you do not need to wear it.

## 2020-11-02 NOTE — ED Triage Notes (Addendum)
Pt arrives with c/o left chest and shoulder pain since yesterday, pt reports history of heart disease and CHF. Pt reports some dizziness why lying flat. Pt also reports diarrhea all week after eating and some pain in her right hip.

## 2020-11-02 NOTE — ED Notes (Signed)
ED Provider at bedside. 

## 2020-11-08 DIAGNOSIS — M542 Cervicalgia: Secondary | ICD-10-CM | POA: Diagnosis not present

## 2020-11-13 DIAGNOSIS — I10 Essential (primary) hypertension: Secondary | ICD-10-CM | POA: Diagnosis not present

## 2020-11-13 DIAGNOSIS — N159 Renal tubulo-interstitial disease, unspecified: Secondary | ICD-10-CM | POA: Diagnosis not present

## 2020-11-13 DIAGNOSIS — N183 Chronic kidney disease, stage 3 unspecified: Secondary | ICD-10-CM | POA: Diagnosis not present

## 2020-11-13 DIAGNOSIS — M1990 Unspecified osteoarthritis, unspecified site: Secondary | ICD-10-CM | POA: Diagnosis not present

## 2020-11-13 DIAGNOSIS — F329 Major depressive disorder, single episode, unspecified: Secondary | ICD-10-CM | POA: Diagnosis not present

## 2020-11-13 DIAGNOSIS — G4700 Insomnia, unspecified: Secondary | ICD-10-CM | POA: Diagnosis not present

## 2020-11-13 DIAGNOSIS — M8580 Other specified disorders of bone density and structure, unspecified site: Secondary | ICD-10-CM | POA: Diagnosis not present

## 2020-11-13 DIAGNOSIS — K219 Gastro-esophageal reflux disease without esophagitis: Secondary | ICD-10-CM | POA: Diagnosis not present

## 2020-11-13 DIAGNOSIS — E7800 Pure hypercholesterolemia, unspecified: Secondary | ICD-10-CM | POA: Diagnosis not present

## 2020-11-13 DIAGNOSIS — I5043 Acute on chronic combined systolic (congestive) and diastolic (congestive) heart failure: Secondary | ICD-10-CM | POA: Diagnosis not present

## 2020-11-13 DIAGNOSIS — D508 Other iron deficiency anemias: Secondary | ICD-10-CM | POA: Diagnosis not present

## 2020-11-14 DIAGNOSIS — G259 Extrapyramidal and movement disorder, unspecified: Secondary | ICD-10-CM | POA: Diagnosis not present

## 2020-11-14 DIAGNOSIS — G250 Essential tremor: Secondary | ICD-10-CM | POA: Diagnosis not present

## 2020-11-14 DIAGNOSIS — Z0000 Encounter for general adult medical examination without abnormal findings: Secondary | ICD-10-CM | POA: Diagnosis not present

## 2020-11-14 DIAGNOSIS — R040 Epistaxis: Secondary | ICD-10-CM | POA: Diagnosis not present

## 2020-11-14 DIAGNOSIS — I10 Essential (primary) hypertension: Secondary | ICD-10-CM | POA: Diagnosis not present

## 2020-11-14 DIAGNOSIS — N183 Chronic kidney disease, stage 3 unspecified: Secondary | ICD-10-CM | POA: Diagnosis not present

## 2020-11-14 DIAGNOSIS — F419 Anxiety disorder, unspecified: Secondary | ICD-10-CM | POA: Diagnosis not present

## 2020-11-14 DIAGNOSIS — E7800 Pure hypercholesterolemia, unspecified: Secondary | ICD-10-CM | POA: Diagnosis not present

## 2020-11-14 DIAGNOSIS — K219 Gastro-esophageal reflux disease without esophagitis: Secondary | ICD-10-CM | POA: Diagnosis not present

## 2020-11-14 DIAGNOSIS — D508 Other iron deficiency anemias: Secondary | ICD-10-CM | POA: Diagnosis not present

## 2020-11-15 ENCOUNTER — Other Ambulatory Visit: Payer: PPO

## 2020-11-15 ENCOUNTER — Ambulatory Visit: Payer: PPO | Admitting: Family

## 2020-11-16 ENCOUNTER — Inpatient Hospital Stay: Payer: PPO | Attending: Hematology & Oncology

## 2020-11-16 ENCOUNTER — Other Ambulatory Visit: Payer: Self-pay

## 2020-11-16 ENCOUNTER — Encounter: Payer: Self-pay | Admitting: Family

## 2020-11-16 ENCOUNTER — Inpatient Hospital Stay: Payer: PPO | Admitting: Family

## 2020-11-16 VITALS — BP 113/48 | HR 65 | Temp 98.0°F | Resp 20 | Ht 62.0 in | Wt 176.4 lb

## 2020-11-16 DIAGNOSIS — D6852 Prothrombin gene mutation: Secondary | ICD-10-CM

## 2020-11-16 DIAGNOSIS — D500 Iron deficiency anemia secondary to blood loss (chronic): Secondary | ICD-10-CM

## 2020-11-16 DIAGNOSIS — Z86718 Personal history of other venous thrombosis and embolism: Secondary | ICD-10-CM | POA: Diagnosis not present

## 2020-11-16 DIAGNOSIS — I82401 Acute embolism and thrombosis of unspecified deep veins of right lower extremity: Secondary | ICD-10-CM | POA: Diagnosis not present

## 2020-11-16 DIAGNOSIS — Z7982 Long term (current) use of aspirin: Secondary | ICD-10-CM | POA: Diagnosis not present

## 2020-11-16 LAB — CBC WITH DIFFERENTIAL (CANCER CENTER ONLY)
Abs Immature Granulocytes: 0.03 10*3/uL (ref 0.00–0.07)
Basophils Absolute: 0 10*3/uL (ref 0.0–0.1)
Basophils Relative: 0 %
Eosinophils Absolute: 0.3 10*3/uL (ref 0.0–0.5)
Eosinophils Relative: 4 %
HCT: 36.1 % (ref 36.0–46.0)
Hemoglobin: 11.5 g/dL — ABNORMAL LOW (ref 12.0–15.0)
Immature Granulocytes: 0 %
Lymphocytes Relative: 22 %
Lymphs Abs: 1.5 10*3/uL (ref 0.7–4.0)
MCH: 29.9 pg (ref 26.0–34.0)
MCHC: 31.9 g/dL (ref 30.0–36.0)
MCV: 94 fL (ref 80.0–100.0)
Monocytes Absolute: 0.6 10*3/uL (ref 0.1–1.0)
Monocytes Relative: 8 %
Neutro Abs: 4.4 10*3/uL (ref 1.7–7.7)
Neutrophils Relative %: 66 %
Platelet Count: 151 10*3/uL (ref 150–400)
RBC: 3.84 MIL/uL — ABNORMAL LOW (ref 3.87–5.11)
RDW: 14.6 % (ref 11.5–15.5)
WBC Count: 6.7 10*3/uL (ref 4.0–10.5)
nRBC: 0 % (ref 0.0–0.2)

## 2020-11-16 LAB — CMP (CANCER CENTER ONLY)
ALT: 15 U/L (ref 0–44)
AST: 23 U/L (ref 15–41)
Albumin: 3.9 g/dL (ref 3.5–5.0)
Alkaline Phosphatase: 101 U/L (ref 38–126)
Anion gap: 9 (ref 5–15)
BUN: 39 mg/dL — ABNORMAL HIGH (ref 8–23)
CO2: 32 mmol/L (ref 22–32)
Calcium: 10.3 mg/dL (ref 8.9–10.3)
Chloride: 99 mmol/L (ref 98–111)
Creatinine: 1.46 mg/dL — ABNORMAL HIGH (ref 0.44–1.00)
GFR, Estimated: 36 mL/min — ABNORMAL LOW (ref >60)
Glucose, Bld: 99 mg/dL (ref 70–99)
Potassium: 4 mmol/L (ref 3.5–5.1)
Sodium: 140 mmol/L (ref 135–145)
Total Bilirubin: 0.5 mg/dL (ref 0.3–1.2)
Total Protein: 7 g/dL (ref 6.5–8.1)

## 2020-11-16 NOTE — Progress Notes (Signed)
Hematology and Oncology Follow Up Visit  CAMI HEATHCOTE [id number] Mar 05, 1941 80 y.o. 11/16/2020   Principle Diagnosis:  DVT of the right leg Prothrombin II gene mutation Hemachromatosis (homozygous for C282Y  Mutation) Past history of right lower extremity DVT/PE Iron deficiency anemia secondary to blood loss   Current Therapy:        Aspirin 162 mg PO daily IV iron as indicated    Interim History:  Ms. Petrasek is here today for follow-up. She is doing well but has had a little trouble with fluid on the left knee . She has been elevated her left leg which has helped.  She has fatigue at times and takes a break to rest when needed.  She has generalized aches and pains due to arthritis. She states that she will be having an MRI soon to further assess for cause of neck and left shoulder pain.  No fever, chills, n/v, cough, rash, SOB, chest pain, palpitations, abdominal pain or changes in bowel or bladder habits.  She has occasional dizziness secondary to vertigo.  She is tolerating 2 baby aspirin daily. No issues with bleeding, bruising or petechiae recently. She has had several brief nose bleeds in the past which took less than 10 minutes to stop.  No falls or syncope to report.  She has a little puffiness in her left foot that comes and goes. Pedal pulses are 2+.  She has maintained a good appetite and is staying well hydrated. Her weight is stable at 176 lbs.   ECOG Performance Status: 1 - Symptomatic but completely ambulatory  Medications:  Allergies as of 11/16/2020       Reactions   Morphine And Related Other (See Comments)   LARGER DOSES OF MORPHINE CAUSES BODY TWITCHING   Penicillins Anaphylaxis   Childhood reaction Has patient had a PCN reaction causing immediate rash, facial/tongue/throat swelling, SOB or lightheadedness with hypotension: Yes Has patient had a PCN reaction causing severe rash involving mucus membranes or skin necrosis: Unknown Has patient had a PCN  reaction that required hospitalization: No Has patient had a PCN reaction occurring within the last 10 years: no (5 or 80 yrs old) If all of the above answers are "NO", then may proceed with Cephalosporin use.   Sulfa Antibiotics Other (See Comments)   Unknown childhood reaction        Medication List        Accurate as of November 16, 2020  1:22 PM. If you have any questions, ask your nurse or doctor.          allopurinol 100 MG tablet Commonly known as: ZYLOPRIM Take 200 mg by mouth daily.   ARTIFICIAL TEARS OP Place 1 drop into both eyes 3 (three) times daily as needed (dry eyes).   aspirin EC 81 MG tablet Take 162 mg by mouth daily. Swallow whole.   atorvastatin 40 MG tablet Commonly known as: LIPITOR Take 1 tablet (40 mg total) by mouth daily at 6 PM.   Biotin 5000 MCG Tabs Take 5,000 mcg by mouth daily.   CALCIUM 600+D3 PO Take 1 tablet by mouth in the morning and at bedtime.   citalopram 20 MG tablet Commonly known as: CELEXA Take 20 mg by mouth at bedtime.   colchicine 0.6 MG tablet Take 0.6 mg by mouth every other day.   cycloSPORINE 0.05 % ophthalmic emulsion Commonly known as: RESTASIS Place 1 drop into both eyes 2 (two) times daily.   docusate sodium 100 MG capsule Commonly  known as: Colace Take 1 capsule (100 mg total) by mouth 2 (two) times daily.   ferrous sulfate 325 (65 FE) MG EC tablet Take 325 mg by mouth daily.   Fish Oil 1200 MG Caps Take 1,200 mg by mouth daily.   FISH OIL PO Take 1,200 mg by mouth daily.   furosemide 80 MG tablet Commonly known as: Lasix Take 1 tablet (80 mg total) by mouth daily.   losartan 50 MG tablet Commonly known as: COZAAR Take 1 tablet (50 mg total) by mouth daily.   methylPREDNISolone 4 MG Tbpk tablet Commonly known as: MEDROL DOSEPAK Use as directed   metoprolol succinate 25 MG 24 hr tablet Commonly known as: TOPROL-XL TAKE ONE TABLET BY MOUTH ONCE DAILY   multivitamin with minerals Tabs  tablet Take 1 tablet by mouth daily. Centrum Silver (NO IRON)   omeprazole 20 MG capsule Commonly known as: PRILOSEC Take 20 mg by mouth daily before breakfast.   polycarbophil 625 MG tablet Commonly known as: FIBERCON Take 625 mg by mouth in the morning and at bedtime.   pramipexole 0.5 MG tablet Commonly known as: MIRAPEX Take 0.5 mg by mouth daily in the afternoon.   temazepam 15 MG capsule Commonly known as: RESTORIL Take 1 capsule (15 mg total) by mouth at bedtime.   Turmeric Curcumin 500 MG Caps Take 500 mg by mouth at bedtime.   Vitamin D3 50 MCG (2000 UT) Tabs Take 2,000 Units by mouth in the morning and at bedtime.   vitamin E 180 MG (400 UNITS) capsule Take 400 Units by mouth at bedtime.        Allergies:  Allergies  Allergen Reactions   Morphine And Related Other (See Comments)    LARGER DOSES OF MORPHINE CAUSES BODY TWITCHING   Penicillins Anaphylaxis    Childhood reaction Has patient had a PCN reaction causing immediate rash, facial/tongue/throat swelling, SOB or lightheadedness with hypotension: Yes Has patient had a PCN reaction causing severe rash involving mucus membranes or skin necrosis: Unknown Has patient had a PCN reaction that required hospitalization: No Has patient had a PCN reaction occurring within the last 10 years: no (5 or 80 yrs old) If all of the above answers are "NO", then may proceed with Cephalosporin use.    Sulfa Antibiotics Other (See Comments)    Unknown childhood reaction    Past Medical History, Surgical history, Social history, and Family History were reviewed and updated.  Review of Systems: All other 10 point review of systems is negative.   Physical Exam:  vitals were not taken for this visit.   Wt Readings from Last 3 Encounters:  11/02/20 170 lb (77.1 kg)  08/02/20 174 lb 6.4 oz (79.1 kg)  06/11/20 169 lb (76.7 kg)    Ocular: Sclerae unicteric, pupils equal, round and reactive to light Ear-nose-throat:  Oropharynx clear, dentition fair Lymphatic: No cervical or supraclavicular adenopathy Lungs no rales or rhonchi, good excursion bilaterally Heart regular rate and rhythm, no murmur appreciated Abd soft, nontender, positive bowel sounds MSK no focal spinal tenderness, no joint edema Neuro: non-focal, well-oriented, appropriate affect Breasts: Deferred   Lab Results  Component Value Date   WBC 6.7 11/16/2020   HGB 11.5 (L) 11/16/2020   HCT 36.1 11/16/2020   MCV 94.0 11/16/2020   PLT 151 11/16/2020   Lab Results  Component Value Date   FERRITIN 158 05/18/2020   IRON 147 (H) 05/18/2020   TIBC 250 05/18/2020   UIBC 103 (L) 05/18/2020  IRONPCTSAT 59 (H) 05/18/2020   Lab Results  Component Value Date   RETICCTPCT 0.9 09/16/2019   RBC 3.84 (L) 11/16/2020   RETICCTABS 39.7 05/11/2014   No results found for: KPAFRELGTCHN, LAMBDASER, KAPLAMBRATIO No results found for: Kandis Cocking, IGMSERUM No results found for: Odetta Pink, SPEI   Chemistry      Component Value Date/Time   NA 136 11/02/2020 1035   NA 140 11/15/2019 1023   NA 144 04/13/2017 1134   NA 142 07/28/2016 1144   K 4.4 11/02/2020 1035   K 3.3 04/13/2017 1134   K 3.6 07/28/2016 1144   CL 97 (L) 11/02/2020 1035   CL 97 (L) 04/13/2017 1134   CO2 29 11/02/2020 1035   CO2 32 04/13/2017 1134   CO2 33 (H) 07/28/2016 1144   BUN 34 (H) 11/02/2020 1035   BUN 31 (H) 11/15/2019 1023   BUN 18 04/13/2017 1134   BUN 22.6 07/28/2016 1144   CREATININE 1.38 (H) 11/02/2020 1035   CREATININE 1.30 (H) 05/18/2020 1203   CREATININE 1.0 04/13/2017 1134   CREATININE 1.0 07/28/2016 1144      Component Value Date/Time   CALCIUM 9.8 11/02/2020 1035   CALCIUM 10.1 04/13/2017 1134   CALCIUM 9.5 07/28/2016 1144   ALKPHOS 89 05/18/2020 1203   ALKPHOS 103 (H) 04/13/2017 1134   ALKPHOS 84 07/28/2016 1144   AST 20 05/18/2020 1203   AST 24 07/28/2016 1144   ALT 12 05/18/2020 1203    ALT 27 04/13/2017 1134   ALT 17 07/28/2016 1144   BILITOT 0.5 05/18/2020 1203   BILITOT 0.41 07/28/2016 1144       Impression and Plan: Ms. Mangram is a very pleasant 80 yo caucasian female with a chronic DVT in the right lower extremity.  She continues to tolerate 2 baby aspirin daily and will stay on this same regimen.  She has history of hemochromatosis but this has not been a problem for her due to iron deficiency anemia.  Follow-up in 6 months.  She can contact our office with any questions or concerns.   Laverna Peace, NP 7/22/20221:22 PM

## 2020-11-19 LAB — IRON AND TIBC
Iron: 74 ug/dL (ref 41–142)
Saturation Ratios: 29 % (ref 21–57)
TIBC: 254 ug/dL (ref 236–444)
UIBC: 180 ug/dL (ref 120–384)

## 2020-11-19 LAB — FERRITIN: Ferritin: 159 ng/mL (ref 11–307)

## 2020-11-20 ENCOUNTER — Telehealth: Payer: Self-pay | Admitting: *Deleted

## 2020-11-20 NOTE — Telephone Encounter (Signed)
Per 11/16/20 los gave patient upcoming appointments - patient confirmed and gave calendar

## 2020-11-23 DIAGNOSIS — I5043 Acute on chronic combined systolic (congestive) and diastolic (congestive) heart failure: Secondary | ICD-10-CM | POA: Diagnosis not present

## 2020-11-30 DIAGNOSIS — M542 Cervicalgia: Secondary | ICD-10-CM | POA: Diagnosis not present

## 2020-12-07 DIAGNOSIS — H539 Unspecified visual disturbance: Secondary | ICD-10-CM | POA: Diagnosis not present

## 2020-12-07 DIAGNOSIS — H532 Diplopia: Secondary | ICD-10-CM | POA: Diagnosis not present

## 2020-12-07 DIAGNOSIS — Z79899 Other long term (current) drug therapy: Secondary | ICD-10-CM | POA: Diagnosis not present

## 2020-12-07 DIAGNOSIS — H04123 Dry eye syndrome of bilateral lacrimal glands: Secondary | ICD-10-CM | POA: Diagnosis not present

## 2020-12-07 DIAGNOSIS — H18453 Nodular corneal degeneration, bilateral: Secondary | ICD-10-CM | POA: Diagnosis not present

## 2020-12-07 DIAGNOSIS — Z961 Presence of intraocular lens: Secondary | ICD-10-CM | POA: Diagnosis not present

## 2020-12-07 DIAGNOSIS — H0288A Meibomian gland dysfunction right eye, upper and lower eyelids: Secondary | ICD-10-CM | POA: Diagnosis not present

## 2020-12-07 DIAGNOSIS — H0288B Meibomian gland dysfunction left eye, upper and lower eyelids: Secondary | ICD-10-CM | POA: Diagnosis not present

## 2020-12-07 DIAGNOSIS — H18523 Epithelial (juvenile) corneal dystrophy, bilateral: Secondary | ICD-10-CM | POA: Diagnosis not present

## 2020-12-12 DIAGNOSIS — M542 Cervicalgia: Secondary | ICD-10-CM | POA: Diagnosis not present

## 2020-12-13 DIAGNOSIS — E7800 Pure hypercholesterolemia, unspecified: Secondary | ICD-10-CM | POA: Diagnosis not present

## 2020-12-13 DIAGNOSIS — G4700 Insomnia, unspecified: Secondary | ICD-10-CM | POA: Diagnosis not present

## 2020-12-13 DIAGNOSIS — M1990 Unspecified osteoarthritis, unspecified site: Secondary | ICD-10-CM | POA: Diagnosis not present

## 2020-12-13 DIAGNOSIS — I5043 Acute on chronic combined systolic (congestive) and diastolic (congestive) heart failure: Secondary | ICD-10-CM | POA: Diagnosis not present

## 2020-12-13 DIAGNOSIS — M8580 Other specified disorders of bone density and structure, unspecified site: Secondary | ICD-10-CM | POA: Diagnosis not present

## 2020-12-13 DIAGNOSIS — I10 Essential (primary) hypertension: Secondary | ICD-10-CM | POA: Diagnosis not present

## 2020-12-13 DIAGNOSIS — K219 Gastro-esophageal reflux disease without esophagitis: Secondary | ICD-10-CM | POA: Diagnosis not present

## 2020-12-13 DIAGNOSIS — F329 Major depressive disorder, single episode, unspecified: Secondary | ICD-10-CM | POA: Diagnosis not present

## 2020-12-13 DIAGNOSIS — D508 Other iron deficiency anemias: Secondary | ICD-10-CM | POA: Diagnosis not present

## 2020-12-13 DIAGNOSIS — N159 Renal tubulo-interstitial disease, unspecified: Secondary | ICD-10-CM | POA: Diagnosis not present

## 2020-12-13 DIAGNOSIS — N183 Chronic kidney disease, stage 3 unspecified: Secondary | ICD-10-CM | POA: Diagnosis not present

## 2020-12-25 DIAGNOSIS — I5043 Acute on chronic combined systolic (congestive) and diastolic (congestive) heart failure: Secondary | ICD-10-CM | POA: Diagnosis not present

## 2021-01-14 DIAGNOSIS — M25811 Other specified joint disorders, right shoulder: Secondary | ICD-10-CM | POA: Diagnosis not present

## 2021-01-16 ENCOUNTER — Telehealth: Payer: Self-pay | Admitting: *Deleted

## 2021-01-16 DIAGNOSIS — M8580 Other specified disorders of bone density and structure, unspecified site: Secondary | ICD-10-CM | POA: Diagnosis not present

## 2021-01-16 DIAGNOSIS — N183 Chronic kidney disease, stage 3 unspecified: Secondary | ICD-10-CM | POA: Diagnosis not present

## 2021-01-16 DIAGNOSIS — M1990 Unspecified osteoarthritis, unspecified site: Secondary | ICD-10-CM | POA: Diagnosis not present

## 2021-01-16 DIAGNOSIS — K219 Gastro-esophageal reflux disease without esophagitis: Secondary | ICD-10-CM | POA: Diagnosis not present

## 2021-01-16 DIAGNOSIS — E7800 Pure hypercholesterolemia, unspecified: Secondary | ICD-10-CM | POA: Diagnosis not present

## 2021-01-16 DIAGNOSIS — I5043 Acute on chronic combined systolic (congestive) and diastolic (congestive) heart failure: Secondary | ICD-10-CM | POA: Diagnosis not present

## 2021-01-16 DIAGNOSIS — N159 Renal tubulo-interstitial disease, unspecified: Secondary | ICD-10-CM | POA: Diagnosis not present

## 2021-01-16 DIAGNOSIS — I10 Essential (primary) hypertension: Secondary | ICD-10-CM | POA: Diagnosis not present

## 2021-01-16 DIAGNOSIS — G4700 Insomnia, unspecified: Secondary | ICD-10-CM | POA: Diagnosis not present

## 2021-01-16 DIAGNOSIS — D508 Other iron deficiency anemias: Secondary | ICD-10-CM | POA: Diagnosis not present

## 2021-01-16 DIAGNOSIS — F329 Major depressive disorder, single episode, unspecified: Secondary | ICD-10-CM | POA: Diagnosis not present

## 2021-01-16 NOTE — Telephone Encounter (Signed)
Called and lvm of upcoming appointments for surgical clearance per Dr. Onnie Graham - requested call back to confirm - mailed calendar

## 2021-01-23 ENCOUNTER — Inpatient Hospital Stay (HOSPITAL_BASED_OUTPATIENT_CLINIC_OR_DEPARTMENT_OTHER): Payer: PPO | Admitting: Family

## 2021-01-23 ENCOUNTER — Encounter: Payer: Self-pay | Admitting: Family

## 2021-01-23 ENCOUNTER — Inpatient Hospital Stay: Payer: PPO | Attending: Hematology & Oncology

## 2021-01-23 ENCOUNTER — Other Ambulatory Visit: Payer: Self-pay

## 2021-01-23 VITALS — BP 106/56 | HR 66 | Temp 98.1°F | Resp 18 | Wt 176.1 lb

## 2021-01-23 DIAGNOSIS — D500 Iron deficiency anemia secondary to blood loss (chronic): Secondary | ICD-10-CM | POA: Diagnosis not present

## 2021-01-23 DIAGNOSIS — D6852 Prothrombin gene mutation: Secondary | ICD-10-CM

## 2021-01-23 DIAGNOSIS — I82401 Acute embolism and thrombosis of unspecified deep veins of right lower extremity: Secondary | ICD-10-CM | POA: Diagnosis not present

## 2021-01-23 DIAGNOSIS — M25511 Pain in right shoulder: Secondary | ICD-10-CM | POA: Diagnosis not present

## 2021-01-23 DIAGNOSIS — Z7982 Long term (current) use of aspirin: Secondary | ICD-10-CM | POA: Insufficient documentation

## 2021-01-23 DIAGNOSIS — Z86718 Personal history of other venous thrombosis and embolism: Secondary | ICD-10-CM | POA: Insufficient documentation

## 2021-01-23 DIAGNOSIS — Z79899 Other long term (current) drug therapy: Secondary | ICD-10-CM | POA: Diagnosis not present

## 2021-01-23 LAB — IRON AND TIBC
Iron: 73 ug/dL (ref 41–142)
Saturation Ratios: 34 % (ref 21–57)
TIBC: 213 ug/dL — ABNORMAL LOW (ref 236–444)
UIBC: 139 ug/dL (ref 120–384)

## 2021-01-23 LAB — CBC WITH DIFFERENTIAL (CANCER CENTER ONLY)
Abs Immature Granulocytes: 0.01 10*3/uL (ref 0.00–0.07)
Basophils Absolute: 0 10*3/uL (ref 0.0–0.1)
Basophils Relative: 0 %
Eosinophils Absolute: 0.2 10*3/uL (ref 0.0–0.5)
Eosinophils Relative: 5 %
HCT: 36.1 % (ref 36.0–46.0)
Hemoglobin: 11.7 g/dL — ABNORMAL LOW (ref 12.0–15.0)
Immature Granulocytes: 0 %
Lymphocytes Relative: 23 %
Lymphs Abs: 1.1 10*3/uL (ref 0.7–4.0)
MCH: 29.8 pg (ref 26.0–34.0)
MCHC: 32.4 g/dL (ref 30.0–36.0)
MCV: 91.9 fL (ref 80.0–100.0)
Monocytes Absolute: 0.4 10*3/uL (ref 0.1–1.0)
Monocytes Relative: 9 %
Neutro Abs: 2.9 10*3/uL (ref 1.7–7.7)
Neutrophils Relative %: 63 %
Platelet Count: 165 10*3/uL (ref 150–400)
RBC: 3.93 MIL/uL (ref 3.87–5.11)
RDW: 13.7 % (ref 11.5–15.5)
WBC Count: 4.6 10*3/uL (ref 4.0–10.5)
nRBC: 0 % (ref 0.0–0.2)

## 2021-01-23 LAB — CMP (CANCER CENTER ONLY)
ALT: 14 U/L (ref 0–44)
AST: 24 U/L (ref 15–41)
Albumin: 4 g/dL (ref 3.5–5.0)
Alkaline Phosphatase: 84 U/L (ref 38–126)
Anion gap: 9 (ref 5–15)
BUN: 34 mg/dL — ABNORMAL HIGH (ref 8–23)
CO2: 33 mmol/L — ABNORMAL HIGH (ref 22–32)
Calcium: 9.9 mg/dL (ref 8.9–10.3)
Chloride: 99 mmol/L (ref 98–111)
Creatinine: 1.17 mg/dL — ABNORMAL HIGH (ref 0.44–1.00)
GFR, Estimated: 47 mL/min — ABNORMAL LOW (ref >60)
Glucose, Bld: 85 mg/dL (ref 70–99)
Potassium: 3.7 mmol/L (ref 3.5–5.1)
Sodium: 141 mmol/L (ref 135–145)
Total Bilirubin: 0.6 mg/dL (ref 0.3–1.2)
Total Protein: 6.9 g/dL (ref 6.5–8.1)

## 2021-01-23 LAB — FERRITIN: Ferritin: 196 ng/mL (ref 11–307)

## 2021-01-23 NOTE — Progress Notes (Signed)
Hematology and Oncology Follow Up Visit  [name] [id number] Oct 09, 1940 80 y.o. 01/23/2021   Principle Diagnosis:  DVT of the right leg Prothrombin II gene mutation Hemachromatosis (homozygous for C282Y  Mutation) Past history of right lower extremity DVT/PE Iron deficiency anemia secondary to blood loss   Current Therapy:        Aspirin 162 mg PO daily IV iron as indicated    Interim History:  [name] is here today for follow-up. She is doing fairly well but is having right shoulder pain. She states that she will be having surgery to repair her rotator cuff with Dr. Onnie Graham. She is waiting to schedule.  She is doing well on 2 baby aspirin.  No episodes of bleeding. No bruising or petechiae.  No fever, chills, n/v, cough, rash, SOB, chest pain, palpitations, abdominal pain or changes in bowel or bladder habits.  She notes occasional episodes of dizziness and feels that she may have vertigo.  No falls or syncope to report.  She has occasional swelling in the left foot (with foot drop) and avoids salt in her diet which helps to reduce her fluid retention.  No numbness or tingling in her extremities at this time.  She has maintained a good appetite and is staying well hydrated. Her weight is stable at 176 lbs.   ECOG Performance Status: 1 - Symptomatic but completely ambulatory  Medications:  Allergies as of 01/23/2021       Reactions   Morphine And Related Other (See Comments)   LARGER DOSES OF MORPHINE CAUSES BODY TWITCHING   Penicillins Anaphylaxis   Childhood reaction Has patient had a PCN reaction causing immediate rash, facial/tongue/throat swelling, SOB or lightheadedness with hypotension: Yes Has patient had a PCN reaction causing severe rash involving mucus membranes or skin necrosis: Unknown Has patient had a PCN reaction that required hospitalization: No Has patient had a PCN reaction occurring within the last 10 years: no (5 or 80 yrs old) If all of the above  answers are "NO", then may proceed with Cephalosporin use.   Sulfa Antibiotics Other (See Comments)   Unknown childhood reaction        Medication List        Accurate as of January 23, 2021 10:29 AM. If you have any questions, ask your nurse or doctor.          allopurinol 300 MG tablet Commonly known as: ZYLOPRIM Take 300 mg by mouth daily.   ARTIFICIAL TEARS OP Place 1 drop into both eyes 3 (three) times daily as needed (dry eyes).   aspirin EC 81 MG tablet Take 162 mg by mouth daily. Swallow whole.   atorvastatin 40 MG tablet Commonly known as: LIPITOR Take 1 tablet (40 mg total) by mouth daily at 6 PM.   Biotin 5000 MCG Tabs Take 5,000 mcg by mouth daily.   CALCIUM 600+D3 PO Take 1 tablet by mouth in the morning and at bedtime.   citalopram 20 MG tablet Commonly known as: CELEXA Take 20 mg by mouth at bedtime.   colchicine 0.6 MG tablet Take 0.6 mg by mouth every other day. As needed   cycloSPORINE 0.05 % ophthalmic emulsion Commonly known as: RESTASIS Place 1 drop into both eyes 2 (two) times daily.   docusate sodium 100 MG capsule Commonly known as: Colace Take 1 capsule (100 mg total) by mouth 2 (two) times daily.   ferrous sulfate 325 (65 FE) MG EC tablet Take 325 mg by mouth daily.  FISH OIL PO Take 1,200 mg by mouth daily.   furosemide 80 MG tablet Commonly known as: Lasix Take 1 tablet (80 mg total) by mouth daily.   losartan 50 MG tablet Commonly known as: COZAAR Take 1 tablet (50 mg total) by mouth daily.   metoprolol succinate 25 MG 24 hr tablet Commonly known as: TOPROL-XL TAKE ONE TABLET BY MOUTH ONCE DAILY   multivitamin with minerals Tabs tablet Take 1 tablet by mouth daily. Centrum Silver (NO IRON)   omeprazole 20 MG capsule Commonly known as: PRILOSEC Take 20 mg by mouth daily before breakfast.   oxyCODONE-acetaminophen 5-325 MG tablet Commonly known as: PERCOCET/ROXICET Take 1 tablet by mouth 4 (four) times daily  as needed.   polycarbophil 625 MG tablet Commonly known as: FIBERCON Take 625 mg by mouth in the morning and at bedtime.   pramipexole 0.5 MG tablet Commonly known as: MIRAPEX Take 0.5 mg by mouth daily in the afternoon.   temazepam 15 MG capsule Commonly known as: RESTORIL Take 1 capsule (15 mg total) by mouth at bedtime.   Turmeric Curcumin 500 MG Caps Take 500 mg by mouth at bedtime.   Vitamin D3 50 MCG (2000 UT) Tabs Take 2,000 Units by mouth in the morning and at bedtime.   vitamin E 180 MG (400 UNITS) capsule Take 400 Units by mouth at bedtime.        Allergies:  Allergies  Allergen Reactions   Morphine And Related Other (See Comments)    LARGER DOSES OF MORPHINE CAUSES BODY TWITCHING   Penicillins Anaphylaxis    Childhood reaction Has patient had a PCN reaction causing immediate rash, facial/tongue/throat swelling, SOB or lightheadedness with hypotension: Yes Has patient had a PCN reaction causing severe rash involving mucus membranes or skin necrosis: Unknown Has patient had a PCN reaction that required hospitalization: No Has patient had a PCN reaction occurring within the last 10 years: no (5 or 80 yrs old) If all of the above answers are "NO", then may proceed with Cephalosporin use.    Sulfa Antibiotics Other (See Comments)    Unknown childhood reaction    Past Medical History, Surgical history, Social history, and Family History were reviewed and updated.  Review of Systems: All other 10 point review of systems is negative.   Physical Exam:  weight is 176 lb 1.9 oz (79.9 kg). Her oral temperature is 98.1 F (36.7 C). Her blood pressure is 106/56 (abnormal) and her pulse is 66. Her respiration is 18 and oxygen saturation is 99%.   Wt Readings from Last 3 Encounters:  01/23/21 176 lb 1.9 oz (79.9 kg)  11/16/20 176 lb 6.4 oz (80 kg)  11/02/20 170 lb (77.1 kg)    Ocular: Sclerae unicteric, pupils equal, round and reactive to light Ear-nose-throat:  Oropharynx clear, dentition fair Lymphatic: No cervical or supraclavicular adenopathy Lungs no rales or rhonchi, good excursion bilaterally Heart regular rate and rhythm, no murmur appreciated Abd soft, nontender, positive bowel sounds MSK no focal spinal tenderness, no joint edema Neuro: non-focal, well-oriented, appropriate affect Breasts: Deferred   Lab Results  Component Value Date   WBC 4.6 01/23/2021   HGB 11.7 (L) 01/23/2021   HCT 36.1 01/23/2021   MCV 91.9 01/23/2021   PLT 165 01/23/2021   Lab Results  Component Value Date   FERRITIN 159 11/16/2020   IRON 74 11/16/2020   TIBC 254 11/16/2020   UIBC 180 11/16/2020   IRONPCTSAT 29 11/16/2020   Lab Results  Component Value  Date   RETICCTPCT 0.9 09/16/2019   RBC 3.93 01/23/2021   RETICCTABS 39.7 05/11/2014   No results found for: KPAFRELGTCHN, LAMBDASER, KAPLAMBRATIO No results found for: Kandis Cocking, IGMSERUM No results found for: Odetta Pink, SPEI   Chemistry      Component Value Date/Time   NA 140 11/16/2020 1303   NA 140 11/15/2019 1023   NA 144 04/13/2017 1134   NA 142 07/28/2016 1144   K 4.0 11/16/2020 1303   K 3.3 04/13/2017 1134   K 3.6 07/28/2016 1144   CL 99 11/16/2020 1303   CL 97 (L) 04/13/2017 1134   CO2 32 11/16/2020 1303   CO2 32 04/13/2017 1134   CO2 33 (H) 07/28/2016 1144   BUN 39 (H) 11/16/2020 1303   BUN 31 (H) 11/15/2019 1023   BUN 18 04/13/2017 1134   BUN 22.6 07/28/2016 1144   CREATININE 1.46 (H) 11/16/2020 1303   CREATININE 1.0 04/13/2017 1134   CREATININE 1.0 07/28/2016 1144      Component Value Date/Time   CALCIUM 10.3 11/16/2020 1303   CALCIUM 10.1 04/13/2017 1134   CALCIUM 9.5 07/28/2016 1144   ALKPHOS 101 11/16/2020 1303   ALKPHOS 103 (H) 04/13/2017 1134   ALKPHOS 84 07/28/2016 1144   AST 23 11/16/2020 1303   AST 24 07/28/2016 1144   ALT 15 11/16/2020 1303   ALT 27 04/13/2017 1134   ALT 17 07/28/2016 1144    BILITOT 0.5 11/16/2020 1303   BILITOT 0.41 07/28/2016 1144       Impression and Plan: Ms. Byer is a very pleasant 80 yo caucasian female with a chronic DVT in the right lower extremity.  2 baby aspirin daily.  I spoke with Dr. Marin Olp and we will have her hold her 2 baby aspirin starting 5 days prior to her shoulder surgery. She can restart 1 baby aspirin daily the day after surgery for 1 week and then go back to 2 baby aspirin daily.  Iron studies are pending.  Follow-up in 6 months.  She can contact our office with any questions or concerns.   Lottie Dawson, NP 9/28/202210:29 AM

## 2021-01-29 ENCOUNTER — Telehealth: Payer: Self-pay | Admitting: *Deleted

## 2021-01-29 NOTE — Telephone Encounter (Signed)
   [facility] Pre-operative Risk Assessment    Patient Name: [name]  DOB: Jul 04, 1940 MRN: [id number]    Request for surgical clearance:  What type of surgery is being performed? Rt Reverse Shoulder Arthroplasty  When is this surgery scheduled? 03/07/21  What type of clearance is required (medical clearance vs. Pharmacy clearance to hold med vs. Both)? medical  Are there any medications that need to be held prior to surgery and how long? N/a  Practice name and name of physician performing surgery?  [facility] ; Dr [name]  What is the office phone number? [phone]   7.   What is the office fax number? [phone]  8.   Anesthesia type (None, local, MAC, general) ? general   [name] 01/29/2021, 9:37 AM  _________________________________________________________________   (provider comments below)

## 2021-01-29 NOTE — Telephone Encounter (Signed)
   Primary Cardiologist: Quay Burow, MD  Chart reviewed as part of pre-operative protocol coverage. Given past medical history and time since last visit, based on ACC/AHA guidelines, [name] would be at acceptable risk for the planned procedure without further cardiovascular testing.   She has a RCRI class IV risk, 11% risk of major cardiac event.  She is able to complete greater than 4 METS of physical activity.  Patient was advised that if she develops new symptoms prior to surgery to contact our office to arrange a follow-up appointment.  He verbalized understanding.  I will route this recommendation to the requesting party via Epic fax function and remove from pre-op pool.  Please call with questions.  [name] [name] NP-C    01/29/2021, 9:59 AM [address] [place] [address] Office [phone] Fax [phone]

## 2021-02-05 DIAGNOSIS — G4700 Insomnia, unspecified: Secondary | ICD-10-CM | POA: Diagnosis not present

## 2021-02-05 DIAGNOSIS — I10 Essential (primary) hypertension: Secondary | ICD-10-CM | POA: Diagnosis not present

## 2021-02-05 DIAGNOSIS — M8580 Other specified disorders of bone density and structure, unspecified site: Secondary | ICD-10-CM | POA: Diagnosis not present

## 2021-02-05 DIAGNOSIS — N183 Chronic kidney disease, stage 3 unspecified: Secondary | ICD-10-CM | POA: Diagnosis not present

## 2021-02-05 DIAGNOSIS — F329 Major depressive disorder, single episode, unspecified: Secondary | ICD-10-CM | POA: Diagnosis not present

## 2021-02-05 DIAGNOSIS — S51811A Laceration without foreign body of right forearm, initial encounter: Secondary | ICD-10-CM | POA: Diagnosis not present

## 2021-02-05 DIAGNOSIS — K219 Gastro-esophageal reflux disease without esophagitis: Secondary | ICD-10-CM | POA: Diagnosis not present

## 2021-02-05 DIAGNOSIS — M19041 Primary osteoarthritis, right hand: Secondary | ICD-10-CM | POA: Diagnosis not present

## 2021-02-05 DIAGNOSIS — I5043 Acute on chronic combined systolic (congestive) and diastolic (congestive) heart failure: Secondary | ICD-10-CM | POA: Diagnosis not present

## 2021-02-05 DIAGNOSIS — E7800 Pure hypercholesterolemia, unspecified: Secondary | ICD-10-CM | POA: Diagnosis not present

## 2021-02-18 DIAGNOSIS — I2782 Chronic pulmonary embolism: Secondary | ICD-10-CM | POA: Diagnosis not present

## 2021-02-18 DIAGNOSIS — Z7982 Long term (current) use of aspirin: Secondary | ICD-10-CM | POA: Diagnosis not present

## 2021-02-18 DIAGNOSIS — M19031 Primary osteoarthritis, right wrist: Secondary | ICD-10-CM | POA: Diagnosis not present

## 2021-02-18 DIAGNOSIS — M47812 Spondylosis without myelopathy or radiculopathy, cervical region: Secondary | ICD-10-CM | POA: Diagnosis not present

## 2021-02-18 DIAGNOSIS — M19011 Primary osteoarthritis, right shoulder: Secondary | ICD-10-CM | POA: Diagnosis not present

## 2021-02-18 DIAGNOSIS — M19049 Primary osteoarthritis, unspecified hand: Secondary | ICD-10-CM | POA: Diagnosis not present

## 2021-02-18 DIAGNOSIS — M19032 Primary osteoarthritis, left wrist: Secondary | ICD-10-CM | POA: Diagnosis not present

## 2021-02-20 DIAGNOSIS — Z4789 Encounter for other orthopedic aftercare: Secondary | ICD-10-CM | POA: Diagnosis not present

## 2021-02-20 DIAGNOSIS — S51811A Laceration without foreign body of right forearm, initial encounter: Secondary | ICD-10-CM | POA: Diagnosis not present

## 2021-02-20 DIAGNOSIS — Z471 Aftercare following joint replacement surgery: Secondary | ICD-10-CM | POA: Diagnosis not present

## 2021-02-22 DIAGNOSIS — I5043 Acute on chronic combined systolic (congestive) and diastolic (congestive) heart failure: Secondary | ICD-10-CM | POA: Diagnosis not present

## 2021-02-25 NOTE — Progress Notes (Addendum)
        Anesthesia Review:  PCP: DR Shirline Frees  Cardiologist : DR Lorie Phenix  Clearance in Telephone encounter dated 01/29/21 by  Coletta Memos, NP  [place] 08/02/20 by Annamaria Boots  Chest x-ray : EKG : 11/02/2020  Echo : Stress test: Cardiac Cath : 07/12/2019  Activity level:  Sleep Study/ CPAP : Fasting Blood Sugar :      / Checks Blood Sugar -- times a day:   Blood Thinner/ Instructions /Last Dose: ASA / Instructions/ Last Dose :   81 mg Aspirin

## 2021-02-25 NOTE — Progress Notes (Signed)
Your procedure is scheduled on:      03/07/2021   Report to [facility] Main  Entrance   Report to admitting at  [place]  AM     Call this number if you have problems the morning of surgery [phone]    REMEMBER: NO  SOLID FOOD CANDY OR GUM AFTER MIDNIGHT. CLEAR LIQUIDS UNTIL    0700am        . NOTHING BY MOUTH EXCEPT CLEAR LIQUIDS UNTIL  0700am   . PLEASE FINISH ENSURE DRINK PER SURGEON ORDER  WHICH NEEDS TO BE COMPLETED AT   0700am    .      CLEAR LIQUID DIET   Foods Allowed                                                                    Coffee and tea, regular and decaf                            Fruit ices (not with fruit pulp)                                      Iced Popsicles                                    Carbonated beverages, regular and diet                                    Cranberry, grape and apple juices Sports drinks like Gatorade Lightly seasoned clear broth or consume(fat free) Sugar, honey syrup ___________________________________________________________________      BRUSH YOUR TEETH MORNING OF SURGERY AND RINSE YOUR MOUTH OUT, NO CHEWING GUM CANDY OR MINTS.     Take these medicines the morning of surgery with A SIP OF WATER:   Allopurinol, eye drops as usual, Toprol, Omeprazole   DO NOT TAKE ANY DIABETIC MEDICATIONS DAY OF YOUR SURGERY                               You may not have any metal on your body including hair pins and              piercings  Do not wear jewelry, make-up, lotions, powders or perfumes, deodorant             Do not wear nail polish on your fingernails.  Do not shave  48 hours prior to surgery.              Men may shave face and neck.   Do not bring valuables to the hospital. [place].  Contacts, dentures or bridgework may not be worn into surgery.  Leave suitcase in the car. After surgery it may be brought to your room.  Patients discharged  the day of surgery will not be allowed to drive home. IF YOU ARE HAVING SURGERY AND GOING HOME THE SAME DAY, YOU MUST HAVE AN ADULT TO DRIVE YOU HOME AND BE WITH YOU FOR 24 HOURS. YOU MAY GO HOME BY TAXI OR UBER OR ORTHERWISE, BUT AN ADULT MUST ACCOMPANY YOU HOME AND STAY WITH YOU FOR 24 HOURS.  Name and phone number of your driver:  Special Instructions: N/A              Please read over the following fact sheets you were given: _____________________________________________________________________  [facility] - Preparing for Surgery Before surgery, you can play an important role.  Because skin is not sterile, your skin needs to be as free of germs as possible.  You can reduce the number of germs on your skin by washing with CHG (chlorahexidine gluconate) soap before surgery.  CHG is an antiseptic cleaner which kills germs and bonds with the skin to continue killing germs even after washing. Please DO NOT use if you have an allergy to CHG or antibacterial soaps.  If your skin becomes reddened/irritated stop using the CHG and inform your nurse when you arrive at Short Stay. Do not shave (including legs and underarms) for at least 48 hours prior to the first CHG shower.  You may shave your face/neck. Please follow these instructions carefully:  1.  Shower with CHG Soap the night before surgery and the  morning of Surgery.  2.  If you choose to wash your hair, wash your hair first as usual with your  normal  shampoo.  3.  After you shampoo, rinse your hair and body thoroughly to remove the  shampoo.                           4.  Use CHG as you would any other liquid soap.  You can apply chg directly  to the skin and wash                       Gently with a scrungie or clean washcloth.  5.  Apply the CHG Soap to your body ONLY FROM THE NECK DOWN.   Do not use on face/ open                           Wound or open sores. Avoid contact with eyes, ears mouth and genitals (private parts).                        Wash face,  Genitals (private parts) with your normal soap.             6.  Wash thoroughly, paying special attention to the area where your surgery  will be performed.  7.  Thoroughly rinse your body with warm water from the neck down.  8.  DO NOT shower/wash with your normal soap after using and rinsing off  the CHG Soap.                9.  Pat yourself dry with a clean towel.            10.  Wear clean pajamas.            11.  Place clean sheets on your bed the night of your first shower and do not  sleep with  pets. Day of Surgery : Do not apply any lotions/deodorants the morning of surgery.  Please wear clean clothes to the hospital/surgery center.  FAILURE TO FOLLOW THESE INSTRUCTIONS MAY RESULT IN THE CANCELLATION OF YOUR SURGERY PATIENT SIGNATURE_________________________________  NURSE SIGNATURE__________________________________  ________________________________________________________________________              [facility]- Preparing for Total Shoulder Arthroplasty    Before surgery, you can play an important role. Because skin is not sterile, your skin needs to be as free of germs as possible. You can reduce the number of germs on your skin by using the following products. Benzoyl Peroxide Gel Reduces the number of germs present on the skin Applied twice a day to shoulder area starting two days before surgery    ==================================================================  Please follow these instructions carefully:  BENZOYL PEROXIDE 5% GEL  Please do not use if you have an allergy to benzoyl peroxide.   If your skin becomes reddened/irritated stop using the benzoyl peroxide.  Starting two days before surgery, apply as follows: Apply benzoyl peroxide in the morning and at night. Apply after taking a shower. If you are not taking a shower clean entire shoulder front, back, and side along with the armpit with a clean wet washcloth.  Place a quarter-sized dollop  on your shoulder and rub in thoroughly, making sure to cover the front, back, and side of your shoulder, along with the armpit.   2 days before ____ AM   ____ PM              1 day before ____ AM   ____ PM                         Do this twice a day for two days.  (Last application is the night before surgery, AFTER using the CHG soap as described below).  Do NOT apply benzoyl peroxide gel on the day of surgery.

## 2021-02-26 ENCOUNTER — Encounter (HOSPITAL_COMMUNITY)
Admission: RE | Admit: 2021-02-26 | Discharge: 2021-02-26 | Disposition: A | Discharge status: Home / Self Care | Payer: PPO | Source: Ambulatory Visit | Attending: Orthopedic Surgery | Admitting: Orthopedic Surgery

## 2021-02-26 ENCOUNTER — Other Ambulatory Visit: Payer: Self-pay

## 2021-02-26 ENCOUNTER — Encounter (HOSPITAL_COMMUNITY): Payer: Self-pay

## 2021-02-26 DIAGNOSIS — Z01812 Encounter for preprocedural laboratory examination: Secondary | ICD-10-CM | POA: Insufficient documentation

## 2021-02-26 DIAGNOSIS — Z86718 Personal history of other venous thrombosis and embolism: Secondary | ICD-10-CM | POA: Diagnosis not present

## 2021-02-26 DIAGNOSIS — Z01818 Encounter for other preprocedural examination: Secondary | ICD-10-CM

## 2021-02-26 DIAGNOSIS — I130 Hypertensive heart and chronic kidney disease with heart failure and stage 1 through stage 4 chronic kidney disease, or unspecified chronic kidney disease: Secondary | ICD-10-CM | POA: Diagnosis not present

## 2021-02-26 DIAGNOSIS — K219 Gastro-esophageal reflux disease without esophagitis: Secondary | ICD-10-CM | POA: Diagnosis not present

## 2021-02-26 DIAGNOSIS — M75101 Unspecified rotator cuff tear or rupture of right shoulder, not specified as traumatic: Secondary | ICD-10-CM | POA: Insufficient documentation

## 2021-02-26 DIAGNOSIS — I5043 Acute on chronic combined systolic (congestive) and diastolic (congestive) heart failure: Secondary | ICD-10-CM | POA: Insufficient documentation

## 2021-02-26 DIAGNOSIS — Z7982 Long term (current) use of aspirin: Secondary | ICD-10-CM | POA: Diagnosis not present

## 2021-02-26 DIAGNOSIS — Z7901 Long term (current) use of anticoagulants: Secondary | ICD-10-CM | POA: Diagnosis not present

## 2021-02-26 DIAGNOSIS — Z79899 Other long term (current) drug therapy: Secondary | ICD-10-CM | POA: Diagnosis not present

## 2021-02-26 DIAGNOSIS — N189 Chronic kidney disease, unspecified: Secondary | ICD-10-CM | POA: Diagnosis not present

## 2021-02-26 LAB — BASIC METABOLIC PANEL
Anion gap: 6 (ref 5–15)
BUN: 35 mg/dL — ABNORMAL HIGH (ref 8–23)
CO2: 30 mmol/L (ref 22–32)
Calcium: 9.2 mg/dL (ref 8.9–10.3)
Chloride: 102 mmol/L (ref 98–111)
Creatinine, Ser: 1.02 mg/dL — ABNORMAL HIGH (ref 0.44–1.00)
GFR, Estimated: 56 mL/min — ABNORMAL LOW (ref >60)
Glucose, Bld: 89 mg/dL (ref 70–99)
Potassium: 3.9 mmol/L (ref 3.5–5.1)
Sodium: 138 mmol/L (ref 135–145)

## 2021-02-26 LAB — CBC
HCT: 33.2 % — ABNORMAL LOW (ref 36.0–46.0)
Hemoglobin: 10.4 g/dL — ABNORMAL LOW (ref 12.0–15.0)
MCH: 29.5 pg (ref 26.0–34.0)
MCHC: 31.3 g/dL (ref 30.0–36.0)
MCV: 94.3 fL (ref 80.0–100.0)
Platelets: 168 10*3/uL (ref 150–400)
RBC: 3.52 MIL/uL — ABNORMAL LOW (ref 3.87–5.11)
RDW: 15.2 % (ref 11.5–15.5)
WBC: 4.3 10*3/uL (ref 4.0–10.5)
nRBC: 0 % (ref 0.0–0.2)

## 2021-02-26 LAB — SURGICAL PCR SCREEN
MRSA, PCR: NEGATIVE
Staphylococcus aureus: NEGATIVE

## 2021-02-26 NOTE — Progress Notes (Signed)
        Anesthesia Review:  PCP: DR Shirline Frees  Cardiologist : DR Lorie Phenix  Clearance in Telephone encounter dated 01/29/21 by  Coletta Memos, NP  [place] 08/02/20 by Annamaria Boots  Chest x-ray : EKG : 11/02/2020  Echo : Stress test: Cardiac Cath : 07/12/2019  Activity level:  Sleep Study/ CPAP : Fasting Blood Sugar :      / Checks Blood Sugar -- times a day:   Blood Thinner/ Instructions /Last Dose: ASA / Instructions/ Last Dose :   81 mg Aspirin   COVID Vaccine: Pfizer x 2. 05/29/20 Hx: HTN,CAD,MI

## 2021-02-27 NOTE — Progress Notes (Signed)
Anesthesia Chart Review   Case: [id number] Date/Time: 03/07/21 0945   Procedure: REVERSE SHOULDER ARTHROPLASTY (Right: Shoulder) - 188min   Anesthesia type: General   Pre-op diagnosis: Right shoulder rotator cuff tear arthropathy   Location: [facility] 06 / [facility]   Surgeons: Justice Britain, MD       DISCUSSION:80 y.o. never smoker with h/o HTN, GERD, CKD Stage III, CHF, PE, DVT, right shoulder rotator cuff tear scheduled for above procedure 03/07/2021 with Dr. Justice Britain.   H/o prothrombin II gene mutation, hemachromatosis, DVT/PE. Followed by hematology.  Last seen 01/23/2021. Pt takes 2 baby asa daily, advised to hold 5 days prior to shoulder surgery.   Per cardiology preoperative evaluation 01/29/2021, "Chart reviewed as part of pre-operative protocol coverage. Given past medical history and time since last visit, based on ACC/AHA guidelines, [name] would be at acceptable risk for the planned procedure without further cardiovascular testing.    She has a RCRI class IV risk, 11% risk of major cardiac event.  She is able to complete greater than 4 METS of physical activity."  Anticipate pt can proceed with planned procedure barring acute status change.   VS: BP (!) 130/59   Pulse 61   Temp 36.7 C (Oral)   Resp 18   Ht 5\' 3"  (1.6 m)   Wt 80.4 kg   SpO2 97%   BMI 31.40 kg/m   PROVIDERS: Shirline Frees, MD is PCP   Quay Burow, MD is Cardiologist  LABS: Labs reviewed: Acceptable for surgery. (all labs ordered are listed, but only abnormal results are displayed)  Labs Reviewed  BASIC METABOLIC PANEL - Abnormal; Notable for the following components:      Result Value   BUN 35 (*)    Creatinine, Ser 1.02 (*)    GFR, Estimated 56 (*)    All other components within normal limits  CBC - Abnormal; Notable for the following components:   RBC 3.52 (*)    Hemoglobin 10.4 (*)    HCT 33.2 (*)    All other components within normal limits  SURGICAL PCR SCREEN      IMAGES:   EKG: 11/02/2020 Rate 72 bpm  Sinus rhythm with occasional Premature ventricular complexes Left bundle branch block Abnormal ECG Since last tracing Premature ventricular complexes NOW PRESENT  CV: Cardiac Cath 07/12/2019 Prox LAD lesion is 45% stenosed.   Mild coronary obstructive disease with a smooth area of 40 to 50% proximal LAD stenosis in the region of a large first diagonal vessel.  The remainder of the coronary arteries are angiographically normal including a large ramus intermediate vessel, left circumflex vessel, and dominant RCA.   Mild right heart pressure elevation with PA systolic pressure at 37 and mean pressure 25 mmHg.   LVEDP: 25 mmHg   RECOMMENDATION: Medical therapy for mild CAD involving her proximal LAD with guideline directed medical therapy for LV dysfunction.  Stress Test 07/08/2019 The left ventricular ejection fraction is severely decreased (<30%). Nuclear stress EF: 29%. There was no ST segment deviation noted during stress. There is a small defect of mild severity present in the mid anteroseptal and apical septal location. The defect is non-reversible. This may be related to underlying LBBB. No ischemia noted. This is a high risk study due to LV dysfunction.  Echo 06/20/19  1. Mild global hypokinesis worse in the septum.. Left ventricular  ejection fraction, by estimation, is 45 to 50%. The left ventricle has  mildly decreased function. The left ventricle  demonstrates global  hypokinesis. The left ventricular internal cavity  size was mildly dilated. There is mild asymmetric left ventricular  hypertrophy. Left ventricular diastolic parameters are consistent with  Grade I diastolic dysfunction (impaired relaxation). Elevated left  ventricular end-diastolic pressure.   2. Right ventricular systolic function is normal. The right ventricular  size is normal. There is normal pulmonary artery systolic pressure.   3. Left atrial size was  moderately dilated.   4. The mitral valve is normal in structure and function. Mild mitral  valve regurgitation. No evidence of mitral stenosis.   5. The aortic valve is tricuspid. Aortic valve regurgitation is trivial.  No aortic stenosis is present.   6. The inferior vena cava is normal in size with greater than 50%  respiratory variability, suggesting right atrial pressure of 3 mmHg. Past Medical History:  Diagnosis Date   Anemia    Arthritis    osteoarthritis Shoulder,neck   Bell's palsy 1980s   CHF (congestive heart failure) ([place]) 06/2019   Chronic kidney disease    Stage III   Dyspnea 06/2019   On exertion   GERD (gastroesophageal reflux disease)    Hemochromatosis    History of hiatal hernia    Hypertension    Iron deficiency anemia due to chronic blood loss 03/10/2017   PE (pulmonary embolism) 2007   x2   Peripheral vascular disease ([facility])    DVT Rt leg   Pneumonia 1990s   "walking" pneumonia   Prothrombin gene mutation ([place]) 05/30/2013   Restless legs    Right leg DVT ([place]) 05/30/2013   Stroke ([place])    found on a MRI, she's not aware otherwise    Past Surgical History:  Procedure Laterality Date   BACK SURGERY     CARDIAC CATHETERIZATION Bilateral 2005   COLONOSCOPY     IVC FILTER INSERTION N/A 03/12/2020   Procedure: IVC FILTER INSERTION;  Surgeon: Waynetta Sandy, MD;  Location: [facility];  Service: Cardiovascular;  Laterality: N/A;   IVC FILTER REMOVAL N/A 06/11/2020   Procedure: IVC FILTER REMOVAL;  Surgeon: Waynetta Sandy, MD;  Location: [facility];  Service: Cardiovascular;  Laterality: N/A;   RIGHT/LEFT HEART CATH AND CORONARY ANGIOGRAPHY N/A 07/12/2019   Procedure: RIGHT/LEFT HEART CATH AND CORONARY ANGIOGRAPHY;  Surgeon: Troy Sine, MD;  Location: [facility];  Service: Cardiovascular;  Laterality: N/A;   SHOULDER SURGERY Bilateral    TOTAL KNEE ARTHROPLASTY Right 03/16/2020   Procedure: TOTAL KNEE  ARTHROPLASTY;  Surgeon: Susa Day, MD;  Location: [facility];  Service: Orthopedics;  Laterality: Right;  2.5 hrs    MEDICATIONS:  allopurinol (ZYLOPRIM) 300 MG tablet   aspirin EC 81 MG tablet   atorvastatin (LIPITOR) 40 MG tablet   Biotin 1000 MCG tablet   Calcium Carb-Cholecalciferol (CALCIUM 600+D3 PO)   cholecalciferol (VITAMIN D) 25 MCG (1000 UNIT) tablet   citalopram (CELEXA) 20 MG tablet   colchicine 0.6 MG tablet   cyclobenzaprine (FLEXERIL) 10 MG tablet   cycloSPORINE (RESTASIS) 0.05 % ophthalmic emulsion   docusate sodium (COLACE) 100 MG capsule   ferrous sulfate 325 (65 FE) MG EC tablet   furosemide (LASIX) 80 MG tablet   Hypromellose (ARTIFICIAL TEARS OP)   losartan (COZAAR) 50 MG tablet   metoprolol succinate (TOPROL-XL) 25 MG 24 hr tablet   Multiple Vitamin (MULTIVITAMIN WITH MINERALS) TABS tablet   Omega-3 Fatty Acids (FISH OIL PO)   omeprazole (PRILOSEC) 20 MG capsule   ondansetron (  ZOFRAN) 4 MG tablet   oxyCODONE-acetaminophen (PERCOCET/ROXICET) 5-325 MG tablet   polycarbophil (FIBERCON) 625 MG tablet   pramipexole (MIRAPEX) 0.5 MG tablet   temazepam (RESTORIL) 15 MG capsule   Turmeric Curcumin 500 MG CAPS   vitamin E 180 MG (400 UNITS) capsule   No current facility-administered medications for this encounter.      Konrad Felix ward, PA-C [facility] [phone]

## 2021-02-27 NOTE — Anesthesia Preprocedure Evaluation (Addendum)
Anesthesia Evaluation  Patient identified by MRN, date of birth, ID band Patient awake    Reviewed: Allergy & Precautions, H&P , NPO status , Patient's Chart, lab work & pertinent test results  Airway Mallampati: II  TM Distance: >3 FB Neck ROM: Full    Dental no notable dental hx. (+) Teeth Intact, Dental Advisory Given   Pulmonary neg pulmonary ROS,    Pulmonary exam normal breath sounds clear to auscultation       Cardiovascular hypertension, Pt. on medications and Pt. on home beta blockers + Peripheral Vascular Disease and +CHF   Rhythm:Regular Rate:Normal     Neuro/Psych CVA, No Residual Symptoms negative psych ROS   GI/Hepatic Neg liver ROS, hiatal hernia, GERD  Medicated,  Endo/Other  negative endocrine ROS  Renal/GU Renal InsufficiencyRenal disease  negative genitourinary   Musculoskeletal  (+) Arthritis , Osteoarthritis,    Abdominal   Peds  Hematology  (+) Blood dyscrasia, anemia ,   Anesthesia Other Findings   Reproductive/Obstetrics negative OB ROS                           Anesthesia Physical Anesthesia Plan  ASA: 3  Anesthesia Plan: General   Post-op Pain Management:  Regional for Post-op pain   Induction: Intravenous  PONV Risk Score and Plan: 4 or greater and Ondansetron, Dexamethasone and Treatment may vary due to age or medical condition  Airway Management Planned: Oral ETT  Additional Equipment:   Intra-op Plan:   Post-operative Plan: Extubation in OR  Informed Consent: I have reviewed the patients History and Physical, chart, labs and discussed the procedure including the risks, benefits and alternatives for the proposed anesthesia with the patient or authorized representative who has indicated his/her understanding and acceptance.     Dental advisory given  Plan Discussed with: CRNA  Anesthesia Plan Comments: (See PAT note 02/26/21, Konrad Felix  Ward, PA-C)       Anesthesia Quick Evaluation

## 2021-03-07 ENCOUNTER — Ambulatory Visit (HOSPITAL_COMMUNITY): Payer: PPO | Admitting: Anesthesiology

## 2021-03-07 ENCOUNTER — Ambulatory Visit (HOSPITAL_COMMUNITY): Payer: PPO | Admitting: Physician Assistant

## 2021-03-07 ENCOUNTER — Encounter (HOSPITAL_COMMUNITY)
Admission: RE | Disposition: A | Discharge status: Home / Self Care | Payer: Self-pay | Source: Ambulatory Visit | Attending: Orthopedic Surgery

## 2021-03-07 ENCOUNTER — Ambulatory Visit (HOSPITAL_COMMUNITY)
Admission: RE | Admit: 2021-03-07 | Discharge: 2021-03-07 | Disposition: A | Discharge status: Home / Self Care | Payer: PPO | Source: Ambulatory Visit | Attending: Orthopedic Surgery | Admitting: Orthopedic Surgery

## 2021-03-07 ENCOUNTER — Encounter (HOSPITAL_COMMUNITY): Payer: Self-pay | Admitting: Orthopedic Surgery

## 2021-03-07 DIAGNOSIS — I509 Heart failure, unspecified: Secondary | ICD-10-CM | POA: Diagnosis not present

## 2021-03-07 DIAGNOSIS — I130 Hypertensive heart and chronic kidney disease with heart failure and stage 1 through stage 4 chronic kidney disease, or unspecified chronic kidney disease: Secondary | ICD-10-CM | POA: Diagnosis not present

## 2021-03-07 DIAGNOSIS — M75101 Unspecified rotator cuff tear or rupture of right shoulder, not specified as traumatic: Secondary | ICD-10-CM | POA: Insufficient documentation

## 2021-03-07 DIAGNOSIS — Z79899 Other long term (current) drug therapy: Secondary | ICD-10-CM | POA: Insufficient documentation

## 2021-03-07 DIAGNOSIS — I739 Peripheral vascular disease, unspecified: Secondary | ICD-10-CM | POA: Insufficient documentation

## 2021-03-07 DIAGNOSIS — Z8673 Personal history of transient ischemic attack (TIA), and cerebral infarction without residual deficits: Secondary | ICD-10-CM | POA: Diagnosis not present

## 2021-03-07 DIAGNOSIS — G8918 Other acute postprocedural pain: Secondary | ICD-10-CM | POA: Diagnosis not present

## 2021-03-07 DIAGNOSIS — Z01818 Encounter for other preprocedural examination: Secondary | ICD-10-CM

## 2021-03-07 DIAGNOSIS — K219 Gastro-esophageal reflux disease without esophagitis: Secondary | ICD-10-CM | POA: Diagnosis not present

## 2021-03-07 DIAGNOSIS — D649 Anemia, unspecified: Secondary | ICD-10-CM | POA: Diagnosis not present

## 2021-03-07 DIAGNOSIS — I5043 Acute on chronic combined systolic (congestive) and diastolic (congestive) heart failure: Secondary | ICD-10-CM

## 2021-03-07 DIAGNOSIS — N183 Chronic kidney disease, stage 3 unspecified: Secondary | ICD-10-CM | POA: Diagnosis not present

## 2021-03-07 DIAGNOSIS — E876 Hypokalemia: Secondary | ICD-10-CM | POA: Diagnosis not present

## 2021-03-07 DIAGNOSIS — M19011 Primary osteoarthritis, right shoulder: Secondary | ICD-10-CM | POA: Insufficient documentation

## 2021-03-07 DIAGNOSIS — M12811 Other specific arthropathies, not elsewhere classified, right shoulder: Secondary | ICD-10-CM | POA: Diagnosis not present

## 2021-03-07 HISTORY — PX: REVERSE SHOULDER ARTHROPLASTY: SHX5054

## 2021-03-07 SURGERY — ARTHROPLASTY, SHOULDER, TOTAL, REVERSE
Anesthesia: General | Site: Shoulder | Laterality: Right

## 2021-03-07 MED ORDER — FENTANYL CITRATE PF 50 MCG/ML IJ SOSY
50.0000 ug | PREFILLED_SYRINGE | INTRAMUSCULAR | Status: DC
  Administered 2021-03-07: 50 ug via INTRAVENOUS

## 2021-03-07 MED ORDER — ACETAMINOPHEN 500 MG PO TABS
1000.0000 mg | ORAL_TABLET | Freq: Once | ORAL | Status: AC
  Administered 2021-03-07: 1000 mg via ORAL

## 2021-03-07 MED ORDER — FENTANYL CITRATE (PF) 100 MCG/2ML IJ SOLN
INTRAMUSCULAR | Status: AC
  Filled 2021-03-07: qty 2

## 2021-03-07 MED ORDER — METOCLOPRAMIDE HCL 5 MG PO TABS
5.0000 mg | ORAL_TABLET | Freq: Three times a day (TID) | ORAL | Status: DC | PRN
  Filled 2021-03-07: qty 2

## 2021-03-07 MED ORDER — LACTATED RINGERS IV SOLN: INTRAVENOUS | Status: DC

## 2021-03-07 MED ORDER — BUPIVACAINE LIPOSOME 1.3 % IJ SUSP
INTRAMUSCULAR | Status: DC | PRN
  Administered 2021-03-07: 10 mL via PERINEURAL

## 2021-03-07 MED ORDER — VANCOMYCIN HCL 1000 MG IV SOLR
INTRAVENOUS | Status: DC | PRN
  Administered 2021-03-07: 1000 mg via TOPICAL

## 2021-03-07 MED ORDER — PROPOFOL 10 MG/ML IV BOLUS
INTRAVENOUS | Status: DC | PRN
  Administered 2021-03-07: 140 mg via INTRAVENOUS

## 2021-03-07 MED ORDER — ACETAMINOPHEN 500 MG PO TABS
ORAL_TABLET | ORAL | Status: AC
  Filled 2021-03-07: qty 2

## 2021-03-07 MED ORDER — ORAL CARE MOUTH RINSE: 15.0000 mL | Freq: Once | OROMUCOSAL | Status: AC

## 2021-03-07 MED ORDER — ONDANSETRON HCL 4 MG/2ML IJ SOLN: 4.0000 mg | Freq: Four times a day (QID) | INTRAMUSCULAR | Status: DC | PRN

## 2021-03-07 MED ORDER — LACTATED RINGERS IV BOLUS
500.0000 mL | Freq: Once | INTRAVENOUS | Status: AC
  Administered 2021-03-07: 500 mL via INTRAVENOUS

## 2021-03-07 MED ORDER — SUGAMMADEX SODIUM 200 MG/2ML IV SOLN
INTRAVENOUS | Status: DC | PRN
  Administered 2021-03-07: 200 mg via INTRAVENOUS

## 2021-03-07 MED ORDER — ONDANSETRON HCL 4 MG PO TABS
4.0000 mg | ORAL_TABLET | Freq: Four times a day (QID) | ORAL | Status: DC | PRN
  Filled 2021-03-07: qty 1

## 2021-03-07 MED ORDER — STERILE WATER FOR IRRIGATION IR SOLN
Status: DC | PRN
  Administered 2021-03-07: 2000 mL

## 2021-03-07 MED ORDER — 0.9 % SODIUM CHLORIDE (POUR BTL) OPTIME
TOPICAL | Status: DC | PRN
  Administered 2021-03-07: 1000 mL

## 2021-03-07 MED ORDER — ONDANSETRON HCL 4 MG/2ML IJ SOLN
INTRAMUSCULAR | Status: DC | PRN
  Administered 2021-03-07: 4 mg via INTRAVENOUS

## 2021-03-07 MED ORDER — PHENYLEPHRINE 40 MCG/ML (10ML) SYRINGE FOR IV PUSH (FOR BLOOD PRESSURE SUPPORT)
PREFILLED_SYRINGE | INTRAVENOUS | Status: DC | PRN
  Administered 2021-03-07: 120 ug via INTRAVENOUS
  Administered 2021-03-07: 80 ug via INTRAVENOUS
  Administered 2021-03-07: 120 ug via INTRAVENOUS

## 2021-03-07 MED ORDER — METOCLOPRAMIDE HCL 5 MG/ML IJ SOLN
5.0000 mg | Freq: Three times a day (TID) | INTRAMUSCULAR | Status: DC | PRN
Start: 2021-03-07 — End: 2021-03-07

## 2021-03-07 MED ORDER — ROCURONIUM BROMIDE 10 MG/ML (PF) SYRINGE
PREFILLED_SYRINGE | INTRAVENOUS | Status: AC
  Filled 2021-03-07: qty 10

## 2021-03-07 MED ORDER — LACTATED RINGERS IV BOLUS
250.0000 mL | Freq: Once | INTRAVENOUS | Status: AC
  Administered 2021-03-07: 250 mL via INTRAVENOUS

## 2021-03-07 MED ORDER — TRANEXAMIC ACID-NACL 1000-0.7 MG/100ML-% IV SOLN
1000.0000 mg | INTRAVENOUS | Status: AC
  Administered 2021-03-07: 1000 mg via INTRAVENOUS
  Filled 2021-03-07: qty 100

## 2021-03-07 MED ORDER — PHENYLEPHRINE HCL-NACL 20-0.9 MG/250ML-% IV SOLN
INTRAVENOUS | Status: AC
  Filled 2021-03-07: qty 750

## 2021-03-07 MED ORDER — CEFAZOLIN SODIUM-DEXTROSE 2-4 GM/100ML-% IV SOLN
2.0000 g | INTRAVENOUS | Status: AC
  Administered 2021-03-07: 2 g via INTRAVENOUS
  Filled 2021-03-07: qty 100

## 2021-03-07 MED ORDER — BUPIVACAINE-EPINEPHRINE (PF) 0.5% -1:200000 IJ SOLN
INTRAMUSCULAR | Status: DC | PRN
  Administered 2021-03-07: 15 mL via PERINEURAL

## 2021-03-07 MED ORDER — PROPOFOL 10 MG/ML IV BOLUS
INTRAVENOUS | Status: AC
  Filled 2021-03-07: qty 20

## 2021-03-07 MED ORDER — LIDOCAINE HCL (PF) 2 % IJ SOLN
INTRAMUSCULAR | Status: AC
  Filled 2021-03-07: qty 5

## 2021-03-07 MED ORDER — PHENYLEPHRINE HCL-NACL 20-0.9 MG/250ML-% IV SOLN
INTRAVENOUS | Status: DC | PRN
  Administered 2021-03-07: 40 ug/min via INTRAVENOUS

## 2021-03-07 MED ORDER — FENTANYL CITRATE PF 50 MCG/ML IJ SOSY
PREFILLED_SYRINGE | INTRAMUSCULAR | Status: AC
  Filled 2021-03-07: qty 2

## 2021-03-07 MED ORDER — ROCURONIUM BROMIDE 10 MG/ML (PF) SYRINGE
PREFILLED_SYRINGE | INTRAVENOUS | Status: DC | PRN
  Administered 2021-03-07: 70 mg via INTRAVENOUS

## 2021-03-07 MED ORDER — DEXAMETHASONE SODIUM PHOSPHATE 10 MG/ML IJ SOLN
INTRAMUSCULAR | Status: DC | PRN
  Administered 2021-03-07: 4 mg via INTRAVENOUS

## 2021-03-07 MED ORDER — CHLORHEXIDINE GLUCONATE 0.12 % MT SOLN
15.0000 mL | Freq: Once | OROMUCOSAL | Status: AC
  Administered 2021-03-07: 15 mL via OROMUCOSAL

## 2021-03-07 MED ORDER — LIDOCAINE HCL (PF) 2 % IJ SOLN
INTRAMUSCULAR | Status: DC | PRN
  Administered 2021-03-07: 100 mg via INTRADERMAL

## 2021-03-07 SURGICAL SUPPLY — 66 items
BAG COUNTER SPONGE SURGICOUNT (BAG) ×2 IMPLANT
BAG SURGICOUNT SPONGE COUNTING (BAG) ×1
BAG ZIPLOCK 12X15 (MISCELLANEOUS) ×3 IMPLANT
BLADE SAW SGTL 83.5X18.5 (BLADE) ×3 IMPLANT
BNDG COHESIVE 4X5 TAN ST LF (GAUZE/BANDAGES/DRESSINGS) ×3 IMPLANT
COOLER ICEMAN CLASSIC (MISCELLANEOUS) ×3 IMPLANT
COVER BACK TABLE 60X90IN (DRAPES) ×3 IMPLANT
COVER SURGICAL LIGHT HANDLE (MISCELLANEOUS) ×3 IMPLANT
CUP SUT UNIV REVERS 36 NEUTRAL (Cup) ×3 IMPLANT
DERMABOND ADVANCED (GAUZE/BANDAGES/DRESSINGS) ×2
DERMABOND ADVANCED .7 DNX12 (GAUZE/BANDAGES/DRESSINGS) ×1 IMPLANT
DRAPE INCISE IOBAN 66X45 STRL (DRAPES) IMPLANT
DRAPE ORTHO SPLIT 77X108 STRL (DRAPES) ×6
DRAPE SHEET LG 3/4 BI-LAMINATE (DRAPES) ×3 IMPLANT
DRAPE SURG 17X11 SM STRL (DRAPES) ×3 IMPLANT
DRAPE SURG ORHT 6 SPLT 77X108 (DRAPES) ×2 IMPLANT
DRAPE TOP 10253 STERILE (DRAPES) ×3 IMPLANT
DRAPE U-SHAPE 47X51 STRL (DRAPES) ×3 IMPLANT
DRESSING AQUACEL AG SP 3.5X6 (GAUZE/BANDAGES/DRESSINGS) ×1 IMPLANT
DRSG AQUACEL AG ADV 3.5X10 (GAUZE/BANDAGES/DRESSINGS) IMPLANT
DRSG AQUACEL AG SP 3.5X6 (GAUZE/BANDAGES/DRESSINGS) ×3
DURAPREP 26ML APPLICATOR (WOUND CARE) ×3 IMPLANT
ELECT BLADE TIP CTD 4 INCH (ELECTRODE) ×3 IMPLANT
ELECT REM PT RETURN 15FT ADLT (MISCELLANEOUS) ×3 IMPLANT
FACESHIELD WRAPAROUND (MASK) ×15 IMPLANT
GLENOID UNI REV MOD 24 +2 LAT (Joint) ×3 IMPLANT
GLENOSPHERE 36 +4 LAT/24 (Joint) ×3 IMPLANT
GLOVE SRG 8 PF TXTR STRL LF DI (GLOVE) ×1 IMPLANT
GLOVE SURG ENC MOIS LTX SZ7 (GLOVE) ×3 IMPLANT
GLOVE SURG ENC MOIS LTX SZ7.5 (GLOVE) ×3 IMPLANT
GLOVE SURG UNDER POLY LF SZ7 (GLOVE) ×3 IMPLANT
GLOVE SURG UNDER POLY LF SZ8 (GLOVE) ×3
GOWN STRL REUS W/TWL LRG LVL3 (GOWN DISPOSABLE) ×6 IMPLANT
INSERT HUMERAL 36 +6 (Shoulder) ×3 IMPLANT
KIT BASIN OR (CUSTOM PROCEDURE TRAY) ×3 IMPLANT
KIT TURNOVER KIT A (KITS) IMPLANT
MANIFOLD NEPTUNE II (INSTRUMENTS) ×3 IMPLANT
NEEDLE TAPERED W/ NITINOL LOOP (MISCELLANEOUS) ×3 IMPLANT
NS IRRIG 1000ML POUR BTL (IV SOLUTION) ×3 IMPLANT
PACK SHOULDER (CUSTOM PROCEDURE TRAY) ×3 IMPLANT
PAD ARMBOARD 7.5X6 YLW CONV (MISCELLANEOUS) ×3 IMPLANT
PAD COLD SHLDR WRAP-ON (PAD) ×3 IMPLANT
PIN NITINOL TARGETER 2.8 (PIN) IMPLANT
PIN SET MODULAR GLENOID SYSTEM (PIN) ×3 IMPLANT
RESTRAINT HEAD UNIVERSAL NS (MISCELLANEOUS) ×3 IMPLANT
SCREW CENTRAL MODULAR 25 (Screw) ×3 IMPLANT
SCREW PERI LOCK 5.5X16 (Screw) ×6 IMPLANT
SCREW PERI LOCK 5.5X32 (Screw) ×6 IMPLANT
SLING ARM FOAM STRAP LRG (SOFTGOODS) IMPLANT
SLING ARM FOAM STRAP MED (SOFTGOODS) ×3 IMPLANT
SPONGE T-LAP 18X18 ~~LOC~~+RFID (SPONGE) ×3 IMPLANT
SPONGE T-LAP 4X18 ~~LOC~~+RFID (SPONGE) ×3 IMPLANT
STEM HUMERAL UNI REVERS SZ6 (Stem) ×3 IMPLANT
SUCTION FRAZIER HANDLE 12FR (TUBING) ×3
SUCTION TUBE FRAZIER 12FR DISP (TUBING) ×1 IMPLANT
SUT FIBERWIRE #2 38 T-5 BLUE (SUTURE)
SUT MNCRL AB 3-0 PS2 18 (SUTURE) ×3 IMPLANT
SUT MON AB 2-0 CT1 36 (SUTURE) ×3 IMPLANT
SUT VIC AB 1 CT1 36 (SUTURE) ×6 IMPLANT
SUTURE FIBERWR #2 38 T-5 BLUE (SUTURE) IMPLANT
SUTURE TAPE 1.3 40 TPR END (SUTURE) ×2 IMPLANT
SUTURETAPE 1.3 40 TPR END (SUTURE) ×6
TOWEL OR 17X26 10 PK STRL BLUE (TOWEL DISPOSABLE) ×3 IMPLANT
TOWEL OR NON WOVEN STRL DISP B (DISPOSABLE) ×3 IMPLANT
WATER STERILE IRR 1000ML POUR (IV SOLUTION) ×6 IMPLANT
YANKAUER SUCT BULB TIP 10FT TU (MISCELLANEOUS) ×3 IMPLANT

## 2021-03-07 NOTE — Op Note (Signed)
03/07/2021  11:40 AM  PATIENT:   [name]  80 y.o. female  PRE-OPERATIVE DIAGNOSIS:  Right shoulder rotator cuff tear arthropathy  POST-OPERATIVE DIAGNOSIS: Same  PROCEDURE: Right shoulder reverse arthroplasty utilizing a press-fit size 6 Arthrex stem with a neutral metaphysis, +6 polyethylene insert, 36/+4 glenosphere on a small/+2 baseplate  SURGEON:  Lavonia Eager, Metta Clines M.D.  ASSISTANTS: Jenetta Loges, PA-C  ANESTHESIA:   General endotracheal and interscalene block with Exparel  EBL: 200 cc  SPECIMEN: None  Drains: None   PATIENT DISPOSITION:  PACU - hemodynamically stable.    PLAN OF CARE: Discharge to home after PACU  Brief history:  Patient is an 80 year old female who has had chronic and progressively increasing right shoulder pain related to severe rotator cuff tear arthropathy.  She has remote history of an open rotator cuff repair.  Her symptoms have now deteriorated to the point that she is having significant functional mutations.  She is brought to the operating room this time for planned right shoulder reverse arthroplasty.  Preoperatively, I counseled the patient regarding treatment options and risks versus benefits thereof.  Possible surgical complications were all reviewed including potential for bleeding, infection, neurovascular injury, persistent pain, loss of motion, anesthetic complication, failure of the implant, and possible need for additional surgery. They understand and accept and agrees with our planned procedure.   Procedure in detail:  After undergoing routine preop evaluation the patient received prophylactic antibiotics and interscalene block with Exparel was established in the holding area by the anesthesia department.  Patient was subsequently placed supine on the operating table and underwent the smooth induction of general endotracheal anesthesia.  Placed into the beachchair position and appropriately padded and protected.  The right  shoulder region was sterilely prepped and draped in standard fashion.  Timeout was called.  A deltopectoral approach to the right shoulder was made through an approximately 8 cm incision.  Skin flaps were elevated dissection carried deeply and the deltopectoral interval was developed from proximal to distal with the vein taken laterally.  Adhesions were divided beneath the deltoid and the conjoined tendon was mobilized and retracted medially.  Of note there was severe deformity with anterior superior escape of the humeral head which did cause significant change in the underlying anatomy making the exposure more challenging.  We divided the capsular attachments from the anterior and infra margins of the humeral neck.  There had been complete avulsion of the subscapularis and prior rupture long of biceps tendon and the humeral head was completely denuded of any rotator cuff attachments.  Once the humeral head was delivered through the wound an oscillating saw was used to perform our humeral head resection at approximate 20 degrees of retroversion.  Marginal osteophytes were removed and we did suture ligate an arterial branch along the anterior inferior aspect of the axillary pouch tissues.  A metal cap was then placed over the cut proximal humeral surface and the glenoid was then exposed with appropriate retractors.  Severe synovitis and capsular hypertrophy was encountered.  A circumferential labral resection and capsulectomy was performed.  A guidepin was then directed into the center of the glenoid with an approximately 10 degree inferior tilt and the glenoid was then reamed with our central followed by the peripheral reamers.  Glenoid preparation completed with the central drill and tapped with a 25 mm lag screw.  Our baseplate was then assembled with vancomycin powder applied to the threads of the lag screw and the baseplate was then inserted  with excellent purchase and fixation.  The peripheral locking screws  were all then placed using standard technique with excellent fixation.  A 36/+4 glenosphere was then impacted onto the baseplate and the central locking screw was placed.  We returned our attention back to the proximal humerus where the canal was opened and we broached up to a size 6 with excellent fit.  20 degrees retroversion maintained.  Metaphysis was then prepared with the reamer and a neutral alignment guide.  A trial was placed and reduction showed good motion stability and soft tissue balance.  At this point our trial was then removed.  Our final implant was assembled on the back table.  The canal was cleaned and dried and vancomycin powder spread liberally into the humeral canal.  Our final implant was then seated with excellent fixation.  Trial reductions at this point showed the best soft tissue balance with a +6 probably which gave [place] good motion good stability.  Our trial was removed.  The final +6 probably was then impacted.  Final reduction showed again good motion good stability and soft tissue balance.  At this point the wound was copiously irrigated.  Final hemostasis was obtained.  Vancomycin powder was then spread liberally throughout the deep soft tissue planes.  The deltopectoral interval was reapproximated with a series of figure-of-eight number Vicryl sutures.  2-0 Monocryl used to the subcu layer and intracuticular 3-0 Monocryl for the skin followed by Dermabond and Aquacel dressing.  The right arm was placed into a sling.  The patient was awakened, extubated, and taken to the recovery room in stable condition.  Jenetta Loges, PA-C was utilized as an Environmental consultant throughout this case, essential for help with positioning the patient, positioning extremity, tissue manipulation, implantation of the prosthesis, suture management, wound closure, and intraoperative decision-making.  Marin Shutter MD   Contact # [phone]

## 2021-03-07 NOTE — Anesthesia Procedure Notes (Signed)
Procedure Name: Intubation Date/Time: 03/07/2021 10:13 AM Performed by: Milford Cage, CRNA Pre-anesthesia Checklist: Patient identified, Emergency Drugs available, Suction available and Patient being monitored Patient Re-evaluated:Patient Re-evaluated prior to induction Oxygen Delivery Method: Circle system utilized Preoxygenation: Pre-oxygenation with 100% oxygen Induction Type: IV induction Ventilation: Mask ventilation without difficulty Laryngoscope Size: Mac and 3 Grade View: Grade I Tube type: Oral Tube size: 7.0 mm Number of attempts: 1 Airway Equipment and Method: Stylet Placement Confirmation: ETT inserted through vocal cords under direct vision, positive ETCO2 and breath sounds checked- equal and bilateral Secured at: 21 cm Tube secured with: Tape Dental Injury: Teeth and Oropharynx as per pre-operative assessment

## 2021-03-07 NOTE — Transfer of Care (Signed)
Immediate Anesthesia Transfer of Care Note  Patient: [name]  Procedure(s) Performed: REVERSE SHOULDER ARTHROPLASTY (Right: Shoulder)  Patient Location: PACU  Anesthesia Type:General  Level of Consciousness: awake and patient cooperative  Airway & Oxygen Therapy: Patient Spontanous Breathing and Patient connected to face mask  Post-op Assessment: Report given to RN and Post -op Vital signs reviewed and stable  Post vital signs: Reviewed and stable  Last Vitals:  Vitals Value Taken Time  BP    Temp    Pulse    Resp    SpO2      Last Pain:  Vitals:   03/07/21 0742  TempSrc: Oral  PainSc: 0-No pain      Patients Stated Pain Goal: 3 (15/40/08 6761)  Complications: No notable events documented.

## 2021-03-07 NOTE — Discharge Instructions (Signed)
 Kevin M. Supple, M.D., F.A.A.O.S. Orthopaedic Surgery Specializing in Arthroscopic and Reconstructive Surgery of the Shoulder [phone] [address] - [place], [place] [address] - Fax [phone]   POST-OP TOTAL SHOULDER REPLACEMENT INSTRUCTIONS  1. Follow up in the office for your first post-op appointment 10-14 days from the date of your surgery. If you do not already have a scheduled appointment, our office will contact you to schedule.  2. The bandage over your incision is waterproof. You may begin showering with this dressing on. You may leave this dressing on until first follow up appointment within 2 weeks. We prefer you leave this dressing in place until follow up however after 5-7 days if you are having itching or skin irritation and would like to remove it you may do so. Go slow and tug at the borders gently to break the bond the dressing has with the skin. At this point if there is no drainage it is okay to go without a bandage or you may cover it with a light guaze and tape. You can also expect significant bruising around your shoulder that will drift down your arm and into your chest wall. This is very normal and should resolve over several days.   3. Wear your sling/immobilizer at all times except to perform the exercises below or to occasionally let your arm dangle by your side to stretch your elbow. You also need to sleep in your sling immobilizer until instructed otherwise. It is ok to remove your sling if you are sitting in a controlled environment and allow your arm to rest in a position of comfort by your side or on your lap with pillows to give your neck and skin a break from the sling. You may remove it to allow arm to dangle by side to shower. If you are up walking around and when you go to sleep at night you need to wear it.  4. Range of motion to your elbow, wrist, and hand are encouraged 3-5 times daily. Exercise to your hand and fingers helps to reduce  swelling you may experience.   5. Prescriptions for a pain medication and a muscle relaxant are provided for you. It is recommended that if you are experiencing pain that you pain medication alone is not controlling, add the muscle relaxant along with the pain medication which can give additional pain relief. The first 1-2 days is generally the most severe of your pain and then should gradually decrease. As your pain lessens it is recommended that you decrease your use of the pain medications to an "as needed basis'" only and to always comply with the recommended dosages of the pain medications.  6. Pain medications can produce constipation along with their use. If you experience this, the use of an over the counter stool softener or laxative daily is recommended.   7. For additional questions or concerns, please do not hesitate to call the office. If after hours there is an answering service to forward your concerns to the physician on call.  8.Pain control following an exparel block  To help control your post-operative pain you received a nerve block  performed with Exparel which is a long acting anesthetic (numbing agent) which can provide pain relief and sensations of numbness (and relief of pain) in the operative shoulder and arm for up to 3 days. Sometimes it provides mixed relief, meaning you may still have numbness in certain areas of the arm but can still be able to   move  parts of that arm, hand, and fingers. We recommend that your prescribed pain medications  be used as needed. We do not feel it is necessary to "pre medicate" and "stay ahead" of pain.  Taking narcotic pain medications when you are not having any pain can lead to unnecessary and potentially dangerous side effects.    9. Use the ice machine as much as possible in the first 5-7 days from surgery, then you can wean its use to as needed. The ice typically needs to be replaced every 6 hours, instead of ice you can actually freeze  water bottles to put in the cooler and then fill water around them to avoid having to purchase ice. You can have spare water bottles freezing to allow you to rotate them once they have melted. Try to have a thin shirt or light cloth or towel under the ice wrap to protect your skin.   FOR ADDITIONAL INFO ON ICE MACHINE AND INSTRUCTIONS GO TO THE WEBSITE AT  https://www.[employer].com/products/donjoy/[email or web address]-iceman-[email or web address]  10.  We recommend that you avoid any dental work or cleaning in the first 3 months following your joint replacement. This is to help minimize the possibility of infection from the bacteria in your mouth that enters your bloodstream during dental work. We also recommend that you take an antibiotic prior to your dental work for the first year after your shoulder replacement to further help reduce that risk. Please simply contact our office for antibiotics to be sent to your pharmacy prior to dental work.  11. Dental Antibiotics:  In most cases prophylactic antibiotics for Dental procdeures after total joint surgery are not necessary.  Exceptions are as follows:  1. History of prior total joint infection  2. Severely immunocompromised (Organ Transplant, cancer chemotherapy, Rheumatoid biologic meds such as Humera)  3. Poorly controlled diabetes (A1C &gt; 8.0, blood glucose over 200)  If you have one of these conditions, contact your surgeon for an antibiotic prescription, prior to your dental procedure.   POST-OP EXERCISES  Pendulum Exercises  Perform pendulum exercises while standing and bending at the waist. Support your uninvolved arm on a table or chair and allow your operated arm to hang freely. Make sure to do these exercises passively - not using you shoulder muscles. These exercises can be performed once your nerve block effects have worn off.  Repeat 20 times. Do 3 sessions per day.     

## 2021-03-07 NOTE — Progress Notes (Signed)
Assisted Dr. Roderic Palau with Right Interscalene Brachial Plexus block. Side rails up, monitors on throughout procedure. See vital signs in flow sheet. Tolerated Procedure well.

## 2021-03-07 NOTE — H&P (Signed)
[name]    Chief Complaint: Right shoulder rotator cuff tear arthropathy HPI: The patient is a 80 y.o. female with chronic and progressively increasing right shoulder pain related to severe rotator cuff tear arthropathy.  Due to her increasing functional rotations and failure to respond to prolonged attempts at conservative management, she is brought to the operating room at this time for planned right shoulder reverse arthroplasty.  Past Medical History:  Diagnosis Date   Anemia    Arthritis    osteoarthritis Shoulder,neck   Bell's palsy 1980s   CHF (congestive heart failure) ([place]) 06/2019   Chronic kidney disease    Stage III   Dyspnea 06/2019   On exertion   GERD (gastroesophageal reflux disease)    Hemochromatosis    History of hiatal hernia    Hypertension    Iron deficiency anemia due to chronic blood loss 03/10/2017   PE (pulmonary embolism) 2007   x2   Peripheral vascular disease (H[facility])    DVT Rt leg   Pneumonia 1990s   "walking" pneumonia   Prothrombin gene mutation ([place]) 05/30/2013   Restless legs    Right leg DVT ([place]) 05/30/2013   Stroke ([place])    found on a MRI, she's not aware otherwise    Past Surgical History:  Procedure Laterality Date   BACK SURGERY     CARDIAC CATHETERIZATION Bilateral 2005   COLONOSCOPY     IVC FILTER INSERTION N/A 03/12/2020   Procedure: IVC FILTER INSERTION;  Surgeon: Waynetta Sandy, MD;  Location: [facility];  Service: Cardiovascular;  Laterality: N/A;   IVC FILTER REMOVAL N/A 06/11/2020   Procedure: IVC FILTER REMOVAL;  Surgeon: Waynetta Sandy, MD;  Location: [facility];  Service: Cardiovascular;  Laterality: N/A;   RIGHT/LEFT HEART CATH AND CORONARY ANGIOGRAPHY N/A 07/12/2019   Procedure: RIGHT/LEFT HEART CATH AND CORONARY ANGIOGRAPHY;  Surgeon: Troy Sine, MD;  Location: [facility];  Service: Cardiovascular;  Laterality: N/A;   SHOULDER SURGERY Bilateral    TOTAL KNEE  ARTHROPLASTY Right 03/16/2020   Procedure: TOTAL KNEE ARTHROPLASTY;  Surgeon: Susa Day, MD;  Location: [facility];  Service: Orthopedics;  Laterality: Right;  2.5 hrs    Family History  Problem Relation Age of Onset   Congestive Heart Failure Mother    Pancreatic cancer Father     Social History:  reports that she has never smoked. She has never used smokeless tobacco. She reports that she does not drink alcohol and does not use drugs.   Medications Prior to Admission  Medication Sig Dispense Refill   allopurinol (ZYLOPRIM) 300 MG tablet Take 300 mg by mouth in the morning.     aspirin EC 81 MG tablet Take 162 mg by mouth in the morning. Swallow whole.     atorvastatin (LIPITOR) 40 MG tablet Take 1 tablet (40 mg total) by mouth daily at 6 PM. 30 tablet 2   Biotin 1000 MCG tablet Take 1,000 mcg by mouth in the morning.     Calcium Carb-Cholecalciferol (CALCIUM 600+D3 PO) Take 1 tablet by mouth in the morning and at bedtime.     cholecalciferol (VITAMIN D) 25 MCG (1000 UNIT) tablet Take 1,000 Units by mouth in the morning and at bedtime.     citalopram (CELEXA) 20 MG tablet Take 20 mg by mouth at bedtime.      colchicine 0.6 MG tablet Take 0.6 mg by mouth daily as needed (gout flares).  cycloSPORINE (RESTASIS) 0.05 % ophthalmic emulsion Place 1 drop into both eyes 2 (two) times daily.     ferrous sulfate 325 (65 FE) MG EC tablet Take 325 mg by mouth in the morning.     furosemide (LASIX) 80 MG tablet Take 1 tablet (80 mg total) by mouth daily. 30 tablet 11   Hypromellose (ARTIFICIAL TEARS OP) Place 1 drop into both eyes 3 (three) times daily as needed (dry eyes).     losartan (COZAAR) 50 MG tablet Take 1 tablet (50 mg total) by mouth daily. 30 tablet 2   metoprolol succinate (TOPROL-XL) 25 MG 24 hr tablet TAKE ONE TABLET BY MOUTH ONCE DAILY 90 tablet 3   Multiple Vitamin (MULTIVITAMIN WITH MINERALS) TABS tablet Take 1 tablet by mouth in the morning. Centrum Silver (NO IRON)      Omega-3 Fatty Acids (FISH OIL PO) Take 1 capsule by mouth in the morning.     omeprazole (PRILOSEC) 20 MG capsule Take 20 mg by mouth daily before breakfast.      oxyCODONE-acetaminophen (PERCOCET/ROXICET) 5-325 MG tablet Take 1 tablet by mouth daily as needed (pain.).     polycarbophil (FIBERCON) 625 MG tablet Take 625 mg by mouth in the morning and at bedtime.     pramipexole (MIRAPEX) 0.5 MG tablet Take 1 mg by mouth every evening.     temazepam (RESTORIL) 15 MG capsule Take 1 capsule (15 mg total) by mouth at bedtime.     Turmeric Curcumin 500 MG CAPS Take 500 mg by mouth at bedtime.      vitamin E 180 MG (400 UNITS) capsule Take 400 Units by mouth at bedtime.     cyclobenzaprine (FLEXERIL) 10 MG tablet Take 10 mg by mouth 3 (three) times daily as needed for muscle spasms.     docusate sodium (COLACE) 100 MG capsule Take 1 capsule (100 mg total) by mouth 2 (two) times daily. (Patient not taking: No sig reported) 60 capsule 2   ondansetron (ZOFRAN) 4 MG tablet Take by mouth.       Physical Exam: Right shoulder demonstrates painful and guarded motion is noted at her recent office visits.  Strength is globally decreased.  Well-healed superolateral incision.  Radiographs  Previous plain films confirm severe arthritis with changes consistent with chronic rotator cuff tear arthropathy.  Vitals  Temp:  [97.6 F (36.4 C)] 97.6 F (36.4 C) (11/10 0742) Pulse Rate:  [52-65] 62 (11/10 0832) Resp:  [14-23] 17 (11/10 0832) BP: (128-147)/(52-69) 143/69 (11/10 0832) SpO2:  [93 %-100 %] 96 % (11/10 0832) Weight:  [80.4 kg] 80.4 kg (11/10 0742)  Assessment/Plan  Impression: Right shoulder rotator cuff tear arthropathy  Plan of Action: Procedure(s): REVERSE SHOULDER ARTHROPLASTY  Rupal Childress M Shailey Butterbaugh 03/07/2021, 9:14 AM Contact # [phone]

## 2021-03-07 NOTE — Anesthesia Postprocedure Evaluation (Signed)
Anesthesia Post Note  Patient: [name]  Procedure(s) Performed: REVERSE SHOULDER ARTHROPLASTY (Right: Shoulder)     Patient location during evaluation: PACU Anesthesia Type: General and Regional Level of consciousness: awake and alert Pain management: pain level controlled Vital Signs Assessment: post-procedure vital signs reviewed and stable Respiratory status: spontaneous breathing, nonlabored ventilation and respiratory function stable Cardiovascular status: blood pressure returned to baseline and stable Postop Assessment: no apparent nausea or vomiting Anesthetic complications: no   No notable events documented.  Last Vitals:  Vitals:   03/07/21 1230 03/07/21 1316  BP: (!) 146/61 (!) 151/63  Pulse: (!) 52 (!) 58  Resp: 18 16  Temp:  (!) 36.4 C  SpO2: 93% 95%    Last Pain:  Vitals:   03/07/21 1316  TempSrc: Oral  PainSc: 3                  Kelsen Celona,W. EDMOND

## 2021-03-07 NOTE — Anesthesia Procedure Notes (Signed)
Anesthesia Regional Block: Interscalene brachial plexus block   Pre-Anesthetic Checklist: , timeout performed,  Correct Patient, Correct Site, Correct Laterality,  Correct Procedure, Correct Position, site marked,  Risks and benefits discussed,  Pre-op evaluation,  At surgeon's request and post-op pain management  Laterality: Right  Prep: Maximum Sterile Barrier Precautions used, chloraprep       Needles:  Injection technique: Single-shot  Needle Type: Echogenic Stimulator Needle     Needle Length: 5cm  Needle Gauge: 22     Additional Needles:   Procedures:,,,, ultrasound used (permanent image in chart),,    Narrative:  Start time: 03/07/2021 8:10 AM End time: 03/07/2021 8:20 AM Injection made incrementally with aspirations every 5 mL.  Performed by: Personally  Anesthesiologist: Roderic Palau, MD  Additional Notes: 2% Lidocaine skin wheel.

## 2021-03-07 NOTE — Evaluation (Signed)
Occupational Therapy Evaluation Patient Details Name: [name] MRN: [id number] DOB: 1940-05-16 Today's Date: 03/07/2021   History of Present Illness 80 y.o. female s/p R Reverse shoulder arthroplasty. PMH signficant for arthritis, CHF, HTN, PVD, bilateral shoulder surgery, and stroke (2015).   Clinical Impression   PTA, pt reports she was independent in IADLs and ADLs with the use of a rollator. Pt s/p shoulder replacement without functional use of right dominant upper extremity secondary to effects of surgery and interscalene block and shoulder precautions. Therapist provided education and instruction to patient and spouse in regards to exercises, precautions, positioning, donning upper extremity clothing and bathing while maintaining shoulder precautions, ice and edema management and donning/doffing sling. Patient and spouse verbalized understanding and demonstrated as needed. Patient needed assistance to donn shirt, pants, socks and shoes and provided with instruction on compensatory strategies to perform ADLs. Pt with balance deficits, history of falls at baseline, and unable to use RUE in compensation for deficits with rollator due to NWB status. Pt educated extensively on home safety, safe toilet/shower transfers, and recommended pt use quad cane rather than rollator post d/c. Pt and spouse verbalized understanding, but reports pt would feel more comfortable using rollator with LUE only. PA made aware. Patient to follow up with MD for further therapy needs.       Recommendations for follow up therapy are one component of a multi-disciplinary discharge planning process, led by the attending physician.  Recommendations may be updated based on patient status, additional functional criteria and insurance authorization.   Follow Up Recommendations  Follow physician's recommendations for discharge plan and follow up therapies    Assistance Recommended at Discharge Frequent or constant  Supervision/Assistance  Functional Status Assessment  Patient has had a recent decline in their functional status and demonstrates the ability to make significant improvements in function in a reasonable and predictable amount of time.  Equipment Recommendations  None recommended by OT    Recommendations for Other Services       Precautions / Restrictions Precautions Precautions: Fall Restrictions Weight Bearing Restrictions: Yes RUE Weight Bearing: Non weight bearing      Mobility Bed Mobility                    Transfers Overall transfer level: Needs assistance   Transfers: Sit to/from Stand Sit to Stand: Min guard           General transfer comment: no LOB, unsteady on feet and with foot drop on LLE.      Balance Overall balance assessment: History of Falls                                         ADL either performed or assessed with clinical judgement   ADL                                               Vision Patient Visual Report: No change from baseline       Perception     Praxis      Pertinent Vitals/Pain Pain Assessment: No/denies pain     Hand Dominance Right   Extremity/Trunk Assessment Upper Extremity Assessment Upper Extremity Assessment: RUE deficits/detail RUE Deficits / Details: residual effects of nerve block  Communication Communication Communication: No difficulties   Cognition Arousal/Alertness: Awake/alert Behavior During Therapy: WFL for tasks assessed/performed Overall Cognitive Status: Within Functional Limits for tasks assessed                                       General Comments       Exercises Exercises: Shoulder   Shoulder Instructions Shoulder Instructions Donning/doffing shirt without moving shoulder: Caregiver independent with task;Patient able to independently direct caregiver Method for sponge bathing under operated UE: Caregiver  independent with task;Patient able to independently direct caregiver Donning/doffing sling/immobilizer: Caregiver independent with task;Patient able to independently direct caregiver Correct positioning of sling/immobilizer: Caregiver independent with task;Patient able to independently direct caregiver Pendulum exercises (written home exercise program): Caregiver independent with task;Patient able to independently direct caregiver ROM for elbow, wrist and digits of operated UE: Supervision/safety Sling wearing schedule (on at all times/off for ADL's): Supervision/safety Proper positioning of operated UE when showering: Supervision/safety Positioning of UE while sleeping: Patient able to independently direct caregiver;Caregiver independent with task    Home Living Family/patient expects to be discharged to:: Private residence Living Arrangements: Spouse/significant other;Children Available Help at Discharge: Family;Available 24 hours/day Type of Home: House Home Access: Stairs to enter [employer] of Steps: 2 Entrance Stairs-Rails: Right Home Layout: One level     Bathroom Shower/Tub: Occupational psychologist: Handicapped height     Home Equipment: Rollator (4 wheels);Rolling Walker (2 wheels);BSC/3in1;Cane - quad   Additional Comments: pt with history of falls      Prior Functioning/Environment Prior Level of Function : Independent/Modified Independent                        OT Problem List: Impaired UE functional use;Decreased knowledge of use of DME or AE;Decreased safety awareness;Decreased knowledge of precautions;Decreased strength      OT Treatment/Interventions:      OT Goals(Current goals can be found in the care plan section) Acute Rehab OT Goals Patient Stated Goal: to go home OT Goal Formulation: With patient Time For Goal Achievement: 03/21/21 Potential to Achieve Goals: Good  OT Frequency:     Barriers to D/C:             Co-evaluation              AM-PAC OT "6 Clicks" Daily Activity     Outcome Measure Help from another person eating meals?: A Little Help from another person taking care of personal grooming?: A Little Help from another person toileting, which includes using toliet, bedpan, or urinal?: A Lot Help from another person bathing (including washing, rinsing, drying)?: A Lot Help from another person to put on and taking off regular upper body clothing?: A Lot Help from another person to put on and taking off regular lower body clothing?: A Little 6 Click Score: 15   End of Session Equipment Utilized During Treatment: Gait belt;Other (comment);Rollator (4 wheels) (quad cane)  Activity Tolerance:   Patient left:    OT Visit Diagnosis: Unsteadiness on feet (R26.81);History of falling (Z91.81)                Time: 8338-2505 OT Time Calculation (min): 39 min Charges:  OT General Charges $OT Visit: 1 Visit OT Evaluation $OT Eval Low Complexity: 1 Low OT Treatments $Self Care/Home Management : 23-37 mins  Jackquline Denmark, OTS Acute Rehab Office: [phone]  Shawna Kiener 03/07/2021, 2:49 PM

## 2021-03-13 ENCOUNTER — Encounter (HOSPITAL_COMMUNITY): Payer: Self-pay | Admitting: Orthopedic Surgery

## 2021-03-18 DIAGNOSIS — Z96611 Presence of right artificial shoulder joint: Secondary | ICD-10-CM | POA: Diagnosis not present

## 2021-03-18 DIAGNOSIS — Z471 Aftercare following joint replacement surgery: Secondary | ICD-10-CM | POA: Diagnosis not present

## 2021-03-26 DIAGNOSIS — I5043 Acute on chronic combined systolic (congestive) and diastolic (congestive) heart failure: Secondary | ICD-10-CM | POA: Diagnosis not present

## 2021-04-01 ENCOUNTER — Ambulatory Visit: Payer: PPO | Attending: Orthopedic Surgery

## 2021-04-01 ENCOUNTER — Other Ambulatory Visit: Payer: Self-pay

## 2021-04-01 DIAGNOSIS — M25511 Pain in right shoulder: Secondary | ICD-10-CM | POA: Insufficient documentation

## 2021-04-01 DIAGNOSIS — M25611 Stiffness of right shoulder, not elsewhere classified: Secondary | ICD-10-CM | POA: Diagnosis not present

## 2021-04-01 NOTE — Therapy (Signed)
[facility] [place], [place], [address] Phone: [phone]   Fax:  [phone]  Physical Therapy Evaluation  Patient Details  Name: ZAIRE VANBUSKIRK MRN: [id number] Date of Birth: [birth year] Referring Provider (PT): Supple   Encounter Date: 04/01/2021   PT End of Session - 04/01/21 1032     Visit Number 1    Number of Visits 12    Date for PT Re-Evaluation 06/21/21    Authorization Type FOTO    PT Start Time 1033    PT Stop Time 1130    PT Time Calculation (min) 57 min    Activity Tolerance Patient tolerated treatment well    Behavior During Therapy [facility] for tasks assessed/performed             Past Medical History:  Diagnosis Date   Anemia    Arthritis    osteoarthritis Shoulder,neck   Bell's palsy 1980s   CHF (congestive heart failure) ([place]) 06/2019   Chronic kidney disease    Stage III   Dyspnea 06/2019   On exertion   GERD (gastroesophageal reflux disease)    Hemochromatosis    History of hiatal hernia    Hypertension    Iron deficiency anemia due to chronic blood loss 03/10/2017   PE (pulmonary embolism) 2007   x2   Peripheral vascular disease ([place])    DVT Rt leg   Pneumonia 1990s   "walking" pneumonia   Prothrombin gene mutation ([place]) 05/30/2013   Restless legs    Right leg DVT ([place]) 05/30/2013   Stroke ([place])    found on a MRI, she's not aware otherwise    Past Surgical History:  Procedure Laterality Date   BACK SURGERY     CARDIAC CATHETERIZATION Bilateral 2005   COLONOSCOPY     IVC FILTER INSERTION N/A 03/12/2020   Procedure: IVC FILTER INSERTION;  Surgeon: Waynetta Sandy, MD;  Location: [facility];  Service: Cardiovascular;  Laterality: N/A;   IVC FILTER REMOVAL N/A 06/11/2020   Procedure: IVC FILTER REMOVAL;  Surgeon: Waynetta Sandy, MD;  Location: [facility];  Service: Cardiovascular;  Laterality: N/A;   REVERSE SHOULDER ARTHROPLASTY Right 03/07/2021    Procedure: REVERSE SHOULDER ARTHROPLASTY;  Surgeon: Justice Britain, MD;  Location: [facility];  Service: Orthopedics;  Laterality: Right;  190min   RIGHT/LEFT HEART CATH AND CORONARY ANGIOGRAPHY N/A 07/12/2019   Procedure: RIGHT/LEFT HEART CATH AND CORONARY ANGIOGRAPHY;  Surgeon: Troy Sine, MD;  Location: [place] [facility];  Service: Cardiovascular;  Laterality: N/A;   SHOULDER SURGERY Bilateral    TOTAL KNEE ARTHROPLASTY Right 03/16/2020   Procedure: TOTAL KNEE ARTHROPLASTY;  Surgeon: Susa Day, MD;  Location: [facility];  Service: Orthopedics;  Laterality: Right;  2.5 hrs    There were no vitals filed for this visit.    Subjective Assessment - 04/01/21 1034     Subjective Patient reports that her shoulder feels alright today. However, she notes that her arm hurts really bad last night. She notes that she had a right reverse total shoulder arthroplasty on 03/07/21. She notes that she had surgery on this shoulder previously, but she was unable to get full motion back. She reports that she has been trying to avoid using her pain medication because it makes her want to sleep the next day.    Pertinent History history of back surgery (July 2018), CHF, HTN, history of prior right shoulder surgery    Limitations House hold activities  Patient Stated Goals be able to use her right arm,    Currently in Pain? Yes    Pain Score --   "terrible pain"   Pain Location Shoulder    Pain Orientation Right    Pain Descriptors / Indicators Other (Comment)   "terrible pain"   Pain Type Surgical pain    Pain Radiating Towards along right biceps    Pain Onset 1 to 4 weeks ago    Pain Frequency Intermittent    Aggravating Factors  using her right arm,    Pain Relieving Factors medication    Effect of Pain on Daily Activities unable to use her right arm                [facility] PT Assessment - 04/01/21 0001       Assessment   Medical Diagnosis Right RTSA on 03/07/21    Referring Provider (PT)  Supple    Onset Date/Surgical Date 03/07/21    Hand Dominance Right    Next MD Visit 04/24/21    Prior Therapy No      Precautions   Precautions Shoulder    Type of Shoulder Precautions RTSA    Shoulder Interventions For comfort      Restrictions   Weight Bearing Restrictions No      Balance Screen   Has the patient fallen in the past 6 months Yes    How many times? 1    Has the patient had a decrease in activity level because of a fear of falling?  No    Is the patient reluctant to leave their home because of a fear of falling?  No      Home Ecologist residence    Living Arrangements Spouse/significant other    Type of [place] to enter    Entrance Stairs-Number of Steps 3    Entrance Stairs-Rails Right      Prior Function   Level of Independence Independent    Vocation Retired      Associate Professor   Overall Cognitive Status Within Functional Limits for tasks assessed    Attention Focused    Focused Attention Appears intact    Memory Appears intact    Awareness Appears intact    Problem Solving Appears intact      Observation/Other Assessments   Observations Minimal redness around incision    Focus on Therapeutic Outcomes (FOTO)  25.4      Sensation   Additional Comments Patient reports no numbness or tingling in her right shoulder or arm      ROM / Strength   AROM / PROM / Strength AROM;PROM      AROM   AROM Assessment Site Shoulder    Right/Left Shoulder Left;Right    Right Shoulder Flexion 77 Degrees   unable to isolate shoulder flexion   Right Shoulder ABduction 89 Degrees    Left Shoulder Flexion 124 Degrees    Left Shoulder ABduction 130 Degrees    Left Shoulder Internal Rotation --   T8   Left Shoulder External Rotation --   T1     PROM   PROM Assessment Site Shoulder    Right/Left Shoulder Right    Right Shoulder Flexion 90 Degrees    Right Shoulder ABduction 100 Degrees    Right Shoulder  External Rotation 5 Degrees  Objective measurements completed on examination: See above findings.       [facility] Adult PT Treatment/Exercise - 04/01/21 0001       Modalities   Modalities Vasopneumatic      Vasopneumatic   Number Minutes Vasopneumatic  15 minutes    Vasopnuematic Location  Shoulder    Vasopneumatic Pressure Low    Vasopneumatic Temperature  34                          PT Long Term Goals - 04/01/21 1307       PT LONG TERM GOAL #1   Title Patient will be independent with her HEP.    Time 6    Period Weeks    Status New    Target Date 05/13/21      PT LONG TERM GOAL #2   Title Patient will be able to demonstrate at least 110 degrees of active right shoulder flexion for improved function shopping for groceries.    Time 6    Period Weeks    Status New    Target Date 05/13/21      PT LONG TERM GOAL #3   Title Patient will be able to demonstrate at least 110 degrees of active left shoulder abduction for improved function reaching for her dishes.    Time 6    Period Weeks    Status New    Target Date 05/13/21      PT LONG TERM GOAL #4   Title Patient will report no more than 4/10 pain in her right shoulder with eating and dressing.    Time 6    Period Weeks    Status New    Target Date 05/13/21                    Plan - 04/01/21 1252     Clinical Impression Statement Patient is a 80 year old female presenting to physical therapy following a right reverse total shoulder on 03/07/21. She presented to high right shoulder pain and irritability which limited her active and passive range of motion. Recommend that she continue with skilled physical therapy to address her remaining impairments to maximize her functional mobility.    Personal Factors and Comorbidities Comorbidity 1;Past/Current Experience;Comorbidity 2;Comorbidity 3+    Comorbidities history of back surgery (July 2018), CHF, HTN,  history of prior right shoulder surgery    Examination-Activity Limitations Bathing;Reach Overhead;Self Feeding;Carry;Sleep;Dressing;Hygiene/Grooming    Examination-Participation Restrictions Other    Stability/Clinical Decision Making Evolving/Moderate complexity    Clinical Decision Making Moderate    Rehab Potential Good    PT Frequency 2x / week    PT Duration 6 weeks    PT Treatment/Interventions ADLs/Self Care Home Management;Electrical Stimulation;Ultrasound;Neuromuscular re-education;Therapeutic exercise;Therapeutic activities;Patient/family education;Manual techniques;Passive range of motion;Vasopneumatic Device    PT Next Visit Plan pendulums, nustep, PROM, vasopneumatic as needed    Consulted and Agree with Plan of Care Patient             Patient will benefit from skilled therapeutic intervention in order to improve the following deficits and impairments:  Decreased range of motion, Impaired UE functional use, Decreased activity tolerance, Pain, Decreased strength  Visit Diagnosis: Stiffness of right shoulder, not elsewhere classified  Acute pain of right shoulder     Problem List Patient Active Problem List   Diagnosis Date Noted   S/P TKR (total knee replacement) using cement 03/16/2020   Hyperlipidemia 01/31/2020  AKI (acute kidney injury) ([place]) 09/15/2019   Right flank pain 09/15/2019   Anemia 09/15/2019   Abnormal nuclear cardiac imaging test    CHF (congestive heart failure) ([place]) 07/09/2019   Dyspnea 07/09/2019   Respiratory failure with hypoxia ([place]) 07/09/2019   Shoulder pain 07/09/2019   Dyspnea on exertion 06/03/2019   Bilateral lower extremity edema 06/03/2019   Left bundle branch block 06/03/2019   Iron deficiency anemia due to chronic blood loss 03/10/2017   Postoperative wound dehiscence 12/02/2016   E. coli UTI    Abnormal urinalysis    Medication side effect    Neuropathic pain    History of DVT (deep vein thrombosis)    HTN  (hypertension)    RLS (restless legs syndrome)    Urinary retention    Hypokalemia    Hypoalbuminemia due to protein-calorie malnutrition (HCC)    Acute blood loss anemia    Lumbar stenosis with neurogenic claudication 11/18/2016   Spondylolisthesis of lumbar region 11/12/2016   Hereditary hemochromatosis ([place]) 07/28/2016   Prothrombin gene mutation ([place]) 05/30/2013   Right leg DVT ([place]) 05/30/2013   Hemochromatosis 07/29/2012    Darlin Coco, PT 04/01/2021, 4:07 PM  [facility] [address] [place], [place], [address] Phone: [phone]   Fax:  [phone]  Name: SONG MYRE MRN: [id number] Date of Birth: 11-13-1940

## 2021-04-04 ENCOUNTER — Ambulatory Visit: Payer: PPO

## 2021-04-04 ENCOUNTER — Other Ambulatory Visit: Payer: Self-pay

## 2021-04-04 DIAGNOSIS — M25611 Stiffness of right shoulder, not elsewhere classified: Secondary | ICD-10-CM

## 2021-04-04 DIAGNOSIS — M25511 Pain in right shoulder: Secondary | ICD-10-CM

## 2021-04-04 NOTE — Therapy (Signed)
[facility] [place], [place], [address] Phone: [phone]   Fax:  [phone]  Physical Therapy Treatment  Patient Details  Name: [name] MRN: [id number] Date of Birth: 1940/12/31 Referring Provider (PT): Supple   Encounter Date: 04/04/2021   PT End of Session - 04/04/21 1309     Visit Number 2    Number of Visits 12    Date for PT Re-Evaluation 06/21/21    Authorization Type FOTO    PT Start Time 1300    PT Stop Time 1347    PT Time Calculation (min) 47 min    Activity Tolerance Patient tolerated treatment well    Behavior During Therapy [facility] for tasks assessed/performed             Past Medical History:  Diagnosis Date   Anemia    Arthritis    osteoarthritis Shoulder,neck   Bell's palsy 1980s   CHF (congestive heart failure) ([place]) 06/2019   Chronic kidney disease    Stage III   Dyspnea 06/2019   On exertion   GERD (gastroesophageal reflux disease)    Hemochromatosis    History of hiatal hernia    Hypertension    Iron deficiency anemia due to chronic blood loss 03/10/2017   PE (pulmonary embolism) 2007   x2   Peripheral vascular disease ([place])    DVT Rt leg   Pneumonia 1990s   "walking" pneumonia   Prothrombin gene mutation ([place]) 05/30/2013   Restless legs    Right leg DVT ([place]) 05/30/2013   Stroke ([place])    found on a MRI, she's not aware otherwise    Past Surgical History:  Procedure Laterality Date   BACK SURGERY     CARDIAC CATHETERIZATION Bilateral 2005   COLONOSCOPY     IVC FILTER INSERTION N/A 03/12/2020   Procedure: IVC FILTER INSERTION;  Surgeon: Waynetta Sandy, MD;  Location: [facility];  Service: Cardiovascular;  Laterality: N/A;   IVC FILTER REMOVAL N/A 06/11/2020   Procedure: IVC FILTER REMOVAL;  Surgeon: Waynetta Sandy, MD;  Location: [facility];  Service: Cardiovascular;  Laterality: N/A;   REVERSE SHOULDER ARTHROPLASTY Right 03/07/2021    Procedure: REVERSE SHOULDER ARTHROPLASTY;  Surgeon: Justice Britain, MD;  [facility];  Service: Orthopedics;  Laterality: Right;  160min   RIGHT/LEFT HEART CATH AND CORONARY ANGIOGRAPHY N/A 07/12/2019   Procedure: RIGHT/LEFT HEART CATH AND CORONARY ANGIOGRAPHY;  Surgeon: Troy Sine, MD;  Location: [facility];  Service: Cardiovascular;  Laterality: N/A;   SHOULDER SURGERY Bilateral    TOTAL KNEE ARTHROPLASTY Right 03/16/2020   Procedure: TOTAL KNEE ARTHROPLASTY;  Surgeon: Susa Day, MD;  [facility];  Service: Orthopedics;  Laterality: Right;  2.5 hrs    There were no vitals filed for this visit.   Subjective Assessment - 04/04/21 1305     Subjective Pt arrives for today's treatment session reporting no pain at rest, but 7/10 right shoulder pain with movement.    Pertinent History history of back surgery (July 2018), CHF, HTN, history of prior right shoulder surgery    Limitations House hold activities    Patient Stated Goals be able to use her right arm,    Currently in Pain? Yes    Pain Score 7     Pain Location Shoulder    Pain Orientation Right    Pain Onset 1 to 4 weeks ago  [facility] Adult PT Treatment/Exercise - 04/04/21 0001       Exercises   Exercises Shoulder      Shoulder Exercises: ROM/Strengthening   Nustep Lvl 2 x 5 mins   unable to tolerate due to pain     Modalities   Modalities Electrical Stimulation;Vasopneumatic      Electrical Stimulation   Electrical Stimulation Location Right shoulder    Electrical Stimulation Action IFC at 80-150 Hz    Electrical Stimulation Parameters 40% scan x 15 mins    Electrical Stimulation Goals Pain;Tone      Vasopneumatic   Number Minutes Vasopneumatic  15 minutes    Vasopnuematic Location  Shoulder    Vasopneumatic Pressure Low    Vasopneumatic Temperature  34      Manual Therapy   Manual Therapy Soft tissue mobilization;Passive ROM    Soft  tissue mobilization STM/W to right deltoid and bicep to decrease pain and tone    Passive ROM Gentle PROM in flex, ER, and IR with gentle hold at end range                          PT Long Term Goals - 04/01/21 1307       PT LONG TERM GOAL #1   Title Patient will be independent with her HEP.    Time 6    Period Weeks    Status New    Target Date 05/13/21      PT LONG TERM GOAL #2   Title Patient will be able to demonstrate at least 110 degrees of active right shoulder flexion for improved function shopping for groceries.    Time 6    Period Weeks    Status New    Target Date 05/13/21      PT LONG TERM GOAL #3   Title Patient will be able to demonstrate at least 110 degrees of active left shoulder abduction for improved function reaching for her dishes.    Time 6    Period Weeks    Status New    Target Date 05/13/21      PT LONG TERM GOAL #4   Title Patient will report no more than 4/10 pain in her right shoulder with eating and dressing.    Time 6    Period Weeks    Status New    Target Date 05/13/21                   Plan - 04/04/21 1309     Clinical Impression Statement Pt arrives for today's treatment denying any pain while at rest, but reports 7/10 right shoulder pain with movement.  Pt only able to tolerate 5 mins of Nustep for warmup due to increased pain.  PROM performed into flexion, ER, and IR with gentle hold at end ranges.  STW/M performed to right deltoid and bicep to decrease pain and tone.  Normal responses noted to estim and vaso at completion of treatment.  Pt reported 3/10 right shoulder pain at completion of today's treatment session.    Personal Factors and Comorbidities Comorbidity 1;Past/Current Experience;Comorbidity 2;Comorbidity 3+    Comorbidities history of back surgery (July 2018), CHF, HTN, history of prior right shoulder surgery    Examination-Activity Limitations Bathing;Reach Overhead;Self  Feeding;Carry;Sleep;Dressing;Hygiene/Grooming    Examination-Participation Restrictions Other    Stability/Clinical Decision Making Evolving/Moderate complexity    Rehab Potential Good    PT Frequency 2x / week  PT Duration 6 weeks    PT Treatment/Interventions ADLs/Self Care Home Management;Electrical Stimulation;Ultrasound;Neuromuscular re-education;Therapeutic exercise;Therapeutic activities;Patient/family education;Manual techniques;Passive range of motion;Vasopneumatic Device    PT Next Visit Plan pendulums, nustep, PROM, vasopneumatic as needed    Consulted and Agree with Plan of Care Patient             Patient will benefit from skilled therapeutic intervention in order to improve the following deficits and impairments:  Decreased range of motion, Impaired UE functional use, Decreased activity tolerance, Pain, Decreased strength  Visit Diagnosis: Stiffness of right shoulder, not elsewhere classified  Acute pain of right shoulder     Problem List Patient Active Problem List   Diagnosis Date Noted   S/P TKR (total knee replacement) using cement 03/16/2020   Hyperlipidemia 01/31/2020   AKI (acute kidney injury) ([place]) 09/15/2019   Right flank pain 09/15/2019   Anemia 09/15/2019   Abnormal nuclear cardiac imaging test    CHF (congestive heart failure) ([place]) 07/09/2019   Dyspnea 07/09/2019   Respiratory failure with hypoxia ([place]) 07/09/2019   Shoulder pain 07/09/2019   Dyspnea on exertion 06/03/2019   Bilateral lower extremity edema 06/03/2019   Left bundle branch block 06/03/2019   Iron deficiency anemia due to chronic blood loss 03/10/2017   Postoperative wound dehiscence 12/02/2016   E. coli UTI    Abnormal urinalysis    Medication side effect    Neuropathic pain    History of DVT (deep vein thrombosis)    HTN (hypertension)    RLS (restless legs syndrome)    Urinary retention    Hypokalemia    Hypoalbuminemia due to protein-calorie malnutrition (HCC)     Acute blood loss anemia    Lumbar stenosis with neurogenic claudication 11/18/2016   Spondylolisthesis of lumbar region 11/12/2016   Hereditary hemochromatosis ([place]) 07/28/2016   Prothrombin gene mutation ([place]) 05/30/2013   Right leg DVT ([place]) 05/30/2013   Hemochromatosis 07/29/2012    Kathrynn Ducking, PTA 04/04/2021, 1:54 PM  [facility] [address] [place], [place], [address] Phone: [phone]   Fax:  [phone]  Name: JASHA HODZIC MRN: [id number] Date of Birth: 09-24-1940

## 2021-04-08 ENCOUNTER — Other Ambulatory Visit: Payer: Self-pay

## 2021-04-08 ENCOUNTER — Ambulatory Visit: Payer: PPO

## 2021-04-08 DIAGNOSIS — M25611 Stiffness of right shoulder, not elsewhere classified: Secondary | ICD-10-CM

## 2021-04-08 DIAGNOSIS — M25511 Pain in right shoulder: Secondary | ICD-10-CM

## 2021-04-08 NOTE — Therapy (Signed)
[facility] [place], [place], [address] Phone: [phone]   Fax:  [phone]  Physical Therapy Treatment  Patient Details  Name: [name] MRN: [id number] Date of Birth: Oct 30, 1940 Referring Provider (PT): Supple   Encounter Date: 04/08/2021   PT End of Session - 04/08/21 1113     Visit Number 3    Number of Visits 12    Date for PT Re-Evaluation 06/21/21    Authorization Type FOTO    PT Start Time 1115    PT Stop Time 1202    PT Time Calculation (min) 47 min    Activity Tolerance Patient tolerated treatment well    Behavior During Therapy [facility] for tasks assessed/performed             Past Medical History:  Diagnosis Date   Anemia    Arthritis    osteoarthritis Shoulder,neck   Bell's palsy 1980s   CHF (congestive heart failure) ([place]) 06/2019   Chronic kidney disease    Stage III   Dyspnea 06/2019   On exertion   GERD (gastroesophageal reflux disease)    Hemochromatosis    History of hiatal hernia    Hypertension    Iron deficiency anemia due to chronic blood loss 03/10/2017   PE (pulmonary embolism) 2007   x2   Peripheral vascular disease ([place])    DVT Rt leg   Pneumonia 1990s   "walking" pneumonia   Prothrombin gene mutation ([place]) 05/30/2013   Restless legs    Right leg DVT ([place]) 05/30/2013   Stroke ([place])    found on a MRI, she's not aware otherwise    Past Surgical History:  Procedure Laterality Date   BACK SURGERY     CARDIAC CATHETERIZATION Bilateral 2005   COLONOSCOPY     IVC FILTER INSERTION N/A 03/12/2020   Procedure: IVC FILTER INSERTION;  Surgeon: Waynetta Sandy, MD;  Location: [place] [facility];  Service: Cardiovascular;  Laterality: N/A;   IVC FILTER REMOVAL N/A 06/11/2020   Procedure: IVC FILTER REMOVAL;  Surgeon: Waynetta Sandy, MD;  Location: [place] [facility];  Service: Cardiovascular;  Laterality: N/A;   REVERSE SHOULDER ARTHROPLASTY Right 03/07/2021    Procedure: REVERSE SHOULDER ARTHROPLASTY;  Surgeon: Justice Britain, MD;  Location: [facility];  Service: Orthopedics;  Laterality: Right;  117min   RIGHT/LEFT HEART CATH AND CORONARY ANGIOGRAPHY N/A 07/12/2019   Procedure: RIGHT/LEFT HEART CATH AND CORONARY ANGIOGRAPHY;  Surgeon: Troy Sine, MD;  Location: [place] [facility];  Service: Cardiovascular;  Laterality: N/A;   SHOULDER SURGERY Bilateral    TOTAL KNEE ARTHROPLASTY Right 03/16/2020   Procedure: TOTAL KNEE ARTHROPLASTY;  Surgeon: Susa Day, MD;  Location: [facility];  Service: Orthopedics;  Laterality: Right;  2.5 hrs    There were no vitals filed for this visit.   Subjective Assessment - 04/08/21 1113     Subjective Pt arrives for today's treatment session denying shoulder pain at rest, but pain can get up to 9/56 with certain movement.  Pt is not consistent or predictable.    Pertinent History history of back surgery (July 2018), CHF, HTN, history of prior right shoulder surgery    Limitations House hold activities    Patient Stated Goals be able to use her right arm,    Currently in Pain? No/denies    Pain Onset 1 to 4 weeks ago  [place] Adult PT Treatment/Exercise - 04/08/21 0001       Shoulder Exercises: ROM/Strengthening   Nustep Lvl 2 x 10 mins      Modalities   Modalities Electrical Stimulation;Moist Heat      Moist Heat Therapy   Number Minutes Moist Heat 15 Minutes    Moist Heat Location Shoulder      Electrical Stimulation   Electrical Stimulation Location Right shoulder    Electrical Stimulation Action Pre-mod    Electrical Stimulation Parameters 80-150 Hz x 15 mins    Electrical Stimulation Goals Pain;Tone      Manual Therapy   Manual Therapy Soft tissue mobilization;Passive ROM    Soft tissue mobilization STM/W to right deltoid and bicep to decrease pain and tone    Passive ROM Gentle PROM in flex, ER, and IR with gentle hold at end range                           PT Long Term Goals - 04/01/21 1307       PT LONG TERM GOAL #1   Title Patient will be independent with her HEP.    Time 6    Period Weeks    Status New    Target Date 05/13/21      PT LONG TERM GOAL #2   Title Patient will be able to demonstrate at least 110 degrees of active right shoulder flexion for improved function shopping for groceries.    Time 6    Period Weeks    Status New    Target Date 05/13/21      PT LONG TERM GOAL #3   Title Patient will be able to demonstrate at least 110 degrees of active left shoulder abduction for improved function reaching for her dishes.    Time 6    Period Weeks    Status New    Target Date 05/13/21      PT LONG TERM GOAL #4   Title Patient will report no more than 4/10 pain in her right shoulder with eating and dressing.    Time 6    Period Weeks    Status New    Target Date 05/13/21                   Plan - 04/08/21 1114     Clinical Impression Statement Pt arrives for today's treatment session denying pain at rest, but reports up to 8/10 right shoulder pain with certain movements that are not predictable or consistent.  Pt able to tolerate increased time on Nustep during warmup with no pain in right shoulder above 2/10.  STW/M performed to right upper deltoid and proximal bicep to decrease pain and tone.  PROM performed to tolerance with gentle hold at end ranges.  Normal responses noted to estim and MH at completion of today's treatment session.  Pt denied any pain at completion of today's treatment session.    Personal Factors and Comorbidities Comorbidity 1;Past/Current Experience;Comorbidity 2;Comorbidity 3+    Comorbidities history of back surgery (July 2018), CHF, HTN, history of prior right shoulder surgery    Examination-Activity Limitations Bathing;Reach Overhead;Self Feeding;Carry;Sleep;Dressing;Hygiene/Grooming    Examination-Participation Restrictions Other    Stability/Clinical  Decision Making Evolving/Moderate complexity    Rehab Potential Good    PT Frequency 2x / week    PT Duration 6 weeks    PT Treatment/Interventions ADLs/Self Care Home Management;Electrical Stimulation;Ultrasound;Neuromuscular re-education;Therapeutic exercise;Therapeutic activities;Patient/family education;Manual techniques;Passive range  of motion;Vasopneumatic Device    PT Next Visit Plan pendulums, nustep, PROM, vasopneumatic as needed    Consulted and Agree with Plan of Care Patient             Patient will benefit from skilled therapeutic intervention in order to improve the following deficits and impairments:  Decreased range of motion, Impaired UE functional use, Decreased activity tolerance, Pain, Decreased strength  Visit Diagnosis: Stiffness of right shoulder, not elsewhere classified  Acute pain of right shoulder     Problem List Patient Active Problem List   Diagnosis Date Noted   S/P TKR (total knee replacement) using cement 03/16/2020   Hyperlipidemia 01/31/2020   AKI (acute kidney injury) ([place]) 09/15/2019   Right flank pain 09/15/2019   Anemia 09/15/2019   Abnormal nuclear cardiac imaging test    CHF (congestive heart failure) ([place]) 07/09/2019   Dyspnea 07/09/2019   Respiratory failure with hypoxia ([place]) 07/09/2019   Shoulder pain 07/09/2019   Dyspnea on exertion 06/03/2019   Bilateral lower extremity edema 06/03/2019   Left bundle branch block 06/03/2019   Iron deficiency anemia due to chronic blood loss 03/10/2017   Postoperative wound dehiscence 12/02/2016   E. coli UTI    Abnormal urinalysis    Medication side effect    Neuropathic pain    History of DVT (deep vein thrombosis)    HTN (hypertension)    RLS (restless legs syndrome)    Urinary retention    Hypokalemia    Hypoalbuminemia due to protein-calorie malnutrition (HCC)    Acute blood loss anemia    Lumbar stenosis with neurogenic claudication 11/18/2016   Spondylolisthesis of lumbar  region 11/12/2016   Hereditary hemochromatosis ([place]) 07/28/2016   Prothrombin gene mutation ([place]) 05/30/2013   Right leg DVT ([place]) 05/30/2013   Hemochromatosis 07/29/2012    Kathrynn Ducking, PTA 04/08/2021, 12:05 PM  [facility] [address] [place], [place], [address] Phone: [phone]   Fax:  [phone]  Name: [name] MRN: [id number] Date of Birth: Mar 05, 1941

## 2021-04-11 ENCOUNTER — Ambulatory Visit: Payer: PPO

## 2021-04-11 ENCOUNTER — Other Ambulatory Visit: Payer: Self-pay

## 2021-04-11 DIAGNOSIS — M25611 Stiffness of right shoulder, not elsewhere classified: Secondary | ICD-10-CM

## 2021-04-11 DIAGNOSIS — M25511 Pain in right shoulder: Secondary | ICD-10-CM

## 2021-04-11 NOTE — Therapy (Signed)
[facility] [place], [place], [address] Phone: [phone]   Fax:  [phone]  Physical Therapy Treatment  Patient Details  Name: [name] MRN: [id number] Date of Birth: 05/10/1940 Referring Provider (PT): Supple   Encounter Date: 04/11/2021   PT End of Session - 04/11/21 1152     Visit Number 4    Number of Visits 12    Date for PT Re-Evaluation 06/21/21    Authorization Type FOTO    PT Start Time 1115    PT Stop Time 1200    PT Time Calculation (min) 45 min    Activity Tolerance Patient tolerated treatment well    Behavior During Therapy [facility] for tasks assessed/performed             Past Medical History:  Diagnosis Date   Anemia    Arthritis    osteoarthritis Shoulder,neck   Bell's palsy 1980s   CHF (congestive heart failure) ([place]) 06/2019   Chronic kidney disease    Stage III   Dyspnea 06/2019   On exertion   GERD (gastroesophageal reflux disease)    Hemochromatosis    History of hiatal hernia    Hypertension    Iron deficiency anemia due to chronic blood loss 03/10/2017   PE (pulmonary embolism) 2007   x2   Peripheral vascular disease ([place])    DVT Rt leg   Pneumonia 1990s   "walking" pneumonia   Prothrombin gene mutation ([place]) 05/30/2013   Restless legs    Right leg DVT ([place]) 05/30/2013   Stroke ([place])    found on a MRI, she's not aware otherwise    Past Surgical History:  Procedure Laterality Date   BACK SURGERY     CARDIAC CATHETERIZATION Bilateral 2005   COLONOSCOPY     IVC FILTER INSERTION N/A 03/12/2020   Procedure: IVC FILTER INSERTION;  Surgeon: Waynetta Sandy, MD;  Location: [place];  Service: Cardiovascular;  Laterality: N/A;   IVC FILTER REMOVAL N/A 06/11/2020   Procedure: IVC FILTER REMOVAL;  Surgeon: Waynetta Sandy, MD;  Location: [place] [facility];  Service: Cardiovascular;  Laterality: N/A;   REVERSE SHOULDER ARTHROPLASTY Right 03/07/2021    Procedure: REVERSE SHOULDER ARTHROPLASTY;  Surgeon: Justice Britain, MD;  [facility];  Service: Orthopedics;  Laterality: Right;  144min   RIGHT/LEFT HEART CATH AND CORONARY ANGIOGRAPHY N/A 07/12/2019   Procedure: RIGHT/LEFT HEART CATH AND CORONARY ANGIOGRAPHY;  Surgeon: Troy Sine, MD;  Location: [place] [facility];  Service: Cardiovascular;  Laterality: N/A;   SHOULDER SURGERY Bilateral    TOTAL KNEE ARTHROPLASTY Right 03/16/2020   Procedure: TOTAL KNEE ARTHROPLASTY;  Surgeon: Susa Day, MD;  [facility];  Service: Orthopedics;  Laterality: Right;  2.5 hrs    There were no vitals filed for this visit.   Subjective Assessment - 04/11/21 1151     Subjective Pt arrives for today's treatment session denying any pain at rest.  Pt state that she felt really good after last treatment session.    Pertinent History history of back surgery (July 2018), CHF, HTN, history of prior right shoulder surgery    Limitations House hold activities    Patient Stated Goals be able to use her right arm,    Currently in Pain? No/denies    Pain Onset 1 to 4 weeks ago  [place] Adult PT Treatment/Exercise - 04/11/21 0001       Shoulder Exercises: ROM/Strengthening   Nustep Lvl 2 x 2 mins   Stopped due to pain     Modalities   Modalities Electrical Stimulation;Moist Heat      Moist Heat Therapy   Number Minutes Moist Heat 15 Minutes    Moist Heat Location Shoulder      Electrical Stimulation   Electrical Stimulation Location Right shoulder    Electrical Stimulation Action Pre-mod    Electrical Stimulation Parameters 80-150 Hz x 15 mins    Electrical Stimulation Goals Pain;Tone      Manual Therapy   Manual Therapy Soft tissue mobilization;Passive ROM    Soft tissue mobilization STM/W to right deltoid and bicep to decrease pain and tone    Passive ROM Gentle PROM in flex, ER, and IR with gentle hold at end range                           PT Long Term Goals - 04/01/21 1307       PT LONG TERM GOAL #1   Title Patient will be independent with her HEP.    Time 6    Period Weeks    Status New    Target Date 05/13/21      PT LONG TERM GOAL #2   Title Patient will be able to demonstrate at least 110 degrees of active right shoulder flexion for improved function shopping for groceries.    Time 6    Period Weeks    Status New    Target Date 05/13/21      PT LONG TERM GOAL #3   Title Patient will be able to demonstrate at least 110 degrees of active left shoulder abduction for improved function reaching for her dishes.    Time 6    Period Weeks    Status New    Target Date 05/13/21      PT LONG TERM GOAL #4   Title Patient will report no more than 4/10 pain in her right shoulder with eating and dressing.    Time 6    Period Weeks    Status New    Target Date 05/13/21                   Plan - 04/11/21 1152     Clinical Impression Statement Pt arrives for today's treatment session denying any pain while at rest.  Pt stated that she felt really good after her last treatment session.  Pt only able to tolerate 2 mins on the Nustep for warmup due to increased pain to 8/10 in right shoulder.  PROM performed in flexion, abduction, and ER/IR to tolerance with gentle hold at end range.  Pt requiring extensive cues to relax during PROM.  STW/M performed to deltoid and bicep to decrease pain and tone.  Normal responses to estim and MH noted.  Pt reported 0/10 right shoulder pain at completion of today's treatment session.    Personal Factors and Comorbidities Comorbidity 1;Past/Current Experience;Comorbidity 2;Comorbidity 3+    Comorbidities history of back surgery (July 2018), CHF, HTN, history of prior right shoulder surgery    Examination-Activity Limitations Bathing;Reach Overhead;Self Feeding;Carry;Sleep;Dressing;Hygiene/Grooming    Examination-Participation Restrictions Other     Stability/Clinical Decision Making Evolving/Moderate complexity    Rehab Potential Good    PT Frequency 2x / week    PT Duration 6 weeks  PT Treatment/Interventions ADLs/Self Care Home Management;Electrical Stimulation;Ultrasound;Neuromuscular re-education;Therapeutic exercise;Therapeutic activities;Patient/family education;Manual techniques;Passive range of motion;Vasopneumatic Device    PT Next Visit Plan pendulums, nustep, PROM, vasopneumatic as needed    Consulted and Agree with Plan of Care Patient             Patient will benefit from skilled therapeutic intervention in order to improve the following deficits and impairments:  Decreased range of motion, Impaired UE functional use, Decreased activity tolerance, Pain, Decreased strength  Visit Diagnosis: Stiffness of right shoulder, not elsewhere classified  Acute pain of right shoulder     Problem List Patient Active Problem List   Diagnosis Date Noted   S/P TKR (total knee replacement) using cement 03/16/2020   Hyperlipidemia 01/31/2020   AKI (acute kidney injury) ([place]) 09/15/2019   Right flank pain 09/15/2019   Anemia 09/15/2019   Abnormal nuclear cardiac imaging test    CHF (congestive heart failure) ([place]) 07/09/2019   Dyspnea 07/09/2019   Respiratory failure with hypoxia ([place]) 07/09/2019   Shoulder pain 07/09/2019   Dyspnea on exertion 06/03/2019   Bilateral lower extremity edema 06/03/2019   Left bundle branch block 06/03/2019   Iron deficiency anemia due to chronic blood loss 03/10/2017   Postoperative wound dehiscence 12/02/2016   E. coli UTI    Abnormal urinalysis    Medication side effect    Neuropathic pain    History of DVT (deep vein thrombosis)    HTN (hypertension)    RLS (restless legs syndrome)    Urinary retention    Hypokalemia    Hypoalbuminemia due to protein-calorie malnutrition (HCC)    Acute blood loss anemia    Lumbar stenosis with neurogenic claudication 11/18/2016    Spondylolisthesis of lumbar region 11/12/2016   Hereditary hemochromatosis ([place]) 07/28/2016   Prothrombin gene mutation ([place]) 05/30/2013   Right leg DVT ([place]) 05/30/2013   Hemochromatosis 07/29/2012    Kathrynn Ducking, PTA 04/11/2021, 12:05 PM  [facility] [address] [place], [place], [address] Phone: [phone]   Fax:  [phone]  Name: [name] MRN: [id number] Date of Birth: 1940/12/14

## 2021-04-15 ENCOUNTER — Other Ambulatory Visit: Payer: Self-pay

## 2021-04-15 ENCOUNTER — Ambulatory Visit: Payer: PPO | Admitting: Physical Therapy

## 2021-04-15 ENCOUNTER — Encounter: Payer: Self-pay | Admitting: Physical Therapy

## 2021-04-15 DIAGNOSIS — M25611 Stiffness of right shoulder, not elsewhere classified: Secondary | ICD-10-CM | POA: Diagnosis not present

## 2021-04-15 DIAGNOSIS — M25511 Pain in right shoulder: Secondary | ICD-10-CM

## 2021-04-15 NOTE — Therapy (Signed)
[facility] [place], [place], [address] Phone: [phone]   Fax:  [phone]  Physical Therapy Treatment  Patient Details  Name: [name] MRN: [id number] Date of Birth: [birth year] Referring Provider (PT): Supple   Encounter Date: 04/15/2021   PT End of Session - 04/15/21 0957     Visit Number 5    Number of Visits 12    Date for PT Re-Evaluation 06/21/21    Authorization Type FOTO    PT Start Time 0952    PT Stop Time 1033    PT Time Calculation (min) 41 min    Activity Tolerance Patient tolerated treatment well    Behavior During Therapy [facility] for tasks assessed/performed             Past Medical History:  Diagnosis Date   Anemia    Arthritis    osteoarthritis Shoulder,neck   Bell's palsy 1980s   CHF (congestive heart failure) ([place]) 06/2019   Chronic kidney disease    Stage III   Dyspnea 06/2019   On exertion   GERD (gastroesophageal reflux disease)    Hemochromatosis    History of hiatal hernia    Hypertension    Iron deficiency anemia due to chronic blood loss 03/10/2017   PE (pulmonary embolism) 2007   x2   Peripheral vascular disease ([place])    DVT Rt leg   Pneumonia 1990s   "walking" pneumonia   Prothrombin gene mutation ([place]) 05/30/2013   Restless legs    Right leg DVT ([place]) 05/30/2013   Stroke ([place])    found on a MRI, she's not aware otherwise    Past Surgical History:  Procedure Laterality Date   BACK SURGERY     CARDIAC CATHETERIZATION Bilateral 2005   COLONOSCOPY     IVC FILTER INSERTION N/A 03/12/2020   Procedure: IVC FILTER INSERTION;  Surgeon: Waynetta Sandy, MD;  Location: [facility];  Service: Cardiovascular;  Laterality: N/A;   IVC FILTER REMOVAL N/A 06/11/2020   Procedure: IVC FILTER REMOVAL;  Surgeon: Waynetta Sandy, MD;  Location: [place] [facility];  Service: Cardiovascular;  Laterality: N/A;   REVERSE SHOULDER ARTHROPLASTY Right 03/07/2021    Procedure: REVERSE SHOULDER ARTHROPLASTY;  Surgeon: Justice Britain, MD;  Location: [facility];  Service: Orthopedics;  Laterality: Right;  119min   RIGHT/LEFT HEART CATH AND CORONARY ANGIOGRAPHY N/A 07/12/2019   Procedure: RIGHT/LEFT HEART CATH AND CORONARY ANGIOGRAPHY;  Surgeon: Troy Sine, MD;  Location: [facility];  Service: Cardiovascular;  Laterality: N/A;   SHOULDER SURGERY Bilateral    TOTAL KNEE ARTHROPLASTY Right 03/16/2020   Procedure: TOTAL KNEE ARTHROPLASTY;  Surgeon: Susa Day, MD;  Location: [facility];  Service: Orthopedics;  Laterality: Right;  2.5 hrs    There were no vitals filed for this visit.   Subjective Assessment - 04/15/21 0955     Subjective Reports more pain recently up to 10/10. Reported the pain is very intermittant and with certain motions. States that her husband hit a bump in the road and she had pain with.    Pertinent History history of back surgery (July 2018), CHF, HTN, history of prior right shoulder surgery    Limitations House hold activities    Patient Stated Goals be able to use her right arm,    Currently in Pain? Yes    Pain Score 10-Worst pain ever    Pain Location Shoulder    Pain Orientation Right  Pain Descriptors / Indicators Discomfort    Pain Type Surgical pain    Pain Onset 1 to 4 weeks ago    Pain Frequency Intermittent                [facility] PT Assessment - 04/15/21 0001       Assessment   Medical Diagnosis Right RTSA on 03/07/21    Referring Provider (PT) Supple    Onset Date/Surgical Date 03/07/21    Hand Dominance Right    Next MD Visit 04/24/21    Prior Therapy No      Precautions   Precautions Shoulder    Type of Shoulder Precautions RTSA    Shoulder Interventions For comfort      ROM / Strength   AROM / PROM / Strength --                           [facility] Adult PT Treatment/Exercise - 04/15/21 0001       Shoulder Exercises: Standing   External Rotation --    Theraband Level  (Shoulder External Rotation) --    Internal Rotation --    Theraband Level (Shoulder Internal Rotation) --    Extension --    Theraband Level (Shoulder Extension) --    Row --    Theraband Level (Shoulder Row) --      Shoulder Exercises: ROM/Strengthening   Nustep L5 x10 min (intermittant R UE use)      Modalities   Modalities Electrical Stimulation;Moist Heat      Moist Heat Therapy   Number Minutes Moist Heat 15 Minutes    Moist Heat Location Shoulder      Electrical Stimulation   Electrical Stimulation Location Right shoulder    Electrical Stimulation Action Pre-Mod    Electrical Stimulation Parameters 80-150 hz x15 min    Electrical Stimulation Goals Pain                          PT Long Term Goals - 04/01/21 1307       PT LONG TERM GOAL #1   Title Patient will be independent with her HEP.    Time 6    Period Weeks    Status New    Target Date 05/13/21      PT LONG TERM GOAL #2   Title Patient will be able to demonstrate at least 110 degrees of active right shoulder flexion for improved function shopping for groceries.    Time 6    Period Weeks    Status New    Target Date 05/13/21      PT LONG TERM GOAL #3   Title Patient will be able to demonstrate at least 110 degrees of active left shoulder abduction for improved function reaching for her dishes.    Time 6    Period Weeks    Status New    Target Date 05/13/21      PT LONG TERM GOAL #4   Title Patient will report no more than 4/10 pain in her right shoulder with eating and dressing.    Time 6    Period Weeks    Status New    Target Date 05/13/21                   Plan - 04/15/21 1038     Clinical Impression Statement Patient presented in clinic with reports of more R  shoulder pain recently up to 10/10. Patient limited with nustep somewhat as she experienced pain with too much protraction and retraction of handlebar. Patient advised within painfree range. Firm end feel noted  for PROM flexion but empty end feels for ER due to pain. Normal modalities response noted following removal of the modalities.    Personal Factors and Comorbidities Comorbidity 1;Past/Current Experience;Comorbidity 2;Comorbidity 3+    Comorbidities history of back surgery (July 2018), CHF, HTN, history of prior right shoulder surgery    Examination-Activity Limitations Bathing;Reach Overhead;Self Feeding;Carry;Sleep;Dressing;Hygiene/Grooming    Examination-Participation Restrictions Other    Stability/Clinical Decision Making Evolving/Moderate complexity    Rehab Potential Good    PT Frequency 2x / week    PT Duration 6 weeks    PT Treatment/Interventions ADLs/Self Care Home Management;Electrical Stimulation;Ultrasound;Neuromuscular re-education;Therapeutic exercise;Therapeutic activities;Patient/family education;Manual techniques;Passive range of motion;Vasopneumatic Device    PT Next Visit Plan pendulums, nustep, PROM, vasopneumatic as needed    Consulted and Agree with Plan of Care Patient             Patient will benefit from skilled therapeutic intervention in order to improve the following deficits and impairments:  Decreased range of motion, Impaired UE functional use, Decreased activity tolerance, Pain, Decreased strength  Visit Diagnosis: Stiffness of right shoulder, not elsewhere classified  Acute pain of right shoulder     Problem List Patient Active Problem List   Diagnosis Date Noted   S/P TKR (total knee replacement) using cement 03/16/2020   Hyperlipidemia 01/31/2020   AKI (acute kidney injury) ([place]) 09/15/2019   Right flank pain 09/15/2019   Anemia 09/15/2019   Abnormal nuclear cardiac imaging test    CHF (congestive heart failure) ([place]) 07/09/2019   Dyspnea 07/09/2019   Respiratory failure with hypoxia ([place]) 07/09/2019   Shoulder pain 07/09/2019   Dyspnea on exertion 06/03/2019   Bilateral lower extremity edema 06/03/2019   Left bundle branch block  06/03/2019   Iron deficiency anemia due to chronic blood loss 03/10/2017   Postoperative wound dehiscence 12/02/2016   E. coli UTI    Abnormal urinalysis    Medication side effect    Neuropathic pain    History of DVT (deep vein thrombosis)    HTN (hypertension)    RLS (restless legs syndrome)    Urinary retention    Hypokalemia    Hypoalbuminemia due to protein-calorie malnutrition (HCC)    Acute blood loss anemia    Lumbar stenosis with neurogenic claudication 11/18/2016   Spondylolisthesis of lumbar region 11/12/2016   Hereditary hemochromatosis ([place]) 07/28/2016   Prothrombin gene mutation ([place]) 05/30/2013   Right leg DVT ([place]) 05/30/2013   Hemochromatosis 07/29/2012    Standley Brooking, PTA 04/15/2021, 11:07 AM  [facility] [address] [place], [place], [address] Phone: [phone]   Fax:  [phone]  Name: DEANNA WIATER MRN: [id number] Date of Birth: Jul 18, 1940

## 2021-04-18 ENCOUNTER — Other Ambulatory Visit: Payer: Self-pay

## 2021-04-18 ENCOUNTER — Ambulatory Visit: Payer: PPO

## 2021-04-18 DIAGNOSIS — M25511 Pain in right shoulder: Secondary | ICD-10-CM

## 2021-04-18 DIAGNOSIS — M25611 Stiffness of right shoulder, not elsewhere classified: Secondary | ICD-10-CM

## 2021-04-18 NOTE — Therapy (Signed)
[facility] [place], [place], [address] Phone: [phone]   Fax:  [phone]  Physical Therapy Treatment  Patient Details  Name: [name] MRN: [id number] Date of Birth: Dec 08, 1940 Referring Provider (PT): Supple   Encounter Date: 04/18/2021   PT End of Session - 04/18/21 0903     Visit Number 6    Number of Visits 12    Date for PT Re-Evaluation 06/21/21    Authorization Type FOTO    PT Start Time 0900    PT Stop Time 0946    PT Time Calculation (min) 46 min    Activity Tolerance Patient tolerated treatment well    Behavior During Therapy [facility] for tasks assessed/performed             Past Medical History:  Diagnosis Date   Anemia    Arthritis    osteoarthritis Shoulder,neck   Bell's palsy 1980s   CHF (congestive heart failure) ([place]) 06/2019   Chronic kidney disease    Stage III   Dyspnea 06/2019   On exertion   GERD (gastroesophageal reflux disease)    Hemochromatosis    History of hiatal hernia    Hypertension    Iron deficiency anemia due to chronic blood loss 03/10/2017   PE (pulmonary embolism) 2007   x2   Peripheral vascular disease ([place])    DVT Rt leg   Pneumonia 1990s   "walking" pneumonia   Prothrombin gene mutation ([place]) 05/30/2013   Restless legs    Right leg DVT ([place]) 05/30/2013   Stroke ([place])    found on a MRI, she's not aware otherwise    Past Surgical History:  Procedure Laterality Date   BACK SURGERY     CARDIAC CATHETERIZATION Bilateral 2005   COLONOSCOPY     IVC FILTER INSERTION N/A 03/12/2020   Procedure: IVC FILTER INSERTION;  Surgeon: Waynetta Sandy, MD;  Location: [facility] [facility];  Service: Cardiovascular;  Laterality: N/A;   IVC FILTER REMOVAL N/A 06/11/2020   Procedure: IVC FILTER REMOVAL;  Surgeon: Waynetta Sandy, MD;  Location: [facility];  Service: Cardiovascular;  Laterality: N/A;   REVERSE SHOULDER ARTHROPLASTY Right 03/07/2021    Procedure: REVERSE SHOULDER ARTHROPLASTY;  Surgeon: Justice Britain, MD;  [facility];  Service: Orthopedics;  Laterality: Right;  167min   RIGHT/LEFT HEART CATH AND CORONARY ANGIOGRAPHY N/A 07/12/2019   Procedure: RIGHT/LEFT HEART CATH AND CORONARY ANGIOGRAPHY;  Surgeon: Troy Sine, MD;  Location: [facility];  Service: Cardiovascular;  Laterality: N/A;   SHOULDER SURGERY Bilateral    TOTAL KNEE ARTHROPLASTY Right 03/16/2020   Procedure: TOTAL KNEE ARTHROPLASTY;  Surgeon: Susa Day, MD;  [facility];  Service: Orthopedics;  Laterality: Right;  2.5 hrs    There were no vitals filed for this visit.   Subjective Assessment - 04/18/21 0901     Subjective Pt arrives for today's treamtent session denying any shoulder pain while at rest, but states that shoulder pain can get up to 85/88 with certain movement.    Pertinent History history of back surgery (July 2018), CHF, HTN, history of prior right shoulder surgery    Limitations House hold activities    Patient Stated Goals be able to use her right arm,    Currently in Pain? No/denies    Pain Onset 1 to 4 weeks ago  [place] Adult PT Treatment/Exercise - 04/18/21 0001       Shoulder Exercises: ROM/Strengthening   Nustep Lvl 5 x 15 mins      Modalities   Modalities Electrical Stimulation;Moist Heat      Moist Heat Therapy   Number Minutes Moist Heat 15 Minutes    Moist Heat Location Shoulder      Electrical Stimulation   Electrical Stimulation Location Right shoulder    Electrical Stimulation Action Pre-mod    Electrical Stimulation Parameters 80-150 Hz x 15 min    Electrical Stimulation Goals Pain      Manual Therapy   Manual Therapy Soft tissue mobilization;Passive ROM    Soft tissue mobilization STM/W to right deltoid and bicep to decrease pain and tone    Passive ROM Gentle PROM in flex, ER, and IR with gentle hold at end range                           PT Long Term Goals - 04/01/21 1307       PT LONG TERM GOAL #1   Title Patient will be independent with her HEP.    Time 6    Period Weeks    Status New    Target Date 05/13/21      PT LONG TERM GOAL #2   Title Patient will be able to demonstrate at least 110 degrees of active right shoulder flexion for improved function shopping for groceries.    Time 6    Period Weeks    Status New    Target Date 05/13/21      PT LONG TERM GOAL #3   Title Patient will be able to demonstrate at least 110 degrees of active left shoulder abduction for improved function reaching for her dishes.    Time 6    Period Weeks    Status New    Target Date 05/13/21      PT LONG TERM GOAL #4   Title Patient will report no more than 4/10 pain in her right shoulder with eating and dressing.    Time 6    Period Weeks    Status New    Target Date 05/13/21                   Plan - 04/18/21 0903     Clinical Impression Statement Pt arrives for today's treatment session denying any shoulder pain at rest, but states that pain can get up to 88/50 with certain movements.  Pt able to tolerate 15 mins on Nustep for warm-up with constant RUE use without complaint.  STW/M performed to right deltoid and bicep to decrease tone.  PROM performed in flexion, IR and ER with gentle hold in all directions.  Limited in ER due to increase in pain.  Normal responses to modalities noted.  Pt reported 0/10 right shouler pain at completion of today's treatment session.    Personal Factors and Comorbidities Comorbidity 1;Past/Current Experience;Comorbidity 2;Comorbidity 3+    Comorbidities history of back surgery (July 2018), CHF, HTN, history of prior right shoulder surgery    Examination-Activity Limitations Bathing;Reach Overhead;Self Feeding;Carry;Sleep;Dressing;Hygiene/Grooming    Examination-Participation Restrictions Other    Stability/Clinical Decision Making Evolving/Moderate  complexity    Rehab Potential Good    PT Frequency 2x / week    PT Duration 6 weeks    PT Treatment/Interventions ADLs/Self Care Home Management;Electrical Stimulation;Ultrasound;Neuromuscular re-education;Therapeutic exercise;Therapeutic activities;Patient/family education;Manual techniques;Passive range of motion;Vasopneumatic Device  PT Next Visit Plan pendulums, nustep, PROM, vasopneumatic as needed    Consulted and Agree with Plan of Care Patient             Patient will benefit from skilled therapeutic intervention in order to improve the following deficits and impairments:  Decreased range of motion, Impaired UE functional use, Decreased activity tolerance, Pain, Decreased strength  Visit Diagnosis: Stiffness of right shoulder, not elsewhere classified  Acute pain of right shoulder     Problem List Patient Active Problem List   Diagnosis Date Noted   S/P TKR (total knee replacement) using cement 03/16/2020   Hyperlipidemia 01/31/2020   AKI (acute kidney injury) ([place]) 09/15/2019   Right flank pain 09/15/2019   Anemia 09/15/2019   Abnormal nuclear cardiac imaging test    CHF (congestive heart failure) ([place]) 07/09/2019   Dyspnea 07/09/2019   Respiratory failure with hypoxia ([place]) 07/09/2019   Shoulder pain 07/09/2019   Dyspnea on exertion 06/03/2019   Bilateral lower extremity edema 06/03/2019   Left bundle branch block 06/03/2019   Iron deficiency anemia due to chronic blood loss 03/10/2017   Postoperative wound dehiscence 12/02/2016   E. coli UTI    Abnormal urinalysis    Medication side effect    Neuropathic pain    History of DVT (deep vein thrombosis)    HTN (hypertension)    RLS (restless legs syndrome)    Urinary retention    Hypokalemia    Hypoalbuminemia due to protein-calorie malnutrition (HCC)    Acute blood loss anemia    Lumbar stenosis with neurogenic claudication 11/18/2016   Spondylolisthesis of lumbar region 11/12/2016   Hereditary  hemochromatosis ([place]) 07/28/2016   Prothrombin gene mutation ([place]) 05/30/2013   Right leg DVT ([place]) 05/30/2013   Hemochromatosis 07/29/2012    Kathrynn Ducking, PTA 04/18/2021, 9:50 AM  [facility] [address] [place], [place], [address] Phone: [phone]   Fax:  [phone]  Name: ARICELA BERTAGNOLLI MRN: [id number] Date of Birth: Nov 14, 1940

## 2021-04-23 ENCOUNTER — Other Ambulatory Visit: Payer: Self-pay

## 2021-04-23 ENCOUNTER — Ambulatory Visit: Payer: PPO

## 2021-04-23 DIAGNOSIS — M25511 Pain in right shoulder: Secondary | ICD-10-CM

## 2021-04-23 DIAGNOSIS — M25611 Stiffness of right shoulder, not elsewhere classified: Secondary | ICD-10-CM

## 2021-04-23 NOTE — Therapy (Signed)
[facility] [place], [place], [address] Phone: [phone]   Fax:  [phone]  Physical Therapy Treatment  Patient Details  Name: [name] MRN: [id number] Date of Birth: [birth year] Referring Provider (PT): Supple   Encounter Date: 04/23/2021   PT End of Session - 04/23/21 1038     Visit Number 7    Number of Visits 12    Date for PT Re-Evaluation 06/21/21    Authorization Type FOTO    PT Start Time 1030    PT Stop Time 1115    PT Time Calculation (min) 45 min    Activity Tolerance Patient tolerated treatment well    Behavior During Therapy [facility] for tasks assessed/performed             Past Medical History:  Diagnosis Date   Anemia    Arthritis    osteoarthritis Shoulder,neck   Bell's palsy 1980s   CHF (congestive heart failure) ([place]) 06/2019   Chronic kidney disease    Stage III   Dyspnea 06/2019   On exertion   GERD (gastroesophageal reflux disease)    Hemochromatosis    History of hiatal hernia    Hypertension    Iron deficiency anemia due to chronic blood loss 03/10/2017   PE (pulmonary embolism) 2007   x2   Peripheral vascular disease ([place])    DVT Rt leg   Pneumonia 1990s   "walking" pneumonia   Prothrombin gene mutation ([place]) 05/30/2013   Restless legs    Right leg DVT ([place]) 05/30/2013   Stroke ([place])    found on a MRI, she's not aware otherwise    Past Surgical History:  Procedure Laterality Date   BACK SURGERY     CARDIAC CATHETERIZATION Bilateral 2005   COLONOSCOPY     IVC FILTER INSERTION N/A 03/12/2020   Procedure: IVC FILTER INSERTION;  Surgeon: Waynetta Sandy, MD;  Location: [place] [facility];  Service: Cardiovascular;  Laterality: N/A;   IVC FILTER REMOVAL N/A 06/11/2020   Procedure: IVC FILTER REMOVAL;  Surgeon: Waynetta Sandy, MD;  Location: [place] [facility];  Service: Cardiovascular;  Laterality: N/A;   REVERSE SHOULDER ARTHROPLASTY Right 03/07/2021    Procedure: REVERSE SHOULDER ARTHROPLASTY;  Surgeon: Justice Britain, MD;  [facility]: [facility];  Service: Orthopedics;  Laterality: Right;  126mn   RIGHT/LEFT HEART CATH AND CORONARY ANGIOGRAPHY N/A 07/12/2019   Procedure: RIGHT/LEFT HEART CATH AND CORONARY ANGIOGRAPHY;  Surgeon: KTroy Sine MD;  [facility];  Service: Cardiovascular;  Laterality: N/A;   SHOULDER SURGERY Bilateral    TOTAL KNEE ARTHROPLASTY Right 03/16/2020   Procedure: TOTAL KNEE ARTHROPLASTY;  Surgeon: BSusa Day MD;  [facility];  Service: Orthopedics;  Laterality: Right;  2.5 hrs    There were no vitals filed for this visit.   Subjective Assessment - 04/23/21 1032     Subjective Patient reports that her shoulder feels alright today. However, it still hurts quite a bit with certain movements.    Pertinent History history of back surgery (July 2018), CHF, HTN, history of prior right shoulder surgery    Limitations House hold activities    Patient Stated Goals be able to use her right arm,    Currently in Pain? No/denies    Pain Onset 1 to 4 weeks ago                [facility] 0001  AROM   Right Shoulder Flexion 99 Degrees   103 degrees after therapy today   Right Shoulder ABduction 96 Degrees   96 degrees after therapy today                          [facility] Adult PT Treatment/Exercise - 04/23/21 0001       Shoulder Exercises: Seated   Extension Right;20 reps;Theraband   to neutral   Theraband Level (Shoulder Extension) Level 3 (Green)    External Rotation AAROM;Right      Shoulder Exercises: Pulleys   Flexion 3 minutes   123 degrees of AAROM   ABduction 3 minutes      Shoulder Exercises: ROM/Strengthening   Nustep Lvl 5 x 15 mins                          PT Long Term Goals - 04/23/21 1132       PT LONG TERM GOAL #1   Title Patient will be independent with her HEP.    Time 6    Period Weeks    Status  On-going    Target Date 05/13/21      PT LONG TERM GOAL #2   Title Patient will be able to demonstrate at least 110 degrees of active right shoulder flexion for improved function shopping for groceries.    Baseline 99 degrees    Time 6    Period Weeks    Status On-going    Target Date 05/13/21      PT LONG TERM GOAL #3   Title Patient will be able to demonstrate at least 110 degrees of active left shoulder abduction for improved function reaching for her dishes.    Baseline 96 degrees    Time 6    Period Weeks    Status On-going    Target Date 05/13/21      PT LONG TERM GOAL #4   Title Patient will report no more than 4/10 pain in her right shoulder with eating and dressing.    Baseline intermittent spikes of high pain    Time 6    Period Weeks    Status Partially Met    Target Date 05/13/21                   Plan - 04/23/21 1039     Clinical Impression Statement Patient was progressed with active assisted interventions for improved shoulder mobility. She was able to demonstrate a significant improvement in flexion and abduction AROM since her initial evaluation as she was able to demonstrate 99 degrees of shoulder flexion and 96 degrees of abduction. She was also able to demonstrate 123 degrees of active assisted shoulder flexion with the pulleys. She required minimal cuing with active assisted external rotation to prevent shoulder abduction to isolate glenohumeral external rotation. She reported that her shoulder felt a little sore, but not bad upon the conclusion of treatment. She continues to require skilled physical therapy to address her remaining impairments to return to her prior level of function.    Personal Factors and Comorbidities Comorbidity 1;Past/Current Experience;Comorbidity 2;Comorbidity 3+    Comorbidities history of back surgery (July 2018), CHF, HTN, history of prior right shoulder surgery    Examination-Activity Limitations Bathing;Reach Overhead;Self  Feeding;Carry;Sleep;Dressing;Hygiene/Grooming    Examination-Participation Restrictions Other    Stability/Clinical Decision Making Evolving/Moderate complexity    Rehab Potential Good    PT Frequency  2x / week    PT Duration 6 weeks    PT Treatment/Interventions ADLs/Self Care Home Management;Electrical Stimulation;Ultrasound;Neuromuscular re-education;Therapeutic exercise;Therapeutic activities;Patient/family education;Manual techniques;Passive range of motion;Vasopneumatic Device    PT Next Visit Plan pendulums, nustep, PROM, vasopneumatic as needed    Consulted and Agree with Plan of Care Patient             Patient will benefit from skilled therapeutic intervention in order to improve the following deficits and impairments:  Decreased range of motion, Impaired UE functional use, Decreased activity tolerance, Pain, Decreased strength  Visit Diagnosis: Stiffness of right shoulder, not elsewhere classified  Acute pain of right shoulder     Problem List Patient Active Problem List   Diagnosis Date Noted   S/P TKR (total knee replacement) using cement 03/16/2020   Hyperlipidemia 01/31/2020   AKI (acute kidney injury) ([place]) 09/15/2019   Right flank pain 09/15/2019   Anemia 09/15/2019   Abnormal nuclear cardiac imaging test    CHF (congestive heart failure) ([place]) 07/09/2019   Dyspnea 07/09/2019   Respiratory failure with hypoxia ([place]) 07/09/2019   Shoulder pain 07/09/2019   Dyspnea on exertion 06/03/2019   Bilateral lower extremity edema 06/03/2019   Left bundle branch block 06/03/2019   Iron deficiency anemia due to chronic blood loss 03/10/2017   Postoperative wound dehiscence 12/02/2016   E. coli UTI    Abnormal urinalysis    Medication side effect    Neuropathic pain    History of DVT (deep vein thrombosis)    HTN (hypertension)    RLS (restless legs syndrome)    Urinary retention    Hypokalemia    Hypoalbuminemia due to protein-calorie malnutrition (HCC)     Acute blood loss anemia    Lumbar stenosis with neurogenic claudication 11/18/2016   Spondylolisthesis of lumbar region 11/12/2016   Hereditary hemochromatosis ([place]) 07/28/2016   Prothrombin gene mutation ([place]) 05/30/2013   Right leg DVT ([place]) 05/30/2013   Hemochromatosis 07/29/2012    Darlin Coco, PT 04/23/2021, 12:22 PM  [facility] [address] [place], [place], [address] Phone: [phone]   Fax:  [phone]  Name: [name] MRN: [id number] Date of Birth: [birth year]  Progress Note Reporting Period 04/01/21 to 04/23/21  See note below for Objective Data and Assessment of Progress/Goals.   Patient is making good progress toward her goals. See clinical impression statement.

## 2021-04-25 ENCOUNTER — Other Ambulatory Visit: Payer: Self-pay

## 2021-04-25 ENCOUNTER — Ambulatory Visit: Payer: PPO | Admitting: Physical Therapy

## 2021-04-25 ENCOUNTER — Encounter: Payer: Self-pay | Admitting: Physical Therapy

## 2021-04-25 DIAGNOSIS — M25611 Stiffness of right shoulder, not elsewhere classified: Secondary | ICD-10-CM | POA: Diagnosis not present

## 2021-04-25 DIAGNOSIS — I5043 Acute on chronic combined systolic (congestive) and diastolic (congestive) heart failure: Secondary | ICD-10-CM | POA: Diagnosis not present

## 2021-04-25 DIAGNOSIS — M25511 Pain in right shoulder: Secondary | ICD-10-CM

## 2021-04-25 NOTE — Therapy (Signed)
[facility] [place], [place], [address] Phone: [phone]   Fax:  [phone]  Physical Therapy Treatment  Patient Details  Name: [name] MRN: [id number] Date of Birth: June 20, 1940 Referring Provider (PT): Supple   Encounter Date: 04/25/2021   PT End of Session - 04/25/21 1157     Visit Number 8    Number of Visits 12    Date for PT Re-Evaluation 06/21/21    PT Start Time 1030    PT Stop Time 1118    PT Time Calculation (min) 48 min    Activity Tolerance Patient tolerated treatment well    Behavior During Therapy [facility] for tasks assessed/performed             Past Medical History:  Diagnosis Date   Anemia    Arthritis    osteoarthritis Shoulder,neck   Bell's palsy 1980s   CHF (congestive heart failure) ([place]) 06/2019   Chronic kidney disease    Stage III   Dyspnea 06/2019   On exertion   GERD (gastroesophageal reflux disease)    Hemochromatosis    History of hiatal hernia    Hypertension    Iron deficiency anemia due to chronic blood loss 03/10/2017   PE (pulmonary embolism) 2007   x2   Peripheral vascular disease ([place])    DVT Rt leg   Pneumonia 1990s   "walking" pneumonia   Prothrombin gene mutation ([place]) 05/30/2013   Restless legs    Right leg DVT ([place]) 05/30/2013   Stroke ([place])    found on a MRI, she's not aware otherwise    Past Surgical History:  Procedure Laterality Date   BACK SURGERY     CARDIAC CATHETERIZATION Bilateral 2005   COLONOSCOPY     IVC FILTER INSERTION N/A 03/12/2020   Procedure: IVC FILTER INSERTION;  Surgeon: Waynetta Sandy, MD;  Location: [facility];  Service: Cardiovascular;  Laterality: N/A;   IVC FILTER REMOVAL N/A 06/11/2020   Procedure: IVC FILTER REMOVAL;  Surgeon: Waynetta Sandy, MD;  Location: [place] [facility];  Service: Cardiovascular;  Laterality: N/A;   REVERSE SHOULDER ARTHROPLASTY Right 03/07/2021   Procedure: REVERSE  SHOULDER ARTHROPLASTY;  Surgeon: Justice Britain, MD;  [facility];  Service: Orthopedics;  Laterality: Right;  161mn   RIGHT/LEFT HEART CATH AND CORONARY ANGIOGRAPHY N/A 07/12/2019   Procedure: RIGHT/LEFT HEART CATH AND CORONARY ANGIOGRAPHY;  Surgeon: KTroy Sine MD;  [facility];  Service: Cardiovascular;  Laterality: N/A;   SHOULDER SURGERY Bilateral    TOTAL KNEE ARTHROPLASTY Right 03/16/2020   Procedure: TOTAL KNEE ARTHROPLASTY;  Surgeon: BSusa Day MD;  [facility];  Service: Orthopedics;  Laterality: Right;  2.5 hrs    There were no vitals filed for this visit.   Subjective Assessment - 04/25/21 1044     Subjective COVID-19 screen performed prior to patient entering clinic.  Hurting quite a bit.    Pertinent History history of back surgery (July 2018), CHF, HTN, history of prior right shoulder surgery    Limitations House hold activities    Patient Stated Goals be able to use her right arm,    Currently in Pain? Yes    Pain Score 6     Pain Orientation Right    Pain Descriptors / Indicators Discomfort    Pain Type Surgical pain    Pain Onset More than a month ago    Pain Frequency Intermittent  [facility] Adult PT Treatment/Exercise - 04/25/21 0001       Exercises   Exercises Shoulder      Knee/Hip Exercises: Aerobic   Nustep Level 1 for UE range of motion x 10 minutes.      Shoulder Exercises: Seated   Other Seated Exercises Seated UE Ranger x 3 minutes.    Other Seated Exercises Wall ladder x 3 minutes.      Shoulder Exercises: Pulleys   Flexion 5 minutes      Electrical Stimulation   Electrical Stimulation Location Right shoulder    Electrical Stimulation Action IFC at 80-150 Hz.    Electrical Stimulation Parameters 40% scan x 15 minutes.    Electrical Stimulation Goals Pain      Vasopneumatic   Number Minutes Vasopneumatic  15 minutes    Vasopnuematic Location  --   Right  shoulder wiht pillow between thorax   Vasopneumatic Pressure Low      Manual Therapy   Passive ROM Gentle left shoulder PROM into flexion and ER tolerated only 2 minutes                          PT Long Term Goals - 04/23/21 1132       PT LONG TERM GOAL #1   Title Patient will be independent with her HEP.    Time 6    Period Weeks    Status On-going    Target Date 05/13/21      PT LONG TERM GOAL #2   Title Patient will be able to demonstrate at least 110 degrees of active right shoulder flexion for improved function shopping for groceries.    Baseline 99 degrees    Time 6    Period Weeks    Status On-going    Target Date 05/13/21      PT LONG TERM GOAL #3   Title Patient will be able to demonstrate at least 110 degrees of active left shoulder abduction for improved function reaching for her dishes.    Baseline 96 degrees    Time 6    Period Weeks    Status On-going    Target Date 05/13/21      PT LONG TERM GOAL #4   Title Patient will report no more than 4/10 pain in her right shoulder with eating and dressing.    Baseline intermittent spikes of high pain    Time 6    Period Weeks    Status Partially Met    Target Date 05/13/21                   Plan - 04/25/21 1112     Clinical Impression Statement Patient feeling increased pain today with activity.  Limited passive range of motion due to pain.  Performed electrical stimulation and vasopneumatic for pain control.  Nornal modality response following removal of modality.    Personal Factors and Comorbidities Comorbidity 1;Past/Current Experience;Comorbidity 2;Comorbidity 3+    Comorbidities history of back surgery (July 2018), CHF, HTN, history of prior right shoulder surgery    Examination-Activity Limitations Bathing;Reach Overhead;Self Feeding;Carry;Sleep;Dressing;Hygiene/Grooming    Examination-Participation Restrictions Other    Stability/Clinical Decision Making Evolving/Moderate  complexity    Rehab Potential Good    PT Frequency 2x / week    PT Duration 6 weeks    PT Treatment/Interventions ADLs/Self Care Home Management;Electrical Stimulation;Ultrasound;Neuromuscular re-education;Therapeutic exercise;Therapeutic activities;Patient/family education;Manual techniques;Passive range of motion;Vasopneumatic Device    PT Next Visit  Plan pendulums, nustep, PROM, vasopneumatic as needed             Patient will benefit from skilled therapeutic intervention in order to improve the following deficits and impairments:  Decreased range of motion, Impaired UE functional use, Decreased activity tolerance, Pain, Decreased strength  Visit Diagnosis: Stiffness of right shoulder, not elsewhere classified  Acute pain of right shoulder     Problem List Patient Active Problem List   Diagnosis Date Noted   S/P TKR (total knee replacement) using cement 03/16/2020   Hyperlipidemia 01/31/2020   AKI (acute kidney injury) ([place]) 09/15/2019   Right flank pain 09/15/2019   Anemia 09/15/2019   Abnormal nuclear cardiac imaging test    CHF (congestive heart failure) ([place]) 07/09/2019   Dyspnea 07/09/2019   Respiratory failure with hypoxia ([place]) 07/09/2019   Shoulder pain 07/09/2019   Dyspnea on exertion 06/03/2019   Bilateral lower extremity edema 06/03/2019   Left bundle branch block 06/03/2019   Iron deficiency anemia due to chronic blood loss 03/10/2017   Postoperative wound dehiscence 12/02/2016   E. coli UTI    Abnormal urinalysis    Medication side effect    Neuropathic pain    History of DVT (deep vein thrombosis)    HTN (hypertension)    RLS (restless legs syndrome)    Urinary retention    Hypokalemia    Hypoalbuminemia due to protein-calorie malnutrition (HCC)    Acute blood loss anemia    Lumbar stenosis with neurogenic claudication 11/18/2016   Spondylolisthesis of lumbar region 11/12/2016   Hereditary hemochromatosis ([place]) 07/28/2016   Prothrombin gene  mutation ([place]) 05/30/2013   Right leg DVT ([place]) 05/30/2013   Hemochromatosis 07/29/2012    Shakedra Beam, [place], PT 04/25/2021, 11:58 AM  [facility] [address] [place], [place], [address] Phone: [phone]   Fax:  [phone]  Name: [name] MRN: [id number] Date of Birth: Feb 25, 1941

## 2021-04-30 ENCOUNTER — Ambulatory Visit: Payer: PPO | Admitting: *Deleted

## 2021-05-02 ENCOUNTER — Encounter: Payer: PPO | Admitting: Physical Therapy

## 2021-05-02 DIAGNOSIS — M19032 Primary osteoarthritis, left wrist: Secondary | ICD-10-CM | POA: Diagnosis not present

## 2021-05-02 DIAGNOSIS — M47812 Spondylosis without myelopathy or radiculopathy, cervical region: Secondary | ICD-10-CM | POA: Diagnosis not present

## 2021-05-02 DIAGNOSIS — Z9889 Other specified postprocedural states: Secondary | ICD-10-CM | POA: Diagnosis not present

## 2021-05-02 DIAGNOSIS — I509 Heart failure, unspecified: Secondary | ICD-10-CM | POA: Diagnosis not present

## 2021-05-02 DIAGNOSIS — F3340 Major depressive disorder, recurrent, in remission, unspecified: Secondary | ICD-10-CM | POA: Diagnosis not present

## 2021-05-02 DIAGNOSIS — Z8739 Personal history of other diseases of the musculoskeletal system and connective tissue: Secondary | ICD-10-CM | POA: Diagnosis not present

## 2021-05-02 DIAGNOSIS — I110 Hypertensive heart disease with heart failure: Secondary | ICD-10-CM | POA: Diagnosis not present

## 2021-05-02 DIAGNOSIS — M19049 Primary osteoarthritis, unspecified hand: Secondary | ICD-10-CM | POA: Diagnosis not present

## 2021-05-02 DIAGNOSIS — M19031 Primary osteoarthritis, right wrist: Secondary | ICD-10-CM | POA: Diagnosis not present

## 2021-05-02 DIAGNOSIS — M1A9XX0 Chronic gout, unspecified, without tophus (tophi): Secondary | ICD-10-CM | POA: Diagnosis not present

## 2021-05-06 ENCOUNTER — Ambulatory Visit: Payer: PPO

## 2021-05-06 DIAGNOSIS — K219 Gastro-esophageal reflux disease without esophagitis: Secondary | ICD-10-CM | POA: Diagnosis not present

## 2021-05-06 DIAGNOSIS — N183 Chronic kidney disease, stage 3 unspecified: Secondary | ICD-10-CM | POA: Diagnosis not present

## 2021-05-06 DIAGNOSIS — G250 Essential tremor: Secondary | ICD-10-CM | POA: Diagnosis not present

## 2021-05-06 DIAGNOSIS — G259 Extrapyramidal and movement disorder, unspecified: Secondary | ICD-10-CM | POA: Diagnosis not present

## 2021-05-06 DIAGNOSIS — E7800 Pure hypercholesterolemia, unspecified: Secondary | ICD-10-CM | POA: Diagnosis not present

## 2021-05-06 DIAGNOSIS — F329 Major depressive disorder, single episode, unspecified: Secondary | ICD-10-CM | POA: Diagnosis not present

## 2021-05-06 DIAGNOSIS — D508 Other iron deficiency anemias: Secondary | ICD-10-CM | POA: Diagnosis not present

## 2021-05-06 DIAGNOSIS — I10 Essential (primary) hypertension: Secondary | ICD-10-CM | POA: Diagnosis not present

## 2021-05-08 ENCOUNTER — Encounter: Payer: PPO | Admitting: Physical Therapy

## 2021-05-08 ENCOUNTER — Encounter: Payer: Self-pay | Admitting: Hematology & Oncology

## 2021-05-13 DIAGNOSIS — Z8616 Personal history of COVID-19: Secondary | ICD-10-CM | POA: Diagnosis not present

## 2021-05-13 DIAGNOSIS — L299 Pruritus, unspecified: Secondary | ICD-10-CM | POA: Diagnosis not present

## 2021-05-13 DIAGNOSIS — R21 Rash and other nonspecific skin eruption: Secondary | ICD-10-CM | POA: Diagnosis not present

## 2021-05-14 DIAGNOSIS — M8580 Other specified disorders of bone density and structure, unspecified site: Secondary | ICD-10-CM | POA: Diagnosis not present

## 2021-05-14 DIAGNOSIS — E7800 Pure hypercholesterolemia, unspecified: Secondary | ICD-10-CM | POA: Diagnosis not present

## 2021-05-14 DIAGNOSIS — L299 Pruritus, unspecified: Secondary | ICD-10-CM | POA: Diagnosis not present

## 2021-05-14 DIAGNOSIS — N183 Chronic kidney disease, stage 3 unspecified: Secondary | ICD-10-CM | POA: Diagnosis not present

## 2021-05-14 DIAGNOSIS — I5043 Acute on chronic combined systolic (congestive) and diastolic (congestive) heart failure: Secondary | ICD-10-CM | POA: Diagnosis not present

## 2021-05-14 DIAGNOSIS — I10 Essential (primary) hypertension: Secondary | ICD-10-CM | POA: Diagnosis not present

## 2021-05-14 DIAGNOSIS — G4700 Insomnia, unspecified: Secondary | ICD-10-CM | POA: Diagnosis not present

## 2021-05-14 DIAGNOSIS — K219 Gastro-esophageal reflux disease without esophagitis: Secondary | ICD-10-CM | POA: Diagnosis not present

## 2021-05-14 DIAGNOSIS — F329 Major depressive disorder, single episode, unspecified: Secondary | ICD-10-CM | POA: Diagnosis not present

## 2021-05-14 DIAGNOSIS — N159 Renal tubulo-interstitial disease, unspecified: Secondary | ICD-10-CM | POA: Diagnosis not present

## 2021-05-14 DIAGNOSIS — M19041 Primary osteoarthritis, right hand: Secondary | ICD-10-CM | POA: Diagnosis not present

## 2021-05-14 DIAGNOSIS — D508 Other iron deficiency anemias: Secondary | ICD-10-CM | POA: Diagnosis not present

## 2021-05-20 ENCOUNTER — Other Ambulatory Visit: Payer: PPO

## 2021-05-20 ENCOUNTER — Other Ambulatory Visit: Payer: Self-pay

## 2021-05-20 ENCOUNTER — Ambulatory Visit: Payer: PPO | Admitting: Family

## 2021-05-20 ENCOUNTER — Ambulatory Visit: Payer: PPO | Attending: Orthopedic Surgery

## 2021-05-20 DIAGNOSIS — M25611 Stiffness of right shoulder, not elsewhere classified: Secondary | ICD-10-CM | POA: Diagnosis not present

## 2021-05-20 DIAGNOSIS — M25511 Pain in right shoulder: Secondary | ICD-10-CM | POA: Diagnosis not present

## 2021-05-20 DIAGNOSIS — L299 Pruritus, unspecified: Secondary | ICD-10-CM | POA: Diagnosis not present

## 2021-05-20 DIAGNOSIS — Z8616 Personal history of COVID-19: Secondary | ICD-10-CM | POA: Diagnosis not present

## 2021-05-20 NOTE — Therapy (Signed)
[facility] [place], [place], [address] Phone: [phone]   Fax:  [phone]  Physical Therapy Treatment  Patient Details  Name: [name] MRN: [id number] Date of Birth: 12-29-1940 Referring Provider (PT): Supple   Encounter Date: 05/20/2021   PT End of Session - 05/20/21 1039     Visit Number 9    Number of Visits 12    Date for PT Re-Evaluation 06/21/21    PT Start Time 1031    PT Stop Time 1111    PT Time Calculation (min) 40 min    Activity Tolerance Patient tolerated treatment well    Behavior During Therapy [facility] for tasks assessed/performed             Past Medical History:  Diagnosis Date   Anemia    Arthritis    osteoarthritis Shoulder,neck   Bell's palsy 1980s   CHF (congestive heart failure) ([place]) 06/2019   Chronic kidney disease    Stage III   Dyspnea 06/2019   On exertion   GERD (gastroesophageal reflux disease)    Hemochromatosis    History of hiatal hernia    Hypertension    Iron deficiency anemia due to chronic blood loss 03/10/2017   PE (pulmonary embolism) 2007   x2   Peripheral vascular disease ([place])    DVT Rt leg   Pneumonia 1990s   "walking" pneumonia   Prothrombin gene mutation ([place]) 05/30/2013   Restless legs    Right leg DVT ([place]) 05/30/2013   Stroke ([place])    found on a MRI, she's not aware otherwise    Past Surgical History:  Procedure Laterality Date   BACK SURGERY     CARDIAC CATHETERIZATION Bilateral 2005   COLONOSCOPY     IVC FILTER INSERTION N/A 03/12/2020   Procedure: IVC FILTER INSERTION;  Surgeon: Waynetta Sandy, MD;  Location: [place] [facility];  Service: Cardiovascular;  Laterality: N/A;   IVC FILTER REMOVAL N/A 06/11/2020   Procedure: IVC FILTER REMOVAL;  Surgeon: Waynetta Sandy, MD;  Location: [place] [facility];  Service: Cardiovascular;  Laterality: N/A;   REVERSE SHOULDER ARTHROPLASTY Right 03/07/2021   Procedure: REVERSE  SHOULDER ARTHROPLASTY;  Surgeon: Justice Britain, MD;  Location: [facility];  Service: Orthopedics;  Laterality: Right;  164mn   RIGHT/LEFT HEART CATH AND CORONARY ANGIOGRAPHY N/A 07/12/2019   Procedure: RIGHT/LEFT HEART CATH AND CORONARY ANGIOGRAPHY;  Surgeon: KTroy Sine MD;  [facility];  Service: Cardiovascular;  Laterality: N/A;   SHOULDER SURGERY Bilateral    TOTAL KNEE ARTHROPLASTY Right 03/16/2020   Procedure: TOTAL KNEE ARTHROPLASTY;  Surgeon: BSusa Day MD;  [facility]: [facility];  Service: Orthopedics;  Laterality: Right;  2.5 hrs    There were no vitals filed for this visit.   Subjective Assessment - 05/20/21 1032     Subjective COVID-19 screen performed prior to patient entering clinic. Pt denies any pain upon arriving for today's treatment session.  Pt reports that shoulder pain can get up to 36/72with certain movements.    Pertinent History history of back surgery (July 2018), CHF, HTN, history of prior right shoulder surgery    Limitations House hold activities    Patient Stated Goals be able to use her right arm,    Currently in Pain? No/denies    Pain Onset More than a month ago  [facility] Adult PT Treatment/Exercise - 05/20/21 0001       Shoulder Exercises: Seated   Extension Strengthening;Both;20 reps;Theraband    Theraband Level (Shoulder Extension) Level 1 (Yellow)    Row Both;20 reps;Theraband    Theraband Level (Shoulder Row) Level 1 (Yellow)    Horizontal ABduction Strengthening;Both;20 reps;Theraband    Theraband Level (Shoulder Horizontal ABduction) Level 1 (Yellow)    External Rotation Strengthening;Both;20 reps;Theraband    Theraband Level (Shoulder External Rotation) Level 1 (Yellow)    External Rotation Limitations Cues for technique      Shoulder Exercises: Pulleys   Flexion 5 minutes      Shoulder Exercises: ROM/Strengthening   Nustep Lvl 5 x 15 mins    [place]anger Flexion/extension x 2  mins, CW/CCW circles x 2 mins each                          PT Long Term Goals - 04/23/21 1132       PT LONG TERM GOAL #1   Title Patient will be independent with her HEP.    Time 6    Period Weeks    Status On-going    Target Date 05/13/21      PT LONG TERM GOAL #2   Title Patient will be able to demonstrate at least 110 degrees of active right shoulder flexion for improved function shopping for groceries.    Baseline 99 degrees    Time 6    Period Weeks    Status On-going    Target Date 05/13/21      PT LONG TERM GOAL #3   Title Patient will be able to demonstrate at least 110 degrees of active left shoulder abduction for improved function reaching for her dishes.    Baseline 96 degrees    Time 6    Period Weeks    Status On-going    Target Date 05/13/21      PT LONG TERM GOAL #4   Title Patient will report no more than 4/10 pain in her right shoulder with eating and dressing.    Baseline intermittent spikes of high pain    Time 6    Period Weeks    Status Partially Met    Target Date 05/13/21                   Plan - 05/20/21 1039     Clinical Impression Statement Pt arrivves for today's treatment session denying any pain.  Pt states that pain can get up to 4/09 with certain movements.  Pt instructed in seated tband shoulder exercises without increase in pain.  Pt requiring min cues for technique with all newly added exercises especially internal and external rotation.  Pt given tband and printed HEP for all tband exercises.  Pt denied any pain at completion of today's treatment session.    Personal Factors and Comorbidities Comorbidity 1;Past/Current Experience;Comorbidity 2;Comorbidity 3+    Comorbidities history of back surgery (July 2018), CHF, HTN, history of prior right shoulder surgery    Examination-Activity Limitations Bathing;Reach Overhead;Self Feeding;Carry;Sleep;Dressing;Hygiene/Grooming    Examination-Participation Restrictions  Other    Stability/Clinical Decision Making Evolving/Moderate complexity    Rehab Potential Good    PT Frequency 2x / week    PT Duration 6 weeks    PT Treatment/Interventions ADLs/Self Care Home Management;Electrical Stimulation;Ultrasound;Neuromuscular re-education;Therapeutic exercise;Therapeutic activities;Patient/family education;Manual techniques;Passive range of motion;Vasopneumatic Device    PT Next Visit Plan pendulums, nustep, PROM, vasopneumatic as  needed             Patient will benefit from skilled therapeutic intervention in order to improve the following deficits and impairments:  Decreased range of motion, Impaired UE functional use, Decreased activity tolerance, Pain, Decreased strength  Visit Diagnosis: Stiffness of right shoulder, not elsewhere classified  Acute pain of right shoulder     Problem List Patient Active Problem List   Diagnosis Date Noted   S/P TKR (total knee replacement) using cement 03/16/2020   Hyperlipidemia 01/31/2020   AKI (acute kidney injury) ([place]) 09/15/2019   Right flank pain 09/15/2019   Anemia 09/15/2019   Abnormal nuclear cardiac imaging test    CHF (congestive heart failure) ([place]) 07/09/2019   Dyspnea 07/09/2019   Respiratory failure with hypoxia ([place]) 07/09/2019   Shoulder pain 07/09/2019   Dyspnea on exertion 06/03/2019   Bilateral lower extremity edema 06/03/2019   Left bundle branch block 06/03/2019   Iron deficiency anemia due to chronic blood loss 03/10/2017   Postoperative wound dehiscence 12/02/2016   E. coli UTI    Abnormal urinalysis    Medication side effect    Neuropathic pain    History of DVT (deep vein thrombosis)    HTN (hypertension)    RLS (restless legs syndrome)    Urinary retention    Hypokalemia    Hypoalbuminemia due to protein-calorie malnutrition (HCC)    Acute blood loss anemia    Lumbar stenosis with neurogenic claudication 11/18/2016   Spondylolisthesis of lumbar region 11/12/2016    Hereditary hemochromatosis ([place]) 07/28/2016   Prothrombin gene mutation ([place]) 05/30/2013   Right leg DVT ([place]) 05/30/2013   Hemochromatosis 07/29/2012    Kathrynn Ducking, PTA 05/20/2021, 11:16 AM  [facility] [address] [place], [place], [address] Phone: [phone]   Fax:  [phone]  Name: [name] MRN: [id number] Date of Birth: Sep 16, 1940

## 2021-05-22 ENCOUNTER — Ambulatory Visit: Payer: PPO

## 2021-05-22 ENCOUNTER — Other Ambulatory Visit: Payer: Self-pay

## 2021-05-22 DIAGNOSIS — E669 Obesity, unspecified: Secondary | ICD-10-CM | POA: Diagnosis not present

## 2021-05-22 DIAGNOSIS — M1120 Other chondrocalcinosis, unspecified site: Secondary | ICD-10-CM | POA: Diagnosis not present

## 2021-05-22 DIAGNOSIS — M2550 Pain in unspecified joint: Secondary | ICD-10-CM | POA: Diagnosis not present

## 2021-05-22 DIAGNOSIS — M25511 Pain in right shoulder: Secondary | ICD-10-CM

## 2021-05-22 DIAGNOSIS — M150 Primary generalized (osteo)arthritis: Secondary | ICD-10-CM | POA: Diagnosis not present

## 2021-05-22 DIAGNOSIS — M25611 Stiffness of right shoulder, not elsewhere classified: Secondary | ICD-10-CM | POA: Diagnosis not present

## 2021-05-22 DIAGNOSIS — Z6831 Body mass index (BMI) 31.0-31.9, adult: Secondary | ICD-10-CM | POA: Diagnosis not present

## 2021-05-22 DIAGNOSIS — M1009 Idiopathic gout, multiple sites: Secondary | ICD-10-CM | POA: Diagnosis not present

## 2021-05-22 NOTE — Therapy (Addendum)
[facility] [place], [place], [address] Phone: [phone]   Fax:  [phone]  Physical Therapy Treatment  Patient Details  Name: [name] MRN: [id number] Date of Birth: 05/26/1940 Referring Provider (PT): Supple   Encounter Date: 05/22/2021   PT End of Session - 05/22/21 1040     Visit Number 10    Number of Visits 12    Date for PT Re-Evaluation 06/21/21    PT Start Time 1032    PT Stop Time 1113    PT Time Calculation (min) 41 min    Activity Tolerance Patient tolerated treatment well    Behavior During Therapy [facility] for tasks assessed/performed             Past Medical History:  Diagnosis Date   Anemia    Arthritis    osteoarthritis Shoulder,neck   Bell's palsy 1980s   CHF (congestive heart failure) ([place]) 06/2019   Chronic kidney disease    Stage III   Dyspnea 06/2019   On exertion   GERD (gastroesophageal reflux disease)    Hemochromatosis    History of hiatal hernia    Hypertension    Iron deficiency anemia due to chronic blood loss 03/10/2017   PE (pulmonary embolism) 2007   x2   Peripheral vascular disease ([place])    DVT Rt leg   Pneumonia 1990s   "walking" pneumonia   Prothrombin gene mutation ([place]) 05/30/2013   Restless legs    Right leg DVT ([place]) 05/30/2013   Stroke ([place])    found on a MRI, she's not aware otherwise    Past Surgical History:  Procedure Laterality Date   BACK SURGERY     CARDIAC CATHETERIZATION Bilateral 2005   COLONOSCOPY     IVC FILTER INSERTION N/A 03/12/2020   Procedure: IVC FILTER INSERTION;  Surgeon: Waynetta Sandy, MD;  Location: [place] [facility];  Service: Cardiovascular;  Laterality: N/A;   IVC FILTER REMOVAL N/A 06/11/2020   Procedure: IVC FILTER REMOVAL;  Surgeon: Waynetta Sandy, MD;  Location: [place] [facility];  Service: Cardiovascular;  Laterality: N/A;   REVERSE SHOULDER ARTHROPLASTY Right 03/07/2021   Procedure: REVERSE  SHOULDER ARTHROPLASTY;  Surgeon: Justice Britain, MD;  [facility];  Service: Orthopedics;  Laterality: Right;  154min   RIGHT/LEFT HEART CATH AND CORONARY ANGIOGRAPHY N/A 07/12/2019   Procedure: RIGHT/LEFT HEART CATH AND CORONARY ANGIOGRAPHY;  Surgeon: Troy Sine, MD;  Location: [place] [facility];  Service: Cardiovascular;  Laterality: N/A;   SHOULDER SURGERY Bilateral    TOTAL KNEE ARTHROPLASTY Right 03/16/2020   Procedure: TOTAL KNEE ARTHROPLASTY;  Surgeon: Susa Day, MD;  [facility]: [facility];  Service: Orthopedics;  Laterality: Right;  2.5 hrs    There were no vitals filed for this visit.   Subjective Assessment - 05/22/21 1039     Subjective COVID-19 screen performed prior to patient entering clinic. Pt denies any pain upon arriving for today's treatment session.  Pt reports that her shoulder was sore yesterday morning, but feels better today.    Pertinent History history of back surgery (July 2018), CHF, HTN, history of prior right shoulder surgery    Limitations House hold activities    Patient Stated Goals be able to use her right arm,    Currently in Pain? No/denies    Pain Onset More than a month ago                [facility] PT Assessment -  05/22/21 0001       Observation/Other Assessments   Focus on Therapeutic Outcomes (FOTO)  63   10th visit; 05/22/21                          [facility] Adult PT Treatment/Exercise - 05/22/21 0001       Shoulder Exercises: Seated   Extension Strengthening;Both;20 reps;Theraband    Theraband Level (Shoulder Extension) Level 1 (Yellow)    Row Both;20 reps;Theraband    Theraband Level (Shoulder Row) Level 1 (Yellow)    Horizontal ABduction Strengthening;Both;20 reps;Theraband    Theraband Level (Shoulder Horizontal ABduction) Level 1 (Yellow)    External Rotation Strengthening;Both;20 reps;Theraband    Theraband Level (Shoulder External Rotation) Level 1 (Yellow)    External Rotation Limitations Cues for  technique    Flexion Strengthening;20 reps;Right    Theraband Level (Shoulder Flexion) --      Shoulder Exercises: Pulleys   Flexion 5 minutes      Shoulder Exercises: ROM/Strengthening   Nustep Lvl 5 x 15 mins    [place] Flexion/extension x 2 mins, CW/CCW circles x 2 mins each                          PT Long Term Goals - 05/22/21 1041       PT LONG TERM GOAL #1   Title Patient will be independent with her HEP.    Time 6    Period Weeks    Status Achieved    Target Date 05/13/21      PT LONG TERM GOAL #2   Title Patient will be able to demonstrate at least 110 degrees of active right shoulder flexion for improved function shopping for groceries.    Baseline 99 degrees; 05/22/21: 141 degrees of active flexion    Time 6    Period Weeks    Status Achieved    Target Date 05/13/21      PT LONG TERM GOAL #3   Title Patient will be able to demonstrate at least 110 degrees of active left shoulder abduction for improved function reaching for her dishes.    Baseline 96 degrees; 05/22/21: 104 degrees of active abduction    Time 6    Period Weeks    Status On-going    Target Date 05/13/21      PT LONG TERM GOAL #4   Title Patient will report no more than 4/10 pain in her right shoulder with eating and dressing.    Baseline intermittent spikes of high pain    Time 6    Period Weeks    Target Date 05/13/21                   Plan - 05/22/21 1041     Clinical Impression Statement Pt arrives for today's treatment session denying any pain.  Pt states that her shoulder was really sore yesterday when she woke up, but she is feeling much better today.  Pt able to demonstrate 141 degrees of active flexion and 104 degrees of active abduction.  Pt also able to increase her FOTO score to 63 during today's treatment session.  Pt instructed in seated resisted flexion with tband, but was unable to perform with tband.  Instructed to perform without resistance at this  point.  Pt denied any pain at completion of today's treatment session.    Personal Factors and Comorbidities Comorbidity 1;Past/Current Experience;Comorbidity 2;Comorbidity  3+    Comorbidities history of back surgery (July 2018), CHF, HTN, history of prior right shoulder surgery    Examination-Activity Limitations Bathing;Reach Overhead;Self Feeding;Carry;Sleep;Dressing;Hygiene/Grooming    Examination-Participation Restrictions Other    Stability/Clinical Decision Making Evolving/Moderate complexity    Rehab Potential Good    PT Frequency 2x / week    PT Duration 6 weeks    PT Treatment/Interventions ADLs/Self Care Home Management;Electrical Stimulation;Ultrasound;Neuromuscular re-education;Therapeutic exercise;Therapeutic activities;Patient/family education;Manual techniques;Passive range of motion;Vasopneumatic Device    PT Next Visit Plan pendulums, nustep, PROM, vasopneumatic as needed             Patient will benefit from skilled therapeutic intervention in order to improve the following deficits and impairments:  Decreased range of motion, Impaired UE functional use, Decreased activity tolerance, Pain, Decreased strength  Visit Diagnosis: Stiffness of right shoulder, not elsewhere classified  Acute pain of right shoulder     Problem List Patient Active Problem List   Diagnosis Date Noted   S/P TKR (total knee replacement) using cement 03/16/2020   Hyperlipidemia 01/31/2020   AKI (acute kidney injury) ([place]) 09/15/2019   Right flank pain 09/15/2019   Anemia 09/15/2019   Abnormal nuclear cardiac imaging test    CHF (congestive heart failure) ([place]) 07/09/2019   Dyspnea 07/09/2019   Respiratory failure with hypoxia ([place]) 07/09/2019   Shoulder pain 07/09/2019   Dyspnea on exertion 06/03/2019   Bilateral lower extremity edema 06/03/2019   Left bundle branch block 06/03/2019   Iron deficiency anemia due to chronic blood loss 03/10/2017   Postoperative wound dehiscence  12/02/2016   E. coli UTI    Abnormal urinalysis    Medication side effect    Neuropathic pain    History of DVT (deep vein thrombosis)    HTN (hypertension)    RLS (restless legs syndrome)    Urinary retention    Hypokalemia    Hypoalbuminemia due to protein-calorie malnutrition (HCC)    Acute blood loss anemia    Lumbar stenosis with neurogenic claudication 11/18/2016   Spondylolisthesis of lumbar region 11/12/2016   Hereditary hemochromatosis ([place]) 07/28/2016   Prothrombin gene mutation ([place]) 05/30/2013   Right leg DVT ([place]) 05/30/2013   Hemochromatosis 07/29/2012    Kathrynn Ducking, PTA 05/22/2021, 11:21 AM  [facility] [address] [place], [place], [address] Phone: [phone]   Fax:  [phone]  Name: ESTEFANI BATESON MRN: [id number] Date of Birth: [birth year]   Progress Note Reporting Period 04/01/21 to 05/22/21  See note below for Objective Data and Assessment of Progress/Goals. Excellent progress toward goals per protocl progression.    Mali Applegate MPT

## 2021-05-27 ENCOUNTER — Ambulatory Visit: Payer: PPO

## 2021-05-27 ENCOUNTER — Other Ambulatory Visit: Payer: Self-pay

## 2021-05-27 DIAGNOSIS — M25511 Pain in right shoulder: Secondary | ICD-10-CM

## 2021-05-27 DIAGNOSIS — M25611 Stiffness of right shoulder, not elsewhere classified: Secondary | ICD-10-CM

## 2021-05-27 NOTE — Therapy (Signed)
[facility] [place], [place], [address] Phone: [phone]   Fax:  [phone]  Physical Therapy Treatment  Patient Details  Name: [name] MRN: [id number] Date of Birth: 05/06/1940 Referring Provider (PT): Supple   Encounter Date: 05/27/2021   PT End of Session - 05/27/21 1123     Visit Number 11    Number of Visits 12    Date for PT Re-Evaluation 06/21/21    PT Start Time 1115    PT Stop Time 0737    PT Time Calculation (min) 43 min    Activity Tolerance Patient tolerated treatment well    Behavior During Therapy [facility] for tasks assessed/performed             Past Medical History:  Diagnosis Date   Anemia    Arthritis    osteoarthritis Shoulder,neck   Bell's palsy 1980s   CHF (congestive heart failure) ([place]) 06/2019   Chronic kidney disease    Stage III   Dyspnea 06/2019   On exertion   GERD (gastroesophageal reflux disease)    Hemochromatosis    History of hiatal hernia    Hypertension    Iron deficiency anemia due to chronic blood loss 03/10/2017   PE (pulmonary embolism) 2007   x2   Peripheral vascular disease ([place])    DVT Rt leg   Pneumonia 1990s   "walking" pneumonia   Prothrombin gene mutation ([place]) 05/30/2013   Restless legs    Right leg DVT ([place]) 05/30/2013   Stroke ([place])    found on a MRI, she's not aware otherwise    Past Surgical History:  Procedure Laterality Date   BACK SURGERY     CARDIAC CATHETERIZATION Bilateral 2005   COLONOSCOPY     IVC FILTER INSERTION N/A 03/12/2020   Procedure: IVC FILTER INSERTION;  Surgeon: Waynetta Sandy, MD;  Location: [place] [facility];  Service: Cardiovascular;  Laterality: N/A;   IVC FILTER REMOVAL N/A 06/11/2020   Procedure: IVC FILTER REMOVAL;  Surgeon: Waynetta Sandy, MD;  Location: [place] [facility];  Service: Cardiovascular;  Laterality: N/A;   REVERSE SHOULDER ARTHROPLASTY Right 03/07/2021   Procedure: REVERSE  SHOULDER ARTHROPLASTY;  Surgeon: Justice Britain, MD;  Location: [facility];  Service: Orthopedics;  Laterality: Right;  13min   RIGHT/LEFT HEART CATH AND CORONARY ANGIOGRAPHY N/A 07/12/2019   Procedure: RIGHT/LEFT HEART CATH AND CORONARY ANGIOGRAPHY;  Surgeon: Troy Sine, MD;  Location: [place] [facility];  Service: Cardiovascular;  Laterality: N/A;   SHOULDER SURGERY Bilateral    TOTAL KNEE ARTHROPLASTY Right 03/16/2020   Procedure: TOTAL KNEE ARTHROPLASTY;  Surgeon: Susa Day, MD;  Location: [facility];  Service: Orthopedics;  Laterality: Right;  2.5 hrs    There were no vitals filed for this visit.   Subjective Assessment - 05/27/21 1117     Subjective COVID-19 screen performed prior to patient entering clinic. Pt denies any pain upon arriving for today's treatment session.    Pertinent History history of back surgery (July 2018), CHF, HTN, history of prior right shoulder surgery    Limitations House hold activities    Patient Stated Goals be able to use her right arm,    Currently in Pain? No/denies    Pain Onset More than a month ago                               [facility] Adult PT  Treatment/Exercise - 05/27/21 0001       Shoulder Exercises: Seated   Extension Strengthening;Both;Theraband   25 reps   Theraband Level (Shoulder Extension) Level 1 (Yellow)    Row Both;Theraband   25 reps   Theraband Level (Shoulder Row) Level 1 (Yellow)    Horizontal ABduction Strengthening;Both;Theraband   25 reps   Theraband Level (Shoulder Horizontal ABduction) Level 1 (Yellow)    External Rotation Strengthening;Both;Theraband   25 reps   Theraband Level (Shoulder External Rotation) Level 1 (Yellow)    External Rotation Limitations Cues for technique    Internal Rotation Strengthening;Right;20 reps;Theraband    Theraband Level (Shoulder Internal Rotation) Level 1 (Yellow)    Flexion Strengthening;Right;Theraband   25 reps     Shoulder Exercises: Pulleys   Flexion 5 minutes       Shoulder Exercises: ROM/Strengthening   Nustep Lvl 5 x 15 mins    [place]anger Flexion/extension x 2 mins, CW/CCW circles x 2 mins each                          PT Long Term Goals - 05/22/21 1041       PT LONG TERM GOAL #1   Title Patient will be independent with her HEP.    Time 6    Period Weeks    Status Achieved    Target Date 05/13/21      PT LONG TERM GOAL #2   Title Patient will be able to demonstrate at least 110 degrees of active right shoulder flexion for improved function shopping for groceries.    Baseline 99 degrees; 05/22/21: 141 degrees of active flexion    Time 6    Period Weeks    Status Achieved    Target Date 05/13/21      PT LONG TERM GOAL #3   Title Patient will be able to demonstrate at least 110 degrees of active left shoulder abduction for improved function reaching for her dishes.    Baseline 96 degrees; 05/22/21: 104 degrees of active abduction    Time 6    Period Weeks    Status On-going    Target Date 05/13/21      PT LONG TERM GOAL #4   Title Patient will report no more than 4/10 pain in her right shoulder with eating and dressing.    Baseline intermittent spikes of high pain    Time 6    Period Weeks    Target Date 05/13/21                   Plan - 05/27/21 1123     Clinical Impression Statement Pt arrives for today's treatment session denying any pain.  Pt able to tolerate increased reps with all seated tband exercises.  Pt requiring min cues for proper technique and to avoid compensatory movement with right shoulder.  Pt states that she feels that she will be ready for discharge at her next session on Wednesday.  Pt encouraged to continue performing HEP at home as previously instructed.  Pt denied any pain at completion of today's treatment session.    Personal Factors and Comorbidities Comorbidity 1;Past/Current Experience;Comorbidity 2;Comorbidity 3+    Comorbidities history of back surgery (July 2018), CHF, HTN,  history of prior right shoulder surgery    Examination-Activity Limitations Bathing;Reach Overhead;Self Feeding;Carry;Sleep;Dressing;Hygiene/Grooming    Examination-Participation Restrictions Other    Stability/Clinical Decision Making Evolving/Moderate complexity    Rehab Potential Good  PT Frequency 2x / week    PT Duration 6 weeks    PT Treatment/Interventions ADLs/Self Care Home Management;Electrical Stimulation;Ultrasound;Neuromuscular re-education;Therapeutic exercise;Therapeutic activities;Patient/family education;Manual techniques;Passive range of motion;Vasopneumatic Device    PT Next Visit Plan pendulums, nustep, PROM, vasopneumatic as needed             Patient will benefit from skilled therapeutic intervention in order to improve the following deficits and impairments:  Decreased range of motion, Impaired UE functional use, Decreased activity tolerance, Pain, Decreased strength  Visit Diagnosis: Stiffness of right shoulder, not elsewhere classified  Acute pain of right shoulder     Problem List Patient Active Problem List   Diagnosis Date Noted   S/P TKR (total knee replacement) using cement 03/16/2020   Hyperlipidemia 01/31/2020   AKI (acute kidney injury) ([place]) 09/15/2019   Right flank pain 09/15/2019   Anemia 09/15/2019   Abnormal nuclear cardiac imaging test    CHF (congestive heart failure) ([place]) 07/09/2019   Dyspnea 07/09/2019   Respiratory failure with hypoxia ([place]) 07/09/2019   Shoulder pain 07/09/2019   Dyspnea on exertion 06/03/2019   Bilateral lower extremity edema 06/03/2019   Left bundle branch block 06/03/2019   Iron deficiency anemia due to chronic blood loss 03/10/2017   Postoperative wound dehiscence 12/02/2016   E. coli UTI    Abnormal urinalysis    Medication side effect    Neuropathic pain    History of DVT (deep vein thrombosis)    HTN (hypertension)    RLS (restless legs syndrome)    Urinary retention    Hypokalemia     Hypoalbuminemia due to protein-calorie malnutrition (HCC)    Acute blood loss anemia    Lumbar stenosis with neurogenic claudication 11/18/2016   Spondylolisthesis of lumbar region 11/12/2016   Hereditary hemochromatosis ([place]) 07/28/2016   Prothrombin gene mutation ([place]) 05/30/2013   Right leg DVT ([place]) 05/30/2013   Hemochromatosis 07/29/2012    Kathrynn Ducking, PTA 05/27/2021, 12:03 PM  [facility] [address] [place], [place], [address] Phone: [phone]   Fax:  [phone]  Name: GINNA SCHUUR MRN: [id number] Date of Birth: 1940-07-20

## 2021-05-28 DIAGNOSIS — I5043 Acute on chronic combined systolic (congestive) and diastolic (congestive) heart failure: Secondary | ICD-10-CM | POA: Diagnosis not present

## 2021-05-29 ENCOUNTER — Encounter: Payer: Self-pay | Admitting: Hematology & Oncology

## 2021-05-29 ENCOUNTER — Ambulatory Visit: Payer: PPO | Attending: Orthopedic Surgery

## 2021-05-29 ENCOUNTER — Other Ambulatory Visit: Payer: Self-pay

## 2021-05-29 DIAGNOSIS — M25611 Stiffness of right shoulder, not elsewhere classified: Secondary | ICD-10-CM | POA: Diagnosis not present

## 2021-05-29 DIAGNOSIS — M25511 Pain in right shoulder: Secondary | ICD-10-CM | POA: Diagnosis not present

## 2021-05-29 NOTE — Therapy (Addendum)
[facility] [place], [place], [address] Phone: [phone]   Fax:  [phone]  Physical Therapy Treatment  Patient Details  Name: [name] MRN: [id number] Date of Birth: 07-31-1940 Referring Provider (PT): Supple   Encounter Date: 05/29/2021   PT End of Session - 05/29/21 1027     Visit Number 12    Number of Visits 12    Date for PT Re-Evaluation 06/21/21    PT Start Time 1030    PT Stop Time 1113    PT Time Calculation (min) 43 min    Activity Tolerance Patient tolerated treatment well    Behavior During Therapy [facility] for tasks assessed/performed             Past Medical History:  Diagnosis Date   Anemia    Arthritis    osteoarthritis Shoulder,neck   Bell's palsy 1980s   CHF (congestive heart failure) ([place]) 06/2019   Chronic kidney disease    Stage III   Dyspnea 06/2019   On exertion   GERD (gastroesophageal reflux disease)    Hemochromatosis    History of hiatal hernia    Hypertension    Iron deficiency anemia due to chronic blood loss 03/10/2017   PE (pulmonary embolism) 2007   x2   Peripheral vascular disease ([place])    DVT Rt leg   Pneumonia 1990s   "walking" pneumonia   Prothrombin gene mutation ([place]) 05/30/2013   Restless legs    Right leg DVT ([place]) 05/30/2013   Stroke ([place])    found on a MRI, she's not aware otherwise    Past Surgical History:  Procedure Laterality Date   BACK SURGERY     CARDIAC CATHETERIZATION Bilateral 2005   COLONOSCOPY     IVC FILTER INSERTION N/A 03/12/2020   Procedure: IVC FILTER INSERTION;  Surgeon: Waynetta Sandy, MD;  Location: [place] [facility];  Service: Cardiovascular;  Laterality: N/A;   IVC FILTER REMOVAL N/A 06/11/2020   Procedure: IVC FILTER REMOVAL;  Surgeon: Waynetta Sandy, MD;  Location: [place] [facility];  Service: Cardiovascular;  Laterality: N/A;   REVERSE SHOULDER ARTHROPLASTY Right 03/07/2021   Procedure: REVERSE  SHOULDER ARTHROPLASTY;  Surgeon: Justice Britain, MD;  [facility];  Service: Orthopedics;  Laterality: Right;  117mn   RIGHT/LEFT HEART CATH AND CORONARY ANGIOGRAPHY N/A 07/12/2019   Procedure: RIGHT/LEFT HEART CATH AND CORONARY ANGIOGRAPHY;  Surgeon: KTroy Sine MD;  [facility];  Service: Cardiovascular;  Laterality: N/A;   SHOULDER SURGERY Bilateral    TOTAL KNEE ARTHROPLASTY Right 03/16/2020   Procedure: TOTAL KNEE ARTHROPLASTY;  Surgeon: BSusa Day MD;  [facility];  Service: Orthopedics;  Laterality: Right;  2.5 hrs    There were no vitals filed for this visit.   Subjective Assessment - 05/29/21 1027     Subjective COVID-19 screen performed prior to patient entering clinic.  Pt arrives for today's treatment session denying any pain.  Pt states she is ready for discharge today.    Pertinent History history of back surgery (July 2018), CHF, HTN, history of prior right shoulder surgery    Limitations House hold activities    Patient Stated Goals be able to use her right arm,    Currently in Pain? No/denies    Pain Onset More than a month ago                [facility] - [facility]  Observation/Other Assessments   Focus on Therapeutic Outcomes (FOTO)  76   12th visit; 05/29/21                          [facility] Adult PT Treatment/Exercise - 05/29/21 0001       Shoulder Exercises: Seated   Extension Strengthening;Both;Theraband   25 reps   Theraband Level (Shoulder Extension) Level 1 (Yellow)    Row Both;Theraband   25 reps   Theraband Level (Shoulder Row) Level 1 (Yellow)    Horizontal ABduction Strengthening;Both;Theraband   25 reps   Theraband Level (Shoulder Horizontal ABduction) Level 1 (Yellow)    Internal Rotation Strengthening;Right;20 reps;Theraband    Theraband Level (Shoulder Internal Rotation) Level 1 (Yellow)    Flexion Strengthening;Right;Theraband   25 reps   Abduction AROM;Right;20 reps       Shoulder Exercises: Pulleys   Flexion 5 minutes      Shoulder Exercises: ROM/Strengthening   Nustep Lvl 5 x 15 mins    [place]anger Flexion/extension x 2 mins, CW/CCW circles x 2 mins each                          PT Long Term Goals - 05/29/21 1048       PT LONG TERM GOAL #1   Title Patient will be independent with her HEP.    Time 6    Period Weeks    Status Achieved    Target Date 05/13/21      PT LONG TERM GOAL #2   Title Patient will be able to demonstrate at least 110 degrees of active right shoulder flexion for improved function shopping for groceries.    Baseline 99 degrees; 05/22/21: 141 degrees of active flexion    Time 6    Period Weeks    Status Achieved    Target Date 05/13/21      PT LONG TERM GOAL #3   Title Patient will be able to demonstrate at least 110 degrees of active left shoulder abduction for improved function reaching for her dishes.    Baseline 96 degrees; 05/22/21: 104 degrees of active abduction; 05/29/21: 112 degrees of active abduction    Time 6    Period Weeks    Status Achieved    Target Date 05/13/21      PT LONG TERM GOAL #4   Title Patient will report no more than 4/10 pain in her right shoulder with eating and dressing.    Baseline intermittent spikes of high pain    Time 6    Period Weeks    Status Achieved    Target Date 05/13/21                   Plan - 05/29/21 1027     Clinical Impression Statement Pt arrives for today's treatment session denying any pain.  Pt states that her shoulder is feeling "really good" and that she is pleased with her current level of function.  Pt able to demonstrate 112 degrees of active abduction during today's session.  Pt has met all of her goals at this time and is ready for discharge.  Pt encouraged to call the facility with any questions.  Pt denied any pain at completion of today's treatment session.    Personal Factors and Comorbidities Comorbidity 1;Past/Current  Experience;Comorbidity 2;Comorbidity 3+    Comorbidities history of back surgery (July 2018), CHF, HTN, history of prior right  shoulder surgery    Examination-Activity Limitations Bathing;Reach Overhead;Self Feeding;Carry;Sleep;Dressing;Hygiene/Grooming    Examination-Participation Restrictions Other    Stability/Clinical Decision Making Evolving/Moderate complexity    Rehab Potential Good    PT Frequency 2x / week    PT Duration 6 weeks    PT Treatment/Interventions ADLs/Self Care Home Management;Electrical Stimulation;Ultrasound;Neuromuscular re-education;Therapeutic exercise;Therapeutic activities;Patient/family education;Manual techniques;Passive range of motion;Vasopneumatic Device    PT Next Visit Plan pendulums, nustep, PROM, vasopneumatic as needed             Patient will benefit from skilled therapeutic intervention in order to improve the following deficits and impairments:  Decreased range of motion, Impaired UE functional use, Decreased activity tolerance, Pain, Decreased strength  Visit Diagnosis: Stiffness of right shoulder, not elsewhere classified  Acute pain of right shoulder     Problem List Patient Active Problem List   Diagnosis Date Noted   S/P TKR (total knee replacement) using cement 03/16/2020   Hyperlipidemia 01/31/2020   AKI (acute kidney injury) ([place]) 09/15/2019   Right flank pain 09/15/2019   Anemia 09/15/2019   Abnormal nuclear cardiac imaging test    CHF (congestive heart failure) ([place]) 07/09/2019   Dyspnea 07/09/2019   Respiratory failure with hypoxia ([place]) 07/09/2019   Shoulder pain 07/09/2019   Dyspnea on exertion 06/03/2019   Bilateral lower extremity edema 06/03/2019   Left bundle branch block 06/03/2019   Iron deficiency anemia due to chronic blood loss 03/10/2017   Postoperative wound dehiscence 12/02/2016   E. coli UTI    Abnormal urinalysis    Medication side effect    Neuropathic pain    History of DVT (deep vein thrombosis)     HTN (hypertension)    RLS (restless legs syndrome)    Urinary retention    Hypokalemia    Hypoalbuminemia due to protein-calorie malnutrition (HCC)    Acute blood loss anemia    Lumbar stenosis with neurogenic claudication 11/18/2016   Spondylolisthesis of lumbar region 11/12/2016   Hereditary hemochromatosis ([place]) 07/28/2016   Prothrombin gene mutation ([place]) 05/30/2013   Right leg DVT ([place]) 05/30/2013   Hemochromatosis 07/29/2012    Kathrynn Ducking, PTA 05/29/2021, 11:19 AM  [facility] [address] [place], [place], [address] Phone: [phone]   Fax:  [phone]  Name: [name] MRN: [id number] Date of Birth: [birth year]  PHYSICAL THERAPY DISCHARGE SUMMARY  Visits from Start of Care: 12  Current functional level related to goals / functional outcomes: Patient was able to meet all of her goals for physical therapy and feels comfortable being discharged at this time.    Remaining deficits: None   Education / Equipment: HEP    Patient agrees to discharge. Patient goals were met. Patient is being discharged due to meeting the stated rehab goals.   Jacqulynn Cadet, PT, DPT

## 2021-06-05 DIAGNOSIS — H43813 Vitreous degeneration, bilateral: Secondary | ICD-10-CM | POA: Diagnosis not present

## 2021-06-05 DIAGNOSIS — H04123 Dry eye syndrome of bilateral lacrimal glands: Secondary | ICD-10-CM | POA: Diagnosis not present

## 2021-06-05 DIAGNOSIS — H5203 Hypermetropia, bilateral: Secondary | ICD-10-CM | POA: Diagnosis not present

## 2021-06-05 DIAGNOSIS — Z961 Presence of intraocular lens: Secondary | ICD-10-CM | POA: Diagnosis not present

## 2021-06-07 DIAGNOSIS — Z4789 Encounter for other orthopedic aftercare: Secondary | ICD-10-CM | POA: Diagnosis not present

## 2021-06-07 DIAGNOSIS — Z471 Aftercare following joint replacement surgery: Secondary | ICD-10-CM | POA: Diagnosis not present

## 2021-06-07 DIAGNOSIS — Z96611 Presence of right artificial shoulder joint: Secondary | ICD-10-CM | POA: Diagnosis not present

## 2021-06-25 DIAGNOSIS — I10 Essential (primary) hypertension: Secondary | ICD-10-CM | POA: Diagnosis not present

## 2021-06-25 DIAGNOSIS — E7800 Pure hypercholesterolemia, unspecified: Secondary | ICD-10-CM | POA: Diagnosis not present

## 2021-06-25 DIAGNOSIS — I5043 Acute on chronic combined systolic (congestive) and diastolic (congestive) heart failure: Secondary | ICD-10-CM | POA: Diagnosis not present

## 2021-06-25 DIAGNOSIS — G4700 Insomnia, unspecified: Secondary | ICD-10-CM | POA: Diagnosis not present

## 2021-06-25 DIAGNOSIS — N183 Chronic kidney disease, stage 3 unspecified: Secondary | ICD-10-CM | POA: Diagnosis not present

## 2021-07-10 DIAGNOSIS — M25512 Pain in left shoulder: Secondary | ICD-10-CM | POA: Diagnosis not present

## 2021-07-10 DIAGNOSIS — Z471 Aftercare following joint replacement surgery: Secondary | ICD-10-CM | POA: Diagnosis not present

## 2021-07-10 DIAGNOSIS — Z96611 Presence of right artificial shoulder joint: Secondary | ICD-10-CM | POA: Diagnosis not present

## 2021-07-10 DIAGNOSIS — Z4789 Encounter for other orthopedic aftercare: Secondary | ICD-10-CM | POA: Diagnosis not present

## 2021-07-23 ENCOUNTER — Other Ambulatory Visit: Payer: Self-pay

## 2021-07-23 ENCOUNTER — Inpatient Hospital Stay (HOSPITAL_BASED_OUTPATIENT_CLINIC_OR_DEPARTMENT_OTHER): Payer: PPO | Admitting: Family

## 2021-07-23 ENCOUNTER — Inpatient Hospital Stay: Payer: PPO | Attending: Hematology & Oncology

## 2021-07-23 ENCOUNTER — Encounter: Payer: Self-pay | Admitting: Family

## 2021-07-23 VITALS — BP 131/69 | HR 57 | Temp 97.9°F | Resp 18 | Ht 63.0 in | Wt 168.0 lb

## 2021-07-23 DIAGNOSIS — R5383 Other fatigue: Secondary | ICD-10-CM | POA: Insufficient documentation

## 2021-07-23 DIAGNOSIS — Z7982 Long term (current) use of aspirin: Secondary | ICD-10-CM | POA: Insufficient documentation

## 2021-07-23 DIAGNOSIS — N183 Chronic kidney disease, stage 3 unspecified: Secondary | ICD-10-CM | POA: Diagnosis not present

## 2021-07-23 DIAGNOSIS — D6852 Prothrombin gene mutation: Secondary | ICD-10-CM | POA: Diagnosis not present

## 2021-07-23 DIAGNOSIS — Z86718 Personal history of other venous thrombosis and embolism: Secondary | ICD-10-CM | POA: Insufficient documentation

## 2021-07-23 DIAGNOSIS — I10 Essential (primary) hypertension: Secondary | ICD-10-CM | POA: Diagnosis not present

## 2021-07-23 DIAGNOSIS — D500 Iron deficiency anemia secondary to blood loss (chronic): Secondary | ICD-10-CM | POA: Insufficient documentation

## 2021-07-23 DIAGNOSIS — Z79899 Other long term (current) drug therapy: Secondary | ICD-10-CM | POA: Insufficient documentation

## 2021-07-23 DIAGNOSIS — I82401 Acute embolism and thrombosis of unspecified deep veins of right lower extremity: Secondary | ICD-10-CM

## 2021-07-23 DIAGNOSIS — E7800 Pure hypercholesterolemia, unspecified: Secondary | ICD-10-CM | POA: Diagnosis not present

## 2021-07-23 DIAGNOSIS — I5043 Acute on chronic combined systolic (congestive) and diastolic (congestive) heart failure: Secondary | ICD-10-CM | POA: Diagnosis not present

## 2021-07-23 DIAGNOSIS — K219 Gastro-esophageal reflux disease without esophagitis: Secondary | ICD-10-CM | POA: Diagnosis not present

## 2021-07-23 DIAGNOSIS — R0602 Shortness of breath: Secondary | ICD-10-CM | POA: Insufficient documentation

## 2021-07-23 LAB — CMP (CANCER CENTER ONLY)
ALT: 16 U/L (ref 0–44)
AST: 26 U/L (ref 15–41)
Albumin: 4 g/dL (ref 3.5–5.0)
Alkaline Phosphatase: 99 U/L (ref 38–126)
Anion gap: 7 (ref 5–15)
BUN: 43 mg/dL — ABNORMAL HIGH (ref 8–23)
CO2: 33 mmol/L — ABNORMAL HIGH (ref 22–32)
Calcium: 10 mg/dL (ref 8.9–10.3)
Chloride: 99 mmol/L (ref 98–111)
Creatinine: 1.4 mg/dL — ABNORMAL HIGH (ref 0.44–1.00)
GFR, Estimated: 38 mL/min — ABNORMAL LOW (ref >60)
Glucose, Bld: 96 mg/dL (ref 70–99)
Potassium: 3.8 mmol/L (ref 3.5–5.1)
Sodium: 139 mmol/L (ref 135–145)
Total Bilirubin: 0.5 mg/dL (ref 0.3–1.2)
Total Protein: 6.8 g/dL (ref 6.5–8.1)

## 2021-07-23 LAB — CBC WITH DIFFERENTIAL (CANCER CENTER ONLY)
Abs Immature Granulocytes: 0.01 10*3/uL (ref 0.00–0.07)
Basophils Absolute: 0 10*3/uL (ref 0.0–0.1)
Basophils Relative: 1 %
Eosinophils Absolute: 0.3 10*3/uL (ref 0.0–0.5)
Eosinophils Relative: 8 %
HCT: 35.4 % — ABNORMAL LOW (ref 36.0–46.0)
Hemoglobin: 11.4 g/dL — ABNORMAL LOW (ref 12.0–15.0)
Immature Granulocytes: 0 %
Lymphocytes Relative: 30 %
Lymphs Abs: 1.2 10*3/uL (ref 0.7–4.0)
MCH: 29.7 pg (ref 26.0–34.0)
MCHC: 32.2 g/dL (ref 30.0–36.0)
MCV: 92.2 fL (ref 80.0–100.0)
Monocytes Absolute: 0.3 10*3/uL (ref 0.1–1.0)
Monocytes Relative: 9 %
Neutro Abs: 2 10*3/uL (ref 1.7–7.7)
Neutrophils Relative %: 52 %
Platelet Count: 143 10*3/uL — ABNORMAL LOW (ref 150–400)
RBC: 3.84 MIL/uL — ABNORMAL LOW (ref 3.87–5.11)
RDW: 14.9 % (ref 11.5–15.5)
WBC Count: 3.8 10*3/uL — ABNORMAL LOW (ref 4.0–10.5)
nRBC: 0 % (ref 0.0–0.2)

## 2021-07-23 LAB — IRON AND IRON BINDING CAPACITY (CC-WL,HP ONLY)
Iron: 78 ug/dL (ref 28–170)
Saturation Ratios: 30 % (ref 10.4–31.8)
TIBC: 262 ug/dL (ref 250–450)
UIBC: 184 ug/dL (ref 148–442)

## 2021-07-23 LAB — RETICULOCYTES
Immature Retic Fract: 9.8 % (ref 2.3–15.9)
RBC.: 3.75 MIL/uL — ABNORMAL LOW (ref 3.87–5.11)
Retic Count, Absolute: 43.5 10*3/uL (ref 19.0–186.0)
Retic Ct Pct: 1.2 % (ref 0.4–3.1)

## 2021-07-23 LAB — FERRITIN: Ferritin: 92 ng/mL (ref 11–307)

## 2021-07-23 NOTE — Progress Notes (Signed)
?Hematology and Oncology Follow Up Visit ? ?[name] ?[id number] ?May 03, 1940 81 y.o. ?07/23/2021 ? ? ?Principle Diagnosis:  ?DVT of the right leg ?Prothrombin II gene mutation ?Hemachromatosis (homozygous for C282Y  Mutation) ?Past history of right lower extremity DVT/PE ?Iron deficiency anemia secondary to blood loss ?  ?Current Therapy:        ?Aspirin 162 mg PO daily ?IV iron as indicated  ?  ?Interim History:  [name] is here today for follow-up. She is symptomatic with mild fatigue and some SOB with exertion. She will take a break to rest when needed.  ?No blood loss noted. No bruising or petechiae.  ?No fever, chills, n/v, cough, rash, dizziness, chest pain, palpitations, abdominal pain or change sin bowel or bladder habits.  ?No swelling, numbness or tingling in her extremities at this time.  ?She had a fall last month but thankfully states that she was not seriously injured.  ?She is ambulating with her Rolator for added support.  ?No syncope to report.  ?She has maintained a good appetite and is staying well hydrated. Her weight is stable at 186 lbs.  ? ?ECOG Performance Status: 1 - Symptomatic but completely ambulatory ? ?Medications:  ?Allergies as of 07/23/2021   ? ?   Reactions  ? Morphine And Related Other (See Comments)  ? LARGER DOSES OF MORPHINE CAUSES BODY TWITCHING  ? Penicillins Anaphylaxis  ? Childhood reaction ?Has patient had a PCN reaction causing immediate rash, facial/tongue/throat swelling, SOB or lightheadedness with hypotension: Yes ?Has patient had a PCN reaction causing severe rash involving mucus membranes or skin necrosis: Unknown ?Has patient had a PCN reaction that required hospitalization: No ?Has patient had a PCN reaction occurring within the last 10 years: no (5 or 81 yrs old) ?If all of the above answers are "NO", then may proceed with Cephalosporin use.  ? Sulfa Antibiotics Other (See Comments)  ? Unknown childhood reaction  ? ?  ? ?  ?Medication List  ?  ? ?  ?  Accurate as of July 23, 2021 10:46 AM. If you have any questions, ask your nurse or doctor.  ?  ?  ? ?  ? ?allopurinol 300 MG tablet ?Commonly known as: ZYLOPRIM ?Take 300 mg by mouth in the morning. ?  ?ARTIFICIAL TEARS OP ?Place 1 drop into both eyes 3 (three) times daily as needed (dry eyes). ?  ?aspirin EC 81 MG tablet ?Take 162 mg by mouth in the morning. Swallow whole. ?  ?atorvastatin 40 MG tablet ?Commonly known as: LIPITOR ?Take 1 tablet (40 mg total) by mouth daily at 6 PM. ?  ?Biotin 1000 MCG tablet ?Take 1,000 mcg by mouth in the morning. ?  ?CALCIUM 600+D3 PO ?Take 1 tablet by mouth in the morning and at bedtime. ?  ?cholecalciferol 25 MCG (1000 UNIT) tablet ?Commonly known as: VITAMIN D ?Take 1,000 Units by mouth in the morning and at bedtime. ?  ?citalopram 20 MG tablet ?Commonly known as: CELEXA ?Take 20 mg by mouth at bedtime. ?  ?colchicine 0.6 MG tablet ?Take 0.6 mg by mouth daily as needed (gout flares). ?  ?cyclobenzaprine 10 MG tablet ?Commonly known as: FLEXERIL ?Take 10 mg by mouth 3 (three) times daily as needed for muscle spasms. ?  ?cycloSPORINE 0.05 % ophthalmic emulsion ?Commonly known as: RESTASIS ?Place 1 drop into both eyes 2 (two) times daily. ?  ?ferrous sulfate 325 (65 FE) MG EC tablet ?Take 325 mg by mouth in the morning. ?  ?  FISH OIL PO ?Take 1 capsule by mouth in the morning. ?  ?furosemide 80 MG tablet ?Commonly known as: Lasix ?Take 1 tablet (80 mg total) by mouth daily. ?  ?losartan 50 MG tablet ?Commonly known as: COZAAR ?Take 1 tablet (50 mg total) by mouth daily. ?  ?metoprolol succinate 25 MG 24 hr tablet ?Commonly known as: TOPROL-XL ?TAKE ONE TABLET BY MOUTH ONCE DAILY ?  ?multivitamin with minerals Tabs tablet ?Take 1 tablet by mouth in the morning. Centrum Silver (NO IRON) ?  ?omeprazole 20 MG capsule ?Commonly known as: PRILOSEC ?Take 20 mg by mouth daily before breakfast. ?  ?ondansetron 4 MG tablet ?Commonly known as: ZOFRAN ?Take by mouth. ?   ?oxyCODONE-acetaminophen 5-325 MG tablet ?Commonly known as: PERCOCET/ROXICET ?Take 1 tablet by mouth daily as needed (pain.). ?  ?polycarbophil 625 MG tablet ?Commonly known as: FIBERCON ?Take 625 mg by mouth in the morning and at bedtime. ?  ?pramipexole 0.5 MG tablet ?Commonly known as: MIRAPEX ?Take 1 mg by mouth every evening. ?  ?temazepam 15 MG capsule ?Commonly known as: RESTORIL ?Take 1 capsule (15 mg total) by mouth at bedtime. ?  ?Turmeric Curcumin 500 MG Caps ?Take 500 mg by mouth at bedtime. ?  ?vitamin E 180 MG (400 UNITS) capsule ?Take 400 Units by mouth at bedtime. ?  ? ?  ? ? ?Allergies:  ?Allergies  ?Allergen Reactions  ? Morphine And Related Other (See Comments)  ?  LARGER DOSES OF MORPHINE CAUSES BODY TWITCHING  ? Penicillins Anaphylaxis  ?  Childhood reaction ?Has patient had a PCN reaction causing immediate rash, facial/tongue/throat swelling, SOB or lightheadedness with hypotension: Yes ?Has patient had a PCN reaction causing severe rash involving mucus membranes or skin necrosis: Unknown ?Has patient had a PCN reaction that required hospitalization: No ?Has patient had a PCN reaction occurring within the last 10 years: no (5 or 81 yrs old) ?If all of the above answers are "NO", then may proceed with Cephalosporin use. ?  ? Sulfa Antibiotics Other (See Comments)  ?  Unknown childhood reaction  ? ? ?Past Medical History, Surgical history, Social history, and Family History were reviewed and updated. ? ?Review of Systems: ?All other 10 point review of systems is negative.  ? ?Physical Exam: ? height is '5\' 3"'$  (1.6 m) and weight is 168 lb (76.2 kg). Her oral temperature is 97.9 ?F (36.6 ?C). Her blood pressure is 131/69 and her pulse is 57 (abnormal). Her respiration is 18 and oxygen saturation is 98%.  ? ?Wt Readings from Last 3 Encounters:  ?07/23/21 168 lb (76.2 kg)  ?03/07/21 177 lb 4 oz (80.4 kg)  ?02/26/21 177 lb 4 oz (80.4 kg)  ? ? ?Ocular: Sclerae unicteric, pupils equal, round and  reactive to light ?Ear-nose-throat: Oropharynx clear, dentition fair ?Lymphatic: No cervical or supraclavicular adenopathy ?Lungs no rales or rhonchi, good excursion bilaterally ?Heart regular rate and rhythm, no murmur appreciated ?Abd soft, nontender, positive bowel sounds ?MSK no focal spinal tenderness, no joint edema ?Neuro: non-focal, well-oriented, appropriate affect ?Breasts: Deferred  ? ?Lab Results  ?Component Value Date  ? WBC 3.8 (L) 07/23/2021  ? HGB 11.4 (L) 07/23/2021  ? HCT 35.4 (L) 07/23/2021  ? MCV 92.2 07/23/2021  ? PLT 143 (L) 07/23/2021  ? ?Lab Results  ?Component Value Date  ? FERRITIN 196 01/23/2021  ? IRON 73 01/23/2021  ? TIBC 213 (L) 01/23/2021  ? UIBC 139 01/23/2021  ? IRONPCTSAT 34 01/23/2021  ? ?Lab Results  ?  Component Value Date  ? RETICCTPCT 1.2 07/23/2021  ? RBC 3.84 (L) 07/23/2021  ? RETICCTABS 39.7 05/11/2014  ? ?No results found for: KPAFRELGTCHN, LAMBDASER, KAPLAMBRATIO ?No results found for: IGGSERUM, IGA, IGMSERUM ?No results found for: TOTALPROTELP, ALBUMINELP, A1GS, A2GS, BETS, BETA2SER, GAMS, MSPIKE, SPEI ?  Chemistry   ?   ?Component Value Date/Time  ? NA 138 02/26/2021 1111  ? NA 140 11/15/2019 1023  ? NA 144 04/13/2017 1134  ? NA 142 07/28/2016 1144  ? K 3.9 02/26/2021 1111  ? K 3.3 04/13/2017 1134  ? K 3.6 07/28/2016 1144  ? CL 102 02/26/2021 1111  ? CL 97 (L) 04/13/2017 1134  ? CO2 30 02/26/2021 1111  ? CO2 32 04/13/2017 1134  ? CO2 33 (H) 07/28/2016 1144  ? BUN 35 (H) 02/26/2021 1111  ? BUN 31 (H) 11/15/2019 1023  ? BUN 18 04/13/2017 1134  ? BUN 22.6 07/28/2016 1144  ? CREATININE 1.02 (H) 02/26/2021 1111  ? CREATININE 1.17 (H) 01/23/2021 1000  ? CREATININE 1.0 04/13/2017 1134  ? CREATININE 1.0 07/28/2016 1144  ?    ?Component Value Date/Time  ? CALCIUM 9.2 02/26/2021 1111  ? CALCIUM 10.1 04/13/2017 1134  ? CALCIUM 9.5 07/28/2016 1144  ? ALKPHOS 84 01/23/2021 1000  ? ALKPHOS 103 (H) 04/13/2017 1134  ? ALKPHOS 84 07/28/2016 1144  ? AST 24 01/23/2021 1000  ? AST 24  07/28/2016 1144  ? ALT 14 01/23/2021 1000  ? ALT 27 04/13/2017 1134  ? ALT 17 07/28/2016 1144  ? BILITOT 0.6 01/23/2021 1000  ? BILITOT 0.41 07/28/2016 1144  ?  ? ? ? ?Impression and Plan: [name] is a very pleasant 81 yo caucasian female

## 2021-07-26 DIAGNOSIS — I5043 Acute on chronic combined systolic (congestive) and diastolic (congestive) heart failure: Secondary | ICD-10-CM | POA: Diagnosis not present

## 2021-07-31 DIAGNOSIS — M19012 Primary osteoarthritis, left shoulder: Secondary | ICD-10-CM | POA: Diagnosis not present

## 2021-07-31 DIAGNOSIS — M19031 Primary osteoarthritis, right wrist: Secondary | ICD-10-CM | POA: Diagnosis not present

## 2021-07-31 DIAGNOSIS — M19032 Primary osteoarthritis, left wrist: Secondary | ICD-10-CM | POA: Diagnosis not present

## 2021-07-31 DIAGNOSIS — M47812 Spondylosis without myelopathy or radiculopathy, cervical region: Secondary | ICD-10-CM | POA: Diagnosis not present

## 2021-07-31 DIAGNOSIS — Z9889 Other specified postprocedural states: Secondary | ICD-10-CM | POA: Diagnosis not present

## 2021-07-31 DIAGNOSIS — M152 Bouchard's nodes (with arthropathy): Secondary | ICD-10-CM | POA: Diagnosis not present

## 2021-07-31 DIAGNOSIS — M19011 Primary osteoarthritis, right shoulder: Secondary | ICD-10-CM | POA: Diagnosis not present

## 2021-07-31 DIAGNOSIS — M151 Heberden's nodes (with arthropathy): Secondary | ICD-10-CM | POA: Diagnosis not present

## 2021-08-01 ENCOUNTER — Other Ambulatory Visit: Payer: Self-pay | Admitting: Cardiovascular Disease

## 2021-08-07 DIAGNOSIS — E7800 Pure hypercholesterolemia, unspecified: Secondary | ICD-10-CM | POA: Diagnosis not present

## 2021-08-07 DIAGNOSIS — F329 Major depressive disorder, single episode, unspecified: Secondary | ICD-10-CM | POA: Diagnosis not present

## 2021-08-07 DIAGNOSIS — I5043 Acute on chronic combined systolic (congestive) and diastolic (congestive) heart failure: Secondary | ICD-10-CM | POA: Diagnosis not present

## 2021-08-07 DIAGNOSIS — I10 Essential (primary) hypertension: Secondary | ICD-10-CM | POA: Diagnosis not present

## 2021-08-08 ENCOUNTER — Telehealth: Payer: Self-pay | Admitting: Cardiovascular Disease

## 2021-08-08 NOTE — Telephone Encounter (Signed)
? ?  [name] with [facility] physician. She said, pt's weight is continuing to increase, current weight 184 lbs. Also, pt said she is not watching what she eats, during easter she eats a lot of sodium foods like, ham, pies, casserole, beans. [name] is asking if Dr. Gwenlyn Found wants to adjust pt's diuretics. She said to call her back if there's any questions or call pt for recommendations  ?

## 2021-08-08 NOTE — Telephone Encounter (Signed)
Spoke with Joellen Jersey at [facility] regarding pt's increase in weight. Joellen Jersey states that pt has issues with salt restriction and she often talks to her about her increased weight. Joellen Jersey states that the pt admits to having may high sodium foods over the easter holiday including ham, casseroles, beans, pies and ice cream. Joellen Jersey reports that pt is usually around 178lbs but weight was up to 184lbs on 4/12, pt reports today that she is down to 182lbs. Explained diuretic protocol to double up on lasix for 3 days to help get weight down. Told katie that pt hasn't been seen in over a year so we should get her back for office visit as well. Katie agrees with this plan, I will call the pt and make her aware. ? ? ? ?Spoke with pt regarding her weight and sodium indiscretion over the holiday weekend.  Pt states that she is unsure about her dry weight, per chart pt was recently seen at her oncology office on 07/23/21 and weight that day was 168lbs. Pt states that she knows that she is supposed to be on salt restriction but that she was enjoying food with her family for the holidays. Pt told to double up on her lasix for 3 days then resume original dosage and call us on Monday to let us know how her weight is doing. Also, schedule pt for office visit. Pt verbalizes understanding.  ?

## 2021-08-25 DIAGNOSIS — I5043 Acute on chronic combined systolic (congestive) and diastolic (congestive) heart failure: Secondary | ICD-10-CM | POA: Diagnosis not present

## 2021-08-27 ENCOUNTER — Other Ambulatory Visit: Payer: Self-pay | Admitting: Family Medicine

## 2021-08-27 DIAGNOSIS — Z1231 Encounter for screening mammogram for malignant neoplasm of breast: Secondary | ICD-10-CM

## 2021-08-27 NOTE — Progress Notes (Signed)
? ?Cardiology Clinic Note  ? ?Patient Name: [name] ?Date of Encounter: 08/29/2021 ? ?Primary Care Provider:  Shirline Frees, MD ?Primary Cardiologist:  Quay Burow, MD ? ?Patient Profile  ?  ?[name] 81 year old female presents to the clinic today for follow-up evaluation of her CHF. ? ?Past Medical History  ?  ?Past Medical History:  ?Diagnosis Date  ? Anemia   ? Arthritis   ? osteoarthritis Shoulder,neck  ? Bell's palsy 1980s  ? CHF (congestive heart failure) ([place]) 06/2019  ? Chronic kidney disease   ? Stage III  ? Dyspnea 06/2019  ? On exertion  ? GERD (gastroesophageal reflux disease)   ? Hemochromatosis   ? History of hiatal hernia   ? Hypertension   ? Iron deficiency anemia due to chronic blood loss 03/10/2017  ? PE (pulmonary embolism) 2007  ? x2  ? Peripheral vascular disease ([place])   ? DVT Rt leg  ? Pneumonia 1990s  ? "walking" pneumonia  ? Prothrombin gene mutation ([place]) 05/30/2013  ? Restless legs   ? Right leg DVT ([place]) 05/30/2013  ? Stroke [facility])   ? found on a MRI, she's not aware otherwise  ? ?Past Surgical History:  ?Procedure Laterality Date  ? BACK SURGERY    ? CARDIAC CATHETERIZATION Bilateral 2005  ? COLONOSCOPY    ? IVC FILTER INSERTION N/A 03/12/2020  ? Procedure: IVC FILTER INSERTION;  Surgeon: Waynetta Sandy, MD;  Location: [facility];  Service: Cardiovascular;  Laterality: N/A;  ? IVC FILTER REMOVAL N/A 06/11/2020  ? Procedure: IVC FILTER REMOVAL;  Surgeon: Waynetta Sandy, MD;  Location: [facility];  Service: Cardiovascular;  Laterality: N/A;  ? REVERSE SHOULDER ARTHROPLASTY Right 03/07/2021  ? Procedure: REVERSE SHOULDER ARTHROPLASTY;  Surgeon: Justice Britain, MD;  Location: [facility];  Service: Orthopedics;  Laterality: Right;  144mn  ? RIGHT/LEFT HEART CATH AND CORONARY ANGIOGRAPHY N/A 07/12/2019  ? Procedure: RIGHT/LEFT HEART CATH AND CORONARY ANGIOGRAPHY;  Surgeon: KTroy Sine MD;  [facility];  Service:  Cardiovascular;  Laterality: N/A;  ? SHOULDER SURGERY Bilateral   ? TOTAL KNEE ARTHROPLASTY Right 03/16/2020  ? Procedure: TOTAL KNEE ARTHROPLASTY;  Surgeon: BSusa Day MD;  Location: [facility];  Service: Orthopedics;  Laterality: Right;  2.5 hrs  ? ? ?Allergies ? ?Allergies  ?Allergen Reactions  ? Morphine And Related Other (See Comments)  ?  LARGER DOSES OF MORPHINE CAUSES BODY TWITCHING  ? Penicillins Anaphylaxis  ?  Childhood reaction ?Has patient had a PCN reaction causing immediate rash, facial/tongue/throat swelling, SOB or lightheadedness with hypotension: Yes ?Has patient had a PCN reaction causing severe rash involving mucus membranes or skin necrosis: Unknown ?Has patient had a PCN reaction that required hospitalization: No ?Has patient had a PCN reaction occurring within the last 10 years: no (5 or 81yrs old) ?If all of the above answers are "NO", then may proceed with Cephalosporin use. ?  ? Baclofen   ?  Other reaction(s): Unknown  ? Celecoxib   ?  Other reaction(s): Unknown  ? Doxycycline Other (See Comments)  ? Levofloxacin   ?  Other reaction(s): abnl taste  ? Penicillin G Benzathine Other (See Comments)  ? Pneumococcal Vac Polyvalent   ?  Other reaction(s): local reaction  ? Sulfamethoxazole Other (See Comments)  ? Tetanus Toxoid, Adsorbed   ?  Other reaction(s): local reaction  ? Sulfa Antibiotics Other (See Comments)  ?  Unknown childhood reaction  ? ? ?  History of Present Illness  ?  ?[name] has a PMH of right leg DVT, hypertension, LBBB, CHF, AKI, hypokalemia, DOE, HLD, anemia, and total knee replacement. She may have also had a remote CVA.  Is noted to have progressive DOE over the last several years.  She was admitted to the hospital 3/21 for 4 days with CHF.  She underwent right and left heart cath by Dr. Claiborne Billings that showed 40% proximal LAD with elevated right atrial pressures, pulmonary artery pressure, wedge pressure and LVEDP.  She was diuresed at that time.  Her admission weight  on 06/03/2019 was 188. ?  ?She was  seen by Dr. Gwenlyn Found on 08/16/2019.  During that time she denied chest pain.  Her weight was 172 pounds.  She was following a low-sodium diet, and was taking furosemide 80 mg daily.  She denied shortness of breath. ?  ?She presented to the clinic 11/15/2019 for follow-up evaluation and stated she felt well .  She was having some right shoulder and right knee pain.  She was in the emergency department recently with right shoulder pain and was told it was related to arthritis.  She was also seeing orthopedics for her right knee pain.  She was planning to have a total knee replacement in the future.  She was unsure whether she would pursue this or not.  She stated that her blood pressure was better controlled and that she  had no chest discomfort. ?  ?She underwent IVC filter placement and bilateral renal veins 03/12/2020 by Dr. Donzetta Matters.  This was done prior to her right total knee arthroplasty.  Her IVC filters were removed 06/11/2020. ?  ?She presented to the clinic 08/02/20 for follow-up evaluation and stated she felt well.  She had been maintaining her low-sodium diet.  She reported that she  had some pain in her knee since having her knee replacement.  She also noted she was having nosebleeds and was taken off of her Eliquis.  She was taking 2 baby aspirin and had not had any further episodes of bleeding.  She reported that she had not been very physically active because she is worried about falls.  I asked her to use her walker on level ground and increase her physical activity.  I asked her to maintain her low-sodium diet, increase her physical activity, and planned follow-up in 6 months with Dr. Gwenlyn Found. ? ?She contacted the clinic 08/08/2021 and reported weight gain.  She was instructed on low-sodium diet. ? ?She presents to the clinic today for follow-up evaluation and states over the last several days she has noticed swelling in her ankles.  She reports that she occasionally eats out and  is unable to control the amount of sodium in that food.  She is otherwise conscious and eats a low-sodium diet.  She reports that she is not very physically active.  She has been sleeping in a recliner due to the arthritis in her left shoulder.  She does notice some increased shortness of breath with increased physical activity and bending over.  She denies chest pain.  She has noticed some vaginal bleeding and has appointment with GYN next week.  She denies orthopnea PND.  I will increase her furosemide for 2 days, give supplemental potassium for 2 days and then have her return to her normal dosing.  We will give her the salty 6 diet sheet order a BMP in 1 week and plan follow-up in 4 to 6 months.   ? ?  Today she denies chest pain, fatigue, palpitations, melena,  hemoptysis, diaphoresis, weakness, presyncope, syncope, orthopnea, and PND. ? ?Home Medications  ?  ?Prior to Admission medications   ?Medication Sig Start Date End Date Taking? Authorizing Provider  ?ALBUTEROL IN 1 tab    [provider]  ?allopurinol (ZYLOPRIM) 300 MG tablet Take 300 mg by mouth in the morning. 11/12/20   [provider]  ?ASPIRIN 81 PO     [provider]  ?aspirin EC 81 MG tablet Take 162 mg by mouth in the morning. Swallow whole.    [provider]  ?atorvastatin (LIPITOR) 40 MG tablet Take 1 tablet (40 mg total) by mouth daily at 6 PM. 07/13/19   Oswald Hillock, MD  ?Biotin 1000 MCG tablet Take 1,000 mcg by mouth in the morning.    [provider]  ?Calcium Carb-Cholecalciferol (CALCIUM 600+D3 PO) Take 1 tablet by mouth in the morning and at bedtime.    [provider]  ?cholecalciferol (VITAMIN D) 25 MCG (1000 UNIT) tablet Take 1,000 Units by mouth in the morning and at bedtime.    [provider]  ?citalopram (CELEXA) 20 MG tablet Take 20 mg by mouth at bedtime.  08/05/11   [provider]  ?colchicine 0.6 MG tablet Take 0.6 mg by mouth daily as needed (gout flares).  01/20/20   [provider]  ?cyclobenzaprine (FLEXERIL) 10 MG tablet Take 10 mg by mouth 3 (three) times daily as needed for muscle spasms. 02/21/21   [provider]  ?cycloSPORINE (RESTASIS) 0.

## 2021-08-29 ENCOUNTER — Encounter: Payer: Self-pay | Admitting: General Practice

## 2021-08-29 ENCOUNTER — Ambulatory Visit: Payer: PPO | Admitting: General Practice

## 2021-08-29 VITALS — BP 102/60 | HR 57 | Ht 63.0 in | Wt 182.4 lb

## 2021-08-29 DIAGNOSIS — I5022 Chronic systolic (congestive) heart failure: Secondary | ICD-10-CM | POA: Diagnosis not present

## 2021-08-29 DIAGNOSIS — I2510 Atherosclerotic heart disease of native coronary artery without angina pectoris: Secondary | ICD-10-CM

## 2021-08-29 DIAGNOSIS — I10 Essential (primary) hypertension: Secondary | ICD-10-CM

## 2021-08-29 MED ORDER — POTASSIUM CHLORIDE CRYS ER 20 MEQ PO TBCR: EXTENDED_RELEASE_TABLET | ORAL | 0 refills | Status: DC

## 2021-08-29 NOTE — Patient Instructions (Signed)
Medication Instructions:  ?Take furosemide 160 mg (2 tablets) on Friday and Saturday. After Saturday, go back to your regular furosemide dose of 80 mg daily. ?*If you need a refill on your cardiac medications before your next appointment, please call your pharmacy* ? ?Lab Work: ?Return in 1 week for lab work (BMP) ?If you have labs (blood work) drawn today and your tests are completely normal, you will receive your results only by: ?MyChart Message (if you have MyChart) OR ?A paper copy in the mail ?If you have any lab test that is abnormal or we need to change your treatment, we will call you to review the results. ? ?Follow-Up: ?At [facility], you and your health needs are our priority.  As part of our continuing mission to provide you with exceptional heart care, we have created designated Provider Care Teams.  These Care Teams include your primary Cardiologist (physician) and Advanced Practice Providers (APPs -  Physician Assistants and Nurse Practitioners) who all work together to provide you with the care you need, when you need it. ? ?We recommend signing up for the patient portal called "MyChart".  Sign up information is provided on this After Visit Summary.  MyChart is used to connect with patients for Virtual Visits (Telemedicine).  Patients are able to view lab/test results, encounter notes, upcoming appointments, etc.  Non-urgent messages can be sent to your provider as well.   ?To learn more about what you can do with MyChart, go to [email or web address].   ? ?Your next appointment:   ?4-6 month(s) ? ?The format for your next appointment:   ?In Person ? ?Provider:   ?Thomasene Mohair, FNP or Dr. Gwenlyn Found, MD ? ? ?Other Instructions ?Follow the salty 6 diet provided to you. ?2. After voiding each morning, and before eating, weigh yourself. If there is a 3 pound increase over night or a 5 pound increase in 1 week, call our clinic. ?3. Increase your inside walking. ?5. Elevate your legs when sitting. ?6.. Do  the following chair exercises. ?Exercises to do While Sitting ? ?Exercises that you do while sitting (chair exercises) can give you many of the same benefits as full exercise. Benefits include strengthening your heart, burning calories, and keeping muscles and joints healthy. Exercise can also improve your mood and help with depression and anxiety. ?You may benefit from chair exercises if you are unable to do standing exercises due to: ?Diabetic foot pain. ?Obesity. ?Illness. ?Arthritis. ?Recovery from surgery or injury. ?Breathing problems. ?Balance problems. ?Another type of disability. ?Before starting chair exercises, check with your health care provider or a physical therapist to find out how much exercise you can tolerate and which exercises are safe for you. If your health care provider approves: ?Start out slowly and build up over time. Aim to work up to about 10-20 minutes for each exercise session. ?Make exercise part of your daily routine. ?Drink water when you exercise. Do not wait until you are thirsty. Drink every 10-15 minutes. ?Stop exercising right away if you have pain, nausea, shortness of breath, or dizziness. ?If you are exercising in a wheelchair, make sure to lock the wheels. ?Ask your health care provider whether you can do tai chi or yoga. Many positions in these mind-body exercises can be modified to do while seated. ?Warm-up ?Before starting other exercises: ?Sit up as straight as you can. Have your knees bent at 90 degrees, which is the shape of the capital letter "L." Keep your feet flat on the floor. ?  Sit at the front edge of your chair, if you can. ?Pull in (tighten) the muscles in your abdomen and stretch your spine and neck as straight as you can. Hold this position for a few minutes. ?Breathe in and out evenly. Try to concentrate on your breathing, and relax your mind. ?Stretching ?Exercise A: Arm stretch ?Hold your arms out straight in front of your body. ?Bend your hands at the  wrist with your fingers pointing up, as if signaling someone to stop. Notice the slight tension in your forearms as you hold the position. ?Keeping your arms out and your hands bent, rotate your hands outward as far as you can and hold this stretch. Aim to have your thumbs pointing up and your pinkie fingers pointing down. ?Slowly repeat arm stretches for one minute as tolerated. ?Exercise B: Leg stretch ?If you can move your legs, try to "draw" letters on the floor with the toes of your foot. Write your name with one foot. ?Write your name with the toes of your other foot. ?Slowly repeat the movements for one minute as tolerated. ?Exercise C: Reach for the sky ?Reach your hands as far over your head as you can to stretch your spine. ?Move your hands and arms as if you are climbing a rope. ?Slowly repeat the movements for one minute as tolerated. ?Range of motion exercises ?Exercise A: Shoulder roll ?Let your arms hang loosely at your sides. ?Lift just your shoulders up toward your ears, then let them relax back down. ?When your shoulders feel loose, rotate your shoulders in backward and forward circles. ?Do shoulder rolls slowly for one minute as tolerated. ?Exercise B: March in place ?As if you are marching, pump your arms and lift your legs up and down. Lift your knees as high as you can. ?If you are unable to lift your knees, just pump your arms and move your ankles and feet up and down. ?March in place for one minute as tolerated. ?Exercise C: Seated jumping jacks ?Let your arms hang down straight. ?Keeping your arms straight, lift them up over your head. Aim to point your fingers to the ceiling. ?While you lift your arms, straighten your legs and slide your heels along the floor to your sides, as wide as you can. ?As you bring your arms back down to your sides, slide your legs back together. ?If you are unable to use your legs, just move your arms. ?Slowly repeat seated jumping jacks for one minute as  tolerated. ?Strengthening exercises ?Exercise A: Shoulder squeeze ?Hold your arms straight out from your body to your sides, with your elbows bent and your fists pointed at the ceiling. ?Keeping your arms in the bent position, move them forward so your elbows and forearms meet in front of your face. ?Open your arms back out as wide as you can with your elbows still bent, until you feel your shoulder blades squeezing together. Hold for 5 seconds. ?Slowly repeat the movements forward and backward for one minute as tolerated. ?Contact a health care provider if: ?You have to stop exercising due to any of the following: ?Pain. ?Nausea. ?Shortness of breath. ?Dizziness. ?Fatigue. ?You have significant pain or soreness after exercising. ?Get help right away if: ?You have chest pain. ?You have difficulty breathing. ?These symptoms may represent a serious problem that is an emergency. Do not wait to see if the symptoms will go away. Get medical help right away. Call your local emergency services (911 in the [place] Do not  drive yourself to the hospital. ?Summary ?Exercises that you do while sitting (chair exercises) can strengthen your heart, burn calories, and keep muscles and joints healthy. ?You may benefit from chair exercises if you are unable to do standing exercises due to diabetic foot pain, obesity, recovery from surgery or injury, or other conditions. ?Before starting chair exercises, check with your health care provider or a physical therapist to find out how much exercise you can tolerate and which exercises are safe for you. ?This information is not intended to replace advice given to you by your health care provider. Make sure you discuss any questions you have with your health care provider. ?Document Revised: 06/10/2020 Document Reviewed: 06/10/2020 ?Elsevier Patient Education ? [place]. ? ? ? ? ? ? ? ?

## 2021-08-30 ENCOUNTER — Ambulatory Visit: Payer: PPO | Admitting: General Practice

## 2021-09-10 DIAGNOSIS — I2510 Atherosclerotic heart disease of native coronary artery without angina pectoris: Secondary | ICD-10-CM | POA: Diagnosis not present

## 2021-09-10 DIAGNOSIS — N950 Postmenopausal bleeding: Secondary | ICD-10-CM | POA: Diagnosis not present

## 2021-09-10 DIAGNOSIS — N898 Other specified noninflammatory disorders of vagina: Secondary | ICD-10-CM | POA: Diagnosis not present

## 2021-09-10 DIAGNOSIS — I10 Essential (primary) hypertension: Secondary | ICD-10-CM | POA: Diagnosis not present

## 2021-09-10 DIAGNOSIS — I5022 Chronic systolic (congestive) heart failure: Secondary | ICD-10-CM | POA: Diagnosis not present

## 2021-09-10 DIAGNOSIS — R39198 Other difficulties with micturition: Secondary | ICD-10-CM | POA: Diagnosis not present

## 2021-09-10 DIAGNOSIS — N812 Incomplete uterovaginal prolapse: Secondary | ICD-10-CM | POA: Diagnosis not present

## 2021-09-11 LAB — BASIC METABOLIC PANEL
BUN/Creatinine Ratio: 27 (ref 12–28)
BUN: 32 mg/dL — ABNORMAL HIGH (ref 8–27)
CO2: 27 mmol/L (ref 20–29)
Calcium: 10 mg/dL (ref 8.7–10.3)
Chloride: 99 mmol/L (ref 96–106)
Creatinine, Ser: 1.2 mg/dL — ABNORMAL HIGH (ref 0.57–1.00)
Glucose: 91 mg/dL (ref 70–99)
Potassium: 4.1 mmol/L (ref 3.5–5.2)
Sodium: 141 mmol/L (ref 134–144)
eGFR: 45 mL/min/{1.73_m2} — ABNORMAL LOW (ref >59)

## 2021-09-18 DIAGNOSIS — I5043 Acute on chronic combined systolic (congestive) and diastolic (congestive) heart failure: Secondary | ICD-10-CM | POA: Diagnosis not present

## 2021-09-18 DIAGNOSIS — E7800 Pure hypercholesterolemia, unspecified: Secondary | ICD-10-CM | POA: Diagnosis not present

## 2021-09-18 DIAGNOSIS — N183 Chronic kidney disease, stage 3 unspecified: Secondary | ICD-10-CM | POA: Diagnosis not present

## 2021-09-18 DIAGNOSIS — I10 Essential (primary) hypertension: Secondary | ICD-10-CM | POA: Diagnosis not present

## 2021-09-25 DIAGNOSIS — I5043 Acute on chronic combined systolic (congestive) and diastolic (congestive) heart failure: Secondary | ICD-10-CM | POA: Diagnosis not present

## 2021-10-01 ENCOUNTER — Other Ambulatory Visit: Payer: Self-pay

## 2021-10-01 DIAGNOSIS — N8110 Cystocele, unspecified: Secondary | ICD-10-CM | POA: Diagnosis not present

## 2021-10-01 DIAGNOSIS — N858 Other specified noninflammatory disorders of uterus: Secondary | ICD-10-CM | POA: Diagnosis not present

## 2021-10-01 DIAGNOSIS — N950 Postmenopausal bleeding: Secondary | ICD-10-CM | POA: Diagnosis not present

## 2021-10-01 DIAGNOSIS — N814 Uterovaginal prolapse, unspecified: Secondary | ICD-10-CM | POA: Diagnosis not present

## 2021-10-01 DIAGNOSIS — N816 Rectocele: Secondary | ICD-10-CM | POA: Diagnosis not present

## 2021-10-04 ENCOUNTER — Ambulatory Visit
Admission: RE | Admit: 2021-10-04 | Discharge: 2021-10-04 | Disposition: A | Discharge status: Home / Self Care | Payer: PPO | Source: Ambulatory Visit | Attending: Family Medicine | Admitting: Family Medicine

## 2021-10-04 DIAGNOSIS — Z1231 Encounter for screening mammogram for malignant neoplasm of breast: Secondary | ICD-10-CM | POA: Diagnosis not present

## 2021-10-21 DIAGNOSIS — K625 Hemorrhage of anus and rectum: Secondary | ICD-10-CM | POA: Diagnosis not present

## 2021-10-21 DIAGNOSIS — N950 Postmenopausal bleeding: Secondary | ICD-10-CM | POA: Diagnosis not present

## 2021-10-21 DIAGNOSIS — N812 Incomplete uterovaginal prolapse: Secondary | ICD-10-CM | POA: Diagnosis not present

## 2021-10-25 DIAGNOSIS — I5043 Acute on chronic combined systolic (congestive) and diastolic (congestive) heart failure: Secondary | ICD-10-CM | POA: Diagnosis not present

## 2021-10-31 DIAGNOSIS — F3342 Major depressive disorder, recurrent, in full remission: Secondary | ICD-10-CM | POA: Diagnosis not present

## 2021-10-31 DIAGNOSIS — N183 Chronic kidney disease, stage 3 unspecified: Secondary | ICD-10-CM | POA: Diagnosis not present

## 2021-11-06 DIAGNOSIS — I10 Essential (primary) hypertension: Secondary | ICD-10-CM | POA: Diagnosis not present

## 2021-11-06 DIAGNOSIS — N183 Chronic kidney disease, stage 3 unspecified: Secondary | ICD-10-CM | POA: Diagnosis not present

## 2021-11-06 DIAGNOSIS — F329 Major depressive disorder, single episode, unspecified: Secondary | ICD-10-CM | POA: Diagnosis not present

## 2021-11-06 DIAGNOSIS — E7800 Pure hypercholesterolemia, unspecified: Secondary | ICD-10-CM | POA: Diagnosis not present

## 2021-11-06 DIAGNOSIS — I5043 Acute on chronic combined systolic (congestive) and diastolic (congestive) heart failure: Secondary | ICD-10-CM | POA: Diagnosis not present

## 2021-11-09 ENCOUNTER — Other Ambulatory Visit: Payer: Self-pay | Admitting: Cardiovascular Disease

## 2021-11-18 DIAGNOSIS — F419 Anxiety disorder, unspecified: Secondary | ICD-10-CM | POA: Diagnosis not present

## 2021-11-18 DIAGNOSIS — N183 Chronic kidney disease, stage 3 unspecified: Secondary | ICD-10-CM | POA: Diagnosis not present

## 2021-11-18 DIAGNOSIS — Z0000 Encounter for general adult medical examination without abnormal findings: Secondary | ICD-10-CM | POA: Diagnosis not present

## 2021-11-18 DIAGNOSIS — L989 Disorder of the skin and subcutaneous tissue, unspecified: Secondary | ICD-10-CM | POA: Diagnosis not present

## 2021-11-18 DIAGNOSIS — G250 Essential tremor: Secondary | ICD-10-CM | POA: Diagnosis not present

## 2021-11-18 DIAGNOSIS — E7800 Pure hypercholesterolemia, unspecified: Secondary | ICD-10-CM | POA: Diagnosis not present

## 2021-11-18 DIAGNOSIS — I10 Essential (primary) hypertension: Secondary | ICD-10-CM | POA: Diagnosis not present

## 2021-11-18 DIAGNOSIS — G259 Extrapyramidal and movement disorder, unspecified: Secondary | ICD-10-CM | POA: Diagnosis not present

## 2021-11-20 DIAGNOSIS — M1991 Primary osteoarthritis, unspecified site: Secondary | ICD-10-CM | POA: Diagnosis not present

## 2021-11-20 DIAGNOSIS — Z4689 Encounter for fitting and adjustment of other specified devices: Secondary | ICD-10-CM | POA: Diagnosis not present

## 2021-11-20 DIAGNOSIS — N812 Incomplete uterovaginal prolapse: Secondary | ICD-10-CM | POA: Diagnosis not present

## 2021-11-20 DIAGNOSIS — N898 Other specified noninflammatory disorders of vagina: Secondary | ICD-10-CM | POA: Diagnosis not present

## 2021-11-20 DIAGNOSIS — M1120 Other chondrocalcinosis, unspecified site: Secondary | ICD-10-CM | POA: Diagnosis not present

## 2021-11-20 DIAGNOSIS — Z6832 Body mass index (BMI) 32.0-32.9, adult: Secondary | ICD-10-CM | POA: Diagnosis not present

## 2021-11-20 DIAGNOSIS — E669 Obesity, unspecified: Secondary | ICD-10-CM | POA: Diagnosis not present

## 2021-11-20 DIAGNOSIS — Z79899 Other long term (current) drug therapy: Secondary | ICD-10-CM | POA: Diagnosis not present

## 2021-11-20 DIAGNOSIS — M1009 Idiopathic gout, multiple sites: Secondary | ICD-10-CM | POA: Diagnosis not present

## 2021-11-28 DIAGNOSIS — D485 Neoplasm of uncertain behavior of skin: Secondary | ICD-10-CM | POA: Diagnosis not present

## 2021-11-28 DIAGNOSIS — L814 Other melanin hyperpigmentation: Secondary | ICD-10-CM | POA: Diagnosis not present

## 2021-11-28 DIAGNOSIS — L309 Dermatitis, unspecified: Secondary | ICD-10-CM | POA: Diagnosis not present

## 2021-11-28 DIAGNOSIS — L280 Lichen simplex chronicus: Secondary | ICD-10-CM | POA: Diagnosis not present

## 2021-12-16 DIAGNOSIS — D0471 Carcinoma in situ of skin of right lower limb, including hip: Secondary | ICD-10-CM | POA: Diagnosis not present

## 2022-01-01 ENCOUNTER — Ambulatory Visit: Payer: PPO | Attending: Cardiovascular Disease | Admitting: Cardiovascular Disease

## 2022-01-01 ENCOUNTER — Encounter: Payer: Self-pay | Admitting: Cardiovascular Disease

## 2022-01-01 DIAGNOSIS — R600 Localized edema: Secondary | ICD-10-CM

## 2022-01-01 DIAGNOSIS — I2510 Atherosclerotic heart disease of native coronary artery without angina pectoris: Secondary | ICD-10-CM | POA: Diagnosis not present

## 2022-01-01 DIAGNOSIS — I10 Essential (primary) hypertension: Secondary | ICD-10-CM | POA: Diagnosis not present

## 2022-01-01 DIAGNOSIS — E782 Mixed hyperlipidemia: Secondary | ICD-10-CM

## 2022-01-01 DIAGNOSIS — I5042 Chronic combined systolic (congestive) and diastolic (congestive) heart failure: Secondary | ICD-10-CM | POA: Diagnosis not present

## 2022-01-01 MED ORDER — FUROSEMIDE 80 MG PO TABS: ORAL_TABLET | ORAL | 3 refills | Status: DC

## 2022-01-01 NOTE — Assessment & Plan Note (Signed)
History of essential hypertension a blood pressure measured today at 135/55.  She is on losartan and metoprolol.

## 2022-01-01 NOTE — Assessment & Plan Note (Signed)
History of hyperlipidemia on statin therapy with lipid profile performed 11/14/2021 revealing total cholesterol 138, LDL 38 and HDL of 85.

## 2022-01-01 NOTE — Assessment & Plan Note (Signed)
Bilateral lower extremity edema on furosemide 80 mg a day.  She was given a "salty 6 diet" and is aware of salt restriction.  She has 1+ right ankle edema.  I am going to add 40 mg of furosemide in the evening every other day and we will check blood work in 7 to 10 days.

## 2022-01-01 NOTE — Assessment & Plan Note (Signed)
History of nonobstructive CAD by cardiac catheterization performed Dr. Claiborne Billings 07/12/2019 with a 40% proximal LAD stenosis.

## 2022-01-01 NOTE — Patient Instructions (Signed)
Medication Instructions:    -Increase furosemide (lasix) to '40mg'$  in the afternoon every other day.   *If you need a refill on your cardiac medications before your next appointment, please call your pharmacy*   Lab Work: Your physician recommends that you return for lab work in: 7-10 day for BMET  If you have labs (blood work) drawn today and your tests are completely normal, you will receive your results only by: [place] (if you have MyChart) OR A paper copy in the mail If you have any lab test that is abnormal or we need to change your treatment, we will call you to review the results.   Testing/Procedures: Your physician has requested that you have an echocardiogram. Echocardiography is a painless test that uses sound waves to create images of your heart. It provides your doctor with information about the size and shape of your heart and how well your heart's chambers and valves are working. This procedure takes approximately one hour. There are no restrictions for this procedure. This procedure will be done at [address]    Follow-Up: At [facility], you and your health needs are our priority.  As part of our continuing mission to provide you with exceptional heart care, we have created designated Provider Care Teams.  These Care Teams include your primary Cardiologist (physician) and Advanced Practice Providers (APPs -  Physician Assistants and Nurse Practitioners) who all work together to provide you with the care you need, when you need it.  We recommend signing up for the patient portal called "MyChart".  Sign up information is provided on this After Visit Summary.  MyChart is used to connect with patients for Virtual Visits (Telemedicine).  Patients are able to view lab/test results, encounter notes, upcoming appointments, etc.  Non-urgent messages can be sent to your provider as well.   To learn more about what you can do with MyChart, go to  [email or web address].    Your next appointment:   3 month(s)  The format for your next appointment:   In Person  Provider:   Coletta Memos, FNP       Then, Quay Burow, MD will plan to see you again in 12 month(s).

## 2022-01-01 NOTE — Assessment & Plan Note (Signed)
History of chronic systolic and diastolic heart failure with echo performed 06/20/2019 revealing mild decrease in LV function with an EF of 45 to 50% with grade 1 diastolic dysfunction.

## 2022-01-01 NOTE — Progress Notes (Signed)
01/01/2022 [name]   1940-08-17  [id number]  Primary Physician Shirline Frees, MD Primary Cardiologist: Lorretta Harp MD [name], [place]  HPI:  [name] is a 81 y.o.  moderately overweight married Caucasian female mother of 2 children, grandmother of 65 grandchildren referred by Dr. Kenton Kingfisher for evaluation of dyspnea on exertion.  She is retired from being in accounts payable in data entry.  I last saw her in the office 01/31/2020. Her risk factors include treated hypertension and mild untreated hyperlipidemia.  One of her sisters did have CABG.  There is a question that she has had a remote stroke.  She is never had a heart attack.  She denies chest pain.  She does have GERD.  She has had right lower extremity DVT 10 years ago complicated by pulmonary embolism on Xarelto.  She says over the last several years she said progressive dyspnea on exertion.   She was admitted to the hospital 07/09/2019 for 4 days and heart failure.  She underwent right and left heart cath by Dr. Claiborne Billings revealing almost 40% proximal LAD lesion with elevated right atrial pressure, pulmonary artery pressure, wedge pressure and LVEDP.  She was diuresed and currently weighs 172 pounds down from 188 pounds on 06/03/2019.  She is aware of salt restriction.  She is on furosemide 80 mg a day and currently denies shortness of breath.   She does have a history of thrombophilia with prothrombin gene mutation.  She is had DVT in the past as well as pulmonary emboli.  She has been on Xarelto several times in the past most recently 2 years ago.  She had successful right total knee replacement by Dr. Maxie Better.   Since I saw her 2 years ago she has had a right shoulder replacement as well.  She is also had surgery for removal of skin cancer on her right leg which is currently wrapped.  She saw Coletta Memos, FNP in the office 5//23 complaining of some lower extreme edema.  Diuretics were briefly adjusted.  She denies  chest pain or shortness of breath.   Current Meds  Medication Sig   ALBUTEROL IN 1 tab   allopurinol (ZYLOPRIM) 300 MG tablet Take 300 mg by mouth in the morning.   aspirin EC 81 MG tablet Take 162 mg by mouth in the morning. Swallow whole.   atorvastatin (LIPITOR) 40 MG tablet Take 1 tablet (40 mg total) by mouth daily at 6 PM.   Biotin 1000 MCG tablet Take 1,000 mcg by mouth in the morning.   Calcium Carb-Cholecalciferol (CALCIUM 600+D3 PO) Take 1 tablet by mouth in the morning and at bedtime.   cholecalciferol (VITAMIN D) 25 MCG (1000 UNIT) tablet Take 1,000 Units by mouth in the morning and at bedtime.   citalopram (CELEXA) 20 MG tablet Take 20 mg by mouth at bedtime.    colchicine 0.6 MG tablet Take 0.6 mg by mouth daily as needed (gout flares).   cyclobenzaprine (FLEXERIL) 10 MG tablet Take 10 mg by mouth 3 (three) times daily as needed for muscle spasms.   cycloSPORINE (RESTASIS) 0.05 % ophthalmic emulsion Place 1 drop into both eyes 2 (two) times daily.   ferrous sulfate 325 (65 FE) MG EC tablet Take 325 mg by mouth in the morning.   furosemide (LASIX) 80 MG tablet Take 1 tablet (80 mg total) by mouth daily.   Hypromellose (ARTIFICIAL TEARS OP) Place 1 drop into both eyes 3 (three) times  daily as needed (dry eyes).   losartan (COZAAR) 50 MG tablet Take 1 tablet (50 mg total) by mouth daily.   metoprolol succinate (TOPROL-XL) 25 MG 24 hr tablet TAKE ONE TABLET BY MOUTH ONCE DAILY   Multiple Vitamin (MULTIVITAMIN WITH MINERALS) TABS tablet Take 1 tablet by mouth in the morning. Centrum Silver (NO IRON)   Omega-3 Fatty Acids (FISH OIL PO) Take 1 capsule by mouth in the morning.   omeprazole (PRILOSEC) 20 MG capsule Take 20 mg by mouth daily before breakfast.    ondansetron (ZOFRAN) 4 MG tablet Take by mouth.   oxyCODONE-acetaminophen (PERCOCET/ROXICET) 5-325 MG tablet Take 1 tablet by mouth daily as needed (pain.).   polycarbophil (FIBERCON) 625 MG tablet Take 625 mg by mouth in the  morning and at bedtime.   potassium chloride SA (KLOR-CON M) 20 MEQ tablet Take with furosemide on Friday and Saturday for two doses.   pramipexole (MIRAPEX) 0.5 MG tablet Take 1 mg by mouth every evening.   temazepam (RESTORIL) 15 MG capsule Take 1 capsule (15 mg total) by mouth at bedtime.   Turmeric Curcumin 500 MG CAPS Take 500 mg by mouth at bedtime.    vitamin E 180 MG (400 UNITS) capsule Take 400 Units by mouth at bedtime.     Allergies  Allergen Reactions   Morphine And Related Other (See Comments)    LARGER DOSES OF MORPHINE CAUSES BODY TWITCHING   Penicillins Anaphylaxis    Childhood reaction Has patient had a PCN reaction causing immediate rash, facial/tongue/throat swelling, SOB or lightheadedness with hypotension: Yes Has patient had a PCN reaction causing severe rash involving mucus membranes or skin necrosis: Unknown Has patient had a PCN reaction that required hospitalization: No Has patient had a PCN reaction occurring within the last 10 years: no (5 or 81 yrs old) If all of the above answers are "NO", then may proceed with Cephalosporin use.    Baclofen     Other reaction(s): Unknown   Celecoxib     Other reaction(s): Unknown   Doxycycline Other (See Comments)   Levofloxacin     Other reaction(s): abnl taste   Penicillin G Benzathine Other (See Comments)   Pneumococcal Vac Polyvalent     Other reaction(s): local reaction   Sulfamethoxazole Other (See Comments)   Tetanus Toxoid, Adsorbed     Other reaction(s): local reaction   Sulfa Antibiotics Other (See Comments)    Unknown childhood reaction    Social History   Socioeconomic History   Marital status: Married    Spouse name: Not on file   Number of children: Not on file   Years of education: Not on file   Highest education level: Not on file  Occupational History   Not on file  Tobacco Use   Smoking status: Never   Smokeless tobacco: Never   Tobacco comments:    never used tobacco  Vaping Use    Vaping Use: Never used  Substance and Sexual Activity   Alcohol use: No    Alcohol/week: 0.0 standard drinks of alcohol   Drug use: No   Sexual activity: Not Currently  Other Topics Concern   Not on file  Social History Narrative   Not on file   Social Determinants of Health   Financial Resource Strain: Not on file  Food Insecurity: Not on file  Transportation Needs: Not on file  Physical Activity: Not on file  Stress: Not on file  Social Connections: Not on file  Intimate Partner  Violence: Not on file     Review of Systems: General: negative for chills, fever, night sweats or weight changes.  Cardiovascular: negative for chest pain, dyspnea on exertion, edema, orthopnea, palpitations, paroxysmal nocturnal dyspnea or shortness of breath Dermatological: negative for rash Respiratory: negative for cough or wheezing Urologic: negative for hematuria Abdominal: negative for nausea, vomiting, diarrhea, bright red blood per rectum, melena, or hematemesis Neurologic: negative for visual changes, syncope, or dizziness All other systems reviewed and are otherwise negative except as noted above.    Blood pressure (!) 135/55, pulse 67, height '5\' 2"'$  (1.575 m), weight 185 lb 3.2 oz (84 kg), SpO2 99 %.  General appearance: alert and no distress Neck: no adenopathy, no carotid bruit, no JVD, supple, symmetrical, trachea midline, and thyroid not enlarged, symmetric, no tenderness/mass/nodules Lungs: clear to auscultation bilaterally Heart: regular rate and rhythm, S1, S2 normal, no murmur, click, rub or gallop Extremities: 1+ right ankle edema Pulses: 2+ and symmetric Skin: Skin color, texture, turgor normal. No rashes or lesions Neurologic: Grossly normal  EKG not performed today  ASSESSMENT AND PLAN:   HTN (hypertension) History of essential hypertension a blood pressure measured today at 135/55.  She is on losartan and metoprolol.  Bilateral lower extremity edema Bilateral lower  extremity edema on furosemide 80 mg a day.  She was given a "salty 6 diet" and is aware of salt restriction.  She has 1+ right ankle edema.  I am going to add 40 mg of furosemide in the evening every other day and we will check blood work in 7 to 10 days.  CHF (congestive heart failure) (HCC) History of chronic systolic and diastolic heart failure with echo performed 06/20/2019 revealing mild decrease in LV function with an EF of 45 to 50% with grade 1 diastolic dysfunction.  Hyperlipidemia History of hyperlipidemia on statin therapy with lipid profile performed 11/14/2021 revealing total cholesterol 138, LDL 38 and HDL of 85.  Coronary artery disease History of nonobstructive CAD by cardiac catheterization performed Dr. Claiborne Billings 07/12/2019 with a 40% proximal LAD stenosis.     Lorretta Harp MD FACP,FACC,FAHA, [facility] 01/01/2022 10:36 AM

## 2022-01-07 DIAGNOSIS — Z4689 Encounter for fitting and adjustment of other specified devices: Secondary | ICD-10-CM | POA: Diagnosis not present

## 2022-01-07 DIAGNOSIS — N816 Rectocele: Secondary | ICD-10-CM | POA: Diagnosis not present

## 2022-01-07 DIAGNOSIS — N814 Uterovaginal prolapse, unspecified: Secondary | ICD-10-CM | POA: Diagnosis not present

## 2022-01-07 DIAGNOSIS — N8110 Cystocele, unspecified: Secondary | ICD-10-CM | POA: Diagnosis not present

## 2022-01-07 DIAGNOSIS — N898 Other specified noninflammatory disorders of vagina: Secondary | ICD-10-CM | POA: Diagnosis not present

## 2022-01-07 DIAGNOSIS — R39198 Other difficulties with micturition: Secondary | ICD-10-CM | POA: Diagnosis not present

## 2022-01-08 ENCOUNTER — Encounter: Payer: Self-pay | Admitting: Hematology & Oncology

## 2022-01-08 DIAGNOSIS — N183 Chronic kidney disease, stage 3 unspecified: Secondary | ICD-10-CM | POA: Diagnosis not present

## 2022-01-08 DIAGNOSIS — I10 Essential (primary) hypertension: Secondary | ICD-10-CM | POA: Diagnosis not present

## 2022-01-08 DIAGNOSIS — E7800 Pure hypercholesterolemia, unspecified: Secondary | ICD-10-CM | POA: Diagnosis not present

## 2022-01-08 DIAGNOSIS — I5043 Acute on chronic combined systolic (congestive) and diastolic (congestive) heart failure: Secondary | ICD-10-CM | POA: Diagnosis not present

## 2022-01-14 ENCOUNTER — Inpatient Hospital Stay: Payer: PPO | Attending: Family

## 2022-01-14 ENCOUNTER — Encounter: Payer: Self-pay | Admitting: Hematology & Oncology

## 2022-01-14 ENCOUNTER — Inpatient Hospital Stay (HOSPITAL_BASED_OUTPATIENT_CLINIC_OR_DEPARTMENT_OTHER): Payer: PPO | Admitting: Family

## 2022-01-14 ENCOUNTER — Ambulatory Visit (HOSPITAL_COMMUNITY): Payer: PPO | Attending: Cardiovascular Disease

## 2022-01-14 ENCOUNTER — Encounter: Payer: Self-pay | Admitting: Family

## 2022-01-14 ENCOUNTER — Other Ambulatory Visit: Payer: Self-pay

## 2022-01-14 VITALS — BP 113/47 | HR 63 | Temp 97.8°F | Resp 18 | Ht 63.0 in | Wt 181.1 lb

## 2022-01-14 DIAGNOSIS — I82401 Acute embolism and thrombosis of unspecified deep veins of right lower extremity: Secondary | ICD-10-CM

## 2022-01-14 DIAGNOSIS — I10 Essential (primary) hypertension: Secondary | ICD-10-CM

## 2022-01-14 DIAGNOSIS — R600 Localized edema: Secondary | ICD-10-CM | POA: Diagnosis not present

## 2022-01-14 DIAGNOSIS — D500 Iron deficiency anemia secondary to blood loss (chronic): Secondary | ICD-10-CM | POA: Diagnosis not present

## 2022-01-14 DIAGNOSIS — I2510 Atherosclerotic heart disease of native coronary artery without angina pectoris: Secondary | ICD-10-CM | POA: Diagnosis not present

## 2022-01-14 DIAGNOSIS — Z86718 Personal history of other venous thrombosis and embolism: Secondary | ICD-10-CM | POA: Diagnosis not present

## 2022-01-14 DIAGNOSIS — E782 Mixed hyperlipidemia: Secondary | ICD-10-CM

## 2022-01-14 DIAGNOSIS — D6852 Prothrombin gene mutation: Secondary | ICD-10-CM

## 2022-01-14 DIAGNOSIS — Z7982 Long term (current) use of aspirin: Secondary | ICD-10-CM | POA: Diagnosis not present

## 2022-01-14 DIAGNOSIS — I5042 Chronic combined systolic (congestive) and diastolic (congestive) heart failure: Secondary | ICD-10-CM | POA: Diagnosis not present

## 2022-01-14 LAB — CBC WITH DIFFERENTIAL (CANCER CENTER ONLY)
Abs Immature Granulocytes: 0.01 10*3/uL (ref 0.00–0.07)
Basophils Absolute: 0 10*3/uL (ref 0.0–0.1)
Basophils Relative: 1 %
Eosinophils Absolute: 0.2 10*3/uL (ref 0.0–0.5)
Eosinophils Relative: 5 %
HCT: 34 % — ABNORMAL LOW (ref 36.0–46.0)
Hemoglobin: 11 g/dL — ABNORMAL LOW (ref 12.0–15.0)
Immature Granulocytes: 0 %
Lymphocytes Relative: 30 %
Lymphs Abs: 1.4 10*3/uL (ref 0.7–4.0)
MCH: 29.7 pg (ref 26.0–34.0)
MCHC: 32.4 g/dL (ref 30.0–36.0)
MCV: 91.9 fL (ref 80.0–100.0)
Monocytes Absolute: 0.4 10*3/uL (ref 0.1–1.0)
Monocytes Relative: 9 %
Neutro Abs: 2.6 10*3/uL (ref 1.7–7.7)
Neutrophils Relative %: 55 %
Platelet Count: 153 10*3/uL (ref 150–400)
RBC: 3.7 MIL/uL — ABNORMAL LOW (ref 3.87–5.11)
RDW: 14.4 % (ref 11.5–15.5)
WBC Count: 4.7 10*3/uL (ref 4.0–10.5)
nRBC: 0 % (ref 0.0–0.2)

## 2022-01-14 LAB — CMP (CANCER CENTER ONLY)
ALT: 23 U/L (ref 0–44)
AST: 39 U/L (ref 15–41)
Albumin: 4.2 g/dL (ref 3.5–5.0)
Alkaline Phosphatase: 90 U/L (ref 38–126)
Anion gap: 11 (ref 5–15)
BUN: 52 mg/dL — ABNORMAL HIGH (ref 8–23)
CO2: 29 mmol/L (ref 22–32)
Calcium: 10.5 mg/dL — ABNORMAL HIGH (ref 8.9–10.3)
Chloride: 100 mmol/L (ref 98–111)
Creatinine: 1.59 mg/dL — ABNORMAL HIGH (ref 0.44–1.00)
GFR, Estimated: 32 mL/min — ABNORMAL LOW (ref >60)
Glucose, Bld: 97 mg/dL (ref 70–99)
Potassium: 3.9 mmol/L (ref 3.5–5.1)
Sodium: 140 mmol/L (ref 135–145)
Total Bilirubin: 0.6 mg/dL (ref 0.3–1.2)
Total Protein: 7.2 g/dL (ref 6.5–8.1)

## 2022-01-14 LAB — RETICULOCYTES
Immature Retic Fract: 15.5 % (ref 2.3–15.9)
RBC.: 3.71 MIL/uL — ABNORMAL LOW (ref 3.87–5.11)
Retic Count, Absolute: 62.7 10*3/uL (ref 19.0–186.0)
Retic Ct Pct: 1.7 % (ref 0.4–3.1)

## 2022-01-14 LAB — FERRITIN: Ferritin: 69 ng/mL (ref 11–307)

## 2022-01-14 LAB — ECHOCARDIOGRAM COMPLETE
Area-P 1/2: 2.85 cm2
S' Lateral: 3.9 cm

## 2022-01-14 NOTE — Progress Notes (Signed)
Hematology and Oncology Follow Up Visit  [name] [id number] 1940-07-21 81 y.o. 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 She is doing well but states that she tripped and fell while cleaning their camper. Thankfully she states that she was not injured.  No other falls or syncope reported.  She had a small skin cancer removed from the back of her right leg. She is unsure of the type but states that they "got it all". She is doing well on her 2 baby aspirin daily. No bleeding or petechiae. She does bruise easily on her arms.  No fever, chills, n/v, cough, rash, dizziness, chest pain, palpitations, abdominal pain or changes in bowel or bladder habits.  She notes mild SOB with over exertion.  She was able to see urology and they are determining a plan to treat the anterior vaginal wall prolapse.  No swelling, numbness or tingling in her extremities.  Appetite and hydration are good. Weight is stable at 181 lbs.   ECOG Performance Status: 1 - Symptomatic but completely ambulatory  Medications:  Allergies as of 01/14/2022       Reactions   Morphine And Related Other (See Comments)   LARGER DOSES OF MORPHINE CAUSES BODY TWITCHING   Penicillins Anaphylaxis   Childhood reaction Has patient had a PCN reaction causing immediate rash, facial/tongue/throat swelling, SOB or lightheadedness with hypotension: Yes Has patient had a PCN reaction causing severe rash involving mucus membranes or skin necrosis: Unknown Has patient had a PCN reaction that required hospitalization: No Has patient had a PCN reaction occurring within the last 10 years: no (5 or 81 yrs old) If all of  the above answers are "NO", then may proceed with Cephalosporin use.   Baclofen Other (See Comments)   Other reaction(s): Unknown   Celecoxib Other (See Comments)   Other reaction(s): Unknown   Doxycycline Other (See Comments)   Levofloxacin Other (See Comments)   Other reaction(s): abnl taste   Penicillin G Benzathine Other (See Comments)   Pneumococcal Vac Polyvalent Other (See Comments)   Other reaction(s): local reaction   Sulfa Antibiotics Other (See Comments)   Unknown childhood reaction   Sulfamethoxazole Other (See Comments)   Tetanus Toxoid, Adsorbed Other (See Comments)   Other reaction(s): local reaction        Medication List        Accurate as of January 14, 2022  1:57 PM. If you have any questions, ask your nurse or doctor.          ALBUTEROL IN 1 tab   allopurinol 300 MG tablet Commonly known as: ZYLOPRIM Take 300 mg by mouth in the morning.   ARTIFICIAL TEARS OP Place 1 drop into both eyes 3 (three) times daily as needed (dry eyes).   aspirin EC 81 MG tablet Take 162 mg by mouth in the morning. Swallow whole.   atorvastatin 40 MG tablet Commonly known as: LIPITOR Take 1 tablet (40 mg total) by mouth daily at 6 PM.   Biotin 1000 MCG tablet Take 1,000 mcg by mouth in the morning.   CALCIUM 600+D3 PO Take 1 tablet by mouth in  the morning and at bedtime.   cholecalciferol 25 MCG (1000 UNIT) tablet Commonly known as: VITAMIN D3 Take 1,000 Units by mouth in the morning and at bedtime.   citalopram 20 MG tablet Commonly known as: CELEXA Take 20 mg by mouth at bedtime.   colchicine 0.6 MG tablet Take 0.6 mg by mouth daily as needed (gout flares).   cyclobenzaprine 10 MG tablet Commonly known as: FLEXERIL Take 10 mg by mouth 3 (three) times daily as needed for muscle spasms.   cycloSPORINE 0.05 % ophthalmic emulsion Commonly known as: RESTASIS Place 1 drop into both eyes 2 (two) times daily.   ferrous sulfate 325 (65 FE) MG EC  tablet Take 325 mg by mouth in the morning.   FISH OIL PO Take 1 capsule by mouth in the morning.   furosemide 80 MG tablet Commonly known as: Lasix Take 1 tablet (80 mg total) by mouth every morning AND 0.5 tablets (40 mg total) every other day.   losartan 50 MG tablet Commonly known as: COZAAR Take 1 tablet (50 mg total) by mouth daily.   metoprolol succinate 25 MG 24 hr tablet Commonly known as: TOPROL-XL TAKE ONE TABLET BY MOUTH ONCE DAILY   multivitamin with minerals Tabs tablet Take 1 tablet by mouth in the morning. Centrum Silver (NO IRON)   omeprazole 20 MG capsule Commonly known as: PRILOSEC Take 20 mg by mouth daily before breakfast.   ondansetron 4 MG tablet Commonly known as: ZOFRAN Take by mouth.   oxyCODONE-acetaminophen 5-325 MG tablet Commonly known as: PERCOCET/ROXICET Take 1 tablet by mouth daily as needed (pain.).   polycarbophil 625 MG tablet Commonly known as: FIBERCON Take 625 mg by mouth in the morning and at bedtime.   potassium chloride SA 20 MEQ tablet Commonly known as: KLOR-CON M Take with furosemide on Friday and Saturday for two doses.   pramipexole 0.5 MG tablet Commonly known as: MIRAPEX Take 1 mg by mouth every evening.   temazepam 15 MG capsule Commonly known as: RESTORIL Take 1 capsule (15 mg total) by mouth at bedtime.   Turmeric Curcumin 500 MG Caps Take 500 mg by mouth at bedtime.   vitamin E 180 MG (400 UNITS) capsule Take 400 Units by mouth at bedtime.        Allergies:  Allergies  Allergen Reactions   Morphine And Related Other (See Comments)    LARGER DOSES OF MORPHINE CAUSES BODY TWITCHING   Penicillins Anaphylaxis    Childhood reaction Has patient had a PCN reaction causing immediate rash, facial/tongue/throat swelling, SOB or lightheadedness with hypotension: Yes Has patient had a PCN reaction causing severe rash involving mucus membranes or skin necrosis: Unknown Has patient had a PCN reaction that  required hospitalization: No Has patient had a PCN reaction occurring within the last 10 years: no (5 or 81 yrs old) If all of the above answers are "NO", then may proceed with Cephalosporin use.    Baclofen Other (See Comments)    Other reaction(s): Unknown   Celecoxib Other (See Comments)    Other reaction(s): Unknown   Doxycycline Other (See Comments)   Levofloxacin Other (See Comments)    Other reaction(s): abnl taste   Penicillin G Benzathine Other (See Comments)   Pneumococcal Vac Polyvalent Other (See Comments)    Other reaction(s): local reaction   Sulfa Antibiotics Other (See Comments)    Unknown childhood reaction   Sulfamethoxazole Other (See Comments)   Tetanus Toxoid, Adsorbed Other (See Comments)  Other reaction(s): local reaction    Past Medical History, Surgical history, Social history, and Family History were reviewed and updated.  Review of Systems: All other 10 point review of systems is negative.   Physical Exam:  vitals were not taken for this visit.   Wt Readings from Last 3 Encounters:  01/01/22 185 lb 3.2 oz (84 kg)  08/29/21 182 lb 6.4 oz (82.7 kg)  07/23/21 168 lb (76.2 kg)    Ocular: Sclerae unicteric, pupils equal, round and reactive to light Ear-nose-throat: Oropharynx clear, dentition fair Lymphatic: No cervical or supraclavicular adenopathy Lungs no rales or rhonchi, good excursion bilaterally Heart regular rate and rhythm, no murmur appreciated Abd soft, nontender, positive bowel sounds MSK no focal spinal tenderness, no joint edema Neuro: non-focal, well-oriented, appropriate affect Breasts: Deferred   Lab Results  Component Value Date   WBC 4.7 01/14/2022   HGB 11.0 (L) 01/14/2022   HCT 34.0 (L) 01/14/2022   MCV 91.9 01/14/2022   PLT 153 01/14/2022   Lab Results  Component Value Date   FERRITIN 92 07/23/2021   IRON 78 07/23/2021   TIBC 262 07/23/2021   UIBC 184 07/23/2021   IRONPCTSAT 30 07/23/2021   Lab Results   Component Value Date   RETICCTPCT 1.7 01/14/2022   RBC 3.71 (L) 01/14/2022   RETICCTABS 39.7 05/11/2014   No results found for: "KPAFRELGTCHN", "LAMBDASER", "KAPLAMBRATIO" No results found for: "IGGSERUM", "IGA", "IGMSERUM" No results found for: "TOTALPROTELP", "ALBUMINELP", "A1GS", "A2GS", "BETS", "BETA2SER", "GAMS", "MSPIKE", "SPEI"   Chemistry      Component Value Date/Time   NA 141 09/10/2021 1240   NA 144 04/13/2017 1134   NA 142 07/28/2016 1144   K 4.1 09/10/2021 1240   K 3.3 04/13/2017 1134   K 3.6 07/28/2016 1144   CL 99 09/10/2021 1240   CL 97 (L) 04/13/2017 1134   CO2 27 09/10/2021 1240   CO2 32 04/13/2017 1134   CO2 33 (H) 07/28/2016 1144   BUN 32 (H) 09/10/2021 1240   BUN 18 04/13/2017 1134   BUN 22.6 07/28/2016 1144   CREATININE 1.20 (H) 09/10/2021 1240   CREATININE 1.40 (H) 07/23/2021 1010   CREATININE 1.0 04/13/2017 1134   CREATININE 1.0 07/28/2016 1144      Component Value Date/Time   CALCIUM 10.0 09/10/2021 1240   CALCIUM 10.1 04/13/2017 1134   CALCIUM 9.5 07/28/2016 1144   ALKPHOS 99 07/23/2021 1010   ALKPHOS 103 (H) 04/13/2017 1134   ALKPHOS 84 07/28/2016 1144   AST 26 07/23/2021 1010   AST 24 07/28/2016 1144   ALT 16 07/23/2021 1010   ALT 27 04/13/2017 1134   ALT 17 07/28/2016 1144   BILITOT 0.5 07/23/2021 1010   BILITOT 0.41 07/28/2016 1144       Impression and Plan: [name] is a very pleasant 81 yo caucasian female with a chronic DVT in the right lower extremity.  Continue to baby aspirin daily.  Iron studies pending.  Follow-up in 6 months.   Lottie Dawson, NP 9/19/20231:57 PM

## 2022-01-15 LAB — IRON AND IRON BINDING CAPACITY (CC-WL,HP ONLY)
Iron: 106 ug/dL (ref 28–170)
Saturation Ratios: 41 % — ABNORMAL HIGH (ref 10.4–31.8)
TIBC: 256 ug/dL (ref 250–450)
UIBC: 150 ug/dL (ref 148–442)

## 2022-02-12 DIAGNOSIS — S91001A Unspecified open wound, right ankle, initial encounter: Secondary | ICD-10-CM | POA: Diagnosis not present

## 2022-02-17 DIAGNOSIS — N183 Chronic kidney disease, stage 3 unspecified: Secondary | ICD-10-CM | POA: Diagnosis not present

## 2022-02-17 DIAGNOSIS — I10 Essential (primary) hypertension: Secondary | ICD-10-CM | POA: Diagnosis not present

## 2022-02-17 DIAGNOSIS — I5043 Acute on chronic combined systolic (congestive) and diastolic (congestive) heart failure: Secondary | ICD-10-CM | POA: Diagnosis not present

## 2022-02-17 DIAGNOSIS — E7800 Pure hypercholesterolemia, unspecified: Secondary | ICD-10-CM | POA: Diagnosis not present

## 2022-02-20 ENCOUNTER — Other Ambulatory Visit: Payer: Self-pay | Admitting: Physician Assistant

## 2022-02-20 ENCOUNTER — Ambulatory Visit
Admission: RE | Admit: 2022-02-20 | Discharge: 2022-02-20 | Disposition: A | Discharge status: Home / Self Care | Payer: PPO | Source: Ambulatory Visit | Attending: Physician Assistant | Admitting: Physician Assistant

## 2022-02-20 DIAGNOSIS — S8991XD Unspecified injury of right lower leg, subsequent encounter: Secondary | ICD-10-CM

## 2022-02-20 DIAGNOSIS — L03115 Cellulitis of right lower limb: Secondary | ICD-10-CM | POA: Diagnosis not present

## 2022-02-20 DIAGNOSIS — R600 Localized edema: Secondary | ICD-10-CM | POA: Diagnosis not present

## 2022-02-25 DIAGNOSIS — S81801A Unspecified open wound, right lower leg, initial encounter: Secondary | ICD-10-CM | POA: Diagnosis not present

## 2022-03-11 DIAGNOSIS — N816 Rectocele: Secondary | ICD-10-CM | POA: Diagnosis not present

## 2022-03-11 DIAGNOSIS — N812 Incomplete uterovaginal prolapse: Secondary | ICD-10-CM | POA: Diagnosis not present

## 2022-03-11 DIAGNOSIS — N8110 Cystocele, unspecified: Secondary | ICD-10-CM | POA: Diagnosis not present

## 2022-03-11 DIAGNOSIS — N813 Complete uterovaginal prolapse: Secondary | ICD-10-CM | POA: Diagnosis not present

## 2022-03-11 DIAGNOSIS — Z4689 Encounter for fitting and adjustment of other specified devices: Secondary | ICD-10-CM | POA: Diagnosis not present

## 2022-03-11 DIAGNOSIS — N949 Unspecified condition associated with female genital organs and menstrual cycle: Secondary | ICD-10-CM | POA: Diagnosis not present

## 2022-03-11 DIAGNOSIS — R82998 Other abnormal findings in urine: Secondary | ICD-10-CM | POA: Diagnosis not present

## 2022-03-11 DIAGNOSIS — R8290 Unspecified abnormal findings in urine: Secondary | ICD-10-CM | POA: Diagnosis not present

## 2022-03-18 DIAGNOSIS — S81801A Unspecified open wound, right lower leg, initial encounter: Secondary | ICD-10-CM | POA: Diagnosis not present

## 2022-03-24 ENCOUNTER — Emergency Department (HOSPITAL_BASED_OUTPATIENT_CLINIC_OR_DEPARTMENT_OTHER): Payer: PPO

## 2022-03-24 ENCOUNTER — Other Ambulatory Visit: Payer: Self-pay

## 2022-03-24 ENCOUNTER — Emergency Department (HOSPITAL_BASED_OUTPATIENT_CLINIC_OR_DEPARTMENT_OTHER)
Admission: EM | Admit: 2022-03-24 | Discharge: 2022-03-24 | Disposition: A | Discharge status: Home / Self Care | Payer: PPO | Attending: Emergency Medicine | Admitting: Emergency Medicine

## 2022-03-24 ENCOUNTER — Encounter (HOSPITAL_BASED_OUTPATIENT_CLINIC_OR_DEPARTMENT_OTHER): Payer: Self-pay | Admitting: Urology

## 2022-03-24 DIAGNOSIS — R0602 Shortness of breath: Secondary | ICD-10-CM | POA: Diagnosis not present

## 2022-03-24 DIAGNOSIS — R519 Headache, unspecified: Secondary | ICD-10-CM | POA: Diagnosis not present

## 2022-03-24 DIAGNOSIS — I509 Heart failure, unspecified: Secondary | ICD-10-CM | POA: Diagnosis not present

## 2022-03-24 DIAGNOSIS — R0600 Dyspnea, unspecified: Secondary | ICD-10-CM | POA: Diagnosis not present

## 2022-03-24 DIAGNOSIS — K449 Diaphragmatic hernia without obstruction or gangrene: Secondary | ICD-10-CM | POA: Diagnosis not present

## 2022-03-24 DIAGNOSIS — I2510 Atherosclerotic heart disease of native coronary artery without angina pectoris: Secondary | ICD-10-CM | POA: Diagnosis not present

## 2022-03-24 DIAGNOSIS — R112 Nausea with vomiting, unspecified: Secondary | ICD-10-CM | POA: Diagnosis not present

## 2022-03-24 DIAGNOSIS — I110 Hypertensive heart disease with heart failure: Secondary | ICD-10-CM | POA: Diagnosis not present

## 2022-03-24 DIAGNOSIS — Z01812 Encounter for preprocedural laboratory examination: Secondary | ICD-10-CM | POA: Diagnosis not present

## 2022-03-24 DIAGNOSIS — Z7982 Long term (current) use of aspirin: Secondary | ICD-10-CM | POA: Diagnosis not present

## 2022-03-24 DIAGNOSIS — D649 Anemia, unspecified: Secondary | ICD-10-CM | POA: Diagnosis not present

## 2022-03-24 DIAGNOSIS — R34 Anuria and oliguria: Secondary | ICD-10-CM | POA: Diagnosis not present

## 2022-03-24 DIAGNOSIS — Z79899 Other long term (current) drug therapy: Secondary | ICD-10-CM | POA: Diagnosis not present

## 2022-03-24 DIAGNOSIS — Z0189 Encounter for other specified special examinations: Secondary | ICD-10-CM

## 2022-03-24 DIAGNOSIS — R140 Abdominal distension (gaseous): Secondary | ICD-10-CM | POA: Diagnosis not present

## 2022-03-24 LAB — COMPREHENSIVE METABOLIC PANEL
ALT: 20 U/L (ref 0–44)
AST: 30 U/L (ref 15–41)
Albumin: 3.5 g/dL (ref 3.5–5.0)
Alkaline Phosphatase: 89 U/L (ref 38–126)
Anion gap: 12 (ref 5–15)
BUN: 28 mg/dL — ABNORMAL HIGH (ref 8–23)
CO2: 28 mmol/L (ref 22–32)
Calcium: 9.2 mg/dL (ref 8.9–10.3)
Chloride: 100 mmol/L (ref 98–111)
Creatinine, Ser: 1.18 mg/dL — ABNORMAL HIGH (ref 0.44–1.00)
GFR, Estimated: 46 mL/min — ABNORMAL LOW (ref >60)
Glucose, Bld: 91 mg/dL (ref 70–99)
Potassium: 3.5 mmol/L (ref 3.5–5.1)
Sodium: 140 mmol/L (ref 135–145)
Total Bilirubin: 0.6 mg/dL (ref 0.3–1.2)
Total Protein: 6.9 g/dL (ref 6.5–8.1)

## 2022-03-24 LAB — CBC WITH DIFFERENTIAL/PLATELET
Abs Immature Granulocytes: 0.01 10*3/uL (ref 0.00–0.07)
Basophils Absolute: 0.1 10*3/uL (ref 0.0–0.1)
Basophils Relative: 1 %
Eosinophils Absolute: 0.3 10*3/uL (ref 0.0–0.5)
Eosinophils Relative: 5 %
HCT: 34.6 % — ABNORMAL LOW (ref 36.0–46.0)
Hemoglobin: 10.8 g/dL — ABNORMAL LOW (ref 12.0–15.0)
Immature Granulocytes: 0 %
Lymphocytes Relative: 27 %
Lymphs Abs: 1.4 10*3/uL (ref 0.7–4.0)
MCH: 28.6 pg (ref 26.0–34.0)
MCHC: 31.2 g/dL (ref 30.0–36.0)
MCV: 91.5 fL (ref 80.0–100.0)
Monocytes Absolute: 0.5 10*3/uL (ref 0.1–1.0)
Monocytes Relative: 9 %
Neutro Abs: 3.1 10*3/uL (ref 1.7–7.7)
Neutrophils Relative %: 58 %
Platelets: 155 10*3/uL (ref 150–400)
RBC: 3.78 MIL/uL — ABNORMAL LOW (ref 3.87–5.11)
RDW: 14.7 % (ref 11.5–15.5)
WBC: 5.3 10*3/uL (ref 4.0–10.5)
nRBC: 0 % (ref 0.0–0.2)

## 2022-03-24 LAB — BRAIN NATRIURETIC PEPTIDE: B Natriuretic Peptide: 338.1 pg/mL — ABNORMAL HIGH (ref 0.0–100.0)

## 2022-03-24 NOTE — ED Provider Notes (Signed)
[name] Provider Note   CSN: [id number] Arrival date & time: 03/24/22  1246     History  Chief Complaint  Patient presents with   Lab per PCP    [name] is a 81 y.o. female with Hx of IDA, chronic DVT of right LE, hemochromatosis, HTN, LBBB, CHF, CAD, hyperlipidemia, and recent prolapse of anterior vaginal wall.  Presenting to the ED today due to "worried about kidney labs".  Patient states earlier today, she felt increased dyspnea on exertion when bending over to put on her shoes.  States this has been worsening over the last 1 to 6 months.  States her family member noticed during the holidays that she was breathing harder, and had not noticed herself.  Now has been paying close attention to her breathing.  Also was noted that she has gained 2-3 pounds over the last week after the holidays, states she good amount of food that probably had a lot of sodium.  Then was trying to pass urine on the toilet today, noted she became increasingly short of breath with exertion trying to urinate, and became concerned.  Called her primary care and urologist, however was unable to get through, and hung up.  Then called her cardiologist office, spoke to the nurse, and was recommended to come to the ED to have her "kidney function checked".  States she has been on her Lasix for years, about 4 to 6 months ago was increased from 80 mg daily to 80 mg daily with an extra 40 mg every other day.  Denies chest pain, active shortness of breath, fever, chills, worsening swelling of the lower extremities.  Still able to void, denies burning or discomfort.  Denies new numbness, tingling, or weakness of the extremities.  No other complaints at this time.  On baby aspirin daily.  Per record review, 03/11/2022 seen by urology for initial consultation of the recent prolapse of anterior vaginal wall.  Known prolapse for 10+ years however recently had issues voiding.  Plan to follow-up in  4 weeks for reevaluation.  Was recommended to start a low-dose estrogen cream.  The history is provided by the patient and medical records.      Home Medications Prior to Admission medications   Medication Sig Start Date End Date Taking? Authorizing Provider  ALBUTEROL IN 1 tab    [provider]  allopurinol (ZYLOPRIM) 300 MG tablet Take 300 mg by mouth in the morning. 11/12/20   [provider]  aspirin EC 81 MG tablet Take 162 mg by mouth in the morning. Swallow whole.    [provider]  atorvastatin (LIPITOR) 40 MG tablet Take 1 tablet (40 mg total) by mouth daily at 6 PM. 07/13/19   Oswald Hillock, MD  Biotin 1000 MCG tablet Take 1,000 mcg by mouth in the morning.    [provider]  Calcium Carb-Cholecalciferol (CALCIUM 600+D3 PO) Take 1 tablet by mouth in the morning and at bedtime.    [provider]  cholecalciferol (VITAMIN D) 25 MCG (1000 UNIT) tablet Take 1,000 Units by mouth in the morning and at bedtime.    [provider]  citalopram (CELEXA) 20 MG tablet Take 20 mg by mouth at bedtime.  08/05/11   [provider]  colchicine 0.6 MG tablet Take 0.6 mg by mouth daily as needed (gout flares). 01/20/20   [provider]  cyclobenzaprine (FLEXERIL) 10 MG tablet Take 10 mg by mouth 3 (three)  times daily as needed for muscle spasms. 02/21/21   [provider]  cycloSPORINE (RESTASIS) 0.05 % ophthalmic emulsion Place 1 drop into both eyes 2 (two) times daily.    [provider]  ferrous sulfate 325 (65 FE) MG EC tablet Take 325 mg by mouth in the morning.    [provider]  furosemide (LASIX) 80 MG tablet Take 1 tablet (80 mg total) by mouth every morning AND 0.5 tablets (40 mg total) every other day. 01/01/22   Lorretta Harp, MD  Hypromellose (ARTIFICIAL TEARS OP) Place 1 drop into both eyes 3 (three) times daily as needed (dry eyes).    [provider]  losartan (COZAAR) 50 MG  tablet Take 1 tablet (50 mg total) by mouth daily. 07/13/19   Oswald Hillock, MD  metoprolol succinate (TOPROL-XL) 25 MG 24 hr tablet TAKE ONE TABLET BY MOUTH ONCE DAILY 11/11/21   Lorretta Harp, MD  Multiple Vitamin (MULTIVITAMIN WITH MINERALS) TABS tablet Take 1 tablet by mouth in the morning. Centrum Silver (NO IRON)    [provider]  Omega-3 Fatty Acids (FISH OIL PO) Take 1 capsule by mouth in the morning.    [provider]  omeprazole (PRILOSEC) 20 MG capsule Take 20 mg by mouth daily before breakfast.  07/04/19   [provider]  ondansetron (ZOFRAN) 4 MG tablet Take by mouth. 02/21/21   [provider]  oxyCODONE-acetaminophen (PERCOCET/ROXICET) 5-325 MG tablet Take 1 tablet by mouth daily as needed (pain.). 11/08/20   [provider]  polycarbophil (FIBERCON) 625 MG tablet Take 625 mg by mouth in the morning and at bedtime.    [provider]  potassium chloride SA (KLOR-CON M) 20 MEQ tablet Take with furosemide on Friday and Saturday for two doses. 08/29/21   Deberah Pelton, NP  pramipexole (MIRAPEX) 0.5 MG tablet Take 1 mg by mouth every evening.    [provider]  temazepam (RESTORIL) 15 MG capsule Take 1 capsule (15 mg total) by mouth at bedtime. 12/02/16   Love, Ivan Anchors, PA-C  Turmeric Curcumin 500 MG CAPS Take 500 mg by mouth at bedtime.     [provider]  vitamin E 180 MG (400 UNITS) capsule Take 400 Units by mouth at bedtime.    [provider]      Allergies    Morphine and related; Penicillins; Baclofen; Celecoxib; Doxycycline; Levofloxacin; Penicillin g benzathine; Pneumococcal vac polyvalent; Sulfa antibiotics; Sulfamethoxazole; and Tetanus toxoid, adsorbed    Review of Systems   Review of Systems  Constitutional:        Check kidney function per doctor's office   Respiratory:  Positive for shortness of breath (Intermittent).     Physical Exam Updated Vital Signs BP (!) 158/70   Pulse  68   Temp 97.6 F (36.4 C) (Oral)   Resp (!) 21   Ht '5\' 3"'$  (1.6 m)   Wt 82.2 kg   SpO2 94%   BMI 32.10 kg/m  Physical Exam Vitals and nursing note reviewed.  Constitutional:      General: She is not in acute distress.    Appearance: She is well-developed. She is not ill-appearing, toxic-appearing or diaphoretic.  HENT:     Head: Normocephalic and atraumatic.     Mouth/Throat:     Mouth: Mucous membranes are moist.     Pharynx: Oropharynx is clear.  Eyes:     Conjunctiva/sclera: Conjunctivae normal.  Cardiovascular:     Rate and  Rhythm: Normal rate and regular rhythm.     Pulses: Normal pulses.     Heart sounds: No murmur heard. Pulmonary:     Effort: Pulmonary effort is normal. No respiratory distress.     Breath sounds: Normal breath sounds. No stridor. No wheezing, rhonchi or rales.  Chest:     Chest wall: No tenderness.     Comments: Able to communicate very well for long periods without difficulty.  Lungs CTAB.  Without evidence of respiratory distress or increased effort. Abdominal:     General: There is no distension.     Palpations: Abdomen is soft.     Tenderness: There is no abdominal tenderness. There is no right CVA tenderness, left CVA tenderness or guarding.  Musculoskeletal:        General: No swelling.     Cervical back: Neck supple. No rigidity.     Right lower leg: No edema.     Left lower leg: No edema.  Skin:    General: Skin is warm and dry.     Capillary Refill: Capillary refill takes less than 2 seconds.     Comments: Bandage on the right lateral lower leg at site of mole removal.  Without significant erythema or surrounding warmth.  Lower extremities without significant pitting edema.  Neurological:     Mental Status: She is alert and oriented to person, place, and time.  Psychiatric:        Mood and Affect: Mood normal.     ED Results / Procedures / Treatments   Labs (all labs ordered are listed, but only abnormal results are  displayed) Labs Reviewed  CBC WITH DIFFERENTIAL/PLATELET - Abnormal; Notable for the following components:      Result Value   RBC 3.78 (*)    Hemoglobin 10.8 (*)    HCT 34.6 (*)    All other components within normal limits  COMPREHENSIVE METABOLIC PANEL - Abnormal; Notable for the following components:   BUN 28 (*)    Creatinine, Ser 1.18 (*)    GFR, Estimated 46 (*)    All other components within normal limits  BRAIN NATRIURETIC PEPTIDE - Abnormal; Notable for the following components:   B Natriuretic Peptide 338.1 (*)    All other components within normal limits    EKG EKG Interpretation  Date/Time:  Monday March 24 2022 17:48:40 EST Ventricular Rate:  67 PR Interval:  180 QRS Duration: 157 QT Interval:  490 QTC Calculation: 518 R Axis:   2 Text Interpretation: Sinus rhythm Left bundle branch block Confirmed by Lennice Sites ([id number]) on 03/24/2022 6:17:30 PM  Radiology DG Chest 1 View  Result Date: 03/24/2022 CLINICAL DATA:  Episodic shortness of breath EXAM: CHEST  1 VIEW COMPARISON:  Chest x-ray 09/19/2019, CT abdomen pelvis 08/10/2019. FINDINGS: The heart and mediastinal contours are unchanged. Aortic calcification. Large hiatal hernia. No focal consolidation. No pulmonary edema. No pleural effusion. No pneumothorax. No acute osseous abnormality. Total reverse right shoulder arthroplasty. IMPRESSION: 1. No active disease. 2. Large hiatal hernia. Electronically Signed   By: Iven Finn M.D.   On: 03/24/2022 17:44    Procedures Procedures    Medications Ordered in ED Medications - No data to display  ED Course/ Medical Decision Making/ A&P Clinical Course as of 03/24/22 1854  Mon Mar 24, 2022  1738 Creatinine(!): 1.18 Improved from baseline over last 3-6 months [AC]    Clinical Course User Index [AC] Prince Rome, PA-C  Medical Decision Making Amount and/or Complexity of Data Reviewed Labs: ordered. Decision-making  details documented in ED Course. Radiology: ordered.   81 y.o. female presents to the ED for concern of Lab per PCP   This involves an extensive number of treatment options, and is a complaint that carries with it a high risk of complications and morbidity.     Past Medical History / Co-morbidities / Social History: Hx of IDA, chronic DVT of right LE, hemochromatosis, HTN, LBBB, CHF, CAD, hyperlipidemia, and recent prolapse of anterior vaginal wall Social Determinants of Health include: Elderly  Additional History:  Obtained by chart review.  Notably recent cardiology notes, see for details  Lab Tests: I ordered, and personally interpreted labs.  The pertinent results include:   CMP without evidence of electrolyte derangement.  Creatinine 1.18 and BUN 28 with GFR 46.  GFR without significant changes since prior labs.  Creatinine and BUN with improvement when compared to recent labs over the last 6 to 12 months. BNP 338.1, appears close to baseline labs over last 1-2 years Mild anemia, appears stable  Imaging Studies: I ordered imaging studies including CXR.   I independently visualized and interpreted imaging which showed no evidence of pneumothorax, I agree with the radiologist interpretation.  ED Course: Pt well-appearing on exam.  Nontoxic, nonseptic appearing in NAD.  Not tachypneic or tachycardic.  Resting comfortably.  Afebrile.  Presenting today under direction from another medical office to request reassessment of renal function.  2 to 3 months ago Lasix dosage increased from 80 mg daily to 80 mg daily with an extra 40 mg every other day.  Pt reports earlier today increased shortness of breath while attempting to void.  Per record review and pt report, with occasional increased difficulty voiding following worsening of known known prolapsed vaginal wall over last 10 years.  Currently being assessed/followed by urology for this.  Patient and her notes indicate significant  improvement with pessary device from appointment last week, and has follow-up scheduled within the next 1 to 2 weeks.  Patient denies difficulty with urination otherwise today, states she has been voiding without difficulty since earlier described incident.  Pt also reports her dyspnea on exertion has been intermittent and unchanged over the last 6+ months.  Without new characteristics, leg pain, extremity pain/weakness, chest pain, or neuro deficits.  Pt states this has continued to bother her and had felt it worsen when she strained earlier today to void.  Again, denies difficulty voiding at this time and has urinated 3 times without difficulty since this episode.  No hx of COPD and without wheezing. Renal function today without evidence of significant changes or concerning decline.  No other urinary symptoms reported.  Without significant swelling or evidence of fluid overload on exam.  On daily aspirin for chronic DVT right lower extremity.  CXR and EKG without acute changes or suggestion of infarction.  Low suspicion for acute myocardial infarction.  Pt communicating well and without any appreciated difficulty.  Continues to remain between 94-99% on room air.  Not tachycardic.  Continuously denies active shortness of breath or dyspnea, or sudden/recent changes of such.  Wells criteria of 1.5 for PE, low risk.  Has remained hemodynamically stable throughout encounter.  Due to overall reassuring labs, physical exam findings, and history, believe pt would strongly benefit from close follow up with cardiology in next 1-2 days for re-evaluation and further workup. Recommend continuation of lasix as prescribed.  Pt reports satisfaction with today's encounter.  Patient in NAD and in good condition at time of discharge.  Disposition: After consideration the patient's encounter today, I do not feel today's workup suggests an emergent condition requiring admission or immediate intervention beyond what has been  performed at this time.  Safe for discharge; instructed to return immediately for worsening symptoms, change in symptoms or any other concerns.  I have reviewed the patients home medicines and have made adjustments as needed.  Discussed course of treatment with the patient, whom demonstrated understanding.  Patient in agreement and has no further questions.    I discussed this case with attending physician Dr. Ronnald Nian, who agreed with the proposed treatment course and cosigned this note including patient's presenting symptoms, physical exam, and planned diagnostics and interventions.  Attending physician stated agreement with plan or made changes to plan which were implemented.    This chart was dictated using voice recognition software.  Despite best efforts to proofread, errors can occur which can change the documentation meaning.         Final Clinical Impression(s) / ED Diagnoses Final diagnoses:  Dyspnea, unspecified type  Encounter for laboratory test    Rx / DC Orders ED Discharge Orders          Ordered    Ambulatory referral to Cardiology       Comments: If you have not heard from the Cardiology office within the next 72 hours please call [phone].   03/24/22 [place], [place], PA-C 64/35/39 [place], [place], DO 03/24/22 2252

## 2022-03-24 NOTE — Discharge Instructions (Addendum)
You were seen in the emergency department today for lab recheck and intermittent shortness of breath.  Overall workup today was reassuring.  Without evidence of significant changes in kidney function.  Your chest x-ray also looked reassuring.  Recommend close follow-up with cardiology in the next 1 to 2 days for reevaluation and continued medical management.  Also recommend close follow-up with PCP/urology for continued cohesive management.  Return to the ED for new or worsening symptoms as discussed.

## 2022-03-24 NOTE — ED Triage Notes (Signed)
PCP sent to get "blood checked for my kidneys"  Been taking lasix  No associated symptoms   H/o CHF, SOB with exacerbation

## 2022-03-25 ENCOUNTER — Ambulatory Visit: Payer: PPO | Attending: Student | Admitting: Student

## 2022-03-25 ENCOUNTER — Encounter: Payer: Self-pay | Admitting: Student

## 2022-03-25 VITALS — BP 124/64 | HR 77 | Ht 63.0 in | Wt 187.0 lb

## 2022-03-25 DIAGNOSIS — I2510 Atherosclerotic heart disease of native coronary artery without angina pectoris: Secondary | ICD-10-CM | POA: Diagnosis not present

## 2022-03-25 DIAGNOSIS — N1831 Chronic kidney disease, stage 3a: Secondary | ICD-10-CM | POA: Diagnosis not present

## 2022-03-25 DIAGNOSIS — I10 Essential (primary) hypertension: Secondary | ICD-10-CM | POA: Diagnosis not present

## 2022-03-25 DIAGNOSIS — I5042 Chronic combined systolic (congestive) and diastolic (congestive) heart failure: Secondary | ICD-10-CM | POA: Diagnosis not present

## 2022-03-25 DIAGNOSIS — E785 Hyperlipidemia, unspecified: Secondary | ICD-10-CM

## 2022-03-25 DIAGNOSIS — R0609 Other forms of dyspnea: Secondary | ICD-10-CM

## 2022-03-25 DIAGNOSIS — I428 Other cardiomyopathies: Secondary | ICD-10-CM | POA: Diagnosis not present

## 2022-03-25 MED ORDER — FUROSEMIDE 80 MG PO TABS: ORAL_TABLET | ORAL | 3 refills | Status: DC

## 2022-03-25 NOTE — Patient Instructions (Signed)
Medication Instructions:  INCREASE Lasix 80 mg in the mornings and 40 mg in the afternoon (3 PM)  *If you need a refill on your cardiac medications before your next appointment, please call your pharmacy*  Lab Work: Your physician recommends that you return for lab work in 1 week:  BMP BNP  If you have labs (blood work) drawn today and your tests are completely normal, you will receive your results only by: [place] (if you have MyChart) OR A paper copy in the mail If you have any lab test that is abnormal or we need to change your treatment, we will call you to review the results.  Testing/Procedures: NONE ordered at this time of appointment   Follow-Up: At [facility], you and your health needs are our priority.  As part of our continuing mission to provide you with exceptional heart care, we have created designated Provider Care Teams.  These Care Teams include your primary Cardiologist (physician) and Advanced Practice Providers (APPs -  Physician Assistants and Nurse Practitioners) who all work together to provide you with the care you need, when you need it.  We recommend signing up for the patient portal called "MyChart".  Sign up information is provided on this After Visit Summary.  MyChart is used to connect with patients for Virtual Visits (Telemedicine).  Patients are able to view lab/test results, encounter notes, upcoming appointments, etc.  Non-urgent messages can be sent to your provider as well.   To learn more about what you can do with MyChart, go to [email or web address].    Your next appointment:   As previously scheduled   The format for your next appointment:   In Person  Provider:   Coletta Memos, FNP        Other Instructions  Important Information About Sugar

## 2022-03-25 NOTE — Progress Notes (Signed)
Cardiology Office Note:    Date:  03/25/2022   ID:  [name], DOB 1941/04/08, MRN [id number]  PCP:  Shirline Frees, MD  Cardiologist:  Quay Burow, MD  Electrophysiologist:  None   Referring MD: Shirline Frees, MD   Chief Complaint: ED follow-up of shortness of breath  History of Present Illness:    [name] is a 81 y.o. female with a history of mild non-obstructive CAD noted on cardiac catheterization in 06/2019, non-ischemic cardiomyopathy/ chronic combined CHF with EF of 40-45% on Echo in 12/2021, LBBB, prior stroke, hypertension, hyperlipidemia, CKD stage III, GERD, prior DVT/PE s/p IVC filter in 02/2020 (removed in 05/2020), thrombophilia with prothrombin gene mutation, and iron deficiency anemia who is followed by Dr. Gwenlyn Found and presents today for ED follow-up of shortness of breath..  Patient was initially referred to Dr. Gwenlyn Found in 05/2019 for further evaluation of dyspnea on exertion which she had had for several years at that time.  Echo was ordered for further evaluation and showed LVEF of 45-50% with global hypokinesis, mild asymmetric LVH, and grade 1 diastolic dysfunction as well as mild mitral regurgitation. Given reduced EF, Myoview was ordered and showed a fixed defect in the mid anteroseptal and apical septal location possibly due to underlying LBBB but no ischemia. She was then admitted in 06/2019 for acute CHF. R/LHC during that admission showed mild CAD with only 45% stenosis of proximal LAD, LVEDP of 25 mmHg, and mildly elevated right heart pressures with PASP of 37 mmHg and mean pressure of 25 mmHg. Patient also has a history of multiple DVTs and PEs in the past. She had a IVC filter placed in 02/2020 in anticipation of knee surgery and this was removed in 05/2020.  Patient was last seen by Dr. Gwenlyn Found in 12/2021 at which time she was noted to have 1+ right ankle edema but was otherwise stable from a cardiac standpoint. Her Lasix was increased and Echo was ordered.  Echo showed LVEF of 40-45% with global hypokinesis and grade 2 diastolic dysfunction, normal RV, and mild to moderate MR.   Patient presented to the ED yesterday at the advice of a home health nurse from her insurance company. Nurse was reportedly concerned about patient's kidney function on the dose of Lasix she was on and advised her to go and get this check. However, while in the ED she also reported concerns about dyspnea on exertion. EKG showed normal sinus rhythm with known LBBB. BNP was mildly elevated at 338.1. Chest x-ray showed a large hiatal hernia but no acute cardiopulmonary findings. WBC 5.3, Hgb 10.8, Plts 155. Na 140, K 3.5, Glucose 91, BUN 28, Cr 1.18. Patient was felt to be stable for discharge.   Patient presents today for follow-up. Patient has long history of dyspnea on exertion when she is "rushing" or moving quickly. She thinks this may be getting slightly worse but overall sounds like this is relatively stable. She states she does okay as long as she walks at a decent pace. For instance, she does all her own grocery shopping and does okay with this. She seems to be more concerned about the shortness of breath she has when she bends over or reaches towards her legs. She is wondering if this could be due to her large hiatal hernia. She denies any significant shortness of breath at rest. No orthopnea or PND. She has chronic lower extremity edema but compression stockings have helped. Weight is relatively stable and only up 2 lbs  from last office visit in 12/2021. She has vaginal prolapse and currently has a pessary. She states sometime she has trouble urinating due to this. She is being followed by Urology. She does not feel like she is responding to Lasix '80mg'$  as well as she used to. On the days that she takes an extra '40mg'$ , she does feels like she has a better urinary response. She denies any chest pain, palpitations, lightheadedness, dizziness, or syncope.  Past Medical History:   Diagnosis Date   Anemia    Arthritis    osteoarthritis Shoulder,neck   Bell's palsy 1980s   CHF (congestive heart failure) ([place]) 06/2019   Chronic kidney disease    Stage III   Dyspnea 06/2019   On exertion   GERD (gastroesophageal reflux disease)    Hemochromatosis    History of hiatal hernia    Hypertension    Iron deficiency anemia due to chronic blood loss 03/10/2017   PE (pulmonary embolism) 2007   x2   Peripheral vascular disease (HCC)    DVT Rt leg   Pneumonia 1990s   "walking" pneumonia   Prothrombin gene mutation ([place]) 05/30/2013   Restless legs    Right leg DVT ([place]) 05/30/2013   Stroke ([place])    found on a MRI, she's not aware otherwise    Past Surgical History:  Procedure Laterality Date   BACK SURGERY     CARDIAC CATHETERIZATION Bilateral 2005   COLONOSCOPY     IVC FILTER INSERTION N/A 03/12/2020   Procedure: IVC FILTER INSERTION;  Surgeon: Waynetta Sandy, MD;  Location: [facility];  Service: Cardiovascular;  Laterality: N/A;   IVC FILTER REMOVAL N/A 06/11/2020   Procedure: IVC FILTER REMOVAL;  Surgeon: Waynetta Sandy, MD;  Location: [facility];  Service: Cardiovascular;  Laterality: N/A;   REVERSE SHOULDER ARTHROPLASTY Right 03/07/2021   Procedure: REVERSE SHOULDER ARTHROPLASTY;  Surgeon: Justice Britain, MD;  Location: [facility];  Service: Orthopedics;  Laterality: Right;  181mn   RIGHT/LEFT HEART CATH AND CORONARY ANGIOGRAPHY N/A 07/12/2019   Procedure: RIGHT/LEFT HEART CATH AND CORONARY ANGIOGRAPHY;  Surgeon: KTroy Sine MD;  [facility];  Service: Cardiovascular;  Laterality: N/A;   SHOULDER SURGERY Bilateral    TOTAL KNEE ARTHROPLASTY Right 03/16/2020   Procedure: TOTAL KNEE ARTHROPLASTY;  Surgeon: BSusa Day MD;  Location: [facility];  Service: Orthopedics;  Laterality: Right;  2.5 hrs    Current Medications: Current Meds  Medication Sig   ALBUTEROL IN 1 tab   allopurinol (ZYLOPRIM) 300 MG tablet  Take 300 mg by mouth in the morning.   aspirin EC 81 MG tablet Take 162 mg by mouth in the morning. Swallow whole.   atorvastatin (LIPITOR) 40 MG tablet Take 1 tablet (40 mg total) by mouth daily at 6 PM.   Biotin 1000 MCG tablet Take 1,000 mcg by mouth in the morning.   Calcium Carb-Cholecalciferol (CALCIUM 600+D3 PO) Take 1 tablet by mouth in the morning and at bedtime.   cholecalciferol (VITAMIN D) 25 MCG (1000 UNIT) tablet Take 1,000 Units by mouth in the morning and at bedtime.   citalopram (CELEXA) 20 MG tablet Take 20 mg by mouth at bedtime.    colchicine 0.6 MG tablet Take 0.6 mg by mouth daily as needed (gout flares).   cyclobenzaprine (FLEXERIL) 10 MG tablet Take 10 mg by mouth 3 (three) times daily as needed for muscle spasms.   cycloSPORINE (RESTASIS) 0.05 % ophthalmic emulsion Place 1  drop into both eyes 2 (two) times daily.   ferrous sulfate 325 (65 FE) MG EC tablet Take 325 mg by mouth in the morning.   Hypromellose (ARTIFICIAL TEARS OP) Place 1 drop into both eyes 3 (three) times daily as needed (dry eyes).   losartan (COZAAR) 50 MG tablet Take 1 tablet (50 mg total) by mouth daily.   metoprolol succinate (TOPROL-XL) 25 MG 24 hr tablet TAKE ONE TABLET BY MOUTH ONCE DAILY   Multiple Vitamin (MULTIVITAMIN WITH MINERALS) TABS tablet Take 1 tablet by mouth in the morning. Centrum Silver (NO IRON)   Omega-3 Fatty Acids (FISH OIL PO) Take 1 capsule by mouth in the morning.   omeprazole (PRILOSEC) 20 MG capsule Take 20 mg by mouth daily before breakfast.    ondansetron (ZOFRAN) 4 MG tablet Take by mouth.   oxyCODONE-acetaminophen (PERCOCET/ROXICET) 5-325 MG tablet Take 1 tablet by mouth daily as needed (pain.).   polycarbophil (FIBERCON) 625 MG tablet Take 625 mg by mouth in the morning and at bedtime.   potassium chloride SA (KLOR-CON M) 20 MEQ tablet Take with furosemide on Friday and Saturday for two doses.   pramipexole (MIRAPEX) 0.5 MG tablet Take 1 mg by mouth every evening.    temazepam (RESTORIL) 15 MG capsule Take 1 capsule (15 mg total) by mouth at bedtime.   Turmeric Curcumin 500 MG CAPS Take 500 mg by mouth at bedtime.    vitamin E 180 MG (400 UNITS) capsule Take 400 Units by mouth at bedtime.   [DISCONTINUED] furosemide (LASIX) 80 MG tablet Take 1 tablet (80 mg total) by mouth every morning AND 0.5 tablets (40 mg total) every other day.     Allergies:   Morphine and related; Penicillins; Baclofen; Celecoxib; Doxycycline; Levofloxacin; Penicillin g benzathine; Pneumococcal vac polyvalent; Sulfa antibiotics; Sulfamethoxazole; and Tetanus toxoid, adsorbed   Social History   Socioeconomic History   Marital status: Married    Spouse name: Not on file   Number of children: Not on file   Years of education: Not on file   Highest education level: Not on file  Occupational History   Not on file  Tobacco Use   Smoking status: Never   Smokeless tobacco: Never   Tobacco comments:    never used tobacco  Vaping Use   Vaping Use: Never used  Substance and Sexual Activity   Alcohol use: No    Alcohol/week: 0.0 standard drinks of alcohol   Drug use: No   Sexual activity: Not Currently  Other Topics Concern   Not on file  Social History Narrative   Not on file   Social Determinants of Health   Financial Resource Strain: Not on file  Food Insecurity: Not on file  Transportation Needs: Not on file  Physical Activity: Not on file  Stress: Not on file  Social Connections: Not on file     Family History: The patient's family history includes Congestive Heart Failure in her mother; Pancreatic cancer in her father.  ROS:   Please see the history of present illness.    EKGs/Labs/Other Studies Reviewed:    The following studies were reviewed today:  Right/ Left Cardiac Catheterization 07/12/2019: Prox LAD lesion is 45% stenosed.   Mild coronary obstructive disease with a smooth area of 40 to 50% proximal LAD stenosis in the region of a large first  diagonal vessel.  The remainder of the coronary arteries are angiographically normal including a large ramus intermediate vessel, left circumflex vessel, and dominant RCA.  Mild right heart pressure elevation with PA systolic pressure at 37 and mean pressure 25 mmHg.   LVEDP: 25 mmHg   Recommendation: Medical therapy for mild CAD involving her proximal LAD with guideline directed medical therapy for LV dysfunction.  Diagnostic Dominance: Right     _______________  Echocardiogram 01/14/2022: Impressions: 1. Prominent LV dys-synchrony related to LBBB. Left ventricular ejection  fraction, by estimation, is 40 to 45%. The left ventricle has mildly  decreased function. The left ventricle demonstrates global hypokinesis.  Left ventricular diastolic parameters  are consistent with Grade II diastolic dysfunction (pseudonormalization).   2. Right ventricular systolic function is normal. The right ventricular  size is normal. Tricuspid regurgitation signal is inadequate for assessing  PA pressure.   3. Left atrial size was moderately dilated.   4. The mitral valve is grossly normal. Mild to moderate mitral valve  regurgitation. No evidence of mitral stenosis.   5. The aortic valve is tricuspid. Aortic valve regurgitation is not  visualized. No aortic stenosis is present.   6. The inferior vena cava is normal in size with greater than 50%  respiratory variability, suggesting right atrial pressure of 3 mmHg.    EKG:  EKG not ordered today.  Recent Labs: 03/24/2022: ALT 20; B Natriuretic Peptide 338.1; BUN 28; Creatinine, Ser 1.18; Hemoglobin 10.8; Platelets 155; Potassium 3.5; Sodium 140  Recent Lipid Panel    Component Value Date/Time   CHOL 152 07/10/2019 0525   TRIG 75 07/10/2019 0525   HDL 64 07/10/2019 0525   CHOLHDL 2.4 07/10/2019 0525   VLDL 15 07/10/2019 0525   LDLCALC 73 07/10/2019 0525    Physical Exam:    Vital Signs: BP (!) 142/74 (BP Location: Right Arm, Patient  Position: Sitting, Cuff Size: Large)   Pulse 77   Ht '5\' 3"'$  (1.6 m)   Wt 187 lb (84.8 kg)   SpO2 96%   BMI 33.13 kg/m     Wt Readings from Last 3 Encounters:  03/25/22 187 lb (84.8 kg)  03/24/22 181 lb 3.5 oz (82.2 kg)  01/14/22 181 lb 1.9 oz (82.2 kg)     General: 81 y.o. Caucasian female in no acute distress. HEENT: Normocephalic and atraumatic. Sclera clear.  Neck: Supple.  No JVD. Heart: RRR. Distinct S1 and S2. No murmurs, gallops, or rubs. Radial pulses 2+ and equal bilaterally. Lungs: No increased work of breathing. Clear to ausculation bilaterally. No wheezes, rhonchi, or rales.  Abdomen: Soft, non-distended, and non-tender to palpation.  Extremities: No significant lower extremity edema. Skin: Warm and dry. Neuro: Alert and oriented x3. No focal deficits. Psych: Normal affect. Responds appropriately.   Assessment:    1. Dyspnea on exertion   2. Chronic combined systolic and diastolic CHF (congestive heart failure) ([place])   3. Non-ischemic cardiomyopathy ([place])   4. Non-obstructive CAD   5. Hypertension, unspecified type   6. Hyperlipidemia, unspecified hyperlipidemia type   7. Stage 3a chronic kidney disease (HCC)     Plan:    Chronic Dyspnea on Exertion Chronic Combined CHF Non-Ischemic Cardiomyopathy Last Echo Echo showed LVEF of 40-45% with global hypokinesis and grade 2 diastolic dysfunction, normal RV, and mild to moderate MR. BNP in the ED yesterday was mildly elevated at 338. Chest x-ray showed a large hiatal hernia but no edema or infiltrates.  - Patient has a long history of dyspnea on exertion. She feels like this may be getting slightly worse but overall it sounds stable. She seems to be more concerned about  how short of breath she gets when she bends over or when she reaches towards her feet which I wonder if this could be due to her large hiatal hernia. - She does not appear volume overloaded on exam. - She currently take Lasix '80mg'$  daily and then an  additional '40mg'$  in the afternoon every other day. She does feel like she has a better urinary response on the days that she takes an additional '40mg'$ . Discussed switching her from Lasix to Torsemide to see if she had a better response to this vs increasing Lasix and patient preferred to stay on Lasix.  Therefore, will increase her Lasix to '80mg'$  in the morning and '40mg'$  in the afternoon every day. Patient has Kcl listed under her home medications but she is not sure she is taking this and if she is she is not sure how often. I asked her to go home and look at medicines and then call us and let us know because we will likely need to increase this with higher dose of Lasix as potassium was 3.5 on labs in ED yesterday.  Will repeat a BMET and BNP in 1 week.  - Continue Losartan '50mg'$  daily and Toprol-XL '25mg'$  daily. - If renal function tolerates higher dose of Lasix, could also consider adding Spironolactone.  - She does have a history of PE but I have a very low suspicion for this. She is not hypoxic, tachypneic, or tachycardic. This is a chronic issue and for the most part seems stable.  - Discussed following back up with GI in regards to her large hiatal hernia as this may be contributing.   Non-Obstructive CAD Noted on cardiac catheterization in 06/2019. - No chest pain.  - Continue Aspirin. She takes 162 mg daily given history of multiple DVTs. - Continue high-intensity statin.   Hypertension BP initially mildly elevated at 142/74 but improved to 124/64 on my personal recheck at the end of visit.  - Continue current medications: Losartan '50mg'$  daily and Toprol-XL '25mg'$  daily.   Hyperlipidemia Last lipid panel in 10/2021: Total Cholesterol 138, Triglycerides 77, HDL 85, LDL 38. - Continue Lipitor '40mg'$  daily.   CKD Stage III Creatinine 1.18 in the ED yesterday. Baseline seems to be around 1.1 to 1.5. - Will repeat BMET in 1 week after increasing Lasix.   Disposition: Please keep follow-up with Coletta Memos next month as scheduled.   Medication Adjustments/Labs and Tests Ordered: Current medicines are reviewed at length with the patient today.  Concerns regarding medicines are outlined above.  Orders Placed This Encounter  Procedures   Basic metabolic panel   Brain natriuretic peptide   Meds ordered this encounter  Medications   furosemide (LASIX) 80 MG tablet    Sig: Take 1 tablet (80 mg total) by mouth every morning AND 0.5 tablets (40 mg total) every evening.    Dispense:  125 tablet    Refill:  3    Patient Instructions  Medication Instructions:  INCREASE Lasix 80 mg in the mornings and 40 mg in the afternoon (3 PM)  *If you need a refill on your cardiac medications before your next appointment, please call your pharmacy*  Lab Work: Your physician recommends that you return for lab work in 1 week:  BMP BNP  If you have labs (blood work) drawn today and your tests are completely normal, you will receive your results only by: [place] (if you have MyChart) OR A paper copy in the mail If you have  any lab test that is abnormal or we need to change your treatment, we will call you to review the results.  Testing/Procedures: NONE ordered at this time of appointment   Follow-Up: At [facility], you and your health needs are our priority.  As part of our continuing mission to provide you with exceptional heart care, we have created designated Provider Care Teams.  These Care Teams include your primary Cardiologist (physician) and Advanced Practice Providers (APPs -  Physician Assistants and Nurse Practitioners) who all work together to provide you with the care you need, when you need it.  We recommend signing up for the patient portal called "MyChart".  Sign up information is provided on this After Visit Summary.  MyChart is used to connect with patients for Virtual Visits (Telemedicine).  Patients are able to view lab/test results, encounter notes, upcoming  appointments, etc.  Non-urgent messages can be sent to your provider as well.   To learn more about what you can do with MyChart, go to [email or web address].    Your next appointment:   As previously scheduled   The format for your next appointment:   In Person  Provider:   Coletta Memos, FNP        Other Instructions  Important Information About Sugar         Signed, Darreld Mclean, PA-C  03/25/2022 11:15 PM    [place]

## 2022-03-27 DIAGNOSIS — N183 Chronic kidney disease, stage 3 unspecified: Secondary | ICD-10-CM | POA: Diagnosis not present

## 2022-03-27 DIAGNOSIS — E7800 Pure hypercholesterolemia, unspecified: Secondary | ICD-10-CM | POA: Diagnosis not present

## 2022-03-27 DIAGNOSIS — I5043 Acute on chronic combined systolic (congestive) and diastolic (congestive) heart failure: Secondary | ICD-10-CM | POA: Diagnosis not present

## 2022-03-27 DIAGNOSIS — I10 Essential (primary) hypertension: Secondary | ICD-10-CM | POA: Diagnosis not present

## 2022-04-01 DIAGNOSIS — N398 Other specified disorders of urinary system: Secondary | ICD-10-CM | POA: Diagnosis not present

## 2022-04-01 DIAGNOSIS — N816 Rectocele: Secondary | ICD-10-CM | POA: Diagnosis not present

## 2022-04-01 DIAGNOSIS — I5042 Chronic combined systolic (congestive) and diastolic (congestive) heart failure: Secondary | ICD-10-CM | POA: Diagnosis not present

## 2022-04-01 DIAGNOSIS — Z4689 Encounter for fitting and adjustment of other specified devices: Secondary | ICD-10-CM | POA: Diagnosis not present

## 2022-04-01 DIAGNOSIS — N952 Postmenopausal atrophic vaginitis: Secondary | ICD-10-CM | POA: Diagnosis not present

## 2022-04-02 LAB — BASIC METABOLIC PANEL WITH GFR
BUN/Creatinine Ratio: 23 (ref 12–28)
BUN: 28 mg/dL — ABNORMAL HIGH (ref 8–27)
CO2: 29 mmol/L (ref 20–29)
Calcium: 10.2 mg/dL (ref 8.7–10.3)
Chloride: 99 mmol/L (ref 96–106)
Creatinine, Ser: 1.22 mg/dL — ABNORMAL HIGH (ref 0.57–1.00)
Glucose: 89 mg/dL (ref 70–99)
Potassium: 3.9 mmol/L (ref 3.5–5.2)
Sodium: 143 mmol/L (ref 134–144)
eGFR: 45 mL/min/1.73 — ABNORMAL LOW

## 2022-04-02 LAB — BRAIN NATRIURETIC PEPTIDE: BNP: 288.9 pg/mL — ABNORMAL HIGH (ref 0.0–100.0)

## 2022-04-03 DIAGNOSIS — I5043 Acute on chronic combined systolic (congestive) and diastolic (congestive) heart failure: Secondary | ICD-10-CM | POA: Diagnosis not present

## 2022-04-03 DIAGNOSIS — Z87448 Personal history of other diseases of urinary system: Secondary | ICD-10-CM | POA: Diagnosis not present

## 2022-04-03 DIAGNOSIS — I10 Essential (primary) hypertension: Secondary | ICD-10-CM | POA: Diagnosis not present

## 2022-04-03 DIAGNOSIS — Z09 Encounter for follow-up examination after completed treatment for conditions other than malignant neoplasm: Secondary | ICD-10-CM | POA: Diagnosis not present

## 2022-04-03 DIAGNOSIS — E7800 Pure hypercholesterolemia, unspecified: Secondary | ICD-10-CM | POA: Diagnosis not present

## 2022-04-03 DIAGNOSIS — N183 Chronic kidney disease, stage 3 unspecified: Secondary | ICD-10-CM | POA: Diagnosis not present

## 2022-04-14 NOTE — Progress Notes (Deleted)
Cardiology Clinic Note   Patient Name: [name] Date of Encounter: 04/14/2022  Primary Care Provider:  Shirline Frees, MD Primary Cardiologist:  Quay Burow, MD  Patient Profile    [name] 81 year old female presents to the clinic today for follow-up evaluation of her CHF.  Past Medical History    Past Medical History:  Diagnosis Date   Anemia    Arthritis    osteoarthritis Shoulder,neck   Bell's palsy 1980s   CHF (congestive heart failure) ([place]) 06/2019   Chronic kidney disease    Stage III   Dyspnea 06/2019   On exertion   GERD (gastroesophageal reflux disease)    Hemochromatosis    History of hiatal hernia    Hypertension    Iron deficiency anemia due to chronic blood loss 03/10/2017   PE (pulmonary embolism) 2007   x2   Peripheral vascular disease (H[facility])    DVT Rt leg   Pneumonia 1990s   "walking" pneumonia   Prothrombin gene mutation ([place]) 05/30/2013   Restless legs    Right leg DVT ([place]) 05/30/2013   Stroke ([place])    found on a MRI, she's not aware otherwise   Past Surgical History:  Procedure Laterality Date   BACK SURGERY     CARDIAC CATHETERIZATION Bilateral 2005   COLONOSCOPY     IVC FILTER INSERTION N/A 03/12/2020   Procedure: IVC FILTER INSERTION;  Surgeon: Waynetta Sandy, MD;  Location: [facility];  Service: Cardiovascular;  Laterality: N/A;   IVC FILTER REMOVAL N/A 06/11/2020   Procedure: IVC FILTER REMOVAL;  Surgeon: Waynetta Sandy, MD;  Location: [facility];  Service: Cardiovascular;  Laterality: N/A;   REVERSE SHOULDER ARTHROPLASTY Right 03/07/2021   Procedure: REVERSE SHOULDER ARTHROPLASTY;  Surgeon: Justice Britain, MD;  Location: [facility];  Service: Orthopedics;  Laterality: Right;  117mn   RIGHT/LEFT HEART CATH AND CORONARY ANGIOGRAPHY N/A 07/12/2019   Procedure: RIGHT/LEFT HEART CATH AND CORONARY ANGIOGRAPHY;  Surgeon: KTroy Sine MD;  [facility];  Service:  Cardiovascular;  Laterality: N/A;   SHOULDER SURGERY Bilateral    TOTAL KNEE ARTHROPLASTY Right 03/16/2020   Procedure: TOTAL KNEE ARTHROPLASTY;  Surgeon: BSusa Day MD;  Location: [facility];  Service: Orthopedics;  Laterality: Right;  2.5 hrs    Allergies  Allergies  Allergen Reactions   Morphine And Related Other (See Comments)    LARGER DOSES OF MORPHINE CAUSES BODY TWITCHING   Penicillins Anaphylaxis    Childhood reaction Has patient had a PCN reaction causing immediate rash, facial/tongue/throat swelling, SOB or lightheadedness with hypotension: Yes Has patient had a PCN reaction causing severe rash involving mucus membranes or skin necrosis: Unknown Has patient had a PCN reaction that required hospitalization: No Has patient had a PCN reaction occurring within the last 10 years: no (5 or 81yrs old) If all of the above answers are "NO", then may proceed with Cephalosporin use.    Baclofen Other (See Comments)    Other reaction(s): Unknown   Celecoxib Other (See Comments)    Other reaction(s): Unknown   Doxycycline Other (See Comments)   Levofloxacin Other (See Comments)    Other reaction(s): abnl taste   Penicillin G Benzathine Other (See Comments)   Pneumococcal Vac Polyvalent Other (See Comments)    Other reaction(s): local reaction   Sulfa Antibiotics Other (See Comments)    Unknown childhood reaction   Sulfamethoxazole Other (See Comments)   Tetanus Toxoid, Adsorbed Other (See  Comments)    Other reaction(s): local reaction    History of Present Illness    [name] has a PMH of right leg DVT, hypertension, LBBB, CHF, AKI, hypokalemia, DOE, HLD, anemia, and total knee replacement. She may have also had a remote CVA.  Is noted to have progressive DOE over the last several years.  She was admitted to the hospital 3/21 for 4 days with CHF.  She underwent right and left heart cath by Dr. Claiborne Billings that showed 40% proximal LAD with elevated right atrial pressures,  pulmonary artery pressure, wedge pressure and LVEDP.  She was diuresed at that time.  Her admission weight on 06/03/2019 was 188.   She was  seen by Dr. Gwenlyn Found on 08/16/2019.  During that time she denied chest pain.  Her weight was 172 pounds.  She was following a low-sodium diet, and was taking furosemide 80 mg daily.  She denied shortness of breath.   She presented to the clinic 11/15/2019 for follow-up evaluation and stated she felt well .  She was having some right shoulder and right knee pain.  She was in the emergency department recently with right shoulder pain and was told it was related to arthritis.  She was also seeing orthopedics for her right knee pain.  She was planning to have a total knee replacement in the future.  She was unsure whether she would pursue this or not.  She stated that her blood pressure was better controlled and that she  had no chest discomfort.   She underwent IVC filter placement and bilateral renal veins 03/12/2020 by Dr. Donzetta Matters.  This was done prior to her right total knee arthroplasty.  Her IVC filters were removed 06/11/2020.   She presented to the clinic 08/02/20 for follow-up evaluation and stated she felt well.  She had been maintaining her low-sodium diet.  She reported that she  had some pain in her knee since having her knee replacement.  She also noted she was having nosebleeds and was taken off of her Eliquis.  She was taking 2 baby aspirin and had not had any further episodes of bleeding.  She reported that she had not been very physically active because she is worried about falls.  I asked her to use her walker on level ground and increase her physical activity.  I asked her to maintain her low-sodium diet, increase her physical activity, and planned follow-up in 6 months with Dr. Gwenlyn Found.  She contacted the clinic 08/08/2021 and reported weight gain.  She was instructed on low-sodium diet.  She presents*** to the clinic today for follow-up evaluation and states over  the last several days she has noticed swelling in her ankles.  She reports that she occasionally eats out and is unable to control the amount of sodium in that food.  She is otherwise conscious and eats a low-sodium diet.  She reports that she is not very physically active.  She has been sleeping in a recliner due to the arthritis in her left shoulder.  She does notice some increased shortness of breath with increased physical activity and bending over.  She denies chest pain.  She has noticed some vaginal bleeding and has appointment with GYN next week.  She denies orthopnea PND.  I will increase her furosemide for 2 days, give supplemental potassium for 2 days and then have her return to her normal dosing.  We will give her the salty 6 diet sheet order a BMP in 1  week and plan follow-up in 4 to 6 months.    Today she denies chest pain, fatigue, palpitations, melena,  hemoptysis, diaphoresis, weakness, presyncope, syncope, orthopnea, and PND.  Home Medications    Prior to Admission medications   Medication Sig Start Date End Date Taking? Authorizing Provider  ALBUTEROL IN 1 tab    [provider]  allopurinol (ZYLOPRIM) 300 MG tablet Take 300 mg by mouth in the morning. 11/12/20   [provider]  ASPIRIN 81 PO     [provider]  aspirin EC 81 MG tablet Take 162 mg by mouth in the morning. Swallow whole.    [provider]  atorvastatin (LIPITOR) 40 MG tablet Take 1 tablet (40 mg total) by mouth daily at 6 PM. 07/13/19   Oswald Hillock, MD  Biotin 1000 MCG tablet Take 1,000 mcg by mouth in the morning.    [provider]  Calcium Carb-Cholecalciferol (CALCIUM 600+D3 PO) Take 1 tablet by mouth in the morning and at bedtime.    [provider]  cholecalciferol (VITAMIN D) 25 MCG (1000 UNIT) tablet Take 1,000 Units by mouth in the morning and at bedtime.    [provider]  citalopram (CELEXA) 20 MG tablet Take 20 mg by mouth at bedtime.   08/05/11   [provider]  colchicine 0.6 MG tablet Take 0.6 mg by mouth daily as needed (gout flares). 01/20/20   [provider]  cyclobenzaprine (FLEXERIL) 10 MG tablet Take 10 mg by mouth 3 (three) times daily as needed for muscle spasms. 02/21/21   [provider]  cycloSPORINE (RESTASIS) 0.05 % ophthalmic emulsion Place 1 drop into both eyes 2 (two) times daily.    [provider]  ferrous sulfate 325 (65 FE) MG EC tablet Take 325 mg by mouth in the morning.    [provider]  furosemide (LASIX) 80 MG tablet Take 1 tablet (80 mg total) by mouth daily. 07/13/19 02/21/22  Oswald Hillock, MD  Hypromellose (ARTIFICIAL TEARS OP) Place 1 drop into both eyes 3 (three) times daily as needed (dry eyes).    [provider]  losartan (COZAAR) 50 MG tablet Take 1 tablet (50 mg total) by mouth daily. 07/13/19   Oswald Hillock, MD  metoprolol succinate (TOPROL-XL) 25 MG 24 hr tablet TAKE ONE TABLET BY MOUTH ONCE DAILY 08/01/21   Lorretta Harp, MD  Multiple Vitamin (MULTIVITAMIN WITH MINERALS) TABS tablet Take 1 tablet by mouth in the morning. Centrum Silver (NO IRON)    [provider]  Omega-3 Fatty Acids (FISH OIL PO) Take 1 capsule by mouth in the morning.    [provider]  omeprazole (PRILOSEC) 20 MG capsule Take 20 mg by mouth daily before breakfast.  07/04/19   [provider]  ondansetron (ZOFRAN) 4 MG tablet Take by mouth. 02/21/21   [provider]  oxyCODONE-acetaminophen (PERCOCET/ROXICET) 5-325 MG tablet Take 1 tablet by mouth daily as needed (pain.). 11/08/20   [provider]  polycarbophil (FIBERCON) 625 MG tablet Take 625 mg by mouth in the morning and at bedtime.    [provider]  pramipexole (MIRAPEX) 0.5 MG tablet Take 1 mg by mouth every evening.    [provider]  temazepam (RESTORIL) 15 MG capsule Take 1 capsule (15 mg total) by mouth at bedtime. 12/02/16   Love, Ivan Anchors,  PA-C  Turmeric Curcumin 500 MG CAPS Take 500 mg by mouth at bedtime.  [provider]  vitamin E 180 MG (400 UNITS) capsule Take 400 Units by mouth at bedtime.    [provider]    Family History    Family History  Problem Relation Age of Onset   Congestive Heart Failure Mother    Pancreatic cancer Father    She indicated that her mother is deceased. She indicated that her father is deceased.  Social History    Social History   Socioeconomic History   Marital status: Married    Spouse name: Not on file   Number of children: Not on file   Years of education: Not on file   Highest education level: Not on file  Occupational History   Not on file  Tobacco Use   Smoking status: Never   Smokeless tobacco: Never   Tobacco comments:    never used tobacco  Vaping Use   Vaping Use: Never used  Substance and Sexual Activity   Alcohol use: No    Alcohol/week: 0.0 standard drinks of alcohol   Drug use: No   Sexual activity: Not Currently  Other Topics Concern   Not on file  Social History Narrative   Not on file   Social Determinants of Health   Financial Resource Strain: Not on file  Food Insecurity: Not on file  Transportation Needs: Not on file  Physical Activity: Not on file  Stress: Not on file  Social Connections: Not on file  Intimate Partner Violence: Not on file     Review of Systems    General:  No chills, fever, night sweats or weight changes.  Cardiovascular:  No chest pain, dyspnea on exertion, edema, orthopnea, palpitations, paroxysmal nocturnal dyspnea. Dermatological: No rash, lesions/masses Respiratory: No cough, dyspnea Urologic: No hematuria, dysuria Abdominal:   No nausea, vomiting, diarrhea, bright red blood per rectum, melena, or hematemesis Neurologic:  No visual changes, wkns, changes in mental status. All other systems reviewed and are otherwise negative except as noted above.  Physical Exam    VS:  There were no  vitals taken for this visit. , BMI There is no height or weight on file to calculate BMI. GEN: Well nourished, well developed, in no acute distress. HEENT: normal. Neck: Supple, no JVD, carotid bruits, or masses. Cardiac: RRR, no murmurs, rubs, or gallops. No clubbing, cyanosis, bilateral lower extremity ankle edema.  Radials/DP/PT 2+ and equal bilaterally.  Respiratory:  Respirations regular and unlabored, clear to auscultation bilaterally. GI: Soft, nontender, nondistended, BS + x 4. MS: no deformity or atrophy. Skin: warm and dry, no rash. Neuro:  Strength and sensation are intact. Psych: Normal affect.  Accessory Clinical Findings    Recent Labs: 03/24/2022: ALT 20; Hemoglobin 10.8; Platelets 155 04/01/2022: BNP 288.9; BUN 28; Creatinine, Ser 1.22; Potassium 3.9; Sodium 143   Recent Lipid Panel    Component Value Date/Time   CHOL 152 07/10/2019 0525   TRIG 75 07/10/2019 0525   HDL 64 07/10/2019 0525   CHOLHDL 2.4 07/10/2019 0525   VLDL 15 07/10/2019 0525   LDLCALC 73 07/10/2019 0525    ECG personally reviewed by me today-sinus bradycardia left bundle branch block 57 bpm- No acute changes  EKG 11/11/2019 Normal sinus rhythm 73 bpm   Echocardiogram 06/20/2019   1. Mild global hypokinesis worse in the septum.. Left ventricular  ejection fraction, by estimation, is 45 to 50%. The left ventricle has  mildly decreased function. The left ventricle demonstrates global  hypokinesis. The left ventricular internal cavity  size was  mildly dilated. There is mild asymmetric left ventricular  hypertrophy. Left ventricular diastolic parameters are consistent with  Grade I diastolic dysfunction (impaired relaxation). Elevated left  ventricular end-diastolic pressure.   2. Right ventricular systolic function is normal. The right ventricular  size is normal. There is normal pulmonary artery systolic pressure.   3. Left atrial size was moderately dilated.   4. The mitral valve is normal  in structure and function. Mild mitral  valve regurgitation. No evidence of mitral stenosis.   5. The aortic valve is tricuspid. Aortic valve regurgitation is trivial.  No aortic stenosis is present.   6. The inferior vena cava is normal in size with greater than 50%  respiratory variability, suggesting right atrial pressure of 3 mmHg.   Cardiac catheterization 07/12/2019   Prox LAD lesion is 45% stenosed.   Mild coronary obstructive disease with a smooth area of 40 to 50% proximal LAD stenosis in the region of a large first diagonal vessel.  The remainder of the coronary arteries are angiographically normal including a large ramus intermediate vessel, left circumflex vessel, and dominant RCA.   Mild right heart pressure elevation with PA systolic pressure at 37 and mean pressure 25 mmHg.   LVEDP: 25 mmHg   RECOMMENDATION: Medical therapy for mild CAD involving her proximal LAD with guideline directed medical therapy for LV dysfunction.  Assessment & Plan   1.  Chronic systolic congestive heart failure-weight today*** 182 pounds.  Ankle edema.  Progress in physical activity after right total knee.  Reviewed echocardiogram 3/21.     Continue furosemide, losartan, metoprolol Increase furosemide to 160 mg x 2 days then resume normal dosing Take potassium 20 mill equivalents x2 days and then stop Heart healthy low-sodium diet-salty 6 given Increase physical activity as tolerated Daily weights-contact office with a weight increase of 3 pounds overnight or 5 pounds in 1 week Order BMP in 1 week.  Essential hypertension-BP today 102/60.  Continues to be well controlled at home. Continue metoprolol, losartan Heart healthy low-sodium diet-salty 6 given Increase physical activity as tolerated  Coronary artery disease-denies recent episodes of arm neck back or chest discomfort.  Underwent cardiac catheterization 07/12/2019 which showed proximal LAD 40% lesion. Continue aspirin, atorvastatin,  metoprolol, losartan Heart healthy low-sodium diet-salty 6 given Increase physical activity as tolerated    Disposition: Follow-up with Dr. Gwenlyn Found in 4 to 6 months.  [name] Timo Hartwig NP-C    04/14/2022, 2:58 PM [facility] [address] Office [phone] Fax [phone]  Notice: This dictation was prepared with Dragon dictation along with smaller phrase technology. Any transcriptional errors that result from this process are unintentional and may not be corrected upon review.  I spent 13*** minutes examining this patient, reviewing medications, and using patient centered shared decision making involving her cardiac care.  Prior to her visit I spent greater than 20 minutes reviewing her past medical history,  medications, and prior cardiac tests.

## 2022-04-22 ENCOUNTER — Ambulatory Visit: Payer: PPO | Admitting: General Practice

## 2022-04-22 DIAGNOSIS — Z95818 Presence of other cardiac implants and grafts: Secondary | ICD-10-CM | POA: Diagnosis not present

## 2022-04-22 DIAGNOSIS — Z86718 Personal history of other venous thrombosis and embolism: Secondary | ICD-10-CM | POA: Diagnosis not present

## 2022-04-22 DIAGNOSIS — Z7982 Long term (current) use of aspirin: Secondary | ICD-10-CM | POA: Diagnosis not present

## 2022-04-22 DIAGNOSIS — I2510 Atherosclerotic heart disease of native coronary artery without angina pectoris: Secondary | ICD-10-CM | POA: Diagnosis not present

## 2022-04-22 DIAGNOSIS — N1831 Chronic kidney disease, stage 3a: Secondary | ICD-10-CM | POA: Diagnosis not present

## 2022-04-22 DIAGNOSIS — D6852 Prothrombin gene mutation: Secondary | ICD-10-CM | POA: Diagnosis not present

## 2022-05-01 NOTE — Progress Notes (Signed)
Cardiology Clinic Note   Patient Name: [name] Date of Encounter: 05/02/2022  Primary Care Provider:  Shirline Frees, MD Primary Cardiologist:  Quay Burow, MD  Patient Profile    [name] 82 year old female presents to the clinic today for follow-up evaluation of her CHF.  Past Medical History    Past Medical History:  Diagnosis Date   Anemia    Arthritis    osteoarthritis Shoulder,neck   Bell's palsy 1980s   CHF (congestive heart failure) ([place]) 06/2019   Chronic kidney disease    Stage III   Dyspnea 06/2019   On exertion   GERD (gastroesophageal reflux disease)    Hemochromatosis    History of hiatal hernia    Hypertension    Iron deficiency anemia due to chronic blood loss 03/10/2017   PE (pulmonary embolism) 2007   x2   Peripheral vascular disease (HCC)    DVT Rt leg   Pneumonia 1990s   "walking" pneumonia   Prothrombin gene mutation ([place]) 05/30/2013   Restless legs    Right leg DVT ([place]) 05/30/2013   Stroke ([place])    found on a MRI, she's not aware otherwise   Past Surgical History:  Procedure Laterality Date   BACK SURGERY     CARDIAC CATHETERIZATION Bilateral 2005   COLONOSCOPY     IVC FILTER INSERTION N/A 03/12/2020   Procedure: IVC FILTER INSERTION;  Surgeon: Waynetta Sandy, MD;  Location: [facility];  Service: Cardiovascular;  Laterality: N/A;   IVC FILTER REMOVAL N/A 06/11/2020   Procedure: IVC FILTER REMOVAL;  Surgeon: Waynetta Sandy, MD;  Location: [facility];  Service: Cardiovascular;  Laterality: N/A;   REVERSE SHOULDER ARTHROPLASTY Right 03/07/2021   Procedure: REVERSE SHOULDER ARTHROPLASTY;  Surgeon: Justice Britain, MD;  Location: [facility];  Service: Orthopedics;  Laterality: Right;  161mn   RIGHT/LEFT HEART CATH AND CORONARY ANGIOGRAPHY N/A 07/12/2019   Procedure: RIGHT/LEFT HEART CATH AND CORONARY ANGIOGRAPHY;  Surgeon: KTroy Sine MD;  [facility];  Service:  Cardiovascular;  Laterality: N/A;   SHOULDER SURGERY Bilateral    TOTAL KNEE ARTHROPLASTY Right 03/16/2020   Procedure: TOTAL KNEE ARTHROPLASTY;  Surgeon: BSusa Day MD;  Location: [facility];  Service: Orthopedics;  Laterality: Right;  2.5 hrs    Allergies  Allergies  Allergen Reactions   Morphine And Related Other (See Comments)    LARGER DOSES OF MORPHINE CAUSES BODY TWITCHING   Penicillins Anaphylaxis    Childhood reaction Has patient had a PCN reaction causing immediate rash, facial/tongue/throat swelling, SOB or lightheadedness with hypotension: Yes Has patient had a PCN reaction causing severe rash involving mucus membranes or skin necrosis: Unknown Has patient had a PCN reaction that required hospitalization: No Has patient had a PCN reaction occurring within the last 10 years: no (5 or 82yrs old) If all of the above answers are "NO", then may proceed with Cephalosporin use.    Baclofen Other (See Comments)    Other reaction(s): Unknown   Celecoxib Other (See Comments)    Other reaction(s): Unknown   Doxycycline Other (See Comments)   Levofloxacin Other (See Comments)    Other reaction(s): abnl taste   Penicillin G Benzathine Other (See Comments)   Pneumococcal Vac Polyvalent Other (See Comments)    Other reaction(s): local reaction   Sulfa Antibiotics Other (See Comments)    Unknown childhood reaction   Sulfamethoxazole Other (See Comments)   Tetanus Toxoid, Adsorbed Other (See  Comments)    Other reaction(s): local reaction    History of Present Illness    [name] has a PMH of right leg DVT, hypertension, LBBB, CHF, AKI, hypokalemia, DOE, HLD, anemia, and total knee replacement. She may have also had a remote CVA.  Is noted to have progressive DOE over the last several years.  She was admitted to the hospital 3/21 for 4 days with CHF.  She underwent right and left heart cath by Dr. Claiborne Billings that showed 40% proximal LAD with elevated right atrial pressures,  pulmonary artery pressure, wedge pressure and LVEDP.  She was diuresed at that time.  Her admission weight on 06/03/2019 was 188.   She was  seen by Dr. Gwenlyn Found on 08/16/2019.  During that time she denied chest pain.  Her weight was 172 pounds.  She was following a low-sodium diet, and was taking furosemide 80 mg daily.  She denied shortness of breath.   She presented to the clinic 11/15/2019 for follow-up evaluation and stated she felt well .  She was having some right shoulder and right knee pain.  She was in the emergency department recently with right shoulder pain and was told it was related to arthritis.  She was also seeing orthopedics for her right knee pain.  She was planning to have a total knee replacement in the future.  She was unsure whether she would pursue this or not.  She stated that her blood pressure was better controlled and that she  had no chest discomfort.   She underwent IVC filter placement and bilateral renal veins 03/12/2020 by Dr. Donzetta Matters.  This was done prior to her right total knee arthroplasty.  Her IVC filters were removed 06/11/2020.   She presented to the clinic 08/02/20 for follow-up evaluation and stated she felt well.  She had been maintaining her low-sodium diet.  She reported that she  had some pain in her knee since having her knee replacement.  She also noted she was having nosebleeds and was taken off of her Eliquis.  She was taking 2 baby aspirin and had not had any further episodes of bleeding.  She reported that she had not been very physically active because she is worried about falls.  I asked her to use her walker on level ground and increase her physical activity.  I asked her to maintain her low-sodium diet, increase her physical activity, and planned follow-up in 6 months with Dr. Gwenlyn Found.  She contacted the clinic 08/08/2021 and reported weight gain.  She was instructed on low-sodium diet.  She presented  to the clinic 08/29/21 for follow-up evaluation and stated over  the last several days she had noticed swelling in her ankles.  She reported that she occasionally would eat out and was unable to control the amount of sodium in that food.  She was otherwise conscious and ate a low-sodium diet.  She reported that she was not very physically active.  She had been sleeping in a recliner due to the arthritis in her left shoulder.  She did notice some increased shortness of breath with increased physical activity and bending over.  She denied chest pain.  She had noticed some vaginal bleeding and had appointment with GYN the next week.  She denied orthopnea PND.  I increased her furosemide for 2 days, gave supplemental potassium for 2 days.  I gave her the salty 6 diet sheet ordered a BMP in 1 week and planned follow-up in 4 to 6  months.    She was seen in follow-up by Sande Rives, PA-C on 03/25/2022.  She had presented to the emergency department 03/24/2022 due to advice from home health nurse from her insurance company.  The nurse had concern for patient's kidney function and advised she present to the emergency department for lab work.  Her BNP was mildly elevated at 338.1.  Her chest x-ray showed a large hiatal hernia with no acute cardiopulmonary findings.  Her creatinine was 1.18 and she was felt to be stable for discharge.  In the cardiology clinic her dyspnea was noted to be stable and her chronic symptoms were reviewed.  She continues to be able to ambulate well as long as she was not rushing.  It was felt that possibly her hiatal hernia was contributing to her increased work of breathing with bending over.  She denied orthopnea and PND.  She continued to use lower extremity compression stockings for chronic lower extremity edema.  Her weight remains stable.  She continues to wear pessary for vaginal prolapse.  She was being followed by urology.  She did not feel that she was responding as well well to 80 mg of furosemide compared to when it was initially prescribed.   She reported that on days when she took an extra 40 mg furosemide she did have better response with urination.  She denied chest pain palpitations syncope and lightheadedness.  She presents to the clinic today for follow-up evaluation and states she continues to notice swelling in her ankles.  Her left continues to be worse than her right.  She has been very active over the holidays.  She reports that her son-in-law passed away and she is getting ready to go to his funeral.  She has been trying to monitor her sodium intake.  We reviewed the importance of fluid restriction and daily weights.  She expressed understanding.  I also encouraged her to elevate her lower extremities.  We reviewed her current medication regimen and she reports compliance.  I will give her a weight log, have her increase her physical activity as tolerated, give salty 6 diet sheet and plan follow-up in 4 to 6 months.  Today she denies chest pain, fatigue, palpitations, melena,  hemoptysis, diaphoresis, weakness, presyncope, syncope, orthopnea, and PND.  Home Medications    Prior to Admission medications   Medication Sig Start Date End Date Taking? Authorizing Provider  ALBUTEROL IN 1 tab    [provider]  allopurinol (ZYLOPRIM) 300 MG tablet Take 300 mg by mouth in the morning. 11/12/20   [provider]  ASPIRIN 81 PO     [provider]  aspirin EC 81 MG tablet Take 162 mg by mouth in the morning. Swallow whole.    [provider]  atorvastatin (LIPITOR) 40 MG tablet Take 1 tablet (40 mg total) by mouth daily at 6 PM. 07/13/19   Oswald Hillock, MD  Biotin 1000 MCG tablet Take 1,000 mcg by mouth in the morning.    [provider]  Calcium Carb-Cholecalciferol (CALCIUM 600+D3 PO) Take 1 tablet by mouth in the morning and at bedtime.    [provider]  cholecalciferol (VITAMIN D) 25 MCG (1000 UNIT) tablet Take 1,000 Units by mouth in the morning and at bedtime.    [provider]  citalopram (CELEXA) 20 MG tablet Take 20 mg by mouth at bedtime.  08/05/11   [provider]  colchicine 0.6 MG tablet Take 0.6 mg by mouth  daily as needed (gout flares). 01/20/20   [provider]  cyclobenzaprine (FLEXERIL) 10 MG tablet Take 10 mg by mouth 3 (three) times daily as needed for muscle spasms. 02/21/21   [provider]  cycloSPORINE (RESTASIS) 0.05 % ophthalmic emulsion Place 1 drop into both eyes 2 (two) times daily.    [provider]  ferrous sulfate 325 (65 FE) MG EC tablet Take 325 mg by mouth in the morning.    [provider]  furosemide (LASIX) 80 MG tablet Take 1 tablet (80 mg total) by mouth daily. 07/13/19 02/21/22  Oswald Hillock, MD  Hypromellose (ARTIFICIAL TEARS OP) Place 1 drop into both eyes 3 (three) times daily as needed (dry eyes).    [provider]  losartan (COZAAR) 50 MG tablet Take 1 tablet (50 mg total) by mouth daily. 07/13/19   Oswald Hillock, MD  metoprolol succinate (TOPROL-XL) 25 MG 24 hr tablet TAKE ONE TABLET BY MOUTH ONCE DAILY 08/01/21   Lorretta Harp, MD  Multiple Vitamin (MULTIVITAMIN WITH MINERALS) TABS tablet Take 1 tablet by mouth in the morning. Centrum Silver (NO IRON)    [provider]  Omega-3 Fatty Acids (FISH OIL PO) Take 1 capsule by mouth in the morning.    [provider]  omeprazole (PRILOSEC) 20 MG capsule Take 20 mg by mouth daily before breakfast.  07/04/19   [provider]  ondansetron (ZOFRAN) 4 MG tablet Take by mouth. 02/21/21   [provider]  oxyCODONE-acetaminophen (PERCOCET/ROXICET) 5-325 MG tablet Take 1 tablet by mouth daily as needed (pain.). 11/08/20   [provider]  polycarbophil (FIBERCON) 625 MG tablet Take 625 mg by mouth in the morning and at bedtime.    [provider]  pramipexole (MIRAPEX) 0.5 MG tablet Take 1 mg by mouth every evening.    [provider]  temazepam (RESTORIL) 15 MG  capsule Take 1 capsule (15 mg total) by mouth at bedtime. 12/02/16   Love, Ivan Anchors, PA-C  Turmeric Curcumin 500 MG CAPS Take 500 mg by mouth at bedtime.     [provider]  vitamin E 180 MG (400 UNITS) capsule Take 400 Units by mouth at bedtime.    [provider]    Family History    Family History  Problem Relation Age of Onset   Congestive Heart Failure Mother    Pancreatic cancer Father    She indicated that her mother is deceased. She indicated that her father is deceased.  Social History    Social History   Socioeconomic History   Marital status: Married    Spouse name: Not on file   Number of children: Not on file   Years of education: Not on file   Highest education level: Not on file  Occupational History   Not on file  Tobacco Use   Smoking status: Never   Smokeless tobacco: Never   Tobacco comments:    never used tobacco  Vaping Use   Vaping Use: Never used  Substance and Sexual Activity   Alcohol use: No    Alcohol/week: 0.0 standard drinks of alcohol   Drug use: No   Sexual activity: Not Currently  Other Topics Concern   Not on file  Social History Narrative   Not on file   Social Determinants of Health   Financial Resource Strain: Not on file  Food Insecurity: Not on file  Transportation Needs: Not on file  Physical Activity: Not on file  Stress: Not on file  Social Connections: Not on file  Intimate Partner Violence: Not on file     Review of Systems    General:  No chills, fever, night sweats or weight changes.  Cardiovascular:  No chest pain, dyspnea on exertion, edema, orthopnea, palpitations, paroxysmal nocturnal dyspnea. Dermatological: No rash, lesions/masses Respiratory: No cough, dyspnea Urologic: No hematuria, dysuria Abdominal:   No nausea, vomiting, diarrhea, bright red blood per rectum, melena, or hematemesis Neurologic:  No visual changes, wkns, changes in mental status. All other systems reviewed and are  otherwise negative except as noted above.  Physical Exam    VS:  BP 116/61 (BP Location: Left Arm, Patient Position: Sitting, Cuff Size: Normal)   Pulse 64   Ht '5\' 2"'$  (1.575 m)   Wt 187 lb (84.8 kg)   SpO2 97%   BMI 34.20 kg/m  , BMI Body mass index is 34.2 kg/m. GEN: Well nourished, well developed, in no acute distress. HEENT: normal. Neck: Supple, no JVD, carotid bruits, or masses. Cardiac: RRR, no murmurs, rubs, or gallops. No clubbing, cyanosis, bilateral lower extremity ankle edema left greater than right.  Radials/DP/PT 2+ and equal bilaterally.  Respiratory:  Respirations regular and unlabored, clear to auscultation bilaterally. GI: Soft, nontender, nondistended, BS + x 4. MS: no deformity or atrophy. Skin: warm and dry, no rash. Neuro:  Strength and sensation are intact. Psych: Normal affect.  Accessory Clinical Findings    Recent Labs: 03/24/2022: ALT 20; Hemoglobin 10.8; Platelets 155 04/01/2022: BNP 288.9; BUN 28; Creatinine, Ser 1.22; Potassium 3.9; Sodium 143   Recent Lipid Panel    Component Value Date/Time   CHOL 152 07/10/2019 0525   TRIG 75 07/10/2019 0525   HDL 64 07/10/2019 0525   CHOLHDL 2.4 07/10/2019 0525   VLDL 15 07/10/2019 0525   LDLCALC 73 07/10/2019 0525    ECG personally reviewed by me today-none today.  EKG 08/29/2021 sinus bradycardia left bundle branch block 57 bpm- No acute changes  EKG 11/11/2019 Normal sinus rhythm 73 bpm   Echocardiogram 06/20/2019   1. Mild global hypokinesis worse in the septum.. Left ventricular  ejection fraction, by estimation, is 45 to 50%. The left ventricle has  mildly decreased function. The left ventricle demonstrates global  hypokinesis. The left ventricular internal cavity  size was mildly dilated. There is mild asymmetric left ventricular  hypertrophy. Left ventricular diastolic parameters are consistent with  Grade I diastolic dysfunction (impaired relaxation). Elevated left  ventricular  end-diastolic pressure.   2. Right ventricular systolic function is normal. The right ventricular  size is normal. There is normal pulmonary artery systolic pressure.   3. Left atrial size was moderately dilated.   4. The mitral valve is normal in structure and function. Mild mitral  valve regurgitation. No evidence of mitral stenosis.   5. The aortic valve is tricuspid. Aortic valve regurgitation is trivial.  No aortic stenosis is present.   6. The inferior vena cava is normal in size with greater than 50%  respiratory variability, suggesting right atrial pressure of 3 mmHg.   Cardiac catheterization 07/12/2019   Prox LAD lesion is 45% stenosed.   Mild coronary obstructive disease with a smooth area of 40 to 50% proximal LAD stenosis in the region of a large first diagonal vessel.  The remainder of the coronary arteries are angiographically normal including a large ramus intermediate vessel, left circumflex vessel, and dominant RCA.   Mild right heart pressure elevation with PA systolic pressure at 37  and mean pressure 25 mmHg.   LVEDP: 25 mmHg   RECOMMENDATION: Medical therapy for mild CAD involving her proximal LAD with guideline directed medical therapy for LV dysfunction.  Echocardiogram 01/14/2022  IMPRESSIONS     1. Prominent LV dys-synchrony related to LBBB. Left ventricular ejection  fraction, by estimation, is 40 to 45%. The left ventricle has mildly  decreased function. The left ventricle demonstrates global hypokinesis.  Left ventricular diastolic parameters  are consistent with Grade II diastolic dysfunction (pseudonormalization).   2. Right ventricular systolic function is normal. The right ventricular  size is normal. Tricuspid regurgitation signal is inadequate for assessing  PA pressure.   3. Left atrial size was moderately dilated.   4. The mitral valve is grossly normal. Mild to moderate mitral valve  regurgitation. No evidence of mitral stenosis.   5. The  aortic valve is tricuspid. Aortic valve regurgitation is not  visualized. No aortic stenosis is present.   6. The inferior vena cava is normal in size with greater than 50%  respiratory variability, suggesting right atrial pressure of 3 mmHg.   FINDINGS   Left Ventricle: Prominent LV dys-synchrony related to LBBB. Left  ventricular ejection fraction, by estimation, is 40 to 45%. The left  ventricle has mildly decreased function. The left ventricle demonstrates  global hypokinesis. Global longitudinal strain   performed but not reported based on interpreter judgement due to  suboptimal tracking. 3D left ventricular ejection fraction analysis  performed but not reported based on interpreter judgement due to  suboptimal tracking. The left ventricular internal  cavity size was normal in size. There is no left ventricular hypertrophy.  Abnormal (paradoxical) septal motion, consistent with left bundle branch  block. Left ventricular diastolic parameters are consistent with Grade II  diastolic dysfunction  (pseudonormalization).   Right Ventricle: The right ventricular size is normal. No increase in  right ventricular wall thickness. Right ventricular systolic function is  normal. Tricuspid regurgitation signal is inadequate for assessing PA  pressure.   Left Atrium: Left atrial size was moderately dilated.   Right Atrium: Right atrial size was normal in size.   Pericardium: There is no evidence of pericardial effusion.   Mitral Valve: The mitral valve is grossly normal. Mild to moderate mitral  valve regurgitation. No evidence of mitral valve stenosis.   Tricuspid Valve: The tricuspid valve is grossly normal. Tricuspid valve  regurgitation is trivial. No evidence of tricuspid stenosis.   Aortic Valve: The aortic valve is tricuspid. Aortic valve regurgitation is  not visualized. No aortic stenosis is present.   Pulmonic Valve: The pulmonic valve was grossly normal. Pulmonic valve   regurgitation is trivial. No evidence of pulmonic stenosis.   Aorta: The aortic root and ascending aorta are structurally normal, with  no evidence of dilitation.   Venous: The inferior vena cava is normal in size with greater than 50%  respiratory variability, suggesting right atrial pressure of 3 mmHg.   IAS/Shunts: The atrial septum is grossly normal.    Assessment & Plan   1.  Chronic systolic congestive heart failure-weight today 187 pounds stable from November.  Chronic ankle edemaleft greater than right.   Reviewed echocardiogram 9/23.     Continue furosemide, losartan, metoprolol Heart healthy low-sodium diet-salty 6 reviewed Increase physical activity as tolerated Daily weights-contact office with a weight increase of 3 pounds overnight or 5 pounds in 1 week Elevate lower extremities when not active. Will need repeat echocardiogram 10/24  Essential hypertension-BP today 116/61.  Continues to  be well controlled at home. Continue metoprolol, losartan Heart healthy low-sodium diet Increase physical activity as tolerated  Coronary artery disease-no chest pain today.  Denies recent episodes of chest discomfort.  With cardiac catheterization 07/12/2019 which showed proximal LAD 40% lesion. Continue aspirin, atorvastatin, metoprolol, losartan Heart healthy low-sodium diet  Increase physical activity as tolerated    Disposition: Follow-up with Dr. Gwenlyn Found in  6 months.  [name] Eshaal Duby NP-C    05/02/2022, 8:24 AM [place] [address] Office [phone] Fax [phone]  Notice: This dictation was prepared with Dragon dictation along with smaller phrase technology. Any transcriptional errors that result from this process are unintentional and may not be corrected upon review.  I spent 14 minutes examining this patient, reviewing medications, and using patient centered shared decision making involving her cardiac care.  Prior to  her visit I spent greater than 20 minutes reviewing her past medical history,  medications, and prior cardiac tests.

## 2022-05-02 ENCOUNTER — Encounter: Payer: Self-pay | Admitting: General Practice

## 2022-05-02 ENCOUNTER — Ambulatory Visit: Payer: PPO | Attending: Cardiovascular Disease | Admitting: General Practice

## 2022-05-02 VITALS — BP 116/61 | HR 64 | Ht 62.0 in | Wt 187.0 lb

## 2022-05-02 DIAGNOSIS — I2510 Atherosclerotic heart disease of native coronary artery without angina pectoris: Secondary | ICD-10-CM | POA: Diagnosis not present

## 2022-05-02 DIAGNOSIS — I5042 Chronic combined systolic (congestive) and diastolic (congestive) heart failure: Secondary | ICD-10-CM | POA: Diagnosis not present

## 2022-05-02 DIAGNOSIS — I10 Essential (primary) hypertension: Secondary | ICD-10-CM | POA: Diagnosis not present

## 2022-05-02 NOTE — Patient Instructions (Signed)
Medication Instructions:  The current medical regimen is effective;  continue present plan and medications as directed. Please refer to the Current Medication list given to you today.  *If you need a refill on your cardiac medications before your next appointment, please call your pharmacy*  Lab Work: NONE If you have labs (blood work) drawn today and your tests are completely normal, you will receive your results only by:  [facility] (if you have MyChart) ORA paper copy in the mail If you have any lab test that is abnormal or we need to change your treatment, we will call you to review the results.  Testing/Procedures: NONE  Other Instructions TAKE AND LOG YOUR WEIGHT DAILY-CALL IF >2 POUNDS/OVERNIGHT OR 5 POUNDS/WEEKLY  ELEVATE LOWER EXTREMITIES WHEN SITTING  PLEASE READ AND FOLLOW ATTACHED  SALTY 6  Follow-Up: At [facility], you and your health needs are our priority.  As part of our continuing mission to provide you with exceptional heart care, we have created designated Provider Care Teams.  These Care Teams include your primary Cardiologist (physician) and Advanced Practice Providers (APPs -  Physician Assistants and Nurse Practitioners) who all work together to provide you with the care you need, when you need it.  We recommend signing up for the patient portal called "MyChart".  Sign up information is provided on this After Visit Summary.  MyChart is used to connect with patients for Virtual Visits (Telemedicine).  Patients are able to view lab/test results, encounter notes, upcoming appointments, etc.  Non-urgent messages can be sent to your provider as well.   To learn more about what you can do with MyChart, go to [email or web address].    Your next appointment:   4-6 month(s)  The format for your next appointment:   In Person  Provider:   Quay Burow, MD  ONLY       Important Information About Sugar

## 2022-05-26 DIAGNOSIS — I5043 Acute on chronic combined systolic (congestive) and diastolic (congestive) heart failure: Secondary | ICD-10-CM | POA: Diagnosis not present

## 2022-05-26 DIAGNOSIS — I10 Essential (primary) hypertension: Secondary | ICD-10-CM | POA: Diagnosis not present

## 2022-05-26 DIAGNOSIS — N183 Chronic kidney disease, stage 3 unspecified: Secondary | ICD-10-CM | POA: Diagnosis not present

## 2022-05-26 DIAGNOSIS — E7800 Pure hypercholesterolemia, unspecified: Secondary | ICD-10-CM | POA: Diagnosis not present

## 2022-05-30 DIAGNOSIS — N183 Chronic kidney disease, stage 3 unspecified: Secondary | ICD-10-CM | POA: Diagnosis not present

## 2022-05-30 DIAGNOSIS — I5043 Acute on chronic combined systolic (congestive) and diastolic (congestive) heart failure: Secondary | ICD-10-CM | POA: Diagnosis not present

## 2022-05-30 DIAGNOSIS — E7800 Pure hypercholesterolemia, unspecified: Secondary | ICD-10-CM | POA: Diagnosis not present

## 2022-05-30 DIAGNOSIS — K219 Gastro-esophageal reflux disease without esophagitis: Secondary | ICD-10-CM | POA: Diagnosis not present

## 2022-05-30 DIAGNOSIS — I10 Essential (primary) hypertension: Secondary | ICD-10-CM | POA: Diagnosis not present

## 2022-05-30 DIAGNOSIS — G250 Essential tremor: Secondary | ICD-10-CM | POA: Diagnosis not present

## 2022-05-30 DIAGNOSIS — F329 Major depressive disorder, single episode, unspecified: Secondary | ICD-10-CM | POA: Diagnosis not present

## 2022-05-30 DIAGNOSIS — G259 Extrapyramidal and movement disorder, unspecified: Secondary | ICD-10-CM | POA: Diagnosis not present

## 2022-05-30 DIAGNOSIS — Z8739 Personal history of other diseases of the musculoskeletal system and connective tissue: Secondary | ICD-10-CM | POA: Diagnosis not present

## 2022-05-30 DIAGNOSIS — G4700 Insomnia, unspecified: Secondary | ICD-10-CM | POA: Diagnosis not present

## 2022-07-01 DIAGNOSIS — Z4689 Encounter for fitting and adjustment of other specified devices: Secondary | ICD-10-CM | POA: Diagnosis not present

## 2022-07-01 DIAGNOSIS — N816 Rectocele: Secondary | ICD-10-CM | POA: Diagnosis not present

## 2022-07-01 DIAGNOSIS — N398 Other specified disorders of urinary system: Secondary | ICD-10-CM | POA: Diagnosis not present

## 2022-07-01 DIAGNOSIS — N952 Postmenopausal atrophic vaginitis: Secondary | ICD-10-CM | POA: Diagnosis not present

## 2022-07-08 DIAGNOSIS — S81801A Unspecified open wound, right lower leg, initial encounter: Secondary | ICD-10-CM | POA: Diagnosis not present

## 2022-07-15 ENCOUNTER — Inpatient Hospital Stay: Payer: PPO | Attending: Family

## 2022-07-15 ENCOUNTER — Inpatient Hospital Stay (HOSPITAL_BASED_OUTPATIENT_CLINIC_OR_DEPARTMENT_OTHER): Payer: PPO | Admitting: Family

## 2022-07-15 ENCOUNTER — Encounter: Payer: Self-pay | Admitting: Family

## 2022-07-15 ENCOUNTER — Other Ambulatory Visit: Payer: Self-pay

## 2022-07-15 ENCOUNTER — Inpatient Hospital Stay: Payer: PPO | Admitting: Family

## 2022-07-15 ENCOUNTER — Inpatient Hospital Stay: Payer: PPO

## 2022-07-15 VITALS — BP 130/68 | HR 79 | Temp 98.2°F | Resp 18 | Ht 62.0 in | Wt 190.0 lb

## 2022-07-15 DIAGNOSIS — D6852 Prothrombin gene mutation: Secondary | ICD-10-CM

## 2022-07-15 DIAGNOSIS — Z7982 Long term (current) use of aspirin: Secondary | ICD-10-CM | POA: Diagnosis not present

## 2022-07-15 DIAGNOSIS — D500 Iron deficiency anemia secondary to blood loss (chronic): Secondary | ICD-10-CM | POA: Diagnosis not present

## 2022-07-15 DIAGNOSIS — I82401 Acute embolism and thrombosis of unspecified deep veins of right lower extremity: Secondary | ICD-10-CM

## 2022-07-15 DIAGNOSIS — Z86718 Personal history of other venous thrombosis and embolism: Secondary | ICD-10-CM | POA: Insufficient documentation

## 2022-07-15 LAB — CMP (CANCER CENTER ONLY)
ALT: 23 U/L (ref 0–44)
AST: 39 U/L (ref 15–41)
Albumin: 3.8 g/dL (ref 3.5–5.0)
Alkaline Phosphatase: 103 U/L (ref 38–126)
Anion gap: 9 (ref 5–15)
BUN: 38 mg/dL — ABNORMAL HIGH (ref 8–23)
CO2: 28 mmol/L (ref 22–32)
Calcium: 9.7 mg/dL (ref 8.9–10.3)
Chloride: 98 mmol/L (ref 98–111)
Creatinine: 1.52 mg/dL — ABNORMAL HIGH (ref 0.44–1.00)
GFR, Estimated: 34 mL/min — ABNORMAL LOW (ref >60)
Glucose, Bld: 98 mg/dL (ref 70–99)
Potassium: 3.8 mmol/L (ref 3.5–5.1)
Sodium: 135 mmol/L (ref 135–145)
Total Bilirubin: 0.7 mg/dL (ref 0.3–1.2)
Total Protein: 7.7 g/dL (ref 6.5–8.1)

## 2022-07-15 LAB — FERRITIN: Ferritin: 36 ng/mL (ref 11–307)

## 2022-07-15 LAB — CBC WITH DIFFERENTIAL (CANCER CENTER ONLY)
Abs Immature Granulocytes: 0.06 10*3/uL (ref 0.00–0.07)
Basophils Absolute: 0 10*3/uL (ref 0.0–0.1)
Basophils Relative: 1 %
Eosinophils Absolute: 0.2 10*3/uL (ref 0.0–0.5)
Eosinophils Relative: 2 %
HCT: 36.5 % (ref 36.0–46.0)
Hemoglobin: 11.7 g/dL — ABNORMAL LOW (ref 12.0–15.0)
Immature Granulocytes: 1 %
Lymphocytes Relative: 22 %
Lymphs Abs: 1.3 10*3/uL (ref 0.7–4.0)
MCH: 29 pg (ref 26.0–34.0)
MCHC: 32.1 g/dL (ref 30.0–36.0)
MCV: 90.3 fL (ref 80.0–100.0)
Monocytes Absolute: 0.5 10*3/uL (ref 0.1–1.0)
Monocytes Relative: 8 %
Neutro Abs: 4.1 10*3/uL (ref 1.7–7.7)
Neutrophils Relative %: 66 %
Platelet Count: 171 10*3/uL (ref 150–400)
RBC: 4.04 MIL/uL (ref 3.87–5.11)
RDW: 14.9 % (ref 11.5–15.5)
WBC Count: 6.2 10*3/uL (ref 4.0–10.5)
nRBC: 0 % (ref 0.0–0.2)

## 2022-07-15 LAB — RETICULOCYTES
Immature Retic Fract: 11.7 % (ref 2.3–15.9)
RBC.: 4.09 MIL/uL (ref 3.87–5.11)
Retic Count, Absolute: 57.7 10*3/uL (ref 19.0–186.0)
Retic Ct Pct: 1.4 % (ref 0.4–3.1)

## 2022-07-15 NOTE — Progress Notes (Signed)
Hematology and Oncology Follow Up Visit  [name] DN:[id number] Oct 28, 1940 82 y.o. 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 She is doing fairly well but has noted some weight gain particularly in her legs. She states that her weight is up 5 lbs today per her scale and she plans to contact her cardiologist today. Per patient she is taking her Lasix as prescribed.  No redness or pitting edema noted. Pedal pulses are 1+.  She has occasional SOB with over exertion.  She also has been having GERD and will belch and sometimes vomit with this. She plans to contact our PCP  No blood loss noted. No abnormal bruising or petechiae.  No falls or syncope reported.  Appetite and hydration have been good.  ECOG Performance Status: 1 - Symptomatic but completely ambulatory  Medications:  Allergies as of 07/15/2022       Reactions   Morphine And Related Other (See Comments)   LARGER DOSES OF MORPHINE CAUSES BODY TWITCHING   Penicillins Anaphylaxis   Childhood reaction Has patient had a PCN reaction causing immediate rash, facial/tongue/throat swelling, SOB or lightheadedness with hypotension: Yes Has patient had a PCN reaction causing severe rash involving mucus membranes or skin necrosis: Unknown Has patient had a PCN reaction that required hospitalization: No Has patient had a PCN reaction occurring within the last 10 years: no (5 or 82 yrs old) If all of the above answers are "NO", then may proceed with Cephalosporin use.   Baclofen Other (See Comments)   Other reaction(s): Unknown   Celecoxib Other (See Comments)   Other reaction(s): Unknown   Doxycycline Other (See  Comments)   Levofloxacin Other (See Comments)   Other reaction(s): abnl taste   Penicillin G Benzathine Other (See Comments)   Pneumococcal Vac Polyvalent Other (See Comments)   Other reaction(s): local reaction   Sulfa Antibiotics Other (See Comments)   Unknown childhood reaction   Sulfamethoxazole Other (See Comments)   Tetanus Toxoid, Adsorbed Other (See Comments)   Other reaction(s): local reaction        Medication List        Accurate as of July 15, 2022  2:19 PM. If you have any questions, ask your nurse or doctor.          STOP taking these medications    ALBUTEROL IN Stopped by: Lottie Dawson, NP   citalopram 20 MG tablet Commonly known as: CELEXA Stopped by: Lottie Dawson, NP   colchicine 0.6 MG tablet Stopped by: Lottie Dawson, NP   cyclobenzaprine 10 MG tablet Commonly known as: FLEXERIL Stopped by: Lottie Dawson, NP       TAKE these medications    allopurinol 300 MG tablet Commonly known as: ZYLOPRIM Take 300 mg by mouth in the morning.   ARTIFICIAL TEARS OP Place 1 drop into both eyes 3 (three) times daily as needed (dry eyes).   aspirin EC 81 MG tablet Take 162 mg by mouth in the morning. Swallow whole.   atorvastatin 40 MG tablet Commonly known as: LIPITOR Take 1 tablet (40 mg total) by mouth daily at 6 PM.   Biotin 1000 MCG tablet  Take 1,000 mcg by mouth in the morning.   CALCIUM 600+D3 PO Take 1 tablet by mouth in the morning and at bedtime.   cholecalciferol 25 MCG (1000 UT) tablet Generic drug: Cholecalciferol Take 1,000 Units by mouth in the morning and at bedtime.   cycloSPORINE 0.05 % ophthalmic emulsion Commonly known as: RESTASIS Place 1 drop into both eyes 2 (two) times daily.   ferrous sulfate 325 (65 FE) MG EC tablet Take 325 mg by mouth in the morning.   FISH OIL PO Take 1 capsule by mouth in the morning.   furosemide 80 MG tablet Commonly known as: Lasix Take 1 tablet (80 mg total) by mouth every morning AND  0.5 tablets (40 mg total) every evening.   losartan 50 MG tablet Commonly known as: COZAAR Take 1 tablet (50 mg total) by mouth daily.   metoprolol succinate 25 MG 24 hr tablet Commonly known as: TOPROL-XL TAKE ONE TABLET BY MOUTH ONCE DAILY   multivitamin with minerals Tabs tablet Take 1 tablet by mouth in the morning. Centrum Silver (NO IRON)   omeprazole 20 MG capsule Commonly known as: PRILOSEC Take 20 mg by mouth daily before breakfast.   ondansetron 4 MG tablet Commonly known as: ZOFRAN Take by mouth.   oxyCODONE-acetaminophen 5-325 MG tablet Commonly known as: PERCOCET/ROXICET Take 1 tablet by mouth daily as needed (pain.).   polycarbophil 625 MG tablet Commonly known as: FIBERCON Take 625 mg by mouth in the morning and at bedtime.   potassium chloride SA 20 MEQ tablet Commonly known as: KLOR-CON M Take with furosemide on Friday and Saturday for two doses.   pramipexole 0.5 MG tablet Commonly known as: MIRAPEX Take 1 mg by mouth daily.   temazepam 15 MG capsule Commonly known as: RESTORIL Take 1 capsule (15 mg total) by mouth at bedtime.   Turmeric Curcumin 500 MG Caps Take 500 mg by mouth at bedtime.   vitamin E 180 MG (400 UNITS) capsule Take 400 Units by mouth at bedtime.        Allergies:  Allergies  Allergen Reactions   Morphine And Related Other (See Comments)    LARGER DOSES OF MORPHINE CAUSES BODY TWITCHING   Penicillins Anaphylaxis    Childhood reaction Has patient had a PCN reaction causing immediate rash, facial/tongue/throat swelling, SOB or lightheadedness with hypotension: Yes Has patient had a PCN reaction causing severe rash involving mucus membranes or skin necrosis: Unknown Has patient had a PCN reaction that required hospitalization: No Has patient had a PCN reaction occurring within the last 10 years: no (5 or 82 yrs old) If all of the above answers are "NO", then may proceed with Cephalosporin use.    Baclofen Other (See  Comments)    Other reaction(s): Unknown   Celecoxib Other (See Comments)    Other reaction(s): Unknown   Doxycycline Other (See Comments)   Levofloxacin Other (See Comments)    Other reaction(s): abnl taste   Penicillin G Benzathine Other (See Comments)   Pneumococcal Vac Polyvalent Other (See Comments)    Other reaction(s): local reaction   Sulfa Antibiotics Other (See Comments)    Unknown childhood reaction   Sulfamethoxazole Other (See Comments)   Tetanus Toxoid, Adsorbed Other (See Comments)    Other reaction(s): local reaction    Past Medical History, Surgical history, Social history, and Family History were reviewed and updated.  Review of Systems: All other 10 point review of systems is negative.   Physical Exam:  vitals were not  taken for this visit.   Wt Readings from Last 3 Encounters:  05/02/22 187 lb (84.8 kg)  03/25/22 187 lb (84.8 kg)  03/24/22 181 lb 3.5 oz (82.2 kg)    Ocular: Sclerae unicteric, pupils equal, round and reactive to light Ear-nose-throat: Oropharynx clear, dentition fair Lymphatic: No cervical or supraclavicular adenopathy Lungs no rales or rhonchi, good excursion bilaterally Heart regular rate and rhythm, no murmur appreciated Abd soft, nontender, positive bowel sounds MSK no focal spinal tenderness, no joint edema Neuro: non-focal, well-oriented, appropriate affect Breasts: Deferred   Lab Results  Component Value Date   WBC 6.2 07/15/2022   HGB 11.7 (L) 07/15/2022   HCT 36.5 07/15/2022   MCV 90.3 07/15/2022   PLT 171 07/15/2022   Lab Results  Component Value Date   FERRITIN 69 01/14/2022   IRON 106 01/14/2022   TIBC 256 01/14/2022   UIBC 150 01/14/2022   IRONPCTSAT 41 (H) 01/14/2022   Lab Results  Component Value Date   RETICCTPCT 1.4 07/15/2022   RBC 4.09 07/15/2022   RETICCTABS 39.7 05/11/2014   No results found for: "KPAFRELGTCHN", "LAMBDASER", "KAPLAMBRATIO" No results found for: "IGGSERUM", "IGA", "IGMSERUM" No  results found for: "TOTALPROTELP", "ALBUMINELP", "A1GS", "A2GS", "BETS", "BETA2SER", "GAMS", "MSPIKE", "SPEI"   Chemistry      Component Value Date/Time   NA 143 04/01/2022 1543   NA 144 04/13/2017 1134   NA 142 07/28/2016 1144   K 3.9 04/01/2022 1543   K 3.3 04/13/2017 1134   K 3.6 07/28/2016 1144   CL 99 04/01/2022 1543   CL 97 (L) 04/13/2017 1134   CO2 29 04/01/2022 1543   CO2 32 04/13/2017 1134   CO2 33 (H) 07/28/2016 1144   BUN 28 (H) 04/01/2022 1543   BUN 18 04/13/2017 1134   BUN 22.6 07/28/2016 1144   CREATININE 1.22 (H) 04/01/2022 1543   CREATININE 1.59 (H) 01/14/2022 1326   CREATININE 1.0 04/13/2017 1134   CREATININE 1.0 07/28/2016 1144      Component Value Date/Time   CALCIUM 10.2 04/01/2022 1543   CALCIUM 10.1 04/13/2017 1134   CALCIUM 9.5 07/28/2016 1144   ALKPHOS 89 03/24/2022 1315   ALKPHOS 103 (H) 04/13/2017 1134   ALKPHOS 84 07/28/2016 1144   AST 30 03/24/2022 1315   AST 39 01/14/2022 1326   AST 24 07/28/2016 1144   ALT 20 03/24/2022 1315   ALT 23 01/14/2022 1326   ALT 27 04/13/2017 1134   ALT 17 07/28/2016 1144   BILITOT 0.6 03/24/2022 1315   BILITOT 0.6 01/14/2022 1326   BILITOT 0.41 07/28/2016 1144       Impression and Plan: [name] is a very pleasant 82 yo caucasian female with a chronic DVT in the right lower extremity.  Continue to baby aspirin daily.  Iron studies pending.  Follow-up in 6 months.   Lottie Dawson, NP 3/19/20242:19 PM

## 2022-07-16 LAB — IRON AND IRON BINDING CAPACITY (CC-WL,HP ONLY)
Iron: 55 ug/dL (ref 28–170)
Saturation Ratios: 19 % (ref 10.4–31.8)
TIBC: 297 ug/dL (ref 250–450)
UIBC: 242 ug/dL (ref 148–442)

## 2022-07-17 ENCOUNTER — Telehealth: Payer: Self-pay | Admitting: Cardiovascular Disease

## 2022-07-17 NOTE — Telephone Encounter (Signed)
LVM to call office.

## 2022-07-17 NOTE — Telephone Encounter (Signed)
New Message:    Patient would like for Dr Gwenlyn Found to refer her to a GI doctor please.

## 2022-07-17 NOTE — Telephone Encounter (Signed)
Pt c/o swelling: STAT is pt has developed SOB within 24 hours  If swelling, where is the swelling located? Left foot andleg   How much weight have you gained and in what time span?   Have you gained 3 pounds in a day or 5 pounds in a week?   Do you have a log of your daily weights (if so, list)?   Are you currently taking a fluid pill? yes  Are you currently SOB? Short of breath when she get dressed and bend over to put her shoes  Have you traveled recently? no

## 2022-07-17 NOTE — Telephone Encounter (Signed)
Patient has two concerns  First she ask regarding swelling.  She states bilateral legs but then states the right foot is above the ankle from a dermatology procedure that still has swelling.  The left lower leg to foot has swelling ans hse states leaves indention that resolves slowly when pushed.  She dooes mention that is her "foot drop" foot.  She states she went to her cancer doctorTuesday and her weight was up by 5lbs to 188.  She weighs while on the phone and it is still 188 (she states 2 ounces less than last reading) No SOB with daily activities. She does state SOB when putting on her shoes and doing her makeup, but non when walking to bathroom or kitchen.  She returns from getting weight and no SOB noted.  She currently takes lasix 80 mg in the AM and 40 mg in the PM.  She ask recommendations regarding swelling. Appt. With D. Flasher 4/16.  Second question is she feels like some of her SOB is related to her Hiatal Hernia.  At the doctor appt. Tuesday, her O2 was at 100%.  She ask for GI referral to see if this is cause of SOB.  Advised I would ask if this referral needs to come from cardiology or from PCP.  Please advise

## 2022-07-17 NOTE — Telephone Encounter (Signed)
Responded in separate message.

## 2022-07-18 NOTE — Telephone Encounter (Signed)
Patient is aware she needs to contact PCP for GI referral.

## 2022-08-10 NOTE — Progress Notes (Unsigned)
Cardiology Clinic Note   Date: 08/12/2022 ID: ARYSS DIBLEY, DOB 11-Oct-1940, MRN [id number]  Primary Cardiologist:  Nanetta Batty, MD  Patient Profile    [name] is a 82 y.o. female who presents to the clinic today for increased lower extremity edema and   Past medical history significant for: Chronic systolic heart failure. R/LHC 07/12/2019: Mild right heart pressure elevation with PA systolic pressure at 37 and mean pressure 25 mmHg.  LVEDP 25 mmHg. Echo 01/14/2022: EF 40 to 45%.  Global hypokinesis.  Grade II DD.  Moderate LAE.  Mild to moderate MR. LBBB. Nonobstructive CAD. Nuclear stress test 07/08/2019: Small defect of mild severity present in the mid anteroseptal and apical septal location that is nonreversible, possibly related to underlying LBBB.  No ischemia noted.  EF severely decreased (<30%). R/LHC 07/12/2019: Proximal LAD 40 to 50% in the region of D1.  Recommend medical therapy. PE/right leg DVT. IVC filter insertion on 03/12/2020 for upcoming knee replacement. IVC filter removal 06/11/2020. Hypertension. Hyperlipidemia. Lipid panel 11/18/2021: LDL 38, HDL 85, TG 77, total 138. CVA. CKD stage III. GERD.   History of Present Illness    [name] was initially evaluated by Dr. Allyson Sabal on 06/03/2019 for DOE at the request of Dr. Tiburcio Pea.  Her echo showed mildly decreased LV function, asymmetric LVH, Grade I DD, elevated LVEDP.  She underwent nuclear stress testing and R/LHC as detailed above.  Patient has been followed by Dr. Allyson Sabal for the above outlined history.  Patient was last seen in the office by Edd Fabian, NP on 05/02/2022.  At that time she noted bilateral ankle edema L>R.  She was encouraged to weigh daily, limit dietary sodium, and increase physical activity as tolerated.  Plan for repeat echo October 2024.  Today, patient is alert today with complaints of increased lower extremity edema and increased shortness of breath.  Patient reports over the  last week she has noticed increased lower extremity edema in bilateral ankles and and increased difficulty with breathing.  She mainly notices her shortness of breath when she bends over.  This has been a longstanding problem for her however she feels when she returns to an upright position is taking longer for her to catch her breath.  She does not normally become short of breath with exertion.  Lower extremity edema is a chronic issue for her but over the last week bilateral feet and ankles are more edematous than usual.  She denies recent dietary indiscretion.  She stopped weighing every day.  In the last week or so or her weight has been 189, 187, 186.  She states "I called someone and told them about my weight but they did not seem to think it was a big deal."  She is uncertain if she called here or PCP.  She reports she has compression socks but does not usually wear them because she likes to go barefoot.  We discussed the possibility of wearing compression sleeves instead and she is going to look into that. She has brisk diuresis on current dose of lasix. She denies increased fatigue.  She is having some GI issues for which she is scheduled to see a GI provider.     ROS: All other systems reviewed and are otherwise negative except as noted in History of Present Illness.  Studies Reviewed    ECG is not ordered today.          Physical Exam    VS:  BP 124/66  Pulse 62   Ht 5\' 2"  (1.575 m)   Wt 191 lb 9.6 oz (86.9 kg)   SpO2 98%   BMI 35.04 kg/m  , BMI Body mass index is 35.04 kg/m.  GEN: Well nourished, well developed, in no acute distress. Neck: No JVD or carotid bruits. Cardiac:  RRR. No murmurs. No rubs or gallops.   Respiratory:  Respirations regular and unlabored. Clear to auscultation without rales, wheezing or rhonchi. GI: Soft, nontender, nondistended. Extremities: Radials/DP/PT 2+ and equal bilaterally. No clubbing or cyanosis. 1+ edema bilateral feet and ankles L>R.  Skin:  Warm and dry, no rash. Neuro: Strength intact.  Assessment & Plan   Chronic systolic heart failure/lower extremity edema/shortness of breath.  Echo September 2023 showed EF 40 to 45%, grade 2 DD.  Patient reports increased lower extremity edema and shortness of breath. Weight has been mostly stable with one high reading over the last couple of weeks. She reports brisk diuresis on current dose of lasix. She has 1+ edema to bilateral feet and ankles otherwise euvolemic and well compensated on exam.  Breath sounds clear to auscultation. She is due for for repeat echo October 2024.  Will move that up to get this sooner prior to follow-up with Dr. Allyson Sabal in the beginning of June. Instructed patient to weigh daily and contact office with weight gain of >3 lb overnight and >5 lb in a week. Continue metoprolol, losartan, Lasix. Nonobstructive CAD.  LHC March 2021 showed proximal LAD 40 to 50%.  Patient denies chest pain, pressure, or tightness. Continue aspirin, atorvastatin, metoprolol. Hypertension.  BP today 124/66. Patient denies headaches or dizziness.  Continue losartan and metoprolol.  Disposition: Echo. Daily weights. Contact office with gain of >3 lb overnight, >5 lb in a week. Salty 6 information sheet provided. Keep follow-up with Dr. Allyson Sabal as previously planned.         Signed, Etta Grandchild. Aquiles Ruffini, DNP, NP-C

## 2022-08-12 ENCOUNTER — Ambulatory Visit: Payer: PPO | Attending: Student | Admitting: Student

## 2022-08-12 ENCOUNTER — Encounter: Payer: Self-pay | Admitting: Student

## 2022-08-12 VITALS — BP 124/66 | HR 62 | Ht 62.0 in | Wt 191.6 lb

## 2022-08-12 DIAGNOSIS — R0602 Shortness of breath: Secondary | ICD-10-CM

## 2022-08-12 DIAGNOSIS — I10 Essential (primary) hypertension: Secondary | ICD-10-CM | POA: Diagnosis not present

## 2022-08-12 DIAGNOSIS — I2510 Atherosclerotic heart disease of native coronary artery without angina pectoris: Secondary | ICD-10-CM

## 2022-08-12 DIAGNOSIS — R600 Localized edema: Secondary | ICD-10-CM

## 2022-08-12 NOTE — Patient Instructions (Signed)
Medication Instructions:  Your physician recommends that you continue on your current medications as directed. Please refer to the Current Medication list given to you today.  *If you need a refill on your cardiac medications before your next appointment, please call your pharmacy*  Please take your weight daily. If you gain 3lbs overnight or 5lbs in a week please give our office a call.   Lab Work: NONE If you have labs (blood work) drawn today and your tests are completely normal, you will receive your results only by: MyChart Message (if you have MyChart) OR A paper copy in the mail If you have any lab test that is abnormal or we need to change your treatment, we will call you to review the results.   Testing/Procedures: Your physician has requested that you have an echocardiogram. Echocardiography is a painless test that uses sound waves to create images of your heart. It provides your doctor with information about the size and shape of your heart and how well your heart's chambers and valves are working. This procedure takes approximately one hour. There are no restrictions for this procedure. Please do NOT wear cologne, perfume, aftershave, or lotions (deodorant is allowed). Please arrive 15 minutes prior to your appointment time.    Follow-Up: At [facility], you and your health needs are our priority.  As part of our continuing mission to provide you with exceptional heart care, we have created designated Provider Care Teams.  These Care Teams include your primary Cardiologist (physician) and Advanced Practice Providers (APPs -  Physician Assistants and Nurse Practitioners) who all work together to provide you with the care you need, when you need it.  We recommend signing up for the patient portal called "MyChart".  Sign up information is provided on this After Visit Summary.  MyChart is used to connect with patients for Virtual Visits (Telemedicine).  Patients are able to  view lab/test results, encounter notes, upcoming appointments, etc.  Non-urgent messages can be sent to your provider as well.   To learn more about what you can do with MyChart, go to [email or web address].    Your next appointment:   Please keep upcoming appointment as scheduled in June with Dr. Allyson Sabal

## 2022-08-28 DIAGNOSIS — B961 Klebsiella pneumoniae [K. pneumoniae] as the cause of diseases classified elsewhere: Secondary | ICD-10-CM | POA: Diagnosis not present

## 2022-08-28 DIAGNOSIS — N398 Other specified disorders of urinary system: Secondary | ICD-10-CM | POA: Diagnosis not present

## 2022-08-28 DIAGNOSIS — N816 Rectocele: Secondary | ICD-10-CM | POA: Diagnosis not present

## 2022-08-28 DIAGNOSIS — R82998 Other abnormal findings in urine: Secondary | ICD-10-CM | POA: Diagnosis not present

## 2022-09-05 ENCOUNTER — Emergency Department (HOSPITAL_BASED_OUTPATIENT_CLINIC_OR_DEPARTMENT_OTHER): Payer: PPO

## 2022-09-05 ENCOUNTER — Emergency Department (HOSPITAL_BASED_OUTPATIENT_CLINIC_OR_DEPARTMENT_OTHER)
Admission: EM | Admit: 2022-09-05 | Discharge: 2022-09-05 | Disposition: A | Discharge status: Home / Self Care | Payer: PPO | Attending: Emergency Medicine | Admitting: Emergency Medicine

## 2022-09-05 ENCOUNTER — Encounter (HOSPITAL_BASED_OUTPATIENT_CLINIC_OR_DEPARTMENT_OTHER): Payer: Self-pay | Admitting: Emergency Medicine

## 2022-09-05 ENCOUNTER — Other Ambulatory Visit: Payer: Self-pay

## 2022-09-05 DIAGNOSIS — Z7982 Long term (current) use of aspirin: Secondary | ICD-10-CM | POA: Insufficient documentation

## 2022-09-05 DIAGNOSIS — M25512 Pain in left shoulder: Secondary | ICD-10-CM | POA: Diagnosis not present

## 2022-09-05 DIAGNOSIS — W19XXXA Unspecified fall, initial encounter: Secondary | ICD-10-CM

## 2022-09-05 DIAGNOSIS — S2232XA Fracture of one rib, left side, initial encounter for closed fracture: Secondary | ICD-10-CM | POA: Diagnosis not present

## 2022-09-05 DIAGNOSIS — R52 Pain, unspecified: Secondary | ICD-10-CM | POA: Diagnosis not present

## 2022-09-05 DIAGNOSIS — W1789XA Other fall from one level to another, initial encounter: Secondary | ICD-10-CM | POA: Diagnosis not present

## 2022-09-05 DIAGNOSIS — R0781 Pleurodynia: Secondary | ICD-10-CM | POA: Diagnosis not present

## 2022-09-05 MED ORDER — OXYCODONE-ACETAMINOPHEN 5-325 MG PO TABS: 1.0000 | ORAL_TABLET | Freq: Four times a day (QID) | ORAL | 0 refills | Status: DC | PRN

## 2022-09-05 MED ORDER — DOCUSATE SODIUM 100 MG PO CAPS: 100.0000 mg | ORAL_CAPSULE | Freq: Two times a day (BID) | ORAL | 0 refills | Status: DC

## 2022-09-05 MED ORDER — OXYCODONE-ACETAMINOPHEN 5-325 MG PO TABS
1.0000 | ORAL_TABLET | Freq: Once | ORAL | Status: AC
  Administered 2022-09-05: 1 via ORAL
  Filled 2022-09-05: qty 1

## 2022-09-05 NOTE — ED Provider Notes (Signed)
[place] [facility] Provider Note   CSN: [id number] Arrival date & time: 09/05/22  4098     History  Chief Complaint  Patient presents with   Marletta Lor    [name] is a 82 y.o. female.  HPI Reports that she has poor balance and fell last night.  She was trying to sit in her recliner chair and missed it.  She fell directly on the brick edge of the fireplace across her left upper back.  Patient was with her elderly husband at home.  She was unable to get back up by herself so she propped up on pillows and called her daughter at 7 in the morning.  Patient reports however this is the only area that she feels that she has injury.  She reports it hurts a lot if she twists in the upper back and takes a deep breath or coughs.  Does not feel short of breath at rest.  Not strike her head.  No confusion.  No lower extremity pain or deformity.  Does report that pain goes into her shoulder as well.  Patient takes daily aspirin but is otherwise not anticoagulated.    Home Medications Prior to Admission medications   Medication Sig Start Date End Date Taking? Authorizing Provider  docusate sodium (COLACE) 100 MG capsule Take 1 capsule (100 mg total) by mouth every 12 (twelve) hours. 09/05/22  Yes Arby Barrette, MD  oxyCODONE-acetaminophen (PERCOCET/ROXICET) 5-325 MG tablet Take 1 tablet by mouth every 6 (six) hours as needed for severe pain. 09/05/22  Yes Arby Barrette, MD  allopurinol (ZYLOPRIM) 300 MG tablet Take 300 mg by mouth in the morning. 11/12/20   [provider]  aspirin EC 81 MG tablet Take 162 mg by mouth in the morning. Swallow whole.    [provider]  atorvastatin (LIPITOR) 40 MG tablet Take 1 tablet (40 mg total) by mouth daily at 6 PM. 07/13/19   Meredeth Ide, MD  Biotin 1000 MCG tablet Take 1,000 mcg by mouth in the morning.    [provider]  Calcium Carb-Cholecalciferol (CALCIUM 600+D3 PO) Take 1 tablet by mouth  in the morning and at bedtime.    [provider]  cholecalciferol (VITAMIN D) 25 MCG (1000 UNIT) tablet Take 1,000 Units by mouth in the morning and at bedtime.    [provider]  cycloSPORINE (RESTASIS) 0.05 % ophthalmic emulsion Place 1 drop into both eyes 2 (two) times daily.    [provider]  ferrous sulfate 325 (65 FE) MG EC tablet Take 325 mg by mouth in the morning.    [provider]  furosemide (LASIX) 80 MG tablet Take 1 tablet (80 mg total) by mouth every morning AND 0.5 tablets (40 mg total) every evening. 03/25/22   Marjie Skiff E, PA-C  Hypromellose (ARTIFICIAL TEARS OP) Place 1 drop into both eyes 3 (three) times daily as needed (dry eyes).    [provider]  losartan (COZAAR) 50 MG tablet Take 1 tablet (50 mg total) by mouth daily. 07/13/19   Meredeth Ide, MD  metoprolol succinate (TOPROL-XL) 25 MG 24 hr tablet TAKE ONE TABLET BY MOUTH ONCE DAILY 11/11/21   Runell Gess, MD  Multiple Vitamin (MULTIVITAMIN WITH MINERALS) TABS tablet Take 1 tablet by mouth in the morning. Centrum Silver (NO IRON)    [provider]  Omega-3 Fatty Acids (FISH OIL PO) Take 1 capsule by mouth in the morning.  [provider]  omeprazole (PRILOSEC) 20 MG capsule Take 20 mg by mouth daily before breakfast.  07/04/19   [provider]  ondansetron (ZOFRAN) 4 MG tablet Take by mouth. 02/21/21   [provider]  oxyCODONE-acetaminophen (PERCOCET/ROXICET) 5-325 MG tablet Take 1 tablet by mouth daily as needed (pain.). 11/08/20   [provider]  polycarbophil (FIBERCON) 625 MG tablet Take 625 mg by mouth in the morning and at bedtime.    [provider]  potassium chloride SA (KLOR-CON M) 20 MEQ tablet Take with furosemide on Friday and Saturday for two doses. 08/29/21   Ronney Asters, NP  pramipexole (MIRAPEX) 0.5 MG tablet Take 1 mg by mouth daily.    [provider]  temazepam (RESTORIL) 15  MG capsule Take 1 capsule (15 mg total) by mouth at bedtime. 12/02/16   Love, Evlyn Kanner, PA-C  Turmeric Curcumin 500 MG CAPS Take 500 mg by mouth at bedtime.     [provider]  vitamin E 180 MG (400 UNITS) capsule Take 400 Units by mouth at bedtime.    [provider]      Allergies    Morphine and related; Penicillins; Baclofen; Celecoxib; Doxycycline; Levofloxacin; Penicillin g benzathine; Pneumococcal vac polyvalent; Sulfa antibiotics; Sulfamethoxazole; and Tetanus toxoid, adsorbed    Review of Systems   Review of Systems  Physical Exam Updated Vital Signs BP (!) 104/52   Pulse 65   Temp (!) 97.5 F (36.4 C) (Oral)   Resp 19   Wt 81.8 kg   SpO2 100%   BMI 32.98 kg/m  Physical Exam Constitutional:      Comments: Is alert.  Nontoxic.  No respiratory distress.  Clear mental status.  HENT:     Head: Normocephalic and atraumatic.     Mouth/Throat:     Pharynx: Oropharynx is clear.  Eyes:     Extraocular Movements: Extraocular movements intact.  Cardiovascular:     Rate and Rhythm: Normal rate and regular rhythm.  Pulmonary:     Effort: Pulmonary effort is normal.     Breath sounds: Normal breath sounds.     Comments: Sounds are symmetric.  Patient does not have respiratory distress.  She does have a deep purple ecchymotic band over her left upper thoracic rib cage.  It extends just from the corner of the axilla on the back across the scapula and ends before the thoracic spine.  No associated erythema or significant soft tissue swelling.  Area is very tender to palpation.  However, no tenderness to palpation to the anterior rib cage or sternum and no tenderness to palpation to the thoracic T-spine. Abdominal:     General: There is no distension.     Tenderness: There is no abdominal tenderness. There is no guarding.     Comments: No reproducible pain to deep palpation in the upper quadrants or percussion over the CVA angles.  Musculoskeletal:        General:  Normal range of motion.     Comments: Patient has pain with range of motion at the left shoulder however she can move the shoulder and it does not appear to have any deformity.  Skin:    General: Skin is warm and dry.  Neurological:     General: No focal deficit present.     Mental Status: She is oriented to person, place, and time.     Coordination: Coordination normal.  Psychiatric:        Mood and Affect:  Mood normal.     ED Results / Procedures / Treatments   Labs (all labs ordered are listed, but only abnormal results are displayed) Labs Reviewed - No data to display  EKG None  Radiology DG Scapula Left  Result Date: 09/05/2022 CLINICAL DATA:  Fall, pain EXAM: LEFT SCAPULA - 2+ VIEWS COMPARISON:  None Available. FINDINGS: There is no evidence of displaced fracture or other focal bone lesions. Soft tissues are unremarkable. IMPRESSION: No displaced fracture or dislocation of the left scapula. Consider CT to more sensitively evaluate if there is high clinical concern for fracture. Electronically Signed   By: Jearld Lesch M.D.   On: 09/05/2022 10:40   DG Ribs Unilateral W/Chest Left  Result Date: 09/05/2022 CLINICAL DATA:  Fall yesterday, left-sided rib pain. EXAM: LEFT RIBS AND CHEST - 3+ VIEW COMPARISON:  None Available. FINDINGS: Cortical irregularity of the left seventh rib suggesting mildly displaced fracture. There is no evidence of pneumothorax or pleural effusion. Status post right shoulder reverse arthroplasty. Both lungs are clear. Heart size and mediastinal contours are within normal limits. Moderate-sized hiatal hernia. IMPRESSION: Cortical irregularity of the left seventh rib suggesting mildly displaced fracture. No pneumothorax. Electronically Signed   By: Larose Hires D.O.   On: 09/05/2022 09:09    Procedures Procedures    Medications Ordered in ED Medications  oxyCODONE-acetaminophen (PERCOCET/ROXICET) 5-325 MG per tablet 1 tablet (1 tablet Oral Given 09/05/22  1011)    ED Course/ Medical Decision Making/ A&P                             Medical Decision Making Amount and/or Complexity of Data Reviewed Radiology: ordered.  Risk OTC drugs. Prescription drug management.   Patient had a mechanical fall as described above.  She is not anticoagulated.  Injury is limited to the posterior left thorax.  Respirations are symmetric on exam and patient is oxygenating 100%.  Will proceed with x-rays.  Patient has no reproducible pain of the solid organs with palpation and no pain to percussion over the flanks.  The area of injury visualized is consistent with a linear impact on the edge of the fireplace and does not extend much lower than the seventh rib.  This time I do not think that she needs CT scanning of the thorax or abdomen.  She is not anticoagulated.  Chest x-ray with rib series personally reviewed by myself and radiology review.  Radiology identifies 1 mildly displaced rib fracture at the seventh rib.  No pneumothorax.  I have also examined and do not appreciate other abnormality.  X-ray reviewed by radiology no acute fracture.  Patient has tolerated Percocet in the past when she had back surgery.  At this time will opt to treat with 1 Percocet initially, if needed and tolerated she may take 2 Percocet every 6 hours.  We also discussed the use of a combination of Percocet with 1 Tylenol in the edition of short course of low-dose ibuprofen.  All of this was reviewed with the patient's daughter and husband at bedside.  Patient's daughter and patient endorses agreement and understanding of the plan.  Patient will also use incentive spirometer.  Careful return precautions reviewed.        Final Clinical Impression(s) / ED Diagnoses Final diagnoses:  Fall, initial encounter  Closed fracture of one rib of left side, initial encounter    Rx / DC Orders ED Discharge Orders  Ordered    oxyCODONE-acetaminophen (PERCOCET/ROXICET) 5-325 MG  tablet  Every 6 hours PRN        09/05/22 1052    docusate sodium (COLACE) 100 MG capsule  Every 12 hours        09/05/22 1052              Arby Barrette, MD 09/05/22 1110

## 2022-09-05 NOTE — Discharge Instructions (Signed)
1.  Use the incentive spirometer as instructed. 2.  You may take 1-2 Percocet tablets every 6 hours for pain control.  If you get adequate pain control from extra strength Tylenol alone, you may discontinue the Percocet.  For added pain control while taking Percocet, you may take a low-dose of ibuprofen (400mg ) every 8 hours. 3.  Try to schedule a follow-up appoint with your doctor within the next 3 to 5 days. 4.  Return to emergency department immediately if you are getting difficulty breathing, fevers or worsening symptoms.

## 2022-09-05 NOTE — ED Notes (Signed)
   09/05/22 1044  Incentive Spirometry Tx  Level of Service Assisted by RCP  Frequency q1hr W/A  Treatment Tolerance Tolerated well  IS Goal (mL) (RN or RT) 1650 mL  IS - Achieved (mL) (RN, NT, or RT) 750 mL

## 2022-09-05 NOTE — ED Triage Notes (Signed)
Reports fall yesterday , left mid to upper back pain , Hx back surgery and multiple falls. Pt in obvious distress .

## 2022-09-12 ENCOUNTER — Ambulatory Visit (HOSPITAL_COMMUNITY): Payer: PPO

## 2022-09-12 DIAGNOSIS — Z9989 Dependence on other enabling machines and devices: Secondary | ICD-10-CM | POA: Diagnosis not present

## 2022-09-12 DIAGNOSIS — S2239XD Fracture of one rib, unspecified side, subsequent encounter for fracture with routine healing: Secondary | ICD-10-CM | POA: Diagnosis not present

## 2022-09-14 DIAGNOSIS — S2232XD Fracture of one rib, left side, subsequent encounter for fracture with routine healing: Secondary | ICD-10-CM | POA: Diagnosis not present

## 2022-09-18 ENCOUNTER — Other Ambulatory Visit: Payer: Self-pay | Admitting: Physician Assistant

## 2022-09-18 DIAGNOSIS — M810 Age-related osteoporosis without current pathological fracture: Secondary | ICD-10-CM

## 2022-09-25 DIAGNOSIS — N183 Chronic kidney disease, stage 3 unspecified: Secondary | ICD-10-CM | POA: Diagnosis not present

## 2022-09-25 DIAGNOSIS — I10 Essential (primary) hypertension: Secondary | ICD-10-CM | POA: Diagnosis not present

## 2022-09-25 DIAGNOSIS — E7800 Pure hypercholesterolemia, unspecified: Secondary | ICD-10-CM | POA: Diagnosis not present

## 2022-09-25 DIAGNOSIS — I5043 Acute on chronic combined systolic (congestive) and diastolic (congestive) heart failure: Secondary | ICD-10-CM | POA: Diagnosis not present

## 2022-10-03 ENCOUNTER — Ambulatory Visit: Payer: PPO | Admitting: Cardiovascular Disease

## 2022-10-06 ENCOUNTER — Ambulatory Visit (HOSPITAL_COMMUNITY): Payer: PPO

## 2022-10-09 DIAGNOSIS — J040 Acute laryngitis: Secondary | ICD-10-CM | POA: Diagnosis not present

## 2022-10-09 DIAGNOSIS — K2100 Gastro-esophageal reflux disease with esophagitis, without bleeding: Secondary | ICD-10-CM | POA: Diagnosis not present

## 2022-10-09 DIAGNOSIS — R051 Acute cough: Secondary | ICD-10-CM | POA: Diagnosis not present

## 2022-10-09 DIAGNOSIS — I509 Heart failure, unspecified: Secondary | ICD-10-CM | POA: Diagnosis not present

## 2022-10-10 ENCOUNTER — Ambulatory Visit (HOSPITAL_COMMUNITY): Payer: PPO | Attending: Student

## 2022-10-10 DIAGNOSIS — R0602 Shortness of breath: Secondary | ICD-10-CM | POA: Diagnosis not present

## 2022-10-10 DIAGNOSIS — R600 Localized edema: Secondary | ICD-10-CM | POA: Diagnosis not present

## 2022-10-10 LAB — ECHOCARDIOGRAM COMPLETE
Area-P 1/2: 3.96 cm2
S' Lateral: 4.25 cm

## 2022-10-16 ENCOUNTER — Ambulatory Visit: Payer: PPO | Attending: Physician Assistant | Admitting: Physician Assistant

## 2022-10-16 ENCOUNTER — Ambulatory Visit (HOSPITAL_COMMUNITY)
Admission: RE | Admit: 2022-10-16 | Discharge: 2022-10-16 | Disposition: A | Discharge status: Home / Self Care | Payer: PPO | Source: Ambulatory Visit | Attending: Physician Assistant | Admitting: Physician Assistant

## 2022-10-16 VITALS — BP 114/62 | HR 66 | Ht 62.5 in | Wt 182.4 lb

## 2022-10-16 DIAGNOSIS — M7989 Other specified soft tissue disorders: Secondary | ICD-10-CM

## 2022-10-16 DIAGNOSIS — I447 Left bundle-branch block, unspecified: Secondary | ICD-10-CM | POA: Diagnosis not present

## 2022-10-16 DIAGNOSIS — N179 Acute kidney failure, unspecified: Secondary | ICD-10-CM

## 2022-10-16 DIAGNOSIS — Z8673 Personal history of transient ischemic attack (TIA), and cerebral infarction without residual deficits: Secondary | ICD-10-CM | POA: Diagnosis not present

## 2022-10-16 DIAGNOSIS — I10 Essential (primary) hypertension: Secondary | ICD-10-CM

## 2022-10-16 DIAGNOSIS — E785 Hyperlipidemia, unspecified: Secondary | ICD-10-CM | POA: Diagnosis not present

## 2022-10-16 NOTE — Patient Instructions (Signed)
Medication Instructions:   Your physician recommends that you continue on your current medications as directed. Please refer to the Current Medication list given to you today.  *If you need a refill on your cardiac medications before your next appointment, please call your pharmacy*  Lab Work: Your physician recommends that you have lab work TODAY:  BMP  If you have labs (blood work) drawn today and your tests are completely normal, you will receive your results only by: MyChart Message (if you have MyChart) OR A paper copy in the mail If you have any lab test that is abnormal or we need to change your treatment, we will call you to review the results.  Testing/Procedures: Your physician has requested that you have a lower extremity venous duplex. This test is an ultrasound of the veins in the legs. It looks at venous blood flow that carries blood from the heart to the legs or arms. Allow one hour for a Lower Venous exam. Allow thirty minutes for an Upper Venous exam. There are no restrictions or special instructions.  Follow-Up: At [facility], you and your health needs are our priority.  As part of our continuing mission to provide you with exceptional heart care, we have created designated Provider Care Teams.  These Care Teams include your primary Cardiologist (physician) and Advanced Practice Providers (APPs -  Physician Assistants and Nurse Practitioners) who all work together to provide you with the care you need, when you need it.  Your next appointment:   6 month(s)  Provider:   Nanetta Batty, MD     Other Instructions

## 2022-10-16 NOTE — Progress Notes (Signed)
Cardiology Office Note:  .   Date:  10/18/2022  ID:  [name], DOB Aug 10, 1940, MRN [id number] PCP: Noberto Retort, MD  [facility] Providers Cardiologist:  Nanetta Batty, MD    History of Present Illness: .   [name] is a 82 y.o. female with past medical history of hypertension, hyperlipidemia, LBBB, history of CVA, iron deficiency anemia, CKD stage III, history of chronic RLE DVT/PE (s/p IVC filter 11/21, removed 05/2020), thrombophilia with prothrombin gene mutation, nonobstructive CAD and nonischemic cardiomyopathy.  She has been treated in the past with Xarelto for DVT/PE.  Echocardiogram obtained in February 2021 showed EF 45 to 50%, mild asymmetric LVH, grade 1 DD, normal PASP, mild MR.  Myoview obtained on 07/08/2019 showed EF 29%, small defect of mild severity present in the mid anteroseptal and apical septal location, the defect was nonreversible, no ischemia was noted.  Subsequent cardiac catheterization performed on 07/12/2019 showed 40 to 50% proximal LAD lesion, no other coronary artery disease was seen.  LVEDP 25 mmHg, mildly elevated right heart pressure with PASP 37 mmHg, mean pressure 25 mmHg.  Medical management was recommended for LV dysfunction.  She had an IVC filter placed on 03/12/2020 by Dr. Randie Heinz prior to her right total knee arthroplasty.  IVC filter was removed on 06/11/2020.  She was last seen by Dr. Allyson Sabal in September 2023 at which time she had 1+ ankle edema, Lasix was increased.  Subsequent echocardiogram showed EF of 40 to 45% with mild to moderate MR, normal RV, grade 2 DD, global hypokinesis.  She was seen by Marjie Skiff PA-C in November 2023, at which time she just released from the emergency room due to dyspnea, BNP was 333.  She was on 80 mg a.m. of Lasix and 40 mg p.m. every other day of Lasix.  Marjie Skiff discussed with the patient possibly switching her to torsemide to see if she has a better response, however she preferred to stay on  Lasix.  Her afternoon dose of Lasix was changed to daily at that time.  Patient was most recently seen by Carlos Levering NP on 08/12/2022 for worsening lower extremity edema.  Echocardiogram was ordered for 10/10/2022 which showed EF 40 to 45%, grade 1 DD, significant discontinued due to underlying LBBB, normal RV, severe LAE.  More recently, patient presented to the ED on 09/15/2022 with a fall.  It appears she try to sit in her recliner chair prior to the fall.  X-ray showed cortical irregularity of the left seventh rib suggesting mildly displaced fracture, no pneumothorax x-ray of the left scapula was negative for fracture.  She was treated with Percocet.  Patient presents today for follow-up.  She continues to have leg edema, primarily in the left lower extremity.  Previously venous Doppler in 2021 showed no evidence of DVT in the left lower extremity.  She has mentioned increasing leg edema recently.  I do not see any edema in the right lower extremity which is the side where she had a chronic DVT.  I will repeat a venous Doppler again.  Blood work obtained in March also shows that her renal function worsened slightly.  I will obtain a basic metabolic panel.  I discussed with her mostly conservative management such as low-salt intake and leg elevation.  I do not think she need higher dose of diuretic since she is already on a very high dose.  She is going to on the case with her daughter and grandkids this  weekend and will return in 1 week.  Overall, she is stable from the cardiac perspective and can follow-up with Dr. Allyson Sabal in 6 months.  I gave her the result of the recent echocardiogram.   ROS:   She denies chest pain, palpitations, dyspnea, pnd, orthopnea, n, v, dizziness, syncope, weight gain, or early satiety. All other systems reviewed and are otherwise negative except as noted above.    Studies Reviewed: Marland Kitchen   EKG Interpretation  Date/Time:  Thursday October 16 2022 10:38:28 EDT Ventricular  Rate:  66 PR Interval:  180 QRS Duration: 164 QT Interval:  456 QTC Calculation: 478 R Axis:   -13 Text Interpretation: Normal sinus rhythm Left bundle branch block Confirmed by Azalee Course [phone]) on 10/18/2022 11:00:48 PM    Cardiac Studies & Procedures   CARDIAC CATHETERIZATION  CARDIAC CATHETERIZATION 07/12/2019  Narrative  Prox LAD lesion is 45% stenosed.  Mild coronary obstructive disease with a smooth area of 40 to 50% proximal LAD stenosis in the region of a large first diagonal vessel.  The remainder of the coronary arteries are angiographically normal including a large ramus intermediate vessel, left circumflex vessel, and dominant RCA.  Mild right heart pressure elevation with PA systolic pressure at 37 and mean pressure 25 mmHg.  LVEDP: 25 mmHg  RECOMMENDATION: Medical therapy for mild CAD involving her proximal LAD with guideline directed medical therapy for LV dysfunction.  Findings Coronary Findings Diagnostic  Dominance: Right  Left Anterior Descending Prox LAD lesion is 45% stenosed.  Intervention  No interventions have been documented.   STRESS TESTS  MYOCARDIAL PERFUSION IMAGING 07/08/2019  Narrative  The left ventricular ejection fraction is severely decreased (<30%).  Nuclear stress EF: 29%.  There was no ST segment deviation noted during stress.  There is a small defect of mild severity present in the mid anteroseptal and apical septal location. The defect is non-reversible. This may be related to underlying LBBB. No ischemia noted.  This is a high risk study due to LV dysfunction.   ECHOCARDIOGRAM  ECHOCARDIOGRAM COMPLETE 10/10/2022  Narrative ECHOCARDIOGRAM REPORT    Patient Name:   [name] Date of Exam: 10/10/2022 Medical Rec #:  [id number]       Height:       62.0 in Accession #:    [id number]      Weight:       180.3 lb Date of Birth:  Aug 19, 1940        BSA:          1.829 m Patient Age:    82 years        BP:            124/66 mmHg Patient Gender: F               HR:           79 bpm. Exam Location:  [address]  Procedure: 2D Echo, 3D Echo, Cardiac Doppler and Color Doppler  Indications:    R60.0 Edema  History:        Patient has prior history of Echocardiogram examinations, most recent 01/14/2022. CHF, Abnormal ECG, PAD and Stroke, Arrythmias:LBBB, Signs/Symptoms:Edema and Shortness of Breath; Risk Factors:Family History of Coronary Artery Disease and Hypertension. DVT with Pulmonary Embolism (2007), IVC Filter, Dyssynchrony.  Sonographer:    Farrel Conners RDCS Referring Phys: Carlos Levering  IMPRESSIONS   1. Left ventricular ejection fraction, by estimation, is 40 to 45%. The left ventricle has mildly decreased function. The left  ventricle has no regional wall motion abnormalities. Left ventricular diastolic parameters are consistent with Grade I diastolic dysfunction (impaired relaxation). Significant dyssynchrony due to underlying LBBB. 2. Right ventricular systolic function is normal. The right ventricular size is normal. 3. Left atrial size was severely dilated. 4. The mitral valve is normal in structure. Trivial mitral valve regurgitation. No evidence of mitral stenosis. 5. The aortic valve is normal in structure. Aortic valve regurgitation is not visualized. No aortic stenosis is present. 6. The inferior vena cava is normal in size with greater than 50% respiratory variability, suggesting right atrial pressure of 3 mmHg.  FINDINGS Left Ventricle: Left ventricular ejection fraction, by estimation, is 40 to 45%. The left ventricle has mildly decreased function. The left ventricle has no regional wall motion abnormalities. The left ventricular internal cavity size was normal in size. There is no left ventricular hypertrophy. Abnormal (paradoxical) septal motion, consistent with left bundle branch block. Left ventricular diastolic parameters are consistent with Grade I diastolic  dysfunction (impaired relaxation).  Right Ventricle: The right ventricular size is normal. No increase in right ventricular wall thickness. Right ventricular systolic function is normal.  Left Atrium: Left atrial size was severely dilated.  Right Atrium: Right atrial size was normal in size.  Pericardium: There is no evidence of pericardial effusion.  Mitral Valve: The mitral valve is normal in structure. Trivial mitral valve regurgitation. No evidence of mitral valve stenosis.  Tricuspid Valve: The tricuspid valve is normal in structure. Tricuspid valve regurgitation is mild . No evidence of tricuspid stenosis.  Aortic Valve: The aortic valve is normal in structure. Aortic valve regurgitation is not visualized. No aortic stenosis is present.  Pulmonic Valve: The pulmonic valve was normal in structure. Pulmonic valve regurgitation is trivial. No evidence of pulmonic stenosis.  Aorta: The aortic root is normal in size and structure.  Venous: The inferior vena cava is normal in size with greater than 50% respiratory variability, suggesting right atrial pressure of 3 mmHg.  IAS/Shunts: No atrial level shunt detected by color flow Doppler.   LEFT VENTRICLE PLAX 2D LVIDd:         5.00 cm   Diastology LVIDs:         4.25 cm   LV e' medial:    4.03 cm/s LV PW:         0.75 cm   LV E/e' medial:  22.0 LV IVS:        0.70 cm   LV e' lateral:   5.28 cm/s LVOT diam:     2.00 cm   LV E/e' lateral: 16.8 LV SV:         71 LV SV Index:   39 LVOT Area:     3.14 cm  3D Volume EF: 3D EF:        46 % LV EDV:       222 ml LV ESV:       121 ml LV SV:        101 ml  RIGHT VENTRICLE RV Basal diam:  3.90 cm RV S prime:     17.40 cm/s TAPSE (M-mode): 2.9 cm  LEFT ATRIUM              Index        RIGHT ATRIUM           Index LA diam:        4.35 cm  2.38 cm/m   RA Pressure: 3.00 mmHg LA Vol (A2C):  105.0 ml 57.40 ml/m  RA Area:     16.90 cm LA Vol (A4C):   115.0 ml 62.87 ml/m  RA Volume:    43.20 ml  23.62 ml/m LA Biplane Vol: 110.0 ml 60.13 ml/m AORTIC VALVE LVOT Vmax:   112.50 cm/s LVOT Vmean:  74.200 cm/s LVOT VTI:    0.225 m  AORTA Ao Root diam: 2.70 cm Ao Asc diam:  3.50 cm  MITRAL VALVE                TRICUSPID VALVE MV Area (PHT): cm          Estimated RAP:  3.00 mmHg MV Decel Time: 192 msec MV E velocity: 88.80 cm/s   SHUNTS MV A velocity: 126.00 cm/s  Systemic VTI:  0.22 m MV E/A ratio:  0.70         Systemic Diam: 2.00 cm  Aditya Sabharwal Electronically signed by Dorthula Nettles Signature Date/Time: 10/10/2022/4:40:12 PM    Final             Risk Assessment/Calculations:            Physical Exam:   VS:  BP 114/62 (BP Location: Right Arm, Patient Position: Sitting, Cuff Size: Large)   Pulse 66   Ht 5' 2.5" (1.588 m)   Wt 182 lb 6.4 oz (82.7 kg)   SpO2 96%   BMI 32.83 kg/m    Wt Readings from Last 3 Encounters:  10/16/22 182 lb 6.4 oz (82.7 kg)  09/05/22 180 lb 4.8 oz (81.8 kg)  08/12/22 191 lb 9.6 oz (86.9 kg)    GEN: Well nourished, well developed in no acute distress NECK: No JVD; No carotid bruits CARDIAC: RRR, no murmurs, rubs, gallops RESPIRATORY:  Clear to auscultation without rales, wheezing or rhonchi  ABDOMEN: Soft, non-tender, non-distended EXTREMITIES: Left lower extremity edema; No deformity   ASSESSMENT AND PLAN: .    Leg edema: Patient has chronic right lower extremity DVT, however it is the left lower extremity that is currently having edema.  I recommended a venous Doppler.  She is already on a very high dose of diuretic, last renal function showed creatinine is elevated.  I recommend a repeat basic metabolic panel.  I recommended more conservative management with leg elevation and compression stocking rather than higher dose of diuretic.  Hypertension: Blood pressure well-controlled  AKI: Last blood work showed her creatinine is elevated, will obtain basic metabolic panel  Hyperlipidemia: On Lipitor  LBBB:  Unchanged on today's EKG  History of CVA: No recent recurrence.       Dispo: Follow-up with Dr. Allyson Sabal in 6 months  Signed, Azalee Course, [place]

## 2022-10-17 LAB — BASIC METABOLIC PANEL
BUN/Creatinine Ratio: 21 (ref 12–28)
BUN: 34 mg/dL — ABNORMAL HIGH (ref 8–27)
CO2: 27 mmol/L (ref 20–29)
Calcium: 9.6 mg/dL (ref 8.7–10.3)
Chloride: 97 mmol/L (ref 96–106)
Creatinine, Ser: 1.6 mg/dL — ABNORMAL HIGH (ref 0.57–1.00)
Glucose: 94 mg/dL (ref 70–99)
Potassium: 3.7 mmol/L (ref 3.5–5.2)
Sodium: 141 mmol/L (ref 134–144)
eGFR: 32 mL/min/{1.73_m2} — ABNORMAL LOW (ref >59)

## 2022-10-23 ENCOUNTER — Other Ambulatory Visit: Payer: Self-pay

## 2022-10-23 ENCOUNTER — Telehealth: Payer: Self-pay

## 2022-10-23 DIAGNOSIS — Z79899 Other long term (current) drug therapy: Secondary | ICD-10-CM

## 2022-10-23 MED ORDER — FUROSEMIDE 80 MG PO TABS: 80.0000 mg | ORAL_TABLET | Freq: Every day | ORAL | 3 refills | Status: DC

## 2022-10-23 NOTE — Addendum Note (Signed)
Addended by: Judene Companion on: 10/23/2022 04:08 PM   Modules accepted: Orders

## 2022-10-23 NOTE — Telephone Encounter (Addendum)
Left voice message for patient to give office a call back for lab results. Will try calling again.   ----- Message from Azalee Course, [place] sent at 10/19/2022 11:59 PM EDT ----- Renal function is essentially unchanged when compare to 3 month ago, however still slightly down when compare to her renal function from 6 month ago, will reduce lasix to 80mg  daily (previouslyly 80mg  AM and 40mg  PM). Repeat BMET in 1 month.

## 2022-10-23 NOTE — Telephone Encounter (Signed)
Patient returned CMA's call regarding results. 

## 2022-10-23 NOTE — Telephone Encounter (Addendum)
Patient aware lab results and provider recommendations. She verbalized understanding. BMET ordered.

## 2022-11-14 DIAGNOSIS — Z79899 Other long term (current) drug therapy: Secondary | ICD-10-CM | POA: Diagnosis not present

## 2022-11-15 LAB — BASIC METABOLIC PANEL
BUN/Creatinine Ratio: 28 (ref 12–28)
BUN: 41 mg/dL — ABNORMAL HIGH (ref 8–27)
CO2: 25 mmol/L (ref 20–29)
Calcium: 10.2 mg/dL (ref 8.7–10.3)
Chloride: 100 mmol/L (ref 96–106)
Creatinine, Ser: 1.49 mg/dL — ABNORMAL HIGH (ref 0.57–1.00)
Glucose: 133 mg/dL — ABNORMAL HIGH (ref 70–99)
Potassium: 4.2 mmol/L (ref 3.5–5.2)
Sodium: 142 mmol/L (ref 134–144)
eGFR: 35 mL/min/{1.73_m2} — ABNORMAL LOW (ref >59)

## 2022-11-15 LAB — SPECIMEN STATUS REPORT

## 2022-11-20 ENCOUNTER — Other Ambulatory Visit: Payer: Self-pay | Admitting: Family Medicine

## 2022-11-20 ENCOUNTER — Telehealth: Payer: Self-pay | Admitting: Cardiovascular Disease

## 2022-11-20 DIAGNOSIS — Z1231 Encounter for screening mammogram for malignant neoplasm of breast: Secondary | ICD-10-CM

## 2022-11-20 NOTE — Telephone Encounter (Signed)
Pt c/o Shortness Of Breath: STAT if SOB developed within the last 24 hours or pt is noticeably SOB on the phone  1. Are you currently SOB (can you hear that pt is SOB on the phone)? no  2. How long have you been experiencing SOB? Last night  3. Are you SOB when sitting or when up moving around? Move around   4. Are you currently experiencing any other symptoms? Acid reflex ,

## 2022-11-20 NOTE — Telephone Encounter (Signed)
I would like to see her earlier. Can you add a extra slot for 11:45AM next Tue 7/30. Thank you. If she does experience increased SOB, she may take extra half tablet of lasix in the afternoon for 2 days.

## 2022-11-20 NOTE — Telephone Encounter (Signed)
SOB started last night. No chest pain. No nausea or sweating. She states she got to where she was "rattly".  She thinks may be related to GERD or reflux.  States it sounds like she has asthma. She states slept in recliner and it improved. When got up this morning, it worsened when getting dressed.  Rested and resolved while resting.  Once up with activity it returns. She states some swelling in right leg with her foot drop but states the same as has been.  None in right foot.  She has an appt with PCP late August to be seen for GERD.  Should she see them first or set up with [place] for the next available. Please advise

## 2022-11-21 NOTE — Telephone Encounter (Signed)
Spoke with patient.  She is added for [name] on next Tuesday at 11:45 .

## 2022-11-24 DIAGNOSIS — I130 Hypertensive heart and chronic kidney disease with heart failure and stage 1 through stage 4 chronic kidney disease, or unspecified chronic kidney disease: Secondary | ICD-10-CM | POA: Diagnosis not present

## 2022-11-24 DIAGNOSIS — G4700 Insomnia, unspecified: Secondary | ICD-10-CM | POA: Diagnosis not present

## 2022-11-24 DIAGNOSIS — N183 Chronic kidney disease, stage 3 unspecified: Secondary | ICD-10-CM | POA: Diagnosis not present

## 2022-11-24 DIAGNOSIS — E7800 Pure hypercholesterolemia, unspecified: Secondary | ICD-10-CM | POA: Diagnosis not present

## 2022-11-24 DIAGNOSIS — Z8739 Personal history of other diseases of the musculoskeletal system and connective tissue: Secondary | ICD-10-CM | POA: Diagnosis not present

## 2022-11-24 DIAGNOSIS — G259 Extrapyramidal and movement disorder, unspecified: Secondary | ICD-10-CM | POA: Diagnosis not present

## 2022-11-24 DIAGNOSIS — Z0000 Encounter for general adult medical examination without abnormal findings: Secondary | ICD-10-CM | POA: Diagnosis not present

## 2022-11-24 DIAGNOSIS — K219 Gastro-esophageal reflux disease without esophagitis: Secondary | ICD-10-CM | POA: Diagnosis not present

## 2022-11-24 DIAGNOSIS — I509 Heart failure, unspecified: Secondary | ICD-10-CM | POA: Diagnosis not present

## 2022-11-24 DIAGNOSIS — G250 Essential tremor: Secondary | ICD-10-CM | POA: Diagnosis not present

## 2022-11-25 ENCOUNTER — Ambulatory Visit: Payer: PPO | Attending: Physician Assistant | Admitting: Physician Assistant

## 2022-11-25 ENCOUNTER — Encounter: Payer: Self-pay | Admitting: Physician Assistant

## 2022-11-25 VITALS — BP 130/70 | HR 71 | Ht 62.0 in | Wt 183.6 lb

## 2022-11-25 DIAGNOSIS — N183 Chronic kidney disease, stage 3 unspecified: Secondary | ICD-10-CM | POA: Diagnosis not present

## 2022-11-25 DIAGNOSIS — I10 Essential (primary) hypertension: Secondary | ICD-10-CM

## 2022-11-25 DIAGNOSIS — I82501 Chronic embolism and thrombosis of unspecified deep veins of right lower extremity: Secondary | ICD-10-CM | POA: Diagnosis not present

## 2022-11-25 DIAGNOSIS — E785 Hyperlipidemia, unspecified: Secondary | ICD-10-CM

## 2022-11-25 DIAGNOSIS — I428 Other cardiomyopathies: Secondary | ICD-10-CM

## 2022-11-25 DIAGNOSIS — R0609 Other forms of dyspnea: Secondary | ICD-10-CM | POA: Diagnosis not present

## 2022-11-25 DIAGNOSIS — I2510 Atherosclerotic heart disease of native coronary artery without angina pectoris: Secondary | ICD-10-CM | POA: Diagnosis not present

## 2022-11-25 NOTE — Progress Notes (Unsigned)
Cardiology Office Note:  .   Date:  11/25/2022  ID:  Archie Balboa, DOB Oct 02, 1940, MRN [id number] PCP: Noberto Retort, MD  [facility] Providers Cardiologist:  Nanetta Batty, MD { Click to update primary MD,subspecialty MD or APP then REFRESH:1}   History of Present Illness: .   EMON OSUCH is a 82 y.o. female with past medical history of hypertension, hyperlipidemia, LBBB, history of CVA, iron deficiency anemia, CKD stage III, history of chronic RLE DVT/PE (s/p IVC filter 11/21, removed 05/2020), thrombophilia with prothrombin gene mutation, nonobstructive CAD and nonischemic cardiomyopathy.  She has been treated in the past with Xarelto for DVT/PE.  Echocardiogram obtained in February 2021 showed EF 45 to 50%, mild asymmetric LVH, grade 1 DD, normal PASP, mild MR.  Myoview obtained on 07/08/2019 showed EF 29%, small defect of mild severity present in the mid anteroseptal and apical septal location, the defect was nonreversible, no ischemia was noted.  Subsequent cardiac catheterization performed on 07/12/2019 showed 40 to 50% proximal LAD lesion, no other coronary artery disease was seen.  LVEDP 25 mmHg, mildly elevated right heart pressure with PASP 37 mmHg, mean pressure 25 mmHg.  Medical management was recommended for LV dysfunction.  She had an IVC filter placed on 03/12/2020 by Dr. Randie Heinz prior to her right total knee arthroplasty.  IVC filter was removed on 06/11/2020.   She was last seen by Dr. Allyson Sabal in September 2023 at which time she had 1+ ankle edema, Lasix was increased.  Subsequent echocardiogram showed EF of 40 to 45% with mild to moderate MR, normal RV, grade 2 DD, global hypokinesis.  She was seen by Marjie Skiff PA-C in November 2023, at which time she just released from the emergency room due to dyspnea, BNP was 333.  She was on 80 mg a.m. of Lasix and 40 mg p.m. every other day of Lasix.  Marjie Skiff discussed with the patient possibly switching her to torsemide to  see if she has a better response, however she preferred to stay on Lasix.  Her afternoon dose of Lasix was changed to daily at that time.   Patient was most recently seen by Carlos Levering NP on 08/12/2022 for worsening lower extremity edema.  Echocardiogram was ordered for 10/10/2022 which showed EF 40 to 45%, grade 1 DD, significant discontinued due to underlying LBBB, normal RV, severe LAE.  More recently, patient presented to the ED on 09/15/2022 with a fall.  It appears she try to sit in her recliner chair prior to the fall.  X-ray showed cortical irregularity of the left seventh rib suggesting mildly displaced fracture, no pneumothorax x-ray of the left scapula was negative for fracture.  She was treated with Percocet.  I last saw the patient on 10/16/2022 at which time she continued to have leg edema, primarily in the left lower extremity.  She had a history of chronic low right lower extremity DVT.  I did not see any leg edema on the right side during the last office visit.  Repeat lower extremity venous Doppler obtained on 10/16/2022 shows chronic right lower extremity DVT unchanged compared to previous ultrasound in November 2019, September 2017 and in November 2016.  There was no evidence of DVT in the left lower extremity.  I suspect that she has some venous insufficiency in the left leg and recommended conservative management and leg leg elevation, low-salt diet and compression stocking.  She will contact our office last week complaining of shortness of breath.  Talking with the patient, she gets short of breath with more strenuous and activity but not with every day activity.  On physical exam, she appears to be euvolemic.  Although she is at risk for PE due to chronic DVT, hers O2 saturation is normal and heart rate is not elevated.  I recommended continue observation, if symptom worsens, she will need a VQ scan.  I would avoid a CTA of the chest given the her poor renal function.  She has  follow-up with hematology service in September.  She has not been on any anticoagulation therapy for chronic right lower extremity DVT.  Will defer the decision to hematology service.  She does not have any orthopnea or PND.  She is euvolemic on exam.  She has been instructed to take extra 40 mg of Lasix in the afternoon if she does have increased leg edema.  ROS: ***  Studies Reviewed: .        *** Risk Assessment/Calculations:   {Does this patient have ATRIAL FIBRILLATION?:[phone]}         Physical Exam:   VS:  BP 130/70 (BP Location: Left Arm, Patient Position: Sitting, Cuff Size: Normal)   Pulse 71   Ht 5\' 2"  (1.575 m)   Wt 183 lb 9.6 oz (83.3 kg)   SpO2 97%   BMI 33.58 kg/m    Wt Readings from Last 3 Encounters:  11/25/22 183 lb 9.6 oz (83.3 kg)  10/16/22 182 lb 6.4 oz (82.7 kg)  09/05/22 180 lb 4.8 oz (81.8 kg)    GEN: Well nourished, well developed in no acute distress NECK: No JVD; No carotid bruits CARDIAC: ***RRR, no murmurs, rubs, gallops RESPIRATORY:  Clear to auscultation without rales, wheezing or rhonchi  ABDOMEN: Soft, non-tender, non-distended EXTREMITIES:  No edema; No deformity   ASSESSMENT AND PLAN: .   ***    {Are you ordering a CV Procedure (e.g. stress test, cath, DCCV, TEE, etc)?   Press F2        :[id number]}  Dispo: ***  Signed, Azalee Course, PA

## 2022-11-25 NOTE — Patient Instructions (Signed)
Medication Instructions:  Your physician recommends that you continue on your current medications as directed. Please refer to the Current Medication list given to you today.   *If you need a refill on your cardiac medications before your next appointment, please call your pharmacy*   Lab Work: None ordered  If you have labs (blood work) drawn today and your tests are completely normal, you will receive your results only by: MyChart Message (if you have MyChart) OR A paper copy in the mail If you have any lab test that is abnormal or we need to change your treatment, we will call you to review the results.   Testing/Procedures: None ordered   Follow-Up: At [facility], you and your health needs are our priority.  As part of our continuing mission to provide you with exceptional heart care, we have created designated Provider Care Teams.  These Care Teams include your primary Cardiologist (physician) and Advanced Practice Providers (APPs -  Physician Assistants and Nurse Practitioners) who all work together to provide you with the care you need, when you need it.  We recommend signing up for the patient portal called "MyChart".  Sign up information is provided on this After Visit Summary.  MyChart is used to connect with patients for Virtual Visits (Telemedicine).  Patients are able to view lab/test results, encounter notes, upcoming appointments, etc.  Non-urgent messages can be sent to your provider as well.   To learn more about what you can do with MyChart, go to [email or web address].    Your next appointment:   As scheduled   Provider:   Nanetta Batty, MD     Other Instructions We want you to try elevating your legs.  They need to be as high as your heart when they are being elevated.  Also, try to wear compression socks as well.  If none of this works 1st, then you may take a extra 1/2 Lasix in the afternoon and let us know

## 2022-11-26 ENCOUNTER — Other Ambulatory Visit: Payer: Self-pay | Admitting: Physician Assistant

## 2022-11-26 DIAGNOSIS — S2239XD Fracture of one rib, unspecified side, subsequent encounter for fracture with routine healing: Secondary | ICD-10-CM

## 2022-11-30 ENCOUNTER — Other Ambulatory Visit: Payer: Self-pay | Admitting: Cardiovascular Disease

## 2022-12-09 DIAGNOSIS — N3000 Acute cystitis without hematuria: Secondary | ICD-10-CM | POA: Diagnosis not present

## 2022-12-09 DIAGNOSIS — R8290 Unspecified abnormal findings in urine: Secondary | ICD-10-CM | POA: Diagnosis not present

## 2022-12-09 DIAGNOSIS — N939 Abnormal uterine and vaginal bleeding, unspecified: Secondary | ICD-10-CM | POA: Diagnosis not present

## 2022-12-15 DIAGNOSIS — N939 Abnormal uterine and vaginal bleeding, unspecified: Secondary | ICD-10-CM | POA: Diagnosis not present

## 2022-12-23 DIAGNOSIS — Z96651 Presence of right artificial knee joint: Secondary | ICD-10-CM | POA: Diagnosis not present

## 2022-12-23 DIAGNOSIS — Z471 Aftercare following joint replacement surgery: Secondary | ICD-10-CM | POA: Diagnosis not present

## 2022-12-23 DIAGNOSIS — N950 Postmenopausal bleeding: Secondary | ICD-10-CM | POA: Diagnosis not present

## 2022-12-23 DIAGNOSIS — M1611 Unilateral primary osteoarthritis, right hip: Secondary | ICD-10-CM | POA: Diagnosis not present

## 2022-12-25 ENCOUNTER — Ambulatory Visit
Admission: RE | Admit: 2022-12-25 | Discharge: 2022-12-25 | Disposition: A | Discharge status: Home / Self Care | Payer: PPO | Source: Ambulatory Visit | Attending: Family Medicine | Admitting: Family Medicine

## 2022-12-25 DIAGNOSIS — Z1231 Encounter for screening mammogram for malignant neoplasm of breast: Secondary | ICD-10-CM

## 2023-01-05 DIAGNOSIS — N952 Postmenopausal atrophic vaginitis: Secondary | ICD-10-CM | POA: Diagnosis not present

## 2023-01-05 DIAGNOSIS — N939 Abnormal uterine and vaginal bleeding, unspecified: Secondary | ICD-10-CM | POA: Diagnosis not present

## 2023-01-05 DIAGNOSIS — N816 Rectocele: Secondary | ICD-10-CM | POA: Diagnosis not present

## 2023-01-08 DIAGNOSIS — N840 Polyp of corpus uteri: Secondary | ICD-10-CM | POA: Diagnosis not present

## 2023-01-08 DIAGNOSIS — N950 Postmenopausal bleeding: Secondary | ICD-10-CM | POA: Diagnosis not present

## 2023-01-08 DIAGNOSIS — R9389 Abnormal findings on diagnostic imaging of other specified body structures: Secondary | ICD-10-CM | POA: Diagnosis not present

## 2023-01-13 ENCOUNTER — Encounter: Payer: Self-pay | Admitting: Family

## 2023-01-13 ENCOUNTER — Inpatient Hospital Stay: Payer: PPO | Attending: Family

## 2023-01-13 ENCOUNTER — Inpatient Hospital Stay: Payer: PPO | Admitting: Family

## 2023-01-13 VITALS — BP 124/60 | HR 84 | Temp 97.8°F | Resp 18 | Wt 178.0 lb

## 2023-01-13 DIAGNOSIS — D6852 Prothrombin gene mutation: Secondary | ICD-10-CM

## 2023-01-13 DIAGNOSIS — D500 Iron deficiency anemia secondary to blood loss (chronic): Secondary | ICD-10-CM | POA: Diagnosis not present

## 2023-01-13 DIAGNOSIS — I82401 Acute embolism and thrombosis of unspecified deep veins of right lower extremity: Secondary | ICD-10-CM

## 2023-01-13 DIAGNOSIS — Z86718 Personal history of other venous thrombosis and embolism: Secondary | ICD-10-CM | POA: Insufficient documentation

## 2023-01-13 DIAGNOSIS — Z7982 Long term (current) use of aspirin: Secondary | ICD-10-CM | POA: Diagnosis not present

## 2023-01-13 LAB — CMP (CANCER CENTER ONLY)
ALT: 21 U/L (ref 0–44)
AST: 29 U/L (ref 15–41)
Albumin: 4.3 g/dL (ref 3.5–5.0)
Alkaline Phosphatase: 127 U/L — ABNORMAL HIGH (ref 38–126)
Anion gap: 11 (ref 5–15)
BUN: 40 mg/dL — ABNORMAL HIGH (ref 8–23)
CO2: 30 mmol/L (ref 22–32)
Calcium: 10.2 mg/dL (ref 8.9–10.3)
Chloride: 100 mmol/L (ref 98–111)
Creatinine: 1.32 mg/dL — ABNORMAL HIGH (ref 0.44–1.00)
GFR, Estimated: 40 mL/min — ABNORMAL LOW (ref >60)
Glucose, Bld: 105 mg/dL — ABNORMAL HIGH (ref 70–99)
Potassium: 3.7 mmol/L (ref 3.5–5.1)
Sodium: 141 mmol/L (ref 135–145)
Total Bilirubin: 0.7 mg/dL (ref 0.3–1.2)
Total Protein: 7.4 g/dL (ref 6.5–8.1)

## 2023-01-13 LAB — CBC WITH DIFFERENTIAL (CANCER CENTER ONLY)
Abs Immature Granulocytes: 0.02 10*3/uL (ref 0.00–0.07)
Basophils Absolute: 0 10*3/uL (ref 0.0–0.1)
Basophils Relative: 1 %
Eosinophils Absolute: 0.1 10*3/uL (ref 0.0–0.5)
Eosinophils Relative: 3 %
HCT: 37.5 % (ref 36.0–46.0)
Hemoglobin: 12.2 g/dL (ref 12.0–15.0)
Immature Granulocytes: 0 %
Lymphocytes Relative: 25 %
Lymphs Abs: 1.4 10*3/uL (ref 0.7–4.0)
MCH: 29.5 pg (ref 26.0–34.0)
MCHC: 32.5 g/dL (ref 30.0–36.0)
MCV: 90.6 fL (ref 80.0–100.0)
Monocytes Absolute: 0.5 10*3/uL (ref 0.1–1.0)
Monocytes Relative: 9 %
Neutro Abs: 3.6 10*3/uL (ref 1.7–7.7)
Neutrophils Relative %: 62 %
Platelet Count: 165 10*3/uL (ref 150–400)
RBC: 4.14 MIL/uL (ref 3.87–5.11)
RDW: 14.4 % (ref 11.5–15.5)
WBC Count: 5.7 10*3/uL (ref 4.0–10.5)
nRBC: 0 % (ref 0.0–0.2)

## 2023-01-13 LAB — FERRITIN: Ferritin: 88 ng/mL (ref 11–307)

## 2023-01-13 LAB — RETICULOCYTES
Immature Retic Fract: 12.5 % (ref 2.3–15.9)
RBC.: 4.18 MIL/uL (ref 3.87–5.11)
Retic Count, Absolute: 52.7 10*3/uL (ref 19.0–186.0)
Retic Ct Pct: 1.3 % (ref 0.4–3.1)

## 2023-01-13 NOTE — Progress Notes (Unsigned)
Hematology and Oncology Follow Up Visit  [name] [id number] May 12, 1940 82 y.o. 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 She is doing well and has no new complaints at this time.  She is tolerating her 2 baby aspirin daily nicely.  No blood loss, abnormal bruising or petechiae noted.  Mild SOB with over exertion. She takes a break to rest when needed.  No fever, chills, n/v, cough, rash, dizziness, chest pain, palpitations, abdominal pain or changes in bowel or bladder habits.  She is ambulating with her Rolator for added support.  No syncope reported, no new falls.  Appetite and hydration are good. Weight is stable at 178 lbs.   ECOG Performance Status: 1 - Symptomatic but completely ambulatory  Medications:  Allergies as of 01/13/2023       Reactions   Morphine And Codeine Other (See Comments)   LARGER DOSES OF MORPHINE CAUSES BODY TWITCHING   Penicillins Anaphylaxis   Childhood reaction Has patient had a PCN reaction causing immediate rash, facial/tongue/throat swelling, SOB or lightheadedness with hypotension: Yes Has patient had a PCN reaction causing severe rash involving mucus membranes or skin necrosis: Unknown Has patient had a PCN reaction that required hospitalization: No Has patient had a PCN reaction occurring within the last 10 years: no (5 or 82 yrs old) If all of the above answers are "NO", then may proceed with Cephalosporin use.   Baclofen Other (See Comments)   Other reaction(s): Unknown   Celecoxib Other (See Comments)   Other reaction(s): Unknown   Doxycycline Other (See Comments)   Levofloxacin Other (See Comments)   Other  reaction(s): abnl taste   Penicillin G Benzathine Other (See Comments)   Pneumococcal Vac Polyvalent Other (See Comments)   Other reaction(s): local reaction   Sulfa Antibiotics Other (See Comments)   Unknown childhood reaction   Sulfamethoxazole Other (See Comments)   Tetanus Toxoid, Adsorbed Other (See Comments)   Other reaction(s): local reaction        Medication List        Accurate as of January 13, 2023  1:18 PM. If you have any questions, ask your nurse or doctor.          allopurinol 300 MG tablet Commonly known as: ZYLOPRIM Take 300 mg by mouth in the morning.   ARTIFICIAL TEARS OP Place 1 drop into both eyes 3 (three) times daily as needed (dry eyes).   aspirin EC 81 MG tablet Take 162 mg by mouth in the morning. Swallow whole.   atorvastatin 40 MG tablet Commonly known as: LIPITOR Take 1 tablet (40 mg total) by mouth daily at 6 PM.   Biotin 1000 MCG tablet Take 1,000 mcg by mouth in the morning.   CALCIUM 600+D3 PO Take 1 tablet by mouth in the morning and at bedtime.   cholecalciferol 25 MCG (1000 UNIT) tablet Commonly known as: VITAMIN D3 Take 1,000 Units by mouth in the morning and at bedtime.   cycloSPORINE 0.05 % ophthalmic emulsion Commonly known as: RESTASIS Place 1 drop into both eyes 2 (two) times daily.   docusate sodium 100 MG capsule Commonly known as: COLACE  Take 1 capsule (100 mg total) by mouth every 12 (twelve) hours.   famotidine 40 MG tablet Commonly known as: PEPCID Take 40 mg by mouth daily.   ferrous sulfate 325 (65 FE) MG EC tablet Take 325 mg by mouth in the morning.   FISH OIL PO Take 1 capsule by mouth in the morning.   furosemide 80 MG tablet Commonly known as: Lasix Take 1 tablet (80 mg total) by mouth daily.   losartan 50 MG tablet Commonly known as: COZAAR Take 1 tablet (50 mg total) by mouth daily.   metoprolol succinate 25 MG 24 hr tablet Commonly known as: TOPROL-XL Take 1 tablet by mouth once  daily   multivitamin with minerals Tabs tablet Take 1 tablet by mouth in the morning. Centrum Silver (NO IRON)   omeprazole 20 MG capsule Commonly known as: PRILOSEC Take 20 mg by mouth daily before breakfast.   ondansetron 4 MG tablet Commonly known as: ZOFRAN Take by mouth.   oxyCODONE-acetaminophen 5-325 MG tablet Commonly known as: PERCOCET/ROXICET Take 1 tablet by mouth daily as needed (pain.).   oxyCODONE-acetaminophen 5-325 MG tablet Commonly known as: PERCOCET/ROXICET Take 1 tablet by mouth every 6 (six) hours as needed for severe pain.   polycarbophil 625 MG tablet Commonly known as: FIBERCON Take 625 mg by mouth in the morning and at bedtime.   potassium chloride SA 20 MEQ tablet Commonly known as: KLOR-CON M Take with furosemide on Friday and Saturday for two doses.   pramipexole 0.5 MG tablet Commonly known as: MIRAPEX Take 1 mg by mouth daily.   temazepam 15 MG capsule Commonly known as: RESTORIL Take 1 capsule (15 mg total) by mouth at bedtime.   Turmeric Curcumin 500 MG Caps Take 500 mg by mouth at bedtime.   vitamin E 180 MG (400 UNITS) capsule Take 400 Units by mouth at bedtime.        Allergies:  Allergies  Allergen Reactions   Morphine And Codeine Other (See Comments)    LARGER DOSES OF MORPHINE CAUSES BODY TWITCHING   Penicillins Anaphylaxis    Childhood reaction Has patient had a PCN reaction causing immediate rash, facial/tongue/throat swelling, SOB or lightheadedness with hypotension: Yes Has patient had a PCN reaction causing severe rash involving mucus membranes or skin necrosis: Unknown Has patient had a PCN reaction that required hospitalization: No Has patient had a PCN reaction occurring within the last 10 years: no (5 or 82 yrs old) If all of the above answers are "NO", then may proceed with Cephalosporin use.    Baclofen Other (See Comments)    Other reaction(s): Unknown   Celecoxib Other (See Comments)    Other reaction(s):  Unknown   Doxycycline Other (See Comments)   Levofloxacin Other (See Comments)    Other reaction(s): abnl taste   Penicillin G Benzathine Other (See Comments)   Pneumococcal Vac Polyvalent Other (See Comments)    Other reaction(s): local reaction   Sulfa Antibiotics Other (See Comments)    Unknown childhood reaction   Sulfamethoxazole Other (See Comments)   Tetanus Toxoid, Adsorbed Other (See Comments)    Other reaction(s): local reaction    Past Medical History, Surgical history, Social history, and Family History were reviewed and updated.  Review of Systems: All other 10 point review of systems is negative.   Physical Exam:  vitals were not taken for this visit.   Wt Readings from Last 3 Encounters:  11/25/22 183 lb 9.6 oz (83.3 kg)  10/16/22 182 lb  6.4 oz (82.7 kg)  09/05/22 180 lb 4.8 oz (81.8 kg)    Ocular: Sclerae unicteric, pupils equal, round and reactive to light Ear-nose-throat: Oropharynx clear, dentition fair Lymphatic: No cervical or supraclavicular adenopathy Lungs no rales or rhonchi, good excursion bilaterally Heart regular rate and rhythm, no murmur appreciated Abd soft, nontender, positive bowel sounds MSK no focal spinal tenderness, no joint edema Neuro: non-focal, well-oriented, appropriate affect Breasts: Deferred   Lab Results  Component Value Date   WBC 6.2 07/15/2022   HGB 11.7 (L) 07/15/2022   HCT 36.5 07/15/2022   MCV 90.3 07/15/2022   PLT 171 07/15/2022   Lab Results  Component Value Date   FERRITIN 36 07/15/2022   IRON 55 07/15/2022   TIBC 297 07/15/2022   UIBC 242 07/15/2022   IRONPCTSAT 19 07/15/2022   Lab Results  Component Value Date   RETICCTPCT 1.4 07/15/2022   RBC 4.09 07/15/2022   RETICCTABS 39.7 05/11/2014   No results found for: "KPAFRELGTCHN", "LAMBDASER", "KAPLAMBRATIO" No results found for: "IGGSERUM", "IGA", "IGMSERUM" No results found for: "TOTALPROTELP", "ALBUMINELP", "A1GS", "A2GS", "BETS", "BETA2SER", "GAMS",  "MSPIKE", "SPEI"   Chemistry      Component Value Date/Time   NA 142 11/14/2022 0000   NA 144 04/13/2017 1134   NA 142 07/28/2016 1144   K 4.2 11/14/2022 0000   K 3.3 04/13/2017 1134   K 3.6 07/28/2016 1144   CL 100 11/14/2022 0000   CL 97 (L) 04/13/2017 1134   CO2 25 11/14/2022 0000   CO2 32 04/13/2017 1134   CO2 33 (H) 07/28/2016 1144   BUN 41 (H) 11/14/2022 0000   BUN 18 04/13/2017 1134   BUN 22.6 07/28/2016 1144   CREATININE 1.49 (H) 11/14/2022 0000   CREATININE 1.52 (H) 07/15/2022 1411   CREATININE 1.0 04/13/2017 1134   CREATININE 1.0 07/28/2016 1144      Component Value Date/Time   CALCIUM 10.2 11/14/2022 0000   CALCIUM 10.1 04/13/2017 1134   CALCIUM 9.5 07/28/2016 1144   ALKPHOS 103 07/15/2022 1411   ALKPHOS 103 (H) 04/13/2017 1134   ALKPHOS 84 07/28/2016 1144   AST 39 07/15/2022 1411   AST 24 07/28/2016 1144   ALT 23 07/15/2022 1411   ALT 27 04/13/2017 1134   ALT 17 07/28/2016 1144   BILITOT 0.7 07/15/2022 1411   BILITOT 0.41 07/28/2016 1144       Impression and Plan: Ms. Lindt is a very pleasant 82 yo caucasian female with a chronic DVT in the right lower extremity.  Continue to baby aspirin daily.  Iron studies pending.  Follow-up in 6 months.   Eileen Stanford, NP 9/17/20241:18 PM

## 2023-01-14 ENCOUNTER — Encounter: Payer: Self-pay | Admitting: Hematology & Oncology

## 2023-01-14 ENCOUNTER — Inpatient Hospital Stay: Payer: PPO

## 2023-01-14 ENCOUNTER — Ambulatory Visit: Payer: PPO | Admitting: Family

## 2023-02-02 DIAGNOSIS — R3989 Other symptoms and signs involving the genitourinary system: Secondary | ICD-10-CM | POA: Diagnosis not present

## 2023-03-06 ENCOUNTER — Encounter (HOSPITAL_BASED_OUTPATIENT_CLINIC_OR_DEPARTMENT_OTHER): Payer: Self-pay | Admitting: Emergency Medicine

## 2023-03-06 ENCOUNTER — Emergency Department (HOSPITAL_BASED_OUTPATIENT_CLINIC_OR_DEPARTMENT_OTHER): Payer: PPO

## 2023-03-06 ENCOUNTER — Emergency Department (HOSPITAL_BASED_OUTPATIENT_CLINIC_OR_DEPARTMENT_OTHER)
Admission: EM | Admit: 2023-03-06 | Discharge: 2023-03-06 | Disposition: A | Discharge status: Home / Self Care | Payer: PPO | Attending: Emergency Medicine | Admitting: Emergency Medicine

## 2023-03-06 DIAGNOSIS — Z7982 Long term (current) use of aspirin: Secondary | ICD-10-CM | POA: Diagnosis not present

## 2023-03-06 DIAGNOSIS — M19011 Primary osteoarthritis, right shoulder: Secondary | ICD-10-CM | POA: Diagnosis not present

## 2023-03-06 DIAGNOSIS — Z96651 Presence of right artificial knee joint: Secondary | ICD-10-CM | POA: Diagnosis not present

## 2023-03-06 DIAGNOSIS — Z79899 Other long term (current) drug therapy: Secondary | ICD-10-CM | POA: Insufficient documentation

## 2023-03-06 DIAGNOSIS — W19XXXA Unspecified fall, initial encounter: Secondary | ICD-10-CM | POA: Insufficient documentation

## 2023-03-06 DIAGNOSIS — Z96611 Presence of right artificial shoulder joint: Secondary | ICD-10-CM | POA: Diagnosis not present

## 2023-03-06 DIAGNOSIS — I2510 Atherosclerotic heart disease of native coronary artery without angina pectoris: Secondary | ICD-10-CM | POA: Diagnosis not present

## 2023-03-06 DIAGNOSIS — I10 Essential (primary) hypertension: Secondary | ICD-10-CM | POA: Diagnosis not present

## 2023-03-06 DIAGNOSIS — M79601 Pain in right arm: Secondary | ICD-10-CM | POA: Diagnosis not present

## 2023-03-06 DIAGNOSIS — I130 Hypertensive heart and chronic kidney disease with heart failure and stage 1 through stage 4 chronic kidney disease, or unspecified chronic kidney disease: Secondary | ICD-10-CM | POA: Insufficient documentation

## 2023-03-06 DIAGNOSIS — I509 Heart failure, unspecified: Secondary | ICD-10-CM | POA: Insufficient documentation

## 2023-03-06 DIAGNOSIS — N183 Chronic kidney disease, stage 3 unspecified: Secondary | ICD-10-CM | POA: Insufficient documentation

## 2023-03-06 DIAGNOSIS — S299XXA Unspecified injury of thorax, initial encounter: Secondary | ICD-10-CM | POA: Diagnosis present

## 2023-03-06 DIAGNOSIS — I700 Atherosclerosis of aorta: Secondary | ICD-10-CM | POA: Diagnosis not present

## 2023-03-06 DIAGNOSIS — K449 Diaphragmatic hernia without obstruction or gangrene: Secondary | ICD-10-CM | POA: Diagnosis not present

## 2023-03-06 DIAGNOSIS — S2232XA Fracture of one rib, left side, initial encounter for closed fracture: Secondary | ICD-10-CM | POA: Diagnosis not present

## 2023-03-06 MED ORDER — OXYCODONE-ACETAMINOPHEN 5-325 MG PO TABS
1.0000 | ORAL_TABLET | Freq: Once | ORAL | Status: AC
  Administered 2023-03-06: 1 via ORAL
  Filled 2023-03-06: qty 1

## 2023-03-06 MED ORDER — OXYCODONE HCL 5 MG PO TABS: 5.0000 mg | ORAL_TABLET | Freq: Four times a day (QID) | ORAL | 0 refills | Status: AC | PRN

## 2023-03-06 NOTE — Discharge Instructions (Addendum)
We evaluated you for your fall.  Your CT scan shows a broken rib.  Your x-rays did not show any broken bones in your arm.  Please use your home incentive spirometer 4-6 times a day to make sure you are taking deep breaths.  This can help prevent a pneumonia.  Please take at 1000 mg of Tylenol every 6 hours as needed for pain.  I have also prescribed you some oxycodone which you can take for pain unrelieved by Tylenol.  Please try to limit this is much as possible.  They can make you drowsy and at high risk of falls.  Do not mix this with your temazepam as this can make you even more drowsy. I would also really recommend to limit temazepam is much as possible as this can make you drowsy and at a higher risk of falls.  If you have any new episodes such as fainting spells, prolonged loss of consciousness, chest pain, palpitations, difficulty breathing, headaches, nausea or vomiting, or any other new symptoms, please return to the emergency department for reassessment.  Please be sure to follow-up closely with your primary doctor.

## 2023-03-06 NOTE — ED Triage Notes (Addendum)
Pt states got up to get a drink at 0130, fell in the kitchen, states she "fell asleep" states has hx of falling asleep.  pain to left flank and right shoulder. Denies head injury or blood thinners.

## 2023-03-06 NOTE — ED Provider Notes (Signed)
[place] [facility] AT [facility] Provider Note  CSN: [id number] Arrival date & time: 03/06/23 4332  Chief Complaint(s) Fall and Flank Pain  HPI [name] is a 82 y.o. female history of CHF, prior PE not on anticoagulation, hypertension presenting to the emergency department with fall.  Patient reports that she did not sleep very well at all last night, got up to go to the kitchen to get a drink.  She took temazepam to try to help her sleep.  She reports that she was feeling very very tired and sleepy.  She reports that she knew she needed to go to bed and go back to sleep but was leaning against the countertop.  She then reports she fell asleep for a moment and then hit her left back and right arm.  She woke up immediately in the process of falling and tried to catch herself.  Denies any head injury, abdominal pain.  No pain in her legs or left arm.  No neck pain.  She reports that she has a history of falling asleep frequently and is discussed this with the doctor.  No chest pain, palpitations.  Patient reports that as soon as she fell she was awake again.  No difficulty breathing.  Past Medical History Past Medical History:  Diagnosis Date   Anemia    Arthritis    osteoarthritis Shoulder,neck   Bell's palsy 1980s   CHF (congestive heart failure) (HCC) 06/2019   Chronic kidney disease    Stage III   Dyspnea 06/2019   On exertion   GERD (gastroesophageal reflux disease)    Hemochromatosis    History of hiatal hernia    Hypertension    Iron deficiency anemia due to chronic blood loss 03/10/2017   PE (pulmonary embolism) 2007   x2   Peripheral vascular disease (HCC)    DVT Rt leg   Pneumonia 1990s   "walking" pneumonia   Prothrombin gene mutation (HCC) 05/30/2013   Restless legs    Right leg DVT (HCC) 05/30/2013   Stroke (HCC)    found on a MRI, she's not aware otherwise   Patient Active Problem List   Diagnosis Date Noted   Coronary artery disease  01/01/2022   S/P TKR (total knee replacement) using cement 03/16/2020   Hyperlipidemia 01/31/2020   AKI (acute kidney injury) (HCC) 09/15/2019   Right flank pain 09/15/2019   Anemia 09/15/2019   Abnormal nuclear cardiac imaging test    CHF (congestive heart failure) (HCC) 07/09/2019   Dyspnea 07/09/2019   Respiratory failure with hypoxia (HCC) 07/09/2019   Shoulder pain 07/09/2019   Dyspnea on exertion 06/03/2019   Bilateral lower extremity edema 06/03/2019   Left bundle branch block 06/03/2019   Iron deficiency anemia due to chronic blood loss 03/10/2017   Postoperative wound dehiscence 12/02/2016   E. coli UTI    Abnormal urinalysis    Medication side effect    Neuropathic pain    History of DVT (deep vein thrombosis)    HTN (hypertension)    RLS (restless legs syndrome)    Urinary retention    Hypokalemia    Hypoalbuminemia due to protein-calorie malnutrition (HCC)    Acute blood loss anemia    Lumbar stenosis with neurogenic claudication 11/18/2016   Spondylolisthesis of lumbar region 11/12/2016   Hereditary hemochromatosis (HCC) 07/28/2016   Prothrombin gene mutation (HCC) 05/30/2013   Right leg DVT (HCC) 05/30/2013   Hemochromatosis 07/29/2012   Home Medication(s) Prior to Admission  medications   Medication Sig Start Date End Date Taking? Authorizing Provider  oxyCODONE (ROXICODONE) 5 MG immediate release tablet Take 1 tablet (5 mg total) by mouth every 6 (six) hours as needed for severe pain (pain score 7-10). 03/06/23  Yes Lonell Grandchild, MD  acetaminophen (TYLENOL) 500 MG tablet Take 1,000 mg by mouth every 6 (six) hours as needed. 01/08/23   [provider]  allopurinol (ZYLOPRIM) 300 MG tablet Take 300 mg by mouth in the morning. 11/12/20   [provider]  aspirin EC 81 MG tablet Take 162 mg by mouth in the morning. Swallow whole.    [provider]  atorvastatin (LIPITOR) 40 MG tablet Take 1 tablet (40 mg total) by mouth daily at 6  PM. 07/13/19   Meredeth Ide, MD  Biotin 1000 MCG tablet Take 1,000 mcg by mouth in the morning.    [provider]  Calcium Carb-Cholecalciferol (CALCIUM 600+D3 PO) Take 1 tablet by mouth in the morning and at bedtime.    [provider]  cholecalciferol (VITAMIN D) 25 MCG (1000 UNIT) tablet Take 1,000 Units by mouth in the morning and at bedtime.    [provider]  clindamycin (CLEOCIN) 150 MG capsule Take 4 capsules by mouth. 4 tablets prior to dental procedures Patient not taking: Reported on 01/13/2023    [provider]  colchicine 0.6 MG tablet Take 0.6 mg by mouth daily. PRN    [provider]  cycloSPORINE (RESTASIS) 0.05 % ophthalmic emulsion Place 1 drop into both eyes 2 (two) times daily.    [provider]  docusate sodium (COLACE) 100 MG capsule Take 1 capsule (100 mg total) by mouth every 12 (twelve) hours. 09/05/22   Arby Barrette, MD  famotidine (PEPCID) 40 MG tablet Take 40 mg by mouth daily. Patient not taking: Reported on 01/13/2023 10/09/22   [provider]  ferrous sulfate 325 (65 FE) MG EC tablet Take 325 mg by mouth in the morning.    [provider]  furosemide (LASIX) 80 MG tablet Take 1 tablet (80 mg total) by mouth daily. 10/23/22   Azalee Course, PA  Hypromellose (ARTIFICIAL TEARS OP) Place 1 drop into both eyes 3 (three) times daily as needed (dry eyes).    [provider]  losartan (COZAAR) 50 MG tablet Take 1 tablet (50 mg total) by mouth daily. 07/13/19   Meredeth Ide, MD  metoprolol succinate (TOPROL-XL) 25 MG 24 hr tablet Take 1 tablet by mouth once daily 12/01/22   Runell Gess, MD  Multiple Vitamin (MULTIVITAMIN WITH MINERALS) TABS tablet Take 1 tablet by mouth in the morning. Centrum Silver (NO IRON)    [provider]  Omega-3 Fatty Acids (FISH OIL PO) Take 1 capsule by mouth in the morning.    [provider]  omeprazole (PRILOSEC) 20 MG capsule Take 20 mg by mouth  daily. In PM and 40 mg in AM 07/04/19   [provider]  omeprazole (PRILOSEC) 40 MG capsule Take 40 mg by mouth daily. In AM and 20 mg in PM 01/05/23   [provider]  ondansetron (ZOFRAN) 4 MG tablet Take by mouth. 02/21/21   [provider]  polycarbophil (FIBERCON) 625 MG tablet Take 625 mg by mouth in the morning and at bedtime.    [provider]  potassium chloride SA (KLOR-CON M) 20 MEQ tablet Take with furosemide on Friday and Saturday for two doses. 08/29/21   Ronney Asters,  NP  pramipexole (MIRAPEX) 0.5 MG tablet Take 1 mg by mouth daily.    [provider]  temazepam (RESTORIL) 15 MG capsule Take 1 capsule (15 mg total) by mouth at bedtime. 12/02/16   Love, Evlyn Kanner, PA-C  Turmeric Curcumin 500 MG CAPS Take 500 mg by mouth at bedtime.     [provider]  vitamin E 180 MG (400 UNITS) capsule Take 400 Units by mouth at bedtime.    [provider]                                                                                                                                    Past Surgical History Past Surgical History:  Procedure Laterality Date   BACK SURGERY     CARDIAC CATHETERIZATION Bilateral 2005   COLONOSCOPY     IVC FILTER INSERTION N/A 03/12/2020   Procedure: IVC FILTER INSERTION;  Surgeon: Maeola Harman, MD;  [facility] INVASIVE CV LAB;  Service: Cardiovascular;  Laterality: N/A;   IVC FILTER REMOVAL N/A 06/11/2020   Procedure: IVC FILTER REMOVAL;  Surgeon: Maeola Harman, MD;  Location: [facility] INVASIVE CV LAB;  Service: Cardiovascular;  Laterality: N/A;   REVERSE SHOULDER ARTHROPLASTY Right 03/07/2021   Procedure: REVERSE SHOULDER ARTHROPLASTY;  Surgeon: Francena Hanly, MD;  Location: [facility] ORS;  Service: Orthopedics;  Laterality: Right;    RIGHT/LEFT HEART CATH AND CORONARY ANGIOGRAPHY N/A 07/12/2019   Procedure: RIGHT/LEFT HEART CATH AND CORONARY ANGIOGRAPHY;  Surgeon: Lennette Bihari, MD;   Location: [facility] INVASIVE CV LAB;  Service: Cardiovascular;  Laterality: N/A;   SHOULDER SURGERY Bilateral    TOTAL KNEE ARTHROPLASTY Right 03/16/2020   Procedure: TOTAL KNEE ARTHROPLASTY;  Surgeon: Jene Every, MD;  Location: [facility] ORS;  Service: Orthopedics;  Laterality: Right;  2.5 hrs   Family History Family History  Problem Relation Age of Onset   Congestive Heart Failure Mother    Pancreatic cancer Father     Social History Social History   Tobacco Use   Smoking status: Never   Smokeless tobacco: Never   Tobacco comments:    never used tobacco  Vaping Use   Vaping status: Never Used  Substance Use Topics   Alcohol use: No    Alcohol/week: 0.0 standard drinks of alcohol   Drug use: No   Allergies Morphine and codeine; Penicillins; Baclofen; Celecoxib; Doxycycline; Levofloxacin; Penicillin g benzathine; Pneumococcal vac polyvalent; Sulfa antibiotics; Sulfamethoxazole; and Tetanus toxoid, adsorbed  Review of Systems Review of Systems  All other systems reviewed and are negative.   Physical Exam Vital Signs  I have reviewed the triage vital signs BP 124/61 (BP Location: Right Arm)   Pulse 66   Temp 98 F (36.7 C) (Oral)   Resp 20   Ht 5' 2.5" (1.588 m)   Wt 81.6 kg   SpO2 98%   BMI 32.40 kg/m  Physical Exam Vitals and nursing note reviewed.  Constitutional:      General: She is not in acute distress.    Appearance: She is well-developed.  HENT:     Head: Normocephalic and atraumatic.     Mouth/Throat:     Mouth: Mucous membranes are moist.  Eyes:     Pupils: Pupils are equal, round, and reactive to light.  Cardiovascular:     Rate and Rhythm: Normal rate and regular rhythm.     Heart sounds: No murmur heard. Pulmonary:     Effort: Pulmonary effort is normal. No respiratory distress.     Breath sounds: Normal breath sounds.  Abdominal:     General: Abdomen is flat.     Palpations: Abdomen is soft.     Tenderness: There is no abdominal tenderness.   Musculoskeletal:        General: No tenderness.     Right lower leg: No edema.     Left lower leg: No edema.     Comments: No midline C, T, L-spine tenderness.  Abrasion and bruising to the left posterior lateral chest wall.  No crepitus.  Painful range of motion of the right shoulder with bruising over the right humerus.  Left upper extremity atraumatic and remainder of right upper extremity atraumatic with normal range of motion and no tenderness.  Bilateral lower extremities with full intact active range of motion with no focal tenderness or deformity.  Skin:    General: Skin is warm and dry.  Neurological:     General: No focal deficit present.     Mental Status: She is alert. Mental status is at baseline.  Psychiatric:        Mood and Affect: Mood normal.        Behavior: Behavior normal.     ED Results and Treatments Labs (all labs ordered are listed, but only abnormal results are displayed) Labs Reviewed - No data to display                                                                                                                        Radiology CT Chest Wo Contrast  Result Date: 03/06/2023 CLINICAL DATA:  Chest trauma, blunt. Fall from a chair. Left upper back pain. EXAM: CT CHEST WITHOUT CONTRAST TECHNIQUE: Multidetector CT imaging of the chest was performed following the standard protocol without IV contrast. RADIATION DOSE REDUCTION: This exam was performed according to the departmental dose-optimization program which includes automated exposure control, adjustment of the mA and/or kV according to patient size and/or use of iterative reconstruction technique. COMPARISON:  Left rib radiographs 09/05/2022.  CTA chest 07/09/2019. FINDINGS: Cardiovascular: Thoracic aortic atherosclerosis without aneurysm. LAD and right coronary artery atherosclerotic calcification. Upper limits of normal heart size. No pericardial effusion. Mediastinum/Nodes: No enlarged axillary, mediastinal,  or hilar lymph nodes are identified, with hilar assessment limited in the absence of IV contrast. Moderate to large sliding hiatal hernia. Unremarkable included thyroid. Lungs/Pleura: No pleural effusion or pneumothorax. Mild biapical lung scarring. Minimal atelectasis  or scarring in the lung bases. Upper Abdomen: No acute abnormality. Musculoskeletal: Reverse right total shoulder arthroplasty. At most minimally displaced posterior left seventh, eighth, and ninth rib fractures which have a nonacute appearance with evidence of callus formation (and with the eighth rib fracture being visible on the 09/05/2022 radiographs). A nondisplaced posterior left eighth rib fracture located more medially may be acute (series 302, image 63). Partially visualized stranding in the overlying superficial soft tissues of the posterolateral left chest wall compatible with a contusion. Disc and facet degeneration throughout the thoracic spine. IMPRESSION: 1. Likely acute, nondisplaced posterior left eighth rib fracture. 2. Additional nonacute fractures of the posterior left seventh through ninth ribs. 3. No pneumothorax or pleural effusion. 4. Moderate to large hiatal hernia. 5.  Aortic Atherosclerosis (ICD10-I70.0). Electronically Signed   By: Sebastian Ache M.D.   On: 03/06/2023 09:11   DG Shoulder Right  Result Date: 03/06/2023 CLINICAL DATA:  Fall from chair while sleeping.  Right arm pain. EXAM: RIGHT SHOULDER - 2 VIEW; RIGHT HUMERUS - 2 VIEW COMPARISON:  None Available. FINDINGS: Right reverse total shoulder arthroplasty. Hardware appears well seated and intact. No abnormal surrounding lucency. There is no evidence of fracture or dislocation. Degenerative changes of the acromioclavicular joint. Soft tissues are unremarkable. IMPRESSION: 1. No acute fracture or dislocation. 2. Right reverse total shoulder arthroplasty without evidence of hardware complication. Electronically Signed   By: Agustin Cree M.D.   On: 03/06/2023 08:41    DG Humerus Right  Result Date: 03/06/2023 CLINICAL DATA:  Fall from chair while sleeping.  Right arm pain. EXAM: RIGHT SHOULDER - 2 VIEW; RIGHT HUMERUS - 2 VIEW COMPARISON:  None Available. FINDINGS: Right reverse total shoulder arthroplasty. Hardware appears well seated and intact. No abnormal surrounding lucency. There is no evidence of fracture or dislocation. Degenerative changes of the acromioclavicular joint. Soft tissues are unremarkable. IMPRESSION: 1. No acute fracture or dislocation. 2. Right reverse total shoulder arthroplasty without evidence of hardware complication. Electronically Signed   By: Agustin Cree M.D.   On: 03/06/2023 08:41    Pertinent labs & imaging results that were available during my care of the patient were reviewed by me and considered in my medical decision making (see MDM for details).  Medications Ordered in ED Medications  oxyCODONE-acetaminophen (PERCOCET/ROXICET) 5-325 MG per tablet 1 tablet (1 tablet Oral Given 03/06/23 0752)                                                                                                                                     Procedures Procedures  (including critical care time)  Medical Decision Making / ED Course   MDM:  82 year old female presenting with fall.  Patient well-appearing, physical exam with bruising to the left posterior lateral chest wall and right humerus with pain in the right humerus and shoulder.  Will obtain x-rays to evaluate for fracture.  Given large area of bruising  will obtain CT chest to evaluate for rib fractures.  She reports that this occurred after falling asleep.  She reports that she has a long history of excessive sleepiness and falling asleep and she has seen her doctor for this prior.  She reports she has not been sleeping very well at all.  Seems less likely to be any kind of syncopal event and she reports prior to this she was very sleepy and sleeping with her head on the countertop.   Denies any syncopal prodrome or any significant loss of consciousness was awake as soon as she fell.  Will give pain medication.  Will reassess.  Clinical Course as of 03/06/23 1610  [facility] Mar 06, 2023  9604 Patient is feeling better.  CT chest with single rib fracture and a few old rib fractures.  X-ray of the right arm without evidence of fracture.  Will prescribe oxycodone.  Discussed safe use of this medication with the patient including avoiding driving.  She does take temazepam and discussed that she should absolutely not combine these medications.  She does not take temazepam daily.  Also discussed that she should probably talk to her primary doctor about stopping the temazepam as this will increase her risk of falls as well.  This could be contributing to her sleepiness and falls. Will discharge patient to home. All questions answered. Patient comfortable with plan of discharge. Return precautions discussed with patient and specified on the after visit summary.  [WS]    Clinical Course User Index [WS] Lonell Grandchild, MD     Additional history obtained: -Additional history obtained from family -External records from outside source obtained and reviewed including: Chart review including previous notes, labs, imaging, consultation notes including prior PMD notes    EKG   EKG Interpretation Date/Time:  Friday March 06 2023 07:55:54 EST Ventricular Rate:  64 PR Interval:  179 QRS Duration:  161 QT Interval:  470 QTC Calculation: 485 R Axis:   124  Text Interpretation: Sinus rhythm Left bundle branch block Confirmed by Alvino Blood ([id number]) on 03/06/2023 8:46:01 AM         Imaging Studies ordered: I ordered imaging studies including XR right arm/shoulder, CT chest On my interpretation imaging demonstrates rib fracture  I independently visualized and interpreted imaging. I agree with the radiologist interpretation   Medicines ordered and prescription drug  management: Meds ordered this encounter  Medications   oxyCODONE-acetaminophen (PERCOCET/ROXICET) 5-325 MG per tablet 1 tablet   oxyCODONE (ROXICODONE) 5 MG immediate release tablet    Sig: Take 1 tablet (5 mg total) by mouth every 6 (six) hours as needed for severe pain (pain score 7-10).    Dispense:  12 tablet    Refill:  0    -I have reviewed the patients home medicines and have made adjustments as needed   Social Determinants of Health:  Diagnosis or treatment significantly limited by social determinants of health: obesity   Reevaluation: After the interventions noted above, I reevaluated the patient and found that their symptoms have improved  Co morbidities that complicate the patient evaluation  Past Medical History:  Diagnosis Date   Anemia    Arthritis    osteoarthritis Shoulder,neck   Bell's palsy 1980s   CHF (congestive heart failure) (HCC) 06/2019   Chronic kidney disease    Stage III   Dyspnea 06/2019   On exertion   GERD (gastroesophageal reflux disease)    Hemochromatosis    History of hiatal hernia  Hypertension    Iron deficiency anemia due to chronic blood loss 03/10/2017   PE (pulmonary embolism) 2007   x2   Peripheral vascular disease (HCC)    DVT Rt leg   Pneumonia 1990s   "walking" pneumonia   Prothrombin gene mutation (HCC) 05/30/2013   Restless legs    Right leg DVT (HCC) 05/30/2013   Stroke (HCC)    found on a MRI, she's not aware otherwise      Dispostion: Disposition decision including need for hospitalization was considered, and patient discharged from emergency department.    Final Clinical Impression(s) / ED Diagnoses Final diagnoses:  Closed fracture of one rib of left side, initial encounter     This chart was dictated using voice recognition software.  Despite best efforts to proofread,  errors can occur which can change the documentation meaning.    Lonell Grandchild, MD 03/06/23 [phone]

## 2023-03-20 DIAGNOSIS — I82501 Chronic embolism and thrombosis of unspecified deep veins of right lower extremity: Secondary | ICD-10-CM | POA: Diagnosis not present

## 2023-03-20 DIAGNOSIS — D6852 Prothrombin gene mutation: Secondary | ICD-10-CM | POA: Diagnosis not present

## 2023-03-20 DIAGNOSIS — G259 Extrapyramidal and movement disorder, unspecified: Secondary | ICD-10-CM | POA: Diagnosis not present

## 2023-03-20 DIAGNOSIS — Z23 Encounter for immunization: Secondary | ICD-10-CM | POA: Diagnosis not present

## 2023-03-20 DIAGNOSIS — S2232XD Fracture of one rib, left side, subsequent encounter for fracture with routine healing: Secondary | ICD-10-CM | POA: Diagnosis not present

## 2023-03-20 DIAGNOSIS — G4700 Insomnia, unspecified: Secondary | ICD-10-CM | POA: Diagnosis not present

## 2023-03-20 DIAGNOSIS — I10 Essential (primary) hypertension: Secondary | ICD-10-CM | POA: Diagnosis not present

## 2023-04-07 DIAGNOSIS — R300 Dysuria: Secondary | ICD-10-CM | POA: Diagnosis not present

## 2023-04-07 DIAGNOSIS — I428 Other cardiomyopathies: Secondary | ICD-10-CM | POA: Diagnosis not present

## 2023-04-07 DIAGNOSIS — G2581 Restless legs syndrome: Secondary | ICD-10-CM | POA: Diagnosis not present

## 2023-04-07 DIAGNOSIS — N183 Chronic kidney disease, stage 3 unspecified: Secondary | ICD-10-CM | POA: Diagnosis not present

## 2023-04-07 DIAGNOSIS — Z9989 Dependence on other enabling machines and devices: Secondary | ICD-10-CM | POA: Diagnosis not present

## 2023-04-08 ENCOUNTER — Ambulatory Visit: Payer: PPO | Attending: Cardiovascular Disease | Admitting: Cardiovascular Disease

## 2023-04-08 ENCOUNTER — Encounter: Payer: Self-pay | Admitting: Cardiovascular Disease

## 2023-04-08 VITALS — BP 110/70 | HR 73 | Ht 63.0 in | Wt 181.0 lb

## 2023-04-08 DIAGNOSIS — E782 Mixed hyperlipidemia: Secondary | ICD-10-CM | POA: Diagnosis not present

## 2023-04-08 DIAGNOSIS — I2510 Atherosclerotic heart disease of native coronary artery without angina pectoris: Secondary | ICD-10-CM | POA: Diagnosis not present

## 2023-04-08 DIAGNOSIS — I447 Left bundle-branch block, unspecified: Secondary | ICD-10-CM

## 2023-04-08 DIAGNOSIS — I5042 Chronic combined systolic (congestive) and diastolic (congestive) heart failure: Secondary | ICD-10-CM | POA: Diagnosis not present

## 2023-04-08 DIAGNOSIS — R600 Localized edema: Secondary | ICD-10-CM

## 2023-04-08 DIAGNOSIS — I10 Essential (primary) hypertension: Secondary | ICD-10-CM

## 2023-04-08 NOTE — Assessment & Plan Note (Signed)
Chronic systolic and diastolic heart failure with 2D echo performed 10/10/2022 revealing an EF of 40 to 45% which is similar to prior echoes with grade 1 diastolic dysfunction and no significant valvular abnormalities.  She is on furosemide 80 mg a day for lower extreme edema.  She complains of mild stable dyspnea.

## 2023-04-08 NOTE — Assessment & Plan Note (Signed)
History of CAD status post cardiac catheterization performed by Dr. Tresa Endo 07/09/2019 revealing 40% proximal LAD lesion and otherwise nonobstructive CAD.  She denies chest pain.

## 2023-04-08 NOTE — Progress Notes (Signed)
04/08/2023 DAZIA HERMOSO   December 06, 1940  [id number]  Primary Physician Noberto Retort, MD Primary Cardiologist: Runell Gess MD Nicholes Calamity, [place]  HPI:  [name] is a 82 y.o.   moderately overweight married Caucasian female mother of 2 children, grandmother of 6 grandchildren referred by Dr. Tiburcio Pea for evaluation of dyspnea on exertion.  She is retired from being in accounts payable in data entry.  I last saw her in the office 01/01/2022. Her risk factors include treated hypertension and mild untreated hyperlipidemia.  One of her sisters did have CABG.  There is a question that she has had a remote stroke.  She is never had a heart attack.  She denies chest pain.  She does have GERD.  She has had right lower extremity DVT 10 years ago complicated by pulmonary embolism on Xarelto.  She says over the last several years she said progressive dyspnea on exertion.   She was admitted to the hospital 07/09/2019 for 4 days and heart failure.  She underwent right and left heart cath by Dr. Tresa Endo revealing almost 40% proximal LAD lesion with elevated right atrial pressure, pulmonary artery pressure, wedge pressure and LVEDP.  She was diuresed and currently weighs 172 pounds down from 188 pounds on 06/03/2019.  She is aware of salt restriction.  She is on furosemide 80 mg a day and currently denies shortness of breath.   She does have a history of thrombophilia with prothrombin gene mutation.  She is had DVT in the past as well as pulmonary emboli.  She has been on Xarelto several times in the past most recently 2 years ago.  She had successful right total knee replacement by Dr. Jillyn Hidden.     Since I saw her in the office 1 year ago she continues to do well.  She walks with a walker.  She has chronic swelling of her left lower extremity greater than her right on Lasix 80 mg a day.  She is sensitive to salt intake.  She did have a 2D echo performed 10/10/2022 that showed an EF of 40 to 45%,  consistent with prior echoes, with grade 1 diastolic dysfunction.  She has chronic left branch block.  She has chronic mild dyspnea which has not changed but denies chest pain.   Current Meds  Medication Sig   acetaminophen (TYLENOL) 500 MG tablet Take 1,000 mg by mouth every 6 (six) hours as needed.   allopurinol (ZYLOPRIM) 300 MG tablet Take 300 mg by mouth in the morning.   aspirin EC 81 MG tablet Take 162 mg by mouth in the morning. Swallow whole.   atorvastatin (LIPITOR) 40 MG tablet Take 1 tablet (40 mg total) by mouth daily at 6 PM.   Biotin 1000 MCG tablet Take 1,000 mcg by mouth in the morning.   Calcium Carb-Cholecalciferol (CALCIUM 600+D3 PO) Take 1 tablet by mouth in the morning and at bedtime.   cholecalciferol (VITAMIN D) 25 MCG (1000 UNIT) tablet Take 1,000 Units by mouth in the morning and at bedtime.   colchicine 0.6 MG tablet Take 0.6 mg by mouth daily. PRN   cycloSPORINE (RESTASIS) 0.05 % ophthalmic emulsion Place 1 drop into both eyes 2 (two) times daily.   ferrous sulfate 325 (65 FE) MG EC tablet Take 325 mg by mouth in the morning.   furosemide (LASIX) 80 MG tablet Take 1 tablet (80 mg total) by mouth daily.   Hypromellose (ARTIFICIAL TEARS OP) Place 1  drop into both eyes 3 (three) times daily as needed (dry eyes).   losartan (COZAAR) 50 MG tablet Take 1 tablet (50 mg total) by mouth daily.   metoprolol succinate (TOPROL-XL) 25 MG 24 hr tablet Take 1 tablet by mouth once daily   Multiple Vitamin (MULTIVITAMIN WITH MINERALS) TABS tablet Take 1 tablet by mouth in the morning. Centrum Silver (NO IRON)   Omega-3 Fatty Acids (FISH OIL PO) Take 1 capsule by mouth in the morning.   omeprazole (PRILOSEC) 20 MG capsule Take 20 mg by mouth daily. In PM and 40 mg in AM   omeprazole (PRILOSEC) 40 MG capsule Take 40 mg by mouth daily. In AM and 20 mg in PM   oxyCODONE (ROXICODONE) 5 MG immediate release tablet Take 1 tablet (5 mg total) by mouth every 6 (six) hours as needed for  severe pain (pain score 7-10).   polycarbophil (FIBERCON) 625 MG tablet Take 625 mg by mouth in the morning and at bedtime.   pramipexole (MIRAPEX) 0.5 MG tablet Take 1 mg by mouth daily.   temazepam (RESTORIL) 15 MG capsule Take 1 capsule (15 mg total) by mouth at bedtime.   Turmeric Curcumin 500 MG CAPS Take 500 mg by mouth at bedtime.    vitamin E 180 MG (400 UNITS) capsule Take 400 Units by mouth at bedtime.     Allergies  Allergen Reactions   Morphine And Codeine Other (See Comments)    LARGER DOSES OF MORPHINE CAUSES BODY TWITCHING   Penicillins Anaphylaxis    Childhood reaction Has patient had a PCN reaction causing immediate rash, facial/tongue/throat swelling, SOB or lightheadedness with hypotension: Yes Has patient had a PCN reaction causing severe rash involving mucus membranes or skin necrosis: Unknown Has patient had a PCN reaction that required hospitalization: No Has patient had a PCN reaction occurring within the last 10 years: no (5 or 82 yrs old) If all of the above answers are "NO", then may proceed with Cephalosporin use.    Baclofen Other (See Comments)    Other reaction(s): Unknown   Celecoxib Other (See Comments)    Other reaction(s): Unknown   Doxycycline Other (See Comments)   Levofloxacin Other (See Comments)    Other reaction(s): abnl taste   Penicillin G Benzathine Other (See Comments)   Pneumococcal Vac Polyvalent Other (See Comments)    Other reaction(s): local reaction   Sulfa Antibiotics Other (See Comments)    Unknown childhood reaction   Sulfamethoxazole Other (See Comments)   Tetanus Toxoid, Adsorbed Other (See Comments)    Other reaction(s): local reaction    Social History   Socioeconomic History   Marital status: Married    Spouse name: Not on file   Number of children: Not on file   Years of education: Not on file   Highest education level: Not on file  Occupational History   Not on file  Tobacco Use   Smoking status: Never    Smokeless tobacco: Never   Tobacco comments:    never used tobacco  Vaping Use   Vaping status: Never Used  Substance and Sexual Activity   Alcohol use: No    Alcohol/week: 0.0 standard drinks of alcohol   Drug use: No   Sexual activity: Not Currently  Other Topics Concern   Not on file  Social History Narrative   Not on file   Social Determinants of Health   Financial Resource Strain: Not on file  Food Insecurity: Low Risk  (12/09/2022)  Received from [facility]   Hunger Vital Sign    Worried About Running Out of Food in the Last Year: Never true    Ran Out of Food in the Last Year: Never true  Transportation Needs: Not on file (12/09/2022)  Physical Activity: Not on file  Stress: Not on file  Social Connections: Unknown (06/25/2022)   Received from [facility], [facility]   Social Network    Social Network: Not on file  Intimate Partner Violence: Unknown (06/25/2022)   Received from [facility], [facility]   HITS    Physically Hurt: Not on file    Insult or Talk Down To: Not on file    Threaten Physical Harm: Not on file    Scream or Curse: Not on file     Review of Systems: General: negative for chills, fever, night sweats or weight changes.  Cardiovascular: negative for chest pain, dyspnea on exertion, edema, orthopnea, palpitations, paroxysmal nocturnal dyspnea or shortness of breath Dermatological: negative for rash Respiratory: negative for cough or wheezing Urologic: negative for hematuria Abdominal: negative for nausea, vomiting, diarrhea, bright red blood per rectum, melena, or hematemesis Neurologic: negative for visual changes, syncope, or dizziness All other systems reviewed and are otherwise negative except as noted above.    Blood pressure 110/70, pulse 73, height 5\' 3"  (1.6 m), weight 181 lb (82.1 kg), SpO2 98%.  General appearance: alert and no distress Neck: no adenopathy, no carotid bruit, no JVD, supple, symmetrical, trachea midline,  and thyroid not enlarged, symmetric, no tenderness/mass/nodules Lungs: clear to auscultation bilaterally Heart: regular rate and rhythm, S1, S2 normal, no murmur, click, rub or gallop Extremities: 1+ left lower extremity edema Pulses: 2+ and symmetric Skin: Skin color, texture, turgor normal. No rashes or lesions Neurologic: Grossly normal  EKG not performed today      ASSESSMENT AND PLAN:   HTN (hypertension) History of essential hypertension her blood pressure measured today at 110/70.  She is on losartan, and metoprolol.  Bilateral lower extremity edema Chronic bilateral lower extreme edema on furosemide 80 mg a day.  She knows to take an extra 40 mg if she has additional swelling.  She has minimal edema on exam today.  Left bundle branch block Chronic  CHF (congestive heart failure) (HCC) Chronic systolic and diastolic heart failure with 2D echo performed 10/10/2022 revealing an EF of 40 to 45% which is similar to prior echoes with grade 1 diastolic dysfunction and no significant valvular abnormalities.  She is on furosemide 80 mg a day for lower extreme edema.  She complains of mild stable dyspnea.  Hyperlipidemia History of hyperlipidemia on statin therapy with lipid profile performed 11/24/2022 revealing total cholesterol 132, LDL 43 and HDL 75.  Coronary artery disease History of CAD status post cardiac catheterization performed by Dr. Tresa Endo 07/09/2019 revealing 40% proximal LAD lesion and otherwise nonobstructive CAD.  She denies chest pain.     Runell Gess MD FACP,FACC,FAHA, [facility] 04/08/2023 11:11 AM

## 2023-04-08 NOTE — Patient Instructions (Signed)
Medication Instructions:  Your physician recommends that you continue on your current medications as directed. Please refer to the Current Medication list given to you today.  *If you need a refill on your cardiac medications before your next appointment, please call your pharmacy*   Follow-Up: At [facility], you and your health needs are our priority.  As part of our continuing mission to provide you with exceptional heart care, we have created designated Provider Care Teams.  These Care Teams include your primary Cardiologist (physician) and Advanced Practice Providers (APPs -  Physician Assistants and Nurse Practitioners) who all work together to provide you with the care you need, when you need it.  We recommend signing up for the patient portal called "MyChart".  Sign up information is provided on this After Visit Summary.  MyChart is used to connect with patients for Virtual Visits (Telemedicine).  Patients are able to view lab/test results, encounter notes, upcoming appointments, etc.  Non-urgent messages can be sent to your provider as well.   To learn more about what you can do with MyChart, go to [email or web address].    Your next appointment:   6 month(s)  Provider:   Hao Meng, PA-C      Then, Jonathan Berry, MD will plan to see you again in 12 month(s).   

## 2023-04-08 NOTE — Assessment & Plan Note (Signed)
History of essential hypertension her blood pressure measured today at 110/70.  She is on losartan, and metoprolol.

## 2023-04-08 NOTE — Assessment & Plan Note (Signed)
Chronic bilateral lower extreme edema on furosemide 80 mg a day.  She knows to take an extra 40 mg if she has additional swelling.  She has minimal edema on exam today.

## 2023-04-08 NOTE — Assessment & Plan Note (Signed)
Chronic. 

## 2023-04-08 NOTE — Assessment & Plan Note (Signed)
History of hyperlipidemia on statin therapy with lipid profile performed 11/24/2022 revealing total cholesterol 132, LDL 43 and HDL 75.

## 2023-04-15 DIAGNOSIS — G250 Essential tremor: Secondary | ICD-10-CM | POA: Diagnosis not present

## 2023-04-15 DIAGNOSIS — G4700 Insomnia, unspecified: Secondary | ICD-10-CM | POA: Diagnosis not present

## 2023-04-15 DIAGNOSIS — E7800 Pure hypercholesterolemia, unspecified: Secondary | ICD-10-CM | POA: Diagnosis not present

## 2023-04-15 DIAGNOSIS — N183 Chronic kidney disease, stage 3 unspecified: Secondary | ICD-10-CM | POA: Diagnosis not present

## 2023-04-15 DIAGNOSIS — G259 Extrapyramidal and movement disorder, unspecified: Secondary | ICD-10-CM | POA: Diagnosis not present

## 2023-04-15 DIAGNOSIS — K219 Gastro-esophageal reflux disease without esophagitis: Secondary | ICD-10-CM | POA: Diagnosis not present

## 2023-04-15 DIAGNOSIS — I5043 Acute on chronic combined systolic (congestive) and diastolic (congestive) heart failure: Secondary | ICD-10-CM | POA: Diagnosis not present

## 2023-04-15 DIAGNOSIS — I10 Essential (primary) hypertension: Secondary | ICD-10-CM | POA: Diagnosis not present

## 2023-04-15 DIAGNOSIS — Z8739 Personal history of other diseases of the musculoskeletal system and connective tissue: Secondary | ICD-10-CM | POA: Diagnosis not present

## 2023-04-15 DIAGNOSIS — R400 Somnolence: Secondary | ICD-10-CM | POA: Diagnosis not present

## 2023-04-15 DIAGNOSIS — I130 Hypertensive heart and chronic kidney disease with heart failure and stage 1 through stage 4 chronic kidney disease, or unspecified chronic kidney disease: Secondary | ICD-10-CM | POA: Diagnosis not present

## 2023-04-16 ENCOUNTER — Telehealth: Payer: Self-pay | Admitting: Cardiovascular Disease

## 2023-04-16 NOTE — Telephone Encounter (Signed)
Spoke to patient she stated her PCP has referred her to a sleep Dr.to be checked for sleep apnea.Stated she has been falling asleep during the day and she fell asleep at a stop light.She wanted Dr.Berry to be aware.I will send message to Dr.Berry.

## 2023-04-16 NOTE — Telephone Encounter (Signed)
Patient called to state that her PCP is sending her for a sleep study.  She stated that she falls asleep a lot during the day, like when she is reading.  The other day she feel asleep at a stop light.  She stated that she sometimes has trouble sleeping at night, and she does get sleepy when driving.

## 2023-05-06 ENCOUNTER — Other Ambulatory Visit: Payer: Self-pay | Admitting: Physician Assistant

## 2023-05-06 DIAGNOSIS — S2239XD Fracture of one rib, unspecified side, subsequent encounter for fracture with routine healing: Secondary | ICD-10-CM

## 2023-05-09 ENCOUNTER — Other Ambulatory Visit: Payer: PPO

## 2023-05-26 ENCOUNTER — Other Ambulatory Visit: Payer: Self-pay | Admitting: Family Medicine

## 2023-05-26 DIAGNOSIS — R1032 Left lower quadrant pain: Secondary | ICD-10-CM

## 2023-05-27 ENCOUNTER — Other Ambulatory Visit: Payer: PPO

## 2023-06-03 ENCOUNTER — Ambulatory Visit
Admission: RE | Admit: 2023-06-03 | Discharge: 2023-06-03 | Disposition: A | Discharge status: Home / Self Care | Payer: PPO | Source: Ambulatory Visit | Attending: Family Medicine | Admitting: Family Medicine

## 2023-06-03 ENCOUNTER — Other Ambulatory Visit: Payer: Self-pay | Admitting: Family Medicine

## 2023-06-03 DIAGNOSIS — R1032 Left lower quadrant pain: Secondary | ICD-10-CM

## 2023-06-03 MED ORDER — IOPAMIDOL (ISOVUE-300) INJECTION 61%: 100.0000 mL | Freq: Once | INTRAVENOUS | Status: DC | PRN

## 2023-06-26 ENCOUNTER — Other Ambulatory Visit: Payer: Self-pay

## 2023-06-26 MED ORDER — FUROSEMIDE 80 MG PO TABS
80.0000 mg | ORAL_TABLET | Freq: Every day | ORAL | 3 refills | Status: DC
  Filled 2023-09-22: qty 90, 90d supply, fill #0
  Filled 2023-12-16: qty 90, 90d supply, fill #1

## 2023-07-03 DIAGNOSIS — R109 Unspecified abdominal pain: Secondary | ICD-10-CM | POA: Diagnosis not present

## 2023-07-03 DIAGNOSIS — R197 Diarrhea, unspecified: Secondary | ICD-10-CM | POA: Diagnosis not present

## 2023-07-08 DIAGNOSIS — N289 Disorder of kidney and ureter, unspecified: Secondary | ICD-10-CM | POA: Diagnosis not present

## 2023-07-12 DIAGNOSIS — R197 Diarrhea, unspecified: Secondary | ICD-10-CM | POA: Diagnosis not present

## 2023-07-13 ENCOUNTER — Inpatient Hospital Stay: Payer: PPO | Attending: Family | Admitting: Family

## 2023-07-13 ENCOUNTER — Inpatient Hospital Stay: Payer: PPO

## 2023-07-14 DIAGNOSIS — G4733 Obstructive sleep apnea (adult) (pediatric): Secondary | ICD-10-CM | POA: Diagnosis not present

## 2023-07-15 ENCOUNTER — Emergency Department (HOSPITAL_BASED_OUTPATIENT_CLINIC_OR_DEPARTMENT_OTHER)

## 2023-07-15 ENCOUNTER — Other Ambulatory Visit: Payer: Self-pay

## 2023-07-15 ENCOUNTER — Inpatient Hospital Stay (HOSPITAL_BASED_OUTPATIENT_CLINIC_OR_DEPARTMENT_OTHER)
Admission: EM | Admit: 2023-07-15 | Discharge: 2023-07-18 | DRG: 280 | Disposition: A | Discharge status: Home / Self Care | Attending: Cardiovascular Disease | Admitting: Cardiovascular Disease

## 2023-07-15 ENCOUNTER — Emergency Department (HOSPITAL_COMMUNITY)

## 2023-07-15 ENCOUNTER — Encounter (HOSPITAL_BASED_OUTPATIENT_CLINIC_OR_DEPARTMENT_OTHER): Payer: Self-pay | Admitting: Emergency Medicine

## 2023-07-15 DIAGNOSIS — Z79899 Other long term (current) drug therapy: Secondary | ICD-10-CM | POA: Diagnosis not present

## 2023-07-15 DIAGNOSIS — E785 Hyperlipidemia, unspecified: Secondary | ICD-10-CM | POA: Diagnosis present

## 2023-07-15 DIAGNOSIS — I5021 Acute systolic (congestive) heart failure: Secondary | ICD-10-CM | POA: Diagnosis not present

## 2023-07-15 DIAGNOSIS — I447 Left bundle-branch block, unspecified: Secondary | ICD-10-CM | POA: Diagnosis present

## 2023-07-15 DIAGNOSIS — S2242XA Multiple fractures of ribs, left side, initial encounter for closed fracture: Secondary | ICD-10-CM | POA: Diagnosis not present

## 2023-07-15 DIAGNOSIS — Z800 Family history of malignant neoplasm of digestive organs: Secondary | ICD-10-CM | POA: Diagnosis not present

## 2023-07-15 DIAGNOSIS — E1122 Type 2 diabetes mellitus with diabetic chronic kidney disease: Secondary | ICD-10-CM | POA: Diagnosis present

## 2023-07-15 DIAGNOSIS — I4891 Unspecified atrial fibrillation: Secondary | ICD-10-CM | POA: Diagnosis not present

## 2023-07-15 DIAGNOSIS — Z882 Allergy status to sulfonamides status: Secondary | ICD-10-CM

## 2023-07-15 DIAGNOSIS — G2581 Restless legs syndrome: Secondary | ICD-10-CM | POA: Diagnosis not present

## 2023-07-15 DIAGNOSIS — Z96651 Presence of right artificial knee joint: Secondary | ICD-10-CM | POA: Diagnosis present

## 2023-07-15 DIAGNOSIS — G8929 Other chronic pain: Secondary | ICD-10-CM | POA: Diagnosis present

## 2023-07-15 DIAGNOSIS — R7989 Other specified abnormal findings of blood chemistry: Principal | ICD-10-CM | POA: Diagnosis present

## 2023-07-15 DIAGNOSIS — I509 Heart failure, unspecified: Secondary | ICD-10-CM | POA: Diagnosis not present

## 2023-07-15 DIAGNOSIS — I21A1 Myocardial infarction type 2: Secondary | ICD-10-CM | POA: Diagnosis not present

## 2023-07-15 DIAGNOSIS — I2510 Atherosclerotic heart disease of native coronary artery without angina pectoris: Secondary | ICD-10-CM | POA: Diagnosis not present

## 2023-07-15 DIAGNOSIS — R0789 Other chest pain: Secondary | ICD-10-CM | POA: Diagnosis not present

## 2023-07-15 DIAGNOSIS — I130 Hypertensive heart and chronic kidney disease with heart failure and stage 1 through stage 4 chronic kidney disease, or unspecified chronic kidney disease: Secondary | ICD-10-CM | POA: Diagnosis not present

## 2023-07-15 DIAGNOSIS — N1832 Chronic kidney disease, stage 3b: Secondary | ICD-10-CM | POA: Diagnosis present

## 2023-07-15 DIAGNOSIS — E1151 Type 2 diabetes mellitus with diabetic peripheral angiopathy without gangrene: Secondary | ICD-10-CM | POA: Diagnosis not present

## 2023-07-15 DIAGNOSIS — Z7901 Long term (current) use of anticoagulants: Secondary | ICD-10-CM

## 2023-07-15 DIAGNOSIS — I5023 Acute on chronic systolic (congestive) heart failure: Secondary | ICD-10-CM

## 2023-07-15 DIAGNOSIS — R0602 Shortness of breath: Secondary | ICD-10-CM | POA: Diagnosis not present

## 2023-07-15 DIAGNOSIS — I272 Pulmonary hypertension, unspecified: Secondary | ICD-10-CM | POA: Diagnosis not present

## 2023-07-15 DIAGNOSIS — R079 Chest pain, unspecified: Secondary | ICD-10-CM | POA: Diagnosis not present

## 2023-07-15 DIAGNOSIS — Z888 Allergy status to other drugs, medicaments and biological substances status: Secondary | ICD-10-CM

## 2023-07-15 DIAGNOSIS — Z96611 Presence of right artificial shoulder joint: Secondary | ICD-10-CM | POA: Diagnosis present

## 2023-07-15 DIAGNOSIS — I493 Ventricular premature depolarization: Secondary | ICD-10-CM | POA: Diagnosis not present

## 2023-07-15 DIAGNOSIS — R000 Tachycardia, unspecified: Secondary | ICD-10-CM | POA: Diagnosis present

## 2023-07-15 DIAGNOSIS — D6852 Prothrombin gene mutation: Secondary | ICD-10-CM | POA: Diagnosis not present

## 2023-07-15 DIAGNOSIS — K449 Diaphragmatic hernia without obstruction or gangrene: Secondary | ICD-10-CM | POA: Diagnosis not present

## 2023-07-15 DIAGNOSIS — Z880 Allergy status to penicillin: Secondary | ICD-10-CM

## 2023-07-15 DIAGNOSIS — I428 Other cardiomyopathies: Secondary | ICD-10-CM | POA: Diagnosis not present

## 2023-07-15 DIAGNOSIS — Z95828 Presence of other vascular implants and grafts: Secondary | ICD-10-CM

## 2023-07-15 DIAGNOSIS — Z8673 Personal history of transient ischemic attack (TIA), and cerebral infarction without residual deficits: Secondary | ICD-10-CM

## 2023-07-15 DIAGNOSIS — Z8249 Family history of ischemic heart disease and other diseases of the circulatory system: Secondary | ICD-10-CM

## 2023-07-15 DIAGNOSIS — Z7982 Long term (current) use of aspirin: Secondary | ICD-10-CM

## 2023-07-15 DIAGNOSIS — Z885 Allergy status to narcotic agent status: Secondary | ICD-10-CM

## 2023-07-15 DIAGNOSIS — Z86711 Personal history of pulmonary embolism: Secondary | ICD-10-CM

## 2023-07-15 DIAGNOSIS — Z86718 Personal history of other venous thrombosis and embolism: Secondary | ICD-10-CM

## 2023-07-15 DIAGNOSIS — K219 Gastro-esophageal reflux disease without esophagitis: Secondary | ICD-10-CM | POA: Diagnosis present

## 2023-07-15 DIAGNOSIS — Z881 Allergy status to other antibiotic agents status: Secondary | ICD-10-CM

## 2023-07-15 LAB — CBC
HCT: 36.4 % (ref 36.0–46.0)
HCT: 37.1 % (ref 36.0–46.0)
Hemoglobin: 12 g/dL (ref 12.0–15.0)
Hemoglobin: 12.1 g/dL (ref 12.0–15.0)
MCH: 30.2 pg (ref 26.0–34.0)
MCH: 29.6 pg (ref 26.0–34.0)
MCHC: 33 g/dL (ref 30.0–36.0)
MCHC: 32.6 g/dL (ref 30.0–36.0)
MCV: 91.5 fL (ref 80.0–100.0)
MCV: 90.7 fL (ref 80.0–100.0)
Platelets: 149 10*3/uL — ABNORMAL LOW (ref 150–400)
Platelets: 140 10*3/uL — ABNORMAL LOW (ref 150–400)
RBC: 3.98 MIL/uL (ref 3.87–5.11)
RBC: 4.09 MIL/uL (ref 3.87–5.11)
RDW: 14.9 % (ref 11.5–15.5)
RDW: 14.8 % (ref 11.5–15.5)
WBC: 5.2 10*3/uL (ref 4.0–10.5)
WBC: 5.5 10*3/uL (ref 4.0–10.5)
nRBC: 0 % (ref 0.0–0.2)
nRBC: 0 % (ref 0.0–0.2)

## 2023-07-15 LAB — BASIC METABOLIC PANEL
Anion gap: 8 (ref 5–15)
Anion gap: 9 (ref 5–15)
BUN: 44 mg/dL — ABNORMAL HIGH (ref 8–23)
BUN: 37 mg/dL — ABNORMAL HIGH (ref 8–23)
CO2: 27 mmol/L (ref 22–32)
CO2: 27 mmol/L (ref 22–32)
Calcium: 9.4 mg/dL (ref 8.9–10.3)
Calcium: 9.1 mg/dL (ref 8.9–10.3)
Chloride: 103 mmol/L (ref 98–111)
Chloride: 103 mmol/L (ref 98–111)
Creatinine, Ser: 1.49 mg/dL — ABNORMAL HIGH (ref 0.44–1.00)
Creatinine, Ser: 1.28 mg/dL — ABNORMAL HIGH (ref 0.44–1.00)
GFR, Estimated: 35 mL/min — ABNORMAL LOW (ref >60)
GFR, Estimated: 42 mL/min — ABNORMAL LOW (ref >60)
Glucose, Bld: 110 mg/dL — ABNORMAL HIGH (ref 70–99)
Glucose, Bld: 90 mg/dL (ref 70–99)
Potassium: 4.3 mmol/L (ref 3.5–5.1)
Potassium: 3.9 mmol/L (ref 3.5–5.1)
Sodium: 138 mmol/L (ref 135–145)
Sodium: 139 mmol/L (ref 135–145)

## 2023-07-15 LAB — ECHOCARDIOGRAM COMPLETE
Area-P 1/2: 3.4 cm2
Calc EF: 41.5 %
Height: 63 in
S' Lateral: 4.5 cm
Single Plane A2C EF: 42.2 %
Single Plane A4C EF: 42.4 %
Weight: 2857.16 [oz_av]

## 2023-07-15 LAB — TROPONIN I (HIGH SENSITIVITY)
Troponin I (High Sensitivity): 23 ng/L — ABNORMAL HIGH (ref <18)
Troponin I (High Sensitivity): 79 ng/L — ABNORMAL HIGH (ref <18)
Troponin I (High Sensitivity): 175 ng/L (ref <18)
Troponin I (High Sensitivity): 169 ng/L (ref <18)
Troponin I (High Sensitivity): 158 ng/L (ref <18)

## 2023-07-15 LAB — HEPARIN LEVEL (UNFRACTIONATED): Heparin Unfractionated: 0.44 [IU]/mL (ref 0.30–0.70)

## 2023-07-15 LAB — BRAIN NATRIURETIC PEPTIDE: B Natriuretic Peptide: 564.7 pg/mL — ABNORMAL HIGH (ref 0.0–100.0)

## 2023-07-15 MED ORDER — PANTOPRAZOLE SODIUM 40 MG PO TBEC
80.0000 mg | DELAYED_RELEASE_TABLET | Freq: Every day | ORAL | Status: DC
  Administered 2023-07-16 – 2023-07-18 (×3): 80 mg via ORAL
  Filled 2023-07-15 (×3): qty 2

## 2023-07-15 MED ORDER — SODIUM CHLORIDE 0.9% FLUSH: 3.0000 mL | INTRAVENOUS | Status: DC | PRN

## 2023-07-15 MED ORDER — CYCLOSPORINE 0.05 % OP EMUL
1.0000 [drp] | Freq: Two times a day (BID) | OPHTHALMIC | Status: DC
  Administered 2023-07-15 – 2023-07-18 (×6): 1 [drp] via OPHTHALMIC
  Filled 2023-07-15 (×7): qty 30

## 2023-07-15 MED ORDER — FERROUS SULFATE 325 (65 FE) MG PO TABS
325.0000 mg | ORAL_TABLET | Freq: Every day | ORAL | Status: DC
  Administered 2023-07-15 – 2023-07-17 (×3): 325 mg via ORAL
  Filled 2023-07-15 (×6): qty 1

## 2023-07-15 MED ORDER — TEMAZEPAM 15 MG PO CAPS
15.0000 mg | ORAL_CAPSULE | Freq: Every day | ORAL | Status: DC
  Administered 2023-07-15 – 2023-07-17 (×3): 15 mg via ORAL
  Filled 2023-07-15 (×3): qty 1

## 2023-07-15 MED ORDER — ONDANSETRON HCL 4 MG/2ML IJ SOLN: 4.0000 mg | Freq: Four times a day (QID) | INTRAMUSCULAR | Status: DC | PRN

## 2023-07-15 MED ORDER — ACETAMINOPHEN 325 MG PO TABS
650.0000 mg | ORAL_TABLET | ORAL | Status: DC | PRN
  Administered 2023-07-16 (×3): 650 mg via ORAL
  Filled 2023-07-15 (×3): qty 2

## 2023-07-15 MED ORDER — ASPIRIN 81 MG PO TBEC
81.0000 mg | DELAYED_RELEASE_TABLET | Freq: Every day | ORAL | Status: DC
  Administered 2023-07-16 – 2023-07-18 (×3): 81 mg via ORAL
  Filled 2023-07-15 (×3): qty 1

## 2023-07-15 MED ORDER — HEPARIN (PORCINE) 25000 UT/250ML-% IV SOLN
1000.0000 [IU]/h | INTRAVENOUS | Status: DC
  Administered 2023-07-15 – 2023-07-16 (×2): 1000 [IU]/h via INTRAVENOUS
  Filled 2023-07-15 (×3): qty 250

## 2023-07-15 MED ORDER — SODIUM CHLORIDE 0.9% FLUSH
3.0000 mL | Freq: Two times a day (BID) | INTRAVENOUS | Status: DC
  Administered 2023-07-15 – 2023-07-16 (×2): 3 mL via INTRAVENOUS

## 2023-07-15 MED ORDER — CALCIUM POLYCARBOPHIL 625 MG PO TABS
625.0000 mg | ORAL_TABLET | Freq: Two times a day (BID) | ORAL | Status: DC
  Administered 2023-07-15 – 2023-07-18 (×6): 625 mg via ORAL
  Filled 2023-07-15 (×8): qty 1

## 2023-07-15 MED ORDER — IOHEXOL 350 MG/ML SOLN
60.0000 mL | Freq: Once | INTRAVENOUS | Status: AC | PRN
Start: 2023-07-15 — End: 2023-07-15
  Administered 2023-07-15: 60 mL via INTRAVENOUS

## 2023-07-15 MED ORDER — ALLOPURINOL 300 MG PO TABS
300.0000 mg | ORAL_TABLET | Freq: Every morning | ORAL | Status: DC
Start: 2023-07-16 — End: 2023-07-18
  Administered 2023-07-16 – 2023-07-18 (×3): 300 mg via ORAL
  Filled 2023-07-15 (×4): qty 1

## 2023-07-15 MED ORDER — HEPARIN BOLUS VIA INFUSION
4000.0000 [IU] | Freq: Once | INTRAVENOUS | Status: AC
  Administered 2023-07-15: 4000 [IU] via INTRAVENOUS
  Filled 2023-07-15: qty 4000

## 2023-07-15 MED ORDER — PRAMIPEXOLE DIHYDROCHLORIDE 0.25 MG PO TABS
0.5000 mg | ORAL_TABLET | Freq: Every day | ORAL | Status: DC
  Administered 2023-07-15 – 2023-07-17 (×3): 0.5 mg via ORAL
  Filled 2023-07-15 (×3): qty 2

## 2023-07-15 MED ORDER — METOPROLOL SUCCINATE ER 25 MG PO TB24
25.0000 mg | ORAL_TABLET | Freq: Every day | ORAL | Status: DC
  Administered 2023-07-15 – 2023-07-18 (×4): 25 mg via ORAL
  Filled 2023-07-15 (×4): qty 1

## 2023-07-15 MED ORDER — NITROGLYCERIN 0.4 MG SL SUBL: 0.4000 mg | SUBLINGUAL_TABLET | SUBLINGUAL | Status: DC | PRN

## 2023-07-15 MED ORDER — SODIUM CHLORIDE 0.9 % IV SOLN
250.0000 mL | INTRAVENOUS | Status: DC | PRN
  Administered 2023-07-16: 250 mL via INTRAVENOUS

## 2023-07-15 MED ORDER — PERFLUTREN LIPID MICROSPHERE
1.0000 mL | INTRAVENOUS | Status: AC | PRN
  Administered 2023-07-15: 2 mL via INTRAVENOUS

## 2023-07-15 MED ORDER — ATORVASTATIN CALCIUM 40 MG PO TABS
40.0000 mg | ORAL_TABLET | Freq: Every day | ORAL | Status: DC
  Administered 2023-07-15 – 2023-07-17 (×3): 40 mg via ORAL
  Filled 2023-07-15 (×3): qty 1

## 2023-07-15 MED ORDER — ASPIRIN 81 MG PO CHEW
324.0000 mg | CHEWABLE_TABLET | Freq: Once | ORAL | Status: AC
  Administered 2023-07-15: 324 mg via ORAL
  Filled 2023-07-15: qty 4

## 2023-07-15 MED ORDER — ADULT MULTIVITAMIN W/MINERALS CH
1.0000 | ORAL_TABLET | Freq: Every morning | ORAL | Status: DC
  Administered 2023-07-16 – 2023-07-18 (×3): 1 via ORAL
  Filled 2023-07-15 (×3): qty 1

## 2023-07-15 NOTE — ED Notes (Signed)
 Called report to [facility] charge RN, [place]. Updated him on pt's Troponin that is trending up. No physician currently assigned to pt to make aware of recent Troponin level.   Robina Ade, RN

## 2023-07-15 NOTE — Progress Notes (Signed)
 PHARMACY - ANTICOAGULATION CONSULT NOTE  Pharmacy Consult for heparin Indication: atrial fibrillation  Allergies  Allergen Reactions   Morphine And Codeine Other (See Comments)    LARGER DOSES OF MORPHINE CAUSES BODY TWITCHING   Penicillins Anaphylaxis    Childhood reaction Has patient had a PCN reaction causing immediate rash, facial/tongue/throat swelling, SOB or lightheadedness with hypotension: Yes Has patient had a PCN reaction causing severe rash involving mucus membranes or skin necrosis: Unknown Has patient had a PCN reaction that required hospitalization: No Has patient had a PCN reaction occurring within the last 10 years: no (5 or 83 yrs old) If all of the above answers are "NO", then may proceed with Cephalosporin use.    Baclofen Other (See Comments)    Other reaction(s): Unknown   Celecoxib Other (See Comments)    Other reaction(s): Unknown   Doxycycline Other (See Comments)   Levofloxacin Other (See Comments)    Other reaction(s): abnl taste   Penicillin G Benzathine Other (See Comments)   Pneumococcal Vac Polyvalent Other (See Comments)    Other reaction(s): local reaction   Sulfa Antibiotics Other (See Comments)    Unknown childhood reaction   Sulfamethoxazole Other (See Comments)   Tetanus Toxoid, Adsorbed Other (See Comments)    Other reaction(s): local reaction    Patient Measurements: Height: 5\' 3"  (160 cm) Weight: 81 kg (178 lb 9.2 oz) IBW/kg (Calculated) : 52.4 Heparin Dosing Weight: 70 kg  Vital Signs: Temp: 98.Jodi F (37 C) (03/19 1015) Temp Source: Oral (03/19 1015) BP: 132/74 (03/19 1030) Pulse Rate: 77 (03/19 1055)  Labs: Recent Labs    07/15/23 0231 07/15/23 0430 07/15/23 0810 07/15/23 1012  HGB 12.0  --   --   --   HCT 36.4  --   --   --   PLT 149*  --   --   --   CREATININE 1.49*  --   --  1.28*  TROPONINIHS 23* 79* 175* 169*    Estimated Creatinine Clearance: 33.5 mL/min (A) (by C-G formula based on SCr of 1.28 mg/dL  (H)).   Medical History: Past Medical History:  Diagnosis Date   Anemia    Arthritis    osteoarthritis Shoulder,neck   Bell's palsy 1980s   CHF (congestive heart failure) (HCC) 06/2019   Chronic kidney disease    Stage III   Dyspnea 06/2019   On exertion   GERD (gastroesophageal reflux disease)    Hemochromatosis    History of hiatal hernia    Hypertension    Iron deficiency anemia due to chronic blood loss 03/10/2017   PE (pulmonary embolism) 2007   x2   Peripheral vascular disease (HCC)    DVT Rt leg   Pneumonia 1990s   "walking" pneumonia   Prothrombin gene mutation (HCC) 05/30/2013   Restless legs    Right leg DVT (HCC) 05/30/2013   Stroke (HCC)    found on a MRI, she's not aware otherwise      Assessment: [name] presenting with chest pain. No oral anticoagulation reported prior to admission, but previously took Xarelto for chronic DVT/PE. Patient found to be in atrial fibrillation. Pharmacy consulted to manage heparin infusion.    Goal of Therapy:  Heparin level 0.3-0.7 units/ml Monitor platelets by anticoagulation protocol: Yes   Plan:  Give 4000 units bolus x 1 Start heparin infusion at 1000 units/hr Check anti-Xa level in 8 hours and daily while on heparin Continue to monitor H&H and platelets  Ruben Im, PharmD  Clinical Pharmacist 07/15/2023 12:59 PM Please check AMION for all [facility] Pharmacy numbers

## 2023-07-15 NOTE — ED Provider Notes (Signed)
 Patient sent in from med [facility] for cardiology evaluation.  Patient has been seen by the cardiology PA.  They are working on the game plan sounds like they are planning echocardiogram maybe cardiac cath.  Patient's been having climbing troponins the most recent was 175 for the morning it was 79.  Patient did arrive with atrial fibs which spontaneously converted at the med center did not require any intervention.  Patient still with a little bit of chest discomfort but she is hemodynamically stable.  Workup and disposition as per cardiology.   Vanetta Mulders, MD 07/15/23 1038

## 2023-07-15 NOTE — Consult Note (Addendum)
 Cardiology Consultation   Patient ID: [name] MRN: [id number]; DOB: 1940-12-01  Admit date: 07/15/2023 Date of Consult: 07/15/2023  PCP:  Jodi Retort, MD   [facility] Providers Cardiologist:  Jodi Batty, MD   Patient Profile:   [name] is a 83 y.o. female with a hx of history of chronic DVT/PE no longer on Xarelto with IVC filter placed 02/2020 and removed 05/2020, LBBB, CVA, IDDA, CKD 3, thrombophilia with prothrombin gene mutation, nonobstructive CAD, NICM, hypertension, hyperlipidemia, and family history of CAD who is being seen 07/15/2023 for the evaluation of chest pain at the request of Dr. Madilyn [name].  History of Present Illness:   [name] underwent echocardiogram 05/2019 that showed an LVEF 45-50% with mild asymmetric LVH, grade 1 DD, mild MR.  Nuclear stress test 06/2019 showed an EF of 29% and concern for a small defect in the mid anteroseptal and apical septal location.  Subsequent left and right heart catheterization 06/2019 showed 40 to 50% proximal LAD lesion that was nonobstructive, no PCI.  LVEDP was 25 mmHg with mildly elevated right heart pressures and a PASP 37 mmHg.  Medical management was recommended for LV dysfunction felt to be related to nonischemic cardiomyopathy.  She had an IVC filter placed on 03/12/2020 by [facility] prior to her right total knee arthroplasty, IVC filter was removed 05/2020.  Echocardiogram September 2023 showed an LVEF 40-45% with mild to moderate MR, grade 2 DD with global hypokinesis.  Due to unilateral leg swelling, UV Dopplers 09/2022 showed chronic right lower extremity DVT unchanged from prior studies in 2016, 2017, and 2019.  She follows with hematology for decisions regarding anticoagulation.  She was last seen in clinic 03/2023 and was ambulating with a walker.  She has chronic swelling of her left lower extremity and chronic mild dyspnea.  She has been maintained on ASA, 40 mg lipitor, 80 mg lasix pAM and 40 mg pPM,  50 mg losartan, toprol 25 mg.  She presented to [facility] 07/15/2023 with chest pain and rapid heartbeat.  Given history of PE/DVT, CTA was obtained and ruled out PE.  Chest pain started at 10 PM last evening described as a tight and dull sensation across her central chest.  She does report diaphoresis and shortness of breath but no nausea or vomiting.  She also started her CPAP therapy 3 weeks ago for sleep apnea.  On arrival, HR 150, BP 128/101, 95% on room air BNP 565 HS troponin 23 --> 79 --> 175 EKG with HR 141, LBBB (old) irregular pattern suggestive of Afib Subsequent tracings with NSR and LBBB  sCr 1.49 - baseline appears 1.3-1.6  Cardiology was consulted for concern for unstable angina and recommended ER to ER transfer.   She reports taking Lasix 80 mg every morning.  She is struggling to get used to her CPAP.  She went to a restaurant and ate pizza on Monday evening and has subsequently noted increased swelling in her legs.  She has a chronic shortness of breath.  She is fairly sedentary and ambulates with a walker in the house, has not had recent chest discomfort.  At approximately 10 PM Tuesday evening, she experienced onset of 2-3 out of 10 chest discomfort that felt like a pressure.  There is question of this radiated to her left arm as she had had left arm pain the day before.  This was associated with diaphoresis, exacerbated by her trying to take off her CPAP in  order to come to the ER.  Chest pain persisted for "several hours."  She received 324 mg aspirin at [facility] but no SL NTG.  She is unsure what made the chest pain stop, she is currently chest pain-free.  She states that she has chronic pain from arthritis but knew this chest discomfort was different.  She is currently resting comfortably, husband at bedside.  Past Medical History:  Diagnosis Date   Anemia    Arthritis    osteoarthritis Shoulder,neck   Bell's palsy 1980s   CHF (congestive heart  failure) (HCC) 06/2019   Chronic kidney disease    Stage III   Dyspnea 06/2019   On exertion   GERD (gastroesophageal reflux disease)    Hemochromatosis    History of hiatal hernia    Hypertension    Iron deficiency anemia due to chronic blood loss 03/10/2017   PE (pulmonary embolism) 2007   x2   Peripheral vascular disease (HCC)    DVT Rt leg   Pneumonia 1990s   "walking" pneumonia   Prothrombin gene mutation (HCC) 05/30/2013   Restless legs    Right leg DVT (HCC) 05/30/2013   Stroke (HCC)    found on a MRI, she's not aware otherwise    Past Surgical History:  Procedure Laterality Date   BACK SURGERY     CARDIAC CATHETERIZATION Bilateral 2005   COLONOSCOPY     IVC FILTER INSERTION N/A 03/12/2020   Procedure: IVC FILTER INSERTION;  Surgeon: Maeola Harman, MD;  Location[facility] INVASIVE CV LAB;  Service: Cardiovascular;  Laterality: N/A;   IVC FILTER REMOVAL N/A 06/11/2020   Procedure: IVC FILTER REMOVAL;  Surgeon: Maeola Harman, MD;  Location: [facility] INVASIVE CV LAB;  Service: Cardiovascular;  Laterality: N/A;   REVERSE SHOULDER ARTHROPLASTY Right 03/07/2021   Procedure: REVERSE SHOULDER ARTHROPLASTY;  Surgeon: Francena Hanly, MD;  Location: [facility] ORS;  Service: Orthopedics;  Laterality: Right;    RIGHT/LEFT HEART CATH AND CORONARY ANGIOGRAPHY N/A 07/12/2019   Procedure: RIGHT/LEFT HEART CATH AND CORONARY ANGIOGRAPHY;  Surgeon: Lennette Bihari, MD;  Location: [facility] INVASIVE CV LAB;  Service: Cardiovascular;  Laterality: N/A;   SHOULDER SURGERY Bilateral    TOTAL KNEE ARTHROPLASTY Right 03/16/2020   Procedure: TOTAL KNEE ARTHROPLASTY;  Surgeon: Jene Every, MD;  Location: [facility] ORS;  Service: Orthopedics;  Laterality: Right;  2.5 hrs     Home Medications:  Prior to Admission medications   Medication Sig Start Date End Date Taking? Authorizing Provider  acetaminophen (TYLENOL) 500 MG tablet Take 1,000 mg by mouth every 6 (six) hours as needed. 01/08/23   [provider]  allopurinol (ZYLOPRIM) 300 MG tablet Take 300 mg by mouth in the morning. 11/12/20   [provider]  aspirin EC 81 MG tablet Take 162 mg by mouth in the morning. Swallow whole.    [provider]  atorvastatin (LIPITOR) 40 MG tablet Take 1 tablet (40 mg total) by mouth daily at 6 PM. 07/13/19   Meredeth Ide, MD  Biotin 1000 MCG tablet Take 1,000 mcg by mouth in the morning.    [provider]  Calcium Carb-Cholecalciferol (CALCIUM 600+D3 PO) Take 1 tablet by mouth in the morning and at bedtime.    [provider]  cholecalciferol (VITAMIN D) 25 MCG (1000 UNIT) tablet Take 1,000 Units by mouth in the morning and at bedtime.    [provider]  clindamycin (CLEOCIN) 150 MG capsule Take 4 capsules by  mouth. 4 tablets prior to dental procedures Patient not taking: Reported on 01/13/2023    [provider]  colchicine 0.6 MG tablet Take 0.6 mg by mouth daily. PRN    [provider]  cycloSPORINE (RESTASIS) 0.05 % ophthalmic emulsion Place 1 drop into both eyes 2 (two) times daily.    [provider]  ferrous sulfate 325 (65 FE) MG EC tablet Take 325 mg by mouth in the morning.    [provider]  furosemide (LASIX) 80 MG tablet Take 1 tablet (80 mg total) by mouth daily. 06/26/23   Runell Gess, MD  Hypromellose (ARTIFICIAL TEARS OP) Place 1 drop into both eyes 3 (three) times daily as needed (dry eyes).    [provider]  losartan (COZAAR) 50 MG tablet Take 1 tablet (50 mg total) by mouth daily. 07/13/19   Meredeth Ide, MD  metoprolol succinate (TOPROL-XL) 25 MG 24 hr tablet Take 1 tablet by mouth once daily 12/01/22   Runell Gess, MD  Multiple Vitamin (MULTIVITAMIN WITH MINERALS) TABS tablet Take 1 tablet by mouth in the morning. Centrum Silver (NO IRON)    [provider]  Omega-3 Fatty Acids (FISH OIL PO) Take 1 capsule by mouth in the morning.    [provider]   omeprazole (PRILOSEC) 20 MG capsule Take 20 mg by mouth daily. In PM and 40 mg in AM 07/04/19   [provider]  omeprazole (PRILOSEC) 40 MG capsule Take 40 mg by mouth daily. In AM and 20 mg in PM 01/05/23   [provider]  oxyCODONE (ROXICODONE) 5 MG immediate release tablet Take 1 tablet (5 mg total) by mouth every 6 (six) hours as needed for severe pain (pain score 7-10). 03/06/23   Lonell Grandchild, MD  polycarbophil (FIBERCON) 625 MG tablet Take 625 mg by mouth in the morning and at bedtime.    [provider]  potassium chloride SA (KLOR-CON M) 20 MEQ tablet Take with furosemide on Friday and Saturday for two doses. Patient not taking: Reported on 04/08/2023 08/29/21   Ronney Asters, NP  pramipexole (MIRAPEX) 0.5 MG tablet Take 1 mg by mouth daily.    [provider]  temazepam (RESTORIL) 15 MG capsule Take 1 capsule (15 mg total) by mouth at bedtime. 12/02/16   Love, Evlyn Kanner, PA-C  Turmeric Curcumin 500 MG CAPS Take 500 mg by mouth at bedtime.     [provider]  vitamin E 180 MG (400 UNITS) capsule Take 400 Units by mouth at bedtime.    [provider]    Inpatient Medications: Scheduled Meds:  Continuous Infusions:  PRN Meds:   Allergies:    Allergies  Allergen Reactions   Morphine And Codeine Other (See Comments)    LARGER DOSES OF MORPHINE CAUSES BODY TWITCHING   Penicillins Anaphylaxis    Childhood reaction Has patient had a PCN reaction causing immediate rash, facial/tongue/throat swelling, SOB or lightheadedness with hypotension: Yes Has patient had a PCN reaction causing severe rash involving mucus membranes or skin necrosis: Unknown Has patient had a PCN reaction that required hospitalization: No Has patient had a PCN reaction occurring within the last 10 years: no (5 or 83 yrs old) If all of the above answers are "NO", then may proceed with Cephalosporin use.    Baclofen Other (See Comments)    Other  reaction(s): Unknown   Celecoxib Other (See Comments)    Other reaction(s): Unknown   Doxycycline Other (  See Comments)   Levofloxacin Other (See Comments)    Other reaction(s): abnl taste   Penicillin G Benzathine Other (See Comments)   Pneumococcal Vac Polyvalent Other (See Comments)    Other reaction(s): local reaction   Sulfa Antibiotics Other (See Comments)    Unknown childhood reaction   Sulfamethoxazole Other (See Comments)   Tetanus Toxoid, Adsorbed Other (See Comments)    Other reaction(s): local reaction    Social History:   Social History   Socioeconomic History   Marital status: Married    Spouse name: Not on file   Number of children: Not on file   Years of education: Not on file   Highest education level: Not on file  Occupational History   Not on file  Tobacco Use   Smoking status: Never   Smokeless tobacco: Never   Tobacco comments:    never used tobacco  Vaping Use   Vaping status: Never Used  Substance and Sexual Activity   Alcohol use: No    Alcohol/week: 0.0 standard drinks of alcohol   Drug use: No   Sexual activity: Not Currently  Other Topics Concern   Not on file  Social History Narrative   Not on file   Social Drivers of Health   Financial Resource Strain: Not on file  Food Insecurity: Low Risk  (12/09/2022)   Received from [facility]   Hunger Vital Sign    Worried About Running Out of Food in the Last Year: Never true    Ran Out of Food in the Last Year: Never true  Transportation Needs: Not on file (12/09/2022)  Physical Activity: Not on file  Stress: Not on file  Social Connections: Unknown (06/25/2022)   [facility] from [facility]   Social Network    Social Network: Not on file  Intimate Partner Violence: Unknown (06/25/2022)   Received from [facility]   HITS    Physically Hurt: Not on file    Insult or Talk Down To: Not on file    Threaten Physical Harm: Not on file    Scream or Curse: Not  on file    Family History:    Family History  Problem Relation Age of Onset   Congestive Heart Failure Mother    Pancreatic cancer Father      ROS:  Please see the history of present illness.   All other ROS reviewed and negative.     Physical Exam/Data:   Vitals:   07/15/23 0945 07/15/23 1000 07/15/23 1015 07/15/23 1030  BP: 127/70 135/67 (!) 128/59 132/74  Pulse: 75 75 73 75  Resp: (!) 27 (!) 21 (!) 22 19  Temp:      TempSrc:      SpO2: 96% 98% 98% 97%   No intake or output data in the 24 hours ending 07/15/23 1042    04/08/2023   10:50 AM 03/06/2023    6:57 AM 01/13/2023    1:34 PM  Last 3 Weights  Weight (lbs) 181 lb 180 lb 178 lb  Weight (kg) 82.101 kg 81.647 kg 80.74 kg     There is no height or weight on file to calculate BMI.  General:  elderly female in NAD HEENT: normal Neck: + JVD Vascular: No carotid bruits; Distal pulses 2+ bilaterally Cardiac:  normal S1, S2; RRR; no murmur  Lungs:  clear to auscultation bilaterally, no wheezing, rhonchi or rales  Abd: soft, nontender, no hepatomegaly  Ext: mild B LE edema  Musculoskeletal:  No deformities, BUE and BLE strength normal and equal Skin: warm and dry  Neuro:  CNs 2-12 intact, no focal abnormalities noted Psych:  Normal affect   EKG:  The EKG was personally reviewed and demonstrates:  SR HR 83 with LBBB Telemetry:  Telemetry was personally reviewed and demonstrates:  SR with HR 80s, PVCs  Relevant CV Studies:  Echo 09/2022: 1. Left ventricular ejection fraction, by estimation, is 40 to 45%. The  left ventricle has mildly decreased function. The left ventricle has no  regional wall motion abnormalities. Left ventricular diastolic parameters  are consistent with Grade I  diastolic dysfunction (impaired relaxation). Significant dyssynchrony due  to underlying LBBB.   2. Right ventricular systolic function is normal. The right ventricular  size is normal.   3. Left atrial size was severely dilated.    4. The mitral valve is normal in structure. Trivial mitral valve  regurgitation. No evidence of mitral stenosis.   5. The aortic valve is normal in structure. Aortic valve regurgitation is  not visualized. No aortic stenosis is present.   6. The inferior vena cava is normal in size with greater than 50%  respiratory variability, suggesting right atrial pressure of 3 mmHg.   Laboratory Data:  High Sensitivity Troponin:   Recent Labs  Lab 07/15/23 0231 07/15/23 0430 07/15/23 0810  TROPONINIHS 23* 79* 175*     Chemistry Recent Labs  Lab 07/15/23 0231  NA 138  K 4.3  CL 103  CO2 27  GLUCOSE 110*  BUN 44*  CREATININE 1.49*  CALCIUM 9.4  GFRNONAA 35*  ANIONGAP 8    No results for input(s): "PROT", "ALBUMIN", "AST", "ALT", "ALKPHOS", "BILITOT" in the last 168 hours. Lipids No results for input(s): "CHOL", "TRIG", "HDL", "LABVLDL", "LDLCALC", "CHOLHDL" in the last 168 hours.  Hematology Recent Labs  Lab 07/15/23 0231  WBC 5.2  RBC 3.98  HGB 12.0  HCT 36.4  MCV 91.5  MCH 30.2  MCHC 33.0  RDW 14.9  PLT 149*   Thyroid No results for input(s): "TSH", "FREET4" in the last 168 hours.  BNP Recent Labs  Lab 07/15/23 0249  BNP 564.7*    DDimer No results for input(s): "DDIMER" in the last 168 hours.   Radiology/Studies:  CT Angio Chest PE W/Cm &/Or Wo Cm Result Date: 07/15/2023 CLINICAL DATA:  Chest pain, shortness of breath, left arm pain, history of CHF EXAM: CT ANGIOGRAPHY CHEST WITH CONTRAST TECHNIQUE: Multidetector CT imaging of the chest was performed using the standard protocol during bolus administration of intravenous contrast. Multiplanar CT image reconstructions and MIPs were obtained to evaluate the vascular anatomy. RADIATION DOSE REDUCTION: This exam was performed according to the departmental dose-optimization program which includes automated exposure control, adjustment of the mA and/or kV according to patient size and/or use of iterative reconstruction  technique. CONTRAST:  60mL OMNIPAQUE IOHEXOL 350 MG/ML SOLN COMPARISON:  Same day chest radiograph and CT chest 03/06/2023 FINDINGS: Cardiovascular: Mild cardiomegaly. No pericardial effusion. Normal caliber thoracic aorta. Aortic and coronary artery atherosclerotic calcification. Negative for pulmonary embolism. Mediastinum/Nodes: Moderate hiatal hernia. Trachea is unremarkable. No thoracic adenopathy. Lungs/Pleura: No focal consolidation, pleural effusion, or pneumothorax. Scarring in the lower lobes. Upper Abdomen: No acute abnormality. Musculoskeletal: Postoperative change right shoulder. No acute fracture. Chronic posterior left rib fractures. Review of the MIP images confirms the above findings. IMPRESSION: 1. Negative for pulmonary embolism. 2. Moderate hiatal hernia. 3. Aortic Atherosclerosis (ICD10-I70.0). Electronically Signed   By: Angelique Holm.D.  On: 07/15/2023 03:37   DG Chest 2 View Result Date: 07/15/2023 CLINICAL DATA:  Chest pain with shortness of breath and left arm pain. EXAM: CHEST - 2 VIEW COMPARISON:  Sep 05, 2022 FINDINGS: The heart size and mediastinal contours are within normal limits. Moderate to marked severity calcification of the aortic arch is noted. Both lungs are clear. There is a stable, moderate-sized hiatal hernia. A right shoulder replacement is noted. Multilevel degenerative changes are seen throughout the thoracic spine. IMPRESSION: 1. No active cardiopulmonary disease. 2. Stable, moderate-sized hiatal hernia. Electronically Signed   By: Aram Candela M.D.   On: 07/15/2023 02:53     Assessment and Plan:   Chest pain CAD - she has known nonobstructive CAD in her LAD - echo last year with further reduction in LVEF to 40-45% - presenting with symptoms concerning for unstable angina - troponin mildly elevated, will trend an additional 2 values - will likely need an ischemic evaluation - given CKD and CTA at [facility], will draw another BMP to check  renal function, hold off on fluids given hypervolemia - will obtain updated echo - if LVEF depressed and troponin significantly elevated, consider repeat left and right heart cath, timing depending on renal function and symptoms   Acute on Chronic systolic heart failure Grade 1 DD CKD stage III - LVEF 40-45% - maintained on 80 mg lasix daily - dietary indiscretion Monday night - elevated BNP on arrival and exam consistent with hypervolemia - sCr 1.49 - still at baseline, prior to CTA - repeat BMP pending - will hold losartan - continue toprol   Afib with RVR - new diagnosis LBBB Severe LAE - initial EKG with irregular tachycardia with LBBB concerning for Afib RVR - she was unaware of tachycardia - noted to have severe LAE on echo last year - now in SR, I can't see telemetry from [facility] - recommend starting heparin gtt - may need to consider multaq   Hyperlipidemia with LDL goal < 70 Continue 40 mg lipitor   Chest pain could be related to mild hypervolemia, unstable angina, or Afib RVR. I have ordered an additional CE x 2, BMP, and echocardiogram. If troponin significantly elevated, may need repeat R/L HC. She received contrast with CTA to rule out PE. Will await BMP and repeat echo to determine next steps. She is also volume up on exam - she is maintained on 80 mg laisx daily. Start heparin gtt.    Risk Assessment/Risk Scores:     TIMI Risk Score for Unstable Angina or Non-ST Elevation MI:   The patient's TIMI risk score is 3, which indicates a 13% risk of all cause mortality, new or recurrent myocardial infarction or need for urgent revascularization in the next 14 days.  New York Heart Association (NYHA) Functional Class NYHA Class III  CHA2DS2-VASc Score = 6   This indicates a 9.7% annual risk of stroke. The patient's score is based upon: CHF History: 1 HTN History: 1 Diabetes History: 0 Stroke History: 0 Vascular Disease History: 1 Age Score: 2 Gender  Score: 1     For questions or updates, please contact [facility] Please consult [email or web address] for contact info under    Signed, Marcelino Duster, [place]  07/15/2023 10:42 AM  Patient seen, examined. Available data reviewed. Agree with findings, assessment, and plan as outlined by Bettina Gavia, PA.  The patient is independently interviewed and examined.  She is an elderly woman in no distress.  HEENT is normal, JVP is mildly elevated, carotid upstrokes normal without bruits, lungs are clear bilaterally, heart is regular rate and rhythm with no murmur gallop, abdomen is soft and nontender with no masses, extremities have no edema.  EKGs were reviewed and she initially is shown to be in atrial fibrillation with RVR with ventricular rate of 141 bpm and left bundle branch block.  Her follow-up EKG demonstrates normal sinus rhythm 83 bpm with left bundle branch block.  I think her presentation is consistent with symptomatic atrial fibrillation with RVR and demand ischemia with a mild elevation of high-sensitivity troponin, troponin trend 23--->79--->175--->169.  Chest CTA negative for PE. Baseline echo from 2024 with moderate LV dysfunction and LVEF 40-45% with global LV hypokinesis. Recommend initial treatment with unfractionated heparin, consider early conversion to a direct oral anticoagulant drug as long if she does not require any procedures.  Her sinus rate is in the 50's and I don't think she will tolerate AV nodal blockade. Dronedarone could be considered for AAD Rx if recurrent AFib. Await 2D echocardiogram.  As long as she does not have marked change in LVEF and/or regional wall motion abnormalities, I am not inclined to pursue cardiac catheterization as she again has had only low-level troponin elevation in the setting of known nonobstructive CAD from cardiac catheterization in 2021.  If no significant clinical changes overnight, consider hospital DC tomorrow with an outpatient ZIO to assess  AF burden. Otherwise, as outlined above. Plan reviewed in detail with the patient and her husband who is present at the bedside.   Tonny Bollman, M.D. 07/15/2023 12:39 PM

## 2023-07-15 NOTE — Progress Notes (Signed)
 PHARMACY - ANTICOAGULATION CONSULT NOTE  Pharmacy Consult for heparin Indication: atrial fibrillation  Allergies  Allergen Reactions   Morphine And Codeine Other (See Comments)    LARGER DOSES OF MORPHINE CAUSES BODY TWITCHING   Penicillins Anaphylaxis    Childhood reaction Has patient had a PCN reaction causing immediate rash, facial/tongue/throat swelling, SOB or lightheadedness with hypotension: Yes Has patient had a PCN reaction causing severe rash involving mucus membranes or skin necrosis: Unknown Has patient had a PCN reaction that required hospitalization: No Has patient had a PCN reaction occurring within the last 10 years: no (5 or 83 yrs old) If all of the above answers are "NO", then may proceed with Cephalosporin use.    Baclofen Other (See Comments)    Other reaction(s): Unknown   Celecoxib Other (See Comments)    Other reaction(s): Unknown   Doxycycline Other (See Comments)   Levofloxacin Other (See Comments)    Other reaction(s): abnl taste   Penicillin G Benzathine Other (See Comments)   Pneumococcal Vac Polyvalent Other (See Comments)    Other reaction(s): local reaction   Sulfa Antibiotics Other (See Comments)    Unknown childhood reaction   Sulfamethoxazole Other (See Comments)   Tetanus Toxoid, Adsorbed Other (See Comments)    Other reaction(s): local reaction    Patient Measurements: Height: 5\' 3"  (160 cm) Weight: 81.4 kg (179 lb 6.4 oz) IBW/kg (Calculated) : 52.4 Heparin Dosing Weight: 70 kg  Vital Signs: Temp: 97.9 F (36.6 C) (03/19 1927) Temp Source: Oral (03/19 1927) BP: 150/81 (03/19 1927) Pulse Rate: 80 (03/19 1927)  Labs: Recent Labs    07/15/23 0231 07/15/23 0430 07/15/23 0810 07/15/23 1012 07/15/23 1227 07/15/23 2022  HGB 12.0  --   --   --   --  12.1  HCT 36.4  --   --   --   --  37.1  PLT 149*  --   --   --   --  140*  HEPARINUNFRC  --   --   --   --   --  0.44  CREATININE 1.49*  --   --  1.28*  --   --   TROPONINIHS 23*    < > 175* 169* 158*  --    < > = values in this interval not displayed.    Estimated Creatinine Clearance: 33.6 mL/min (A) (by C-G formula based on SCr of 1.28 mg/dL (H)).   Medical History: Past Medical History:  Diagnosis Date   Anemia    Arthritis    osteoarthritis Shoulder,neck   Bell's palsy 1980s   CHF (congestive heart failure) (HCC) 06/2019   Chronic kidney disease    Stage III   Dyspnea 06/2019   On exertion   GERD (gastroesophageal reflux disease)    Hemochromatosis    History of hiatal hernia    Hypertension    Iron deficiency anemia due to chronic blood loss 03/10/2017   PE (pulmonary embolism) 2007   x2   Peripheral vascular disease (HCC)    DVT Rt leg   Pneumonia 1990s   "walking" pneumonia   Prothrombin gene mutation (HCC) 05/30/2013   Restless legs    Right leg DVT (HCC) 05/30/2013   Stroke (HCC)    found on a MRI, she's not aware otherwise    Assessment: 83 yoF presenting with chest pain. No oral anticoagulation reported prior to admission, but previously took Xarelto for chronic DVT/PE. Patient found to be in atrial fibrillation. Pharmacy consulted to manage  heparin infusion.   Initial heparin level 0.44, therapeutic on 1000 units/hr.  No issues noted.  Goal of Therapy:  Heparin level 0.3-0.7 units/ml Monitor platelets by anticoagulation protocol: Yes   Plan:  Continue heparin infusion at 1000 units/hr Check anti-Xa level, CBC daily while on heparin Continue to monitor H&H and platelets  Trixie Rude, PharmD Clinical Pharmacist 07/15/2023  9:04 PM

## 2023-07-15 NOTE — H&P (Signed)
 Jodi Bollman, MD Physician Cardiology   Consult Note    Addendum   Date of Service: 07/15/2023  9:14 AM   Expand All Collapse All      Cardiology H&P    Patient ID: [name] MRN: [id number]; DOB: 27-Oct-1940   Admit date: 07/15/2023 Date of Consult: 07/15/2023   PCP:  Noberto Retort, MD              [facility] Providers Cardiologist:  Nanetta Batty, MD    Patient Profile:    [name] is a 83 y.o. female with a hx of history of chronic DVT/PE no longer on Xarelto with IVC filter placed 02/2020 and removed 05/2020, LBBB, CVA, IDDA, CKD 3, thrombophilia with prothrombin gene mutation, nonobstructive CAD, NICM, hypertension, hyperlipidemia, and family history of CAD who is being seen 07/15/2023 for the evaluation of chest pain at the request of Dr. Madilyn Hook.   History of Present Illness:    [name] underwent echocardiogram 05/2019 that showed an LVEF 45-50% with mild asymmetric LVH, grade 1 DD, mild MR.  Nuclear stress test 06/2019 showed an EF of 29% and concern for a small defect in the mid anteroseptal and apical septal location.  Subsequent left and right heart catheterization 06/2019 showed 40 to 50% proximal LAD lesion that was nonobstructive, no PCI.  LVEDP was 25 mmHg with mildly elevated right heart pressures and a PASP 37 mmHg.  Medical management was recommended for LV dysfunction felt to be related to nonischemic cardiomyopathy.  She had an IVC filter placed on 03/12/2020 by [facility] prior to her right total knee arthroplasty, IVC filter was removed 05/2020.  Echocardiogram September 2023 showed an LVEF 40-45% with mild to moderate MR, grade 2 DD with global hypokinesis.   Due to unilateral leg swelling, UV Dopplers 09/2022 showed chronic right lower extremity DVT unchanged from prior studies in 2016, 2017, and 2019.  She follows with hematology for decisions regarding anticoagulation.  She was last seen in clinic 03/2023 and was ambulating with a walker.   She has chronic swelling of her left lower extremity and chronic mild dyspnea.   She has been maintained on ASA, 40 mg lipitor, 80 mg lasix pAM and 40 mg pPM, 50 mg losartan, toprol 25 mg.   She presented to [facility] 07/15/2023 with chest pain and rapid heartbeat.  Given history of PE/DVT, CTA was obtained and ruled out PE.  Chest pain started at 10 PM last evening described as a tight and dull sensation across her central chest.  She does report diaphoresis and shortness of breath but no nausea or vomiting.  She also started her CPAP therapy 3 weeks ago for sleep apnea.   On arrival, HR 150, BP 128/101, 95% on room air BNP 565 HS troponin 23 --> 79 --> 175 EKG with HR 141, LBBB (old) irregular pattern suggestive of Afib Subsequent tracings with NSR and LBBB   sCr 1.49 - baseline appears 1.3-1.6   Cardiology was consulted for concern for unstable angina and recommended ER to ER transfer.    She reports taking Lasix 80 mg every morning.  She is struggling to get used to her CPAP.  She went to a restaurant and ate pizza on Monday evening and has subsequently noted increased swelling in her legs.  She has a chronic shortness of breath.  She is fairly sedentary and ambulates with a walker in the house, has not had recent chest discomfort.  At  approximately 10 PM Tuesday evening, she experienced onset of 2-3 out of 10 chest discomfort that felt like a pressure.  There is question of this radiated to her left arm as she had had left arm pain the day before.  This was associated with diaphoresis, exacerbated by her trying to take off her CPAP in order to come to the ER.  Chest pain persisted for "several hours."  She received 324 mg aspirin at [facility] but no SL NTG.  She is unsure what made the chest pain stop, she is currently chest pain-free.  She states that she has chronic pain from arthritis but knew this chest discomfort was different.  She is currently resting comfortably,  husband at bedside.       Past Medical History:  Diagnosis Date   Anemia     Arthritis      osteoarthritis Shoulder,neck   Bell's palsy 1980s   CHF (congestive heart failure) (HCC) 06/2019   Chronic kidney disease      Stage III   Dyspnea 06/2019    On exertion   GERD (gastroesophageal reflux disease)     Hemochromatosis     History of hiatal hernia     Hypertension     Iron deficiency anemia due to chronic blood loss 03/10/2017   PE (pulmonary embolism) 2007    x2   Peripheral vascular disease (HCC)      DVT Rt leg   Pneumonia 1990s    "walking" pneumonia   Prothrombin gene mutation (HCC) 05/30/2013   Restless legs     Right leg DVT (HCC) 05/30/2013   Stroke (HCC)      found on a MRI, she's not aware otherwise               Past Surgical History:  Procedure Laterality Date   BACK SURGERY       CARDIAC CATHETERIZATION Bilateral 2005   COLONOSCOPY       IVC FILTER INSERTION N/A 03/12/2020    Procedure: IVC FILTER INSERTION;  Surgeon: Maeola Harman, MD;  Location: [facility] INVASIVE CV LAB;  Service: Cardiovascular;  Laterality: N/A;   IVC FILTER REMOVAL N/A 06/11/2020    Procedure: IVC FILTER REMOVAL;  Surgeon: Maeola Harman, MD;  Location: [facility] INVASIVE CV LAB;  Service: Cardiovascular;  Laterality: N/A;   REVERSE SHOULDER ARTHROPLASTY Right 03/07/2021    Procedure: REVERSE SHOULDER ARTHROPLASTY;  Surgeon: Francena Hanly, MD;  Location: [facility] ORS;  Service: Orthopedics;  Laterality: Right;    RIGHT/LEFT HEART CATH AND CORONARY ANGIOGRAPHY N/A 07/12/2019    Procedure: RIGHT/LEFT HEART CATH AND CORONARY ANGIOGRAPHY;  Surgeon: Lennette Bihari, MD;  Location: [facility] INVASIVE CV LAB;  Service: Cardiovascular;  Laterality: N/A;   SHOULDER SURGERY Bilateral     TOTAL KNEE ARTHROPLASTY Right 03/16/2020    Procedure: TOTAL KNEE ARTHROPLASTY;  Surgeon: Jene Every, MD;  Location: [facility] ORS;  Service: Orthopedics;  Laterality: Right;  2.5 hrs          Home  Medications:         Prior to Admission medications   Medication Sig Start Date End Date Taking? Authorizing Provider  acetaminophen (TYLENOL) 500 MG tablet Take 1,000 mg by mouth every 6 (six) hours as needed. 01/08/23     [provider]  allopurinol (ZYLOPRIM) 300 MG tablet Take 300 mg by mouth in the morning. 11/12/20     [provider]  aspirin EC 81 MG tablet Take 162 mg  by mouth in the morning. Swallow whole.       [provider]  atorvastatin (LIPITOR) 40 MG tablet Take 1 tablet (40 mg total) by mouth daily at 6 PM. 07/13/19     Meredeth Ide, MD  Biotin 1000 MCG tablet Take 1,000 mcg by mouth in the morning.       [provider]  Calcium Carb-Cholecalciferol (CALCIUM 600+D3 PO) Take 1 tablet by mouth in the morning and at bedtime.       [provider]  cholecalciferol (VITAMIN D) 25 MCG (1000 UNIT) tablet Take 1,000 Units by mouth in the morning and at bedtime.       [provider]  clindamycin (CLEOCIN) 150 MG capsule Take 4 capsules by mouth. 4 tablets prior to dental procedures Patient not taking: Reported on 01/13/2023       [provider]  colchicine 0.6 MG tablet Take 0.6 mg by mouth daily. PRN       [provider]  cycloSPORINE (RESTASIS) 0.05 % ophthalmic emulsion Place 1 drop into both eyes 2 (two) times daily.       [provider]  ferrous sulfate 325 (65 FE) MG EC tablet Take 325 mg by mouth in the morning.       [provider]  furosemide (LASIX) 80 MG tablet Take 1 tablet (80 mg total) by mouth daily. 06/26/23     Runell Gess, MD  Hypromellose (ARTIFICIAL TEARS OP) Place 1 drop into both eyes 3 (three) times daily as needed (dry eyes).       [provider]  losartan (COZAAR) 50 MG tablet Take 1 tablet (50 mg total) by mouth daily. 07/13/19     Meredeth Ide, MD  metoprolol succinate (TOPROL-XL) 25 MG 24 hr tablet Take 1 tablet by mouth once daily 12/01/22     Runell Gess, MD  Multiple Vitamin (MULTIVITAMIN WITH MINERALS) TABS tablet Take 1 tablet by mouth in the morning. Centrum Silver (NO IRON)       [provider]  Omega-3 Fatty Acids (FISH OIL PO) Take 1 capsule by mouth in the morning.       [provider]  omeprazole (PRILOSEC) 20 MG capsule Take 20 mg by mouth daily. In PM and 40 mg in AM 07/04/19     [provider]  omeprazole (PRILOSEC) 40 MG capsule Take 40 mg by mouth daily. In AM and 20 mg in PM 01/05/23     [provider]  oxyCODONE (ROXICODONE) 5 MG immediate release tablet Take 1 tablet (5 mg total) by mouth every 6 (six) hours as needed for severe pain (pain score 7-10). 03/06/23     Lonell Grandchild, MD  polycarbophil (FIBERCON) 625 MG tablet Take 625 mg by mouth in the morning and at bedtime.       [provider]  potassium chloride SA (KLOR-CON M) 20 MEQ tablet Take with furosemide on Friday and Saturday for two doses. Patient not taking: Reported on 04/08/2023 08/29/21     Ronney Asters, NP  pramipexole (MIRAPEX) 0.5 MG tablet Take 1 mg by mouth daily.       [provider]  temazepam (RESTORIL) 15 MG capsule Take 1 capsule (15 mg total) by mouth at bedtime. 12/02/16     Love, Evlyn Kanner, PA-C  Turmeric Curcumin 500 MG CAPS Take 500 mg by mouth at bedtime.        [provider]  vitamin E 180 MG (400 UNITS) capsule Take 400 Units by mouth at bedtime.       [provider]      Inpatient Medications: Scheduled Meds:       Continuous Infusions:       PRN Meds:         Allergies:    Allergies       Allergies  Allergen Reactions   Morphine And Codeine Other (See Comments)      LARGER DOSES OF MORPHINE CAUSES BODY TWITCHING   Penicillins Anaphylaxis      Childhood reaction Has patient had a PCN reaction causing immediate rash, facial/tongue/throat swelling, SOB or lightheadedness with hypotension: Yes Has patient had a PCN reaction causing severe  rash involving mucus membranes or skin necrosis: Unknown Has patient had a PCN reaction that required hospitalization: No Has patient had a PCN reaction occurring within the last 10 years: no (5 or 83 yrs old) If all of the above answers are "NO", then may proceed with Cephalosporin use.     Baclofen Other (See Comments)      Other reaction(s): Unknown   Celecoxib Other (See Comments)      Other reaction(s): Unknown   Doxycycline Other (See Comments)   Levofloxacin Other (See Comments)      Other reaction(s): abnl taste   Penicillin G Benzathine Other (See Comments)   Pneumococcal Vac Polyvalent Other (See Comments)      Other reaction(s): local reaction   Sulfa Antibiotics Other (See Comments)      Unknown childhood reaction   Sulfamethoxazole Other (See Comments)   Tetanus Toxoid, Adsorbed Other (See Comments)      Other reaction(s): local reaction        Social History:   Social History         Socioeconomic History   Marital status: Married      Spouse name: Not on file   Number of children: Not on file   Years of education: Not on file   Highest education level: Not on file  Occupational History   Not on file  Tobacco Use   Smoking status: Never   Smokeless tobacco: Never   Tobacco comments:      never used tobacco  Vaping Use   Vaping status: Never Used  Substance and Sexual Activity   Alcohol use: No      Alcohol/week: 0.0 standard drinks of alcohol   Drug use: No   Sexual activity: Not Currently  Other Topics Concern   Not on file  Social History Narrative   Not on file    Social Drivers of Health        Financial Resource Strain: Not on file  Food Insecurity: Low Risk  (12/09/2022)    Received from [facility]    Hunger Vital Sign     Worried About Running Out of Food in the Last Year: Never true     Ran Out of Food in the Last Year: Never true  Transportation Needs: Not on file (12/09/2022)  Physical Activity: Not on file  Stress: Not on file   Social Connections: Unknown (06/25/2022)    Received from [facility]    Social Network     Social Network: Not on file  Intimate Partner Violence: Unknown (06/25/2022)    Received from [facility], [facility]    HITS     Physically Hurt: Not on file     Insult or Talk Down To:  Not on file     Threaten Physical Harm: Not on file     Scream or Curse: Not on file    Family History:          Family History  Problem Relation Age of Onset   Congestive Heart Failure Mother     Pancreatic cancer Father            ROS:  Please see the history of present illness.    All other ROS reviewed and negative.      Physical Exam/Data:          Vitals:    07/15/23 0945 07/15/23 1000 07/15/23 1015 07/15/23 1030  BP: 127/70 135/67 (!) 128/59 132/74  Pulse: 75 75 73 75  Resp: (!) 27 (!) 21 (!) 22 19  Temp:          TempSrc:          SpO2: 96% 98% 98% 97%    No intake or output data in the 24 hours ending 07/15/23 1042     04/08/2023   10:50 AM 03/06/2023    6:57 AM 01/13/2023    1:34 PM  Last 3 Weights  Weight (lbs) 181 lb 180 lb 178 lb  Weight (kg) 82.101 kg 81.647 kg 80.74 kg     There is no height or weight on file to calculate BMI.  General:  elderly female in NAD HEENT: normal Neck: + JVD Vascular: No carotid bruits; Distal pulses 2+ bilaterally Cardiac:  normal S1, S2; RRR; no murmur  Lungs:  clear to auscultation bilaterally, no wheezing, rhonchi or rales  Abd: soft, nontender, no hepatomegaly  Ext: mild B LE edema Musculoskeletal:  No deformities, BUE and BLE strength normal and equal Skin: warm and dry  Neuro:  CNs 2-12 intact, no focal abnormalities noted Psych:  Normal affect    EKG:  The EKG was personally reviewed and demonstrates:  SR HR 83 with LBBB Telemetry:  Telemetry was personally reviewed and demonstrates:  SR with HR 80s, PVCs   Relevant CV Studies:   Echo 09/2022: 1. Left ventricular ejection fraction, by estimation, is 40 to  45%. The  left ventricle has mildly decreased function. The left ventricle has no  regional wall motion abnormalities. Left ventricular diastolic parameters  are consistent with Grade I  diastolic dysfunction (impaired relaxation). Significant dyssynchrony due  to underlying LBBB.   2. Right ventricular systolic function is normal. The right ventricular  size is normal.   3. Left atrial size was severely dilated.   4. The mitral valve is normal in structure. Trivial mitral valve  regurgitation. No evidence of mitral stenosis.   5. The aortic valve is normal in structure. Aortic valve regurgitation is  not visualized. No aortic stenosis is present.   6. The inferior vena cava is normal in size with greater than 50%  respiratory variability, suggesting right atrial pressure of 3 mmHg.    Laboratory Data:   High Sensitivity Troponin:   Last Labs       Recent Labs  Lab 07/15/23 0231 07/15/23 0430 07/15/23 0810  TROPONINIHS 23* 79* 175*       Chemistry Last Labs     Recent Labs  Lab 07/15/23 0231  NA 138  K 4.3  CL 103  CO2 27  GLUCOSE 110*  BUN 44*  CREATININE 1.49*  CALCIUM 9.4  GFRNONAA 35*  ANIONGAP 8      Last Labs  No results for input(s): "PROT", "ALBUMIN", "  AST", "ALT", "ALKPHOS", "BILITOT" in the last 168 hours.   Lipids  Last Labs  No results for input(s): "CHOL", "TRIG", "HDL", "LABVLDL", "LDLCALC", "CHOLHDL" in the last 168 hours.    Hematology Last Labs     Recent Labs  Lab 07/15/23 0231  WBC 5.2  RBC 3.98  HGB 12.0  HCT 36.4  MCV 91.5  MCH 30.2  MCHC 33.0  RDW 14.9  PLT 149*      Thyroid  Last Labs  No results for input(s): "TSH", "FREET4" in the last 168 hours.    BNP Last Labs     Recent Labs  Lab 07/15/23 0249  BNP 564.7*      DDimer  Last Labs  No results for input(s): "DDIMER" in the last 168 hours.       Radiology/Studies:  CT Angio Chest PE W/Cm &/Or Wo Cm Result Date: 07/15/2023 CLINICAL DATA:  Chest pain,  shortness of breath, left arm pain, history of CHF EXAM: CT ANGIOGRAPHY CHEST WITH CONTRAST TECHNIQUE: Multidetector CT imaging of the chest was performed using the standard protocol during bolus administration of intravenous contrast. Multiplanar CT image reconstructions and MIPs were obtained to evaluate the vascular anatomy. RADIATION DOSE REDUCTION: This exam was performed according to the departmental dose-optimization program which includes automated exposure control, adjustment of the mA and/or kV according to patient size and/or use of iterative reconstruction technique. CONTRAST:  60mL OMNIPAQUE IOHEXOL 350 MG/ML SOLN COMPARISON:  Same day chest radiograph and CT chest 03/06/2023 FINDINGS: Cardiovascular: Mild cardiomegaly. No pericardial effusion. Normal caliber thoracic aorta. Aortic and coronary artery atherosclerotic calcification. Negative for pulmonary embolism. Mediastinum/Nodes: Moderate hiatal hernia. Trachea is unremarkable. No thoracic adenopathy. Lungs/Pleura: No focal consolidation, pleural effusion, or pneumothorax. Scarring in the lower lobes. Upper Abdomen: No acute abnormality. Musculoskeletal: Postoperative change right shoulder. No acute fracture. Chronic posterior left rib fractures. Review of the MIP images confirms the above findings. IMPRESSION: 1. Negative for pulmonary embolism. 2. Moderate hiatal hernia. 3. Aortic Atherosclerosis (ICD10-I70.0). Electronically Signed   By: Minerva Fester M.D.   On: 07/15/2023 03:37    DG Chest 2 View Result Date: 07/15/2023 CLINICAL DATA:  Chest pain with shortness of breath and left arm pain. EXAM: CHEST - 2 VIEW COMPARISON:  Sep 05, 2022 FINDINGS: The heart size and mediastinal contours are within normal limits. Moderate to marked severity calcification of the aortic arch is noted. Both lungs are clear. There is a stable, moderate-sized hiatal hernia. A right shoulder replacement is noted. Multilevel degenerative changes are seen throughout  the thoracic spine. IMPRESSION: 1. No active cardiopulmonary disease. 2. Stable, moderate-sized hiatal hernia. Electronically Signed   By: Aram Candela M.D.   On: 07/15/2023 02:53        Assessment and Plan:    Chest pain CAD - she has known nonobstructive CAD in her LAD - echo last year with further reduction in LVEF to 40-45% - presenting with symptoms concerning for unstable angina - troponin mildly elevated, will trend an additional 2 values - will likely need an ischemic evaluation - given CKD and CTA at [facility], will draw another BMP to check renal function, hold off on fluids given hypervolemia - will obtain updated echo - if LVEF depressed and troponin significantly elevated, consider repeat left and right heart cath, timing depending on renal function and symptoms     Acute on Chronic systolic heart failure Grade 1 DD CKD stage III - LVEF 40-45% - maintained on 80 mg lasix  daily - dietary indiscretion Monday night - elevated BNP on arrival and exam consistent with hypervolemia - sCr 1.49 - still at baseline, prior to CTA - repeat BMP pending - will hold losartan - continue toprol     Afib with RVR - new diagnosis LBBB Severe LAE - initial EKG with irregular tachycardia with LBBB concerning for Afib RVR - she was unaware of tachycardia - noted to have severe LAE on echo last year - now in SR, I can't see telemetry from med [facility] - recommend starting heparin gtt - may need to consider multaq     Hyperlipidemia with LDL goal < 70 Continue 40 mg lipitor     Chest pain could be related to mild hypervolemia, unstable angina, or Afib RVR. I have ordered an additional CE x 2, BMP, and echocardiogram. If troponin significantly elevated, may need repeat R/L HC. She received contrast with CTA to rule out PE. Will await BMP and repeat echo to determine next steps. She is also volume up on exam - she is maintained on 80 mg laisx daily. Start heparin gtt.        Risk Assessment/Risk Scores:     TIMI Risk Score for Unstable Angina or Non-ST Elevation MI:   The patient's TIMI risk score is 3, which indicates a 13% risk of all cause mortality, new or recurrent myocardial infarction or need for urgent revascularization in the next 14 days.   New York Heart Association (NYHA) Functional Class NYHA Class III   CHA2DS2-VASc Score = 6   This indicates a 9.7% annual risk of stroke. The patient's score is based upon: CHF History: 1 HTN History: 1 Diabetes History: 0 Stroke History: 0 Vascular Disease History: 1 Age Score: 2 Gender Score: 1       For questions or updates, please contact  HeartCare Please consult [email or web address] for contact info under      Signed, Marcelino Duster, [place]  07/15/2023 10:42 AM   Patient seen, examined. Available data reviewed. Agree with findings, assessment, and plan as outlined by Bettina Gavia, PA.  The patient is independently interviewed and examined.  She is an elderly woman in no distress.  HEENT is normal, JVP is mildly elevated, carotid upstrokes normal without bruits, lungs are clear bilaterally, heart is regular rate and rhythm with no murmur gallop, abdomen is soft and nontender with no masses, extremities have no edema.  EKGs were reviewed and she initially is shown to be in atrial fibrillation with RVR with ventricular rate of 141 bpm and left bundle branch block.  Her follow-up EKG demonstrates normal sinus rhythm 83 bpm with left bundle branch block.  I think her presentation is consistent with symptomatic atrial fibrillation with RVR and demand ischemia with a mild elevation of high-sensitivity troponin, troponin trend 23--->79--->175--->169.  Chest CTA negative for PE. Baseline echo from 2024 with moderate LV dysfunction and LVEF 40-45% with global LV hypokinesis. Recommend initial treatment with unfractionated heparin, consider early conversion to a direct oral anticoagulant drug as long if she  does not require any procedures.  Her sinus rate is in the 50's and I don't think she will tolerate AV nodal blockade. Dronedarone could be considered for AAD Rx if recurrent AFib. Await 2D echocardiogram.  As long as she does not have marked change in LVEF and/or regional wall motion abnormalities, I am not inclined to pursue cardiac catheterization as she again has had only low-level troponin elevation in the setting of known nonobstructive  CAD from cardiac catheterization in 2021.  If no significant clinical changes overnight, consider hospital DC tomorrow with an outpatient ZIO to assess AF burden. Otherwise, as outlined above. Plan reviewed in detail with the patient and her husband who is present at the bedside.    [name], M.D. 07/15/2023 12:39 PM

## 2023-07-15 NOTE — ED Provider Notes (Signed)
 [facility] Provider Note   CSN: [id number] Arrival date & time: 07/15/23  0208     History  Chief Complaint  Patient presents with   Tachycardia   Chest Pain    [name] is a 83 y.o. female.  The history is provided by the patient.  Chest Pain [name] is a 83 y.o. female who presents to the Emergency Department complaining of chest pain.  She presents to the emergency department for evaluation of chest pain that started around 10 PM.  Pain is in her central chest and is described as a tight and dull sensation.  She had associated diaphoresis, difficulty breathing.  No associated nausea or vomiting.  She did start CPAP 3 weeks ago for sleep apnea.  She does have a history of recurrent DVT/PE.  She currently takes 2 baby aspirin daily and is no longer on anticoagulation.  She also has a history of CHF.  She does report lower extremity edema that she attributes to eating pizza on Monday.   Home Medications Prior to Admission medications   Medication Sig Start Date End Date Taking? Authorizing Provider  acetaminophen (TYLENOL) 500 MG tablet Take 1,000 mg by mouth every 6 (six) hours as needed. 01/08/23   [provider]  allopurinol (ZYLOPRIM) 300 MG tablet Take 300 mg by mouth in the morning. 11/12/20   [provider]  aspirin EC 81 MG tablet Take 162 mg by mouth in the morning. Swallow whole.    [provider]  atorvastatin (LIPITOR) 40 MG tablet Take 1 tablet (40 mg total) by mouth daily at 6 PM. 07/13/19   Meredeth Ide, MD  Biotin 1000 MCG tablet Take 1,000 mcg by mouth in the morning.    [provider]  Calcium Carb-Cholecalciferol (CALCIUM 600+D3 PO) Take 1 tablet by mouth in the morning and at bedtime.    [provider]  cholecalciferol (VITAMIN D) 25 MCG (1000 UNIT) tablet Take 1,000 Units by mouth in the morning and at bedtime.    [provider]  clindamycin  (CLEOCIN) 150 MG capsule Take 4 capsules by mouth. 4 tablets prior to dental procedures Patient not taking: Reported on 01/13/2023    [provider]  colchicine 0.6 MG tablet Take 0.6 mg by mouth daily. PRN    [provider]  cycloSPORINE (RESTASIS) 0.05 % ophthalmic emulsion Place 1 drop into both eyes 2 (two) times daily.    [provider]  ferrous sulfate 325 (65 FE) MG EC tablet Take 325 mg by mouth in the morning.    [provider]  furosemide (LASIX) 80 MG tablet Take 1 tablet (80 mg total) by mouth daily. 06/26/23   Runell Gess, MD  Hypromellose (ARTIFICIAL TEARS OP) Place 1 drop into both eyes 3 (three) times daily as needed (dry eyes).    [provider]  losartan (COZAAR) 50 MG tablet Take 1 tablet (50 mg total) by mouth daily. 07/13/19   Meredeth Ide, MD  metoprolol succinate (TOPROL-XL) 25 MG 24 hr tablet Take 1 tablet by mouth once daily 12/01/22   Runell Gess, MD  Multiple Vitamin (MULTIVITAMIN WITH MINERALS) TABS tablet Take 1 tablet by mouth in the morning. Centrum Silver (NO IRON)    [provider]  Omega-3 Fatty Acids (FISH OIL PO) Take 1 capsule by mouth in the morning.    [provider]  omeprazole (PRILOSEC) 20 MG capsule Take  20 mg by mouth daily. In PM and 40 mg in AM 07/04/19   [provider]  omeprazole (PRILOSEC) 40 MG capsule Take 40 mg by mouth daily. In AM and 20 mg in PM 01/05/23   [provider]  oxyCODONE (ROXICODONE) 5 MG immediate release tablet Take 1 tablet (5 mg total) by mouth every 6 (six) hours as needed for severe pain (pain score 7-10). 03/06/23   Lonell Grandchild, MD  polycarbophil (FIBERCON) 625 MG tablet Take 625 mg by mouth in the morning and at bedtime.    [provider]  potassium chloride SA (KLOR-CON M) 20 MEQ tablet Take with furosemide on Friday and Saturday for two doses. Patient not taking: Reported on 04/08/2023 08/29/21   Ronney Asters, NP   pramipexole (MIRAPEX) 0.5 MG tablet Take 1 mg by mouth daily.    [provider]  temazepam (RESTORIL) 15 MG capsule Take 1 capsule (15 mg total) by mouth at bedtime. 12/02/16   Love, Evlyn Kanner, PA-C  Turmeric Curcumin 500 MG CAPS Take 500 mg by mouth at bedtime.     [provider]  vitamin E 180 MG (400 UNITS) capsule Take 400 Units by mouth at bedtime.    [provider]      Allergies    Morphine and codeine; Penicillins; Baclofen; Celecoxib; Doxycycline; Levofloxacin; Penicillin g benzathine; Pneumococcal vac polyvalent; Sulfa antibiotics; Sulfamethoxazole; and Tetanus toxoid, adsorbed    Review of Systems   Review of Systems  Cardiovascular:  Positive for chest pain.  All other systems reviewed and are negative.   Physical Exam Updated Vital Signs BP 129/72   Pulse 83   Temp 98.3 F (36.8 C) (Oral)   Resp 18   SpO2 97%  Physical Exam Vitals and nursing note reviewed.  Constitutional:      Appearance: She is well-developed.  HENT:     Head: Normocephalic and atraumatic.  Cardiovascular:     Rate and Rhythm: Tachycardia present. Rhythm irregular.     Heart sounds: No murmur heard. Pulmonary:     Effort: Pulmonary effort is normal. No respiratory distress.     Breath sounds: Normal breath sounds.  Abdominal:     Palpations: Abdomen is soft.     Tenderness: There is no abdominal tenderness. There is no guarding or rebound.  Musculoskeletal:        General: No tenderness.     Comments: Edema to BLE, left greater than right.   Skin:    General: Skin is warm and dry.  Neurological:     Mental Status: She is alert and oriented to person, place, and time.  Psychiatric:        Behavior: Behavior normal.     ED Results / Procedures / Treatments   Labs (all labs ordered are listed, but only abnormal results are displayed) Labs Reviewed  BASIC METABOLIC PANEL - Abnormal; Notable for the following components:      Result Value   Glucose, Bld  110 (*)    BUN 44 (*)    Creatinine, Ser 1.49 (*)    GFR, Estimated 35 (*)    All other components within normal limits  CBC - Abnormal; Notable for the following components:   Platelets 149 (*)    All other components within normal limits  BRAIN NATRIURETIC PEPTIDE - Abnormal; Notable for the following components:   B Natriuretic Peptide 564.7 (*)    All other components within normal limits  TROPONIN I (HIGH  SENSITIVITY) - Abnormal; Notable for the following components:   Troponin I (High Sensitivity) 23 (*)    All other components within normal limits  TROPONIN I (HIGH SENSITIVITY) - Abnormal; Notable for the following components:   Troponin I (High Sensitivity) 79 (*)    All other components within normal limits    EKG EKG Interpretation Date/Time:  Wednesday July 15 2023 02:17:18 EDT Ventricular Rate:  141 PR Interval:    QRS Duration:  151 QT Interval:  339 QTC Calculation: 520 R Axis:   -79  Text Interpretation: irregular wide complex tachycardia Confirmed by Tilden Fossa [phone]) on 07/15/2023 2:27:03 AM  Radiology CT Angio Chest PE W/Cm &/Or Wo Cm Result Date: 07/15/2023 CLINICAL DATA:  Chest pain, shortness of breath, left arm pain, history of CHF EXAM: CT ANGIOGRAPHY CHEST WITH CONTRAST TECHNIQUE: Multidetector CT imaging of the chest was performed using the standard protocol during bolus administration of intravenous contrast. Multiplanar CT image reconstructions and MIPs were obtained to evaluate the vascular anatomy. RADIATION DOSE REDUCTION: This exam was performed according to the departmental dose-optimization program which includes automated exposure control, adjustment of the mA and/or kV according to patient size and/or use of iterative reconstruction technique. CONTRAST:  60mL OMNIPAQUE IOHEXOL 350 MG/ML SOLN COMPARISON:  Same day chest radiograph and CT chest 03/06/2023 FINDINGS: Cardiovascular: Mild cardiomegaly. No pericardial effusion. Normal caliber  thoracic aorta. Aortic and coronary artery atherosclerotic calcification. Negative for pulmonary embolism. Mediastinum/Nodes: Moderate hiatal hernia. Trachea is unremarkable. No thoracic adenopathy. Lungs/Pleura: No focal consolidation, pleural effusion, or pneumothorax. Scarring in the lower lobes. Upper Abdomen: No acute abnormality. Musculoskeletal: Postoperative change right shoulder. No acute fracture. Chronic posterior left rib fractures. Review of the MIP images confirms the above findings. IMPRESSION: 1. Negative for pulmonary embolism. 2. Moderate hiatal hernia. 3. Aortic Atherosclerosis (ICD10-I70.0). Electronically Signed   By: Minerva Fester M.D.   On: 07/15/2023 03:37   DG Chest 2 View Result Date: 07/15/2023 CLINICAL DATA:  Chest pain with shortness of breath and left arm pain. EXAM: CHEST - 2 VIEW COMPARISON:  Sep 05, 2022 FINDINGS: The heart size and mediastinal contours are within normal limits. Moderate to marked severity calcification of the aortic arch is noted. Both lungs are clear. There is a stable, moderate-sized hiatal hernia. A right shoulder replacement is noted. Multilevel degenerative changes are seen throughout the thoracic spine. IMPRESSION: 1. No active cardiopulmonary disease. 2. Stable, moderate-sized hiatal hernia. Electronically Signed   By: Aram Candela M.D.   On: 07/15/2023 02:53    Procedures Procedures    Medications Ordered in ED Medications  aspirin chewable tablet 324 mg (has no administration in time range)  iohexol (OMNIPAQUE) 350 MG/ML injection 60 mL (60 mLs Intravenous Contrast Given 07/15/23 0319)    ED Course/ Medical Decision Making/ A&P                                 Medical Decision Making Amount and/or Complexity of Data Reviewed Labs: ordered. Radiology: ordered.  Risk OTC drugs. Prescription drug management.   Patient with history of CHF, DVT/PE here for evaluation of chest pain and diaphoresis.  Patient tachycardic at time  of ED arrival, EKG consistent with new onset atrial fibrillation with RVR.  She did have rapid conversion to sinus rhythm without intervention when she was moved to a new monitor.  She did still have some ongoing light chest discomfort, this did resolve during her  ED stay.  Initial troponin is minimally elevated, repeat troponin did increase to 79.  BMP with stable renal insufficiency.  BNP is mildly elevated.  Given her history of PE a CTA PE study was obtained, which is negative for acute PE.  Discussed with Dr. Cherly Beach with cardiology patient's symptoms and presentation.  Recommendation for ED to ED transfer for cardiology evaluation.  Discussed with Dr. Clayborne Dana in the [facility] emergency department, who accepts the patient in transfer.  Patient and husband updated of findings of studies and they are in agreement with plan.        Final Clinical Impression(s) / ED Diagnoses Final diagnoses:  None    Rx / DC Orders ED Discharge Orders     None         Tilden Fossa, MD 07/15/23 [id number]

## 2023-07-15 NOTE — Progress Notes (Signed)
 Phlebotomist called and informed about timed heparin level labs. They will be on the floor to collect blood shortly.

## 2023-07-15 NOTE — ED Notes (Signed)
 Pt alert, NAD, calm, interactive, resps e/u, speaking in clear complete sentences, VSS, skin W&D.

## 2023-07-15 NOTE — ED Notes (Signed)
 Called [facility] for transport to ED to [facility] , No Current ETA ED Nurse aware Called @ 06:04 am

## 2023-07-15 NOTE — ED Triage Notes (Signed)
 Pt arrives w/ c/o chest pain, SHOB and left arm pain since this afternoon. Tachycardic in triage. No hx of AFIB.

## 2023-07-15 NOTE — Progress Notes (Addendum)
 Dr. Earmon Phoenix requested admit orders - orders written per his recommendations. Patient confirmed she is full code. She did indicate she wanted to speak with daughter further as well and would keep [place] apprised if there is any change in status. He recommended to allow to eat in AM but keep NPO lunch onward to help guide rec for cath. I sent msg to [employer] inbox to review plan in AM. Also sent request to pharm team to verify home med rec since this is not yet verified. Per MD recommentation, hold Lasix/losartan and anticipate continuing beta blocker when last dose timing/mg is confirmed.

## 2023-07-15 NOTE — ED Notes (Signed)
 EDP at [facility]

## 2023-07-15 NOTE — Progress Notes (Signed)
  2D Echocardiogram has been performed.  [name] 07/15/2023, 3:18 PM

## 2023-07-16 ENCOUNTER — Other Ambulatory Visit (HOSPITAL_COMMUNITY): Payer: Self-pay

## 2023-07-16 ENCOUNTER — Inpatient Hospital Stay (HOSPITAL_COMMUNITY)
Admission: EM | Disposition: A | Discharge status: Home / Self Care | Payer: Self-pay | Source: Home / Self Care | Attending: Cardiovascular Disease

## 2023-07-16 ENCOUNTER — Encounter: Payer: Self-pay | Admitting: Hematology & Oncology

## 2023-07-16 ENCOUNTER — Telehealth (HOSPITAL_COMMUNITY): Payer: Self-pay | Admitting: Pharmacy Technician

## 2023-07-16 DIAGNOSIS — I2510 Atherosclerotic heart disease of native coronary artery without angina pectoris: Secondary | ICD-10-CM | POA: Diagnosis not present

## 2023-07-16 DIAGNOSIS — R7989 Other specified abnormal findings of blood chemistry: Secondary | ICD-10-CM

## 2023-07-16 DIAGNOSIS — I5021 Acute systolic (congestive) heart failure: Secondary | ICD-10-CM

## 2023-07-16 DIAGNOSIS — E785 Hyperlipidemia, unspecified: Secondary | ICD-10-CM | POA: Diagnosis not present

## 2023-07-16 DIAGNOSIS — I4891 Unspecified atrial fibrillation: Secondary | ICD-10-CM | POA: Diagnosis not present

## 2023-07-16 HISTORY — PX: RIGHT/LEFT HEART CATH AND CORONARY ANGIOGRAPHY: CATH118266

## 2023-07-16 LAB — COMPREHENSIVE METABOLIC PANEL
ALT: 30 U/L (ref 0–44)
AST: 38 U/L (ref 15–41)
Albumin: 3.1 g/dL — ABNORMAL LOW (ref 3.5–5.0)
Alkaline Phosphatase: 81 U/L (ref 38–126)
Anion gap: 5 (ref 5–15)
BUN: 33 mg/dL — ABNORMAL HIGH (ref 8–23)
CO2: 27 mmol/L (ref 22–32)
Calcium: 9.1 mg/dL (ref 8.9–10.3)
Chloride: 106 mmol/L (ref 98–111)
Creatinine, Ser: 1.24 mg/dL — ABNORMAL HIGH (ref 0.44–1.00)
GFR, Estimated: 43 mL/min — ABNORMAL LOW (ref >60)
Glucose, Bld: 78 mg/dL (ref 70–99)
Potassium: 3.9 mmol/L (ref 3.5–5.1)
Sodium: 138 mmol/L (ref 135–145)
Total Bilirubin: 0.7 mg/dL (ref 0.0–1.2)
Total Protein: 5.9 g/dL — ABNORMAL LOW (ref 6.5–8.1)

## 2023-07-16 LAB — POCT I-STAT 7, (LYTES, BLD GAS, ICA,H+H)
Acid-Base Excess: 0 mmol/L (ref 0.0–2.0)
Bicarbonate: 25.1 mmol/L (ref 20.0–28.0)
Calcium, Ion: 1.14 mmol/L — ABNORMAL LOW (ref 1.15–1.40)
HCT: 33 % — ABNORMAL LOW (ref 36.0–46.0)
Hemoglobin: 11.2 g/dL — ABNORMAL LOW (ref 12.0–15.0)
O2 Saturation: 89 %
Potassium: 3.6 mmol/L (ref 3.5–5.1)
Sodium: 142 mmol/L (ref 135–145)
TCO2: 26 mmol/L (ref 22–32)
pCO2 arterial: 41.3 mmHg (ref 32–48)
pH, Arterial: 7.392 (ref 7.35–7.45)
pO2, Arterial: 57 mmHg — ABNORMAL LOW (ref 83–108)

## 2023-07-16 LAB — POCT I-STAT EG7
Acid-Base Excess: 2 mmol/L (ref 0.0–2.0)
Bicarbonate: 27.9 mmol/L (ref 20.0–28.0)
Calcium, Ion: 1.25 mmol/L (ref 1.15–1.40)
HCT: 33 % — ABNORMAL LOW (ref 36.0–46.0)
Hemoglobin: 11.2 g/dL — ABNORMAL LOW (ref 12.0–15.0)
O2 Saturation: 58 %
Potassium: 3.9 mmol/L (ref 3.5–5.1)
Sodium: 140 mmol/L (ref 135–145)
TCO2: 29 mmol/L (ref 22–32)
pCO2, Ven: 47.8 mmHg (ref 44–60)
pH, Ven: 7.374 (ref 7.25–7.43)
pO2, Ven: 31 mmHg — CL (ref 32–45)

## 2023-07-16 LAB — LIPID PANEL
Cholesterol: 118 mg/dL (ref 0–200)
HDL: 66 mg/dL (ref >40)
LDL Cholesterol: 42 mg/dL (ref 0–99)
Total CHOL/HDL Ratio: 1.8 ratio
Triglycerides: 50 mg/dL (ref <150)
VLDL: 10 mg/dL (ref 0–40)

## 2023-07-16 LAB — CBC
HCT: 35.1 % — ABNORMAL LOW (ref 36.0–46.0)
Hemoglobin: 11.2 g/dL — ABNORMAL LOW (ref 12.0–15.0)
MCH: 29.5 pg (ref 26.0–34.0)
MCHC: 31.9 g/dL (ref 30.0–36.0)
MCV: 92.4 fL (ref 80.0–100.0)
Platelets: 132 10*3/uL — ABNORMAL LOW (ref 150–400)
RBC: 3.8 MIL/uL — ABNORMAL LOW (ref 3.87–5.11)
RDW: 15 % (ref 11.5–15.5)
WBC: 4.7 10*3/uL (ref 4.0–10.5)
nRBC: 0 % (ref 0.0–0.2)

## 2023-07-16 LAB — TSH: TSH: 3.098 u[IU]/mL (ref 0.350–4.500)

## 2023-07-16 LAB — HEPARIN LEVEL (UNFRACTIONATED): Heparin Unfractionated: 0.39 [IU]/mL (ref 0.30–0.70)

## 2023-07-16 SURGERY — RIGHT/LEFT HEART CATH AND CORONARY ANGIOGRAPHY
Anesthesia: LOCAL

## 2023-07-16 MED ORDER — SODIUM CHLORIDE 0.9% FLUSH: 3.0000 mL | INTRAVENOUS | Status: DC | PRN

## 2023-07-16 MED ORDER — FENTANYL CITRATE (PF) 100 MCG/2ML IJ SOLN
INTRAMUSCULAR | Status: DC | PRN
  Administered 2023-07-16: 25 ug via INTRAVENOUS

## 2023-07-16 MED ORDER — MIDAZOLAM HCL 2 MG/2ML IJ SOLN
INTRAMUSCULAR | Status: DC | PRN
  Administered 2023-07-16: 1 mg via INTRAVENOUS

## 2023-07-16 MED ORDER — HEPARIN SODIUM (PORCINE) 1000 UNIT/ML IJ SOLN
INTRAMUSCULAR | Status: DC | PRN
  Administered 2023-07-16: 3000 [IU] via INTRAVENOUS

## 2023-07-16 MED ORDER — SODIUM CHLORIDE 0.9% FLUSH
3.0000 mL | Freq: Two times a day (BID) | INTRAVENOUS | Status: DC
  Administered 2023-07-16 – 2023-07-18 (×5): 3 mL via INTRAVENOUS

## 2023-07-16 MED ORDER — HEPARIN (PORCINE) IN NACL 1000-0.9 UT/500ML-% IV SOLN
INTRAVENOUS | Status: DC | PRN
  Administered 2023-07-16: 1000 mL via INTRA_ARTERIAL

## 2023-07-16 MED ORDER — EMPAGLIFLOZIN 10 MG PO TABS
10.0000 mg | ORAL_TABLET | Freq: Every day | ORAL | Status: DC
  Administered 2023-07-17 – 2023-07-18 (×2): 10 mg via ORAL
  Filled 2023-07-16 (×2): qty 1

## 2023-07-16 MED ORDER — LIDOCAINE HCL (PF) 1 % IJ SOLN
INTRAMUSCULAR | Status: AC
  Filled 2023-07-16: qty 30

## 2023-07-16 MED ORDER — VERAPAMIL HCL 2.5 MG/ML IV SOLN
INTRAVENOUS | Status: AC
  Filled 2023-07-16: qty 2

## 2023-07-16 MED ORDER — SODIUM CHLORIDE 0.9 % IV SOLN: 250.0000 mL | INTRAVENOUS | Status: AC | PRN

## 2023-07-16 MED ORDER — HEPARIN SODIUM (PORCINE) 1000 UNIT/ML IJ SOLN
INTRAMUSCULAR | Status: AC
Start: 2023-07-16 — End: ?
  Filled 2023-07-16: qty 10

## 2023-07-16 MED ORDER — FENTANYL CITRATE (PF) 100 MCG/2ML IJ SOLN
INTRAMUSCULAR | Status: AC
  Filled 2023-07-16: qty 2

## 2023-07-16 MED ORDER — SODIUM CHLORIDE 0.9 % IV SOLN: INTRAVENOUS | Status: DC

## 2023-07-16 MED ORDER — LIDOCAINE HCL (PF) 1 % IJ SOLN
INTRAMUSCULAR | Status: DC | PRN
  Administered 2023-07-16 (×2): 2 mL

## 2023-07-16 MED ORDER — MIDAZOLAM HCL 2 MG/2ML IJ SOLN
INTRAMUSCULAR | Status: AC
  Filled 2023-07-16: qty 2

## 2023-07-16 MED ORDER — LOSARTAN POTASSIUM 25 MG PO TABS
25.0000 mg | ORAL_TABLET | Freq: Every day | ORAL | Status: DC
  Administered 2023-07-16 – 2023-07-17 (×2): 25 mg via ORAL
  Filled 2023-07-16 (×2): qty 1

## 2023-07-16 MED ORDER — IOHEXOL 350 MG/ML SOLN
INTRAVENOUS | Status: DC | PRN
  Administered 2023-07-16: 40 mL via INTRA_ARTERIAL

## 2023-07-16 MED ORDER — HEPARIN (PORCINE) 25000 UT/250ML-% IV SOLN: 1000.0000 [IU]/h | INTRAVENOUS | Status: DC

## 2023-07-16 SURGICAL SUPPLY — 13 items
CATH INFINITI AMBI 5FR JK (CATHETERS) IMPLANT
CATH INFINITI JR4 5F (CATHETERS) IMPLANT
CATH SWAN GANZ 7F STRAIGHT (CATHETERS) IMPLANT
COVER PRB 48X5XTLSCP FOLD TPE (BAG) IMPLANT
DEVICE RAD COMP TR BAND LRG (VASCULAR PRODUCTS) IMPLANT
GLIDESHEATH SLEND SS 6F .021 (SHEATH) IMPLANT
GLIDESHEATH SLENDER 7FR .021G (SHEATH) IMPLANT
GUIDEWIRE INQWIRE 1.5J.035X260 (WIRE) IMPLANT
INQWIRE 1.5J .035X260CM (WIRE) ×1 IMPLANT
PACK CARDIAC CATHETERIZATION (CUSTOM PROCEDURE TRAY) ×1 IMPLANT
SET ATX-X65L (MISCELLANEOUS) IMPLANT
SHEATH GLIDE SLENDER 4/5FR (SHEATH) IMPLANT
WIRE EMERALD 3MM-J .025X260CM (WIRE) IMPLANT

## 2023-07-16 NOTE — H&P (View-Only) (Signed)
 Patient Name: [name] Date of Encounter: 07/16/2023 [facility] Cardiologist: Nanetta Batty, MD   Interval Summary  .    Patient reports feeling okay. She has no symptoms at rest but does still feel rather short of breath when she ambulates in her room She denies any current chest pain   Vital Signs .    Vitals:   07/16/23 0415 07/16/23 0437 07/16/23 0744 07/16/23 0900  BP: 128/75  (!) 148/79 126/73  Pulse:   77 75  Resp: 20  18 14   Temp: 98.2 F (36.8 C)  97.7 F (36.5 C) 98.1 F (36.7 C)  TempSrc: Oral  Oral Oral  SpO2: 94%  93% 95%  Weight:  81.1 kg    Height:        Intake/Output Summary (Last 24 hours) at 07/16/2023 0938 Last data filed at 07/16/2023 0849 Gross per 24 hour  Intake 167.11 ml  Output 600 ml  Net -432.89 ml      07/16/2023    4:37 AM 07/15/2023    6:51 PM 07/15/2023   12:15 PM  Last 3 Weights  Weight (lbs) 178 lb 14.4 oz 179 lb 6.4 oz 178 lb 9.2 oz  Weight (kg) 81.149 kg 81.375 kg 81 kg     Telemetry/ECG    Sinus rhythm, HR 70-80s with PVCs - Personally Reviewed  Physical Exam .   GEN: No acute distress.   Neck: No JVD Cardiac: RRR, no murmurs, rubs, or gallops.  Respiratory: Clear to auscultation bilaterally. GI: Soft, nontender, non-distended  MS: mild edema  Assessment & Plan .   [name] is a 83 y.o. female with a hx of history of chronic DVT/PE no longer on Xarelto with IVC filter placed 02/2020 and removed 05/2020, LBBB, CVA, IDDA, CKD 3, thrombophilia with prothrombin gene mutation, nonobstructive CAD, NICM, hypertension, hyperlipidemia, and family history of CAD who presented with chest pain  Chest pain Mildly elevated troponin  CAD Cath from 06/2019 showed: mild CAD, 40-50% prox LAD stenosis  Troponin 23 > 79 > 175 > 169 > 158 Echo this admission showed EF 20-25%, down from 40-45% in 09/2022  Creatinine today 1.24 -- down from yesterday  With decreased EF patient will likely need heart cath today -- will  discuss with MD Continue ASA 81 mg daily Continue IV heparin  Informed Consent   Shared Decision Making/Informed Consent The risks [stroke (1 in 1000), death (1 in 1000), kidney failure [usually temporary] (1 in 500), bleeding (1 in 200), allergic reaction [possibly serious] (1 in 200)], benefits (diagnostic support and management of coronary artery disease) and alternatives of a cardiac catheterization were discussed in detail with [name] and she is willing to proceed.     Acute on chronic systolic CHF, grade 1 diastolic dysfunction CKD stage III NYHA Class III Echo this admission: EF 20-25% (previously 40-45% in 09/2022), global hypokinesis, G1DD, normal RV function, severely dilated LA, mild to moderate MR, normal IVC Creatinine today 1.24 -- down from yesterday  Due to marked reduction in EF patient will most likely require heart cath today -- will discuss with MD  Atrial fibrillation with RVR, new onset LBBB  CHA2DS2-VASc Score = 6 Normal TSH  Currently in NSR with HR 70-80s May require outpatient ZIO monitor to determine AF burden  Continue Toprol 25 mg daily Continue on IV heparin, pending cath   Hyperlipidemia, goal LDL < 70 07/16/2023: ALT 30; HDL 66; LDL Cholesterol 42  LPA pending  Continue Lipitor 40 mg daily   For questions or updates, please contact [facility] Please consult [email or web address] for contact info under        Signed, Olena Leatherwood, PA-C   ATTENDING ATTESTATION:  After conducting a review of all available clinical information with the care team, interviewing the patient, and performing a physical exam, I agree with the findings and plan described in this note.   GEN: No acute distress.   HEENT:  MMM, JVP ~10cm, no scleral icterus Cardiac: Irregular RRR, no murmurs, rubs, or gallops.  Respiratory: Clear to auscultation bilaterally. GI: Soft, nontender, non-distended  MS: No edema; No deformity. Neuro:  Nonfocal  Vasc:  +2 radial  pulses  Patient converted from AF with aberrancy yesterday to SR with PACs today.  I reviewed the patient's TTE which demonstrated severe LV dysfunction and dyssynchrony.  I suspect the patient has developed a NICM from AF however I feel that we should exclude important coronary artery disease.  She continues to endorse atypical chest pain which may be due to elevated LVEDP.  No real anginal symptoms with exertion.  Will refer for coronary angiography +RHC today.  Otherwise continue Toprol 25mg  QPM, start losartan 25mg  QPM, and start Jardiance 10mg  Qday tomorrowII.  Will start spironolactone tomorrow depending on Cr.  I have reviewed the risks, indications, and alternatives to cardiac catheterization, possible angioplasty, and stenting with the patient. Risks include but are not limited to bleeding, infection, vascular injury, stroke, myocardial infection, arrhythmia, kidney injury, radiation-related injury in the case of prolonged fluoroscopy use, emergency cardiac surgery, and death. The patient understands the risks of serious complication is 1-2 in 1000 with diagnostic cardiac cath and 1-2% or less with angioplasty/stenting.    Alverda Skeans, MD Pager [phone]

## 2023-07-16 NOTE — Progress Notes (Addendum)
 Patient Name: [name] Date of Encounter: 07/16/2023 [facility] Cardiologist: Nanetta Batty, MD   Interval Summary  .    Patient reports feeling okay. She has no symptoms at rest but does still feel rather short of breath when she ambulates in her room She denies any current chest pain   Vital Signs .    Vitals:   07/16/23 0415 07/16/23 0437 07/16/23 0744 07/16/23 0900  BP: 128/75  (!) 148/79 126/73  Pulse:   77 75  Resp: 20  18 14   Temp: 98.2 F (36.8 C)  97.7 F (36.5 C) 98.1 F (36.7 C)  TempSrc: Oral  Oral Oral  SpO2: 94%  93% 95%  Weight:  81.1 kg    Height:        Intake/Output Summary (Last 24 hours) at 07/16/2023 0938 Last data filed at 07/16/2023 0849 Gross per 24 hour  Intake 167.11 ml  Output 600 ml  Net -432.89 ml      07/16/2023    4:37 AM 07/15/2023    6:51 PM 07/15/2023   12:15 PM  Last 3 Weights  Weight (lbs) 178 lb 14.4 oz 179 lb 6.4 oz 178 lb 9.2 oz  Weight (kg) 81.149 kg 81.375 kg 81 kg     Telemetry/ECG    Sinus rhythm, HR 70-80s with PVCs - Personally Reviewed  Physical Exam .   GEN: No acute distress.   Neck: No JVD Cardiac: RRR, no murmurs, rubs, or gallops.  Respiratory: Clear to auscultation bilaterally. GI: Soft, nontender, non-distended  MS: mild edema  Assessment & Plan .   [name] is a 83 y.o. female with a hx of history of chronic DVT/PE no longer on Xarelto with IVC filter placed 02/2020 and removed 05/2020, LBBB, CVA, IDDA, CKD 3, thrombophilia with prothrombin gene mutation, nonobstructive CAD, NICM, hypertension, hyperlipidemia, and family history of CAD who presented with chest pain  Chest pain Mildly elevated troponin  CAD Cath from 06/2019 showed: mild CAD, 40-50% prox LAD stenosis  Troponin 23 > 79 > 175 > 169 > 158 Echo this admission showed EF 20-25%, down from 40-45% in 09/2022  Creatinine today 1.24 -- down from yesterday  With decreased EF patient will likely need heart cath today -- will  discuss with MD Continue ASA 81 mg daily Continue IV heparin  Informed Consent   Shared Decision Making/Informed Consent The risks [stroke (1 in 1000), death (1 in 1000), kidney failure [usually temporary] (1 in 500), bleeding (1 in 200), allergic reaction [possibly serious] (1 in 200)], benefits (diagnostic support and management of coronary artery disease) and alternatives of a cardiac catheterization were discussed in detail with Ms. Hoe and she is willing to proceed.     Acute on chronic systolic CHF, grade 1 diastolic dysfunction CKD stage III NYHA Class III Echo this admission: EF 20-25% (previously 40-45% in 09/2022), global hypokinesis, G1DD, normal RV function, severely dilated LA, mild to moderate MR, normal IVC Creatinine today 1.24 -- down from yesterday  Due to marked reduction in EF patient will most likely require heart cath today -- will discuss with MD  Atrial fibrillation with RVR, new onset LBBB  CHA2DS2-VASc Score = 6 Normal TSH  Currently in NSR with HR 70-80s May require outpatient ZIO monitor to determine AF burden  Continue Toprol 25 mg daily Continue on IV heparin, pending cath   Hyperlipidemia, goal LDL < 70 07/16/2023: ALT 30; HDL 66; LDL Cholesterol 42  LPA pending  Continue Lipitor 40 mg daily   For questions or updates, please contact [facility] Please consult [email or web address] for contact info under        Signed, Olena Leatherwood, PA-C   ATTENDING ATTESTATION:  After conducting a review of all available clinical information with the care team, interviewing the patient, and performing a physical exam, I agree with the findings and plan described in this note.   GEN: No acute distress.   HEENT:  MMM, JVP ~10cm, no scleral icterus Cardiac: Irregular RRR, no murmurs, rubs, or gallops.  Respiratory: Clear to auscultation bilaterally. GI: Soft, nontender, non-distended  MS: No edema; No deformity. Neuro:  Nonfocal  Vasc:  +2 radial  pulses  Patient converted from AF with aberrancy yesterday to SR with PACs today.  I reviewed the patient's TTE which demonstrated severe LV dysfunction and dyssynchrony.  I suspect the patient has developed a NICM from AF however I feel that we should exclude important coronary artery disease.  She continues to endorse atypical chest pain which may be due to elevated LVEDP.  No real anginal symptoms with exertion.  Will refer for coronary angiography +RHC today.  Otherwise continue Toprol 25mg  QPM, start losartan 25mg  QPM, and start Jardiance 10mg  Qday tomorrowII.  Will start spironolactone tomorrow depending on Cr.  I have reviewed the risks, indications, and alternatives to cardiac catheterization, possible angioplasty, and stenting with the patient. Risks include but are not limited to bleeding, infection, vascular injury, stroke, myocardial infection, arrhythmia, kidney injury, radiation-related injury in the case of prolonged fluoroscopy use, emergency cardiac surgery, and death. The patient understands the risks of serious complication is 1-2 in 1000 with diagnostic cardiac cath and 1-2% or less with angioplasty/stenting.    Alverda Skeans, MD Pager [phone]

## 2023-07-16 NOTE — Progress Notes (Addendum)
 PHARMACY - ANTICOAGULATION CONSULT NOTE  Pharmacy Consult for heparin Indication: atrial fibrillation  Allergies  Allergen Reactions   Morphine And Codeine Other (See Comments)    LARGER DOSES OF MORPHINE CAUSES BODY TWITCHING   Penicillins Anaphylaxis    Childhood reaction Has patient had a PCN reaction causing immediate rash, facial/tongue/throat swelling, SOB or lightheadedness with hypotension: Yes Has patient had a PCN reaction causing severe rash involving mucus membranes or skin necrosis: Unknown Has patient had a PCN reaction that required hospitalization: No Has patient had a PCN reaction occurring within the last 10 years: no (5 or 83 yrs old) If all of the above answers are "NO", then may proceed with Cephalosporin use.    Baclofen Other (See Comments)    Other reaction(s): Unknown   Celecoxib Other (See Comments)    Other reaction(s): Unknown   Doxycycline Other (See Comments)   Levofloxacin Other (See Comments)    Other reaction(s): abnl taste   Penicillin G Benzathine Other (See Comments)   Pneumococcal Vac Polyvalent Other (See Comments)    Other reaction(s): local reaction   Sulfa Antibiotics Other (See Comments)    Unknown childhood reaction   Sulfamethoxazole Other (See Comments)   Tetanus Toxoid, Adsorbed Other (See Comments)    Other reaction(s): local reaction    Patient Measurements: Height: 5\' 3"  (160 cm) Weight: 83.5 kg (184 lb) IBW/kg (Calculated) : 52.4 Heparin Dosing Weight: 70 kg  Vital Signs: Temp: 97.7 F (36.5 C) (03/20 1124) Temp Source: Oral (03/20 1124) BP: 149/71 (03/20 1458) Pulse Rate: 0 (03/20 1458)  Labs: Recent Labs    07/15/23 0231 07/15/23 0430 07/15/23 0810 07/15/23 1012 07/15/23 1227 07/15/23 2022 07/16/23 0429  HGB 12.0  --   --   --   --  12.1 11.2*  HCT 36.4  --   --   --   --  37.1 35.1*  PLT 149*  --   --   --   --  140* 132*  HEPARINUNFRC  --   --   --   --   --  0.44 0.39  CREATININE 1.49*  --   --  1.28*   --   --  1.24*  TROPONINIHS 23*   < > 175* 169* 158*  --   --    < > = values in this interval not displayed.    Estimated Creatinine Clearance: 35.2 mL/min (A) (by C-G formula based on SCr of 1.24 mg/dL (H)).   Medical History: Past Medical History:  Diagnosis Date   Anemia    Arthritis    osteoarthritis Shoulder,neck   Bell's palsy 1980s   CHF (congestive heart failure) (HCC) 06/2019   Chronic kidney disease    Stage III   Dyspnea 06/2019   On exertion   GERD (gastroesophageal reflux disease)    Hemochromatosis    History of hiatal hernia    Hypertension    Iron deficiency anemia due to chronic blood loss 03/10/2017   PE (pulmonary embolism) 2007   x2   Peripheral vascular disease (HCC)    DVT Rt leg   Pneumonia 1990s   "walking" pneumonia   Prothrombin gene mutation (HCC) 05/30/2013   Restless legs    Right leg DVT (HCC) 05/30/2013   Stroke (HCC)    found on a MRI, she's not aware otherwise    Assessment: 56 yoF presenting with chest pain. No oral anticoagulation reported prior to admission, but previously took Xarelto for chronic DVT/PE. Patient found to  be in atrial fibrillation. Pharmacy consulted to manage heparin infusion.   Heparin level 0.39 therapeutic while on heparin drip rate 1000 units/hr.  No issues noted. Planning cath  - will follow up after for anticoagulation plans  Eventual apixaban after procedures complete $47  PM - S/p R/LHC revealing stenosis of prox LAD (no change from 2021 cath), planning for medical management (48h end 3/21 @1300 ).  Heparin to restart 2h after TR band removed.  Previously therapeutic x2 on 1000 units/hr.  Per evening RN, TR band was removed at 1800 and heparin restarted.  Instructed to hold for 2h and restart @ 2200.   Goal of Therapy:  Heparin level 0.3-0.7 units/ml Monitor platelets by anticoagulation protocol: Yes   Plan:  RESTART heparin infusion at 1000 units/hr Check 8h anti-Xa level Anti-Xa level and CBC daily while  on heparin Continue to monitor H&H and platelets  Trixie Rude, PharmD Clinical Pharmacist 07/16/2023  8:22 PM

## 2023-07-16 NOTE — Plan of Care (Signed)

## 2023-07-16 NOTE — Telephone Encounter (Signed)

## 2023-07-16 NOTE — Progress Notes (Signed)
 PHARMACY - ANTICOAGULATION CONSULT NOTE  Pharmacy Consult for heparin Indication: atrial fibrillation  Allergies  Allergen Reactions   Morphine And Codeine Other (See Comments)    LARGER DOSES OF MORPHINE CAUSES BODY TWITCHING   Penicillins Anaphylaxis    Childhood reaction Has patient had a PCN reaction causing immediate rash, facial/tongue/throat swelling, SOB or lightheadedness with hypotension: Yes Has patient had a PCN reaction causing severe rash involving mucus membranes or skin necrosis: Unknown Has patient had a PCN reaction that required hospitalization: No Has patient had a PCN reaction occurring within the last 10 years: no (5 or 83 yrs old) If all of the above answers are "NO", then may proceed with Cephalosporin use.    Baclofen Other (See Comments)    Other reaction(s): Unknown   Celecoxib Other (See Comments)    Other reaction(s): Unknown   Doxycycline Other (See Comments)   Levofloxacin Other (See Comments)    Other reaction(s): abnl taste   Penicillin G Benzathine Other (See Comments)   Pneumococcal Vac Polyvalent Other (See Comments)    Other reaction(s): local reaction   Sulfa Antibiotics Other (See Comments)    Unknown childhood reaction   Sulfamethoxazole Other (See Comments)   Tetanus Toxoid, Adsorbed Other (See Comments)    Other reaction(s): local reaction    Patient Measurements: Height: 5\' 3"  (160 cm) Weight: 83.5 kg (184 lb) IBW/kg (Calculated) : 52.4 Heparin Dosing Weight: 70 kg  Vital Signs: Temp: 97.7 F (36.5 C) (03/20 1124) Temp Source: Oral (03/20 1124) BP: 146/79 (03/20 1124) Pulse Rate: 77 (03/20 1124)  Labs: Recent Labs    07/15/23 0231 07/15/23 0430 07/15/23 0810 07/15/23 1012 07/15/23 1227 07/15/23 2022 07/16/23 0429  HGB 12.0  --   --   --   --  12.1 11.2*  HCT 36.4  --   --   --   --  37.1 35.1*  PLT 149*  --   --   --   --  140* 132*  HEPARINUNFRC  --   --   --   --   --  0.44 0.39  CREATININE 1.49*  --   --  1.28*   --   --  1.24*  TROPONINIHS 23*   < > 175* 169* 158*  --   --    < > = values in this interval not displayed.    Estimated Creatinine Clearance: 35.2 mL/min (A) (by C-G formula based on SCr of 1.24 mg/dL (H)).   Medical History: Past Medical History:  Diagnosis Date   Anemia    Arthritis    osteoarthritis Shoulder,neck   Bell's palsy 1980s   CHF (congestive heart failure) (HCC) 06/2019   Chronic kidney disease    Stage III   Dyspnea 06/2019   On exertion   GERD (gastroesophageal reflux disease)    Hemochromatosis    History of hiatal hernia    Hypertension    Iron deficiency anemia due to chronic blood loss 03/10/2017   PE (pulmonary embolism) 2007   x2   Peripheral vascular disease (HCC)    DVT Rt leg   Pneumonia 1990s   "walking" pneumonia   Prothrombin gene mutation (HCC) 05/30/2013   Restless legs    Right leg DVT (HCC) 05/30/2013   Stroke (HCC)    found on a MRI, she's not aware otherwise    Assessment: 63 yoF presenting with chest pain. No oral anticoagulation reported prior to admission, but previously took Xarelto for chronic DVT/PE. Patient found to  be in atrial fibrillation. Pharmacy consulted to manage heparin infusion.   Heparin level 0.39 therapeutic while on heparin drip rate 1000 units/hr.  No issues noted. Planning cath  - will follow up after for anticoagulation plans  Eventual apixaban after procedures complete $47  Goal of Therapy:  Heparin level 0.3-0.7 units/ml Monitor platelets by anticoagulation protocol: Yes   Plan:  Continue heparin infusion at 1000 units/hr Check anti-Xa level, CBC daily while on heparin Continue to monitor H&H and platelets  Leota Sauers Pharm.D. CPP, BCPS Clinical Pharmacist [phone] 07/16/2023 1:03 PM

## 2023-07-16 NOTE — TOC CM/SW Note (Signed)
 Transition of Care [facility]) - Inpatient Brief Assessment   Patient Details  Name: [name] MRN: [id number] Date of Birth: July 25, 1940  Transition of Care [facility]) CM/SW Contact:    Gala Lewandowsky, RN Phone Number: 07/16/2023, 1:25 PM   Clinical Narrative: Patient presented for elevated troponin. PTA patient was from home with spouse. Patient has DME bedside commode and rollator. Patient gets to appointments without any issues. No home needs identified at the time of visit.    Transition of Care Asessment: Insurance and Status: Insurance coverage has been reviewed Patient has primary care physician: Yes Home environment has been reviewed: reviewed Prior level of function:: independent Prior/Current Home Services: No current home services Social Drivers of Health Review: SDOH reviewed no interventions necessary Readmission risk has been reviewed: Yes Transition of care needs: no transition of care needs at this time

## 2023-07-16 NOTE — Progress Notes (Signed)
   07/16/23 1804  Oxygen Therapy  SpO2 (!) 86 %  O2 Device Nasal Cannula  O2 Flow Rate (L/min) (S)  3 L/min       Pt ambulated to bathroom and c/o SOB. Pt visibly working hard to breath and sweating. O2 sats checked and pt was 85% on room air. Nasal cannula placed and o2 sats WNL at this time. Pt reporting an improvement.

## 2023-07-16 NOTE — Interval H&P Note (Signed)
 History and Physical Interval Note:  07/16/2023 2:17 PM  [name]  has presented today for surgery, with the diagnosis of unstable angina.  The various methods of treatment have been discussed with the patient and family. After consideration of risks, benefits and other options for treatment, the patient has consented to  Procedure(s): RIGHT/LEFT HEART CATH AND CORONARY ANGIOGRAPHY (N/A) as a surgical intervention.  The patient's history has been reviewed, patient examined, no change in status, stable for surgery.  I have reviewed the patient's chart and labs.  Questions were answered to the patient's satisfaction.     Lorine Bears

## 2023-07-17 ENCOUNTER — Other Ambulatory Visit: Payer: Self-pay

## 2023-07-17 ENCOUNTER — Encounter: Payer: Self-pay | Admitting: Hematology & Oncology

## 2023-07-17 ENCOUNTER — Telehealth (HOSPITAL_COMMUNITY): Payer: Self-pay

## 2023-07-17 ENCOUNTER — Encounter (HOSPITAL_COMMUNITY): Payer: Self-pay | Admitting: Cardiovascular Disease

## 2023-07-17 ENCOUNTER — Other Ambulatory Visit (HOSPITAL_COMMUNITY): Payer: Self-pay

## 2023-07-17 DIAGNOSIS — R7989 Other specified abnormal findings of blood chemistry: Secondary | ICD-10-CM | POA: Diagnosis not present

## 2023-07-17 LAB — BASIC METABOLIC PANEL
Anion gap: 11 (ref 5–15)
BUN: 25 mg/dL — ABNORMAL HIGH (ref 8–23)
CO2: 24 mmol/L (ref 22–32)
Calcium: 9.5 mg/dL (ref 8.9–10.3)
Chloride: 104 mmol/L (ref 98–111)
Creatinine, Ser: 1.18 mg/dL — ABNORMAL HIGH (ref 0.44–1.00)
GFR, Estimated: 46 mL/min — ABNORMAL LOW (ref >60)
Glucose, Bld: 131 mg/dL — ABNORMAL HIGH (ref 70–99)
Potassium: 4.1 mmol/L (ref 3.5–5.1)
Sodium: 139 mmol/L (ref 135–145)

## 2023-07-17 LAB — CBC
HCT: 37 % (ref 36.0–46.0)
Hemoglobin: 11.9 g/dL — ABNORMAL LOW (ref 12.0–15.0)
MCH: 29.4 pg (ref 26.0–34.0)
MCHC: 32.2 g/dL (ref 30.0–36.0)
MCV: 91.4 fL (ref 80.0–100.0)
Platelets: 132 10*3/uL — ABNORMAL LOW (ref 150–400)
RBC: 4.05 MIL/uL (ref 3.87–5.11)
RDW: 14.9 % (ref 11.5–15.5)
WBC: 5.7 10*3/uL (ref 4.0–10.5)
nRBC: 0 % (ref 0.0–0.2)

## 2023-07-17 LAB — LIPOPROTEIN A (LPA): Lipoprotein (a): 154.6 nmol/L — ABNORMAL HIGH (ref <75.0)

## 2023-07-17 LAB — MAGNESIUM: Magnesium: 2 mg/dL (ref 1.7–2.4)

## 2023-07-17 MED ORDER — SPIRONOLACTONE 25 MG PO TABS
25.0000 mg | ORAL_TABLET | Freq: Every day | ORAL | Status: DC
  Administered 2023-07-17 – 2023-07-18 (×2): 25 mg via ORAL
  Filled 2023-07-17 (×2): qty 1

## 2023-07-17 MED ORDER — FUROSEMIDE 10 MG/ML IJ SOLN
40.0000 mg | Freq: Once | INTRAMUSCULAR | Status: AC
  Administered 2023-07-17: 40 mg via INTRAVENOUS
  Filled 2023-07-17: qty 4

## 2023-07-17 MED ORDER — APIXABAN 5 MG PO TABS
5.0000 mg | ORAL_TABLET | Freq: Two times a day (BID) | ORAL | Status: DC
Start: 2023-07-17 — End: 2023-07-18
  Administered 2023-07-17 – 2023-07-18 (×3): 5 mg via ORAL
  Filled 2023-07-17 (×3): qty 1

## 2023-07-17 MED FILL — Verapamil HCl IV Soln 2.5 MG/ML: INTRAVENOUS | Qty: 2 | Status: AC

## 2023-07-17 NOTE — Evaluation (Signed)
 Physical Therapy Evaluation Patient Details Name: [name] MRN: [id number] DOB: 1940/07/17 Today's Date: 07/17/2023  History of Present Illness  83 y.o. female presents to [facility] hospital on 07/15/2023 with chest pain. Cardiac cath on 3/20 with unchanged CAD from last cath. PMH includes chronic PE/DVT, LBBB, CVA, IDDA, CKD III, thrombophilia, CAD, NICM, HTN, HLD.  Clinical Impression  Pt presents to PT with deficits in cardiopulmonary function and activity tolerance. Pt currently requries 3L of supplemental oxygen to maintain sats at or above 92% when mobilizing. Pt has chronic L foot drop and appears to be mobilizing similarly to her baseline in terms of quality. PT will continue to follow in an effort to improve endurance. No post-acute PT recommended.        If plan is discharge home, recommend the following:     Can travel by private vehicle        Equipment Recommendations None recommended by PT  Recommendations for Other Services       Functional Status Assessment Patient has had a recent decline in their functional status and demonstrates the ability to make significant improvements in function in a reasonable and predictable amount of time.     Precautions / Restrictions Precautions Precautions: Fall Recall of Precautions/Restrictions: Intact Precaution/Restrictions Comments: chronic L foot drop Restrictions Weight Bearing Restrictions Per Provider Order: No      Mobility  Bed Mobility Overal bed mobility: Modified Independent                  Transfers Overall transfer level: Independent                      Ambulation/Gait Ambulation/Gait assistance: Modified independent (Device/Increase time) Gait Distance (Feet): 400 Feet Assistive device: None Gait Pattern/deviations: Step-through pattern Gait velocity: functional Gait velocity interpretation: 1.31 - 2.62 ft/sec, indicative of limited community ambulator   General Gait Details: pt with  steady step-through gait, decreased L foot clearance due to foot drop  Stairs            Wheelchair Mobility     Tilt Bed    Modified Rankin (Stroke Patients Only)       Balance Overall balance assessment: Needs assistance Sitting-balance support: No upper extremity supported, Feet supported Sitting balance-Leahy Scale: Good     Standing balance support: Single extremity supported, Reliant on assistive device for balance Standing balance-Leahy Scale: Poor                               Pertinent Vitals/Pain Pain Assessment Pain Assessment: No/denies pain    Home Living Family/patient expects to be discharged to:: Private residence Living Arrangements: Spouse/significant other Available Help at Discharge: Family Type of Home: House Home Access: Stairs to enter Entrance Stairs-Rails: Right Entrance Stairs-Number of Steps: 3   Home Layout: Two level;Able to live on main level with bedroom/bathroom;Laundry or work area in [employer] Equipment: Rollator (4 wheels);BSC/3in1;Adaptive equipment;Wheelchair - manual      Prior Function Prior Level of Function : Independent/Modified Independent             Mobility Comments: ambulatory with rollator       Extremity/Trunk Assessment   Upper Extremity Assessment Upper Extremity Assessment: Overall WFL for tasks assessed    Lower Extremity Assessment Lower Extremity Assessment: LLE deficits/detail LLE Deficits / Details: chronic L foot drop with inversion    Cervical / Trunk Assessment Cervical / Trunk  Assessment: Normal  Communication   Communication Communication: No apparent difficulties    Cognition Arousal: Alert Behavior During Therapy: Anxious   PT - Cognitive impairments: No apparent impairments                         Following commands: Intact       Cueing Cueing Techniques: Verbal cues     General Comments General comments (skin integrity, edema, etc.): pt  on RA upon PT arrival with sats at 89%. Pt desats to 86% when ambulating on room air, requires 3L [place] to maintain sats 92% or higher    Exercises     Assessment/Plan    PT Assessment Patient needs continued PT services  PT Problem List Decreased activity tolerance;Cardiopulmonary status limiting activity       PT Treatment Interventions DME instruction;Gait training;Stair training;Functional mobility training;Therapeutic exercise;Therapeutic activities;Patient/family education    PT Goals (Current goals can be found in the Care Plan section)  Acute Rehab PT Goals Patient Stated Goal: to return home PT Goal Formulation: With patient Time For Goal Achievement: 07/31/23 Potential to Achieve Goals: Good Additional Goals Additional Goal #1: pt will ambulate for >500' without reports of DOE to demonstrate improved activity tolerance    Frequency Min 2X/week     Co-evaluation               AM-PAC PT "6 Clicks" Mobility  Outcome Measure Help needed turning from your back to your side while in a flat bed without using bedrails?: None Help needed moving from lying on your back to sitting on the side of a flat bed without using bedrails?: None Help needed moving to and from a bed to a chair (including a wheelchair)?: None Help needed standing up from a chair using your arms (e.g., wheelchair or bedside chair)?: None Help needed to walk in hospital room?: None Help needed climbing 3-5 steps with a railing? : A Little 6 Click Score: 23    End of Session Equipment Utilized During Treatment: Oxygen Activity Tolerance: Patient tolerated treatment well Patient left: in bed;with call bell/phone within reach Nurse Communication: Mobility status PT Visit Diagnosis: Other abnormalities of gait and mobility (R26.89)    Time: 1610-9604 PT Time Calculation (min) (ACUTE ONLY): 19 min   Charges:   PT Evaluation $PT Eval Low Complexity: 1 Low   PT General Charges $$ ACUTE PT VISIT: 1  Visit         Arlyss Gandy, PT, DPT Acute Rehabilitation Office [phone]   Arlyss Gandy 07/17/2023, 5:24 PM

## 2023-07-17 NOTE — Telephone Encounter (Signed)
 Advanced Heart Failure Patient Advocate Encounter  The patient was approved for a [employer] grant that will help cover the cost of Entresto, Jardiance, Losartan, Metoprolol.  Total amount awarded, $10,000.  Effective: 06/17/2023 - 06/15/2024.  BIN [id number] PCN [id number] [id number] ID [id number]  Pharmacy provided with approval and processing information. Patient informed while inpatient.  Burnell Blanks, CPhT Rx Patient Advocate Phone: [phone]

## 2023-07-17 NOTE — Progress Notes (Signed)
 Heart Failure Stewardship Pharmacist Progress Note   PCP: Noberto Retort, MD PCP-Cardiologist: Nanetta Batty, MD    HPI:  83 yo F with PMH of CHF, chronic DVT/PE with IVC filter placed in 02/2020 and removed in 2022 (no longer on Xarelto), LBBB, CVA, CKD III, thrombophilia with prothrombin gene mutation, CAD, NICM, HTN, HLD, and sleep apnea.   Presented to the ED on 3/19 with chest pain, LE edema, and shortness of breath. Described chest pain as being centralized with tight and dull sensation. Associated diaphoresis and shortness of breath. Recently started CPAP 3 weeks prior. BP 128/101 and HR 150 on arrival. BNP 564.7, hs trop 23>>175>>158. EKG suggestive of afib RVR. CXR with no active cardiopulmonary disease. CTA negative for PE. ECHO 3/19 showed LVEF 20-25% (40-45% in 09/2022 and 12/2021), global hypokinesis, G1DD, RV normal, mild to moderate MR, RA pressure of 3 mmHg. Converted from afib to SR with PACs on 3/20. Taken for [facility] on 3/20 and found to have moderate nonobstructive CAD (relatively unchanged from 2021). RA 5, PA 36, wedge 13, CO 5.2, CI 2.8.  Met with patient and her husband at bedside today. Denies shortness of breath at rest but still with DOE. No orthopnea. Still on 3L O2 (not on supplemental oxygen at home). Trace LE edema on exam. Reviewed GDMT and potential additions prior to discharge vs at follow up. Patient reports copays would be difficult to afford. Obtained household size and income to apply for [employer] grant - communicated with patient advocate. She is interested in transitioning her PTA pharmacy to [facility] mail order for ease of medication access, especially with the new grant that will assist with covering medications like Jardiance and Entresto.   Current HF Medications: Diuretic: furosemide 40 mg IV x 1 Beta Blocker: metoprolol XL 25 mg daily ACE/ARB/ARNI: losartan 25 mg daily SGLT2i: Jardiance 10 mg daily  Prior to admission HF Medications: Diuretic:  furosemide 80 mg daily Beta blocker: metoprolol XL 25 mg daily ACE/ARB/ARNI: losartan 50 mg daily  Pertinent Lab Values: Serum creatinine 1.18, BUN 25, Potassium 4.1, Sodium 139, BNP 564.7, Magnesium 2.0  Vital Signs: Weight: 178 lbs (admission weight: 179 lbs) Blood pressure: 130/70s  Heart rate: 60-80s  I/O: net even yesterday; net -0.7L since admission  Medication Assistance / Insurance Benefits Check: Does the patient have prescription insurance?  Yes Type of insurance plan: [employer] Medicare  Does the patient qualify for medication assistance through manufacturers or grants?   Yes Eligible grants and/or patient assistance programs: [employer] grant Medication assistance applications in progress: [employer] grant  Medication assistance applications approved: pending Approved medication assistance renewals will be completed by: [facility]  Outpatient Pharmacy:  Prior to admission outpatient pharmacy: [facility] Is the patient willing to use [facility] TOC pharmacy at discharge? Yes Is the patient willing to transition their outpatient pharmacy to utilize a [facility] outpatient pharmacy?   Yes    Assessment: 1. Acute on chronic systolic CHF (LVEF 20-25%), due to NICM. NYHA class II symptoms. - Furosemide 40 mg IV x 1 given today given increased oxygen requirements  - Continue metoprolol XL 25 mg daily - On losartan 25 mg daily at bedtime. Consider transitioning to Entresto 24/26 mg BID if BP tolerates - Consider adding spironolactone 25 mg daily - Agree with adding Jardiance 10 mg daily today   Plan: 1) Medication changes recommended at this time: - Start spironolactone 25 mg daily today - Transition losartan to Entresto prior to discharge vs as outpatient  2)  Patient assistance: Sherryll Burger copay $47  - Jardiance copay $47 - [employer] grant pending to cover Entresto/Jardiance copay - Patient interested in transitioning to [facility] mail order pharmacy. Have requested that [facility]  contact [facility] pharmacy for profile transfer.   3)  Education  - Patient has been educated on current HF medications and potential additions to HF medication regimen - Patient verbalizes understanding that over the next few months, these medication doses may change and more medications may be added to optimize HF regimen - Patient has been educated on basic disease state pathophysiology and goals of therapy   Sharen Hones, PharmD, BCPS Heart Failure Stewardship Pharmacist Phone [phone]

## 2023-07-17 NOTE — Progress Notes (Signed)
 SATURATION QUALIFICATIONS: (This note is used to comply with regulatory documentation for home oxygen)  Patient Saturations on Room Air at Rest = 89%  Patient Saturations on Room Air while Ambulating = 86%  Patient Saturations on 3 Liters of oxygen while Ambulating = 92%  Please briefly explain why patient needs home oxygen: To improve tolerance for community mobility and to maintain oxygen saturation when mobilizing around the home.  Arlyss Gandy, PT, DPT Acute Rehabilitation Office [phone]

## 2023-07-17 NOTE — Care Management Important Message (Signed)
 Important Message  Patient Details  Name: [name] MRN: [id number] Date of Birth: [birth year]   Important Message Given:  Yes - Medicare IM     Renie Ora 07/17/2023, 10:32 AM

## 2023-07-17 NOTE — Progress Notes (Signed)
 Pt ambulated in hallway on RA. Pt insisted that she did not need o2 and wanted to walk with no o2 on. Pts o2 sats dropped to 86% and pt became visibly SOB. Once pt back at rest her o2 sat increased to 93% on RA. O2 remains off at this time. O2 sats 93%, will continue to monitor.

## 2023-07-17 NOTE — Discharge Instructions (Signed)

## 2023-07-17 NOTE — Progress Notes (Signed)
 PHARMACY - ANTICOAGULATION CONSULT NOTE  Pharmacy Consult for heparin Indication: atrial fibrillation  Allergies  Allergen Reactions   Morphine And Codeine Other (See Comments)    LARGER DOSES OF MORPHINE CAUSES BODY TWITCHING   Penicillins Anaphylaxis    Childhood reaction Has patient had a PCN reaction causing immediate rash, facial/tongue/throat swelling, SOB or lightheadedness with hypotension: Yes Has patient had a PCN reaction causing severe rash involving mucus membranes or skin necrosis: Unknown Has patient had a PCN reaction that required hospitalization: No Has patient had a PCN reaction occurring within the last 10 years: no (5 or 83 yrs old) If all of the above answers are "NO", then may proceed with Cephalosporin use.    Baclofen Other (See Comments)    Other reaction(s): Unknown   Celecoxib Other (See Comments)    Other reaction(s): Unknown   Doxycycline Other (See Comments)   Levofloxacin Other (See Comments)    Other reaction(s): abnl taste   Penicillin G Benzathine Other (See Comments)   Pneumococcal Vac Polyvalent Other (See Comments)    Other reaction(s): local reaction   Sulfa Antibiotics Other (See Comments)    Unknown childhood reaction   Sulfamethoxazole Other (See Comments)   Tetanus Toxoid, Adsorbed Other (See Comments)    Other reaction(s): local reaction    Patient Measurements: Height: 5\' 3"  (160 cm) Weight: 80.9 kg (178 lb 6.4 oz) IBW/kg (Calculated) : 52.4 Heparin Dosing Weight: 70 kg  Vital Signs: Temp: 97.8 F (36.6 C) (03/21 0730) Temp Source: Oral (03/21 0730) BP: 143/116 (03/21 0730) Pulse Rate: 82 (03/21 0730)  Labs: Recent Labs    07/15/23 0231 07/15/23 0430 07/15/23 0810 07/15/23 1012 07/15/23 1227 07/15/23 2022 07/16/23 0429 07/16/23 1437 07/16/23 1438  HGB 12.0  --   --   --   --  12.1 11.2* 11.2* 11.2*  HCT 36.4  --   --   --   --  37.1 35.1* 33.0* 33.0*  PLT 149*  --   --   --   --  140* 132*  --   --    HEPARINUNFRC  --   --   --   --   --  0.44 0.39  --   --   CREATININE 1.49*  --   --  1.28*  --   --  1.24*  --   --   TROPONINIHS 23*   < > 175* 169* 158*  --   --   --   --    < > = values in this interval not displayed.    Estimated Creatinine Clearance: 34.6 mL/min (A) (by C-G formula based on SCr of 1.24 mg/dL (H)).   Medical History: Past Medical History:  Diagnosis Date   Anemia    Arthritis    osteoarthritis Shoulder,neck   Bell's palsy 1980s   CHF (congestive heart failure) (HCC) 06/2019   Chronic kidney disease    Stage III   Dyspnea 06/2019   On exertion   GERD (gastroesophageal reflux disease)    Hemochromatosis    History of hiatal hernia    Hypertension    Iron deficiency anemia due to chronic blood loss 03/10/2017   PE (pulmonary embolism) 2007   x2   Peripheral vascular disease (HCC)    DVT Rt leg   Pneumonia 1990s   "walking" pneumonia   Prothrombin gene mutation (HCC) 05/30/2013   Restless legs    Right leg DVT (HCC) 05/30/2013   Stroke (HCC)    found  on a MRI, she's not aware otherwise    Assessment: 83 yoF presenting with chest pain. No oral anticoagulation reported prior to admission, but previously took Xarelto for chronic DVT/PE. Patient found to be in atrial fibrillation. Pharmacy consulted to manage heparin infusion.   Currently running on heparin infusion at 1000 units/hr - discussed with cardiology, plan to transition to apixaban today. Qualifies for 5 mg dose (age>80 but Scr<1.5 and wt>60 kg). Hgb 11.2, plt 132. No s/sx of bleeding noted.   Goal of Therapy:  Monitor platelets by anticoagulation protocol: Yes   Plan:  Stop heparin infusion  Start apixaban 5 mg BID  Monitor CBC, for s/sx of bleeding  Thank you for allowing pharmacy to participate in this patient's care,  Sherron Monday, PharmD, BCCCP Clinical Pharmacist  Phone: [phone] 07/17/2023 8:17 AM  Please check AMION for all [facility] Pharmacy phone numbers After 10:00 PM, call Main  Pharmacy [phone]

## 2023-07-17 NOTE — Plan of Care (Signed)
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
## 2023-07-17 NOTE — Progress Notes (Addendum)
 Patient Name: [name] Date of Encounter: 07/17/2023 [facility] Cardiologist: Nanetta Batty, MD   Interval Summary  .    Required O2 overnight, feels more short of breath this morning.   Vital Signs .    Vitals:   07/16/23 1942 07/16/23 2348 07/17/23 0438 07/17/23 0730  BP: 134/66 129/72 137/69 (!) 143/116  Pulse: 71 69 79 82  Resp: 20 16 20 17   Temp: 98.2 F (36.8 C) 98.1 F (36.7 C) (!) 97.5 F (36.4 C) 97.8 F (36.6 C)  TempSrc: Oral Axillary Oral Oral  SpO2: 96% 100% 95% 93%  Weight:   80.9 kg   Height:        Intake/Output Summary (Last 24 hours) at 07/17/2023 0750 Last data filed at 07/17/2023 0439 Gross per 24 hour  Intake 612.67 ml  Output 1150 ml  Net -537.33 ml      07/17/2023    4:38 AM 07/16/2023   11:36 AM 07/16/2023    4:37 AM  Last 3 Weights  Weight (lbs) 178 lb 6.4 oz 184 lb 178 lb 14.4 oz  Weight (kg) 80.922 kg 83.462 kg 81.149 kg      Telemetry/ECG    Sinus Rhythm (LBBB) - Personally Reviewed  Physical Exam .    GEN: No acute distress, on [place] @2L   Neck: No JVD Cardiac: RRR, no murmurs, rubs, or gallops.  Respiratory: Clear to auscultation bilaterally. GI: Soft, nontender, non-distended  MS: No edema Skin: right radial cath stable   Assessment & Plan .     83 y.o. female with a hx of history of chronic DVT/PE no longer on Xarelto with IVC filter placed 02/2020 and removed 05/2020, LBBB, CVA, IDDA, CKD 3, thrombophilia with prothrombin gene mutation, nonobstructive CAD, NICM, hypertension, hyperlipidemia, and family history of CAD who presented with chest pain   Chest pain Elevated troponin  CAD -- Presented with chest pain and troponin 23 > 79 > 175 > 169 > 158 -- Echo this admission showed EF 20-25%, down from 40-45% in 09/2022  -- underwent cardiac cath 3/20 with pLAD 50% unchanged from prior cath. Recommendations for continued medical therapy  -- on ASA, statin, Toprol   Acute HFrEF NICM Moderate Pulmonary  HTN -- Echo this admission: EF 20-25% (previously 40-45% in 09/2022), global hypokinesis, G1DD, normal RV function, severely dilated LA, mild to moderate MR, normal IVC Creatinine today 1.24 -- down from yesterday  -- cath noted above, therefore suspect tachy-mediated cardiomyopathy  -- increased shortness of breath and O2 requirement overnightLVEFP , will give IV lasix 40mg  x1  -- GDMT: Toprol 25mg  daily, losartan 25mg  daily, jardiance 10mg  daily. Ideally transition Entresto if possible but may be to expensive with copays   Atrial fibrillation with RVR, new onset LBBB  -- initially presented in atrial fibrillation with RVR, currently maintaing SR. CHA2DS2-VASc Score = 6, Normal TSH  -- May require outpatient ZIO monitor to determine AF burden  -- Continue Toprol 25 mg daily, transition to Eliquis 5mg  BID    Hyperlipidemia -- 07/16/2023: ALT 30; HDL 66; LDL Cholesterol 42  LPA 154 -- increase atorvastatin to 80mg  daily   PT eval today   For questions or updates, please contact [facility] Please consult [email or web address] for contact info under        Signed, Laverda Page, NP    ATTENDING ATTESTATION:  After conducting a review of all available clinical information with the care team, interviewing the patient, and performing a physical exam,  I agree with the findings and plan described in this note.   GEN: No acute distress.   HEENT:  MMM, JVP + 7-8cm, no scleral icterus Cardiac: RRR, no murmurs, rubs, or gallops.  Respiratory: Decreased BS, mild rales, mild exp wheezing lower bases L>R GI: Soft, nontender, non-distended  MS: Trace edema; No deformity. Neuro:  Nonfocal  Vasc:  +2 radial pulses  Coronary angiography with nonobstructive disease and RHC with elevated wedge pressure and PVR~4.4 c/w with mixed pulmonary hypertension picture.  Still dyspneic but improved with diuresis.  Will optimize regimen with Toprol, losartan, jardiance.  Start spironolactone 25mg   today.  Can consider conversion to [facility] as outpatient.  GDMT and re-echo for need for CRT-D.  Will need CMR as outpatient.  Likely d/c in AM, possibly later today if patient is ambulating and feels better.    Alverda Skeans, MD Pager [phone]

## 2023-07-18 ENCOUNTER — Encounter: Payer: Self-pay | Admitting: Hematology & Oncology

## 2023-07-18 ENCOUNTER — Other Ambulatory Visit (HOSPITAL_COMMUNITY): Payer: Self-pay

## 2023-07-18 DIAGNOSIS — I5021 Acute systolic (congestive) heart failure: Secondary | ICD-10-CM | POA: Diagnosis not present

## 2023-07-18 LAB — BASIC METABOLIC PANEL
Anion gap: 11 (ref 5–15)
BUN: 25 mg/dL — ABNORMAL HIGH (ref 8–23)
CO2: 24 mmol/L (ref 22–32)
Calcium: 9.2 mg/dL (ref 8.9–10.3)
Chloride: 103 mmol/L (ref 98–111)
Creatinine, Ser: 1.38 mg/dL — ABNORMAL HIGH (ref 0.44–1.00)
GFR, Estimated: 38 mL/min — ABNORMAL LOW (ref >60)
Glucose, Bld: 93 mg/dL (ref 70–99)
Potassium: 3.7 mmol/L (ref 3.5–5.1)
Sodium: 138 mmol/L (ref 135–145)

## 2023-07-18 LAB — CBC
HCT: 33.8 % — ABNORMAL LOW (ref 36.0–46.0)
Hemoglobin: 10.9 g/dL — ABNORMAL LOW (ref 12.0–15.0)
MCH: 29.3 pg (ref 26.0–34.0)
MCHC: 32.2 g/dL (ref 30.0–36.0)
MCV: 90.9 fL (ref 80.0–100.0)
Platelets: 118 10*3/uL — ABNORMAL LOW (ref 150–400)
RBC: 3.72 MIL/uL — ABNORMAL LOW (ref 3.87–5.11)
RDW: 14.7 % (ref 11.5–15.5)
WBC: 4.8 10*3/uL (ref 4.0–10.5)
nRBC: 0 % (ref 0.0–0.2)

## 2023-07-18 MED ORDER — APIXABAN 5 MG PO TABS
5.0000 mg | ORAL_TABLET | Freq: Two times a day (BID) | ORAL | 5 refills | Status: DC
  Filled 2023-07-18 – 2023-09-14 (×5): qty 60, 30d supply, fill #0
  Filled 2023-09-14 – 2023-10-07 (×2): qty 60, 30d supply, fill #1
  Filled 2023-11-16: qty 60, 30d supply, fill #2
  Filled 2023-12-16: qty 60, 30d supply, fill #3

## 2023-07-18 MED ORDER — NITROGLYCERIN 0.4 MG SL SUBL
0.4000 mg | SUBLINGUAL_TABLET | SUBLINGUAL | 2 refills | Status: AC | PRN
  Filled 2023-07-18: qty 25, 1d supply, fill #0

## 2023-07-18 MED ORDER — LOSARTAN POTASSIUM 25 MG PO TABS
25.0000 mg | ORAL_TABLET | Freq: Every day | ORAL | 5 refills | Status: DC
  Filled 2023-07-18: qty 30, 30d supply, fill #0

## 2023-07-18 MED ORDER — SPIRONOLACTONE 25 MG PO TABS
25.0000 mg | ORAL_TABLET | Freq: Every day | ORAL | 5 refills | Status: DC
  Filled 2023-07-18 – 2023-08-17 (×3): qty 30, 30d supply, fill #0
  Filled 2023-09-14: qty 30, 30d supply, fill #1
  Filled 2023-10-27: qty 30, 30d supply, fill #2
  Filled 2023-11-30: qty 30, 30d supply, fill #3
  Filled 2023-12-30: qty 30, 30d supply, fill #4

## 2023-07-18 MED ORDER — EMPAGLIFLOZIN 10 MG PO TABS
10.0000 mg | ORAL_TABLET | Freq: Every day | ORAL | 5 refills | Status: DC
  Filled 2023-07-18 – 2023-08-11 (×2): qty 30, 30d supply, fill #0
  Filled 2023-08-11: qty 30, 30d supply, fill #1
  Filled 2023-08-17: qty 30, 30d supply, fill #0
  Filled 2023-09-14: qty 30, 30d supply, fill #1
  Filled 2023-10-07: qty 30, 30d supply, fill #2
  Filled 2023-11-16: qty 30, 30d supply, fill #3
  Filled 2023-12-16: qty 30, 30d supply, fill #4

## 2023-07-18 NOTE — Discharge Summary (Signed)
 Discharge Summary    Patient ID: [name] MRN: [id number]; DOB: 02-Mar-1941  Admit date: 07/15/2023 Discharge date: 07/18/2023  PCP:  Noberto Retort, MD   [facility] Providers Cardiologist:  Nanetta Batty, MD        Discharge Diagnoses    Principal Problem:   Acute HFrEF (heart failure with reduced ejection fraction) [facility]) Active Problems:   CAD (coronary artery disease)   Atrial fibrillation with rapid ventricular response (HCC)   Elevated troponin   Diagnostic Studies/Procedures    Echocardiogram: 06/2023 IMPRESSIONS     1. Left ventricular ejection fraction, by estimation, is 20 to 25%. The  left ventricle has severely decreased function. The left ventricle  demonstrates global hypokinesis. The left ventricular internal cavity size  was moderately dilated. Left  ventricular diastolic parameters are consistent with Grade I diastolic  dysfunction (impaired relaxation).   2. Right ventricular systolic function is normal. The right ventricular  size is normal. There is normal pulmonary artery systolic pressure.   3. Left atrial size was severely dilated.   4. The mitral valve is normal in structure. Mild to moderate mitral valve  regurgitation. No evidence of mitral stenosis.   5. The aortic valve is tricuspid. Aortic valve regurgitation is not  visualized. No aortic stenosis is present.   6. The inferior vena cava is normal in size with greater than 50%  respiratory variability, suggesting right atrial pressure of 3 mmHg.    Cardiac Catheterization: 06/2023   Prox LAD lesion is 50% stenosed.   1. Moderate proximal LAD stenosis which was described on previous cardiac catheterization in 2021.  No evidence of obstructive disease 2.  Left ventricular angiography was not performed.  EF was severely reduced by echo. 3.  Right heart catheterization showed mildly elevated wedge pressure, moderate pulmonary hypertension and normal cardiac output.    Recommendations: Medical therapy for moderate nonobstructive coronary artery disease and systolic heart failure which seems to be due to nonischemic cardiomyopathy.   History of Present Illness     [name] is a 83 y.o. female with past medical history of chronic PE/DVT (IVC filter placed in 02/2020 and removed in 05/2020), chronic HFmrEF/NICM (EF 45 to 50% by echocardiogram in 2021 with NST showing a small defect and cardiac catheterization showing nonobstructive disease, EF 40 to 45% by echocardiogram in 12/2021), LBBB, history of CVA, HTN, HLD, Type II DM and Stage III CKD who presented to [facility] on [facility]15[facility]2023 for evaluation of chest pain.  Was found to be in atrial fibrillation with RVR which was a new diagnosis for her and she did spontaneously convert back to normal sinus rhythm. BNP was at 585 and Hs troponin values were elevated at 23, 79, 175, 169 and 158.  Was [facility] for further evaluation.  Hospital Course     Consultants: None  Repeat echocardiogram was performed and showed her EF was significantly reduced at 20 to 25% with global hypokinesis. RV function was normal and she did have mild to moderate MR. Given her severely reduced EF, a cardiac catheterization was recommended for definitive evaluation. This was performed on 07/16/2023 and showed 50% stenosis along the proximal LAD and no evidence of obstructive CAD. RHC showed mildly elevated wedge pressure, moderate pulmonary hypertension and normal cardiac output. Medical therapy was recommended.  The following morning, she did report increased shortness of breath and IV Lasix was administered with improvement. Did have ambulatory desaturations and Home O2 was arranged prior  to discharge. Reported feeling back to baseline on 07/18/2023. In regards to GDMT, she was continued on Toprol-XL and Losartan with Jardiance and Spironolactone being added to her medication regimen. Eliquis 5 mg twice daily was also initiated  for anticoagulation. She was restarted on PTA Lasix 80mg  daily which may need to be reduced as an outpatient. Was recommended to obtain a cardiac MRI as an outpatient and pending results of this, may benefit from CRT-D. She was examined by Dr. Lalla Brothers and deemed stable for discharge. Cardiology follow-up has been arranged.   Did the patient have an acute coronary syndrome (MI, NSTEMI, STEMI, etc) this admission?:  No.   The elevated Troponin was due to the acute medical illness (demand ischemia).   _____________  Discharge Vitals Blood pressure 130/61, pulse 74, temperature 98.6 F (37 C), temperature source Oral, resp. rate 16, height 5\' 3"  (1.6 m), weight 80.6 kg, SpO2 94%.  Filed Weights   07/16/23 1136 07/17/23 0438 07/18/23 0335  Weight: 83.5 kg 80.9 kg 80.6 kg    Labs & Radiologic Studies    CBC Recent Labs    07/17/23 0800 07/18/23 0352  WBC 5.7 4.8  HGB 11.9* 10.9*  HCT 37.0 33.8*  MCV 91.4 90.9  PLT 132* 118*   Basic Metabolic Panel Recent Labs    82/95/62 0800 07/18/23 0352  NA 139 138  K 4.1 3.7  CL 104 103  CO2 24 24  GLUCOSE 131* 93  BUN 25* 25*  CREATININE 1.18* 1.38*  CALCIUM 9.5 9.2  MG 2.0  --    Liver Function Tests Recent Labs    07/16/23 0429  AST 38  ALT 30  ALKPHOS 81  BILITOT 0.7  PROT 5.9*  ALBUMIN 3.1*   No results for input(s): "LIPASE", "AMYLASE" in the last 72 hours. High Sensitivity Troponin:   Recent Labs  Lab 07/15/23 0231 07/15/23 0430 07/15/23 0810 07/15/23 1012 07/15/23 1227  TROPONINIHS 23* 79* 175* 169* 158*    BNP Invalid input(s): "POCBNP" D-Dimer No results for input(s): "DDIMER" in the last 72 hours. Hemoglobin A1C No results for input(s): "HGBA1C" in the last 72 hours. Fasting Lipid Panel Recent Labs    07/16/23 0429  CHOL 118  HDL 66  LDLCALC 42  TRIG 50  CHOLHDL 1.8   Thyroid Function Tests Recent Labs    07/16/23 0429  TSH 3.098   _____________   CT Angio Chest PE W/Cm &/Or Wo  Cm Result Date: 07/15/2023 CLINICAL DATA:  Chest pain, shortness of breath, left arm pain, history of CHF EXAM: CT ANGIOGRAPHY CHEST WITH CONTRAST TECHNIQUE: Multidetector CT imaging of the chest was performed using the standard protocol during bolus administration of intravenous contrast. Multiplanar CT image reconstructions and MIPs were obtained to evaluate the vascular anatomy. RADIATION DOSE REDUCTION: This exam was performed according to the departmental dose-optimization program which includes automated exposure control, adjustment of the mA and/or kV according to patient size and/or use of iterative reconstruction technique. CONTRAST:  60mL OMNIPAQUE IOHEXOL 350 MG/ML SOLN COMPARISON:  Same day chest radiograph and CT chest 03/06/2023 FINDINGS: Cardiovascular: Mild cardiomegaly. No pericardial effusion. Normal caliber thoracic aorta. Aortic and coronary artery atherosclerotic calcification. Negative for pulmonary embolism. Mediastinum/Nodes: Moderate hiatal hernia. Trachea is unremarkable. No thoracic adenopathy. Lungs/Pleura: No focal consolidation, pleural effusion, or pneumothorax. Scarring in the lower lobes. Upper Abdomen: No acute abnormality. Musculoskeletal: Postoperative change right shoulder. No acute fracture. Chronic posterior left rib fractures. Review of the MIP images confirms the above findings. IMPRESSION:  1. Negative for pulmonary embolism. 2. Moderate hiatal hernia. 3. Aortic Atherosclerosis (ICD10-I70.0). Electronically Signed   By: Minerva Fester M.D.   On: 07/15/2023 03:37   DG Chest 2 View Result Date: 07/15/2023 CLINICAL DATA:  Chest pain with shortness of breath and left arm pain. EXAM: CHEST - 2 VIEW COMPARISON:  Sep 05, 2022 FINDINGS: The heart size and mediastinal contours are within normal limits. Moderate to marked severity calcification of the aortic arch is noted. Both lungs are clear. There is a stable, moderate-sized hiatal hernia. A right shoulder replacement is noted.  Multilevel degenerative changes are seen throughout the thoracic spine. IMPRESSION: 1. No active cardiopulmonary disease. 2. Stable, moderate-sized hiatal hernia. Electronically Signed   By: Aram Candela M.D.   On: 07/15/2023 02:53   Disposition   Pt is being discharged home today in good condition.  Follow-up Plans & Appointments     Follow-up Information      [facility]. Go in 9 day(s).   Specialty: Cardiology Why: Hospital follow up 07/27/2023 @ 11 am PLEASE bring a current medication list to appointment FREE valet parking, Entrance C, Off [employer] information: [address] [place] [address] [phone]        Ronney Asters, NP Follow up on 08/06/2023.   Specialty: Cardiology Why: Cardiology Follow-up on 08/06/2023 at 10:05 AM. Contact information: [address] [place] [address] [phone]         [facility] Follow up.   Why: [facility] will provide your home oxygen. Contact information: [address] [place] [address] [phone]                Discharge Instructions     Diet - low sodium heart healthy   Complete by: As directed    Discharge instructions   Complete by: As directed    Limit daily fluid intake to less than 2 Liters per day. Please limit salt intake.  Please weigh yourself every morning. Call cardiology if weight increases by 3 pounds overnight or 5 pounds in a single week.   Increase activity slowly   Complete by: As directed         Discharge Medications   Allergies as of 07/18/2023       Reactions   Morphine And Codeine Other (See Comments)   LARGER DOSES OF MORPHINE CAUSES BODY TWITCHING   Penicillins Anaphylaxis   Childhood reaction Has patient had a PCN reaction causing immediate rash, facial/tongue/throat swelling, SOB or lightheadedness with hypotension: Yes Has  patient had a PCN reaction causing severe rash involving mucus membranes or skin necrosis: Unknown Has patient had a PCN reaction that required hospitalization: No Has patient had a PCN reaction occurring within the last 10 years: no (5 or 83 yrs old) If all of the above answers are "NO", then may proceed with Cephalosporin use.   Baclofen Other (See Comments)   Other reaction(s): Unknown   Celecoxib Other (See Comments)   Other reaction(s): Unknown   Doxycycline Other (See Comments)   Levofloxacin Other (See Comments)   Other reaction(s): abnl taste   Penicillin G Benzathine Other (See Comments)   Pneumococcal Vac Polyvalent Other (See Comments)   Other reaction(s): local reaction   Sulfa Antibiotics Other (See Comments)   Unknown childhood reaction   Sulfamethoxazole Other (See Comments)   Tetanus Toxoid, Adsorbed Other (See Comments)   Other reaction(s): local reaction  Medication List     STOP taking these medications    clindamycin 150 MG capsule Commonly known as: CLEOCIN       TAKE these medications    acetaminophen 650 MG suppository Commonly known as: TYLENOL Place 650-1,300 mg rectally every 4 (four) hours as needed for mild pain (pain score 1-3) or moderate pain (pain score 4-6).   allopurinol 300 MG tablet Commonly known as: ZYLOPRIM Take 300 mg by mouth in the morning.   ARTIFICIAL TEARS OP Place 1 drop into both eyes 3 (three) times daily as needed (dry eyes).   aspirin EC 81 MG tablet Take 162 mg by mouth in the morning. Swallow whole.   atorvastatin 40 MG tablet Commonly known as: LIPITOR Take 1 tablet (40 mg total) by mouth daily at 6 PM. What changed: when to take this   Biotin 1000 MCG tablet Take 1,000 mcg by mouth in the morning.   CALCIUM 600+D3 PO Take 1 tablet by mouth in the morning and at bedtime.   cholecalciferol 25 MCG (1000 UNIT) tablet Commonly known as: VITAMIN D3 Take 1,000 Units by mouth in the morning and at  bedtime.   colchicine 0.6 MG tablet Take 0.6 mg by mouth daily. PRN   cycloSPORINE 0.05 % ophthalmic emulsion Commonly known as: RESTASIS Place 1 drop into both eyes 2 (two) times daily.   Eliquis 5 MG Tabs tablet Generic drug: apixaban Take 1 tablet (5 mg total) by mouth 2 (two) times daily.   ferrous sulfate 325 (65 FE) MG EC tablet Take 325 mg by mouth at bedtime.   FISH OIL PO Take 1 capsule by mouth in the morning.   furosemide 80 MG tablet Commonly known as: Lasix Take 1 tablet (80 mg total) by mouth daily. What changed: when to take this   Jardiance 10 MG Tabs tablet Generic drug: empagliflozin Take 1 tablet (10 mg total) by mouth daily. Start taking on: July 19, 2023   losartan 25 MG tablet Commonly known as: COZAAR Take 1 tablet (25 mg total) by mouth daily. What changed:  medication strength how much to take   metoprolol succinate 25 MG 24 hr tablet Commonly known as: TOPROL-XL Take 1 tablet by mouth once daily What changed: when to take this   multivitamin with minerals Tabs tablet Take 1 tablet by mouth in the morning. Centrum Silver (NO IRON)   nitroGLYCERIN 0.4 MG SL tablet Commonly known as: NITROSTAT Place 1 tablet (0.4 mg total) under the tongue every 5 (five) minutes x 3 doses as needed for chest pain.   omeprazole 40 MG capsule Commonly known as: PRILOSEC Take 40 mg by mouth in the morning.   oxyCODONE 5 MG immediate release tablet Commonly known as: Roxicodone Take 1 tablet (5 mg total) by mouth every 6 (six) hours as needed for severe pain (pain score 7-10).   polycarbophil 625 MG tablet Commonly known as: FIBERCON Take 625 mg by mouth in the morning and at bedtime.   spironolactone 25 MG tablet Commonly known as: ALDACTONE Take 1 tablet (25 mg total) by mouth daily. Start taking on: July 19, 2023   temazepam 15 MG capsule Commonly known as: RESTORIL Take 1 capsule (15 mg total) by mouth at bedtime.   Turmeric Curcumin 500 MG  Caps Take 500 mg by mouth at bedtime.   vitamin E 180 MG (400 UNITS) capsule Take 400 Units by mouth at bedtime.  Durable Medical Equipment  (From admission, onward)           Start     Ordered   07/18/23 1215  For home use only DME oxygen  Once       Question Answer Comment  Length of Need 6 Months   Mode or (Route) Nasal cannula   Liters per Minute 2   Oxygen delivery system Gas      07/18/23 1215               Outstanding Labs/Studies   BMET at follow-up; cMRI as outpatient.   Duration of Discharge Encounter: APP Time: 45 minutes   Signed, Ellsworth Lennox, PA-C 07/18/2023, 1:53 PM

## 2023-07-18 NOTE — Progress Notes (Signed)
 Mobility Specialist Progress Note:    07/18/23 1153  Mobility  Activity Ambulated with assistance in hallway;Ambulated with assistance in room  Level of Assistance Standby assist, set-up cues, supervision of patient - no hands on  Assistive Device Front wheel walker  Distance Ambulated (ft) 400 ft  Activity Response Tolerated well  Mobility Referral Yes  Mobility visit 1 Mobility  Mobility Specialist Start Time (ACUTE ONLY) 1146  Mobility Specialist Stop Time (ACUTE ONLY) 1153  Mobility Specialist Time Calculation (min) (ACUTE ONLY) 7 min   Pt received in bed, agreeable to mobility session. Ambulated in hallway with SBA and RW. Tolerated well, VSS throughout. Took one standing rest break, de sat SpO2 86% on RA. Recovered with pursed lip breathing, SpO2 98% on RA. Returned pt to room, left with all needs met, eager for d/c.    Feliciana Rossetti Mobility Specialist Please contact via SecureChat or  Rehab office at [phone]

## 2023-07-18 NOTE — Progress Notes (Signed)
   Rounding Note    Patient Name: [name] Date of Encounter: 07/18/2023  [facility] Cardiologist: Nanetta Batty, MD   Subjective   No acute events overnight.  Feels well this morning.  Family at bedside.  Eager to discharge.  Vital Signs    Vitals:   07/17/23 0730 07/17/23 1559 07/17/23 2003 07/18/23 0335  BP: (!) 143/116 134/72 (!) 144/70 137/63  Pulse: 82 82 79 74  Resp: 17 20 18 18   Temp: 97.8 F (36.6 C) 97.7 F (36.5 C) 98 F (36.7 C) 97.6 F (36.4 C)  TempSrc: Oral Oral Oral Oral  SpO2: 93% 95% 99% 94%  Weight:    80.6 kg  Height:       No intake or output data in the 24 hours ending 07/18/23 1112    07/18/2023    3:35 AM 07/17/2023    4:38 AM 07/16/2023   11:36 AM  Last 3 Weights  Weight (lbs) 177 lb 12.8 oz 178 lb 6.4 oz 184 lb  Weight (kg) 80.65 kg 80.922 kg 83.462 kg      Physical Exam   GEN: No acute distress.  Elderly Cardiac: RRR, no murmurs, rubs, or gallops.  Respiratory: Clear to auscultation bilaterally. Psych: Normal affect   Assessment & Plan    #Coronary artery disease #Chest pain Resolved.  Heart cath this admission without new significant stenoses.  Planning for medical therapy. Continue aspirin, statin, metoprolol  #Acute on chronic systolic heart failure NYHA class II-III.  Warm and dry on exam.  EF 20%.  EKG with sinus rhythm and left bundle-branch block. Continue spironolactone, metoprolol, losartan Will need outpatient consideration for CRT-D pending CMR results  #Atrial fibrillation New diagnosis. Continue Eliquis 5 mg by mouth twice daily  Will need home oxygen  Sheria Lang T. Lalla Brothers, MD, [facility], [facility] Cardiac Electrophysiology

## 2023-07-18 NOTE — Progress Notes (Addendum)
 Received referral for home O2. Discussed with pt DME referral. Pt agrees with DME. Pt reports that she doesn't have a preference for a DME agency. Will contact [facility] for DME referral and pt agreed. Contacted Jermaine with [employer] for DME referral. He accepted the referral.

## 2023-07-21 ENCOUNTER — Telehealth: Payer: Self-pay | Admitting: Cardiovascular Disease

## 2023-07-21 NOTE — Progress Notes (Signed)
 HEART & VASCULAR TRANSITION OF CARE CONSULT NOTE     Referring Physician: Randall An, PA PCP: Noberto Retort, MD  Primary Cardiology: Dr. Allyson Sabal  HPI: Referred to clinic by Randall An, PA for heart failure consultation.   [name] is a 83 y.o. female  with past medical history of hypertension, hyperlipidemia, LBBB, history of CVA, iron deficiency anemia, CKD stage III, history of chronic RLE DVT/PE (s/p IVC filter 11/21, removed 05/2020), thrombophilia with prothrombin gene mutation, nonobstructive CAD and nonischemic cardiomyopathy.    Echo 2/21 showed EF 45-50%.  Myoview 3/21 showed EF 29%, small defect of mild severity present in the mid anteroseptal and apical septal location, the defect was nonreversible, no ischemia was noted.    Cath 3/21 showed 40-50% proximal LAD lesion, no other coronary artery disease was seen.  LVEDP 25 mmHg, mildly elevated right heart pressure with PASP 37 mmHg, mean pressure 25 mmHg.  Medical management was recommended for LV dysfunction.    S/p IVC filter placed 11/21, then removed 2/22.   Echo 6/24 showed EF 40-45%, G1DD. She has a chronic LBBB.  Admitted 3/25 with CP. Echo showed EF down to 20-25%, normal RV. She was found to be in AF with RVR, spontaneously converted to NSR. Underwent R/LHC showing pLAD 50% lesion; mildly elevated wedge pressure, moderate pulmonary hypertension and normal cardiac output. She was diuresed and GDMT titrated. She was discharged home, weight 177 lbs.  Today she presents to [facility] for post hospital follow up. Overall feeling fine. She lives with her husband, daughter lives nearby. Uses rolling walker when outside of her house. Does OK with ADLs, no SOB walking on flat ground with her rolling walker. Denies palpitations, abnormal bleeding, CP, dizziness, edema, or PND/Orthopnea. Appetite ok. No fever or chills. Weight at home 173 pounds. Taking all medications. Wears CPAP. Husband drives her to  appts.  Family Hx: mother had CHF Social Hx: lives with husband, no ETOH/drugs/tobacco use.  Cardiac Testing  - [facility] 3/25:  pLAD 50% lesion; RA 5, PA 56/20 (36), PCWP 13, CO/CI (Fick) 5.19/2.82  - Echo 3/25: EF 20-25%, normal RV  - Echo 6/24: EF 40-45%, G1DD  - Cath 3/21: 40-50% proximal LAD lesion, no other coronary artery disease was seen.  LVEDP 25 mmHg, mildly elevated right heart pressure with PASP 37 mmHg, mean pressure 25 mmHg  - Myoview 3/21: small defect of mild severity present in the mid anteroseptal and apical septal location, the defect was nonreversible, no ischemia was noted.  - Echo 2/21: EF 45-50%  Past Medical History:  Diagnosis Date   Anemia    Arthritis    osteoarthritis Shoulder,neck   Bell's palsy 1980s   CHF (congestive heart failure) (HCC) 06/2019   Chronic kidney disease    Stage III   Dyspnea 06/2019   On exertion   GERD (gastroesophageal reflux disease)    Hemochromatosis    History of hiatal hernia    Hypertension    Iron deficiency anemia due to chronic blood loss 03/10/2017   PE (pulmonary embolism) 2007   x2   Peripheral vascular disease (HCC)    DVT Rt leg   Pneumonia 1990s   "walking" pneumonia   Prothrombin gene mutation (HCC) 05/30/2013   Restless legs    Right leg DVT (HCC) 05/30/2013   Stroke (HCC)    found on a MRI, she's not aware otherwise   Current Outpatient Medications  Medication Sig Dispense Refill   allopurinol (ZYLOPRIM) 300  MG tablet Take 300 mg by mouth in the morning.     apixaban (ELIQUIS) 5 MG TABS tablet Take 1 tablet (5 mg total) by mouth 2 (two) times daily. 60 tablet 5   atorvastatin (LIPITOR) 40 MG tablet Take 1 tablet (40 mg total) by mouth daily at 6 PM. 30 tablet 2   Biotin 1000 MCG tablet Take 1,000 mcg by mouth in the morning.     Calcium Carb-Cholecalciferol (CALCIUM 600+D3 PO) Take 1 tablet by mouth in the morning and at bedtime.     cholecalciferol (VITAMIN D) 25 MCG (1000 UNIT) tablet Take 1,000 Units  by mouth in the morning and at bedtime.     colchicine 0.6 MG tablet Take 0.6 mg by mouth daily. PRN     cycloSPORINE (RESTASIS) 0.05 % ophthalmic emulsion Place 1 drop into both eyes 2 (two) times daily.     empagliflozin (JARDIANCE) 10 MG TABS tablet Take 1 tablet (10 mg total) by mouth daily. 30 tablet 5   ferrous sulfate 325 (65 FE) MG EC tablet Take 325 mg by mouth at bedtime.     furosemide (LASIX) 80 MG tablet Take 1 tablet (80 mg total) by mouth daily. (Patient taking differently: Take 80 mg by mouth in the morning.) 90 tablet 3   Hypromellose (ARTIFICIAL TEARS OP) Place 1 drop into both eyes 3 (three) times daily as needed (dry eyes).     losartan (COZAAR) 25 MG tablet Take 1 tablet (25 mg total) by mouth daily. 30 tablet 5   metoprolol succinate (TOPROL-XL) 25 MG 24 hr tablet Take 1 tablet by mouth once daily 90 tablet 3   Multiple Vitamin (MULTIVITAMIN WITH MINERALS) TABS tablet Take 1 tablet by mouth in the morning. Centrum Silver (NO IRON)     nitroGLYCERIN (NITROSTAT) 0.4 MG SL tablet Place 1 tablet (0.4 mg total) under the tongue every 5 (five) minutes x 3 doses as needed for chest pain. 25 tablet 2   Omega-3 Fatty Acids (FISH OIL PO) Take 1 capsule by mouth in the morning.     omeprazole (PRILOSEC) 40 MG capsule Take 40 mg by mouth in the morning.     oxyCODONE (ROXICODONE) 5 MG immediate release tablet Take 1 tablet (5 mg total) by mouth every 6 (six) hours as needed for severe pain (pain score 7-10). 12 tablet 0   polycarbophil (FIBERCON) 625 MG tablet Take 625 mg by mouth in the morning and at bedtime.     spironolactone (ALDACTONE) 25 MG tablet Take 1 tablet (25 mg total) by mouth daily. 30 tablet 5   temazepam (RESTORIL) 15 MG capsule Take 1 capsule (15 mg total) by mouth at bedtime.     Turmeric Curcumin 500 MG CAPS Take 500 mg by mouth at bedtime.      vitamin E 180 MG (400 UNITS) capsule Take 400 Units by mouth at bedtime.     acetaminophen (TYLENOL) 650 MG suppository  Place 650-1,300 mg rectally every 4 (four) hours as needed for mild pain (pain score 1-3) or moderate pain (pain score 4-6). (Patient not taking: Reported on 07/27/2023)     No current facility-administered medications for this encounter.   Allergies  Allergen Reactions   Morphine And Codeine Other (See Comments)    LARGER DOSES OF MORPHINE CAUSES BODY TWITCHING   Penicillins Anaphylaxis    Childhood reaction Has patient had a PCN reaction causing immediate rash, facial/tongue/throat swelling, SOB or lightheadedness with hypotension: Yes Has patient had a PCN reaction  causing severe rash involving mucus membranes or skin necrosis: Unknown Has patient had a PCN reaction that required hospitalization: No Has patient had a PCN reaction occurring within the last 10 years: no (5 or 83 yrs old) If all of the above answers are "NO", then may proceed with Cephalosporin use.    Baclofen Other (See Comments)    Other reaction(s): Unknown   Celecoxib Other (See Comments)    Other reaction(s): Unknown   Doxycycline Other (See Comments)   Levofloxacin Other (See Comments)    Other reaction(s): abnl taste   Penicillin G Benzathine Other (See Comments)   Pneumococcal Vac Polyvalent Other (See Comments)    Other reaction(s): local reaction   Sulfa Antibiotics Other (See Comments)    Unknown childhood reaction   Sulfamethoxazole Other (See Comments)   Tetanus Toxoid, Adsorbed Other (See Comments)    Other reaction(s): local reaction   Social History   Socioeconomic History   Marital status: Married    Spouse name: Social worker   Number of children: 2   Years of education: Not on file   Highest education level: High school graduate  Occupational History   Occupation: Retired  Tobacco Use   Smoking status: Never   Smokeless tobacco: Never   Tobacco comments:    never used tobacco  Vaping Use   Vaping status: Never Used  Substance and Sexual Activity   Alcohol use: No    Alcohol/week: 0.0  standard drinks of alcohol   Drug use: No   Sexual activity: Not Currently  Other Topics Concern   Not on file  Social History Narrative   Not on file   Social Drivers of Health   Financial Resource Strain: Low Risk  (07/17/2023)   Overall Financial Resource Strain (CARDIA)    Difficulty of Paying Living Expenses: Not very hard  Food Insecurity: No Food Insecurity (07/15/2023)   Hunger Vital Sign    Worried About Running Out of Food in the Last Year: Never true    Ran Out of Food in the Last Year: Never true  Transportation Needs: No Transportation Needs (07/17/2023)   PRAPARE - Administrator, Civil Service (Medical): No    Lack of Transportation (Non-Medical): No  Physical Activity: Not on file  Stress: Not on file  Social Connections: Moderately Integrated (07/15/2023)   Social Connection and Isolation Panel [NHANES]    Frequency of Communication with Friends and Family: More than three times a week    Frequency of Social Gatherings with Friends and Family: Twice a week    Attends Religious Services: More than 4 times per year    Active Member of [employer] or Organizations: No    Attends Banker Meetings: Never    Marital Status: Married  Catering manager Violence: Not At Risk (07/15/2023)   Humiliation, Afraid, Rape, and Kick questionnaire    Fear of Current or Ex-Partner: No    Emotionally Abused: No    Physically Abused: No    Sexually Abused: No    Family History  Problem Relation Age of Onset   Congestive Heart Failure Mother    Pancreatic cancer Father    Wt Readings from Last 3 Encounters:  07/27/23 77.6 kg (171 lb 1.6 oz)  07/18/23 80.6 kg (177 lb 12.8 oz)  04/08/23 82.1 kg (181 lb)   BP 110/64   Pulse 73   Wt 77.6 kg (171 lb 1.6 oz)   SpO2 96%   BMI 30.31 kg/m  PHYSICAL EXAM: General:  NAD. No resp difficulty, walked into clinic with rolling walker, elderly HEENT: Normal Neck: Supple. No JVD. Cor: Regular rate & rhythm. No rubs,  gallops or murmurs. Lungs: Clear Abdomen: Soft, nontender, nondistended.  Extremities: No cyanosis, clubbing, rash, edema Neuro: Alert & oriented x 3, moves all 4 extremities w/o difficulty. Affect pleasant.  ECG (personally reviewed):NSR 69 bpm, IVCD QRS 152 msec  ASSESSMENT & PLAN: Chronic Systolic Heart Failure - Echo 4/09 EF 40-45% - Echo 3/25 EF 20-25% - R/LHC 3/25: no new disease, CI 2.82=>NICM - ? Etiology for drop in EF. Has LBBB, but this is chronic, ? Tachy mediated CM with new AF RVR. Arrange cMRI. - NYHA II, volume ok today. - Stop losartan. - Start Entresto 24/26 mg bid. - Continue Lasix 80 mg daily. - Continue spiro 25 mg daily. - Continue Toprol XL 25 mg daily. - Continue Jardiance 10 mg daily. No GU symptoms. - Labs today - Recommend repeating echo in 3 months. Consider CRT-D if EF does not improve with GDMT  2.  CAD - LHC 3/25: stable non-obs disease - No chest pain - Continue medical therapy - Continue statin - Consider dropping ASA as she is on [facility], will defer to cards - Consider cardiac rehab, we briefly discussed this today.  3.  AF - New diagnosis. - Spontaneously converted to SR - Consider Zio to quantify burden - NSR on ECG today - Continue Eliquis 5 mg bid. - Wears CPAP - CBC today.  NYHA II GDMT  Diuretic: Lasix 80 mg daily. BB: Toprol 25 mg daily. Ace/ARB/ARNI: start Entresto 24/26 mg bid. MRA: spironolactone 25 mg daily. SGLT2i: Jardiance 10 mg daily.  Referred to HFSW (PCP, Medications, Transportation, ETOH Abuse, Drug Abuse, Insurance, Financial ): No Refer to Pharmacy: No Refer to Home Health: No Refer to Advanced [facility]: No Refer to General Cardiology: No, established with Dr. Allyson Sabal  Follow up PRN. Keep follow up with Edd Fabian, NP on 08/06/23.  Prince Rome, FNP-BC 07/27/23

## 2023-07-21 NOTE — Telephone Encounter (Signed)
 Patient's calling to se if Dr. Allyson Sabal can order her a small oxygen portable tank. Something that is easy to carry. Please advise

## 2023-07-22 ENCOUNTER — Other Ambulatory Visit (HOSPITAL_COMMUNITY): Payer: Self-pay

## 2023-07-22 NOTE — Telephone Encounter (Signed)
 Runell Gess, MD  You33 minutes ago (12:21 PM)    I will not order O2.  This needs to be done through PCP, or pulmonologist..   Patient identification verified by 2 forms. Marilynn Rail, RN    Called and spoke to patient  Relayed provider message  Patient verbalized understanding, no questions at this time

## 2023-07-24 ENCOUNTER — Telehealth (HOSPITAL_COMMUNITY): Payer: Self-pay

## 2023-07-24 NOTE — Telephone Encounter (Signed)
 Called to confirm/remind patient of their appointment at the [facility] on 07/27/2023 11:00.   Appointment:   [x] Confirmed  [] Left mess   [] No answer/No voice mail  [] Phone not in service  Patient reminded to bring all medications and/or complete list.  Confirmed patient has transportation. Gave directions, instructed to utilize valet parking.

## 2023-07-27 ENCOUNTER — Ambulatory Visit (HOSPITAL_COMMUNITY)
Admit: 2023-07-27 | Discharge: 2023-07-27 | Disposition: A | Discharge status: Home / Self Care | Attending: Family Medicine | Admitting: Family Medicine

## 2023-07-27 ENCOUNTER — Other Ambulatory Visit (HOSPITAL_COMMUNITY): Payer: Self-pay

## 2023-07-27 ENCOUNTER — Encounter (HOSPITAL_COMMUNITY): Payer: Self-pay

## 2023-07-27 ENCOUNTER — Telehealth (HOSPITAL_COMMUNITY): Payer: Self-pay

## 2023-07-27 VITALS — BP 110/64 | HR 73 | Wt 171.1 lb

## 2023-07-27 DIAGNOSIS — N183 Chronic kidney disease, stage 3 unspecified: Secondary | ICD-10-CM | POA: Insufficient documentation

## 2023-07-27 DIAGNOSIS — I272 Pulmonary hypertension, unspecified: Secondary | ICD-10-CM | POA: Insufficient documentation

## 2023-07-27 DIAGNOSIS — I2510 Atherosclerotic heart disease of native coronary artery without angina pectoris: Secondary | ICD-10-CM

## 2023-07-27 DIAGNOSIS — I5022 Chronic systolic (congestive) heart failure: Secondary | ICD-10-CM | POA: Diagnosis not present

## 2023-07-27 DIAGNOSIS — I480 Paroxysmal atrial fibrillation: Secondary | ICD-10-CM

## 2023-07-27 DIAGNOSIS — Z7984 Long term (current) use of oral hypoglycemic drugs: Secondary | ICD-10-CM | POA: Insufficient documentation

## 2023-07-27 DIAGNOSIS — I428 Other cardiomyopathies: Secondary | ICD-10-CM | POA: Diagnosis not present

## 2023-07-27 DIAGNOSIS — I447 Left bundle-branch block, unspecified: Secondary | ICD-10-CM | POA: Insufficient documentation

## 2023-07-27 DIAGNOSIS — Z8673 Personal history of transient ischemic attack (TIA), and cerebral infarction without residual deficits: Secondary | ICD-10-CM | POA: Insufficient documentation

## 2023-07-27 DIAGNOSIS — D6852 Prothrombin gene mutation: Secondary | ICD-10-CM | POA: Insufficient documentation

## 2023-07-27 DIAGNOSIS — I130 Hypertensive heart and chronic kidney disease with heart failure and stage 1 through stage 4 chronic kidney disease, or unspecified chronic kidney disease: Secondary | ICD-10-CM | POA: Diagnosis not present

## 2023-07-27 LAB — CBC
HCT: 39.3 % (ref 36.0–46.0)
Hemoglobin: 12.8 g/dL (ref 12.0–15.0)
MCH: 29.5 pg (ref 26.0–34.0)
MCHC: 32.6 g/dL (ref 30.0–36.0)
MCV: 90.6 fL (ref 80.0–100.0)
Platelets: 184 10*3/uL (ref 150–400)
RBC: 4.34 MIL/uL (ref 3.87–5.11)
RDW: 14.6 % (ref 11.5–15.5)
WBC: 5.6 10*3/uL (ref 4.0–10.5)
nRBC: 0 % (ref 0.0–0.2)

## 2023-07-27 LAB — BASIC METABOLIC PANEL WITH GFR
Anion gap: 15 (ref 5–15)
BUN: 45 mg/dL — ABNORMAL HIGH (ref 8–23)
CO2: 22 mmol/L (ref 22–32)
Calcium: 10 mg/dL (ref 8.9–10.3)
Chloride: 103 mmol/L (ref 98–111)
Creatinine, Ser: 1.56 mg/dL — ABNORMAL HIGH (ref 0.44–1.00)
GFR, Estimated: 33 mL/min — ABNORMAL LOW (ref >60)
Glucose, Bld: 104 mg/dL — ABNORMAL HIGH (ref 70–99)
Potassium: 4 mmol/L (ref 3.5–5.1)
Sodium: 140 mmol/L (ref 135–145)

## 2023-07-27 LAB — BRAIN NATRIURETIC PEPTIDE: B Natriuretic Peptide: 240.8 pg/mL — ABNORMAL HIGH (ref 0.0–100.0)

## 2023-07-27 MED ORDER — ENTRESTO 24-26 MG PO TABS
1.0000 | ORAL_TABLET | Freq: Two times a day (BID) | ORAL | 11 refills | Status: AC
  Filled 2023-07-27 – 2023-07-30 (×2): qty 60, 30d supply, fill #0
  Filled 2023-08-25: qty 60, 30d supply, fill #1
  Filled 2023-09-28: qty 60, 30d supply, fill #2
  Filled 2023-10-27: qty 60, 30d supply, fill #3
  Filled 2023-11-30: qty 60, 30d supply, fill #4
  Filled 2023-12-30: qty 60, 30d supply, fill #5
  Filled 2024-02-01: qty 60, 30d supply, fill #6
  Filled 2024-02-25: qty 60, 30d supply, fill #7
  Filled 2024-03-26: qty 60, 30d supply, fill #8
  Filled 2024-04-25: qty 60, 30d supply, fill #9
  Filled 2024-05-21: qty 60, 30d supply, fill #10

## 2023-07-27 NOTE — Patient Instructions (Signed)
 STOP Losartan  START Entresto 24/26 mg Twice daily  Labs done today, your results will be available in MyChart, we will contact you for abnormal readings.  Your physician has requested that you have a cardiac MRI. Cardiac MRI uses a computer to create images of your heart as its beating, producing both still and moving pictures of your heart and major blood vessels. For further information please visit [email or web address]. Please follow the instruction sheet given to you today for more information.  Thank you for allowing [place] to provider your heart failure care after your recent hospitalization. Please follow-up with Dr. Allyson Sabal as scheduled.  If you have any questions, issues, or concerns before your next appointment please call our office at [phone], opt. 2 and leave a message for the triage nurse.

## 2023-07-27 NOTE — Telephone Encounter (Addendum)
 Pt aware, agreeable, and verbalized understanding  Has follow up with Gen cards on 08/06/23 will get labs repeated there   ----- Message from Jacklynn Ganong sent at 07/27/2023  4:01 PM EDT ----- Renal function up a bit.  Repeat BMET in 2 weeks to follow

## 2023-07-28 ENCOUNTER — Inpatient Hospital Stay (HOSPITAL_BASED_OUTPATIENT_CLINIC_OR_DEPARTMENT_OTHER): Admitting: Family

## 2023-07-28 ENCOUNTER — Inpatient Hospital Stay: Attending: Hematology & Oncology

## 2023-07-28 VITALS — BP 115/59 | HR 70 | Temp 97.7°F | Resp 18 | Wt 174.0 lb

## 2023-07-28 DIAGNOSIS — I5030 Unspecified diastolic (congestive) heart failure: Secondary | ICD-10-CM | POA: Diagnosis not present

## 2023-07-28 DIAGNOSIS — D6852 Prothrombin gene mutation: Secondary | ICD-10-CM

## 2023-07-28 DIAGNOSIS — I4891 Unspecified atrial fibrillation: Secondary | ICD-10-CM | POA: Diagnosis not present

## 2023-07-28 DIAGNOSIS — Z7901 Long term (current) use of anticoagulants: Secondary | ICD-10-CM | POA: Insufficient documentation

## 2023-07-28 DIAGNOSIS — I82401 Acute embolism and thrombosis of unspecified deep veins of right lower extremity: Secondary | ICD-10-CM | POA: Diagnosis not present

## 2023-07-28 DIAGNOSIS — D500 Iron deficiency anemia secondary to blood loss (chronic): Secondary | ICD-10-CM

## 2023-07-28 DIAGNOSIS — Z86718 Personal history of other venous thrombosis and embolism: Secondary | ICD-10-CM | POA: Diagnosis not present

## 2023-07-28 LAB — CBC WITH DIFFERENTIAL (CANCER CENTER ONLY)
Abs Immature Granulocytes: 0.03 10*3/uL (ref 0.00–0.07)
Basophils Absolute: 0 10*3/uL (ref 0.0–0.1)
Basophils Relative: 1 %
Eosinophils Absolute: 0.1 10*3/uL (ref 0.0–0.5)
Eosinophils Relative: 2 %
HCT: 40.1 % (ref 36.0–46.0)
Hemoglobin: 13.2 g/dL (ref 12.0–15.0)
Immature Granulocytes: 1 %
Lymphocytes Relative: 23 %
Lymphs Abs: 1.3 10*3/uL (ref 0.7–4.0)
MCH: 30.1 pg (ref 26.0–34.0)
MCHC: 32.9 g/dL (ref 30.0–36.0)
MCV: 91.3 fL (ref 80.0–100.0)
Monocytes Absolute: 0.4 10*3/uL (ref 0.1–1.0)
Monocytes Relative: 7 %
Neutro Abs: 3.8 10*3/uL (ref 1.7–7.7)
Neutrophils Relative %: 66 %
Platelet Count: 180 10*3/uL (ref 150–400)
RBC: 4.39 MIL/uL (ref 3.87–5.11)
RDW: 14.6 % (ref 11.5–15.5)
WBC Count: 5.8 10*3/uL (ref 4.0–10.5)
nRBC: 0 % (ref 0.0–0.2)

## 2023-07-28 LAB — CMP (CANCER CENTER ONLY)
ALT: 20 U/L (ref 0–44)
AST: 27 U/L (ref 15–41)
Albumin: 4.5 g/dL (ref 3.5–5.0)
Alkaline Phosphatase: 117 U/L (ref 38–126)
Anion gap: 9 (ref 5–15)
BUN: 46 mg/dL — ABNORMAL HIGH (ref 8–23)
CO2: 30 mmol/L (ref 22–32)
Calcium: 10 mg/dL (ref 8.9–10.3)
Chloride: 98 mmol/L (ref 98–111)
Creatinine: 1.7 mg/dL — ABNORMAL HIGH (ref 0.44–1.00)
GFR, Estimated: 30 mL/min — ABNORMAL LOW (ref >60)
Glucose, Bld: 107 mg/dL — ABNORMAL HIGH (ref 70–99)
Potassium: 4.5 mmol/L (ref 3.5–5.1)
Sodium: 137 mmol/L (ref 135–145)
Total Bilirubin: 0.5 mg/dL (ref 0.0–1.2)
Total Protein: 7.8 g/dL (ref 6.5–8.1)

## 2023-07-28 LAB — RETICULOCYTES
Immature Retic Fract: 13.8 % (ref 2.3–15.9)
RBC.: 4.33 MIL/uL (ref 3.87–5.11)
Retic Count, Absolute: 77.1 10*3/uL (ref 19.0–186.0)
Retic Ct Pct: 1.8 % (ref 0.4–3.1)

## 2023-07-28 LAB — IRON AND IRON BINDING CAPACITY (CC-WL,HP ONLY)
Iron: 98 ug/dL (ref 28–170)
Saturation Ratios: 36 % — ABNORMAL HIGH (ref 10.4–31.8)
TIBC: 276 ug/dL (ref 250–450)
UIBC: 178 ug/dL (ref 148–442)

## 2023-07-28 LAB — FERRITIN: Ferritin: 119 ng/mL (ref 11–307)

## 2023-07-28 NOTE — Progress Notes (Signed)
 Hematology and Oncology Follow Up Visit  [name] [id number] January 30, 1941 83 y.o. 07/28/2023   Principle Diagnosis:  DVT of the right leg Prothrombin II gene mutation Hemachromatosis (homozygous for C282Y  Mutation) Past history of right lower extremity DVT/PE Iron deficiency anemia secondary to blood loss   Current Therapy:        Eliquis 5 mg po BID - atrial fib IV iron as indicated     Interim History:  [name] is here today for follow-up. She was in the hospital last week with new onset of atrial fib and acute on chronic heart failure.  ECHO EF was 20-25% and heart cath showed proximal LAD lesion 50% stenosed.  She is waiting for her Sherryll Burger to come to her house and will then stop her Losartan and start the new medications. She is also taking her Eliquis and diuretics.  No issue so far with blood loss. No abnormal bruising, no petechiae.  She has some fatigue and states that she is having trouble tolerating her CPAP. She plans to call the number on her machine to request a new mask fitting.  No fever, chills, n/v, cough, rash, dizziness, abdominal pain or changes in bowel or bladder habits.  No chest pain or palpitations at this time.  No swelling or tingling in her extremities at this time.  She has had a couple falls prior to her hospitalization. Nothing since being back at home. She ambulates with her Rolator for added support.  Appetite and hydration are good. Weight is 174 lbs.   ECOG Performance Status: 1 - Symptomatic but completely ambulatory  Medications:  Allergies as of 07/28/2023       Reactions   Morphine And Codeine Other (See Comments)   LARGER DOSES OF MORPHINE CAUSES BODY TWITCHING   Penicillins Anaphylaxis   Childhood reaction Has patient had a PCN reaction causing immediate rash, facial/tongue/throat swelling, SOB or lightheadedness with hypotension: Yes Has patient had a PCN reaction causing severe rash involving mucus membranes or skin necrosis:  Unknown Has patient had a PCN reaction that required hospitalization: No Has patient had a PCN reaction occurring within the last 10 years: no (5 or 83 yrs old) If all of the above answers are "NO", then may proceed with Cephalosporin use.   Baclofen Other (See Comments)   Other reaction(s): Unknown   Celecoxib Other (See Comments)   Other reaction(s): Unknown   Doxycycline Other (See Comments)   Levofloxacin Other (See Comments)   Other reaction(s): abnl taste   Penicillin G Benzathine Other (See Comments)   Pneumococcal Vac Polyvalent Other (See Comments)   Other reaction(s): local reaction   Sulfa Antibiotics Other (See Comments)   Unknown childhood reaction   Sulfamethoxazole Other (See Comments)   Tetanus Toxoid, Adsorbed Other (See Comments)   Other reaction(s): local reaction        Medication List        Accurate as of July 28, 2023  2:01 PM. If you have any questions, ask your nurse or doctor.          acetaminophen 650 MG suppository Commonly known as: TYLENOL Place 650-1,300 mg rectally every 4 (four) hours as needed for mild pain (pain score 1-3) or moderate pain (pain score 4-6).   allopurinol 300 MG tablet Commonly known as: ZYLOPRIM Take 300 mg by mouth in the morning.   ARTIFICIAL TEARS OP Place 1 drop into both eyes 3 (three) times daily as needed (dry eyes).   atorvastatin 40  MG tablet Commonly known as: LIPITOR Take 1 tablet (40 mg total) by mouth daily at 6 PM.   Biotin 1000 MCG tablet Take 1,000 mcg by mouth in the morning.   CALCIUM 600+D3 PO Take 1 tablet by mouth in the morning and at bedtime.   cholecalciferol 25 MCG (1000 UNIT) tablet Commonly known as: VITAMIN D3 Take 1,000 Units by mouth in the morning and at bedtime.   colchicine 0.6 MG tablet Take 0.6 mg by mouth daily. PRN   cycloSPORINE 0.05 % ophthalmic emulsion Commonly known as: RESTASIS Place 1 drop into both eyes 2 (two) times daily.   Eliquis 5 MG Tabs  tablet Generic drug: apixaban Take 1 tablet (5 mg total) by mouth 2 (two) times daily.   Entresto 24-26 MG Generic drug: sacubitril-valsartan Take 1 tablet by mouth 2 (two) times daily.   ferrous sulfate 325 (65 FE) MG EC tablet Take 325 mg by mouth at bedtime.   FISH OIL PO Take 1 capsule by mouth in the morning.   furosemide 80 MG tablet Commonly known as: Lasix Take 1 tablet (80 mg total) by mouth daily. What changed: when to take this   Jardiance 10 MG Tabs tablet Generic drug: empagliflozin Take 1 tablet (10 mg total) by mouth daily.   metoprolol succinate 25 MG 24 hr tablet Commonly known as: TOPROL-XL Take 1 tablet by mouth once daily   multivitamin with minerals Tabs tablet Take 1 tablet by mouth in the morning. Centrum Silver (NO IRON)   nitroGLYCERIN 0.4 MG SL tablet Commonly known as: NITROSTAT Place 1 tablet (0.4 mg total) under the tongue every 5 (five) minutes x 3 doses as needed for chest pain.   omeprazole 40 MG capsule Commonly known as: PRILOSEC Take 40 mg by mouth in the morning.   oxyCODONE 5 MG immediate release tablet Commonly known as: Roxicodone Take 1 tablet (5 mg total) by mouth every 6 (six) hours as needed for severe pain (pain score 7-10).   polycarbophil 625 MG tablet Commonly known as: FIBERCON Take 625 mg by mouth in the morning and at bedtime.   spironolactone 25 MG tablet Commonly known as: ALDACTONE Take 1 tablet (25 mg total) by mouth daily.   temazepam 15 MG capsule Commonly known as: RESTORIL Take 1 capsule (15 mg total) by mouth at bedtime.   Turmeric Curcumin 500 MG Caps Take 500 mg by mouth at bedtime.   vitamin E 180 MG (400 UNITS) capsule Take 400 Units by mouth at bedtime.        Allergies:  Allergies  Allergen Reactions   Morphine And Codeine Other (See Comments)    LARGER DOSES OF MORPHINE CAUSES BODY TWITCHING   Penicillins Anaphylaxis    Childhood reaction Has patient had a PCN reaction causing  immediate rash, facial/tongue/throat swelling, SOB or lightheadedness with hypotension: Yes Has patient had a PCN reaction causing severe rash involving mucus membranes or skin necrosis: Unknown Has patient had a PCN reaction that required hospitalization: No Has patient had a PCN reaction occurring within the last 10 years: no (5 or 83 yrs old) If all of the above answers are "NO", then may proceed with Cephalosporin use.    Baclofen Other (See Comments)    Other reaction(s): Unknown   Celecoxib Other (See Comments)    Other reaction(s): Unknown   Doxycycline Other (See Comments)   Levofloxacin Other (See Comments)    Other reaction(s): abnl taste   Penicillin G Benzathine Other (See Comments)  Pneumococcal Vac Polyvalent Other (See Comments)    Other reaction(s): local reaction   Sulfa Antibiotics Other (See Comments)    Unknown childhood reaction   Sulfamethoxazole Other (See Comments)   Tetanus Toxoid, Adsorbed Other (See Comments)    Other reaction(s): local reaction    Past Medical History, Surgical history, Social history, and Family History were reviewed and updated.  Review of Systems: All other 10 point review of systems is negative.   Physical Exam:  weight is 174 lb (78.9 kg). Her oral temperature is 97.7 F (36.5 C). Her blood pressure is 115/59 (abnormal) and her pulse is 70. Her respiration is 18 and oxygen saturation is 99%.   Wt Readings from Last 3 Encounters:  07/28/23 174 lb (78.9 kg)  07/27/23 171 lb 1.6 oz (77.6 kg)  07/18/23 177 lb 12.8 oz (80.6 kg)    Ocular: Sclerae unicteric, pupils equal, round and reactive to light Ear-nose-throat: Oropharynx clear, dentition fair Lymphatic: No cervical or supraclavicular adenopathy Lungs no rales or rhonchi, good excursion bilaterally Heart regular rate and rhythm, no murmur appreciated Abd soft, nontender, positive bowel sounds MSK no focal spinal tenderness, no joint edema Neuro: non-focal, well-oriented,  appropriate affect Breasts: Deferred   Lab Results  Component Value Date   WBC 5.8 07/28/2023   HGB 13.2 07/28/2023   HCT 40.1 07/28/2023   MCV 91.3 07/28/2023   PLT 180 07/28/2023   Lab Results  Component Value Date   FERRITIN 88 01/13/2023   IRON 55 01/13/2023   TIBC 293 01/13/2023   UIBC 238 01/13/2023   IRONPCTSAT 19 01/13/2023   Lab Results  Component Value Date   RETICCTPCT 1.8 07/28/2023   RBC 4.39 07/28/2023   RBC 4.33 07/28/2023   RETICCTABS 39.7 05/11/2014   No results found for: "KPAFRELGTCHN", "LAMBDASER", "KAPLAMBRATIO" No results found for: "IGGSERUM", "IGA", "IGMSERUM" No results found for: "TOTALPROTELP", "ALBUMINELP", "A1GS", "A2GS", "BETS", "BETA2SER", "GAMS", "MSPIKE", "SPEI"   Chemistry      Component Value Date/Time   NA 137 07/28/2023 1309   NA 142 11/14/2022 0000   NA 144 04/13/2017 1134   NA 142 07/28/2016 1144   K 4.5 07/28/2023 1309   K 3.3 04/13/2017 1134   K 3.6 07/28/2016 1144   CL 98 07/28/2023 1309   CL 97 (L) 04/13/2017 1134   CO2 30 07/28/2023 1309   CO2 32 04/13/2017 1134   CO2 33 (H) 07/28/2016 1144   BUN 46 (H) 07/28/2023 1309   BUN 41 (H) 11/14/2022 0000   BUN 18 04/13/2017 1134   BUN 22.6 07/28/2016 1144   CREATININE 1.70 (H) 07/28/2023 1309   CREATININE 1.0 04/13/2017 1134   CREATININE 1.0 07/28/2016 1144      Component Value Date/Time   CALCIUM 10.0 07/28/2023 1309   CALCIUM 10.1 04/13/2017 1134   CALCIUM 9.5 07/28/2016 1144   ALKPHOS 117 07/28/2023 1309   ALKPHOS 103 (H) 04/13/2017 1134   ALKPHOS 84 07/28/2016 1144   AST 27 07/28/2023 1309   AST 24 07/28/2016 1144   ALT 20 07/28/2023 1309   ALT 27 04/13/2017 1134   ALT 17 07/28/2016 1144   BILITOT 0.5 07/28/2023 1309   BILITOT 0.41 07/28/2016 1144       Impression and Plan:  [name] is a very pleasant 83 yo caucasian female with a chronic DVT in the right lower extremity.  She has changed over to Eliquis 5 mg PO BID for atrial fib. Iron studies  pending.  Follow-up in 6 months.  Eileen Stanford, NP 4/1/20252:01 PM

## 2023-07-30 ENCOUNTER — Other Ambulatory Visit (HOSPITAL_COMMUNITY): Payer: Self-pay

## 2023-07-30 ENCOUNTER — Other Ambulatory Visit: Payer: Self-pay

## 2023-07-30 DIAGNOSIS — D508 Other iron deficiency anemias: Secondary | ICD-10-CM | POA: Diagnosis not present

## 2023-07-30 DIAGNOSIS — K5909 Other constipation: Secondary | ICD-10-CM | POA: Diagnosis not present

## 2023-07-30 DIAGNOSIS — I700 Atherosclerosis of aorta: Secondary | ICD-10-CM | POA: Diagnosis not present

## 2023-07-30 DIAGNOSIS — I10 Essential (primary) hypertension: Secondary | ICD-10-CM | POA: Diagnosis not present

## 2023-07-30 DIAGNOSIS — I5043 Acute on chronic combined systolic (congestive) and diastolic (congestive) heart failure: Secondary | ICD-10-CM | POA: Diagnosis not present

## 2023-07-30 DIAGNOSIS — N1832 Chronic kidney disease, stage 3b: Secondary | ICD-10-CM | POA: Diagnosis not present

## 2023-07-30 DIAGNOSIS — Z9889 Other specified postprocedural states: Secondary | ICD-10-CM | POA: Diagnosis not present

## 2023-07-31 ENCOUNTER — Other Ambulatory Visit (HOSPITAL_COMMUNITY): Payer: Self-pay

## 2023-08-03 NOTE — Progress Notes (Unsigned)
 Cardiology Clinic Note   Patient Name: [name] Date of Encounter: 08/03/2023  Primary Care Provider:  Noberto Retort, MD Primary Cardiologist:  Nanetta Batty, MD  Patient Profile    [name] 83 year old female presents to the clinic today for follow-up evaluation of her CHF and coronary artery disease.  Past Medical History    Past Medical History:  Diagnosis Date   Anemia    Arthritis    osteoarthritis Shoulder,neck   Bell's palsy 1980s   CHF (congestive heart failure) (HCC) 06/2019   Chronic kidney disease    Stage III   Dyspnea 06/2019   On exertion   GERD (gastroesophageal reflux disease)    Hemochromatosis    History of hiatal hernia    Hypertension    Iron deficiency anemia due to chronic blood loss 03/10/2017   PE (pulmonary embolism) 2007   x2   Peripheral vascular disease (HCC)    DVT Rt leg   Pneumonia 1990s   "walking" pneumonia   Prothrombin gene mutation (HCC) 05/30/2013   Restless legs    Right leg DVT (HCC) 05/30/2013   Stroke (HCC)    found on a MRI, she's not aware otherwise   Past Surgical History:  Procedure Laterality Date   BACK SURGERY     CARDIAC CATHETERIZATION Bilateral 2005   COLONOSCOPY     IVC FILTER INSERTION N/A 03/12/2020   Procedure: IVC FILTER INSERTION;  Surgeon: Maeola Harman, MD;  Location: [facility] INVASIVE CV LAB;  Service: Cardiovascular;  Laterality: N/A;   IVC FILTER REMOVAL N/A 06/11/2020   Procedure: IVC FILTER REMOVAL;  Surgeon: Maeola Harman, MD;  Location: [facility] INVASIVE CV LAB;  Service: Cardiovascular;  Laterality: N/A;   REVERSE SHOULDER ARTHROPLASTY Right 03/07/2021   Procedure: REVERSE SHOULDER ARTHROPLASTY;  Surgeon: Francena Hanly, MD;  Location: [facility] ORS;  Service: Orthopedics;  Laterality: Right;    RIGHT/LEFT HEART CATH AND CORONARY ANGIOGRAPHY N/A 07/12/2019   Procedure: RIGHT/LEFT HEART CATH AND CORONARY ANGIOGRAPHY;  Surgeon: Lennette Bihari, MD;  Location: [facility] INVASIVE CV  LAB;  Service: Cardiovascular;  Laterality: N/A;   RIGHT/LEFT HEART CATH AND CORONARY ANGIOGRAPHY N/A 07/16/2023   Procedure: RIGHT/LEFT HEART CATH AND CORONARY ANGIOGRAPHY;  Surgeon: Iran Ouch, MD;  Location: [facility] INVASIVE CV LAB;  Service: Cardiovascular;  Laterality: N/A;   SHOULDER SURGERY Bilateral    TOTAL KNEE ARTHROPLASTY Right 03/16/2020   Procedure: TOTAL KNEE ARTHROPLASTY;  Surgeon: Jene Every, MD;  Location: [facility];  Service: Orthopedics;  Laterality: Right;  2.5 hrs    Allergies  Allergies  Allergen Reactions   Morphine And Codeine Other (See Comments)    LARGER DOSES OF MORPHINE CAUSES BODY TWITCHING   Penicillins Anaphylaxis    Childhood reaction Has patient had a PCN reaction causing immediate rash, facial/tongue/throat swelling, SOB or lightheadedness with hypotension: Yes Has patient had a PCN reaction causing severe rash involving mucus membranes or skin necrosis: Unknown Has patient had a PCN reaction that required hospitalization: No Has patient had a PCN reaction occurring within the last 10 years: no (5 or 83 yrs old) If all of the above answers are "NO", then may proceed with Cephalosporin use.    Baclofen Other (See Comments)    Other reaction(s): Unknown   Celecoxib Other (See Comments)    Other reaction(s): Unknown   Doxycycline Other (See Comments)   Levofloxacin Other (See Comments)    Other reaction(s): abnl taste   Penicillin G Benzathine Other (See  Comments)   Pneumococcal Vac Polyvalent Other (See Comments)    Other reaction(s): local reaction   Sulfa Antibiotics Other (See Comments)    Unknown childhood reaction   Sulfamethoxazole Other (See Comments)   Tetanus Toxoid, Adsorbed Other (See Comments)    Other reaction(s): local reaction    History of Present Illness    [name] has a PMH of right leg DVT, hypertension, LBBB, CHF, AKI, hypokalemia, DOE, HLD, anemia, and total knee replacement. She may have also had a remote CVA.   Is noted to have progressive DOE over the last several years.  She was admitted to the hospital 3/21 for 4 days with CHF.  She underwent right and left heart cath by Dr. Tresa Endo that showed 40% proximal LAD with elevated right atrial pressures, pulmonary artery pressure, wedge pressure and LVEDP.  She was diuresed at that time.  Her admission weight on 06/03/2019 was 188.   She was  seen by Dr. Allyson Sabal on 08/16/2019.  During that time she denied chest pain.  Her weight was 172 pounds.  She was following a low-sodium diet, and was taking furosemide 80 mg daily.  She denied shortness of breath.     She presented to the clinic 11/15/2019 for follow-up evaluation and stated she felt well .  She was having some right shoulder and right knee pain.  She was in the emergency department recently with right shoulder pain and was told it was related to arthritis.  She was also seeing orthopedics for her right knee pain.  She was planning to have a total knee replacement in the future.  She was unsure whether she would pursue this or not.  She stated that her blood pressure was better controlled and that she  had no chest discomfort.   She underwent IVC filter placement and bilateral renal veins 03/12/2020 by Dr. Randie Heinz.  This was done prior to her right total knee arthroplasty.  Her IVC filters were removed 06/11/2020.   She presented to the clinic 08/02/20 for follow-up evaluation and stated she felt well.  She had been maintaining her low-sodium diet.  She reported that she  had some pain in her knee since having her knee replacement.  She also noted she was having nosebleeds and was taken off of her Eliquis.  She was taking 2 baby aspirin and had not had any further episodes of bleeding.  She reported that she had not been very physically active because she is worried about falls.  I asked her to use her walker on level ground and increase her physical activity.  I asked her to maintain her low-sodium diet, increase her  physical activity, and planned follow-up in 6 months with Dr. Allyson Sabal.  She contacted the clinic 08/08/2021 and reported weight gain.  She was instructed on low-sodium diet.  She presented  to the clinic 08/29/21 for follow-up evaluation and stated over the last several days she had noticed swelling in her ankles.  She reported that she occasionally would eat out and was unable to control the amount of sodium in that food.  She was otherwise conscious and ate a low-sodium diet.  She reported that she was not very physically active.  She had been sleeping in a recliner due to the arthritis in her left shoulder.  She did notice some increased shortness of breath with increased physical activity and bending over.  She denied chest pain.  She had noticed some vaginal bleeding and had appointment with GYN  the next week.  She denied orthopnea PND.  I increased her furosemide for 2 days, gave supplemental potassium for 2 days.  I gave her the salty 6 diet sheet ordered a BMP in 1 week and planned follow-up in 4 to 6 months.    She was seen in follow-up by Marjie Skiff, PA-C on 03/25/2022.  She had presented to the emergency department 03/24/2022 due to advice from home health nurse from her insurance company.  The nurse had concern for patient's kidney function and advised she present to the emergency department for lab work.  Her BNP was mildly elevated at 338.1.  Her chest x-ray showed a large hiatal hernia with no acute cardiopulmonary findings.  Her creatinine was 1.18 and she was felt to be stable for discharge.  In the cardiology clinic her dyspnea was noted to be stable and her chronic symptoms were reviewed.  She continues to be able to ambulate well as long as she was not rushing.  It was felt that possibly her hiatal hernia was contributing to her increased work of breathing with bending over.  She denied orthopnea and PND.  She continued to use lower extremity compression stockings for chronic lower  extremity edema.  Her weight remains stable.  She continues to wear pessary for vaginal prolapse.  She was being followed by urology.  She did not feel that she was responding as well well to 80 mg of furosemide compared to when it was initially prescribed.  She reported that on days when she took an extra 40 mg furosemide she did have better response with urination.  She denied chest pain palpitations syncope and lightheadedness.    She presented to the clinic 05/02/22 for follow-up evaluation and stated she continued to notice swelling in her ankles.  Her left continued to be worse than her right.  She had been very active over the holidays.  She reported that her son-in-law passed away and she was getting ready to go to his funeral.  She had been trying to monitor her sodium intake.  We reviewed the importance of fluid restriction and daily weights.  She expressed understanding.  I also encouraged her to elevate her lower extremities.  We reviewed her current medication regimen and she reported compliance.  I gave her a weight log, had her increase her physical activity as tolerated, gave salty 6 diet sheet and planned follow-up in 4 to 6 months.  She continue to follow-up with cardiology.  She was admitted 3/25 with chest pain.  Her echocardiogram showed an EF of 20-25%, normal RV function.  She was found to be in A-fib with RVR.  She spontaneously converted to sinus rhythm.  She underwent left and right heart cath which showed pLAD 50% lesion and mildly elevated wedge pressures with moderate pulmonary hypertension and normal cardiac output.  She was diuresed and her GDMT was titrated.  Her discharge weight was 177 pounds.  She followed up with the advanced heart failure clinic 07/27/2023.  During that time she reported that she was feeling fine.  She was using a rolling walker when outside of her house.  She was able to perform her ADLs.  She denied shortness of breath with walking on flat ground with her  rolling walker.  She denied palpitations, bleeding, chest pain, lower extremity swelling orthopnea and PND.  Her weight at home was around 173 pounds.  She reported taking her medications as prescribed.  She was wearing CPAP.  Her husband was  driving her to her appointments.  Cardiac MRI was planned.  Her losartan was stopped.  She was started on Entresto 24/26.  She was continued on furosemide, spironolactone, metoprolol, and Jardiance.  Repeat echocardiogram in 3 months was recommended along with consideration for CRT-D if EF did not improve.  She presents to the clinic today for follow-up evaluation and states***.  Today she denies chest pain, fatigue, palpitations, melena,  hemoptysis, diaphoresis, weakness, presyncope, syncope, orthopnea, and PND.   Chronic systolic congestive heart failure-weight today 18***7 pounds.  Euvolemic.  Echocardiogram during recent admission 3/25 showed an EF of 20-25%.  She underwent right and left heart cath 3/25 which showed no new disease.  It was felt that she had reduction in her EF due to tachycardia mediated cardiomyopathy in the setting of new atrial fibrillation with RVR.  Cardiac MRI has been scheduled.  Stable chronic ankle edema left greater than right stable chronic.   Reviewed echocardiogram 9/23.    Unable to uptitrate GDMT due to blood pressure and renal function. Continue furosemide, Entresto 24/26, metoprolol, spironolactone, Jardiance Heart healthy low-sodium diet Increase physical activity as tolerated Daily weights-contact office with a weight increase of 3 pounds overnight or 5 pounds in 1 week Elevate lower extremities when not active. Will need repeat echocardiogram 6/25  Essential hypertension-BP today 116/***61.  Maintain blood pressure log Continue metoprolol, Entresto Heart healthy low-sodium diet Increase physical activity as tolerated  Coronary artery disease-reassuring cardiac catheterization 3/25.  Details above.  With cardiac  catheterization 07/12/2019 which showed proximal LAD 40% lesion. Continue aspirin, atorvastatin, metoprolol Heart healthy low-sodium diet  Increase physical activity as tolerated  Paroxysmal atrial fibrillation-heart rate today***.  Spontaneously converted to sinus rhythm.  RRR. Order 30-day cardiac event monitor Avoid triggers caffeine, chocolate, EtOH, dehydration etc. Continue Eliquis  OSA-reports compliance with CPAP.  Waking up well rested. Continue CPAP use    Disposition: Follow-up with Dr. Allyson Sabal in  6 months.  Home Medications    Prior to Admission medications   Medication Sig Start Date End Date Taking? Authorizing Provider  ALBUTEROL IN 1 tab    [provider]  allopurinol (ZYLOPRIM) 300 MG tablet Take 300 mg by mouth in the morning. 11/12/20   [provider]  ASPIRIN 81 PO     [provider]  aspirin EC 81 MG tablet Take 162 mg by mouth in the morning. Swallow whole.    [provider]  atorvastatin (LIPITOR) 40 MG tablet Take 1 tablet (40 mg total) by mouth daily at 6 PM. 07/13/19   Meredeth Ide, MD  Biotin 1000 MCG tablet Take 1,000 mcg by mouth in the morning.    [provider]  Calcium Carb-Cholecalciferol (CALCIUM 600+D3 PO) Take 1 tablet by mouth in the morning and at bedtime.    [provider]  cholecalciferol (VITAMIN D) 25 MCG (1000 UNIT) tablet Take 1,000 Units by mouth in the morning and at bedtime.    [provider]  citalopram (CELEXA) 20 MG tablet Take 20 mg by mouth at bedtime.  08/05/11   [provider]  colchicine 0.6 MG tablet Take 0.6 mg by mouth daily as needed (gout flares). 01/20/20   [provider]  cyclobenzaprine (FLEXERIL) 10 MG tablet Take 10 mg by mouth 3 (three) times daily as needed for muscle spasms. 02/21/21   [provider]  cycloSPORINE (RESTASIS) 0.05 % ophthalmic emulsion Place 1 drop into both eyes 2 (two) times daily.    [provider]  ferrous sulfate 325 (65 FE) MG EC tablet Take 325 mg by mouth in the morning.    [provider]  furosemide (LASIX) 80 MG tablet Take 1 tablet (80 mg total) by mouth daily. 07/13/19 02/21/22  Meredeth Ide, MD  Hypromellose (ARTIFICIAL TEARS OP) Place 1 drop into both eyes 3 (three) times daily as needed (dry eyes).    [provider]  losartan (COZAAR) 50 MG tablet Take 1 tablet (50 mg total) by mouth daily. 07/13/19   Meredeth Ide, MD  metoprolol succinate (TOPROL-XL) 25 MG 24 hr tablet TAKE ONE TABLET BY MOUTH ONCE DAILY 08/01/21   Runell Gess, MD  Multiple Vitamin (MULTIVITAMIN WITH MINERALS) TABS tablet Take 1 tablet by mouth in the morning. Centrum Silver (NO IRON)    [provider]  Omega-3 Fatty Acids (FISH OIL PO) Take 1 capsule by mouth in the morning.    [provider]  omeprazole (PRILOSEC) 20 MG capsule Take 20 mg by mouth daily before breakfast.  07/04/19   [provider]  ondansetron (ZOFRAN) 4 MG tablet Take by mouth. 02/21/21   [provider]  oxyCODONE-acetaminophen (PERCOCET/ROXICET) 5-325 MG tablet Take 1 tablet by mouth daily as needed (pain.). 11/08/20   [provider]  polycarbophil (FIBERCON) 625 MG tablet Take 625 mg by mouth in the morning and at bedtime.    [provider]  pramipexole (MIRAPEX) 0.5 MG tablet Take 1 mg by mouth every evening.    [provider]  temazepam (RESTORIL) 15 MG capsule Take 1 capsule (15 mg total) by mouth at bedtime. 12/02/16   Love, Evlyn Kanner, PA-C  Turmeric Curcumin 500 MG CAPS Take 500 mg by mouth at bedtime.     [provider]  vitamin E 180 MG (400 UNITS) capsule Take 400 Units by mouth at bedtime.    [provider]    Family History    Family History  Problem Relation Age of Onset   Congestive Heart Failure Mother    Pancreatic cancer Father    She indicated that her mother is deceased. She indicated that her father is  deceased.  Social History    Social History   Socioeconomic History   Marital status: Married    Spouse name: Social worker   Number of children: 2   Years of education: Not on file   Highest education level: High school graduate  Occupational History   Occupation: Retired  Tobacco Use   Smoking status: Never   Smokeless tobacco: Never   Tobacco comments:    never used tobacco  Vaping Use   Vaping status: Never Used  Substance and Sexual Activity   Alcohol use: No    Alcohol/week: 0.0 standard drinks of alcohol   Drug use: No   Sexual activity: Not Currently  Other Topics Concern   Not on file  Social History Narrative   Not on file   Social Drivers of Health   Financial Resource Strain: Low Risk  (07/17/2023)   Overall Financial Resource Strain (CARDIA)    Difficulty of Paying Living Expenses: Not very hard  Food Insecurity: No Food Insecurity (07/15/2023)   Hunger Vital Sign    Worried About Running Out of Food in the Last Year: Never true    Ran Out of Food in the Last Year: Never true  Transportation Needs: No Transportation Needs (07/17/2023)   PRAPARE - Administrator, Civil Service (Medical): No  Lack of Transportation (Non-Medical): No  Physical Activity: Not on file  Stress: Not on file  Social Connections: Moderately Integrated (07/15/2023)   Social Connection and Isolation Panel [NHANES]    Frequency of Communication with Friends and Family: More than three times a week    Frequency of Social Gatherings with Friends and Family: Twice a week    Attends Religious Services: More than 4 times per year    Active Member of [employer] or Organizations: No    Attends Banker Meetings: Never    Marital Status: Married  Catering manager Violence: Not At Risk (07/15/2023)   Humiliation, Afraid, Rape, and Kick questionnaire    Fear of Current or Ex-Partner: No    Emotionally Abused: No    Physically Abused: No    Sexually Abused: No     Review of  Systems    General:  No chills, fever, night sweats or weight changes.  Cardiovascular:  No chest pain, dyspnea on exertion, edema, orthopnea, palpitations, paroxysmal nocturnal dyspnea. Dermatological: No rash, lesions/masses Respiratory: No cough, dyspnea Urologic: No hematuria, dysuria Abdominal:   No nausea, vomiting, diarrhea, bright red blood per rectum, melena, or hematemesis Neurologic:  No visual changes, wkns, changes in mental status. All other systems reviewed and are otherwise negative except as noted above.  Physical Exam    VS:  There were no vitals taken for this visit. , BMI There is no height or weight on file to calculate BMI. GEN: Well nourished, well developed, in no acute distress. HEENT: normal. Neck: Supple, no JVD, carotid bruits, or masses. Cardiac: RRR, no murmurs, rubs, or gallops. No clubbing, cyanosis, bilateral lower extremity ankle edema left greater than right.  Radials/DP/PT 2+ and equal bilaterally.  Respiratory:  Respirations regular and unlabored, clear to auscultation bilaterally. GI: Soft, nontender, nondistended, BS + x 4. MS: no deformity or atrophy. Skin: warm and dry, no rash. Neuro:  Strength and sensation are intact. Psych: Normal affect.  Accessory Clinical Findings    Recent Labs: 07/16/2023: TSH 3.098 07/17/2023: Magnesium 2.0 07/27/2023: B Natriuretic Peptide 240.8 07/28/2023: ALT 20; BUN 46; Creatinine 1.70; Hemoglobin 13.2; Platelet Count 180; Potassium 4.5; Sodium 137   Recent Lipid Panel    Component Value Date/Time   CHOL 118 07/16/2023 0429   TRIG 50 07/16/2023 0429   HDL 66 07/16/2023 0429   CHOLHDL 1.8 07/16/2023 0429   VLDL 10 07/16/2023 0429   LDLCALC 42 07/16/2023 0429    ECG personally reviewed by me today-none today.  EKG 08/29/2021 sinus bradycardia left bundle branch block 57 bpm- No acute changes  EKG 11/11/2019 Normal sinus rhythm 73 bpm   Echocardiogram 06/20/2019   1. Mild global hypokinesis worse in the  septum.. Left ventricular  ejection fraction, by estimation, is 45 to 50%. The left ventricle has  mildly decreased function. The left ventricle demonstrates global  hypokinesis. The left ventricular internal cavity  size was mildly dilated. There is mild asymmetric left ventricular  hypertrophy. Left ventricular diastolic parameters are consistent with  Grade I diastolic dysfunction (impaired relaxation). Elevated left  ventricular end-diastolic pressure.   2. Right ventricular systolic function is normal. The right ventricular  size is normal. There is normal pulmonary artery systolic pressure.   3. Left atrial size was moderately dilated.   4. The mitral valve is normal in structure and function. Mild mitral  valve regurgitation. No evidence of mitral stenosis.   5. The aortic valve is tricuspid. Aortic valve regurgitation is trivial.  No aortic stenosis is present.   6. The inferior vena cava is normal in size with greater than 50%  respiratory variability, suggesting right atrial pressure of 3 mmHg.   Cardiac catheterization 07/12/2019   Prox LAD lesion is 45% stenosed.   Mild coronary obstructive disease with a smooth area of 40 to 50% proximal LAD stenosis in the region of a large first diagonal vessel.  The remainder of the coronary arteries are angiographically normal including a large ramus intermediate vessel, left circumflex vessel, and dominant RCA.   Mild right heart pressure elevation with PA systolic pressure at 37 and mean pressure 25 mmHg.   LVEDP: 25 mmHg   RECOMMENDATION: Medical therapy for mild CAD involving her proximal LAD with guideline directed medical therapy for LV dysfunction.  Echocardiogram 01/14/2022  IMPRESSIONS     1. Prominent LV dys-synchrony related to LBBB. Left ventricular ejection  fraction, by estimation, is 40 to 45%. The left ventricle has mildly  decreased function. The left ventricle demonstrates global hypokinesis.  Left ventricular  diastolic parameters  are consistent with Grade II diastolic dysfunction (pseudonormalization).   2. Right ventricular systolic function is normal. The right ventricular  size is normal. Tricuspid regurgitation signal is inadequate for assessing  PA pressure.   3. Left atrial size was moderately dilated.   4. The mitral valve is grossly normal. Mild to moderate mitral valve  regurgitation. No evidence of mitral stenosis.   5. The aortic valve is tricuspid. Aortic valve regurgitation is not  visualized. No aortic stenosis is present.   6. The inferior vena cava is normal in size with greater than 50%  respiratory variability, suggesting right atrial pressure of 3 mmHg.   FINDINGS   Left Ventricle: Prominent LV dys-synchrony related to LBBB. Left  ventricular ejection fraction, by estimation, is 40 to 45%. The left  ventricle has mildly decreased function. The left ventricle demonstrates  global hypokinesis. Global longitudinal strain   performed but not reported based on interpreter judgement due to  suboptimal tracking. 3D left ventricular ejection fraction analysis  performed but not reported based on interpreter judgement due to  suboptimal tracking. The left ventricular internal  cavity size was normal in size. There is no left ventricular hypertrophy.  Abnormal (paradoxical) septal motion, consistent with left bundle branch  block. Left ventricular diastolic parameters are consistent with Grade II  diastolic dysfunction  (pseudonormalization).   Right Ventricle: The right ventricular size is normal. No increase in  right ventricular wall thickness. Right ventricular systolic function is  normal. Tricuspid regurgitation signal is inadequate for assessing PA  pressure.   Left Atrium: Left atrial size was moderately dilated.   Right Atrium: Right atrial size was normal in size.   Pericardium: There is no evidence of pericardial effusion.   Mitral Valve: The mitral valve is  grossly normal. Mild to moderate mitral  valve regurgitation. No evidence of mitral valve stenosis.   Tricuspid Valve: The tricuspid valve is grossly normal. Tricuspid valve  regurgitation is trivial. No evidence of tricuspid stenosis.   Aortic Valve: The aortic valve is tricuspid. Aortic valve regurgitation is  not visualized. No aortic stenosis is present.   Pulmonic Valve: The pulmonic valve was grossly normal. Pulmonic valve  regurgitation is trivial. No evidence of pulmonic stenosis.   Aorta: The aortic root and ascending aorta are structurally normal, with  no evidence of dilitation.   Venous: The inferior vena cava is normal in size with greater than 50%  respiratory variability,  suggesting right atrial pressure of 3 mmHg.   IAS/Shunts: The atrial septum is grossly normal.   Echocardiogram 07/15/2023  IMPRESSIONS     1. Left ventricular ejection fraction, by estimation, is 20 to 25%. The  left ventricle has severely decreased function. The left ventricle  demonstrates global hypokinesis. The left ventricular internal cavity size  was moderately dilated. Left  ventricular diastolic parameters are consistent with Grade I diastolic  dysfunction (impaired relaxation).   2. Right ventricular systolic function is normal. The right ventricular  size is normal. There is normal pulmonary artery systolic pressure.   3. Left atrial size was severely dilated.   4. The mitral valve is normal in structure. Mild to moderate mitral valve  regurgitation. No evidence of mitral stenosis.   5. The aortic valve is tricuspid. Aortic valve regurgitation is not  visualized. No aortic stenosis is present.   6. The inferior vena cava is normal in size with greater than 50%  respiratory variability, suggesting right atrial pressure of 3 mmHg.   FINDINGS   Left Ventricle: Left ventricular ejection fraction, by estimation, is 20  to 25%. The left ventricle has severely decreased function. The  left  ventricle demonstrates global hypokinesis. Definity contrast agent was  given IV to delineate the left  ventricular endocardial borders. The left ventricular internal cavity size  was moderately dilated. There is no left ventricular hypertrophy. Left  ventricular diastolic parameters are consistent with Grade I diastolic  dysfunction (impaired relaxation).   Right Ventricle: The right ventricular size is normal. No increase in  right ventricular wall thickness. Right ventricular systolic function is  normal. There is normal pulmonary artery systolic pressure. The tricuspid  regurgitant velocity is 2.41 m/s, and   with an assumed right atrial pressure of 3 mmHg, the estimated right  ventricular systolic pressure is 26.2 mmHg.   Left Atrium: Left atrial size was severely dilated.   Right Atrium: Right atrial size was normal in size.   Pericardium: There is no evidence of pericardial effusion.   Mitral Valve: The mitral valve is normal in structure. There is moderate  thickening of the mitral valve leaflet(s). There is moderate calcification  of the mitral valve leaflet(s). Mild to moderate mitral valve  regurgitation. No evidence of mitral valve  stenosis.   Tricuspid Valve: The tricuspid valve is normal in structure. Tricuspid  valve regurgitation is mild . No evidence of tricuspid stenosis.   Aortic Valve: The aortic valve is tricuspid. Aortic valve regurgitation is  not visualized. No aortic stenosis is present.   Pulmonic Valve: The pulmonic valve was normal in structure. Pulmonic valve  regurgitation is mild. No evidence of pulmonic stenosis.   Aorta: The aortic root is normal in size and structure.   Venous: The inferior vena cava is normal in size with greater than 50%  respiratory variability, suggesting right atrial pressure of 3 mmHg.   IAS/Shunts: No atrial level shunt detected by color flow Doppler.    Cardiac catheterization 07/16/2023    Prox LAD lesion  is 50% stenosed.   1. Moderate proximal LAD stenosis which was described on previous cardiac catheterization in 2021.  No evidence of obstructive disease 2.  Left ventricular angiography was not performed.  EF was severely reduced by echo. 3.  Right heart catheterization showed mildly elevated wedge pressure, moderate pulmonary hypertension and normal cardiac output.   Recommendations: Medical therapy for moderate nonobstructive coronary artery disease and systolic heart failure which seems to be due to nonischemic  cardiomyopathy.  Diagnostic Dominance: Right  Intervention   Assessment & Plan   1. ***  Thomasene Ripple. Ronel Rodeheaver NP-C    08/03/2023, 1:16 PM [facility] [address] Office [phone] Fax [phone]  Notice: This dictation was prepared with Dragon dictation along with smaller phrase technology. Any transcriptional errors that result from this process are unintentional and may not be corrected upon review.  I spent 14*** minutes examining this patient, reviewing medications, and using patient centered shared decision making involving her cardiac care.  Prior to her visit I spent greater than 20 minutes reviewing her past medical history,  medications, and prior cardiac tests.

## 2023-08-06 ENCOUNTER — Ambulatory Visit: Attending: General Practice | Admitting: General Practice

## 2023-08-06 ENCOUNTER — Encounter: Payer: Self-pay | Admitting: General Practice

## 2023-08-06 VITALS — BP 100/50 | HR 74 | Ht 63.0 in | Wt 174.6 lb

## 2023-08-06 DIAGNOSIS — I2510 Atherosclerotic heart disease of native coronary artery without angina pectoris: Secondary | ICD-10-CM

## 2023-08-06 DIAGNOSIS — I10 Essential (primary) hypertension: Secondary | ICD-10-CM | POA: Diagnosis not present

## 2023-08-06 DIAGNOSIS — I5022 Chronic systolic (congestive) heart failure: Secondary | ICD-10-CM

## 2023-08-06 DIAGNOSIS — I480 Paroxysmal atrial fibrillation: Secondary | ICD-10-CM | POA: Diagnosis not present

## 2023-08-06 DIAGNOSIS — G4733 Obstructive sleep apnea (adult) (pediatric): Secondary | ICD-10-CM

## 2023-08-06 NOTE — Patient Instructions (Signed)
 Medication Instructions:  The current medical regimen is effective;  continue present plan and medications as directed. Please refer to the Current Medication list given to you today.  *If you need a refill on your cardiac medications before your next appointment, please call your pharmacy*  Lab Work: NONE  Testing/Procedures: NONE  Follow-Up: At [facility], you and your health needs are our priority.  As part of our continuing mission to provide you with exceptional heart care, our providers are all part of one team.  This team includes your primary Cardiologist (physician) and Advanced Practice Providers or APPs (Physician Assistants and Nurse Practitioners) who all work together to provide you with the care you need, when you need it.  Your next appointment:   4 month(s)  Provider:   Nanetta Batty, MD or Edd Fabian, NP          Other Instructions LOG YOUR WEIGHT DAILY DO NOT USE SALT SUBSTITUTE  INCREASE PHYSICAL ACTIVITY AS TOLERATED PLEASE READ AND FOLLOW ATTACHED  SALTY 6  PLEASE READ AND FOLLOW ATTACHED GERD DIET          1st Floor: - Lobby - Registration  - Pharmacy  - Lab - Cafe  2nd Floor: - PV Lab - Diagnostic Testing (echo, CT, nuclear med)  3rd Floor: - Vacant  4th Floor: - TCTS (cardiothoracic surgery) - AFib Clinic - Structural Heart Clinic - Vascular Surgery  - Vascular Ultrasound  5th Floor: - HeartCare Cardiology (general and EP) - Clinical Pharmacy for coumadin, hypertension, lipid, weight-loss medications, and med management appointments    Valet parking services will be available as well.     GERD in Adults: Diet Changes When you have gastroesophageal reflux disease (GERD), you may need to make changes to your diet. Choosing the right foods can help with your symptoms. Think about working with an expert in healthy eating called a dietitian. They can help you make healthy food choices. What are tips for following this  plan? Reading food labels Look for foods that are low in saturated fat. Foods that may help with your symptoms include: Foods with less than 5% of daily value (DV) of fat. Foods with 0 grams of trans fat. Cooking [employer] in ways that don't use a lot of fat. These ways include: Baking. Steaming. Grilling. Broiling. To add flavor, try to use herbs that are low in spice and acidity. Avoid frying your food. Meal planning  Eat small meals often rather than eating 3 large meals each day. Eat your meals slowly in a place where you feel relaxed. If told by your health care provider, avoid: Foods that cause symptoms. Keep a food diary to keep track of foods that cause symptoms. Alcohol. Drinking a lot of liquid with meals. General instructions For 2-3 hours after you eat, avoid: Bending over. Exercise. Lying down. Chew sugar-free gum after meals. What foods should I eat? Eat a healthy diet. Try to include: Foods with high amounts of fiber. These include: Fruits and vegetables. Whole grains and beans. Low-fat dairy products. Lean meats, fish, and poultry. Egg whites. Foods that cause symptoms in someone else may not cause symptoms for you. Work with your provider to find foods that are safe for you. The items listed above may not be all the foods and drinks you can have. Talk with a dietitian to learn more. The items listed above may not be a complete list of foods and beverages you can eat and drink. Contact a  dietitian for more information. What foods should I avoid? Limiting some of these foods may help with your symptoms. Each person is different. Talk with a dietitian or your provider to help you find the exact foods to avoid. Some of the foods to avoid may include: Fruits Fruits with a lot of acid in them. These may include citrus fruits, such as oranges, grapefruit, pineapple, and lemons. Vegetables Deep-fried vegetables, such as [place] fries. Vegetables, sauces, or  toppings made with added fat and vegetables with acid in them. These may include tomatoes and tomato products, chili peppers, onions, garlic, and horseradish. Grains Pastries or quick breads with added fat. Meats and other proteins High-fat meats, such as fatty beef or pork, hot dogs, ribs, ham, sausage, salami, and bacon. Fried meat or protein, such as fried fish and fried chicken. Egg yolks. Fats and oils Butter. Margarine. Shortening. Ghee. Drinks Coffee and other drinks with caffeine in them. Fizzy and sugary drinks, such as soda and energy drinks. Fruit juice made with acidic fruits, such as orange or grapefruit. Tomato juice. Sweets and desserts Chocolate and cocoa. Donuts. Seasonings and condiments Mint, such as peppermint and spearmint. Condiments, herbs, or seasonings that cause symptoms. These may include curry, hot sauce, or vinegar-based salad dressings. The items listed above may not be all the foods and drinks you should avoid. Talk with a dietitian to learn more. Questions to ask your health care provider Changes to your diet and everyday life are often the first steps taken to manage symptoms of GERD. If these changes don't help, talk with your provider about taking medicines. Where to find more information International Foundation for Gastrointestinal Disorders: aboutgerd.org This information is not intended to replace advice given to you by your health care provider. Make sure you discuss any questions you have with your health care provider. Document Revised: 02/24/2023 Document Reviewed: 09/10/2022 Elsevier Patient Education  2024 [employer].

## 2023-08-11 ENCOUNTER — Encounter: Payer: Self-pay | Admitting: Hematology & Oncology

## 2023-08-11 ENCOUNTER — Other Ambulatory Visit (HOSPITAL_COMMUNITY): Payer: Self-pay

## 2023-08-12 ENCOUNTER — Other Ambulatory Visit (HOSPITAL_COMMUNITY): Payer: Self-pay

## 2023-08-12 ENCOUNTER — Encounter: Payer: Self-pay | Admitting: Pharmacist

## 2023-08-12 ENCOUNTER — Other Ambulatory Visit: Payer: Self-pay

## 2023-08-13 ENCOUNTER — Other Ambulatory Visit (HOSPITAL_COMMUNITY): Payer: Self-pay

## 2023-08-13 ENCOUNTER — Other Ambulatory Visit: Payer: Self-pay

## 2023-08-14 DIAGNOSIS — G4733 Obstructive sleep apnea (adult) (pediatric): Secondary | ICD-10-CM | POA: Diagnosis not present

## 2023-08-17 ENCOUNTER — Other Ambulatory Visit: Payer: Self-pay

## 2023-08-17 ENCOUNTER — Telehealth: Payer: Self-pay | Admitting: Cardiovascular Disease

## 2023-08-17 ENCOUNTER — Other Ambulatory Visit (HOSPITAL_COMMUNITY): Payer: Self-pay

## 2023-08-17 NOTE — Telephone Encounter (Signed)
 Pt c/o medication issue:  1. Name of Medication:  apixaban  (ELIQUIS ) 5 MG TABS tablet  2. How are you currently taking this medication (dosage and times per day)?   3. Are you having a reaction (difficulty breathing--STAT)?   4. What is your medication issue?   Patient says while in the hospital she discussed being put on a patient assistance grant for Eliquis . She says she received a letter informing her that the grant had been approved for $10,000, but she doesn't know how to go about getting the medication. Looks like a prescription was sent to [facility] sure whether it was received. Patient also mentions prescriptions for Jardiance  and Spironolactone .

## 2023-08-18 DIAGNOSIS — I10 Essential (primary) hypertension: Secondary | ICD-10-CM | POA: Diagnosis not present

## 2023-08-18 DIAGNOSIS — G4733 Obstructive sleep apnea (adult) (pediatric): Secondary | ICD-10-CM | POA: Diagnosis not present

## 2023-08-19 ENCOUNTER — Other Ambulatory Visit (HOSPITAL_COMMUNITY): Payer: Self-pay

## 2023-08-25 ENCOUNTER — Other Ambulatory Visit (HOSPITAL_COMMUNITY): Payer: Self-pay

## 2023-08-25 MED ORDER — LOSARTAN POTASSIUM 50 MG PO TABS
50.0000 mg | ORAL_TABLET | Freq: Every day | ORAL | 1 refills | Status: DC
  Filled 2023-09-03 – 2024-02-15 (×4): qty 90, 90d supply, fill #0

## 2023-08-25 MED ORDER — OMEPRAZOLE 40 MG PO CPDR
40.0000 mg | DELAYED_RELEASE_CAPSULE | Freq: Every day | ORAL | 1 refills | Status: DC
  Filled 2023-08-25 – 2023-10-07 (×2): qty 90, 90d supply, fill #0
  Filled 2024-01-18: qty 90, 90d supply, fill #1

## 2023-08-25 MED ORDER — PRAMIPEXOLE DIHYDROCHLORIDE 0.5 MG PO TABS
0.5000 mg | ORAL_TABLET | Freq: Every day | ORAL | 1 refills | Status: DC
Start: 2023-06-16 — End: 2023-12-09
  Filled 2023-09-08 – 2023-09-09 (×2): qty 90, 90d supply, fill #0

## 2023-08-26 ENCOUNTER — Other Ambulatory Visit (HOSPITAL_COMMUNITY): Payer: Self-pay

## 2023-08-26 ENCOUNTER — Other Ambulatory Visit: Payer: Self-pay

## 2023-08-26 MED ORDER — ATORVASTATIN CALCIUM 40 MG PO TABS
40.0000 mg | ORAL_TABLET | Freq: Every evening | ORAL | 1 refills | Status: DC
  Filled 2023-08-26: qty 90, 90d supply, fill #0
  Filled 2023-11-23: qty 90, 90d supply, fill #1

## 2023-08-26 MED ORDER — ALLOPURINOL 300 MG PO TABS
300.0000 mg | ORAL_TABLET | Freq: Every day | ORAL | 1 refills | Status: DC
  Filled 2023-08-26: qty 90, 90d supply, fill #0
  Filled 2023-11-30: qty 90, 90d supply, fill #1

## 2023-08-26 NOTE — Telephone Encounter (Signed)
 Patient identification verified by 2 forms. Sims Duck, RN     Called and spoke to patient. Informed her of pharmacist response. Patient verbalized understanding. No further questions at this time.

## 2023-08-27 ENCOUNTER — Other Ambulatory Visit (HOSPITAL_COMMUNITY): Payer: Self-pay

## 2023-08-27 DIAGNOSIS — I4891 Unspecified atrial fibrillation: Secondary | ICD-10-CM | POA: Diagnosis not present

## 2023-08-27 DIAGNOSIS — I5030 Unspecified diastolic (congestive) heart failure: Secondary | ICD-10-CM | POA: Diagnosis not present

## 2023-08-31 ENCOUNTER — Other Ambulatory Visit (HOSPITAL_COMMUNITY): Payer: Self-pay

## 2023-09-01 ENCOUNTER — Other Ambulatory Visit: Payer: Self-pay

## 2023-09-01 ENCOUNTER — Other Ambulatory Visit (HOSPITAL_COMMUNITY): Payer: Self-pay

## 2023-09-01 MED ORDER — CYCLOSPORINE 0.05 % OP EMUL
1.0000 [drp] | Freq: Two times a day (BID) | OPHTHALMIC | 1 refills | Status: AC
Start: 2023-09-01 — End: ?
  Filled 2023-09-01: qty 60, 30d supply, fill #0
  Filled 2024-02-02 – 2024-02-15 (×3): qty 60, 30d supply, fill #1
  Filled 2024-03-09: qty 60, 30d supply, fill #2

## 2023-09-02 ENCOUNTER — Other Ambulatory Visit (HOSPITAL_COMMUNITY): Payer: Self-pay

## 2023-09-03 ENCOUNTER — Other Ambulatory Visit: Payer: Self-pay

## 2023-09-03 ENCOUNTER — Other Ambulatory Visit (HOSPITAL_COMMUNITY): Payer: Self-pay

## 2023-09-03 MED FILL — Metoprolol Succinate Tab ER 24HR 25 MG (Tartrate Equiv): ORAL | 90 days supply | Qty: 90 | Fill #0 | Status: AC

## 2023-09-04 ENCOUNTER — Other Ambulatory Visit (HOSPITAL_COMMUNITY): Payer: Self-pay | Admitting: Family Medicine

## 2023-09-04 ENCOUNTER — Ambulatory Visit (HOSPITAL_COMMUNITY)
Admission: RE | Admit: 2023-09-04 | Discharge: 2023-09-04 | Disposition: A | Discharge status: Home / Self Care | Source: Ambulatory Visit | Attending: Family Medicine | Admitting: Family Medicine

## 2023-09-04 DIAGNOSIS — I5022 Chronic systolic (congestive) heart failure: Secondary | ICD-10-CM | POA: Insufficient documentation

## 2023-09-04 MED ORDER — GADOBUTROL 1 MMOL/ML IV SOLN
8.0000 mL | Freq: Once | INTRAVENOUS | Status: AC | PRN
  Administered 2023-09-04: 8 mL via INTRAVENOUS

## 2023-09-08 ENCOUNTER — Ambulatory Visit (HOSPITAL_COMMUNITY): Payer: Self-pay

## 2023-09-08 ENCOUNTER — Other Ambulatory Visit (HOSPITAL_COMMUNITY): Payer: Self-pay

## 2023-09-09 ENCOUNTER — Other Ambulatory Visit: Payer: Self-pay

## 2023-09-09 ENCOUNTER — Other Ambulatory Visit (HOSPITAL_COMMUNITY): Payer: Self-pay

## 2023-09-13 DIAGNOSIS — G4733 Obstructive sleep apnea (adult) (pediatric): Secondary | ICD-10-CM | POA: Diagnosis not present

## 2023-09-14 ENCOUNTER — Other Ambulatory Visit (HOSPITAL_COMMUNITY): Payer: Self-pay

## 2023-09-22 ENCOUNTER — Other Ambulatory Visit: Payer: Self-pay

## 2023-09-22 ENCOUNTER — Other Ambulatory Visit (HOSPITAL_COMMUNITY): Payer: Self-pay

## 2023-09-23 ENCOUNTER — Other Ambulatory Visit (HOSPITAL_COMMUNITY): Payer: Self-pay

## 2023-09-23 MED ORDER — TEMAZEPAM 15 MG PO CAPS
15.0000 mg | ORAL_CAPSULE | Freq: Every evening | ORAL | 0 refills | Status: AC
  Filled 2023-09-23 – 2023-09-24 (×2): qty 90, 90d supply, fill #0

## 2023-09-24 ENCOUNTER — Other Ambulatory Visit (HOSPITAL_COMMUNITY): Payer: Self-pay

## 2023-09-27 DIAGNOSIS — I4891 Unspecified atrial fibrillation: Secondary | ICD-10-CM | POA: Diagnosis not present

## 2023-09-27 DIAGNOSIS — I5030 Unspecified diastolic (congestive) heart failure: Secondary | ICD-10-CM | POA: Diagnosis not present

## 2023-09-28 ENCOUNTER — Other Ambulatory Visit (HOSPITAL_COMMUNITY): Payer: Self-pay

## 2023-10-07 ENCOUNTER — Other Ambulatory Visit: Payer: Self-pay

## 2023-10-07 ENCOUNTER — Other Ambulatory Visit (HOSPITAL_COMMUNITY): Payer: Self-pay

## 2023-10-13 DIAGNOSIS — I5043 Acute on chronic combined systolic (congestive) and diastolic (congestive) heart failure: Secondary | ICD-10-CM | POA: Diagnosis not present

## 2023-10-13 DIAGNOSIS — N183 Chronic kidney disease, stage 3 unspecified: Secondary | ICD-10-CM | POA: Diagnosis not present

## 2023-10-13 DIAGNOSIS — J40 Bronchitis, not specified as acute or chronic: Secondary | ICD-10-CM | POA: Diagnosis not present

## 2023-10-13 DIAGNOSIS — I428 Other cardiomyopathies: Secondary | ICD-10-CM | POA: Diagnosis not present

## 2023-10-14 DIAGNOSIS — G4733 Obstructive sleep apnea (adult) (pediatric): Secondary | ICD-10-CM | POA: Diagnosis not present

## 2023-10-16 ENCOUNTER — Other Ambulatory Visit (HOSPITAL_COMMUNITY): Payer: Self-pay

## 2023-10-26 DIAGNOSIS — I10 Essential (primary) hypertension: Secondary | ICD-10-CM | POA: Diagnosis not present

## 2023-10-26 DIAGNOSIS — E7800 Pure hypercholesterolemia, unspecified: Secondary | ICD-10-CM | POA: Diagnosis not present

## 2023-10-26 DIAGNOSIS — N183 Chronic kidney disease, stage 3 unspecified: Secondary | ICD-10-CM | POA: Diagnosis not present

## 2023-10-26 DIAGNOSIS — I428 Other cardiomyopathies: Secondary | ICD-10-CM | POA: Diagnosis not present

## 2023-10-26 DIAGNOSIS — J40 Bronchitis, not specified as acute or chronic: Secondary | ICD-10-CM | POA: Diagnosis not present

## 2023-10-26 DIAGNOSIS — I5043 Acute on chronic combined systolic (congestive) and diastolic (congestive) heart failure: Secondary | ICD-10-CM | POA: Diagnosis not present

## 2023-10-27 ENCOUNTER — Other Ambulatory Visit (HOSPITAL_COMMUNITY): Payer: Self-pay

## 2023-10-27 DIAGNOSIS — I5030 Unspecified diastolic (congestive) heart failure: Secondary | ICD-10-CM | POA: Diagnosis not present

## 2023-10-27 DIAGNOSIS — I4891 Unspecified atrial fibrillation: Secondary | ICD-10-CM | POA: Diagnosis not present

## 2023-11-06 ENCOUNTER — Encounter: Payer: Self-pay | Admitting: Hematology & Oncology

## 2023-11-06 ENCOUNTER — Other Ambulatory Visit (HOSPITAL_COMMUNITY): Payer: Self-pay

## 2023-11-06 ENCOUNTER — Telehealth: Payer: Self-pay | Admitting: Pharmacy Technician

## 2023-11-06 NOTE — Telephone Encounter (Signed)
    I called the patient and told her it should be free. She will let me know if the cost is more than that. She said she paid 47.00 last time

## 2023-11-10 DIAGNOSIS — R400 Somnolence: Secondary | ICD-10-CM | POA: Diagnosis not present

## 2023-11-10 DIAGNOSIS — G4733 Obstructive sleep apnea (adult) (pediatric): Secondary | ICD-10-CM | POA: Diagnosis not present

## 2023-11-11 DIAGNOSIS — J40 Bronchitis, not specified as acute or chronic: Secondary | ICD-10-CM | POA: Diagnosis not present

## 2023-11-11 DIAGNOSIS — I5043 Acute on chronic combined systolic (congestive) and diastolic (congestive) heart failure: Secondary | ICD-10-CM | POA: Diagnosis not present

## 2023-11-11 DIAGNOSIS — I428 Other cardiomyopathies: Secondary | ICD-10-CM | POA: Diagnosis not present

## 2023-11-11 DIAGNOSIS — N183 Chronic kidney disease, stage 3 unspecified: Secondary | ICD-10-CM | POA: Diagnosis not present

## 2023-11-13 DIAGNOSIS — G4733 Obstructive sleep apnea (adult) (pediatric): Secondary | ICD-10-CM | POA: Diagnosis not present

## 2023-11-16 ENCOUNTER — Other Ambulatory Visit (HOSPITAL_COMMUNITY): Payer: Self-pay

## 2023-11-23 ENCOUNTER — Other Ambulatory Visit (HOSPITAL_COMMUNITY): Payer: Self-pay

## 2023-11-25 ENCOUNTER — Other Ambulatory Visit: Payer: Self-pay | Admitting: Family Medicine

## 2023-11-25 DIAGNOSIS — Z1231 Encounter for screening mammogram for malignant neoplasm of breast: Secondary | ICD-10-CM

## 2023-11-26 DIAGNOSIS — J40 Bronchitis, not specified as acute or chronic: Secondary | ICD-10-CM | POA: Diagnosis not present

## 2023-11-26 DIAGNOSIS — N183 Chronic kidney disease, stage 3 unspecified: Secondary | ICD-10-CM | POA: Diagnosis not present

## 2023-11-26 DIAGNOSIS — E7800 Pure hypercholesterolemia, unspecified: Secondary | ICD-10-CM | POA: Diagnosis not present

## 2023-11-26 DIAGNOSIS — I10 Essential (primary) hypertension: Secondary | ICD-10-CM | POA: Diagnosis not present

## 2023-11-26 DIAGNOSIS — I428 Other cardiomyopathies: Secondary | ICD-10-CM | POA: Diagnosis not present

## 2023-11-26 DIAGNOSIS — I5043 Acute on chronic combined systolic (congestive) and diastolic (congestive) heart failure: Secondary | ICD-10-CM | POA: Diagnosis not present

## 2023-11-27 DIAGNOSIS — I5030 Unspecified diastolic (congestive) heart failure: Secondary | ICD-10-CM | POA: Diagnosis not present

## 2023-11-27 DIAGNOSIS — I4891 Unspecified atrial fibrillation: Secondary | ICD-10-CM | POA: Diagnosis not present

## 2023-11-30 ENCOUNTER — Other Ambulatory Visit (HOSPITAL_COMMUNITY): Payer: Self-pay

## 2023-11-30 ENCOUNTER — Other Ambulatory Visit: Payer: Self-pay | Admitting: Cardiovascular Disease

## 2023-12-01 ENCOUNTER — Other Ambulatory Visit (HOSPITAL_COMMUNITY): Payer: Self-pay

## 2023-12-02 DIAGNOSIS — D508 Other iron deficiency anemias: Secondary | ICD-10-CM | POA: Diagnosis not present

## 2023-12-02 DIAGNOSIS — Z0000 Encounter for general adult medical examination without abnormal findings: Secondary | ICD-10-CM | POA: Diagnosis not present

## 2023-12-02 DIAGNOSIS — I480 Paroxysmal atrial fibrillation: Secondary | ICD-10-CM | POA: Diagnosis not present

## 2023-12-02 DIAGNOSIS — Z8739 Personal history of other diseases of the musculoskeletal system and connective tissue: Secondary | ICD-10-CM | POA: Diagnosis not present

## 2023-12-02 DIAGNOSIS — I5022 Chronic systolic (congestive) heart failure: Secondary | ICD-10-CM | POA: Diagnosis not present

## 2023-12-02 DIAGNOSIS — G259 Extrapyramidal and movement disorder, unspecified: Secondary | ICD-10-CM | POA: Diagnosis not present

## 2023-12-02 DIAGNOSIS — E7800 Pure hypercholesterolemia, unspecified: Secondary | ICD-10-CM | POA: Diagnosis not present

## 2023-12-02 DIAGNOSIS — G4700 Insomnia, unspecified: Secondary | ICD-10-CM | POA: Diagnosis not present

## 2023-12-02 DIAGNOSIS — K219 Gastro-esophageal reflux disease without esophagitis: Secondary | ICD-10-CM | POA: Diagnosis not present

## 2023-12-02 DIAGNOSIS — I10 Essential (primary) hypertension: Secondary | ICD-10-CM | POA: Diagnosis not present

## 2023-12-07 ENCOUNTER — Encounter: Payer: Self-pay | Admitting: Cardiovascular Disease

## 2023-12-07 ENCOUNTER — Other Ambulatory Visit (HOSPITAL_COMMUNITY): Payer: Self-pay

## 2023-12-07 ENCOUNTER — Ambulatory Visit: Attending: Cardiovascular Disease | Admitting: Cardiovascular Disease

## 2023-12-07 ENCOUNTER — Other Ambulatory Visit: Payer: Self-pay | Admitting: Cardiovascular Disease

## 2023-12-07 VITALS — BP 102/54 | HR 82 | Ht 62.0 in | Wt 172.6 lb

## 2023-12-07 DIAGNOSIS — I4891 Unspecified atrial fibrillation: Secondary | ICD-10-CM | POA: Diagnosis not present

## 2023-12-07 DIAGNOSIS — E782 Mixed hyperlipidemia: Secondary | ICD-10-CM

## 2023-12-07 DIAGNOSIS — I447 Left bundle-branch block, unspecified: Secondary | ICD-10-CM | POA: Diagnosis not present

## 2023-12-07 DIAGNOSIS — I2510 Atherosclerotic heart disease of native coronary artery without angina pectoris: Secondary | ICD-10-CM | POA: Diagnosis not present

## 2023-12-07 DIAGNOSIS — I10 Essential (primary) hypertension: Secondary | ICD-10-CM | POA: Diagnosis not present

## 2023-12-07 DIAGNOSIS — I5042 Chronic combined systolic (congestive) and diastolic (congestive) heart failure: Secondary | ICD-10-CM

## 2023-12-07 DIAGNOSIS — R600 Localized edema: Secondary | ICD-10-CM | POA: Diagnosis not present

## 2023-12-07 NOTE — Assessment & Plan Note (Signed)
 Chronic

## 2023-12-07 NOTE — Assessment & Plan Note (Signed)
 History of minimal nonobstructive CAD by cath recently performed by Dr. Wendel 07/16/2023 revealing at most to 50% proximal LAD stenosis unchanged from prior cath performed in 2021.

## 2023-12-07 NOTE — Assessment & Plan Note (Signed)
 No edema on exam today.  She is on a diuretic.

## 2023-12-07 NOTE — Progress Notes (Signed)
 12/07/2023 [name]   05-16-1940  [id number]  Primary Physician Arloa Elsie SAUNDERS, MD Primary Cardiologist: Dorn JINNY Lesches MD GENI C[place], [place]  HPI:  [name] is a 83 y.o.  moderately overweight married Caucasian female mother of 2 children, grandmother of 6 grandchildren referred by Dr. Arloa for evaluation of dyspnea on exertion.  She is retired from being in accounts payable in data entry.  I last saw her in the office 04/08/2023. Her risk factors include treated hypertension and mild untreated hyperlipidemia.  One of her sisters did have CABG.  There is a question that she has had a remote stroke.  She is never had a heart attack.  She denies chest pain.  She does have GERD.  She has had right lower extremity DVT 10 years ago complicated by pulmonary embolism on Xarelto .  She says over the last several years she said progressive dyspnea on exertion.   She was admitted to the hospital 07/09/2019 for 4 days and heart failure.  She underwent right and left heart cath by Dr. Burnard revealing almost 40% proximal LAD lesion with elevated right atrial pressure, pulmonary artery pressure, wedge pressure and LVEDP.  She was diuresed and currently weighs 172 pounds down from 188 pounds on 06/03/2019.  She is aware of salt restriction.  She is on furosemide  80 mg a day and currently denies shortness of breath.   She does have a history of thrombophilia with prothrombin gene mutation.  She is had DVT in the past as well as pulmonary emboli.  She has been on Xarelto  several times in the past most recently 2 years ago.  She had successful right total knee replacement by Dr. Baird.     Since I saw her in the office 8 months ago she was hospitalized for 3 days 07/15/2023 with chest pain and heart failure.  She was in A-fib with RVR.  She converted to sinus rhythm.  Right left heart cath performed by Dr. Wendel revealed a 50% proximal LAD lesion unchanged from the prior cath in 2021.  Echo  revealed EF of 20 to 25%.  She is on GDMT.  She does have a chronic left bundle branch block and was seen by Dr. Cindie during her hospitalization who raised the possibility of CRT device.  Cardiac MRI was performed in anticipation of this.  Given her age and relative lack of symptoms on optimal medical therapy we may want to continue to watch her.   Current Meds  Medication Sig   allopurinol  (ZYLOPRIM ) 300 MG tablet Take 300 mg by mouth in the morning.   apixaban  (ELIQUIS ) 5 MG TABS tablet Take 1 tablet (5 mg total) by mouth 2 (two) times daily.   atorvastatin  (LIPITOR) 40 MG tablet Take 1 tablet (40 mg total) by mouth daily at 6 PM.   Biotin  1000 MCG tablet Take 1,000 mcg by mouth in the morning.   Calcium  Carb-Cholecalciferol  (CALCIUM  600+D3 PO) Take 1 tablet by mouth in the morning and at bedtime.   cholecalciferol  (VITAMIN D ) 25 MCG (1000 UNIT) tablet Take 1,000 Units by mouth in the morning and at bedtime.   empagliflozin  (JARDIANCE ) 10 MG TABS tablet Take 1 tablet (10 mg total) by mouth daily.   ferrous sulfate  325 (65 FE) MG EC tablet Take 325 mg by mouth at bedtime.   furosemide  (LASIX ) 80 MG tablet Take 1 tablet (80 mg total) by mouth daily. (Patient taking differently: Take 80 mg by  mouth in the morning.)   Hypromellose (ARTIFICIAL TEARS OP) Place 1 drop into both eyes 3 (three) times daily as needed (dry eyes).   metoprolol  succinate (TOPROL -XL) 25 MG 24 hr tablet Take 1 tablet by mouth once daily   Multiple Vitamin (MULTIVITAMIN WITH MINERALS) TABS tablet Take 1 tablet by mouth in the morning. Centrum Silver (NO IRON)   Omega-3 Fatty Acids (FISH OIL  PO) Take 1 capsule by mouth in the morning.   omeprazole  (PRILOSEC) 40 MG capsule Take 40 mg by mouth in the morning.   omeprazole  (PRILOSEC) 40 MG capsule Take 1 capsule (40 mg total) by mouth daily.   polycarbophil (FIBERCON) 625 MG tablet Take 625 mg by mouth in the morning and at bedtime.   pramipexole  (MIRAPEX ) 0.5 MG tablet Take 1  tablet (0.5 mg total) by mouth daily.   sacubitril -valsartan  (ENTRESTO ) 24-26 MG Take 1 tablet by mouth 2 (two) times daily.   spironolactone  (ALDACTONE ) 25 MG tablet Take 1 tablet (25 mg total) by mouth daily.   temazepam  (RESTORIL ) 15 MG capsule Take 1 capsule (15 mg total) by mouth at bedtime.   Turmeric Curcumin 500 MG CAPS Take 500 mg by mouth at bedtime.    vitamin E 180 MG (400 UNITS) capsule Take 400 Units by mouth at bedtime.     Allergies  Allergen Reactions   Morphine  And Codeine Other (See Comments)    LARGER DOSES OF MORPHINE  CAUSES BODY TWITCHING   Penicillins Anaphylaxis    Childhood reaction Has patient had a PCN reaction causing immediate rash, facial/tongue/throat swelling, SOB or lightheadedness with hypotension: Yes Has patient had a PCN reaction causing severe rash involving mucus membranes or skin necrosis: Unknown Has patient had a PCN reaction that required hospitalization: No Has patient had a PCN reaction occurring within the last 10 years: no (5 or 83 yrs old) If all of the above answers are NO, then may proceed with Cephalosporin use.    Baclofen Other (See Comments)    Other reaction(s): Unknown   Celecoxib Other (See Comments)    Other reaction(s): Unknown   Doxycycline  Other (See Comments)   Levofloxacin Other (See Comments)    Other reaction(s): abnl taste   Penicillin G Benzathine Other (See Comments)   Pneumococcal Vac Polyvalent Other (See Comments)    Other reaction(s): local reaction   Sulfa Antibiotics Other (See Comments)    Unknown childhood reaction   Sulfamethoxazole Other (See Comments)   Tetanus Toxoid, Adsorbed Other (See Comments)    Other reaction(s): local reaction    Social History   Socioeconomic History   Marital status: Married    Spouse name: Social worker   Number of children: 2   Years of education: Not on file   Highest education level: High school graduate  Occupational History   Occupation: Retired  Tobacco Use    Smoking status: Never   Smokeless tobacco: Never   Tobacco comments:    never used tobacco  Vaping Use   Vaping status: Never Used  Substance and Sexual Activity   Alcohol  use: No    Alcohol /week: 0.0 standard drinks of alcohol    Drug use: No   Sexual activity: Not Currently  Other Topics Concern   Not on file  Social History Narrative   Not on file   Social Drivers of Health   Financial Resource Strain: Low Risk  (07/17/2023)   Overall Financial Resource Strain (CARDIA)    Difficulty of Paying Living Expenses: Not very hard  Food Insecurity:  No Food Insecurity (07/15/2023)   Hunger Vital Sign    Worried About Running Out of Food in the Last Year: Never true    Ran Out of Food in the Last Year: Never true  Transportation Needs: No Transportation Needs (07/17/2023)   PRAPARE - Administrator, Civil Service (Medical): No    Lack of Transportation (Non-Medical): No  Physical Activity: Not on file  Stress: Not on file  Social Connections: Moderately Integrated (07/15/2023)   Social Connection and Isolation Panel    Frequency of Communication with Friends and Family: More than three times a week    Frequency of Social Gatherings with Friends and Family: Twice a week    Attends Religious Services: More than 4 times per year    Active Member of [employer] or Organizations: No    Attends Banker Meetings: Never    Marital Status: Married  Catering manager Violence: Not At Risk (07/15/2023)   Humiliation, Afraid, Rape, and Kick questionnaire    Fear of Current or Ex-Partner: No    Emotionally Abused: No    Physically Abused: No    Sexually Abused: No     Review of Systems: General: negative for chills, fever, night sweats or weight changes.  Cardiovascular: negative for chest pain, dyspnea on exertion, edema, orthopnea, palpitations, paroxysmal nocturnal dyspnea or shortness of breath Dermatological: negative for rash Respiratory: negative for cough or  wheezing Urologic: negative for hematuria Abdominal: negative for nausea, vomiting, diarrhea, bright red blood per rectum, melena, or hematemesis Neurologic: negative for visual changes, syncope, or dizziness All other systems reviewed and are otherwise negative except as noted above.    Blood pressure (!) 102/54, pulse 82, height 5' 2 (1.575 m), weight 172 lb 9.6 oz (78.3 kg), SpO2 96%.  General appearance: alert and no distress Neck: no adenopathy, no carotid bruit, no JVD, supple, symmetrical, trachea midline, and thyroid  not enlarged, symmetric, no tenderness/mass/nodules Lungs: clear to auscultation bilaterally Heart: regular rate and rhythm, S1, S2 normal, no murmur, click, rub or gallop Extremities: extremities normal, atraumatic, no cyanosis or edema Pulses: 2+ and symmetric Skin: Skin color, texture, turgor normal. No rashes or lesions Neurologic: Grossly normal  EKG not performed today      ASSESSMENT AND PLAN:   HTN (hypertension) History of essential hypertension with blood pressure measured today at 102/54.  She is on metoprolol  and Entresto .  Left bundle branch block Chronic  Bilateral lower extremity edema No edema on exam today.  She is on a diuretic.  CHF (congestive heart failure) (HCC) History of severe LV dysfunction with 2D echo performed 07/15/2023 revealing an EF of 20 to 25% with mild to moderate MR.  She does have left bundle branch block.  She is on GDMT.  She is relatively asymptomatic.  She was seen by Dr. Cindie who raised the question of CRT.  She did have an MRI in anticipation of this.  Given her age and lack of symptoms I am wondering whether we should just continue to treat her medically.  Hyperlipidemia History of hyperlipidemia on statin therapy with lipid profile performed 07/16/2023 revealing total cholesterol 118, LDL 42 and HDL 66.  CAD (coronary artery disease) History of minimal nonobstructive CAD by cath recently performed by Dr.  Wendel 07/16/2023 revealing at most to 50% proximal LAD stenosis unchanged from prior cath performed in 2021.  Atrial fibrillation with rapid ventricular response (HCC) History of PAF currently in sinus rhythm on Eliquis  oral anticoagulation.  Dorn DOROTHA Lesches MD FACP,FACC,FAHA, [facility] 12/07/2023 2:23 PM

## 2023-12-07 NOTE — Assessment & Plan Note (Signed)
 History of severe LV dysfunction with 2D echo performed 07/15/2023 revealing an EF of 20 to 25% with mild to moderate MR.  She does have left bundle branch block.  She is on GDMT.  She is relatively asymptomatic.  She was seen by Dr. Cindie who raised the question of CRT.  She did have an MRI in anticipation of this.  Given her age and lack of symptoms I am wondering whether we should just continue to treat her medically.

## 2023-12-07 NOTE — Assessment & Plan Note (Signed)
 History of essential hypertension with blood pressure measured today at 102/54.  She is on metoprolol  and Entresto .

## 2023-12-07 NOTE — Patient Instructions (Signed)
 Medication Instructions:  Your physician recommends that you continue on your current medications as directed. Please refer to the Current Medication list given to you today.  *If you need a refill on your cardiac medications before your next appointment, please call your pharmacy*   Follow-Up: At [facility], you and your health needs are our priority.  As part of our continuing mission to provide you with exceptional heart care, our providers are all part of one team.  This team includes your primary Cardiologist (physician) and Advanced Practice Providers or APPs (Physician Assistants and Nurse Practitioners) who all work together to provide you with the care you need, when you need it.  Your next appointment:   3 month(s)  Provider:   Jon Hails, PA-C, Lum Louis, NP, Aline Door, PA-C, Kathleen Johnson, PA-C, Hao Meng, PA-C, Damien Braver, NP, or Katlyn West, NP         Then, Dorn Lesches, MD will plan to see you again in 12 month(s).   We recommend signing up for the patient portal called MyChart.  Sign up information is provided on this After Visit Summary.  MyChart is used to connect with patients for Virtual Visits (Telemedicine).  Patients are able to view lab/test results, encounter notes, upcoming appointments, etc.  Non-urgent messages can be sent to your provider as well.   To learn more about what you can do with MyChart, go to [email or web address].

## 2023-12-07 NOTE — Assessment & Plan Note (Signed)
 History of hyperlipidemia on statin therapy with lipid profile performed 07/16/2023 revealing total cholesterol 118, LDL 42 and HDL 66.

## 2023-12-07 NOTE — Assessment & Plan Note (Signed)
 History of PAF currently in sinus rhythm on Eliquis  oral anticoagulation.

## 2023-12-08 ENCOUNTER — Other Ambulatory Visit (HOSPITAL_COMMUNITY): Payer: Self-pay

## 2023-12-08 ENCOUNTER — Other Ambulatory Visit: Payer: Self-pay

## 2023-12-08 DIAGNOSIS — G4733 Obstructive sleep apnea (adult) (pediatric): Secondary | ICD-10-CM | POA: Diagnosis not present

## 2023-12-08 MED ORDER — METOPROLOL SUCCINATE ER 25 MG PO TB24
25.0000 mg | ORAL_TABLET | Freq: Every day | ORAL | 3 refills | Status: AC
  Filled 2023-12-08: qty 90, 90d supply, fill #0
  Filled 2024-02-29: qty 90, 90d supply, fill #1
  Filled 2024-05-29: qty 90, 90d supply, fill #2

## 2023-12-09 ENCOUNTER — Other Ambulatory Visit (HOSPITAL_COMMUNITY): Payer: Self-pay

## 2023-12-09 MED ORDER — PRAMIPEXOLE DIHYDROCHLORIDE 0.5 MG PO TABS
0.5000 mg | ORAL_TABLET | Freq: Every day | ORAL | 1 refills | Status: DC
  Filled 2023-12-09: qty 90, 90d supply, fill #0
  Filled 2024-02-24: qty 90, 90d supply, fill #1

## 2023-12-11 ENCOUNTER — Other Ambulatory Visit (HOSPITAL_COMMUNITY): Payer: Self-pay

## 2023-12-11 DIAGNOSIS — J40 Bronchitis, not specified as acute or chronic: Secondary | ICD-10-CM | POA: Diagnosis not present

## 2023-12-11 DIAGNOSIS — I5043 Acute on chronic combined systolic (congestive) and diastolic (congestive) heart failure: Secondary | ICD-10-CM | POA: Diagnosis not present

## 2023-12-11 DIAGNOSIS — I428 Other cardiomyopathies: Secondary | ICD-10-CM | POA: Diagnosis not present

## 2023-12-11 DIAGNOSIS — N183 Chronic kidney disease, stage 3 unspecified: Secondary | ICD-10-CM | POA: Diagnosis not present

## 2023-12-14 DIAGNOSIS — G4733 Obstructive sleep apnea (adult) (pediatric): Secondary | ICD-10-CM | POA: Diagnosis not present

## 2023-12-16 ENCOUNTER — Other Ambulatory Visit (HOSPITAL_COMMUNITY): Payer: Self-pay

## 2023-12-16 MED ORDER — TEMAZEPAM 15 MG PO CAPS
15.0000 mg | ORAL_CAPSULE | Freq: Every day | ORAL | 0 refills | Status: DC
  Filled 2023-12-16 – 2023-12-22 (×2): qty 90, 90d supply, fill #0

## 2023-12-17 ENCOUNTER — Other Ambulatory Visit (HOSPITAL_COMMUNITY): Payer: Self-pay

## 2023-12-22 ENCOUNTER — Other Ambulatory Visit (HOSPITAL_COMMUNITY): Payer: Self-pay

## 2023-12-24 ENCOUNTER — Other Ambulatory Visit: Payer: PPO

## 2023-12-27 DIAGNOSIS — N183 Chronic kidney disease, stage 3 unspecified: Secondary | ICD-10-CM | POA: Diagnosis not present

## 2023-12-27 DIAGNOSIS — I5043 Acute on chronic combined systolic (congestive) and diastolic (congestive) heart failure: Secondary | ICD-10-CM | POA: Diagnosis not present

## 2023-12-27 DIAGNOSIS — E7800 Pure hypercholesterolemia, unspecified: Secondary | ICD-10-CM | POA: Diagnosis not present

## 2023-12-27 DIAGNOSIS — I10 Essential (primary) hypertension: Secondary | ICD-10-CM | POA: Diagnosis not present

## 2023-12-27 DIAGNOSIS — J40 Bronchitis, not specified as acute or chronic: Secondary | ICD-10-CM | POA: Diagnosis not present

## 2023-12-27 DIAGNOSIS — I428 Other cardiomyopathies: Secondary | ICD-10-CM | POA: Diagnosis not present

## 2023-12-28 DIAGNOSIS — I5030 Unspecified diastolic (congestive) heart failure: Secondary | ICD-10-CM | POA: Diagnosis not present

## 2023-12-28 DIAGNOSIS — I4891 Unspecified atrial fibrillation: Secondary | ICD-10-CM | POA: Diagnosis not present

## 2023-12-29 ENCOUNTER — Ambulatory Visit
Admission: RE | Admit: 2023-12-29 | Discharge: 2023-12-29 | Disposition: A | Discharge status: Home / Self Care | Source: Ambulatory Visit | Attending: Family Medicine | Admitting: Family Medicine

## 2023-12-29 DIAGNOSIS — Z1231 Encounter for screening mammogram for malignant neoplasm of breast: Secondary | ICD-10-CM

## 2023-12-30 ENCOUNTER — Other Ambulatory Visit (HOSPITAL_COMMUNITY): Payer: Self-pay

## 2024-01-10 DIAGNOSIS — I5043 Acute on chronic combined systolic (congestive) and diastolic (congestive) heart failure: Secondary | ICD-10-CM | POA: Diagnosis not present

## 2024-01-10 DIAGNOSIS — J40 Bronchitis, not specified as acute or chronic: Secondary | ICD-10-CM | POA: Diagnosis not present

## 2024-01-10 DIAGNOSIS — I428 Other cardiomyopathies: Secondary | ICD-10-CM | POA: Diagnosis not present

## 2024-01-10 DIAGNOSIS — N183 Chronic kidney disease, stage 3 unspecified: Secondary | ICD-10-CM | POA: Diagnosis not present

## 2024-01-11 ENCOUNTER — Telehealth: Payer: Self-pay | Admitting: Cardiovascular Disease

## 2024-01-11 NOTE — Telephone Encounter (Signed)
 Returned patient's call regarding nosebleeds.  Patient reports she is currently on eliquis  5 mg BID. She states 1 month ago she had a bad nosebleed. She had a hard time getting it to stop but was able to get it under control without seeking medical assistance.   She reports that on Friday 01/08/24, she had a nosebleed that was not as bad as the one 1 month ago. It took her about 10 mins to get it under control. Today she had another nosebleed which took about 10 mins to get under control. She reports she had to pinch her nose, lean forward and apply nasal cotton foam strips to stop the bleeding. She is wondering if her medication can be adjusted. She denies lightheadedness or dizziness.

## 2024-01-11 NOTE — Telephone Encounter (Signed)
  Pt c/o medication issue:  1. Name of Medication: apixaban  (ELIQUIS ) 5 MG TABS tablet   2. How are you currently taking this medication (dosage and times per day)?   Take 1 tablet (5 mg total) by mouth 2 (two) times daily.    3. Are you having a reaction (difficulty breathing--STAT)?  Nose bleeds  4. What is your medication issue?  Patient states she is having nose bleeds and wasn't sure if she should continue the medication or if it needs to be adjusted

## 2024-01-13 ENCOUNTER — Other Ambulatory Visit (HOSPITAL_COMMUNITY): Payer: Self-pay

## 2024-01-13 ENCOUNTER — Other Ambulatory Visit: Payer: Self-pay | Admitting: Student

## 2024-01-13 ENCOUNTER — Other Ambulatory Visit: Payer: Self-pay

## 2024-01-13 DIAGNOSIS — I4891 Unspecified atrial fibrillation: Secondary | ICD-10-CM

## 2024-01-13 MED ORDER — APIXABAN 5 MG PO TABS
5.0000 mg | ORAL_TABLET | Freq: Two times a day (BID) | ORAL | 5 refills | Status: DC
  Filled 2024-01-13: qty 60, 30d supply, fill #0
  Filled 2024-02-08 – 2024-02-15 (×2): qty 60, 30d supply, fill #1
  Filled 2024-03-07 – 2024-03-09 (×2): qty 60, 30d supply, fill #2

## 2024-01-14 DIAGNOSIS — G4733 Obstructive sleep apnea (adult) (pediatric): Secondary | ICD-10-CM | POA: Diagnosis not present

## 2024-01-18 ENCOUNTER — Other Ambulatory Visit: Payer: Self-pay | Admitting: Student

## 2024-01-18 ENCOUNTER — Other Ambulatory Visit (HOSPITAL_COMMUNITY): Payer: Self-pay

## 2024-01-18 MED ORDER — EMPAGLIFLOZIN 10 MG PO TABS
10.0000 mg | ORAL_TABLET | Freq: Every day | ORAL | 5 refills | Status: AC
  Filled 2024-01-18: qty 30, 30d supply, fill #0
  Filled 2024-02-10 – 2024-02-15 (×2): qty 30, 30d supply, fill #1
  Filled 2024-03-11: qty 30, 30d supply, fill #2
  Filled 2024-04-10: qty 30, 30d supply, fill #3
  Filled 2024-05-08: qty 30, 30d supply, fill #4
  Filled 2024-06-03: qty 30, 30d supply, fill #5

## 2024-01-23 ENCOUNTER — Other Ambulatory Visit: Payer: Self-pay | Admitting: Student

## 2024-01-25 ENCOUNTER — Other Ambulatory Visit (HOSPITAL_COMMUNITY): Payer: Self-pay

## 2024-01-25 MED ORDER — SPIRONOLACTONE 25 MG PO TABS
25.0000 mg | ORAL_TABLET | Freq: Every day | ORAL | 3 refills | Status: DC
  Filled 2024-02-01: qty 90, 90d supply, fill #0

## 2024-01-26 ENCOUNTER — Inpatient Hospital Stay: Attending: Family

## 2024-01-26 ENCOUNTER — Other Ambulatory Visit: Payer: Self-pay

## 2024-01-26 ENCOUNTER — Encounter: Payer: Self-pay | Admitting: Hematology & Oncology

## 2024-01-26 ENCOUNTER — Other Ambulatory Visit: Payer: Self-pay | Admitting: *Deleted

## 2024-01-26 ENCOUNTER — Ambulatory Visit: Admitting: Family

## 2024-01-26 ENCOUNTER — Inpatient Hospital Stay: Admitting: Hematology & Oncology

## 2024-01-26 VITALS — BP 105/58 | HR 69 | Temp 97.7°F | Resp 19 | Ht 63.0 in | Wt 172.0 lb

## 2024-01-26 DIAGNOSIS — D500 Iron deficiency anemia secondary to blood loss (chronic): Secondary | ICD-10-CM

## 2024-01-26 DIAGNOSIS — Z79899 Other long term (current) drug therapy: Secondary | ICD-10-CM | POA: Diagnosis not present

## 2024-01-26 DIAGNOSIS — I82501 Chronic embolism and thrombosis of unspecified deep veins of right lower extremity: Secondary | ICD-10-CM | POA: Insufficient documentation

## 2024-01-26 DIAGNOSIS — I428 Other cardiomyopathies: Secondary | ICD-10-CM | POA: Diagnosis not present

## 2024-01-26 DIAGNOSIS — Z7901 Long term (current) use of anticoagulants: Secondary | ICD-10-CM | POA: Diagnosis not present

## 2024-01-26 DIAGNOSIS — I509 Heart failure, unspecified: Secondary | ICD-10-CM | POA: Insufficient documentation

## 2024-01-26 DIAGNOSIS — R400 Somnolence: Secondary | ICD-10-CM | POA: Diagnosis not present

## 2024-01-26 DIAGNOSIS — E7800 Pure hypercholesterolemia, unspecified: Secondary | ICD-10-CM | POA: Diagnosis not present

## 2024-01-26 DIAGNOSIS — D696 Thrombocytopenia, unspecified: Secondary | ICD-10-CM | POA: Diagnosis not present

## 2024-01-26 DIAGNOSIS — I4891 Unspecified atrial fibrillation: Secondary | ICD-10-CM | POA: Diagnosis not present

## 2024-01-26 DIAGNOSIS — I10 Essential (primary) hypertension: Secondary | ICD-10-CM | POA: Diagnosis not present

## 2024-01-26 DIAGNOSIS — N183 Chronic kidney disease, stage 3 unspecified: Secondary | ICD-10-CM | POA: Diagnosis not present

## 2024-01-26 DIAGNOSIS — G4733 Obstructive sleep apnea (adult) (pediatric): Secondary | ICD-10-CM | POA: Diagnosis not present

## 2024-01-26 DIAGNOSIS — J40 Bronchitis, not specified as acute or chronic: Secondary | ICD-10-CM | POA: Diagnosis not present

## 2024-01-26 DIAGNOSIS — I5043 Acute on chronic combined systolic (congestive) and diastolic (congestive) heart failure: Secondary | ICD-10-CM | POA: Diagnosis not present

## 2024-01-26 DIAGNOSIS — I82401 Acute embolism and thrombosis of unspecified deep veins of right lower extremity: Secondary | ICD-10-CM

## 2024-01-26 LAB — CBC WITH DIFFERENTIAL (CANCER CENTER ONLY)
Abs Immature Granulocytes: 0.01 K/uL (ref 0.00–0.07)
Basophils Absolute: 0 K/uL (ref 0.0–0.1)
Basophils Relative: 1 %
Eosinophils Absolute: 0.2 K/uL (ref 0.0–0.5)
Eosinophils Relative: 3 %
HCT: 40.5 % (ref 36.0–46.0)
Hemoglobin: 13.3 g/dL (ref 12.0–15.0)
Immature Granulocytes: 0 %
Lymphocytes Relative: 27 %
Lymphs Abs: 1.6 K/uL (ref 0.7–4.0)
MCH: 30.8 pg (ref 26.0–34.0)
MCHC: 32.8 g/dL (ref 30.0–36.0)
MCV: 93.8 fL (ref 80.0–100.0)
Monocytes Absolute: 0.4 K/uL (ref 0.1–1.0)
Monocytes Relative: 7 %
Neutro Abs: 3.6 K/uL (ref 1.7–7.7)
Neutrophils Relative %: 62 %
Platelet Count: 42 K/uL — ABNORMAL LOW (ref 150–400)
RBC: 4.32 MIL/uL (ref 3.87–5.11)
RDW: 14 % (ref 11.5–15.5)
WBC Count: 5.9 K/uL (ref 4.0–10.5)
nRBC: 0 % (ref 0.0–0.2)

## 2024-01-26 LAB — CMP (CANCER CENTER ONLY)
ALT: 21 U/L (ref 0–44)
AST: 37 U/L (ref 15–41)
Albumin: 4.3 g/dL (ref 3.5–5.0)
Alkaline Phosphatase: 111 U/L (ref 38–126)
Anion gap: 14 (ref 5–15)
BUN: 57 mg/dL — ABNORMAL HIGH (ref 8–23)
CO2: 25 mmol/L (ref 22–32)
Calcium: 10.2 mg/dL (ref 8.9–10.3)
Chloride: 100 mmol/L (ref 98–111)
Creatinine: 2.21 mg/dL — ABNORMAL HIGH (ref 0.44–1.00)
GFR, Estimated: 21 mL/min — ABNORMAL LOW (ref >60)
Glucose, Bld: 112 mg/dL — ABNORMAL HIGH (ref 70–99)
Potassium: 5.2 mmol/L — ABNORMAL HIGH (ref 3.5–5.1)
Sodium: 139 mmol/L (ref 135–145)
Total Bilirubin: 0.6 mg/dL (ref 0.0–1.2)
Total Protein: 7.3 g/dL (ref 6.5–8.1)

## 2024-01-26 LAB — RETICULOCYTES
Immature Retic Fract: 8.2 % (ref 2.3–15.9)
RBC.: 4.2 MIL/uL (ref 3.87–5.11)
Retic Count, Absolute: 52.5 K/uL (ref 19.0–186.0)
Retic Ct Pct: 1.3 % (ref 0.4–3.1)

## 2024-01-26 LAB — IRON AND IRON BINDING CAPACITY (CC-WL,HP ONLY)
Iron: 134 ug/dL (ref 28–170)
Saturation Ratios: 52 % — ABNORMAL HIGH (ref 10.4–31.8)
TIBC: 260 ug/dL (ref 250–450)
UIBC: 126 ug/dL

## 2024-01-26 LAB — FERRITIN: Ferritin: 199 ng/mL (ref 11–307)

## 2024-01-26 LAB — SAVE SMEAR(SSMR), FOR PROVIDER SLIDE REVIEW

## 2024-01-26 NOTE — Progress Notes (Signed)
 Hematology and Oncology Follow Up Visit  [name] [id number] 1941-02-10 83 y.o. 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 is here today for follow-up.  Surprising, I think we have a new problem now.  We last saw her back in the April.  She has 4 heart function.  She has congestive heart failure.  She is on Entresto .  She is on multiple other medications.  She is on Eliquis .  More we got her labs today, her platelet count was only 42,000.  I am unsure as to why her platelet count was so low.  The last time that we checked her, her platelet count was 180,000.  She has a normal white blood cell count.  She is not anemic.  Her renal function is also worsening.  When we saw her today, her BUN was 57 creatinine 2.21.  Again, back in April her BUN is 46 creatinine 1.7.  Again, I am not sure as to why her platelet count is so low.  She has had no bleeding.  She said that she had a couple nosebleeds last week.  She continues on the Eliquis .  For right now, I probably can keep her on the Eliquis  given her heart failure and history of atrial fibrillation.  She has had no problems with breathing.  There is been no exposures to COVID.  She has had no obvious change in bowel or bladder habits..  She does have a little bit of leg swelling, which is chronic.  She has had no fever.  She has had no recent colds.  She has had no headache.  Currently, I would have to say that her performance status is by ECOG 2.    Medications:  Allergies as of 01/26/2024       Reactions   Morphine  And Codeine Other (See Comments)   LARGER DOSES OF MORPHINE  CAUSES BODY TWITCHING   Penicillins Anaphylaxis    Childhood reaction Has patient had a PCN reaction causing immediate rash, facial/tongue/throat swelling, SOB or lightheadedness with hypotension: Yes Has patient had a PCN reaction causing severe rash involving mucus membranes or skin necrosis: Unknown Has patient had a PCN reaction that required hospitalization: No Has patient had a PCN reaction occurring within the last 10 years: no (5 or 83 yrs old) If all of the above answers are NO, then may proceed with Cephalosporin use.   Baclofen Other (See Comments)   Other reaction(s): Unknown   Celecoxib Other (See Comments)   Other reaction(s): Unknown   Doxycycline  Other (See Comments)   Levofloxacin Other (See Comments)   Other reaction(s): abnl taste   Penicillin G Benzathine Other (See Comments)   Pneumococcal Vac Polyvalent Other (See Comments)   Other reaction(s): local reaction   Sulfa Antibiotics Other (See Comments)   Unknown childhood reaction   Sulfamethoxazole Other (See Comments)   Tetanus Toxoid, Adsorbed Other (See Comments)   Other reaction(s): local reaction        Medication List        Accurate as of January 26, 2024 12:26 PM. If you have any questions, ask your nurse or doctor.  acetaminophen  650 MG suppository Commonly known as: TYLENOL  Place 650-1,300 mg rectally every 4 (four) hours as needed for mild pain (pain score 1-3) or moderate pain (pain score 4-6).   allopurinol  300 MG tablet Commonly known as: ZYLOPRIM  Take 300 mg by mouth in the morning. What changed: Another medication with the same name was removed. Continue taking this medication, and follow the directions you see here. Changed by: Maude JONELLE Crease   ARTIFICIAL TEARS OP Place 1 drop into both eyes 3 (three) times daily as needed (dry eyes).   atorvastatin  40 MG tablet Commonly known as: LIPITOR Take 1 tablet (40 mg total) by mouth daily at 6 PM. What changed: Another medication with the same name was removed. Continue  taking this medication, and follow the directions you see here. Changed by: Maude JONELLE Crease   Biotin  1000 MCG tablet Take 1,000 mcg by mouth in the morning.   CALCIUM  600+D3 PO Take 1 tablet by mouth in the morning and at bedtime.   cholecalciferol  25 MCG (1000 UNIT) tablet Commonly known as: VITAMIN D3 Take 1,000 Units by mouth in the morning and at bedtime.   colchicine  0.6 MG tablet Take 0.6 mg by mouth daily. PRN   cycloSPORINE  0.05 % ophthalmic emulsion Commonly known as: Restasis  Place 1 drop into both eyes 2 (two) times daily. What changed: Another medication with the same name was removed. Continue taking this medication, and follow the directions you see here. Changed by: Maude JONELLE Crease   Eliquis  5 MG Tabs tablet Generic drug: apixaban  Take 1 tablet (5 mg total) by mouth 2 (two) times daily.   Entresto  24-26 MG Generic drug: sacubitril -valsartan  Take 1 tablet by mouth 2 (two) times daily.   ferrous sulfate  325 (65 FE) MG EC tablet Take 325 mg by mouth at bedtime.   FISH OIL  PO Take 1 capsule by mouth in the morning.   furosemide  80 MG tablet Commonly known as: Lasix  Take 1 tablet (80 mg total) by mouth daily. What changed: when to take this   Jardiance  10 MG Tabs tablet Generic drug: empagliflozin  Take 1 tablet (10 mg total) by mouth daily.   losartan  50 MG tablet Commonly known as: COZAAR  Take 1 tablet (50 mg total) by mouth daily.   metoprolol  succinate 25 MG 24 hr tablet Commonly known as: TOPROL -XL Take 1 tablet (25 mg total) by mouth daily.   multivitamin with minerals Tabs tablet Take 1 tablet by mouth in the morning. Centrum Silver (NO IRON)   nitroGLYCERIN  0.4 MG SL tablet Commonly known as: NITROSTAT  Place 1 tablet (0.4 mg total) under the tongue every 5 (five) minutes x 3 doses as needed for chest pain.   omeprazole  40 MG capsule Commonly known as: PRILOSEC Take 40 mg by mouth in the morning.   omeprazole  40 MG capsule Commonly  known as: PRILOSEC Take 1 capsule (40 mg total) by mouth daily.   oxyCODONE  5 MG immediate release tablet Commonly known as: Roxicodone  Take 1 tablet (5 mg total) by mouth every 6 (six) hours as needed for severe pain (pain score 7-10).   polycarbophil 625 MG tablet Commonly known as: FIBERCON Take 625 mg by mouth in the morning and at bedtime.   pramipexole  0.5 MG tablet Commonly known as: MIRAPEX  Take 1 tablet (0.5 mg total) by mouth daily.   spironolactone  25 MG tablet Commonly known as: ALDACTONE  Take 1 tablet (25 mg total) by mouth daily.   temazepam  15 MG capsule Commonly known as: RESTORIL   Take 1 capsule (15 mg total) by mouth at bedtime. What changed: Another medication with the same name was removed. Continue taking this medication, and follow the directions you see here. Changed by: Maude JONELLE Crease   Turmeric Curcumin 500 MG Caps Take 500 mg by mouth at bedtime.   vitamin E 180 MG (400 UNITS) capsule Take 400 Units by mouth at bedtime.        Allergies:  Allergies  Allergen Reactions   Morphine  And Codeine Other (See Comments)    LARGER DOSES OF MORPHINE  CAUSES BODY TWITCHING   Penicillins Anaphylaxis    Childhood reaction Has patient had a PCN reaction causing immediate rash, facial/tongue/throat swelling, SOB or lightheadedness with hypotension: Yes Has patient had a PCN reaction causing severe rash involving mucus membranes or skin necrosis: Unknown Has patient had a PCN reaction that required hospitalization: No Has patient had a PCN reaction occurring within the last 10 years: no (5 or 83 yrs old) If all of the above answers are NO, then may proceed with Cephalosporin use.    Baclofen Other (See Comments)    Other reaction(s): Unknown   Celecoxib Other (See Comments)    Other reaction(s): Unknown   Doxycycline  Other (See Comments)   Levofloxacin Other (See Comments)    Other reaction(s): abnl taste   Penicillin G Benzathine Other (See Comments)    Pneumococcal Vac Polyvalent Other (See Comments)    Other reaction(s): local reaction   Sulfa Antibiotics Other (See Comments)    Unknown childhood reaction   Sulfamethoxazole Other (See Comments)   Tetanus Toxoid, Adsorbed Other (See Comments)    Other reaction(s): local reaction    Past Medical History, Surgical history, Social history, and Family History were reviewed and updated.  Review of Systems: Review of Systems  Constitutional:  Positive for malaise/fatigue.  HENT: Negative.    Eyes: Negative.   Respiratory:  Positive for shortness of breath.   Cardiovascular:  Positive for palpitations and leg swelling.  Gastrointestinal: Negative.   Genitourinary: Negative.   Musculoskeletal: Negative.   Skin: Negative.   Neurological: Negative.   Endo/Heme/Allergies:  Bruises/bleeds easily.  Psychiatric/Behavioral: Negative.        Physical Exam:  height is 5' 3 (1.6 m) and weight is 172 lb (78 kg). Her oral temperature is 97.7 F (36.5 C). Her blood pressure is 105/58 (abnormal) and her pulse is 69. Her respiration is 19 and oxygen  saturation is 99%.   Wt Readings from Last 3 Encounters:  01/26/24 172 lb (78 kg)  12/07/23 172 lb 9.6 oz (78.3 kg)  08/06/23 174 lb 9.6 oz (79.2 kg)   Physical Exam Vitals reviewed.  HENT:     Head: Normocephalic and atraumatic.  Eyes:     Pupils: Pupils are equal, round, and reactive to light.  Cardiovascular:     Rate and Rhythm: Normal rate and regular rhythm.     Heart sounds: Normal heart sounds.  Pulmonary:     Effort: Pulmonary effort is normal.     Breath sounds: Normal breath sounds.  Abdominal:     General: Bowel sounds are normal.     Palpations: Abdomen is soft.  Musculoskeletal:        General: No tenderness or deformity. Normal range of motion.     Cervical back: Normal range of motion.  Lymphadenopathy:     Cervical: No cervical adenopathy.  Skin:    General: Skin is warm and dry.     Findings: No erythema or  rash.  Neurological:     Mental Status: She is alert and oriented to person, place, and time.  Psychiatric:        Behavior: Behavior normal.        Thought Content: Thought content normal.        Judgment: Judgment normal.      Lab Results  Component Value Date   WBC 5.8 07/28/2023   HGB 13.2 07/28/2023   HCT 40.1 07/28/2023   MCV 91.3 07/28/2023   PLT 180 07/28/2023   Lab Results  Component Value Date   FERRITIN 119 07/28/2023   IRON 98 07/28/2023   TIBC 276 07/28/2023   UIBC 178 07/28/2023   IRONPCTSAT 36 (H) 07/28/2023   Lab Results  Component Value Date   RETICCTPCT 1.3 01/26/2024   RBC 4.20 01/26/2024   RETICCTABS 39.7 05/11/2014   No results found for: KPAFRELGTCHN, LAMBDASER, KAPLAMBRATIO No results found for: IGGSERUM, IGA, IGMSERUM No results found for: STEPHANY CARLOTA BENSON MARKEL EARLA JOANNIE DOC VICK, SPEI   Chemistry      Component Value Date/Time   NA 137 07/28/2023 1309   NA 142 11/14/2022 0000   NA 144 04/13/2017 1134   NA 142 07/28/2016 1144   K 4.5 07/28/2023 1309   K 3.3 04/13/2017 1134   K 3.6 07/28/2016 1144   CL 98 07/28/2023 1309   CL 97 (L) 04/13/2017 1134   CO2 30 07/28/2023 1309   CO2 32 04/13/2017 1134   CO2 33 (H) 07/28/2016 1144   BUN 46 (H) 07/28/2023 1309   BUN 41 (H) 11/14/2022 0000   BUN 18 04/13/2017 1134   BUN 22.6 07/28/2016 1144   CREATININE 1.70 (H) 07/28/2023 1309   CREATININE 1.0 04/13/2017 1134   CREATININE 1.0 07/28/2016 1144      Component Value Date/Time   CALCIUM  10.0 07/28/2023 1309   CALCIUM  10.1 04/13/2017 1134   CALCIUM  9.5 07/28/2016 1144   ALKPHOS 117 07/28/2023 1309   ALKPHOS 103 (H) 04/13/2017 1134   ALKPHOS 84 07/28/2016 1144   AST 27 07/28/2023 1309   AST 24 07/28/2016 1144   ALT 20 07/28/2023 1309   ALT 27 04/13/2017 1134   ALT 17 07/28/2016 1144   BILITOT 0.5 07/28/2023 1309   BILITOT 0.41 07/28/2016 1144     On her peripheral blood smear,  show the normochromic and normocytic population of red blood cells.  There is no schistocytes.  I see no teardrop cells.  There is no nucleated red blood cells.  I see no rouleaux formation.  White blood cells appear normal in morphology and maturation.  There is no immature myeloid or lymphoid forms.  Platelets are decreased in number.  Most of the platelets are small.  Platelets are fairly well granulated.  Impression and Plan:  Ms. Will is a very pleasant 83 yo caucasian female with a chronic DVT in the right lower extremity.  She has changed over to Eliquis  5 mg PO BID for atrial fib.  The problem now clearly is this thrombocytopenia.  Again, I am not sure as to why she has the thrombocytopenia.  I would have to think that this might be stemming from medications.  I am not sure which medication could be doing this.  Again looking at the blood smear, it looks like there might be a production problem.  The platelets that I see are small.  I want to need to have her come back in a few days.  I need to  have her recheck her CBC.  If her platelet count is lower, we will have to get her off the Eliquis .  Ultimately, we may have to think about a bone marrow biopsy for her.  Also noted that her renal function is little bit worse.  I would not I would have to think this is from her medications.  It may also be from her congestive heart failure.  I suppose that her thrombocytopenia could be from the congestive heart failure and that she may have decreased blood flow to her marrow to help provide for platelet manufacture.  I certainly did not expect all of this.  This is a truly new problem that we will going to have to figure out.  Again she is on a ton of medications.  She has a lot of health issues.  We may have to get her family doctor and cardiologist involved. Iron studies pending.    Maude JONELLE Crease, MD 9/30/202512:26 PM

## 2024-01-27 DIAGNOSIS — I5030 Unspecified diastolic (congestive) heart failure: Secondary | ICD-10-CM | POA: Diagnosis not present

## 2024-01-27 DIAGNOSIS — I4891 Unspecified atrial fibrillation: Secondary | ICD-10-CM | POA: Diagnosis not present

## 2024-01-29 ENCOUNTER — Ambulatory Visit: Payer: Self-pay | Admitting: Hematology & Oncology

## 2024-01-29 ENCOUNTER — Inpatient Hospital Stay: Attending: Hematology & Oncology

## 2024-01-29 DIAGNOSIS — D500 Iron deficiency anemia secondary to blood loss (chronic): Secondary | ICD-10-CM | POA: Diagnosis present

## 2024-01-29 DIAGNOSIS — D696 Thrombocytopenia, unspecified: Secondary | ICD-10-CM

## 2024-01-29 DIAGNOSIS — Z86718 Personal history of other venous thrombosis and embolism: Secondary | ICD-10-CM | POA: Insufficient documentation

## 2024-01-29 DIAGNOSIS — Z7901 Long term (current) use of anticoagulants: Secondary | ICD-10-CM | POA: Diagnosis not present

## 2024-01-29 LAB — CBC WITH DIFFERENTIAL (CANCER CENTER ONLY)
Abs Immature Granulocytes: 0.04 K/uL (ref 0.00–0.07)
Basophils Absolute: 0 K/uL (ref 0.0–0.1)
Basophils Relative: 1 %
Eosinophils Absolute: 0.2 K/uL (ref 0.0–0.5)
Eosinophils Relative: 3 %
HCT: 38.8 % (ref 36.0–46.0)
Hemoglobin: 12.7 g/dL (ref 12.0–15.0)
Immature Granulocytes: 1 %
Lymphocytes Relative: 21 %
Lymphs Abs: 1.3 K/uL (ref 0.7–4.0)
MCH: 30.6 pg (ref 26.0–34.0)
MCHC: 32.7 g/dL (ref 30.0–36.0)
MCV: 93.5 fL (ref 80.0–100.0)
Monocytes Absolute: 0.5 K/uL (ref 0.1–1.0)
Monocytes Relative: 9 %
Neutro Abs: 4.1 K/uL (ref 1.7–7.7)
Neutrophils Relative %: 65 %
Platelet Count: 148 K/uL — ABNORMAL LOW (ref 150–400)
RBC: 4.15 MIL/uL (ref 3.87–5.11)
RDW: 14.1 % (ref 11.5–15.5)
WBC Count: 6.2 K/uL (ref 4.0–10.5)
nRBC: 0.3 % — ABNORMAL HIGH (ref 0.0–0.2)

## 2024-01-29 LAB — RETICULOCYTES
Immature Retic Fract: 10.7 % (ref 2.3–15.9)
RBC.: 4.14 MIL/uL (ref 3.87–5.11)
Retic Count, Absolute: 60.4 K/uL (ref 19.0–186.0)
Retic Ct Pct: 1.5 % (ref 0.4–3.1)

## 2024-01-29 LAB — CMP (CANCER CENTER ONLY)
ALT: 21 U/L (ref 0–44)
AST: 32 U/L (ref 15–41)
Albumin: 4.5 g/dL (ref 3.5–5.0)
Alkaline Phosphatase: 110 U/L (ref 38–126)
Anion gap: 13 (ref 5–15)
BUN: 67 mg/dL — ABNORMAL HIGH (ref 8–23)
CO2: 27 mmol/L (ref 22–32)
Calcium: 10.3 mg/dL (ref 8.9–10.3)
Chloride: 100 mmol/L (ref 98–111)
Creatinine: 2.14 mg/dL — ABNORMAL HIGH (ref 0.44–1.00)
GFR, Estimated: 22 mL/min — ABNORMAL LOW (ref >60)
Glucose, Bld: 107 mg/dL — ABNORMAL HIGH (ref 70–99)
Potassium: 4.3 mmol/L (ref 3.5–5.1)
Sodium: 140 mmol/L (ref 135–145)
Total Bilirubin: 0.5 mg/dL (ref 0.0–1.2)
Total Protein: 7.7 g/dL (ref 6.5–8.1)

## 2024-01-29 LAB — SAVE SMEAR(SSMR), FOR PROVIDER SLIDE REVIEW

## 2024-01-29 LAB — LACTATE DEHYDROGENASE: LDH: 226 U/L — ABNORMAL HIGH (ref 98–192)

## 2024-02-01 ENCOUNTER — Other Ambulatory Visit (HOSPITAL_COMMUNITY): Payer: Self-pay

## 2024-02-01 ENCOUNTER — Other Ambulatory Visit: Payer: Self-pay

## 2024-02-02 ENCOUNTER — Other Ambulatory Visit (HOSPITAL_COMMUNITY): Payer: Self-pay

## 2024-02-09 ENCOUNTER — Telehealth: Payer: Self-pay | Admitting: Cardiovascular Disease

## 2024-02-09 DIAGNOSIS — I428 Other cardiomyopathies: Secondary | ICD-10-CM | POA: Diagnosis not present

## 2024-02-09 DIAGNOSIS — I5043 Acute on chronic combined systolic (congestive) and diastolic (congestive) heart failure: Secondary | ICD-10-CM | POA: Diagnosis not present

## 2024-02-09 DIAGNOSIS — N183 Chronic kidney disease, stage 3 unspecified: Secondary | ICD-10-CM | POA: Diagnosis not present

## 2024-02-09 DIAGNOSIS — Z79899 Other long term (current) drug therapy: Secondary | ICD-10-CM

## 2024-02-09 DIAGNOSIS — I10 Essential (primary) hypertension: Secondary | ICD-10-CM

## 2024-02-09 DIAGNOSIS — I5022 Chronic systolic (congestive) heart failure: Secondary | ICD-10-CM

## 2024-02-09 DIAGNOSIS — J40 Bronchitis, not specified as acute or chronic: Secondary | ICD-10-CM | POA: Diagnosis not present

## 2024-02-09 MED ORDER — SPIRONOLACTONE 25 MG PO TABS: 12.5000 mg | ORAL_TABLET | Freq: Every day | ORAL | Status: AC

## 2024-02-09 MED ORDER — FUROSEMIDE 80 MG PO TABS: 40.0000 mg | ORAL_TABLET | Freq: Every day | ORAL | Status: AC

## 2024-02-09 NOTE — Telephone Encounter (Signed)
 Spoke with patient and shared response from Dr. Court:  Her serum creatinine is elevated compared to 6 months ago. She can cut her furosemide  from 80 mg to 40 mg and her spironolactone  from 25 mg to 12.5 mg and recheck a basic metabolic panel in 2 to 3 weeks.    Patient will cut her furosemide  and spironolactone  tablets in half. Does not need refills at this time. Medication list updated with dose changes.  BMET ordered. Patient aware to go to [facility] (or our office) to have this drawn in 2-3 weeks.  Patient verbalized understanding of the above and expressed appreciation for call.

## 2024-02-09 NOTE — Telephone Encounter (Signed)
 Forwarding question to provider  Pt c/o medication issue:   1. Name of Medication:   furosemide  (LASIX ) 80 MG tablet  spironolactone  (ALDACTONE ) 25 MG tablet    2. How are you currently taking this medication (dosage and times per day)? As written    3. Are you having a reaction (difficulty breathing--STAT)? No    4. What is your medication issue?  Pt called in stating her PCP is concerned about her taking these two medications because her kidney are at stage 3. She states her PCP told her to call and ask Dr. Court for advice.

## 2024-02-09 NOTE — Telephone Encounter (Signed)
 Pt c/o medication issue:  1. Name of Medication:   furosemide  (LASIX ) 80 MG tablet  spironolactone  (ALDACTONE ) 25 MG tablet   2. How are you currently taking this medication (dosage and times per day)? As written   3. Are you having a reaction (difficulty breathing--STAT)? No   4. What is your medication issue?  Pt called in stating her PCP is concerned about her taking these two medications because her kidney are at stage 3. She states her PCP told her to call and ask Dr. Court for advice.

## 2024-02-12 ENCOUNTER — Other Ambulatory Visit (HOSPITAL_COMMUNITY): Payer: Self-pay

## 2024-02-13 DIAGNOSIS — G4733 Obstructive sleep apnea (adult) (pediatric): Secondary | ICD-10-CM | POA: Diagnosis not present

## 2024-02-15 ENCOUNTER — Other Ambulatory Visit (HOSPITAL_COMMUNITY): Payer: Self-pay

## 2024-02-15 ENCOUNTER — Other Ambulatory Visit: Payer: Self-pay

## 2024-02-16 ENCOUNTER — Other Ambulatory Visit: Payer: Self-pay

## 2024-02-16 ENCOUNTER — Other Ambulatory Visit (HOSPITAL_COMMUNITY): Payer: Self-pay

## 2024-02-16 MED ORDER — ALLOPURINOL 300 MG PO TABS
300.0000 mg | ORAL_TABLET | Freq: Every day | ORAL | 0 refills | Status: DC
  Filled 2024-03-07: qty 90, 90d supply, fill #0

## 2024-02-16 MED ORDER — ATORVASTATIN CALCIUM 40 MG PO TABS
40.0000 mg | ORAL_TABLET | Freq: Every evening | ORAL | 0 refills | Status: DC
  Filled 2024-02-16: qty 90, 90d supply, fill #0

## 2024-02-24 ENCOUNTER — Other Ambulatory Visit (HOSPITAL_COMMUNITY): Payer: Self-pay

## 2024-02-25 DIAGNOSIS — I5022 Chronic systolic (congestive) heart failure: Secondary | ICD-10-CM | POA: Diagnosis not present

## 2024-02-25 DIAGNOSIS — I10 Essential (primary) hypertension: Secondary | ICD-10-CM | POA: Diagnosis not present

## 2024-02-25 DIAGNOSIS — Z79899 Other long term (current) drug therapy: Secondary | ICD-10-CM | POA: Diagnosis not present

## 2024-02-25 DIAGNOSIS — N183 Chronic kidney disease, stage 3 unspecified: Secondary | ICD-10-CM | POA: Diagnosis not present

## 2024-02-26 ENCOUNTER — Ambulatory Visit: Payer: Self-pay | Admitting: Cardiovascular Disease

## 2024-02-26 DIAGNOSIS — E7800 Pure hypercholesterolemia, unspecified: Secondary | ICD-10-CM | POA: Diagnosis not present

## 2024-02-26 DIAGNOSIS — I428 Other cardiomyopathies: Secondary | ICD-10-CM | POA: Diagnosis not present

## 2024-02-26 DIAGNOSIS — N183 Chronic kidney disease, stage 3 unspecified: Secondary | ICD-10-CM | POA: Diagnosis not present

## 2024-02-26 DIAGNOSIS — I5043 Acute on chronic combined systolic (congestive) and diastolic (congestive) heart failure: Secondary | ICD-10-CM | POA: Diagnosis not present

## 2024-02-26 DIAGNOSIS — I10 Essential (primary) hypertension: Secondary | ICD-10-CM | POA: Diagnosis not present

## 2024-02-26 DIAGNOSIS — J40 Bronchitis, not specified as acute or chronic: Secondary | ICD-10-CM | POA: Diagnosis not present

## 2024-02-26 LAB — BASIC METABOLIC PANEL WITH GFR
BUN/Creatinine Ratio: 31 — ABNORMAL HIGH (ref 12–28)
BUN: 51 mg/dL — ABNORMAL HIGH (ref 8–27)
CO2: 24 mmol/L (ref 20–29)
Calcium: 9.9 mg/dL (ref 8.7–10.3)
Chloride: 99 mmol/L (ref 96–106)
Creatinine, Ser: 1.64 mg/dL — ABNORMAL HIGH (ref 0.57–1.00)
Glucose: 99 mg/dL (ref 70–99)
Potassium: 4.3 mmol/L (ref 3.5–5.2)
Sodium: 138 mmol/L (ref 134–144)
eGFR: 31 mL/min/1.73 — ABNORMAL LOW (ref >59)

## 2024-02-27 DIAGNOSIS — I4891 Unspecified atrial fibrillation: Secondary | ICD-10-CM | POA: Diagnosis not present

## 2024-03-04 ENCOUNTER — Encounter: Payer: Self-pay | Admitting: *Deleted

## 2024-03-07 ENCOUNTER — Other Ambulatory Visit (HOSPITAL_COMMUNITY): Payer: Self-pay

## 2024-03-07 ENCOUNTER — Other Ambulatory Visit: Payer: Self-pay

## 2024-03-08 ENCOUNTER — Ambulatory Visit: Attending: Emergency Medicine | Admitting: Emergency Medicine

## 2024-03-08 ENCOUNTER — Other Ambulatory Visit (HOSPITAL_COMMUNITY): Payer: Self-pay

## 2024-03-08 ENCOUNTER — Encounter: Payer: Self-pay | Admitting: Emergency Medicine

## 2024-03-08 VITALS — BP 104/58 | HR 70 | Ht 63.0 in | Wt 169.0 lb

## 2024-03-08 DIAGNOSIS — I480 Paroxysmal atrial fibrillation: Secondary | ICD-10-CM | POA: Diagnosis not present

## 2024-03-08 DIAGNOSIS — N1832 Chronic kidney disease, stage 3b: Secondary | ICD-10-CM | POA: Diagnosis not present

## 2024-03-08 DIAGNOSIS — I5020 Unspecified systolic (congestive) heart failure: Secondary | ICD-10-CM | POA: Diagnosis not present

## 2024-03-08 DIAGNOSIS — I10 Essential (primary) hypertension: Secondary | ICD-10-CM | POA: Diagnosis not present

## 2024-03-08 DIAGNOSIS — Z79899 Other long term (current) drug therapy: Secondary | ICD-10-CM

## 2024-03-08 DIAGNOSIS — I447 Left bundle-branch block, unspecified: Secondary | ICD-10-CM

## 2024-03-08 DIAGNOSIS — I2510 Atherosclerotic heart disease of native coronary artery without angina pectoris: Secondary | ICD-10-CM

## 2024-03-08 DIAGNOSIS — E782 Mixed hyperlipidemia: Secondary | ICD-10-CM

## 2024-03-08 DIAGNOSIS — G4733 Obstructive sleep apnea (adult) (pediatric): Secondary | ICD-10-CM | POA: Diagnosis not present

## 2024-03-08 DIAGNOSIS — R600 Localized edema: Secondary | ICD-10-CM | POA: Diagnosis not present

## 2024-03-08 DIAGNOSIS — J309 Allergic rhinitis, unspecified: Secondary | ICD-10-CM | POA: Diagnosis not present

## 2024-03-08 MED ORDER — FLUTICASONE PROPIONATE 50 MCG/ACT NA SUSP
1.0000 | Freq: Two times a day (BID) | NASAL | 5 refills | Status: AC
  Filled 2024-03-08 – 2024-03-19 (×2): qty 16, 30d supply, fill #0
  Filled 2024-04-13: qty 16, 30d supply, fill #1
  Filled 2024-05-16: qty 16, 30d supply, fill #2

## 2024-03-08 NOTE — Patient Instructions (Addendum)
 Medication Instructions:  NO CHANGES  Lab Work: FASTING LIPID PANEL AND BMET TO BE DONE TODAY.  Testing/Procedures: TO BE DONE IN EXACTLY 3 MONTHS (AROUND 06-08-24)  Your physician has requested that you have an echocardiogram. Echocardiography is a painless test that uses sound waves to create images of your heart. It provides your doctor with information about the size and shape of your heart and how well your heart's chambers and valves are working. This procedure takes approximately one hour. There are no restrictions for this procedure. Please do NOT wear cologne, perfume, aftershave, or lotions (deodorant is allowed). Please arrive 15 minutes prior to your appointment time.  Please note: We ask at that you not bring children with you during ultrasound (echo/ vascular) testing. Due to room size and safety concerns, children are not allowed in the ultrasound rooms during exams. Our front office staff cannot provide observation of children in our lobby area while testing is being conducted. An adult accompanying a patient to their appointment will only be allowed in the ultrasound room at the discretion of the ultrasound technician under special circumstances. We apologize for any inconvenience.   Follow-Up: At [facility], you and your health needs are our priority.  As part of our continuing mission to provide you with exceptional heart care, our providers are all part of one team.  This team includes your primary Cardiologist (physician) and Advanced Practice Providers or APPs (Physician Assistants and Nurse Practitioners) who all work together to provide you with the care you need, when you need it.  Your next appointment:   6 MONTHS  Provider:   Dorn Lesches, MD

## 2024-03-08 NOTE — Progress Notes (Signed)
 Cardiology Office Note:    Date:  03/08/2024  ID:  [name], DOB 03/24/1941, MRN [id number] PCP: Arloa Elsie SAUNDERS, MD  [facility] Providers Cardiologist:  Dorn Lesches, MD Cardiology APP:  Rana Lum CROME, NP       Patient Profile:       Chief Complaint: 85-month follow-up History of Present Illness:  [name] is a 83 y.o. female with visit-pertinent history of hypertension, left bundle branch block, bilateral lower extremity edema, congestive heart failure, hyperlipidemia, coronary artery disease, atrial fibrillation, GERD, history of right lower extremity edema complicated by pulmonary embolism on Xarelto , thrombophilia with prothrombin gene mutation  She was admitted to the hospital on 07/09/2019 for 4 days and congestive heart failure.  She underwent right and left heart catheterization by Dr. Burnard revealing 40% proximal LAD lesion with elevated right atrial pressure, pulmonary artery pressure, wedge pressure and LVEDP.  She was diuresed.  She was started on furosemide  80 mg daily.  She does have history of thrombophilia with prothrombin gene mutation.  She has had DVT in the past as well as pulmonary emboli.  She is on Xarelto .  She was hospitalized on 06/2023 with chest pain and heart failure.  She was found to be in atrial fibrillation with RVR.  She converted to normal sinus rhythm.  Cardiac catheterization at that time revealed a 50% proximal LAD lesion unchanged from prior cath in 2021.  Echocardiogram revealed LVEF of 20 to 25%.  She was placed on GDMT.  She does have a chronic left bundle branch block and was seen by Dr. Cindie during her hospitalization who raised the possibility of CRT device.  Cardiac MRI was performed in anticipation of this showing LVEF 35% with notable septal dyssynchrony, moderate dilation and left ventricular size, parametric mapping pattern could be seen in isolated mid distal LAD ischemia, normal right ventricular systolic  function.  She was last seen in clinic on 12/07/2023.  She was doing well and in sinus rhythm.  No medication changes were made she was to follow-up in 3 months.  On 02/09/2024 patient's creatinine was eleavted around 2.2-2.1 and lasix  was decreased to 40 mg daily and spironolactone  to 12.5 daily.  Repeat labs on 10/30 showed improved creatinine 1.64.   Discussed the use of AI scribe software for clinical note transcription with the patient, who gave verbal consent to proceed.  History of Present Illness [name] is an 83 year old female with atrial fibrillation and heart failure who presents for a routine follow-up.  Today she is doing well without acute cardiovascular concerns or complaints.  She denies any chest pains or any significant exertional symptoms.  She monitors her blood pressure at home, with a recent reading of 132/61 mmHg two hours post-medication. She performs household chores with assistance from her husband.  She has not experienced any abnormal heartbeats recently.  She denies any symptoms concerning for recurrent atrial fibrillation.  She uses a pulse oximeter at home, generally maintaining oxygen  saturation in the 90s. She was previously sent home with oxygen  but has not used it in two months.  She monitors her weight, which remains stable around 170 pounds. She experiences occasional dyspnea when rushing but denies weight gain, orthopnea, PND. She is cautious about sodium intake, aiming to stay below 1800 mg daily, though she is uncertain about sodium content when eating out.  No syncope, presyncope, lightheadedness, dizziness   Review of systems:  Please see the history of present illness.  All other systems are reviewed and otherwise negative.      Studies Reviewed:    EKG Interpretation Date/Time:  Tuesday March 08 2024 11:12:35 EST Ventricular Rate:  70 PR Interval:  172 QRS Duration:  156 QT Interval:  436 QTC Calculation: 470 R  Axis:   -50  Text Interpretation: Sinus rhythm with Premature atrial complexes Left axis deviation Left bundle branch block When compared with ECG of 27-Jul-2023 11:23, Premature atrial complexes are now Present QRS axis Shifted left Confirmed by Rana Dixon [phone] on 03/08/2024 11:50:39 AM    Cardiac MRI 09/04/2023 1. Moderate decrease in left ventricular systolic function (LVEF =35%). Notable septal dyssynchrony. Moderate dilation in left ventricular size   2. Parametric mapping pattern could be seen in isolated mid-distal LAD ischemia. Suboptimal resting perfusion acquisition.   3. Normal right ventricular systolic function (RVEF =52%).  Cardiac catheterization 07/16/2023  Prox LAD lesion is 50% stenosed.   1. Moderate proximal LAD stenosis which was described on previous cardiac catheterization in 2021.  No evidence of obstructive disease 2.  Left ventricular angiography was not performed.  EF was severely reduced by echo. 3.  Right heart catheterization showed mildly elevated wedge pressure, moderate pulmonary hypertension and normal cardiac output.   Recommendations: Medical therapy for moderate nonobstructive coronary artery disease and systolic heart failure which seems to be due to nonischemic cardiomyopathy. Diagnostic Dominance: Right  Echocardiogram 07/15/2023 1. Left ventricular ejection fraction, by estimation, is 20 to 25%. The  left ventricle has severely decreased function. The left ventricle  demonstrates global hypokinesis. The left ventricular internal cavity size  was moderately dilated. Left  ventricular diastolic parameters are consistent with Grade I diastolic  dysfunction (impaired relaxation).   2. Right ventricular systolic function is normal. The right ventricular  size is normal. There is normal pulmonary artery systolic pressure.   3. Left atrial size was severely dilated.   4. The mitral valve is normal in structure. Mild to moderate mitral valve   regurgitation. No evidence of mitral stenosis.   5. The aortic valve is tricuspid. Aortic valve regurgitation is not  visualized. No aortic stenosis is present.   6. The inferior vena cava is normal in size with greater than 50%  respiratory variability, suggesting right atrial pressure of 3 mmHg.   Echocardiogram 10/10/2022 1. Left ventricular ejection fraction, by estimation, is 40 to 45%. The  left ventricle has mildly decreased function. The left ventricle has no  regional wall motion abnormalities. Left ventricular diastolic parameters  are consistent with Grade I  diastolic dysfunction (impaired relaxation). Significant dyssynchrony due  to underlying LBBB.   2. Right ventricular systolic function is normal. The right ventricular  size is normal.   3. Left atrial size was severely dilated.   4. The mitral valve is normal in structure. Trivial mitral valve  regurgitation. No evidence of mitral stenosis.   5. The aortic valve is normal in structure. Aortic valve regurgitation is  not visualized. No aortic stenosis is present.   6. The inferior vena cava is normal in size with greater than 50%  respiratory variability, suggesting right atrial pressure of 3 mmHg.   Risk Assessment/Calculations:    CHA2DS2-VASc Score = 6   This indicates a 9.7% annual risk of stroke. The patient's score is based upon: CHF History: 1 HTN History: 1 Diabetes History: 0 Stroke History: 0 Vascular Disease History: 1 Age Score: 2 Gender Score: 1  Physical Exam:   VS:  BP (!) 104/58 (BP Location: Left Arm, Patient Position: Sitting, Cuff Size: Normal)   Pulse 70   Ht 5' 3 (1.6 m)   Wt 169 lb (76.7 kg)   BMI 29.94 kg/m    Wt Readings from Last 3 Encounters:  03/08/24 169 lb (76.7 kg)  01/26/24 172 lb (78 kg)  12/07/23 172 lb 9.6 oz (78.3 kg)    GEN: Well nourished, well developed in no acute distress NECK: No JVD; No carotid bruits CARDIAC: RRR, no murmurs, rubs,  gallops RESPIRATORY:  Clear to auscultation without rales, wheezing or rhonchi  ABDOMEN: Soft, non-tender, non-distended EXTREMITIES:  No edema; No acute deformity      Assessment and Plan:  Chronic systolic heart failure Echo 09/2022 LVEF 40 to 45% Echo 06/2023 LVEF 20 to 25% R/LHC 06/2023 with nonobstructive disease cMRI 08/2023 with LVEF 35% with notable septal dyssynchrony - LBBB likely cause of her reduced EF based on cardiac MRI - Today she is euvolemic and well compensated on exam.  NYHA class II.  Volume status is stable and she is without any limitations - Will continue current medical therapy with close monitoring of kidney function - EF at 35% on MRI will hold off on referral for CRT - Continue Jardiance  10 mg daily - Continue furosemide  40 mg daily - Continue metoprolol  succinate 25 mg daily - Continue Entresto  24-26 mg twice daily - Continue spironolactone  12.5 mg daily - Repeat echocardiogram x 3 months for routine monitoring of LV function - BMET today  Coronary artery disease LHC 06/2023 with stable nonobstructive disease - Today she is stable without chest pains.  Remains fairly active without exertional symptoms.  No indication of further ischemic evaluation at this time - Continue atorvastatin  40 mg daily   Paroxysmal atrial fibrillation Diagnosed 06/2023 in A-fib with RVR, spontaneously converted to NSR - Today she is maintaining sinus rhythm - She denies any symptoms concerning for recurrent atrial fibrillation - She does meet dose reduction qualifications with age of 25 and creatinine greater than 1.5 - Will repeat BMET today and if creatinine remains greater than 1.5 will reduce Eliquis  to 2.5 mg twice daily otherwise continue Eliquis  as prescribed - Continue metoprolol  succinate 25 mg daily  Hypertension Blood pressure today is 104/58 - She is borderline low today otherwise she remains asymptomatic - Does home BP monitoring PCP with no reported hypotensive  readings - Continue HF GDMT  LBBB - Chronic  Hyperlipidemia LDL 42 on 06/2023 and well-controlled - Continue atorvastatin  40 mg daily - Repeat fasting lipid panel today  CKD stage IIIb Creatinine 1.64 and GFR 31 on 10/30 - Stable and improving  - BMET today     Dispo:  Return in about 6 months (around 09/05/2024).  Signed, Lum LITTIE Louis, NP

## 2024-03-09 ENCOUNTER — Ambulatory Visit: Payer: Self-pay | Admitting: Emergency Medicine

## 2024-03-09 DIAGNOSIS — I4891 Unspecified atrial fibrillation: Secondary | ICD-10-CM

## 2024-03-09 LAB — BASIC METABOLIC PANEL WITH GFR
BUN/Creatinine Ratio: 26 (ref 12–28)
BUN: 44 mg/dL — ABNORMAL HIGH (ref 8–27)
CO2: 26 mmol/L (ref 20–29)
Calcium: 10.1 mg/dL (ref 8.7–10.3)
Chloride: 101 mmol/L (ref 96–106)
Creatinine, Ser: 1.7 mg/dL — ABNORMAL HIGH (ref 0.57–1.00)
Glucose: 93 mg/dL (ref 70–99)
Potassium: 4.2 mmol/L (ref 3.5–5.2)
Sodium: 143 mmol/L (ref 134–144)
eGFR: 30 mL/min/1.73 — ABNORMAL LOW (ref >59)

## 2024-03-09 LAB — LIPID PANEL
Chol/HDL Ratio: 1.8 ratio (ref 0.0–4.4)
Cholesterol, Total: 126 mg/dL (ref 100–199)
HDL: 69 mg/dL (ref >39)
LDL Chol Calc (NIH): 41 mg/dL (ref 0–99)
Triglycerides: 80 mg/dL (ref 0–149)
VLDL Cholesterol Cal: 16 mg/dL (ref 5–40)

## 2024-03-10 DIAGNOSIS — N183 Chronic kidney disease, stage 3 unspecified: Secondary | ICD-10-CM | POA: Diagnosis not present

## 2024-03-10 DIAGNOSIS — J40 Bronchitis, not specified as acute or chronic: Secondary | ICD-10-CM | POA: Diagnosis not present

## 2024-03-10 DIAGNOSIS — I5043 Acute on chronic combined systolic (congestive) and diastolic (congestive) heart failure: Secondary | ICD-10-CM | POA: Diagnosis not present

## 2024-03-10 DIAGNOSIS — I428 Other cardiomyopathies: Secondary | ICD-10-CM | POA: Diagnosis not present

## 2024-03-16 ENCOUNTER — Other Ambulatory Visit: Payer: Self-pay

## 2024-03-16 ENCOUNTER — Other Ambulatory Visit (HOSPITAL_COMMUNITY): Payer: Self-pay

## 2024-03-16 MED ORDER — APIXABAN 2.5 MG PO TABS
2.5000 mg | ORAL_TABLET | Freq: Two times a day (BID) | ORAL | 3 refills | Status: AC
  Filled 2024-03-16: qty 90, 45d supply, fill #0
  Filled 2024-04-24: qty 90, 45d supply, fill #1
  Filled 2024-05-25: qty 90, 45d supply, fill #2

## 2024-03-18 ENCOUNTER — Other Ambulatory Visit (HOSPITAL_COMMUNITY): Payer: Self-pay

## 2024-03-19 ENCOUNTER — Other Ambulatory Visit (HOSPITAL_COMMUNITY): Payer: Self-pay

## 2024-03-21 ENCOUNTER — Other Ambulatory Visit: Payer: Self-pay

## 2024-03-21 ENCOUNTER — Other Ambulatory Visit (HOSPITAL_COMMUNITY): Payer: Self-pay

## 2024-03-25 ENCOUNTER — Other Ambulatory Visit (HOSPITAL_COMMUNITY): Payer: Self-pay

## 2024-03-27 DIAGNOSIS — J40 Bronchitis, not specified as acute or chronic: Secondary | ICD-10-CM | POA: Diagnosis not present

## 2024-03-27 DIAGNOSIS — N183 Chronic kidney disease, stage 3 unspecified: Secondary | ICD-10-CM | POA: Diagnosis not present

## 2024-03-27 DIAGNOSIS — I10 Essential (primary) hypertension: Secondary | ICD-10-CM | POA: Diagnosis not present

## 2024-03-27 DIAGNOSIS — E7800 Pure hypercholesterolemia, unspecified: Secondary | ICD-10-CM | POA: Diagnosis not present

## 2024-03-27 DIAGNOSIS — I428 Other cardiomyopathies: Secondary | ICD-10-CM | POA: Diagnosis not present

## 2024-03-27 DIAGNOSIS — I5043 Acute on chronic combined systolic (congestive) and diastolic (congestive) heart failure: Secondary | ICD-10-CM | POA: Diagnosis not present

## 2024-03-28 DIAGNOSIS — I4891 Unspecified atrial fibrillation: Secondary | ICD-10-CM | POA: Diagnosis not present

## 2024-03-29 ENCOUNTER — Other Ambulatory Visit (HOSPITAL_COMMUNITY): Payer: Self-pay

## 2024-03-29 MED ORDER — TEMAZEPAM 15 MG PO CAPS
15.0000 mg | ORAL_CAPSULE | Freq: Every day | ORAL | 0 refills | Status: AC
  Filled 2024-03-29: qty 90, 90d supply, fill #0

## 2024-04-12 ENCOUNTER — Other Ambulatory Visit (HOSPITAL_COMMUNITY): Payer: Self-pay

## 2024-04-13 ENCOUNTER — Other Ambulatory Visit: Payer: Self-pay

## 2024-04-13 ENCOUNTER — Other Ambulatory Visit (HOSPITAL_COMMUNITY): Payer: Self-pay

## 2024-04-13 MED ORDER — OMEPRAZOLE 40 MG PO CPDR
40.0000 mg | DELAYED_RELEASE_CAPSULE | Freq: Every day | ORAL | 0 refills | Status: AC
  Filled 2024-04-13: qty 90, 90d supply, fill #0

## 2024-04-27 ENCOUNTER — Ambulatory Visit: Admitting: Physician Assistant

## 2024-04-27 ENCOUNTER — Other Ambulatory Visit: Payer: Self-pay

## 2024-04-27 ENCOUNTER — Other Ambulatory Visit (HOSPITAL_COMMUNITY): Payer: Self-pay

## 2024-04-27 ENCOUNTER — Encounter: Payer: Self-pay | Admitting: Hematology & Oncology

## 2024-04-27 MED ORDER — PROMETHAZINE-DM 6.25-15 MG/5ML PO SYRP
5.0000 mL | ORAL_SOLUTION | Freq: Four times a day (QID) | ORAL | 0 refills | Status: AC | PRN
  Filled 2024-04-27: qty 120, 6d supply, fill #0

## 2024-04-29 ENCOUNTER — Other Ambulatory Visit (HOSPITAL_COMMUNITY): Payer: Self-pay

## 2024-05-04 ENCOUNTER — Other Ambulatory Visit (HOSPITAL_COMMUNITY): Payer: Self-pay

## 2024-05-04 ENCOUNTER — Other Ambulatory Visit: Payer: Self-pay

## 2024-05-04 ENCOUNTER — Other Ambulatory Visit: Payer: Self-pay | Admitting: Cardiovascular Disease

## 2024-05-04 MED ORDER — FUROSEMIDE 80 MG PO TABS
80.0000 mg | ORAL_TABLET | Freq: Every day | ORAL | 2 refills | Status: AC
  Filled 2024-05-04: qty 90, 90d supply, fill #0

## 2024-05-06 ENCOUNTER — Telehealth: Payer: Self-pay | Admitting: Cardiovascular Disease

## 2024-05-06 ENCOUNTER — Other Ambulatory Visit (HOSPITAL_COMMUNITY): Payer: Self-pay

## 2024-05-06 NOTE — Telephone Encounter (Signed)
 Called and spoke to pt. Correct doses given for lasix  and spironolactone , per Rana, NP. Pt verbalized understanding. She will be cutting the pills in half.

## 2024-05-06 NOTE — Telephone Encounter (Signed)
 Pt calling wanting confirmation of dosage for Spironolactone  and Furosemide . Pt stating that her Furosemide  and Spironolactone  have had to be cut in half when receiving from Pharmacy. Spironolactone  only comes as 25 mg, but will confirm what Furosemide  dosage is, as there are two orders in chart (40 mg and 80 mg). Pt has been getting 80 mg from Pharmacy and having to cut in half. Pt advised to continue taking 40 Lasix  and 12.5 Spironolactone  until confirmed otherwise, per last office visit. Will send to Lum Louis, NP and Dr Court for confirmation. Will also reach out to Pharmacy to see if shortage on their end for prescribed dosages.

## 2024-05-06 NOTE — Telephone Encounter (Signed)
 Pt wanting to know if she is suppose to be taking 80mg  or chopping them up again since they sent in 80mg  instead of 40. Please advise.

## 2024-05-16 ENCOUNTER — Other Ambulatory Visit (HOSPITAL_COMMUNITY): Payer: Self-pay

## 2024-05-18 ENCOUNTER — Other Ambulatory Visit: Payer: Self-pay

## 2024-05-18 ENCOUNTER — Other Ambulatory Visit (HOSPITAL_BASED_OUTPATIENT_CLINIC_OR_DEPARTMENT_OTHER): Payer: Self-pay

## 2024-05-18 ENCOUNTER — Other Ambulatory Visit (HOSPITAL_COMMUNITY): Payer: Self-pay

## 2024-05-18 MED ORDER — ATORVASTATIN CALCIUM 40 MG PO TABS
40.0000 mg | ORAL_TABLET | Freq: Every evening | ORAL | 0 refills | Status: AC
  Filled 2024-05-18: qty 90, 90d supply, fill #0

## 2024-05-18 MED ORDER — PRAMIPEXOLE DIHYDROCHLORIDE 0.5 MG PO TABS
0.5000 mg | ORAL_TABLET | Freq: Every day | ORAL | 0 refills | Status: AC
  Filled 2024-05-18: qty 90, 90d supply, fill #0

## 2024-05-25 ENCOUNTER — Other Ambulatory Visit (HOSPITAL_COMMUNITY): Payer: Self-pay

## 2024-05-26 ENCOUNTER — Telehealth: Payer: Self-pay | Admitting: Cardiovascular Disease

## 2024-05-26 NOTE — Telephone Encounter (Signed)
 Pt c/o medication issue:  1. Name of Medication:  apixaban  (ELIQUIS ) 2.5 MG TABS tablet  spironolactone  (ALDACTONE ) 25 MG tablet  sacubitril -valsartan  (ENTRESTO ) 24-26 MG   2. How are you currently taking this medication (dosage and times per day)?   3. Are you having a reaction (difficulty breathing--STAT)?   4. What is your medication issue?   Patient stated she is following-up on getting her [employer] grant for these medications.  Patient stated she will need the ICD/HD Diagnoses code for the application to be completed by February 16.

## 2024-05-27 ENCOUNTER — Encounter: Payer: Self-pay | Admitting: Family Medicine

## 2024-05-30 ENCOUNTER — Telehealth: Payer: Self-pay | Admitting: Pharmacy Technician

## 2024-05-30 ENCOUNTER — Other Ambulatory Visit (HOSPITAL_COMMUNITY): Payer: Self-pay

## 2024-05-30 ENCOUNTER — Encounter: Payer: Self-pay | Admitting: Hematology & Oncology

## 2024-05-30 NOTE — Telephone Encounter (Signed)
" ° °  Patient Advocate Encounter   The patient was approved for a [employer] grant that will help cover the cost of ELIQUIS /ENTRESTO  Total amount awarded, [id number].  Effective: 06/16/24 - 06/15/25   APW:[id number] ERW:[id number] Hmnle:[id number] PI:[id number] [employer] ID: [id number]   Pharmacy provided with approval and processing information. Patient informed via mychart  "

## 2024-05-30 NOTE — Telephone Encounter (Signed)
 Lorrene renewed and in [facility] system

## 2024-05-31 ENCOUNTER — Other Ambulatory Visit (HOSPITAL_COMMUNITY): Payer: Self-pay

## 2024-05-31 ENCOUNTER — Other Ambulatory Visit: Payer: Self-pay | Admitting: Family Medicine

## 2024-05-31 DIAGNOSIS — D3502 Benign neoplasm of left adrenal gland: Secondary | ICD-10-CM

## 2024-06-01 ENCOUNTER — Other Ambulatory Visit: Payer: Self-pay

## 2024-06-01 ENCOUNTER — Other Ambulatory Visit (HOSPITAL_COMMUNITY): Payer: Self-pay

## 2024-06-01 MED ORDER — ALLOPURINOL 300 MG PO TABS
300.0000 mg | ORAL_TABLET | Freq: Every day | ORAL | 0 refills | Status: AC
  Filled 2024-06-01: qty 90, 90d supply, fill #0

## 2024-06-07 ENCOUNTER — Ambulatory Visit (HOSPITAL_COMMUNITY)

## 2024-06-13 ENCOUNTER — Other Ambulatory Visit
